# Patient Record
Sex: Female | Born: 1945 | Race: White | Hispanic: No | Marital: Married | Smoking: Never smoker
Health system: Southern US, Community
[De-identification: ages and names within clinical notes are randomized; demographics above are authoritative.]

## PROBLEM LIST (undated history)

## (undated) DIAGNOSIS — Z923 Personal history of irradiation: Secondary | ICD-10-CM

## (undated) DIAGNOSIS — Z9221 Personal history of antineoplastic chemotherapy: Secondary | ICD-10-CM

## (undated) DIAGNOSIS — T07XXXA Unspecified multiple injuries, initial encounter: Secondary | ICD-10-CM

## (undated) DIAGNOSIS — F32A Depression, unspecified: Secondary | ICD-10-CM

## (undated) DIAGNOSIS — Z9289 Personal history of other medical treatment: Secondary | ICD-10-CM

## (undated) DIAGNOSIS — F329 Major depressive disorder, single episode, unspecified: Secondary | ICD-10-CM

## (undated) DIAGNOSIS — D509 Iron deficiency anemia, unspecified: Secondary | ICD-10-CM

## (undated) DIAGNOSIS — I2510 Atherosclerotic heart disease of native coronary artery without angina pectoris: Secondary | ICD-10-CM

## (undated) DIAGNOSIS — R2681 Unsteadiness on feet: Secondary | ICD-10-CM

## (undated) DIAGNOSIS — E7800 Pure hypercholesterolemia, unspecified: Secondary | ICD-10-CM

## (undated) DIAGNOSIS — R011 Cardiac murmur, unspecified: Secondary | ICD-10-CM

## (undated) DIAGNOSIS — Z8719 Personal history of other diseases of the digestive system: Secondary | ICD-10-CM

## (undated) DIAGNOSIS — M1990 Unspecified osteoarthritis, unspecified site: Secondary | ICD-10-CM

## (undated) DIAGNOSIS — R51 Headache: Secondary | ICD-10-CM

## (undated) DIAGNOSIS — K219 Gastro-esophageal reflux disease without esophagitis: Secondary | ICD-10-CM

## (undated) DIAGNOSIS — R519 Headache, unspecified: Secondary | ICD-10-CM

## (undated) DIAGNOSIS — C50911 Malignant neoplasm of unspecified site of right female breast: Secondary | ICD-10-CM

## (undated) DIAGNOSIS — Z972 Presence of dental prosthetic device (complete) (partial): Secondary | ICD-10-CM

## (undated) DIAGNOSIS — F419 Anxiety disorder, unspecified: Secondary | ICD-10-CM

## (undated) DIAGNOSIS — J189 Pneumonia, unspecified organism: Secondary | ICD-10-CM

## (undated) DIAGNOSIS — K8020 Calculus of gallbladder without cholecystitis without obstruction: Secondary | ICD-10-CM

## (undated) DIAGNOSIS — I700 Atherosclerosis of aorta: Secondary | ICD-10-CM

## (undated) DIAGNOSIS — I951 Orthostatic hypotension: Secondary | ICD-10-CM

## (undated) DIAGNOSIS — D649 Anemia, unspecified: Secondary | ICD-10-CM

## (undated) DIAGNOSIS — I200 Unstable angina: Secondary | ICD-10-CM

## (undated) DIAGNOSIS — Z973 Presence of spectacles and contact lenses: Secondary | ICD-10-CM

## (undated) DIAGNOSIS — I10 Essential (primary) hypertension: Secondary | ICD-10-CM

## (undated) DIAGNOSIS — B029 Zoster without complications: Secondary | ICD-10-CM

## (undated) DIAGNOSIS — I219 Acute myocardial infarction, unspecified: Secondary | ICD-10-CM

## (undated) DIAGNOSIS — K449 Diaphragmatic hernia without obstruction or gangrene: Secondary | ICD-10-CM

## (undated) HISTORY — DX: Atherosclerosis of aorta: I70.0

## (undated) HISTORY — DX: Iron deficiency anemia, unspecified: D50.9

## (undated) HISTORY — PX: TOTAL KNEE ARTHROPLASTY: SHX125

## (undated) HISTORY — DX: Diaphragmatic hernia without obstruction or gangrene: K44.9

## (undated) HISTORY — PX: CARDIAC CATHETERIZATION: SHX172

## (undated) HISTORY — DX: Unstable angina: I20.0

## (undated) HISTORY — PX: BREAST BIOPSY: SHX20

## (undated) HISTORY — PX: TONSILLECTOMY: SUR1361

---

## 1998-07-16 ENCOUNTER — Other Ambulatory Visit: Admission: RE | Admit: 1998-07-16 | Discharge: 1998-07-16 | Payer: Self-pay | Admitting: Family Medicine

## 1998-08-05 ENCOUNTER — Emergency Department (HOSPITAL_COMMUNITY): Admission: EM | Admit: 1998-08-05 | Discharge: 1998-08-05 | Payer: Self-pay | Admitting: Emergency Medicine

## 1998-08-05 ENCOUNTER — Encounter: Payer: Self-pay | Admitting: Emergency Medicine

## 1999-07-13 ENCOUNTER — Encounter: Admission: RE | Admit: 1999-07-13 | Discharge: 1999-07-29 | Payer: Self-pay | Admitting: Orthopedic Surgery

## 1999-12-30 ENCOUNTER — Encounter: Admission: RE | Admit: 1999-12-30 | Discharge: 1999-12-30 | Payer: Self-pay | Admitting: Family Medicine

## 1999-12-30 ENCOUNTER — Encounter: Payer: Self-pay | Admitting: Family Medicine

## 2001-01-22 ENCOUNTER — Encounter: Payer: Self-pay | Admitting: Family Medicine

## 2001-01-22 ENCOUNTER — Encounter: Admission: RE | Admit: 2001-01-22 | Discharge: 2001-01-22 | Payer: Self-pay | Admitting: Family Medicine

## 2001-10-31 ENCOUNTER — Encounter: Payer: Self-pay | Admitting: Family Medicine

## 2001-10-31 ENCOUNTER — Encounter: Admission: RE | Admit: 2001-10-31 | Discharge: 2001-10-31 | Payer: Self-pay | Admitting: Family Medicine

## 2001-11-05 ENCOUNTER — Encounter: Admission: RE | Admit: 2001-11-05 | Discharge: 2001-11-05 | Payer: Self-pay | Admitting: Family Medicine

## 2001-11-05 ENCOUNTER — Encounter: Payer: Self-pay | Admitting: Family Medicine

## 2002-01-04 ENCOUNTER — Ambulatory Visit (HOSPITAL_BASED_OUTPATIENT_CLINIC_OR_DEPARTMENT_OTHER): Admission: RE | Admit: 2002-01-04 | Discharge: 2002-01-04 | Payer: Self-pay | Admitting: Orthopedic Surgery

## 2002-01-15 ENCOUNTER — Encounter: Admission: RE | Admit: 2002-01-15 | Discharge: 2002-02-28 | Payer: Self-pay | Admitting: Orthopedic Surgery

## 2003-07-08 ENCOUNTER — Encounter: Admission: RE | Admit: 2003-07-08 | Discharge: 2003-07-08 | Payer: Self-pay | Admitting: Family Medicine

## 2003-08-01 ENCOUNTER — Encounter: Admission: RE | Admit: 2003-08-01 | Discharge: 2003-08-01 | Payer: Self-pay | Admitting: Family Medicine

## 2004-03-08 ENCOUNTER — Other Ambulatory Visit: Admission: RE | Admit: 2004-03-08 | Discharge: 2004-03-08 | Payer: Self-pay | Admitting: Family Medicine

## 2004-04-20 ENCOUNTER — Encounter: Admission: RE | Admit: 2004-04-20 | Discharge: 2004-04-20 | Payer: Self-pay | Admitting: Family Medicine

## 2005-10-05 ENCOUNTER — Encounter: Admission: RE | Admit: 2005-10-05 | Discharge: 2005-10-05 | Payer: Self-pay | Admitting: Family Medicine

## 2005-11-21 ENCOUNTER — Encounter: Admission: RE | Admit: 2005-11-21 | Discharge: 2005-11-21 | Payer: Self-pay | Admitting: Family Medicine

## 2006-11-04 ENCOUNTER — Emergency Department (HOSPITAL_COMMUNITY): Admission: EM | Admit: 2006-11-04 | Discharge: 2006-11-04 | Payer: Self-pay | Admitting: *Deleted

## 2008-03-12 ENCOUNTER — Encounter: Admission: RE | Admit: 2008-03-12 | Discharge: 2008-03-12 | Payer: Self-pay | Admitting: Family Medicine

## 2008-07-03 ENCOUNTER — Ambulatory Visit (HOSPITAL_COMMUNITY): Admission: RE | Admit: 2008-07-03 | Discharge: 2008-07-03 | Payer: Self-pay | Admitting: Family Medicine

## 2008-07-14 ENCOUNTER — Emergency Department (HOSPITAL_COMMUNITY): Admission: EM | Admit: 2008-07-14 | Discharge: 2008-07-15 | Payer: Self-pay | Admitting: Emergency Medicine

## 2009-08-23 ENCOUNTER — Emergency Department (HOSPITAL_COMMUNITY): Admission: EM | Admit: 2009-08-23 | Discharge: 2009-08-23 | Payer: Self-pay | Admitting: Family Medicine

## 2009-12-30 ENCOUNTER — Encounter: Admission: RE | Admit: 2009-12-30 | Discharge: 2009-12-30 | Payer: Self-pay | Admitting: Gastroenterology

## 2010-02-24 ENCOUNTER — Inpatient Hospital Stay (HOSPITAL_COMMUNITY): Admission: RE | Admit: 2010-02-24 | Discharge: 2010-02-28 | Payer: Self-pay | Admitting: Orthopedic Surgery

## 2010-06-10 ENCOUNTER — Ambulatory Visit (HOSPITAL_COMMUNITY)
Admission: RE | Admit: 2010-06-10 | Discharge: 2010-06-10 | Payer: Self-pay | Source: Home / Self Care | Attending: Family Medicine | Admitting: Family Medicine

## 2010-06-15 ENCOUNTER — Encounter
Admission: RE | Admit: 2010-06-15 | Discharge: 2010-06-15 | Payer: Self-pay | Source: Home / Self Care | Attending: Family Medicine | Admitting: Family Medicine

## 2010-07-28 LAB — BASIC METABOLIC PANEL
BUN: 5 mg/dL — ABNORMAL LOW (ref 6–23)
Calcium: 8.5 mg/dL (ref 8.4–10.5)
Creatinine, Ser: 0.72 mg/dL (ref 0.4–1.2)
Creatinine, Ser: 0.73 mg/dL (ref 0.4–1.2)
GFR calc non Af Amer: >60 mL/min (ref >60)
GFR calc non Af Amer: >60 mL/min (ref >60)
Glucose, Bld: 104 mg/dL — ABNORMAL HIGH (ref 70–99)
Sodium: 137 mEq/L (ref 135–145)

## 2010-07-28 LAB — CBC
Hemoglobin: 10.9 g/dL — ABNORMAL LOW (ref 12.0–15.0)
MCHC: 33.2 g/dL (ref 30.0–36.0)
MCV: 91.7 fL (ref 78.0–100.0)
Platelets: 159 10*3/uL (ref 150–400)
Platelets: 186 10*3/uL (ref 150–400)
RDW: 13.3 % (ref 11.5–15.5)
RDW: 13.8 % (ref 11.5–15.5)
WBC: 8.3 10*3/uL (ref 4.0–10.5)

## 2010-07-29 LAB — BASIC METABOLIC PANEL
BUN: 7 mg/dL (ref 6–23)
CO2: 27 mEq/L (ref 19–32)
CO2: 30 mEq/L (ref 19–32)
Chloride: 100 mEq/L (ref 96–112)
Chloride: 104 mEq/L (ref 96–112)
Creatinine, Ser: 0.77 mg/dL (ref 0.4–1.2)
Creatinine, Ser: 0.79 mg/dL (ref 0.4–1.2)
GFR calc Af Amer: >60 mL/min (ref >60)
Glucose, Bld: 140 mg/dL — ABNORMAL HIGH (ref 70–99)
Sodium: 141 mEq/L (ref 135–145)

## 2010-07-29 LAB — CBC
Hemoglobin: 9.4 g/dL — ABNORMAL LOW (ref 12.0–15.0)
MCH: 28.9 pg (ref 26.0–34.0)
MCH: 29 pg (ref 26.0–34.0)
MCHC: 32.4 g/dL (ref 30.0–36.0)
MCV: 89.2 fL (ref 78.0–100.0)
MCV: 89.2 fL (ref 78.0–100.0)
MCV: 90.4 fL (ref 78.0–100.0)
Platelets: 182 10*3/uL (ref 150–400)
Platelets: 227 10*3/uL (ref 150–400)
RBC: 3.24 MIL/uL — ABNORMAL LOW (ref 3.87–5.11)
RBC: 3.89 MIL/uL (ref 3.87–5.11)
RDW: 13.3 % (ref 11.5–15.5)
WBC: 8.3 10*3/uL (ref 4.0–10.5)

## 2010-07-29 LAB — URINE MICROSCOPIC-ADD ON

## 2010-07-29 LAB — TYPE AND SCREEN
Antibody Screen: NEGATIVE
Unit division: 0

## 2010-07-29 LAB — URINALYSIS, ROUTINE W REFLEX MICROSCOPIC
Protein, ur: NEGATIVE mg/dL
Urobilinogen, UA: 0.2 mg/dL (ref 0.0–1.0)

## 2010-07-29 LAB — SURGICAL PCR SCREEN: Staphylococcus aureus: POSITIVE — AB

## 2010-07-29 LAB — COMPREHENSIVE METABOLIC PANEL
AST: 18 U/L (ref 0–37)
Albumin: 3.7 g/dL (ref 3.5–5.2)
Calcium: 8.8 mg/dL (ref 8.4–10.5)
Creatinine, Ser: 0.93 mg/dL (ref 0.4–1.2)
GFR calc Af Amer: >60 mL/min (ref >60)
Total Protein: 7.3 g/dL (ref 6.0–8.3)

## 2010-07-29 LAB — APTT: aPTT: 29 seconds (ref 24–37)

## 2010-08-04 LAB — URINE CULTURE

## 2010-08-04 LAB — POCT URINALYSIS DIP (DEVICE)
Protein, ur: NEGATIVE mg/dL
Specific Gravity, Urine: 1.015 (ref 1.005–1.030)
Urobilinogen, UA: 0.2 mg/dL (ref 0.0–1.0)

## 2010-10-01 NOTE — Op Note (Signed)
NAME:  Angela Bond, Angela Bond                           ACCOUNT NO.:  [id number]   MEDICAL RECORD NO.:  [id number]                   PATIENT TYPE:  AMB   LOCATION:  DSC                                  FACILITY:  [facility]   PHYSICIAN:  Loreta Ave, M.D.              DATE OF BIRTH:  [birth year]   DATE OF PROCEDURE:  01/04/2002  DATE OF DISCHARGE:                                 OPERATIVE REPORT   PREOPERATIVE DIAGNOSIS:  Medial meniscus tear with degenerative arthritis  and synovitis, left knee.   POSTOPERATIVE DIAGNOSES:  1. Medial meniscus tear with degenerative arthritis and synovitis, left     knee.  2. Grade 3 chondromalacia of the medial femoral condyle, also grade 3     changes, patellofemoral joint, mainly on the trochlea.   PROCEDURES:  1. Left knee examination under anesthesia, arthroscopy, with chondroplasty     of the medial femoral condyle, patellofemoral joint.  2. Extensive synovectomy.  3. Partial medial meniscectomy.   SURGEON:  Loreta Ave, M.D.   ASSISTANT:  Arlys John D. Petrarca, P.A.-C.   ANESTHESIA:  Knee block with sedation.   SPECIMENS:  None.   CULTURES:  None.   COMPLICATIONS:  None.   DRESSING:  Soft compressive.   DESCRIPTION OF PROCEDURE:  The patient brought to the operating room and  placed on the operating table in supine position.  After adequate anesthesia  had been obtained, left knee examined.  Full motion and good stability but  with patellofemoral tracking and obvious swelling and synovitis.  Tourniquet  and leg holder applied.  Leg prepped and draped in the usual sterile  fashion.  Three portals created, one superolateral, one each medial and  lateral parapatellar.  Inflow catheter introduced, knee distended,  arthroscope introduced, knee inspected.  Marked synovitis extending halfway  across the patellofemoral joint.  All of this resected.  Numerous chondral  loose bodies removed.  The patella had reasonable tracking, grade 2  changes  on the patella, but extensive grade 3 changes throughout the trochlea.  Flaps of cartilage debrided down to a stable surface.  Approaching grade 4  in the middle of the trochlea but good tracking.  Medial compartment  extensive degenerative tearing, posterior half of medial meniscus with  numerous displaced fragments.  Saucerized out, leaving a little bit of the  rim in the back __________  and into remaining meniscus.  Marked grade 3  changes over the entire weightbearing dome, medial femoral condyle, partial-  thickness.  Chondroplasty to a stable surface.  Lateral meniscus, lateral  compartment, cruciate ligaments normal.  The entire knee inspected and no  other findings appreciated.  Care taken to remove all chondral loose bodies.  Instruments and fluid removed.  Portals and the knee injected with Marcaine.  Portals closed with 4-0 nylon.  Sterile compressive dressing applied.  Anesthesia reversed, brought to the recovery room.  Tolerated the surgery  well with no complications.                                                Loreta Ave, M.D.    DFM/MEDQ  D:  01/04/2002  T:  01/08/2002  Job:  [id number]

## 2010-12-16 ENCOUNTER — Encounter (HOSPITAL_COMMUNITY)
Admission: RE | Admit: 2010-12-16 | Discharge: 2010-12-16 | Disposition: A | Discharge status: Home / Self Care | Payer: Medicare Other | Source: Ambulatory Visit | Attending: Orthopedic Surgery | Admitting: Orthopedic Surgery

## 2010-12-16 LAB — CBC
Hemoglobin: 11.6 g/dL — ABNORMAL LOW (ref 12.0–15.0)
MCH: 30 pg (ref 26.0–34.0)
RBC: 3.87 MIL/uL (ref 3.87–5.11)

## 2010-12-16 LAB — URINALYSIS, ROUTINE W REFLEX MICROSCOPIC
Glucose, UA: NEGATIVE mg/dL
Specific Gravity, Urine: 1.008 (ref 1.005–1.030)
pH: 6 (ref 5.0–8.0)

## 2010-12-16 LAB — COMPREHENSIVE METABOLIC PANEL
ALT: 15 U/L (ref 0–35)
Albumin: 3.5 g/dL (ref 3.5–5.2)
Alkaline Phosphatase: 75 U/L (ref 39–117)
GFR calc Af Amer: >60 mL/min (ref >60)
Glucose, Bld: 85 mg/dL (ref 70–99)
Potassium: 3.7 mEq/L (ref 3.5–5.1)
Sodium: 140 mEq/L (ref 135–145)
Total Protein: 7.3 g/dL (ref 6.0–8.3)

## 2010-12-16 LAB — SURGICAL PCR SCREEN: MRSA, PCR: POSITIVE — AB

## 2010-12-16 LAB — URINE MICROSCOPIC-ADD ON

## 2010-12-16 LAB — PROTIME-INR: Prothrombin Time: 13.7 seconds (ref 11.6–15.2)

## 2010-12-22 ENCOUNTER — Inpatient Hospital Stay (HOSPITAL_COMMUNITY): Payer: Medicare Other

## 2010-12-22 ENCOUNTER — Inpatient Hospital Stay (HOSPITAL_COMMUNITY)
Admission: RE | Admit: 2010-12-22 | Discharge: 2010-12-25 | DRG: 470 | Disposition: A | Discharge status: Home Health | Payer: Medicare Other | Source: Ambulatory Visit | Attending: Orthopedic Surgery | Admitting: Orthopedic Surgery

## 2010-12-22 DIAGNOSIS — I10 Essential (primary) hypertension: Secondary | ICD-10-CM | POA: Diagnosis present

## 2010-12-22 DIAGNOSIS — D62 Acute posthemorrhagic anemia: Secondary | ICD-10-CM | POA: Diagnosis not present

## 2010-12-22 DIAGNOSIS — E876 Hypokalemia: Secondary | ICD-10-CM | POA: Diagnosis not present

## 2010-12-22 DIAGNOSIS — Z01812 Encounter for preprocedural laboratory examination: Secondary | ICD-10-CM

## 2010-12-22 DIAGNOSIS — M1710 Unilateral primary osteoarthritis, unspecified knee: Principal | ICD-10-CM | POA: Diagnosis present

## 2010-12-22 DIAGNOSIS — K219 Gastro-esophageal reflux disease without esophagitis: Secondary | ICD-10-CM | POA: Diagnosis present

## 2010-12-22 LAB — URINALYSIS, ROUTINE W REFLEX MICROSCOPIC
Bilirubin Urine: NEGATIVE
Bilirubin Urine: NEGATIVE
Glucose, UA: NEGATIVE mg/dL
Hgb urine dipstick: NEGATIVE
Ketones, ur: NEGATIVE mg/dL
Protein, ur: NEGATIVE mg/dL
Urobilinogen, UA: 1 mg/dL (ref 0.0–1.0)
pH: 6 (ref 5.0–8.0)

## 2010-12-22 LAB — URINE MICROSCOPIC-ADD ON

## 2010-12-23 LAB — BASIC METABOLIC PANEL
BUN: 7 mg/dL (ref 6–23)
CO2: 29 mEq/L (ref 19–32)
Chloride: 104 mEq/L (ref 96–112)
Creatinine, Ser: 0.62 mg/dL (ref 0.50–1.10)
Glucose, Bld: 106 mg/dL — ABNORMAL HIGH (ref 70–99)

## 2010-12-23 LAB — HEMOGLOBIN AND HEMATOCRIT, BLOOD
HCT: 29.2 % — ABNORMAL LOW (ref 36.0–46.0)
Hemoglobin: 9.6 g/dL — ABNORMAL LOW (ref 12.0–15.0)

## 2010-12-23 LAB — CBC
HCT: 26.6 % — ABNORMAL LOW (ref 36.0–46.0)
Hemoglobin: 8.7 g/dL — ABNORMAL LOW (ref 12.0–15.0)
MCH: 29.9 pg (ref 26.0–34.0)
MCHC: 32.7 g/dL (ref 30.0–36.0)
MCV: 91.4 fL (ref 78.0–100.0)

## 2010-12-23 LAB — URINE CULTURE
Colony Count: 70000
Colony Count: NO GROWTH

## 2010-12-23 NOTE — Op Note (Signed)
Angela Bond, LEOPARD NO.:  [id number]  MEDICAL RECORD NO.:  [id number]  LOCATION:  5009                         FACILITY:  [facility]  PHYSICIAN:  Loreta Ave, M.D. DATE OF BIRTH:  [birth year]  DATE OF PROCEDURE:  12/22/2010 DATE OF DISCHARGE:                              OPERATIVE REPORT   PREOPERATIVE DIAGNOSIS:  Left knee end-stage degenerative arthritis, varus alignment.  POSTOPERATIVE DIAGNOSIS:  Left knee end-stage degenerative arthritis, varus alignment.  PROCEDURE:  Left knee modified minimally invasive total knee replacement with Stryker triathlon prosthesis.  Soft tissue balancing.  Cemented and pegged posterior stabilized #3 femoral component.  Cemented #3 tibial component and 9-mm polyethylene insert.  Resurfacing 29-mm patellar component.  SURGEON:  Loreta Ave, MD  ASSISTANT:  Zonia Kief, PA, present throughout the entire case necessary for timely completion of procedure.  ANESTHESIA:  General.  BLOOD LOSS:  Minimal.  SPECIMENS:  None.  CULTURES:  None.  COMPLICATIONS:  None.  DRESSINGS:  Soft compressive knee immobilizer.  DRAIN:  Hemovac x1.  TOURNIQUET TIME:  One hour.  DESCRIPTION OF PROCEDURE:  The patient was brought to the operating room, placed on the operating table in supine position.  After adequate anesthesia had been obtained, left knee examined.  Mild flexion contracture, varus alignment flexion 90 degrees.  Tourniquet applied. Prepped and draped in usual sterile fashion.  Exsanguinated with elevation and Esmarch, tourniquet inflated to 350 mmHg.  Straight incision above the patella down to the tibial tubercle.  Skin and subcutaneous tissue divided.  Medial arthrotomy vastus splitting preserving quad tendon.  Hemostasis with cautery.  Knee exposed.  Grade 4 change throughout.  Medial capsule release.  Remnants of menisci, cruciate ligaments, loose body spurs removed.  Intramedullary guide on the femur.  A  10-mm resection set at 5 degrees of valgus.  Using epicondylar axis, the femur size cut and fitted for posterior stabilized peg #3 component.  Attention turned to the tibia.  Extramedullary guide. A 3-degree posterior slope cut.  Resection below the defect medially. Once the cuts were complete, all recesses were cleared including posteriorly.  A wound irrigated.  Patella exposed posterior 9 mm removed.  Drilled sized and fitted for a 29-mm component.  Trials put in place.  #3 above and below, #9 insert, and a 29-mm patella.  Nicely balanced knee in flexion/extension.  Good mechanical axis.  Full motion. Good tracking.  Patella tracking was very pleased with.  Tibia was marked for rotation.  Trials were removed.  Tibia and hand reamed. Thoroughly irrigated with pulse lavage.  Cement prepared, placed on all components firmly seated.  Polyethylene attached to tibia knee reduced. Patella with a clamp.  Once cement hardened, the wound was irrigated again.  Hemovac placed, brought out through a separate stab wound. Arthrotomy closed #1 Vicryl.  Skin and subcutaneous tissue with Vicryl and staples.  Sterile compressive dressing applied.  Tourniquet de- inflated and was removed.  Knee immobilizer applied.  Anesthesia reversed.  Brought to recovery room.  Tolerated the surgery well.  There were no complications.     Loreta Ave, M.D.     DFM/MEDQ  D:  12/23/2010  T:  12/23/2010  Job:  [id number]  Electronically Signed by Mckinley Jewel M.D. on 12/23/2010 03:42:55 PM

## 2010-12-24 LAB — CBC
MCV: 91.8 fL (ref 78.0–100.0)
Platelets: 181 10*3/uL (ref 150–400)
RBC: 3.04 MIL/uL — ABNORMAL LOW (ref 3.87–5.11)
WBC: 9.4 10*3/uL (ref 4.0–10.5)

## 2010-12-24 LAB — BASIC METABOLIC PANEL
GFR calc Af Amer: >60 mL/min (ref >60)
GFR calc non Af Amer: >60 mL/min (ref >60)
Glucose, Bld: 124 mg/dL — ABNORMAL HIGH (ref 70–99)
Potassium: 4 mEq/L (ref 3.5–5.1)
Sodium: 138 mEq/L (ref 135–145)

## 2010-12-25 LAB — PROTIME-INR
INR: 1.58 — ABNORMAL HIGH (ref 0.00–1.49)
Prothrombin Time: 19.2 seconds — ABNORMAL HIGH (ref 11.6–15.2)

## 2010-12-25 LAB — BASIC METABOLIC PANEL
Chloride: 102 mEq/L (ref 96–112)
GFR calc Af Amer: >60 mL/min (ref >60)
Potassium: 3.6 mEq/L (ref 3.5–5.1)
Sodium: 139 mEq/L (ref 135–145)

## 2010-12-25 LAB — CBC
Hemoglobin: 8.9 g/dL — ABNORMAL LOW (ref 12.0–15.0)
RBC: 3 MIL/uL — ABNORMAL LOW (ref 3.87–5.11)

## 2011-03-02 LAB — CBC
HCT: 36.2
Hemoglobin: 12.3
WBC: 7.2

## 2011-03-02 LAB — I-STAT 8, (EC8 V) (CONVERTED LAB)
Acid-Base Excess: 5 — ABNORMAL HIGH
Bicarbonate: 30.1 — ABNORMAL HIGH
Chloride: 103
HCT: 39
Operator id: 279831
TCO2: 31
pCO2, Ven: 45.4
pH, Ven: 7.429 — ABNORMAL HIGH

## 2011-03-02 LAB — DIFFERENTIAL
Eosinophils Relative: 3
Lymphocytes Relative: 43
Lymphs Abs: 3
Monocytes Absolute: 0.5

## 2011-03-02 LAB — POCT CARDIAC MARKERS: CKMB, poc: <1 — ABNORMAL LOW

## 2011-03-02 LAB — URINALYSIS, ROUTINE W REFLEX MICROSCOPIC
Bilirubin Urine: NEGATIVE
Glucose, UA: NEGATIVE
Ketones, ur: NEGATIVE
Specific Gravity, Urine: 1.009
pH: 7.5

## 2011-03-02 LAB — URINE MICROSCOPIC-ADD ON

## 2011-03-02 LAB — POCT I-STAT CREATININE
Creatinine, Ser: 0.7
Operator id: 279831

## 2011-03-02 LAB — URINE CULTURE

## 2011-03-02 NOTE — Discharge Summary (Signed)
NAMEJUDIT, AWAD NO.:  [id number]  MEDICAL RECORD NO.:  [id number]  LOCATION:  5009                         FACILITY:  [facility]  PHYSICIAN:  Loreta Ave, M.D. DATE OF BIRTH:  December 11, 1945  DATE OF ADMISSION:  12/22/2010 DATE OF DISCHARGE:  12/25/2010                              DISCHARGE SUMMARY   FINAL DIAGNOSES: 1. 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 2. Hypertension. 3. Hypercholesterolemia. 4. Osteopenia. 5. Gastroesophageal reflux disease. 6. Depression/anxiety.  HISTORY OF PRESENT ILLNESS:  A 65 year old white female with history of end-stage DJD, left knee and chronic pain presented to our office for preop evaluation for left total knee replacement.  She had progressively worsening pain with failure to response with conservative treatment. Significant decrease in her daily activities due to the ongoing complaint.  HOSPITAL COURSE:  On December 21, 2010, the patient was taken to the [facility] OR and a left total knee replacement procedure was performed.  SURGEON:  Mckinley Jewel, MD  ASSISTANT:  Zonia Kief PA-C.  ANESTHESIA:  General with femoral nerve block.  ESTIMATED BLOOD LOSS:  Minimal.  SPECIMENS:  None.  CULTURES:  None.  TOURNIQUET TIME:  One hour.  One Hemovac drain placed.  There were no surgical or anesthesia complications and the patient was transferred to recovery in stable condition.  After recovering in PACU, the patient was then transferred to the Orthopedic Unit and pharmacy protocol Coumadin and Lovenox started for DVT prophylaxis.  On December 23, 2010, the patient complained of left knee pain.  Denied chest pain, shortness of breath.  Temperature 100.1, pulse 83, respirations 18, blood pressure 113/53.  Hemoglobin 8.7, potassium 2.8, INR 1.26.  Left knee dressing clean, dry, intact.  Calf nontender and neurovascularly intact.  Skin warm and dry.  Discontinued  Dilaudid IV due to some issues with oversedation, previous surgery.  Started Norco 5/325, 1 tablet p.o. q.4 h. p.r.n. for pain.  Replace potassium.  Recheck H and H later in the afternoon.  PT, OT consults.  On December 24, 2010, the patient doing well with left knee pain better.  Temperature 99.7, pulse 89, respirations 18, blood pressure 123/67.  Hemoglobin 9.1, sodium 138, potassium 4.0, chloride 106, CO2 27, BUN 6, creatinine 0.53, glucose 124, INR 1.46.  Preop urine culture showed no growth.  Left knee wound looks good and staples intact.  No drainage or signs of infection. Hemovac drain removed.  Saline locked IV.  December 25, 2010, the patient doing much better and progressing with therapy.  Hemoglobin 8.9, hematocrit 27.5, INR 1.58.  Knee wound looks good and staples intact. No drainage or signs of infection.  Calf nontender and neurovascularly intact.  She has progressed well with therapy and now ready to discharge home.  DISCHARGE MEDICATIONS: 1. Norco 7.5/325, 1-2 tabs p.o. q.4-6 h. p.r.n. for pain. 2. Robaxin 500 mg 1 tablet p.o. q.6 h. p.r.n. for spasms. 3. Lovenox 30 mg 1 subcu injection q.12 h. and stop when Coumadin is     therapeutic with INR 2-3. 4. Coumadin pharmacy protocol per home health agency.  CONDITION:  Good and stable.  DISPOSITION:  Discharged home.  INSTRUCTIONS:  The patient will continue to work with home health PT and OT to improve ambulation and knee range of motion and strengthening. Weight bear as tolerated.  Daily dressing changes with 4x4 gauze and apply TED hose over this.  She is okay to shower but no tub soaking. Can wean off walker to single prong cane as tolerated.  Coumadin x4 weeks postop DVT prophylaxis.  Follow up when she is 2 weeks postop for recheck and we will remove staples at that time.     Genene Churn. Denton Meek.   ______________________________ Loreta Ave, M.D.    JMO/MEDQ  D:  01/10/2011  T:  01/10/2011  Job:   [id number]  Electronically Signed by Zonia Kief P.A. on 02/23/2011 03:37:56 PM Electronically Signed by Mckinley Jewel M.D. on 03/02/2011 02:03:18 PM

## 2011-04-28 DIAGNOSIS — H04542 Stenosis of left lacrimal canaliculi: Secondary | ICD-10-CM | POA: Insufficient documentation

## 2011-04-28 DIAGNOSIS — F419 Anxiety disorder, unspecified: Secondary | ICD-10-CM | POA: Insufficient documentation

## 2011-05-17 HISTORY — PX: EYE SURGERY: SHX253

## 2011-07-26 ENCOUNTER — Other Ambulatory Visit: Payer: Self-pay | Admitting: Otolaryngology

## 2011-07-26 DIAGNOSIS — H93A9 Pulsatile tinnitus, unspecified ear: Secondary | ICD-10-CM

## 2011-08-03 ENCOUNTER — Ambulatory Visit
Admission: RE | Admit: 2011-08-03 | Discharge: 2011-08-03 | Disposition: A | Discharge status: Home / Self Care | Payer: Medicare Other | Source: Ambulatory Visit | Attending: Otolaryngology | Admitting: Otolaryngology

## 2011-08-03 DIAGNOSIS — H93A9 Pulsatile tinnitus, unspecified ear: Secondary | ICD-10-CM

## 2011-08-03 MED ORDER — GADOBENATE DIMEGLUMINE 529 MG/ML IV SOLN
16.0000 mL | Freq: Once | INTRAVENOUS | Status: AC | PRN
  Administered 2011-08-03: 16 mL via INTRAVENOUS

## 2012-01-13 ENCOUNTER — Other Ambulatory Visit: Payer: Self-pay | Admitting: Cardiology

## 2012-01-14 ENCOUNTER — Emergency Department (HOSPITAL_COMMUNITY)
Admission: EM | Admit: 2012-01-14 | Discharge: 2012-01-14 | Disposition: A | Discharge status: Home / Self Care | Payer: Medicare Other | Attending: Emergency Medicine | Admitting: Emergency Medicine

## 2012-01-14 ENCOUNTER — Emergency Department (HOSPITAL_COMMUNITY): Payer: Medicare Other

## 2012-01-14 ENCOUNTER — Encounter (HOSPITAL_COMMUNITY): Payer: Self-pay | Admitting: Adult Health

## 2012-01-14 DIAGNOSIS — R10819 Abdominal tenderness, unspecified site: Secondary | ICD-10-CM | POA: Insufficient documentation

## 2012-01-14 DIAGNOSIS — I10 Essential (primary) hypertension: Secondary | ICD-10-CM | POA: Insufficient documentation

## 2012-01-14 DIAGNOSIS — K449 Diaphragmatic hernia without obstruction or gangrene: Secondary | ICD-10-CM

## 2012-01-14 DIAGNOSIS — Z79899 Other long term (current) drug therapy: Secondary | ICD-10-CM | POA: Insufficient documentation

## 2012-01-14 DIAGNOSIS — E7800 Pure hypercholesterolemia, unspecified: Secondary | ICD-10-CM | POA: Insufficient documentation

## 2012-01-14 DIAGNOSIS — R109 Unspecified abdominal pain: Secondary | ICD-10-CM

## 2012-01-14 DIAGNOSIS — R197 Diarrhea, unspecified: Secondary | ICD-10-CM

## 2012-01-14 HISTORY — DX: Gastro-esophageal reflux disease without esophagitis: K21.9

## 2012-01-14 HISTORY — DX: Unspecified osteoarthritis, unspecified site: M19.90

## 2012-01-14 HISTORY — DX: Pure hypercholesterolemia, unspecified: E78.00

## 2012-01-14 HISTORY — DX: Essential (primary) hypertension: I10

## 2012-01-14 LAB — CBC WITH DIFFERENTIAL/PLATELET
Basophils Absolute: 0 10*3/uL (ref 0.0–0.1)
Eosinophils Relative: 1 % (ref 0–5)
Lymphocytes Relative: 24 % (ref 12–46)
Neutro Abs: 7.6 10*3/uL (ref 1.7–7.7)
Neutrophils Relative %: 68 % (ref 43–77)
Platelets: 216 10*3/uL (ref 150–400)
RBC: 4.2 MIL/uL (ref 3.87–5.11)
RDW: 13.8 % (ref 11.5–15.5)
WBC: 11.1 10*3/uL — ABNORMAL HIGH (ref 4.0–10.5)

## 2012-01-14 LAB — URINALYSIS, ROUTINE W REFLEX MICROSCOPIC
Nitrite: NEGATIVE
Protein, ur: NEGATIVE mg/dL
Specific Gravity, Urine: 1.015 (ref 1.005–1.030)
Urobilinogen, UA: 0.2 mg/dL (ref 0.0–1.0)

## 2012-01-14 LAB — HEPATIC FUNCTION PANEL
ALT: 13 U/L (ref 0–35)
AST: 19 U/L (ref 0–37)
Albumin: 3.5 g/dL (ref 3.5–5.2)
Alkaline Phosphatase: 71 U/L (ref 39–117)
Total Bilirubin: 0.3 mg/dL (ref 0.3–1.2)
Total Protein: 7.6 g/dL (ref 6.0–8.3)

## 2012-01-14 LAB — BASIC METABOLIC PANEL
CO2: 28 mEq/L (ref 19–32)
Calcium: 9.4 mg/dL (ref 8.4–10.5)
Chloride: 101 mEq/L (ref 96–112)
Potassium: 3.4 mEq/L — ABNORMAL LOW (ref 3.5–5.1)
Sodium: 140 mEq/L (ref 135–145)

## 2012-01-14 LAB — URINE MICROSCOPIC-ADD ON

## 2012-01-14 LAB — OCCULT BLOOD, POC DEVICE: Fecal Occult Bld: NEGATIVE

## 2012-01-14 MED ORDER — TRAMADOL HCL 50 MG PO TABS: 50.0000 mg | ORAL_TABLET | Freq: Four times a day (QID) | ORAL | Status: AC | PRN

## 2012-01-14 MED ORDER — IOHEXOL 300 MG/ML  SOLN: 20.0000 mL | INTRAMUSCULAR | Status: AC

## 2012-01-14 MED ORDER — DIPHENOXYLATE-ATROPINE 2.5-0.025 MG PO TABS: 1.0000 | ORAL_TABLET | Freq: Four times a day (QID) | ORAL | Status: AC | PRN

## 2012-01-14 MED ORDER — IOHEXOL 300 MG/ML  SOLN
100.0000 mL | Freq: Once | INTRAMUSCULAR | Status: AC | PRN
  Administered 2012-01-14: 100 mL via INTRAVENOUS

## 2012-01-14 MED ORDER — ONDANSETRON HCL 4 MG/2ML IJ SOLN
4.0000 mg | Freq: Once | INTRAMUSCULAR | Status: AC
  Administered 2012-01-14: 4 mg via INTRAVENOUS
  Filled 2012-01-14: qty 2

## 2012-01-14 MED ORDER — SODIUM CHLORIDE 0.9 % IV SOLN
Freq: Once | INTRAVENOUS | Status: AC
  Administered 2012-01-14: 1000 mL via INTRAVENOUS

## 2012-01-14 MED ORDER — MORPHINE SULFATE 4 MG/ML IJ SOLN
2.0000 mg | Freq: Once | INTRAMUSCULAR | Status: AC
  Administered 2012-01-14: 2 mg via INTRAVENOUS
  Filled 2012-01-14: qty 1

## 2012-01-14 NOTE — ED Notes (Signed)
Pt reports diarrhea, nausea, vomiting and abdominal pain that began this evening. C/o not feeling well for 2 days but tonight began the vomiting and diarrhea and pain. Reports abdominal distention and pain is worse in upper left quadrant.

## 2012-01-14 NOTE — ED Notes (Signed)
Pt c/o abdominal generalized pain onset 1400hrs 8.30.13 noted to be increased with movement, constant, throbbing associated with weakness, nausea and vomiting times two, loose stool times one.  Denies fever, chills, urinary symptoms.

## 2012-01-14 NOTE — ED Provider Notes (Signed)
History     CSN: [id number]  Arrival date & time 01/14/12  0304   First MD Initiated Contact with Patient 01/14/12 [phone]      Chief Complaint  Patient presents with  . Abdominal Pain    (Consider location/radiation/quality/duration/timing/severity/associated sxs/prior treatment) HPI Comments: 66 y/o female with hx of htn, GERD who presents with n/v/d X 1 day - states that she has felt generalized weakness X 3 days but in last 24 hours has had 2 episodes of vomiting followed by one episode of loose dark colored stools.  She denies rectal bleeding, back pain, fevers, chills, swelling, rashes. She has no history of abdominal surgery and denies use of anti-inflammatory medications.  Patient is a 66 y.o. female presenting with abdominal pain. The history is provided by the patient.  Abdominal Pain The primary symptoms of the illness include abdominal pain.    Past Medical History  Diagnosis Date  . Arthritis   . Hypertension   . GERD (gastroesophageal reflux disease)   . Hypercholesteremia     Past Surgical History  Procedure Date  . Total knee arthroplasty     History reviewed. No pertinent family history.  History  Substance Use Topics  . Smoking status: Never Smoker   . Smokeless tobacco: Not on file  . Alcohol Use: No    OB History    Grav Para Term Preterm Abortions TAB SAB Ect Mult Living                  Review of Systems  Gastrointestinal: Positive for abdominal pain.  All other systems reviewed and are negative.    Allergies  Review of patient's allergies indicates no known allergies.  Home Medications   Current Outpatient Rx  Name Route Sig Dispense Refill  . CALCIUM CARBONATE 600 MG PO TABS Oral Take 600 mg by mouth 2 (two) times daily with a meal.    . VITAMIN D 2000 UNITS PO TABS Oral Take 2,000 Units by mouth daily.    Marland Kitchen CITALOPRAM HYDROBROMIDE 40 MG PO TABS Oral Take 40 mg by mouth daily.    . ETODOLAC 500 MG PO TABS Oral Take 500 mg by mouth  2 (two) times daily.    Marland Kitchen FEXOFENADINE HCL 180 MG PO TABS Oral Take 180 mg by mouth daily.    Marland Kitchen LISINOPRIL 20 MG PO TABS Oral Take 20 mg by mouth daily.    Marland Kitchen LOVASTATIN 40 MG PO TABS Oral Take 40 mg by mouth at bedtime.    . OMEPRAZOLE 40 MG PO CPDR Oral Take 40 mg by mouth daily.    . RED YEAST RICE PO Oral Take 1 tablet by mouth daily.    Marland Kitchen DIPHENOXYLATE-ATROPINE 2.5-0.025 MG PO TABS Oral Take 1 tablet by mouth 4 (four) times daily as needed for diarrhea or loose stools. 30 tablet 0  . TRAMADOL HCL 50 MG PO TABS Oral Take 1 tablet (50 mg total) by mouth every 6 (six) hours as needed for pain. 15 tablet 0    BP 130/72  Pulse 53  Temp 98.9 F (37.2 C) (Oral)  Resp 16  SpO2 98%  Physical Exam  Nursing note and vitals reviewed. Constitutional: She appears well-developed and well-nourished. No distress.  HENT:  Head: Normocephalic and atraumatic.  Mouth/Throat: Oropharynx is clear and moist. No oropharyngeal exudate.  Eyes: Conjunctivae and EOM are normal. Pupils are equal, round, and reactive to light. Right eye exhibits no discharge. Left eye exhibits no discharge. No scleral  icterus.  Neck: Normal range of motion. Neck supple. No JVD present. No thyromegaly present.  Cardiovascular: Normal rate, regular rhythm, normal heart sounds and intact distal pulses.  Exam reveals no gallop and no friction rub.   No murmur heard. Pulmonary/Chest: Effort normal and breath sounds normal. No respiratory distress. She has no wheezes. She has no rales.  Abdominal: Soft. Bowel sounds are normal. She exhibits no distension and no mass. There is tenderness ( Focal tenderness to palpation in the epigastrium and left upper quadrant, no peritoneal signs, no pain in the lower abdomen.).  Genitourinary:       Chaperone present for rectal exam, normal rectal tone, no fissures, no hemorrhoids, normal brown stool in the rectal vault.  Musculoskeletal: Normal range of motion. She exhibits no edema and no  tenderness.  Lymphadenopathy:    She has no cervical adenopathy.  Neurological: She is alert. Coordination normal.  Skin: Skin is warm and dry. No rash noted. No erythema.  Psychiatric: She has a normal mood and affect. Her behavior is normal.    ED Course  Procedures (including critical care time)  Labs Reviewed  CBC WITH DIFFERENTIAL - Abnormal; Notable for the following:    WBC 11.1 (*)     All other components within normal limits  BASIC METABOLIC PANEL - Abnormal; Notable for the following:    Potassium 3.4 (*)     Glucose, Bld 103 (*)     GFR calc non Af Amer 86 (*)     All other components within normal limits  URINALYSIS, ROUTINE W REFLEX MICROSCOPIC - Abnormal; Notable for the following:    APPearance CLOUDY (*)     Bilirubin Urine LARGE (*)     Leukocytes, UA MODERATE (*)     All other components within normal limits  URINE MICROSCOPIC-ADD ON - Abnormal; Notable for the following:    Squamous Epithelial / LPF FEW (*)     Bacteria, UA FEW (*)     Casts HYALINE CASTS (*)     All other components within normal limits  OCCULT BLOOD, POC DEVICE  LIPASE, BLOOD  HEPATIC FUNCTION PANEL   Ct Abdomen Pelvis W Contrast  01/14/2012  *RADIOLOGY REPORT*  Clinical Data: Abdominal pain, more in the left upper quadrant. Vomiting and diarrhea.  CT ABDOMEN AND PELVIS WITH CONTRAST  Technique:  Multidetector CT imaging of the abdomen and pelvis was performed following the standard protocol during bolus administration of intravenous contrast.  Contrast: OMNIPAQUE IOHEXOL 300 MG/ML  SOLN 11/21/2005.  Comparison: 11/21/2005.  Findings: A large hiatal hernia is again demonstrated.  This has increased significantly in size, currently containing the entire stomach as well as bowel loops without obstruction.  Small probable cyst in the inferior aspect of the spleen. Unremarkable liver, gallbladder, adrenal glands, kidneys, urinary bladder, uterus and ovaries.  No enlarged lymph nodes.  There  is some coarse calcification in the tail of the pancreas without a visible mass.  This is less prominent than on the previous examination.  The lung bases are clear.  Mild lumbar and lower thoracic spine degenerative changes.  IMPRESSION:  1.  No acute abnormality. 2.  Very large hiatal hernia containing the entire stomach as well as bowel loops, without obstruction.   Original Report Authenticated By: Darrol Angel, M.D.      1. Abdominal pain   2. Hiatal hernia   3. Diarrhea       MDM  Overall the patient appears in no acute  distress though she does have significant tenderness in her upper abdomen. Her stool does not appear to be grossly bloody, check Hemoccults, evaluate for pancreatitis, cholecystitis with laboratory data as well as a CT scan to rule out other sources of pain and mild distention such as obstruction.   Reevaluation, the patient's abdomen is soft, minimally tender in the upper abdomen, labs have been reviewed showing no significant findings other than mild leukocytosis. Her CT scan shows a large hiatal hernia, no other acute findings, these have been discussed with patient and she will followup as outpatient or return for worsening symptoms.  Discharge Prescriptions include:  Naprosyn Lomotil   Vida Roller, MD 01/14/12 [phone]

## 2012-01-17 ENCOUNTER — Encounter (HOSPITAL_BASED_OUTPATIENT_CLINIC_OR_DEPARTMENT_OTHER): Admission: RE | Payer: Self-pay | Source: Ambulatory Visit

## 2012-01-17 ENCOUNTER — Inpatient Hospital Stay (HOSPITAL_BASED_OUTPATIENT_CLINIC_OR_DEPARTMENT_OTHER): Admission: RE | Admit: 2012-01-17 | Payer: Medicare Other | Source: Ambulatory Visit | Admitting: Cardiology

## 2012-01-17 SURGERY — JV LEFT HEART CATHETERIZATION WITH CORONARY ANGIOGRAM
Anesthesia: Moderate Sedation

## 2012-01-20 NOTE — H&P (Signed)
Subjective:     CC:    1. PER DR ROBERT EHINGER FOLLOWUP TO DISCUSS FURTHER TESTING. 2. Pt. states she had scarlet fever as a baby and almost died.        HPI:  General:  66-year-old with hypertension, hyperlipidemia who is here for reevaluation of significant dyspnea on exertion and chest pain with exertion. She feels diaphoretic with this exertion and her stress test previously done demonstrated poor exercise tolerance. Her husband has multiple stents/CAD. She is worried about exercising once again and wants to make sure that this is safe to do so. Her LDL cholesterol at last check was 125. Electrolytes were normal. Echocardiogram demonstrated normal ejection fraction, mild mitral regurgitation in 07/21/11. Nuclear stress test showed no ischemia but she only exercised for 3 minutes and 30 seconds due to poor exercise tolerance. Chest x-ray showed no acute abnormalities. EKG showed sinus rhythm without any changes.   She continues to have worrisome symptoms of chest pain/dyspnea on exertion/diaphoresis which are anginal in quality. .        ROS:  Dosing to be, no bleeding, no orthopnea, no dizziness, no rashes, no dysphasia, or strokelike symptoms, all others negative.       Medical History: Hypertension, Hypercholesterolemia, Osteopenia, Allergy, Depression, Anxiety, GERD.        Surgical History: Esophagus stretched , breast biopsy , torn cartilage in knee , Right TKR 2011, Tear duct surgery 11/2010 & 04/2011, Left TKR 12/2010.        Family History: Father: CAD, Passed away at 65 of MI Mother: CAD, CHF Sister 1: CAD 58 CABG  Negative GI family history.       Social History:  General:  History of smoking cigarettes: Never smoked.  no Smoking.  no Alcohol.  no Recreational drug use.  Marital Status: married.        Medications: Etodolac 500 MG Tablet 1 tablet Twice a day, Fexofenadine HCl 180 MG Tablet 1 tablet Once a day for allergies, MiraLax . Powder 1 capful mixed with 8  ounces of fluid as needed, Saline Nasal Spray 0.65 % Solution 2 pufffs as needed, Calcium 600 MG Tablet 1 tablet Twice a day for bone strength (OTC), Xanax 0.25 MG Tablet 1/2 to 1 tablet once a day as needed for anxiety, Lisinopril 20 MG Tablet 1 tablet Once a day for BP, Red Yeast Rice Extract 600 MG Capsule 1 capsule once a day for cholesterol, Vitamin D3 2000 UNIT Capsule 1 capsule once a day, Citalopram Hydrobromide 40 MG Tablet 1 tablet onec a day, Omeprazole 40 MG Capsule Delayed Release 1 capsule Once a day for acid reflux, Econazole Nitrate 1 % Cream 1 application to affected area twice a day, Lovastatin 40 MG Tablet TAKE 1 TABLET BY MOUTH EVERY DAY FOR CHOLESTEROL , Medication List reviewed and reconciled with the patient       Allergies: Sulfa drugs: Nausea: Allergy, Zocor: rash: Allergy, Pravachol: GI sx: Allergy, WelChol: Stomach cramps: Allergy, Evista: Leg aches: Allergy, Cipro: vomiting: Allergy, Fosamax: leg aches, Aspirin: burns stomach, Aleve, Doxycycline Hyclate: vomiting.       Objective:     Vitals: Wt 178, Wt change 6 lb, Ht 59.5, BMI 35.35, Pulse sitting 72, BP sitting 150/98.       Examination:  General Examination:  GENERAL APPEARANCE alert, oriented, NAD, pleasant.  SKIN: normal, no rash.  HEENT: normal.  HEAD: [place]/AT.  EYES: EOMI, Conjunctiva clear.  NECK: supple, FROM, without evidence of thyromegaly, adenopathy, or bruits, no   jugular venous distention (JVD).  LUNGS: clear to auscultation bilaterally, no wheezes, rhonchi, rales, regular breathing rate and effort.  HEART: regular rate and rhythm, no S3, S4, murmur or rub, point of maximul impulse (PMI) normal.  ABDOMEN: soft, non-tender/non-distended, bowel sounds present, no masses palpated, no bruit.  EXTREMITIES: no clubbing, no edema, pulses 2 plus bilaterally.  NEUROLOGIC EXAM: non-focal exam, alert and oriented x 3.  PERIPHERAL PULSES: normal (2+) bilaterally.  LYMPH NODES: no cervical adenopathy.  PSYCH  affect normal.        Assessment:     Assessment:  1. Dyspnea on exertion - 786.09 (Primary)  2. Chest pain on exertion - 786.50  3. Hypercholesteremia, pure - 272.0  4. Essential hypertension, benign - 401.1    Plan:     1. Dyspnea on exertion  LAB: Basic Metabolic creatinine 0.70    GLUCOSE 70 70-99 - mg/dL    BUN 9 6-26 - mg/dL    CREATININE 0.78 0.60-1.30 - mg/dl    eGFR (NON-AFRICAN AMERICAN) 74 >60 - calc    eGFR (AFRICAN AMERICAN) 89 >60 - calc    SODIUM 142 136-145 - mmol/L    POTASSIUM 4.0 3.5-5.5 - mmol/L    CHLORIDE 104 98-107 - mmol/L    C02 33 22-32 - mg/dL H   ANION GAP 9.1 6.0-20.0 - mmol/L    CALCIUM 9.0 8.6-10.3 - mg/dL     SKAINS,MARK 01/13/2012 01:21:17 PM > okay for catheterization    LAB: CBC with Diff hemoglobin 12.1    WBC 7.1 4.0-11.0 - K/ul    RBC 4.10 4.20-5.40 - M/uL L   HGB 12.1 12.0-16.0 - g/dL    HCT 36.6 37.0-47.0 - % L   MCH 29.4 27.0-33.0 - pg    MPV 9.4 7.5-10.7 - fL    MCV 89.3 81.0-99.0 - fL    MCHC 32.9 32.0-36.0 - g/dL    RDW 14.1 11.5-15.5 - %    NRBC# 0.00 -    PLT 192 150-400 - K/uL    NEUT % 60.7 43.3-71.9 - %    NRBC% 0.00 - %    LYMPH% 29.4 16.8-43.5 - %    MONO % 7.5 4.6-12.4 - %    EOS % 1.9 0.0-7.8 - %    BASO % 0.5 0.0-1.0 - %    NEUT # 4.3 1.9-7.2 - K/uL    LYMPH# 2.10 1.10-2.70 - K/uL    MONO # 0.5 0.3-0.8 - K/uL    EOS # 0.1 0.0-0.6 - K/uL    BASO # 0.0 0.0-0.1 - K/uL     SKAINS,MARK 01/13/2012 01:20:56 PM > okay for catheterization    LAB: PT (Prothrombin Time) (005199) normal    Prothrombin Time 11.0 9.1-12.0 - SEC    INR 1.0 0.8-1.2 -     SKAINS,MARK 01/13/2012 04:40:33 PM > okay for catheterization    With her continued symptoms, increasing severity, I will proceed with cardiac catheterization, radial approach optimal, JV lab. Risks and benefits of cardiac catheterization have been reviewed including risk of stroke, heart attack, death, bleeding, renal impariment and arterial damage. There was ample  oppurtuny to answer questions. Alternatives were discussed. Patient understands and wishes to proceed. There is certainly a possibility that her lack of perfusion defect may be secondary to triple-vessel disease. Her overall exercise time was poor. Her continued symptoms of chest pain/dyspnea on exertion continue to be worrisome for possible coronary disease.       2. Hypercholesteremia, pure  No changes   made. Continue with diet.       3. Essential hypertension, benign  Blood pressure fairly well controlled, elevated today. No changes made.        Immunizations:        Labs:        Procedure Codes: 80048 ECL BMP, 85025 ECL CBC PLATELET DIFF, 36415 BLOOD COLLECTION ROUTINE VENIPUNCTURE       Preventive:         Follow Up: post cath       

## 2012-01-23 ENCOUNTER — Inpatient Hospital Stay (HOSPITAL_BASED_OUTPATIENT_CLINIC_OR_DEPARTMENT_OTHER)
Admission: RE | Admit: 2012-01-23 | Discharge: 2012-01-23 | Disposition: A | Discharge status: Home / Self Care | Payer: Medicare Other | Source: Ambulatory Visit | Attending: Cardiology | Admitting: Cardiology

## 2012-01-23 ENCOUNTER — Encounter (HOSPITAL_BASED_OUTPATIENT_CLINIC_OR_DEPARTMENT_OTHER)
Admission: RE | Disposition: A | Discharge status: Home / Self Care | Payer: Self-pay | Source: Ambulatory Visit | Attending: Cardiology

## 2012-01-23 DIAGNOSIS — I2510 Atherosclerotic heart disease of native coronary artery without angina pectoris: Secondary | ICD-10-CM | POA: Insufficient documentation

## 2012-01-23 DIAGNOSIS — I10 Essential (primary) hypertension: Secondary | ICD-10-CM | POA: Insufficient documentation

## 2012-01-23 DIAGNOSIS — R61 Generalized hyperhidrosis: Secondary | ICD-10-CM | POA: Insufficient documentation

## 2012-01-23 DIAGNOSIS — R079 Chest pain, unspecified: Secondary | ICD-10-CM | POA: Insufficient documentation

## 2012-01-23 DIAGNOSIS — I200 Unstable angina: Secondary | ICD-10-CM | POA: Diagnosis present

## 2012-01-23 DIAGNOSIS — R0609 Other forms of dyspnea: Secondary | ICD-10-CM | POA: Insufficient documentation

## 2012-01-23 DIAGNOSIS — R0989 Other specified symptoms and signs involving the circulatory and respiratory systems: Secondary | ICD-10-CM | POA: Insufficient documentation

## 2012-01-23 DIAGNOSIS — E785 Hyperlipidemia, unspecified: Secondary | ICD-10-CM | POA: Insufficient documentation

## 2012-01-23 HISTORY — DX: Unstable angina: I20.0

## 2012-01-23 SURGERY — JV LEFT HEART CATHETERIZATION WITH CORONARY ANGIOGRAM
Anesthesia: Moderate Sedation

## 2012-01-23 MED ORDER — ACETAMINOPHEN 325 MG PO TABS: 650.0000 mg | ORAL_TABLET | ORAL | Status: DC | PRN

## 2012-01-23 MED ORDER — SODIUM CHLORIDE 0.9 % IV SOLN
INTRAVENOUS | Status: DC
  Administered 2012-01-23: 09:00:00 via INTRAVENOUS

## 2012-01-23 MED ORDER — SODIUM CHLORIDE 0.9 % IV SOLN: 1.0000 mL/kg/h | INTRAVENOUS | Status: AC

## 2012-01-23 MED ORDER — ONDANSETRON HCL 4 MG/2ML IJ SOLN: 4.0000 mg | Freq: Four times a day (QID) | INTRAMUSCULAR | Status: DC | PRN

## 2012-01-23 NOTE — H&P (View-Only) (Signed)
Subjective:     CC:    1. PER DR ROBERT EHINGER FOLLOWUP TO DISCUSS FURTHER TESTING. 2. Pt. states she had scarlet fever as a baby and almost died.        HPI:  General:  66 year old with hypertension, hyperlipidemia who is here for reevaluation of significant dyspnea on exertion and chest pain with exertion. She feels diaphoretic with this exertion and her stress test previously done demonstrated poor exercise tolerance. Her husband has multiple stents/CAD. She is worried about exercising once again and wants to make sure that this is safe to do so. Her LDL cholesterol at last check was 125. Electrolytes were normal. Echocardiogram demonstrated normal ejection fraction, mild mitral regurgitation in 07/21/11. Nuclear stress test showed no ischemia but she only exercised for 3 minutes and 30 seconds due to poor exercise tolerance. Chest x-ray showed no acute abnormalities. EKG showed sinus rhythm without any changes.   She continues to have worrisome symptoms of chest pain/dyspnea on exertion/diaphoresis which are anginal in quality. .        ROS:  Dosing to be, no bleeding, no orthopnea, no dizziness, no rashes, no dysphasia, or strokelike symptoms, all others negative.       Medical History: Hypertension, Hypercholesterolemia, Osteopenia, Allergy, Depression, Anxiety, GERD.        Surgical History: Esophagus stretched , breast biopsy , torn cartilage in knee , Right TKR 2011, Tear duct surgery 11/2010 & 04/2011, Left TKR 12/2010.        Family History: Father: CAD, Passed away at 44 of MI Mother: CAD, CHF Sister 1: CAD 72 CABG  Negative GI family history.       Social History:  General:  History of smoking cigarettes: Never smoked.  no Smoking.  no Alcohol.  no Recreational drug use.  Marital Status: married.        Medications: Etodolac 500 MG Tablet 1 tablet Twice a day, Fexofenadine HCl 180 MG Tablet 1 tablet Once a day for allergies, MiraLax . Powder 1 capful mixed with 8  ounces of fluid as needed, Saline Nasal Spray 0.65 % Solution 2 pufffs as needed, Calcium 600 MG Tablet 1 tablet Twice a day for bone strength (OTC), Xanax 0.25 MG Tablet 1/2 to 1 tablet once a day as needed for anxiety, Lisinopril 20 MG Tablet 1 tablet Once a day for BP, Red Yeast Rice Extract 600 MG Capsule 1 capsule once a day for cholesterol, Vitamin D3 2000 UNIT Capsule 1 capsule once a day, Citalopram Hydrobromide 40 MG Tablet 1 tablet onec a day, Omeprazole 40 MG Capsule Delayed Release 1 capsule Once a day for acid reflux, Econazole Nitrate 1 % Cream 1 application to affected area twice a day, Lovastatin 40 MG Tablet TAKE 1 TABLET BY MOUTH EVERY DAY FOR CHOLESTEROL , Medication List reviewed and reconciled with the patient       Allergies: Sulfa drugs: Nausea: Allergy, Zocor: rash: Allergy, Pravachol: GI sx: Allergy, WelChol: Stomach cramps: Allergy, Evista: Leg aches: Allergy, Cipro: vomiting: Allergy, Fosamax: leg aches, Aspirin: burns stomach, Aleve, Doxycycline Hyclate: vomiting.       Objective:     Vitals: Wt 178, Wt change 6 lb, Ht 59.5, BMI 35.35, Pulse sitting 72, BP sitting 150/98.       Examination:  General Examination:  GENERAL APPEARANCE alert, oriented, NAD, pleasant.  SKIN: normal, no rash.  HEENT: normal.  HEAD: /AT.  EYES: EOMI, Conjunctiva clear.  NECK: supple, FROM, without evidence of thyromegaly, adenopathy, or bruits, no  jugular venous distention (JVD).  LUNGS: clear to auscultation bilaterally, no wheezes, rhonchi, rales, regular breathing rate and effort.  HEART: regular rate and rhythm, no S3, S4, murmur or rub, point of maximul impulse (PMI) normal.  ABDOMEN: soft, non-tender/non-distended, bowel sounds present, no masses palpated, no bruit.  EXTREMITIES: no clubbing, no edema, pulses 2 plus bilaterally.  NEUROLOGIC EXAM: non-focal exam, alert and oriented x 3.  PERIPHERAL PULSES: normal (2+) bilaterally.  LYMPH NODES: no cervical adenopathy.  PSYCH  affect normal.        Assessment:     Assessment:  1. Dyspnea on exertion - 786.09 (Primary)  2. Chest pain on exertion - 786.50  3. Hypercholesteremia, pure - 272.0  4. Essential hypertension, benign - 401.1    Plan:     1. Dyspnea on exertion  LAB: Basic Metabolic creatinine 0.70    GLUCOSE 70 70-99 - mg/dL    BUN 9 4-78 - mg/dL    CREATININE 2.95 6.21-3.08 - mg/dl    eGFR (NON-AFRICAN AMERICAN) 74 >60 - calc    eGFR (AFRICAN AMERICAN) 89 >60 - calc    SODIUM 142 136-145 - mmol/L    POTASSIUM 4.0 3.5-5.5 - mmol/L    CHLORIDE 104 98-107 - mmol/L    C02 33 22-32 - mg/dL H   ANION GAP 9.1 6.5-78.4 - mmol/L    CALCIUM 9.0 8.6-10.3 - mg/dL     SKAINS,MARK 69/62/9528 01:21:17 PM > okay for catheterization    LAB: CBC with Diff hemoglobin 12.1    WBC 7.1 4.0-11.0 - K/ul    RBC 4.10 4.20-5.40 - M/uL L   HGB 12.1 12.0-16.0 - g/dL    HCT 41.3 24.4-01.0 - % L   MCH 29.4 27.0-33.0 - pg    MPV 9.4 7.5-10.7 - fL    MCV 89.3 81.0-99.0 - fL    MCHC 32.9 32.0-36.0 - g/dL    RDW 27.2 53.6-64.4 - %    NRBC# 0.00 -    PLT 192 150-400 - K/uL    NEUT % 60.7 43.3-71.9 - %    NRBC% 0.00 - %    LYMPH% 29.4 16.8-43.5 - %    MONO % 7.5 4.6-12.4 - %    EOS % 1.9 0.0-7.8 - %    BASO % 0.5 0.0-1.0 - %    NEUT # 4.3 1.9-7.2 - K/uL    LYMPH# 2.10 1.10-2.70 - K/uL    MONO # 0.5 0.3-0.8 - K/uL    EOS # 0.1 0.0-0.6 - K/uL    BASO # 0.0 0.0-0.1 - K/uL     SKAINS,MARK 01/13/2012 01:20:56 PM > okay for catheterization    LAB: PT (Prothrombin Time) (034742) normal    Prothrombin Time 11.0 9.1-12.0 - SEC    INR 1.0 0.8-1.2 -     SKAINS,MARK 01/13/2012 04:40:33 PM > okay for catheterization    With her continued symptoms, increasing severity, I will proceed with cardiac catheterization, radial approach optimal, JV lab. Risks and benefits of cardiac catheterization have been reviewed including risk of stroke, heart attack, death, bleeding, renal impariment and arterial damage. There was ample  oppurtuny to answer questions. Alternatives were discussed. Patient understands and wishes to proceed. There is certainly a possibility that her lack of perfusion defect may be secondary to triple-vessel disease. Her overall exercise time was poor. Her continued symptoms of chest pain/dyspnea on exertion continue to be worrisome for possible coronary disease.       2. Hypercholesteremia, pure  No changes  made. Continue with diet.       3. Essential hypertension, benign  Blood pressure fairly well controlled, elevated today. No changes made.        Immunizations:        Labs:        Procedure Codes: 78295 ECL BMP, 85025 ECL CBC PLATELET DIFF, 62130 BLOOD COLLECTION ROUTINE VENIPUNCTURE       Preventive:         Follow Up: post cath

## 2012-01-23 NOTE — Interval H&P Note (Signed)
History and Physical Interval Note:  01/23/2012 9:36 AM  Angela Bond  has presented today for surgery, with the diagnosis of cp  The various methods of treatment have been discussed with the patient and family. After consideration of risks, benefits and other options for treatment, the patient has consented to  Procedure(s) (LRB) with comments: JV LEFT HEART CATHETERIZATION WITH CORONARY ANGIOGRAM (N/A) as a surgical intervention .  The patient's history has been reviewed, patient examined, no change in status, stable for surgery.  I have reviewed the patient's chart and labs.  Questions were answered to the patient's satisfaction.     SKAINS, MARK  Originally, [place] [facility] denied cardiac catheterization but after apeal letter they granted cardiac catheterization. At the time of denial, they would not even allow me to discuss the case with the medical director. I was highly concerned given her symptoms. I was willing to spend time on the phone to discuss the case but they refused and stated a letter of apeal was the only method. I called Angela Bond to let her know of the situation. After approximately 3 days, a call was made to our clinic from [place] [facility] allowing cardiac catheterization to be performed. She's been having ongoing symptoms. I described to her that I must exclude triple-vessel disease.

## 2012-01-23 NOTE — Progress Notes (Signed)
TR band removed, tegaderm dressing applied and right wrist immobilizer.  Discharged to home.

## 2012-01-23 NOTE — CV Procedure (Signed)
CARDIAC CATHETERIZATION  PROCEDURE:  Left heart catheterization with selective coronary angiography, left ventriculogram via the radial artery approach.  INDICATIONS:  66 year old female with ongoing symptoms concerning for angina. Recent nuclear stress test with poor exercise tolerance 3 minutes and 30 seconds with no perfusion defects. Normal EF.  The risks, benefits, and details of the procedure were explained to the patient, including possibilities of stroke, heart attack, death, renal impairment, arterial damage, bleeding.  The patient verbalized understanding and wanted to proceed.  Informed written consent was obtained.  PROCEDURE TECHNIQUE:  Allen's test was performed pre-and post procedure and was normal. The right radial artery site was prepped and draped in a sterile fashion. One percent lidocaine was used for local anesthesia. Using the modified Seldinger technique a 5 French hydrophilic sheath was inserted into the radial artery without difficulty. 3 mg of verapamil was administered via the sheath. A Judkins right #4 catheter with the guidance of a Versicore wire was placed in the right coronary cusp. Unable to selectively cannulate the right coronary artery. A multipurpose was unsuccessful. Multiple attempts were made. 200 mg of nitroglycerin was administered via the sheath. During the procedure she stated she had mild upper arm discomfort. This was alleviated with added sedation. Catheter movement was normal. A no torque catheter was then used and successful. and selectively cannulated the right coronary artery. After traversing the aortic arch, 4000 units of heparin IV was administered. A Judkins left #3.5 catheter was used to selectively cannulate the left main artery. Multiple views with hand injection of Omnipaque were obtained. Catheter a pigtail catheter was used to cross into the left ventricle, hemodynamics were obtained, and a left ventriculogram was performed in the RAO position with  power injection. Following the procedure, sheath was removed, patient was hemodynamically stable, hemostasis was maintained with a Terumo T band.   CONTRAST:  Total of 110 ml.    FLOUROSCOPY TIME: 10 min.  COMPLICATIONS:  None.    HEMODYNAMICS:  Aortic pressure was 125/71mmHg; LV systolic pressure was ; LVEDP .  There was no significant  gradient between the left ventricle and aorta.    ANGIOGRAPHIC DATA:    Left main: No angiographically significant disease.  Left anterior descending (LAD): 30% mid LAD at the bifurcation of the first diagonal branch but no flow limiting disease present.  Circumflex artery (CIRC): Fairly small caliber artery with 2 obtuse marginal branches. No flow limiting disease. Small ramus branch.  Right coronary artery (RCA): Gives rise to the posterior descending artery, dominant vessel. Small distal branches as is seen in other coronary arteries. No flow limiting disease.  LEFT VENTRICULOGRAM:  Left ventricular angiogram was done in the 30 RAO projection and revealed normal left ventricular wall motion and systolic function with an estimated ejection fraction of 60 %.   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 Normal left ventricular systolic function.  LVEDP 20 mmHg.  Ejection fraction 60%.  RECOMMENDATION:  Continue with medical management. Reassurance.

## 2012-02-07 DIAGNOSIS — IMO0001 Reserved for inherently not codable concepts without codable children: Secondary | ICD-10-CM | POA: Insufficient documentation

## 2012-02-07 DIAGNOSIS — H11829 Conjunctivochalasis, unspecified eye: Secondary | ICD-10-CM | POA: Insufficient documentation

## 2012-03-12 DIAGNOSIS — H04559 Acquired stenosis of unspecified nasolacrimal duct: Secondary | ICD-10-CM | POA: Insufficient documentation

## 2012-05-22 ENCOUNTER — Other Ambulatory Visit: Payer: Self-pay | Admitting: Family Medicine

## 2012-05-22 ENCOUNTER — Other Ambulatory Visit: Payer: Self-pay | Admitting: Gastroenterology

## 2012-05-22 ENCOUNTER — Ambulatory Visit
Admission: RE | Admit: 2012-05-22 | Discharge: 2012-05-22 | Disposition: A | Discharge status: Home / Self Care | Payer: Medicare Other | Source: Ambulatory Visit | Attending: Family Medicine | Admitting: Family Medicine

## 2012-05-22 ENCOUNTER — Other Ambulatory Visit (INDEPENDENT_AMBULATORY_CARE_PROVIDER_SITE_OTHER): Payer: Self-pay | Admitting: General Surgery

## 2012-05-22 DIAGNOSIS — K449 Diaphragmatic hernia without obstruction or gangrene: Secondary | ICD-10-CM

## 2012-05-22 DIAGNOSIS — Z1231 Encounter for screening mammogram for malignant neoplasm of breast: Secondary | ICD-10-CM

## 2012-05-25 ENCOUNTER — Ambulatory Visit
Admission: RE | Admit: 2012-05-25 | Discharge: 2012-05-25 | Disposition: A | Discharge status: Home / Self Care | Payer: Medicare Other | Source: Ambulatory Visit | Attending: Gastroenterology | Admitting: Gastroenterology

## 2012-05-25 ENCOUNTER — Other Ambulatory Visit: Payer: Self-pay | Admitting: Gastroenterology

## 2012-05-25 DIAGNOSIS — K449 Diaphragmatic hernia without obstruction or gangrene: Secondary | ICD-10-CM

## 2012-06-04 ENCOUNTER — Telehealth (INDEPENDENT_AMBULATORY_CARE_PROVIDER_SITE_OTHER): Payer: Self-pay | Admitting: General Surgery

## 2012-06-04 NOTE — Telephone Encounter (Signed)
Called requesting recds again for hiatal hernia.Marland KitchenMarland KitchenLMOM to fax to my Rockne Menghini to [phone]

## 2012-06-06 ENCOUNTER — Encounter (INDEPENDENT_AMBULATORY_CARE_PROVIDER_SITE_OTHER): Payer: Self-pay | Admitting: General Surgery

## 2012-06-08 ENCOUNTER — Encounter (INDEPENDENT_AMBULATORY_CARE_PROVIDER_SITE_OTHER): Payer: Self-pay | Admitting: General Surgery

## 2012-06-08 ENCOUNTER — Ambulatory Visit (INDEPENDENT_AMBULATORY_CARE_PROVIDER_SITE_OTHER): Payer: Medicare Other | Admitting: General Surgery

## 2012-06-08 VITALS — BP 140/92 | HR 86 | Temp 97.3°F | Ht <= 58 in | Wt 178.6 lb

## 2012-06-08 DIAGNOSIS — K449 Diaphragmatic hernia without obstruction or gangrene: Secondary | ICD-10-CM

## 2012-06-08 NOTE — Progress Notes (Addendum)
Patient ID: Angela Bond, female   DOB: 1945/12/03, 67 y.o.   MRN: [id number]  Chief Complaint  Patient presents with  . New Evaluation    eval large hiatal hernia    HPI Angela Bond is a 67 y.o. female.  The patient is a 67 year old female who is referred by Dr. Evette Cristal for large had a hernia.  Patient states she's had a hernia for several years and has been told this, she is unable to know how many years exactly. She states over the past 2 years she's had increasing intolerance to food which results and regurgitation of her meals. She also states that after she is a large meal she become short of breath. Patient had undergone a heart catheterization secondary to chest tightness and pain after meals which resulted in no heart blockage and required a stent.  The patientat this timestates she can tolerate liquids by mouth when she is not eating a meal at that time. HPI  Past Medical History  Diagnosis Date  . Arthritis   . Hypertension   . GERD (gastroesophageal reflux disease)   . Hypercholesteremia     Past Surgical History  Procedure Date  . Total knee arthroplasty   . Brain surgery     Rt br bx  . Eye surgery 2013    tube placed Left    Family History  Problem Relation Age of Onset  . Heart disease Mother   . Heart disease Father   . Cancer Father     skin  . Cancer Sister     breast    Social History History  Substance Use Topics  . Smoking status: Never Smoker   . Smokeless tobacco: Not on file  . Alcohol Use: No    Allergies  Allergen Reactions  . Aleve (Naproxen)   . Aspirin     Burns stomach  . Ciprofloxacin Nausea And Vomiting  . Doxycycline Nausea And Vomiting  . Evista (Raloxifene)     Leg aches  . Fosamax (Alendronate Sodium)     Leg aches  . Pravachol (Pravastatin)     GI surgery  . Sulfa Antibiotics Nausea Only  . Welchol (Colesevelam Hcl)     Stomach cramps  . Zocor (Simvastatin) Rash    Current Outpatient Prescriptions  Medication  Sig Dispense Refill  . ALPRAZolam (XANAX) 0.25 MG tablet Take 0.25 mg by mouth at bedtime as needed.      . calcium carbonate (OS-CAL) 600 MG TABS Take 600 mg by mouth 2 (two) times daily with a meal.      . Cholecalciferol (VITAMIN D) 2000 UNITS tablet Take 2,000 Units by mouth daily.      . citalopram (CELEXA) 40 MG tablet Take 40 mg by mouth daily.      Marland Kitchen etodolac (LODINE) 500 MG tablet Take 500 mg by mouth 2 (two) times daily.      . fexofenadine (ALLEGRA) 180 MG tablet Take 180 mg by mouth daily.      Marland Kitchen lisinopril (PRINIVIL,ZESTRIL) 20 MG tablet Take 20 mg by mouth daily.      Marland Kitchen lovastatin (MEVACOR) 40 MG tablet Take 40 mg by mouth at bedtime.      Marland Kitchen omeprazole (PRILOSEC) 40 MG capsule Take 40 mg by mouth daily.      . polyethylene glycol (MIRALAX / GLYCOLAX) packet Take 17 g by mouth daily.      . Red Yeast Rice Extract (RED YEAST RICE PO) Take 1 tablet by  mouth daily.      Marland Kitchen SALINE NASAL MIST NA Place into the nose.      . ondansetron (ZOFRAN) 8 MG tablet         Review of Systems Review of Systems  Constitutional: Negative.   HENT: Negative.   Eyes: Negative.   Cardiovascular: Negative.   Gastrointestinal: Positive for nausea and vomiting.  Musculoskeletal: Negative.   Neurological: Negative.     Blood pressure 140/92, pulse 86, temperature 97.3 F (36.3 C), temperature source Temporal, height 4\' 8"  (1.422 m), weight 178 lb 9.6 oz (81.012 kg), SpO2 95.00%.  Physical Exam Physical Exam  Constitutional: She is oriented to person, place, and time. She appears well-developed and well-nourished.  HENT:  Head: Normocephalic and atraumatic.  Eyes: Conjunctivae normal and EOM are normal. Pupils are equal, round, and reactive to light.  Neck: Normal range of motion. Neck supple.  Cardiovascular: Normal rate, regular rhythm and normal heart sounds.   Pulmonary/Chest: Effort normal and breath sounds normal.  Abdominal: Soft. Bowel sounds are normal. She exhibits no distension and  no mass. There is no tenderness. There is no rebound and no guarding.  Musculoskeletal: Normal range of motion.  Neurological: She is alert and oriented to person, place, and time.    Data Reviewed Upper GI: Reveals the entire stomach within the right chest. Esophagus does not look shortened  Assessment    67 year old female with a large hiatal hernia and gastric herniation into the right chest.    Plan    1. We will send patient back to Dr. Evette Cristal for a upper endoscopy to rule out any carcinoma prior sitting upright or hernia repair and Nissen wrap. 2.  Assessment the patient and her husband the pathophysiology hiatal hernia and the need to reduce her stomach and her abdomen and  Anchor it in order to allow her to be able to hold food down.  After the patient is cleared up endoscopy will schedule the patient for a laparoscopic hernia repair with Nissen fundoplication. All questions were answered and the patient and her husband. 3. All risks and benefits were discussed with the patient, to generally include infection, bleeding, damage to surrounding structures, and recurrence. Alternatives were offered and described.  All questions were answered and the patient voiced understanding of the procedure and wishes to proceed at this point.        Marigene Ehlers., Keighley Deckman 06/08/2012, 2:01 PM

## 2012-06-18 ENCOUNTER — Telehealth (INDEPENDENT_AMBULATORY_CARE_PROVIDER_SITE_OTHER): Payer: Self-pay | Admitting: General Surgery

## 2012-06-18 ENCOUNTER — Other Ambulatory Visit (INDEPENDENT_AMBULATORY_CARE_PROVIDER_SITE_OTHER): Payer: Self-pay | Admitting: General Surgery

## 2012-06-18 DIAGNOSIS — K449 Diaphragmatic hernia without obstruction or gangrene: Secondary | ICD-10-CM

## 2012-06-18 NOTE — Telephone Encounter (Signed)
Called Angela Bond at [facility] to set patient up for esophageal manometry.Marland KitchenMarland KitchenCarollee Herter was in clinic so she is suppose to call Lamir Racca back..Dr.Ganem performed upper endo on 06/13/12

## 2012-06-18 NOTE — Telephone Encounter (Signed)
Carollee Herter returned my call and will call me back once she schd the manometry.Marland Kitchen

## 2012-06-19 NOTE — Telephone Encounter (Signed)
Manometry is schd 2/17 @1 :00 at [facility] and patient is aware per Carollee Herter at [facility] office.Marland KitchenMarland KitchenDerrell Bond aware as well

## 2012-06-19 NOTE — Telephone Encounter (Signed)
EGD report not ready at this time 06/19/12 per sShannon

## 2012-06-22 ENCOUNTER — Encounter (INDEPENDENT_AMBULATORY_CARE_PROVIDER_SITE_OTHER): Payer: Self-pay

## 2012-07-02 ENCOUNTER — Encounter (HOSPITAL_COMMUNITY): Payer: Self-pay | Admitting: *Deleted

## 2012-07-02 ENCOUNTER — Ambulatory Visit (HOSPITAL_COMMUNITY)
Admission: RE | Admit: 2012-07-02 | Discharge: 2012-07-02 | Disposition: A | Discharge status: Home / Self Care | Payer: Medicare Other | Source: Ambulatory Visit | Attending: Gastroenterology | Admitting: Gastroenterology

## 2012-07-02 ENCOUNTER — Encounter (HOSPITAL_COMMUNITY)
Admission: RE | Disposition: A | Discharge status: Home / Self Care | Payer: Self-pay | Source: Ambulatory Visit | Attending: Gastroenterology

## 2012-07-02 DIAGNOSIS — K449 Diaphragmatic hernia without obstruction or gangrene: Secondary | ICD-10-CM | POA: Insufficient documentation

## 2012-07-02 HISTORY — PX: ESOPHAGEAL MANOMETRY: SHX5429

## 2012-07-02 SURGERY — MANOMETRY, ESOPHAGUS

## 2012-07-02 MED ORDER — LIDOCAINE VISCOUS 2 % MT SOLN
OROMUCOSAL | Status: AC
  Filled 2012-07-02: qty 15

## 2012-07-03 ENCOUNTER — Encounter (HOSPITAL_COMMUNITY): Payer: Self-pay | Admitting: Gastroenterology

## 2012-07-11 ENCOUNTER — Telehealth (INDEPENDENT_AMBULATORY_CARE_PROVIDER_SITE_OTHER): Payer: Self-pay | Admitting: General Surgery

## 2012-07-11 NOTE — Telephone Encounter (Signed)
Returned patient's call re: getting surgery sched because she was miserable and I told her that we were waiting for Dr.Schooler to read the manometry results per Carollee Herter this morning @ Dr.Ganem office...and then we would proceed after and I would let her know something as soon as I heard something.Angela Bond

## 2012-07-11 NOTE — Telephone Encounter (Signed)
Called and spoke with [facility] @ Dr.Ganem's office re: patient's manometry results.Marland KitchenMarland KitchenCorrie Dandy is sending Shannon/Dr.Ganem's nurse message to call Lawson Fiscal back about this...this was 9:37 07/11/12

## 2012-07-12 NOTE — Telephone Encounter (Signed)
Per verbal orders from [facility] surgical orders given to Tiranda our surgery scheduler to go ahead and set up patients surgery date and time...per [place] she stated that she would call the patient.Marland Kitchenif manometry results come back and we can't proceed with the surgery then we will just cancel per [facility]

## 2012-07-17 NOTE — H&P (Signed)
01/13/2012 Office Visit:  Donato Schultz, MD    Reason for Appointment  1. PER DR ROBERT EHINGER FOLLOWUP TO DISCUSS FURTHER TESTING  2. Pt. states she had scarlet fever as a baby and almost died      History of Present Illness  General:           67 year old with hypertension, hyperlipidemia who is here for reevaluation of significant dyspnea on exertion and chest pain with exertion. She feels diaphoretic with this exertion and her stress test previously done demonstrated poor exercise tolerance. Her husband has multiple stents/CAD. She is worried about exercising once again and wants to make sure that this is safe to do so. Her LDL cholesterol at last check was 125. Electrolytes were normal. Echocardiogram demonstrated normal ejection fraction, mild mitral regurgitation in 07/21/11. Nuclear stress test showed no ischemia but she only exercised for 3 minutes and 30 seconds due to poor exercise tolerance. Chest x-ray showed no acute abnormalities. EKG showed sinus rhythm without any changes.                    She continues to have worrisome symptoms of chest pain/dyspnea on exertion/diaphoresis which are anginal in quality. .     Current Medications  Etodolac 500 MG Tablet 1 tablet Twice a day  Fexofenadine HCl 180 MG Tablet 1 tablet Once a day for allergies  MiraLax . Powder 1 capful mixed with 8 ounces of fluid as needed  Saline Nasal Spray 0.65 % Solution 2 pufffs as needed  Calcium 600 MG Tablet 1 tablet Twice a day for bone strength (OTC)  Xanax 0.25 MG Tablet 1/2 to 1 tablet once a day as needed for anxiety  Lisinopril 20 MG Tablet 1 tablet Once a day for BP  Red Yeast Rice Extract 600 MG Capsule 1 capsule once a day for cholesterol  Vitamin D3 2000 UNIT Capsule 1 capsule once a day  Citalopram Hydrobromide 40 MG Tablet 1 tablet onec a day  Omeprazole 40 MG Capsule Delayed Release 1 capsule Once a day for acid reflux  Econazole Nitrate 1 % Cream 1 application to affected area twice a day   Lovastatin 40 MG Tablet TAKE 1 TABLET BY MOUTH EVERY DAY FOR CHOLESTEROL   Medication List reviewed and reconciled with the patient        Past Medical History  Hypertension  Hypercholesterolemia  Osteopenia  Allergy  Depression  Anxiety  GERD      Surgical History  Esophagus stretched   breast biopsy   torn cartilage in knee   Right TKR 2011  Tear duct surgery 11/2010 & 04/2011  Left TKR 12/2010      Family History  Father: CAD, Passed away at 37 of MI   Mother: CAD, CHF   Sister 1: CAD 28 CABG   Negative GI family history.      Social History  General:         History of smoking  cigarettes: Never smoked.         no Smoking.        no Alcohol.        no Recreational drug use.        Marital Status: married.     Allergies  Sulfa drugs: Nausea: Allergy  Zocor: rash: Allergy  Pravachol: GI sx: Allergy  WelChol: Stomach cramps: Allergy  Evista: Leg aches: Allergy  Cipro: vomiting: Allergy  Fosamax: leg aches  Aspirin: burns stomach  Aleve  Doxycycline Hyclate:  vomiting      Review of Systems  Dosing to be, no bleeding, no orthopnea, no dizziness, no rashes, no dysphasia, or strokelike symptoms, all others negative.    Vital Signs  Wt 178, Wt change 6 lb, Ht 59.5, BMI 35.35, Pulse sitting 72, BP sitting 150/98.    Examination  General Examination:        GENERAL APPEARANCE alert, oriented, NAD, pleasant.          SKIN: normal, no rash.          HEENT: normal.          HEAD: Earlimart/AT.          EYES: EOMI, Conjunctiva clear.          NECK: supple, FROM, without evidence of thyromegaly, adenopathy, or bruits, no jugular venous distention (JVD).          LUNGS: clear to auscultation bilaterally, no wheezes, rhonchi, rales, regular breathing rate and effort.          HEART: regular rate and rhythm, no S3, S4, murmur or rub, point of maximul impulse (PMI) normal.          ABDOMEN: soft, non-tender/non-distended, bowel sounds present, no masses palpated, no  bruit.          EXTREMITIES: no clubbing, no edema, pulses 2 plus bilaterally.          NEUROLOGIC EXAM: non-focal exam, alert and oriented x 3.          PERIPHERAL PULSES: normal (2+) bilaterally.          LYMPH NODES: no cervical adenopathy.          PSYCH affect normal.       Assessments  1. Dyspnea on exertion - 786.09 (Primary)  2. Chest pain on exertion - 786.50  3. Hypercholesteremia, pure - 272.0  4. Essential hypertension, benign - 401.1    Treatment  1. Dyspnea on exertion        LAB: Basic Metabolic  creatinine 0.70     GLUCOSE 70 70-99 - mg/dL        BUN 9 4-40 - mg/dL        CREATININE 1.02 0.60-1.30 - mg/dl        eGFR (NON-AFRICAN AMERICAN) 74 >60 - calc        eGFR (AFRICAN AMERICAN) 89 >60 - calc        SODIUM 142 136-145 - mmol/L        POTASSIUM 4.0 3.5-5.5 - mmol/L        CHLORIDE 104 98-107 - mmol/L         C02 33 22-32 - mg/dL H       ANION GAP 9.1 7.2-53.6 - mmol/L        CALCIUM 9.0 8.6-10.3 - mg/dL               SKAINS,MARK 01/13/2012 01:21:17 PM > okay for catheterization        LAB: CBC with Diff  hemoglobin 12.1     WBC 7.1 4.0-11.0 - K/ul         RBC 4.10 4.20-5.40 - M/uL L       HGB 12.1 12.0-16.0 - g/dL         HCT 64.4 03.4-74.2 - % L       MCH 29.4 27.0-33.0 - pg        MPV 9.4 7.5-10.7 - fL        MCV 89.3 81.0-99.0 -  fL        MCHC 32.9 32.0-36.0 - g/dL        RDW 16.1 09.6-04.5 - %        NRBC# 0.00 -        PLT 192 150-400 - K/uL        NEUT % 60.7 43.3-71.9 - %        NRBC% 0.00 - %        LYMPH% 29.4 16.8-43.5 - %        MONO % 7.5 4.6-12.4 - %        EOS % 1.9 0.0-7.8 - %        BASO % 0.5 0.0-1.0 - %        NEUT # 4.3 1.9-7.2 - K/uL        LYMPH# 2.10 1.10-2.70 - K/uL        MONO # 0.5 0.3-0.8 - K/uL        EOS # 0.1 0.0-0.6 - K/uL        BASO # 0.0 0.0-0.1 - K/uL               SKAINS,MARK 01/13/2012 01:20:56 PM > okay for catheterization        LAB: PT (Prothrombin Time) (409811)  normal     Prothrombin Time 11.0 9.1-12.0  - SEC        INR 1.0 0.8-1.2 -               SKAINS,MARK 01/13/2012 04:40:33 PM > okay for catheterization   With her continued symptoms, increasing severity, I will proceed with cardiac catheterization, radial approach optimal, JV lab. Risks and benefits of cardiac catheterization have been reviewed including risk of stroke, heart attack, death, bleeding, renal impariment and arterial damage. There was ample oppurtuny to answer questions. Alternatives were discussed. Patient understands and wishes to proceed. There is certainly a possibility that her lack of perfusion defect may be secondary to triple-vessel disease. Her overall exercise time was poor. Her continued symptoms of chest pain/dyspnea on exertion continue to be worrisome for possible coronary disease.      2. Hypercholesteremia, pure   No changes made. Continue with diet.      3. Essential hypertension, benign   Blood pressure fairly well controlled, elevated today. No changes made.           Procedure Codes  91478 ECL BMP  85025 ECL CBC PLATELET DIFF  29562 BLOOD COLLECTION ROUTINE VENIPUNCTURE      Follow Up  post cath

## 2012-07-26 NOTE — Progress Notes (Signed)
Dr Derrell Lolling-  NEED PRE OP ORDERS PLEASE-  PST APPT 08/01/12  [facility]

## 2012-07-27 ENCOUNTER — Encounter (HOSPITAL_COMMUNITY): Payer: Self-pay | Admitting: Pharmacy Technician

## 2012-07-30 ENCOUNTER — Other Ambulatory Visit (INDEPENDENT_AMBULATORY_CARE_PROVIDER_SITE_OTHER): Payer: Self-pay | Admitting: General Surgery

## 2012-07-30 NOTE — Progress Notes (Signed)
We need orders in EPIC please -- pt coming for preop 08/01/12 --thank you

## 2012-07-31 ENCOUNTER — Ambulatory Visit (INDEPENDENT_AMBULATORY_CARE_PROVIDER_SITE_OTHER): Payer: Medicare Other | Admitting: General Surgery

## 2012-07-31 ENCOUNTER — Encounter (INDEPENDENT_AMBULATORY_CARE_PROVIDER_SITE_OTHER): Payer: Self-pay | Admitting: General Surgery

## 2012-07-31 VITALS — BP 128/72 | HR 85 | Temp 96.9°F | Resp 18 | Ht <= 58 in | Wt 178.8 lb

## 2012-07-31 DIAGNOSIS — K449 Diaphragmatic hernia without obstruction or gangrene: Secondary | ICD-10-CM

## 2012-07-31 HISTORY — DX: Diaphragmatic hernia without obstruction or gangrene: K44.9

## 2012-07-31 NOTE — Progress Notes (Signed)
Patient ID: Angela Bond, female   DOB: Aug 07, 1945, 67 y.o.   MRN: [id number]  Chief Complaint  Patient presents with  . Follow-up    discuss results/hernia sx    HPI Angela Bond is a 67 y.o. female.  The patient is a 67 year old female who is referred for a large hiatal hernia, in herniation of her stomach in her chest. Patient has had this for several months, and complains of dysphagia. She has adapted her diet to be able to eat without regurgitation. Patient has undergone manometry which is inconclusive for the report. She is also not undergone endoscopy which shows no carcinoma. Upper GI reveals large amount of her stomach with her chest.    HPI  Past Medical History  Diagnosis Date  . Arthritis   . Hypertension   . GERD (gastroesophageal reflux disease)   . Hypercholesteremia     Past Surgical History  Procedure Laterality Date  . Total knee arthroplasty    . Brain surgery      Rt br bx  . Eye surgery  2013    tube placed Left  . Esophageal manometry N/A 07/02/2012    Procedure: ESOPHAGEAL MANOMETRY (EM);  Surgeon: Shirley Friar, MD;  Location: [facility];  Service: Endoscopy;  Laterality: N/A;  schooler to read    Family History  Problem Relation Age of Onset  . Heart disease Mother   . Heart disease Father   . Cancer Father     skin  . Cancer Sister     breast    Social History History  Substance Use Topics  . Smoking status: Never Smoker   . Smokeless tobacco: Not on file  . Alcohol Use: No    Allergies  Allergen Reactions  . Aleve (Naproxen)   . Aspirin     Burns stomach  . Ciprofloxacin Nausea And Vomiting  . Doxycycline Nausea And Vomiting  . Evista (Raloxifene)     Leg aches  . Fosamax (Alendronate Sodium)     Leg aches  . Pravachol (Pravastatin)     GI surgery  . Sulfa Antibiotics Nausea Only  . Welchol (Colesevelam Hcl)     Stomach cramps  . Zocor (Simvastatin) Rash    Current Outpatient Prescriptions  Medication Sig  Dispense Refill  . ALPRAZolam (XANAX) 0.25 MG tablet Take 0.25 mg by mouth at bedtime as needed for sleep or anxiety.       . calcium carbonate (OS-CAL) 600 MG TABS Take 600 mg by mouth 2 (two) times daily with a meal.      . Cholecalciferol (VITAMIN D) 2000 UNITS tablet Take 2,000 Units by mouth 2 (two) times daily.       . citalopram (CELEXA) 40 MG tablet Take 40 mg by mouth daily before breakfast.       . fexofenadine (ALLEGRA) 180 MG tablet Take 180 mg by mouth daily.      . hydroxypropyl methylcellulose (ISOPTO TEARS) 2.5 % ophthalmic solution Place 1 drop into both eyes as needed (dry eyes).      Marland Kitchen lisinopril (PRINIVIL,ZESTRIL) 20 MG tablet Take 20 mg by mouth daily before breakfast.       . lovastatin (MEVACOR) 40 MG tablet Take 40 mg by mouth daily before breakfast.       . omeprazole (PRILOSEC) 40 MG capsule Take 40 mg by mouth daily.      . Red Yeast Rice Extract (RED YEAST RICE PO) Take 600 mg by mouth daily.       Marland Kitchen  SALINE NASAL MIST NA Place 1 spray into the nose 2 (two) times daily.        No current facility-administered medications for this visit.    Review of Systems Review of Systems  Constitutional: Negative.   HENT: Negative.   Eyes: Negative.   Cardiovascular: Negative.   Gastrointestinal: Negative.   Neurological: Negative.     Blood pressure 128/72, pulse 85, temperature 96.9 F (36.1 C), temperature source Temporal, resp. rate 18, height 4\' 8"  (1.422 m), weight 178 lb 12.8 oz (81.103 kg).  Physical Exam Physical Exam  Constitutional: She is oriented to person, place, and time. She appears well-developed and well-nourished.  HENT:  Head: Normocephalic and atraumatic.  Eyes: Conjunctivae and EOM are normal. Pupils are equal, round, and reactive to light.  Neck: Normal range of motion. Neck supple.  Cardiovascular: Normal rate, regular rhythm and normal heart sounds.   Pulmonary/Chest: Effort normal and breath sounds normal.  Abdominal: Soft. Bowel sounds  are normal. She exhibits no mass. There is no tenderness. There is no rebound and no guarding.  Musculoskeletal: Normal range of motion.  Neurological: She is alert and oriented to person, place, and time.    Data Reviewed As above.  Assessment    67 year old female with a large hiatal hernia and herniation of her stomach into her chest cavity.     Plan    1. Will proceed to the operating room for laparoscopic hiatal hernia repair with Nissen fundoplication. Patient might also need gastrostomy tube to help anchor the stomach into the abdomen.  2. We discussed the risks and benefits of the surgery to include bleeding, infection, esophageal leak, stenosis of the repair, recurrence of the hiatal hernia, slipped Nissen, MI, stroke, the possibility for need of prolonged intubation postoperatively. The patient and her husband had all questions answered at this time they voiced understanding of these risks and decided to proceed to the operating room for fixation.        Marigene Ehlers., Diarra Ceja 07/31/2012, 12:00 PM

## 2012-08-01 ENCOUNTER — Encounter (HOSPITAL_COMMUNITY)
Admission: RE | Admit: 2012-08-01 | Discharge: 2012-08-01 | Disposition: A | Discharge status: Home / Self Care | Payer: Medicare Other | Source: Ambulatory Visit | Attending: General Surgery | Admitting: General Surgery

## 2012-08-01 ENCOUNTER — Encounter (HOSPITAL_COMMUNITY): Payer: Self-pay

## 2012-08-01 ENCOUNTER — Ambulatory Visit (HOSPITAL_COMMUNITY)
Admission: RE | Admit: 2012-08-01 | Discharge: 2012-08-01 | Disposition: A | Discharge status: Home / Self Care | Payer: Medicare Other | Source: Ambulatory Visit | Attending: General Surgery | Admitting: General Surgery

## 2012-08-01 DIAGNOSIS — Z01812 Encounter for preprocedural laboratory examination: Secondary | ICD-10-CM | POA: Insufficient documentation

## 2012-08-01 DIAGNOSIS — K449 Diaphragmatic hernia without obstruction or gangrene: Secondary | ICD-10-CM | POA: Insufficient documentation

## 2012-08-01 HISTORY — DX: Major depressive disorder, single episode, unspecified: F32.9

## 2012-08-01 HISTORY — DX: Personal history of other diseases of the digestive system: Z87.19

## 2012-08-01 HISTORY — DX: Cardiac murmur, unspecified: R01.1

## 2012-08-01 HISTORY — DX: Depression, unspecified: F32.A

## 2012-08-01 LAB — BASIC METABOLIC PANEL
CO2: 29 mEq/L (ref 19–32)
Chloride: 102 mEq/L (ref 96–112)
Creatinine, Ser: 0.93 mg/dL (ref 0.50–1.10)
GFR calc Af Amer: 72 mL/min — ABNORMAL LOW (ref >90)
Potassium: 4 mEq/L (ref 3.5–5.1)

## 2012-08-01 LAB — CBC
HCT: 36.9 % (ref 36.0–46.0)
MCV: 89.6 fL (ref 78.0–100.0)
Platelets: 196 10*3/uL (ref 150–400)
RBC: 4.12 MIL/uL (ref 3.87–5.11)
RDW: 13.6 % (ref 11.5–15.5)
WBC: 6.3 10*3/uL (ref 4.0–10.5)

## 2012-08-01 LAB — SURGICAL PCR SCREEN: Staphylococcus aureus: POSITIVE — AB

## 2012-08-01 NOTE — Patient Instructions (Signed)
19 Angela Bond  08/01/2012   Your procedure is scheduled on: 08/10/12  Report to [facility] at 0515 AM.  Call this number if you have problems the morning of surgery [phone]   Remember:   Do not eat food or drink liquids After Midnight.     Take these medicines the morning of surgery with A SIP OF WATER: celexa, allegra, lovastatin, prilosec    Do not wear jewelry, make-up or nail polish.  Do not wear lotions, powders, or perfumes. You may wear deodorant.  Do not shave 48 hours prior to surgery. Men may shave face and neck.  Do not bring valuables to the hospital.  Contacts, dentures or bridgework may not be worn into surgery.  Leave suitcase in the car. After surgery it may be brought to your room.  For patients admitted to the hospital, checkout time is 11:00 AM the day of discharge.    Please read over the following fact sheets that you were given: MRSA Information.  Birdie Sons, RN  pre op nurse call if needed [phone]    FAILURE TO FOLLOW THESE INSTRUCTIONS MAY RESULT IN CANCELLATION OF YOUR SURGERY   Patient Signature: ___________________________________________

## 2012-08-10 ENCOUNTER — Inpatient Hospital Stay (HOSPITAL_COMMUNITY): Payer: Medicare Other

## 2012-08-10 ENCOUNTER — Ambulatory Visit (HOSPITAL_COMMUNITY): Payer: Medicare Other | Admitting: Anesthesiology

## 2012-08-10 ENCOUNTER — Encounter (HOSPITAL_COMMUNITY): Payer: Self-pay | Admitting: Anesthesiology

## 2012-08-10 ENCOUNTER — Encounter (HOSPITAL_COMMUNITY)
Admission: RE | Disposition: A | Discharge status: Home / Self Care | Payer: Self-pay | Source: Ambulatory Visit | Attending: General Surgery

## 2012-08-10 ENCOUNTER — Inpatient Hospital Stay (HOSPITAL_COMMUNITY)
Admission: RE | Admit: 2012-08-10 | Discharge: 2012-08-14 | DRG: 327 | Disposition: A | Discharge status: Home / Self Care | Payer: Medicare Other | Source: Ambulatory Visit | Attending: General Surgery | Admitting: General Surgery

## 2012-08-10 ENCOUNTER — Encounter (HOSPITAL_COMMUNITY): Payer: Self-pay | Admitting: *Deleted

## 2012-08-10 DIAGNOSIS — K449 Diaphragmatic hernia without obstruction or gangrene: Secondary | ICD-10-CM

## 2012-08-10 DIAGNOSIS — K219 Gastro-esophageal reflux disease without esophagitis: Secondary | ICD-10-CM | POA: Diagnosis present

## 2012-08-10 DIAGNOSIS — I200 Unstable angina: Secondary | ICD-10-CM | POA: Diagnosis present

## 2012-08-10 DIAGNOSIS — Z96659 Presence of unspecified artificial knee joint: Secondary | ICD-10-CM

## 2012-08-10 DIAGNOSIS — Z79899 Other long term (current) drug therapy: Secondary | ICD-10-CM

## 2012-08-10 DIAGNOSIS — E876 Hypokalemia: Secondary | ICD-10-CM | POA: Diagnosis not present

## 2012-08-10 DIAGNOSIS — M129 Arthropathy, unspecified: Secondary | ICD-10-CM | POA: Diagnosis present

## 2012-08-10 DIAGNOSIS — R4789 Other speech disturbances: Secondary | ICD-10-CM | POA: Diagnosis present

## 2012-08-10 DIAGNOSIS — I4891 Unspecified atrial fibrillation: Secondary | ICD-10-CM

## 2012-08-10 DIAGNOSIS — I10 Essential (primary) hypertension: Secondary | ICD-10-CM | POA: Diagnosis present

## 2012-08-10 DIAGNOSIS — I480 Paroxysmal atrial fibrillation: Secondary | ICD-10-CM | POA: Diagnosis not present

## 2012-08-10 DIAGNOSIS — E7800 Pure hypercholesterolemia, unspecified: Secondary | ICD-10-CM | POA: Diagnosis present

## 2012-08-10 DIAGNOSIS — I959 Hypotension, unspecified: Secondary | ICD-10-CM | POA: Diagnosis not present

## 2012-08-10 DIAGNOSIS — J9819 Other pulmonary collapse: Secondary | ICD-10-CM | POA: Diagnosis not present

## 2012-08-10 HISTORY — PX: HIATAL HERNIA REPAIR: SHX195

## 2012-08-10 LAB — CBC
MCHC: 32.9 g/dL (ref 30.0–36.0)
MCV: 88.8 fL (ref 78.0–100.0)
Platelets: 145 10*3/uL — ABNORMAL LOW (ref 150–400)
RDW: 13.8 % (ref 11.5–15.5)
WBC: 12.1 10*3/uL — ABNORMAL HIGH (ref 4.0–10.5)

## 2012-08-10 LAB — CREATININE, SERUM: GFR calc Af Amer: >90 mL/min (ref >90)

## 2012-08-10 SURGERY — REPAIR, HERNIA, HIATAL, LAPAROSCOPIC
Anesthesia: General | Wound class: Clean Contaminated

## 2012-08-10 MED ORDER — NEOSTIGMINE METHYLSULFATE 1 MG/ML IJ SOLN
INTRAMUSCULAR | Status: DC | PRN
  Administered 2012-08-10: 4 mg via INTRAVENOUS

## 2012-08-10 MED ORDER — DIPHENHYDRAMINE HCL 12.5 MG/5ML PO ELIX: 12.5000 mg | ORAL_SOLUTION | Freq: Four times a day (QID) | ORAL | Status: DC | PRN

## 2012-08-10 MED ORDER — SODIUM CHLORIDE 0.9 % IV SOLN
1.0000 g | INTRAVENOUS | Status: AC
  Administered 2012-08-10: 1 g via INTRAVENOUS

## 2012-08-10 MED ORDER — SODIUM CHLORIDE 0.9 % IV SOLN
INTRAVENOUS | Status: AC
  Filled 2012-08-10: qty 1

## 2012-08-10 MED ORDER — GLYCOPYRROLATE 0.2 MG/ML IJ SOLN
INTRAMUSCULAR | Status: DC | PRN
  Administered 2012-08-10: 0.4 mg via INTRAVENOUS
  Administered 2012-08-10: .6 mg via INTRAVENOUS

## 2012-08-10 MED ORDER — ACETAMINOPHEN 10 MG/ML IV SOLN
INTRAVENOUS | Status: DC | PRN
  Administered 2012-08-10: 1000 mg via INTRAVENOUS

## 2012-08-10 MED ORDER — MEPERIDINE HCL 50 MG/ML IJ SOLN: 6.2500 mg | INTRAMUSCULAR | Status: DC | PRN

## 2012-08-10 MED ORDER — BUPIVACAINE-EPINEPHRINE 0.25% -1:200000 IJ SOLN
INTRAMUSCULAR | Status: DC | PRN
  Administered 2012-08-10: 18 mL

## 2012-08-10 MED ORDER — ONDANSETRON HCL 4 MG/2ML IJ SOLN
INTRAMUSCULAR | Status: DC | PRN
  Administered 2012-08-10: 4 mg via INTRAVENOUS

## 2012-08-10 MED ORDER — HYPROMELLOSE (GONIOSCOPIC) 2.5 % OP SOLN
1.0000 [drp] | OPHTHALMIC | Status: DC | PRN
Start: 2012-08-10 — End: 2012-08-10

## 2012-08-10 MED ORDER — PROPOFOL 10 MG/ML IV BOLUS
INTRAVENOUS | Status: DC | PRN
  Administered 2012-08-10: 100 mg via INTRAVENOUS

## 2012-08-10 MED ORDER — FENTANYL CITRATE 0.05 MG/ML IJ SOLN
INTRAMUSCULAR | Status: DC | PRN
  Administered 2012-08-10: 25 ug via INTRAVENOUS
  Administered 2012-08-10: 100 ug via INTRAVENOUS
  Administered 2012-08-10 (×5): 25 ug via INTRAVENOUS

## 2012-08-10 MED ORDER — LACTATED RINGERS IV SOLN: INTRAVENOUS | Status: DC

## 2012-08-10 MED ORDER — FENTANYL CITRATE 0.05 MG/ML IJ SOLN
25.0000 ug | INTRAMUSCULAR | Status: DC | PRN
  Administered 2012-08-10 (×2): 25 ug via INTRAVENOUS

## 2012-08-10 MED ORDER — HYDROMORPHONE HCL PF 1 MG/ML IJ SOLN
1.0000 mg | INTRAMUSCULAR | Status: DC | PRN
  Administered 2012-08-10 – 2012-08-14 (×15): 1 mg via INTRAVENOUS
  Filled 2012-08-10 (×15): qty 1

## 2012-08-10 MED ORDER — ALPRAZOLAM 0.25 MG PO TABS: 0.2500 mg | ORAL_TABLET | Freq: Every evening | ORAL | Status: DC | PRN

## 2012-08-10 MED ORDER — ENOXAPARIN SODIUM 40 MG/0.4ML ~~LOC~~ SOLN
40.0000 mg | SUBCUTANEOUS | Status: DC
  Administered 2012-08-11 – 2012-08-14 (×4): 40 mg via SUBCUTANEOUS
  Filled 2012-08-10 (×5): qty 0.4

## 2012-08-10 MED ORDER — LISINOPRIL 20 MG PO TABS
20.0000 mg | ORAL_TABLET | Freq: Every day | ORAL | Status: DC
  Administered 2012-08-12 – 2012-08-14 (×2): 20 mg via ORAL
  Filled 2012-08-10 (×4): qty 1

## 2012-08-10 MED ORDER — BUPIVACAINE-EPINEPHRINE PF 0.25-1:200000 % IJ SOLN
INTRAMUSCULAR | Status: AC
  Filled 2012-08-10: qty 30

## 2012-08-10 MED ORDER — IOHEXOL 300 MG/ML  SOLN
50.0000 mL | Freq: Once | INTRAMUSCULAR | Status: AC | PRN
  Administered 2012-08-10: 40 mL via ORAL

## 2012-08-10 MED ORDER — ACETAMINOPHEN 10 MG/ML IV SOLN
INTRAVENOUS | Status: AC
  Filled 2012-08-10: qty 100

## 2012-08-10 MED ORDER — FENTANYL CITRATE 0.05 MG/ML IJ SOLN
INTRAMUSCULAR | Status: AC
  Filled 2012-08-10: qty 2

## 2012-08-10 MED ORDER — LACTATED RINGERS IR SOLN
Status: DC | PRN
  Administered 2012-08-10: 1000 mL

## 2012-08-10 MED ORDER — LACTATED RINGERS IV SOLN
INTRAVENOUS | Status: DC | PRN
  Administered 2012-08-10 (×3): via INTRAVENOUS

## 2012-08-10 MED ORDER — DEXTROSE-NACL 5-0.9 % IV SOLN
INTRAVENOUS | Status: DC
  Administered 2012-08-10 – 2012-08-11 (×3): via INTRAVENOUS
  Administered 2012-08-11: 100 mL/h via INTRAVENOUS

## 2012-08-10 MED ORDER — PROMETHAZINE HCL 25 MG/ML IJ SOLN: 6.2500 mg | INTRAMUSCULAR | Status: DC | PRN

## 2012-08-10 MED ORDER — 0.9 % SODIUM CHLORIDE (POUR BTL) OPTIME
TOPICAL | Status: DC | PRN
  Administered 2012-08-10: 1000 mL

## 2012-08-10 MED ORDER — SUCCINYLCHOLINE CHLORIDE 20 MG/ML IJ SOLN
INTRAMUSCULAR | Status: DC | PRN
  Administered 2012-08-10: 100 mg via INTRAVENOUS

## 2012-08-10 MED ORDER — DIPHENHYDRAMINE HCL 50 MG/ML IJ SOLN: 12.5000 mg | Freq: Four times a day (QID) | INTRAMUSCULAR | Status: DC | PRN

## 2012-08-10 MED ORDER — ROCURONIUM BROMIDE 100 MG/10ML IV SOLN
INTRAVENOUS | Status: DC | PRN
  Administered 2012-08-10: 10 mg via INTRAVENOUS
  Administered 2012-08-10: 20 mg via INTRAVENOUS
  Administered 2012-08-10: 30 mg via INTRAVENOUS
  Administered 2012-08-10: 10 mg via INTRAVENOUS
  Administered 2012-08-10: 20 mg via INTRAVENOUS

## 2012-08-10 MED ORDER — DEXAMETHASONE SODIUM PHOSPHATE 10 MG/ML IJ SOLN
INTRAMUSCULAR | Status: DC | PRN
  Administered 2012-08-10: 10 mg via INTRAVENOUS

## 2012-08-10 MED ORDER — ONDANSETRON HCL 4 MG/2ML IJ SOLN
4.0000 mg | Freq: Four times a day (QID) | INTRAMUSCULAR | Status: DC | PRN
  Administered 2012-08-12: 4 mg via INTRAVENOUS
  Filled 2012-08-10 (×2): qty 2

## 2012-08-10 MED ORDER — EPHEDRINE SULFATE 50 MG/ML IJ SOLN
INTRAMUSCULAR | Status: DC | PRN
  Administered 2012-08-10 (×2): 5 mg via INTRAVENOUS

## 2012-08-10 MED ORDER — HYDROCODONE-HOMATROPINE 5-1.5 MG/5ML PO SYRP: 5.0000 mL | ORAL_SOLUTION | ORAL | Status: DC | PRN

## 2012-08-10 MED ORDER — POLYVINYL ALCOHOL 1.4 % OP SOLN
1.0000 [drp] | OPHTHALMIC | Status: DC | PRN
  Filled 2012-08-10: qty 15

## 2012-08-10 SURGICAL SUPPLY — 62 items
ADH SKN CLS APL DERMABOND .7 (GAUZE/BANDAGES/DRESSINGS)
APL SKNCLS STERI-STRIP NONHPOA (GAUZE/BANDAGES/DRESSINGS) ×1
APPLIER CLIP 5 13 M/L LIGAMAX5 (MISCELLANEOUS) ×2
APPLIER CLIP ROT 10 11.4 M/L (STAPLE)
APR CLP MED LRG 11.4X10 (STAPLE)
APR CLP MED LRG 5 ANG JAW (MISCELLANEOUS) ×1
BENZOIN TINCTURE PRP APPL 2/3 (GAUZE/BANDAGES/DRESSINGS) ×2 IMPLANT
CANISTER SUCTION 2500CC (MISCELLANEOUS) IMPLANT
CANNULA ENDOPATH XCEL 11M (ENDOMECHANICALS) ×4 IMPLANT
CLAMP ENDO BABCK 10MM (STAPLE) IMPLANT
CLIP APPLIE 5 13 M/L LIGAMAX5 (MISCELLANEOUS) IMPLANT
CLIP APPLIE ROT 10 11.4 M/L (STAPLE) IMPLANT
CLOTH BEACON ORANGE TIMEOUT ST (SAFETY) ×2 IMPLANT
DECANTER SPIKE VIAL GLASS SM (MISCELLANEOUS) ×2 IMPLANT
DERMABOND ADVANCED (GAUZE/BANDAGES/DRESSINGS)
DERMABOND ADVANCED .7 DNX12 (GAUZE/BANDAGES/DRESSINGS) IMPLANT
DEVICE SUTURE ENDOST 10MM (ENDOMECHANICALS) IMPLANT
DISSECTOR BLUNT TIP ENDO 5MM (MISCELLANEOUS) IMPLANT
DRAIN PENROSE 18X1/2 LTX STRL (DRAIN) ×2 IMPLANT
DRAPE LAPAROSCOPIC ABDOMINAL (DRAPES) ×2 IMPLANT
ELECT REM PT RETURN 9FT ADLT (ELECTROSURGICAL) ×2
ELECTRODE REM PT RTRN 9FT ADLT (ELECTROSURGICAL) ×1 IMPLANT
ENDOLOOP SUT PDS II  0 18 (SUTURE) ×1
ENDOLOOP SUT PDS II 0 18 (SUTURE) IMPLANT
GLOVE BIO SURGEON STRL SZ7.5 (GLOVE) ×4 IMPLANT
GLOVE BIOGEL PI IND STRL 7.0 (GLOVE) ×1 IMPLANT
GLOVE BIOGEL PI INDICATOR 7.0 (GLOVE) ×1
GOWN PREVENTION PLUS XLARGE (GOWN DISPOSABLE) ×2 IMPLANT
GOWN STRL NON-REIN LRG LVL3 (GOWN DISPOSABLE) ×2 IMPLANT
GOWN STRL REIN XL XLG (GOWN DISPOSABLE) ×2 IMPLANT
GRASPER ENDO BABCOCK 10 (MISCELLANEOUS) IMPLANT
GRASPER ENDO BABCOCK 10MM (MISCELLANEOUS)
HAND ACTIVATED (MISCELLANEOUS) ×2 IMPLANT
KIT BASIN OR (CUSTOM PROCEDURE TRAY) ×2 IMPLANT
NS IRRIG 1000ML POUR BTL (IV SOLUTION) ×2 IMPLANT
PENCIL BUTTON HOLSTER BLD 10FT (ELECTRODE) IMPLANT
SET IRRIG TUBING LAPAROSCOPIC (IRRIGATION / IRRIGATOR) ×2 IMPLANT
SOLUTION ANTI FOG 6CC (MISCELLANEOUS) ×2 IMPLANT
STAPLER HERNIA 12 8.5 360D (INSTRUMENTS) ×2 IMPLANT
STAPLER VISISTAT 35W (STAPLE) IMPLANT
STRIP CLOSURE SKIN 1/2X4 (GAUZE/BANDAGES/DRESSINGS) IMPLANT
SUT ETHIBOND 2 0 SH (SUTURE) ×4
SUT ETHIBOND 2 0 SH 36X2 (SUTURE) IMPLANT
SUT MNCRL AB 4-0 PS2 18 (SUTURE) ×3 IMPLANT
SUT SILK 2 0 SH (SUTURE) ×1 IMPLANT
SUT SURGIDAC NAB ES-9 0 48 120 (SUTURE) IMPLANT
SUT VICRYL 0 TIES 12 18 (SUTURE) IMPLANT
TIP INNERVISION DETACH 40FR (MISCELLANEOUS) IMPLANT
TIP INNERVISION DETACH 50FR (MISCELLANEOUS) IMPLANT
TIP INNERVISION DETACH 56FR (MISCELLANEOUS) IMPLANT
TIPS INNERVISION DETACH 40FR (MISCELLANEOUS)
TISSUE MATRIX STRATTICE 6X8 (Tissue) ×1 IMPLANT
TOWEL OR 17X26 10 PK STRL BLUE (TOWEL DISPOSABLE) ×4 IMPLANT
TOWEL OR NON WOVEN STRL DISP B (DISPOSABLE) ×2 IMPLANT
TRAY FOLEY CATH 14FRSI W/METER (CATHETERS) ×2 IMPLANT
TRAY LAP CHOLE (CUSTOM PROCEDURE TRAY) ×2 IMPLANT
TROCAR BLADELESS OPT 5 75 (ENDOMECHANICALS) ×3 IMPLANT
TROCAR SLEEVE XCEL 5X75 (ENDOMECHANICALS) ×3 IMPLANT
TROCAR XCEL 12X100 BLDLESS (ENDOMECHANICALS) ×1 IMPLANT
TROCAR XCEL BLUNT TIP 100MML (ENDOMECHANICALS) IMPLANT
TROCAR XCEL NON-BLD 11X100MML (ENDOMECHANICALS) ×1 IMPLANT
TUBING INSUFFLATION 10FT LAP (TUBING) ×2 IMPLANT

## 2012-08-10 NOTE — Interval H&P Note (Signed)
History and Physical Interval Note:  08/10/2012 7:09 AM  Angela Bond  has presented today for surgery, with the diagnosis of hiatal hernia  The various methods of treatment have been discussed with the patient and family. After consideration of risks, benefits and other options for treatment, the patient has consented to  Procedure(s): LAPAROSCOPIC REPAIR OF HIATAL HERNIA and Niissen fundoplication (N/A) as a surgical intervention .  The patient's history has been reviewed, patient examined, no change in status, stable for surgery.  I have reviewed the patient's chart and labs.  Questions were answered to the patient's satisfaction.     Marigene Ehlers., Jed Limerick

## 2012-08-10 NOTE — Transfer of Care (Signed)
Immediate Anesthesia Transfer of Care Note  Patient: Angela Bond  Procedure(s) Performed: Procedure(s): LAPAROSCOPIC REPAIR OF HIATAL HERNIA WITH MESH AND EGD WITH PEG TUBE PLACEMENT (N/A)  Patient Location: PACU  Anesthesia Type:General  Level of Consciousness: sedated and patient cooperative  Airway & Oxygen Therapy: Patient Spontanous Breathing and Patient connected to face mask oxygen  Post-op Assessment: Report given to PACU RN and Post -op Vital signs reviewed and stable  Post vital signs: Reviewed and stable  Complications: No apparent anesthesia complications

## 2012-08-10 NOTE — Anesthesia Preprocedure Evaluation (Addendum)
Anesthesia Evaluation  Patient identified by MRN, date of birth, ID band Patient awake    Reviewed: Allergy & Precautions, H&P , NPO status , Patient's Chart, lab work & pertinent test results  Airway Mallampati: II TM Distance: >3 FB Neck ROM: Full    Dental no notable dental hx. (+) Edentulous Upper and Edentulous Lower   Pulmonary neg pulmonary ROS,  breath sounds clear to auscultation  Pulmonary exam normal       Cardiovascular hypertension, Pt. on medications - anginaRhythm:Regular Rate:Normal     Neuro/Psych negative neurological ROS  negative psych ROS   GI/Hepatic Neg liver ROS, hiatal hernia, GERD-  ,  Endo/Other  Morbid obesity  Renal/GU negative Renal ROS  negative genitourinary   Musculoskeletal negative musculoskeletal ROS (+)   Abdominal   Peds negative pediatric ROS (+)  Hematology negative hematology ROS (+)   Anesthesia Other Findings   Reproductive/Obstetrics negative OB ROS                          Anesthesia Physical Anesthesia Plan  ASA: III  Anesthesia Plan: General   Post-op Pain Management:    Induction: Intravenous, Rapid sequence and Cricoid pressure planned  Airway Management Planned: Oral ETT  Additional Equipment:   Intra-op Plan:   Post-operative Plan: Extubation in OR  Informed Consent: I have reviewed the patients History and Physical, chart, labs and discussed the procedure including the risks, benefits and alternatives for the proposed anesthesia with the patient or authorized representative who has indicated his/her understanding and acceptance.   Dental advisory given  Plan Discussed with: CRNA  Anesthesia Plan Comments:         Anesthesia Quick Evaluation

## 2012-08-10 NOTE — Progress Notes (Signed)
UR COMPLETED  

## 2012-08-10 NOTE — H&P (View-Only) (Signed)
Patient ID: Angela Bond, female   DOB: 09/13/1945, 66 y.o.   MRN: [id number]  Chief Complaint  Patient presents with  . Follow-up    discuss results/hernia sx    HPI Angela Bond is a 66 y.o. female.  The patient is a 66-year-old female who is referred for a large hiatal hernia, in herniation of her stomach in her chest. Patient has had this for several months, and complains of dysphagia. She has adapted her diet to be able to eat without regurgitation. Patient has undergone manometry which is inconclusive for the report. She is also not undergone endoscopy which shows no carcinoma. Upper GI reveals large amount of her stomach with her chest.    HPI  Past Medical History  Diagnosis Date  . Arthritis   . Hypertension   . GERD (gastroesophageal reflux disease)   . Hypercholesteremia     Past Surgical History  Procedure Laterality Date  . Total knee arthroplasty    . Brain surgery      Rt br bx  . Eye surgery  2013    tube placed Left  . Esophageal manometry N/A 07/02/2012    Procedure: ESOPHAGEAL MANOMETRY (EM);  Surgeon: Vincent C. Schooler, MD;  Location: [facility];  Service: Endoscopy;  Laterality: N/A;  schooler to read    Family History  Problem Relation Age of Onset  . Heart disease Mother   . Heart disease Father   . Cancer Father     skin  . Cancer Sister     breast    Social History History  Substance Use Topics  . Smoking status: Never Smoker   . Smokeless tobacco: Not on file  . Alcohol Use: No    Allergies  Allergen Reactions  . Aleve (Naproxen)   . Aspirin     Burns stomach  . Ciprofloxacin Nausea And Vomiting  . Doxycycline Nausea And Vomiting  . Evista (Raloxifene)     Leg aches  . Fosamax (Alendronate Sodium)     Leg aches  . Pravachol (Pravastatin)     GI surgery  . Sulfa Antibiotics Nausea Only  . Welchol (Colesevelam Hcl)     Stomach cramps  . Zocor (Simvastatin) Rash    Current Outpatient Prescriptions  Medication Sig  Dispense Refill  . ALPRAZolam (XANAX) 0.25 MG tablet Take 0.25 mg by mouth at bedtime as needed for sleep or anxiety.       . calcium carbonate (OS-CAL) 600 MG TABS Take 600 mg by mouth 2 (two) times daily with a meal.      . Cholecalciferol (VITAMIN D) 2000 UNITS tablet Take 2,000 Units by mouth 2 (two) times daily.       . citalopram (CELEXA) 40 MG tablet Take 40 mg by mouth daily before breakfast.       . fexofenadine (ALLEGRA) 180 MG tablet Take 180 mg by mouth daily.      . hydroxypropyl methylcellulose (ISOPTO TEARS) 2.5 % ophthalmic solution Place 1 drop into both eyes as needed (dry eyes).      . lisinopril (PRINIVIL,ZESTRIL) 20 MG tablet Take 20 mg by mouth daily before breakfast.       . lovastatin (MEVACOR) 40 MG tablet Take 40 mg by mouth daily before breakfast.       . omeprazole (PRILOSEC) 40 MG capsule Take 40 mg by mouth daily.      . Red Yeast Rice Extract (RED YEAST RICE PO) Take 600 mg by mouth daily.       .   SALINE NASAL MIST NA Place 1 spray into the nose 2 (two) times daily.        No current facility-administered medications for this visit.    Review of Systems Review of Systems  Constitutional: Negative.   HENT: Negative.   Eyes: Negative.   Cardiovascular: Negative.   Gastrointestinal: Negative.   Neurological: Negative.     Blood pressure 128/72, pulse 85, temperature 96.9 F (36.1 C), temperature source Temporal, resp. rate 18, height 4' 8" (1.422 m), weight 178 lb 12.8 oz (81.103 kg).  Physical Exam Physical Exam  Constitutional: She is oriented to person, place, and time. She appears well-developed and well-nourished.  HENT:  Head: Normocephalic and atraumatic.  Eyes: Conjunctivae and EOM are normal. Pupils are equal, round, and reactive to light.  Neck: Normal range of motion. Neck supple.  Cardiovascular: Normal rate, regular rhythm and normal heart sounds.   Pulmonary/Chest: Effort normal and breath sounds normal.  Abdominal: Soft. Bowel sounds  are normal. She exhibits no mass. There is no tenderness. There is no rebound and no guarding.  Musculoskeletal: Normal range of motion.  Neurological: She is alert and oriented to person, place, and time.    Data Reviewed As above.  Assessment    66-year-old female with a large hiatal hernia and herniation of her stomach into her chest cavity.     Plan    1. Will proceed to the operating room for laparoscopic hiatal hernia repair with Nissen fundoplication. Patient might also need gastrostomy tube to help anchor the stomach into the abdomen.  2. We discussed the risks and benefits of the surgery to include bleeding, infection, esophageal leak, stenosis of the repair, recurrence of the hiatal hernia, slipped Nissen, MI, stroke, the possibility for need of prolonged intubation postoperatively. The patient and her husband had all questions answered at this time they voiced understanding of these risks and decided to proceed to the operating room for fixation.        Dexter Signor Jr., Doha Boling 07/31/2012, 12:00 PM    

## 2012-08-10 NOTE — Anesthesia Postprocedure Evaluation (Signed)
  Anesthesia Post-op Note  Patient: Angela Bond  Procedure(s) Performed: Procedure(s) (LRB): LAPAROSCOPIC REPAIR OF HIATAL HERNIA WITH MESH AND EGD WITH PEG TUBE PLACEMENT (N/A)  Patient Location: PACU  Anesthesia Type: General  Level of Consciousness: awake and alert   Airway and Oxygen Therapy: Patient Spontanous Breathing  Post-op Pain: mild  Post-op Assessment: Post-op Vital signs reviewed, Patient's Cardiovascular Status Stable, Respiratory Function Stable, Patent Airway and No signs of Nausea or vomiting  Last Vitals:  Filed Vitals:   08/10/12 1337  BP: 120/81  Pulse: 71  Temp: 37.1 C  Resp: 21    Post-op Vital Signs: stable   Complications: No apparent anesthesia complications

## 2012-08-10 NOTE — Op Note (Signed)
Pre Operative Diagnosis:  Type IV Hiatal hernia  Post Operative Diagnosis: same  Procedure: laparoscopic hiatal hernia repair with mesh, esophagogastroduodenoscopy, PEG tube placement  Surgeon: Dr. Axel Filler  Assistant: Dr. Abbey Chatters  Anesthesia: GETA  EBL: 25 cc  Complications: none  Counts: reported as correct x 2  Findings:  The patient will a large portion of her stomach into her right chest. She large hiatal hernia and approximately 3-4 cm in diameter. This was brought together in place a Stratus mesh was stapled to the repair. PEG tube was placed under direct vision, a negative intra-abdominal wall.  Indications for procedure:  The patient is a 67 year old female who is seen in clinic secondary to dysphasia a decreased tolerance to by mouth food secondary to reflux symptoms. The patient was studied and found to have a large hernia type IV, herniation her right chest up on her stomach. The patient was worked up preoperatively and decided to proceed with elective laparoscopic hernia repair and PEG tube placement.  Details of the procedure:The patient was taken back to the operating room. The patient was placed in supine position with bilateral SCDs in place. After appropriate anitbiotics were confirmed, a time-out was confirmed and all facts were verified.  A pneumoperitoneum of 14 mmHg was obtained the Veress needle  technique in the left subcostal margin at Palmer's point.  A 5 mm trocar and camera or introduced in the abdomen there were no injury to any intra-abdominal organs.  Subsequent working ports were then placed in the epigastrium, the edge of the falciform ligament, inferior to the umbilicus, and left lower quadrant. These were all 5 mm ports. The patient was positioned, and the stomach was to be reduced into the abdomen. There was large amount of adhesions, and a hernia sac that a difficult to reduce into the abdomen. I proceeded to identify the right crus after incising  the gastrohepatic omentum. The right crus was identified from the edge of the diaphragm down to the base. The hernia sac was identified this was reduced into the abdomen. We dissected away the phrenoesophageal fat pad. The left crus was then identified.  Dissection from the diaphragm down to the base of the left crus was undertaken. The hernia sac was again taken down from the chest and abdomen on the left side of the esophagus. We then proceeded to dissect posteriorly that time the posterior vagus nerve identified and preserved this structure. All remaining adhesions were then taken down posteriorly and were able to place a Penrose drain around the esophagus. This was anchored together with a Endoloop. This was sewn to retract into the abdomen. We examined the hiatus a minimal amount of adhesions were present were taken down sharply with Bovie cautery.   At this point the stomach was retracted inferiorly 2-0 Ethibonds were then used toreapproximate the hiatus.  4 Ethibonds were used in interrupted fashion. This allowed for a loosely approximated hiatus. At this time a piece of stress was then cut into shape and placed over the hiatus repair. The inferior umbilical port was then isotope in her port.  This was anchored to the diaphragm using a 4.8 mm staples, via the Covieden hernia stapler.  At this time the stomach lay within the abdomen without any undue Resistance.  At this time represented to the PEG tube placement. An EGD scope was then placed into the oral cavity down to the esophagus into the stomach. This was visualized using laparoscopy. Using a percutaneous technique/Seldinger technique a 20  French PEG tube was then pulled into the stomach and anchored to the anterior abdominal wall under direct visualization. See to visualize the abdomen via the laparoscope, there was no bleeding that could be visualized the area of dissection was dry. We evacuated the pneumoperitoneum. All trochars were removed under  direct visualization after the retractor was removed. The skin was every proximal port site using 4-0 Monocryl in a soft fashion a 12 mm port site was then reapproximated using a 0 Vicryl on a UR 6. There area was then dressed with Dermabond. 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 Patient was awakened from anesthesia and taken to recovery in stable condition.

## 2012-08-10 NOTE — Preoperative (Signed)
Beta Blockers   Reason not to administer Beta Blockers:Not Applicable 

## 2012-08-11 ENCOUNTER — Inpatient Hospital Stay (HOSPITAL_COMMUNITY): Payer: Medicare Other

## 2012-08-11 LAB — BASIC METABOLIC PANEL
BUN: 11 mg/dL (ref 6–23)
CO2: 29 mEq/L (ref 19–32)
Calcium: 8.1 mg/dL — ABNORMAL LOW (ref 8.4–10.5)
Creatinine, Ser: 0.73 mg/dL (ref 0.50–1.10)
Glucose, Bld: 152 mg/dL — ABNORMAL HIGH (ref 70–99)

## 2012-08-11 LAB — CBC
MCH: 29.4 pg (ref 26.0–34.0)
MCHC: 33 g/dL (ref 30.0–36.0)
MCV: 89 fL (ref 78.0–100.0)
Platelets: 181 10*3/uL (ref 150–400)
RDW: 14.1 % (ref 11.5–15.5)
WBC: 10.3 10*3/uL (ref 4.0–10.5)

## 2012-08-11 MED ORDER — ACETAMINOPHEN 325 MG PO TABS: 325.0000 mg | ORAL_TABLET | Freq: Four times a day (QID) | ORAL | Status: DC | PRN

## 2012-08-11 MED ORDER — IOHEXOL 300 MG/ML  SOLN
50.0000 mL | Freq: Once | INTRAMUSCULAR | Status: AC | PRN
  Administered 2012-08-11: 50 mL via ORAL

## 2012-08-11 MED ORDER — ACETAMINOPHEN 650 MG RE SUPP
325.0000 mg | Freq: Four times a day (QID) | RECTAL | Status: DC | PRN
  Administered 2012-08-11 – 2012-08-13 (×2): 650 mg via RECTAL
  Filled 2012-08-11 (×2): qty 1

## 2012-08-11 MED ORDER — FUROSEMIDE 10 MG/ML IJ SOLN
40.0000 mg | Freq: Once | INTRAMUSCULAR | Status: AC
  Administered 2012-08-11: 40 mg via INTRAVENOUS
  Filled 2012-08-11: qty 4

## 2012-08-11 MED ORDER — LACTATED RINGERS IV BOLUS (SEPSIS): 1000.0000 mL | Freq: Three times a day (TID) | INTRAVENOUS | Status: DC | PRN

## 2012-08-11 NOTE — Progress Notes (Signed)
Dr Michaell Cowing notified patient has temperature of 101.9.  Patient has been slow to mobilize. Discussed patient oxygen needs and oxygen levels.  Orders received at this time.

## 2012-08-11 NOTE — Progress Notes (Signed)
1 Day Post-Op  Subjective: Pt with no c/o. con't to work with IS.  Ambulating well.  Some abd soreness  Objective: Vital signs in last 24 hours: Temp:  [97.5 F (36.4 C)-100.4 F (38 C)] 99.3 F (37.4 C) (03/29 0510) Pulse Rate:  [70-84] 75 (03/29 0510) Resp:  [10-25] 16 (03/29 0510) BP: (101-134)/(64-99) 111/71 mmHg (03/29 0510) SpO2:  [91 %-99 %] 96 % (03/29 0510) Weight:  [179 lb (81.195 kg)] 179 lb (81.195 kg) (03/28 1237)    Intake/Output from previous day: 03/28 0701 - 03/29 0700 In: 2620 [I.V.:2620] Out: 950 [Urine:900; Blood:50] Intake/Output this shift:    General appearance: alert and cooperative Cardio: regular rate and rhythm, S1, S2 normal, no murmur, click, rub or gallop GI: wounds c/d/i, s/nt/nd, active BS  Lab Results:   Recent Labs  08/10/12 1311 08/11/12 0430  WBC 12.1* 10.3  HGB 12.3 10.7*  HCT 37.4 32.4*  PLT 145* 181   BMET  Recent Labs  08/10/12 1311 08/11/12 0430  NA  --  138  K  --  3.7  CL  --  103  CO2  --  29  GLUCOSE  --  152*  BUN  --  11  CREATININE 0.78 0.73  CALCIUM  --  8.1*   PT/INR No results found for this basename: LABPROT, INR,  in the last 72 hours ABG No results found for this basename: PHART, PCO2, PO2, HCO3,  in the last 72 hours  Studies/Results: Dg Ugi W/water Sol Cm  08/10/2012  *RADIOLOGY REPORT*  Clinical Data:Status post hiatal hernia repair  ESOPHAGUS/BARIUM SWALLOW/TABLET STUDY  Technique: After obtaining a scout radiograph, upper GI series performed with high density barium and effervescent agent. Thin barium also used.  Fluoroscopy Time: 33 seconds  Comparison: 05/25/12  Findings: The patient ingested approximately 50 ml of water soluble contrast material.  This promptly passed through the esophagus and into the intra-abdominal stomach.  No extravasation of contrast material identified to suggest leak.  The GE junction now appears to be in the appropriate anatomic location.  IMPRESSION:  1.  No  complications after repair of large hiatal hernia. 2.  No extravasation of contrast material identified to suggest leak.   Original Report Authenticated By: Signa Kell, M.D.     Anti-infectives: Anti-infectives   Start     Dose/Rate Route Frequency Ordered Stop   08/10/12 0531  ertapenem (INVANZ) 1 g in sodium chloride 0.9 % 50 mL IVPB     1 g 100 mL/hr over 30 Minutes Intravenous On call to O.R. 08/10/12 0531 08/10/12 0734      Assessment/Plan: s/p Procedure(s): LAPAROSCOPIC REPAIR OF HIATAL HERNIA WITH MESH AND EGD WITH PEG TUBE PLACEMENT (N/A) F/u UGI today I discussed with Radiologist this AM in regards to getting it done- if no leak will start with liquids.  She will be on liquids for 2 weeks.  Pt request a dietician to discuss with her the options when she leaves.  Ambulate TID  LOS: 1 day    Marigene Ehlers., [facility] 08/11/2012

## 2012-08-12 ENCOUNTER — Inpatient Hospital Stay (HOSPITAL_COMMUNITY): Payer: Medicare Other

## 2012-08-12 DIAGNOSIS — I059 Rheumatic mitral valve disease, unspecified: Secondary | ICD-10-CM

## 2012-08-12 DIAGNOSIS — I10 Essential (primary) hypertension: Secondary | ICD-10-CM | POA: Insufficient documentation

## 2012-08-12 DIAGNOSIS — E876 Hypokalemia: Secondary | ICD-10-CM

## 2012-08-12 DIAGNOSIS — I480 Paroxysmal atrial fibrillation: Secondary | ICD-10-CM | POA: Diagnosis not present

## 2012-08-12 DIAGNOSIS — I4891 Unspecified atrial fibrillation: Secondary | ICD-10-CM

## 2012-08-12 LAB — BASIC METABOLIC PANEL
CO2: 29 mEq/L (ref 19–32)
Chloride: 97 mEq/L (ref 96–112)
Creatinine, Ser: 0.77 mg/dL (ref 0.50–1.10)
GFR calc Af Amer: >90 mL/min (ref >90)
Sodium: 136 mEq/L (ref 135–145)

## 2012-08-12 LAB — CBC
MCV: 89.6 fL (ref 78.0–100.0)
Platelets: 160 10*3/uL (ref 150–400)
RBC: 3.94 MIL/uL (ref 3.87–5.11)
RDW: 14.5 % (ref 11.5–15.5)
WBC: 13 10*3/uL — ABNORMAL HIGH (ref 4.0–10.5)

## 2012-08-12 LAB — TROPONIN I: Troponin I: <0.3 ng/mL (ref <0.30)

## 2012-08-12 LAB — MAGNESIUM: Magnesium: 1.7 mg/dL (ref 1.5–2.5)

## 2012-08-12 MED ORDER — POTASSIUM CHLORIDE CRYS ER 20 MEQ PO TBCR
40.0000 meq | EXTENDED_RELEASE_TABLET | Freq: Once | ORAL | Status: AC
  Administered 2012-08-12: 40 meq via ORAL
  Filled 2012-08-12: qty 2

## 2012-08-12 MED ORDER — ETODOLAC 500 MG PO TABS: 500.0000 mg | ORAL_TABLET | Freq: Two times a day (BID) | ORAL | Status: DC

## 2012-08-12 MED ORDER — DILTIAZEM HCL 100 MG IV SOLR
5.0000 mg/h | INTRAVENOUS | Status: DC
  Administered 2012-08-12: 5 mg/h via INTRAVENOUS
  Filled 2012-08-12: qty 100

## 2012-08-12 MED ORDER — POTASSIUM CHLORIDE 10 MEQ/100ML IV SOLN
10.0000 meq | INTRAVENOUS | Status: AC
  Administered 2012-08-12 (×4): 10 meq via INTRAVENOUS
  Filled 2012-08-12: qty 400

## 2012-08-12 MED ORDER — CITALOPRAM HYDROBROMIDE 40 MG PO TABS
40.0000 mg | ORAL_TABLET | Freq: Every day | ORAL | Status: DC
  Administered 2012-08-12 – 2012-08-14 (×3): 40 mg via ORAL
  Filled 2012-08-12 (×4): qty 1

## 2012-08-12 MED ORDER — SODIUM CHLORIDE 0.9 % IV BOLUS (SEPSIS)
500.0000 mL | Freq: Once | INTRAVENOUS | Status: AC
  Administered 2012-08-12: 500 mL via INTRAVENOUS

## 2012-08-12 MED ORDER — POTASSIUM CHLORIDE 20 MEQ PO PACK
40.0000 meq | PACK | Freq: Once | ORAL | Status: DC
  Filled 2012-08-12: qty 2

## 2012-08-12 MED ORDER — BOOST / RESOURCE BREEZE PO LIQD
1.0000 | Freq: Three times a day (TID) | ORAL | Status: DC
  Administered 2012-08-12 – 2012-08-14 (×5): 1 via ORAL

## 2012-08-12 MED ORDER — METOPROLOL TARTRATE 12.5 MG HALF TABLET
12.5000 mg | ORAL_TABLET | Freq: Two times a day (BID) | ORAL | Status: DC
  Filled 2012-08-12 (×2): qty 1

## 2012-08-12 MED ORDER — METOPROLOL TARTRATE 12.5 MG HALF TABLET
12.5000 mg | ORAL_TABLET | Freq: Two times a day (BID) | ORAL | Status: DC
  Administered 2012-08-12: 12.5 mg via ORAL
  Filled 2012-08-12 (×4): qty 1

## 2012-08-12 MED ORDER — ETODOLAC 300 MG PO CAPS
500.0000 mg | ORAL_CAPSULE | Freq: Two times a day (BID) | ORAL | Status: DC
  Administered 2012-08-12 – 2012-08-14 (×4): 500 mg via ORAL
  Filled 2012-08-12 (×7): qty 1

## 2012-08-12 MED ORDER — LACTATED RINGERS IV BOLUS (SEPSIS)
500.0000 mL | Freq: Once | INTRAVENOUS | Status: AC
  Administered 2012-08-12: 500 mL via INTRAVENOUS

## 2012-08-12 MED ORDER — POTASSIUM CHLORIDE CRYS ER 20 MEQ PO TBCR: 40.0000 meq | EXTENDED_RELEASE_TABLET | Freq: Every day | ORAL | Status: DC

## 2012-08-12 NOTE — Progress Notes (Signed)
Pt converted back to sinus at 0515. Cardizem stopped at 0615 due to hypotension. Dr. Michaell Cowing has been informed.

## 2012-08-12 NOTE — Progress Notes (Signed)
INITIAL NUTRITION ASSESSMENT  DOCUMENTATION CODES Per approved criteria  -Not Applicable   INTERVENTION: - Patient educated on following a liquid diet after discharge. She was encouraged to drink a nutrition supplement (Ensure, Boost, [employer]) 2-3 times daily and include higher protein liquids. Teach back method used. Handout provided.  - Resource Breeze TID between meals.   NUTRITION DIAGNOSIS: Inadequate oral intake related to hiatal hernia as evidenced by clear liquid diet.   Goal: Patient will meet >/=90% of estimated nutrition needs.   Monitor:  PO intake, weight, labs  Reason for Assessment: Consult, Diet education  67 y.o. female  Admitting Dx: Sliding hiatal hernia  ASSESSMENT: Patient is s/p hiatal hernia repair. Per MD, she will be on a liquid diet for 2 weeks after discharge, followed by a soft diet. Patient requesting education on food choices. Current diet is clear liquids, with 50% intake. She reported dysphagia, poor appetite prior to admission.  Height: Ht Readings from Last 1 Encounters:  08/12/12 5\' 8"  (1.727 m)    Weight: Wt Readings from Last 1 Encounters:  08/12/12 181 lb 10.5 oz (82.4 kg)    Ideal Body Weight: 63.6 kg  % Ideal Body Weight: 130%  Wt Readings from Last 10 Encounters:  08/12/12 181 lb 10.5 oz (82.4 kg)  08/12/12 181 lb 10.5 oz (82.4 kg)  08/01/12 179 lb (81.194 kg)  07/31/12 178 lb 12.8 oz (81.103 kg)  07/02/12 178 lb (80.74 kg)  07/02/12 178 lb (80.74 kg)  06/08/12 178 lb 9.6 oz (81.012 kg)  01/23/12 178 lb (80.74 kg)  01/23/12 178 lb (80.74 kg)    Usual Body Weight: 81.8 kg  % Usual Body Weight: 101%  BMI:  Body mass index is 27.63 kg/(m^2).  Patient is overweight.   Estimated Nutritional Needs: Kcal: 1700-1850 kcal Protein: 100-115 g Fluid: >2.4 L  Skin: 5 abdominal incisions  Diet Order: Clear Liquid  EDUCATION NEEDS: -Education needs addressed   Intake/Output Summary (Last 24 hours)  at 08/12/12 1253 Last data filed at 08/12/12 0800  Gross per 24 hour  Intake 2169.83 ml  Output   3675 ml  Net -1505.17 ml    Last BM: PTA   Labs:   Recent Labs Lab 08/10/12 1311 08/11/12 0430 08/12/12 0131 08/12/12 0733  NA  --  138 136  --   K  --  3.7 3.2*  --   CL  --  103 97  --   CO2  --  29 29  --   BUN  --  11 10  --   CREATININE 0.78 0.73 0.77  --   CALCIUM  --  8.1* 8.4  --   MG  --   --   --  1.7  GLUCOSE  --  152* 114*  --     CBG (last 3)  No results found for this basename: GLUCAP,  in the last 72 hours  Scheduled Meds: . citalopram  40 mg Oral QAC breakfast  . enoxaparin (LOVENOX) injection  40 mg Subcutaneous Q24H  . etodolac  500 mg Oral BID  . lisinopril  20 mg Oral Daily  . metoprolol tartrate  12.5 mg Oral BID    Continuous Infusions: . dextrose 5 % and 0.9% NaCl 100 mL/hr at 08/11/12 2059  . diltiazem (CARDIZEM) infusion Stopped (08/12/12 5784)    Past Medical History  Diagnosis Date  . Arthritis   . Hypertension   . GERD (gastroesophageal reflux disease)   . Hypercholesteremia   .  Heart murmur   . Depression   . H/O hiatal hernia   . Shortness of breath     "stomach is in lungs"  . Headache     sinus    Past Surgical History  Procedure Laterality Date  . Total knee arthroplasty    . Eye surgery  2013    tube placed Left  . Esophageal manometry N/A 07/02/2012    Procedure: ESOPHAGEAL MANOMETRY (EM);  Surgeon: Shirley Friar, MD;  Location: [facility];  Service: Endoscopy;  Laterality: N/A;  schooler to read  . Joint replacement Bilateral 2011,2012    knees  . Breast surgery      r breast biopsy  . Tonsillectomy  as child    Linnell Fulling, RD, LDN Pager #: [phone] After-Hours Pager #: [phone]

## 2012-08-12 NOTE — Progress Notes (Signed)
Attempted to call patient husband Cheyene Hamric at home and cell #.  No answer received at either number.  Patient made aware will pass information along to stepdown nurse for followup

## 2012-08-12 NOTE — Progress Notes (Signed)
RRT called to room 0130 concerning pt elevated HR, EKG done prior to my arrival showing aflutter. Pt post hiatal hernia repiar 3/28. Alert and oriented x 4, complains of mild pain 3/10 surgical in upper ABD. Respirations WNL, lungs diminished bilateral, 2 LNC o2 sats 93-95, no SOB. Verlon Au RN spoke with Dr Michaell Cowing concerning pt at 0120. HR, Cardizem gtt, labs, and PCXR ordered stat. Cardizem gtt started 5mg /hr at 0137, labs done. Titrated to 10mg /hr at 0150. Pt to transfer to SD room 1222 see flowsheet for VS.

## 2012-08-12 NOTE — Progress Notes (Signed)
2 Days Post-Op  Subjective: Had some discomfort with swallowing yesterday.  Events of last night noted.  No significant abdominal pain.  Objective: Vital signs in last 24 hours: Temp:  [99.5 F (37.5 C)-101.9 F (38.8 C)] 99.9 F (37.7 C) (03/30 0400) Pulse Rate:  [70-155] 78 (03/30 0700) Resp:  [14-21] 17 (03/30 0700) BP: (89-122)/(53-97) 95/53 mmHg (03/30 0700) SpO2:  [92 %-98 %] 98 % (03/30 0700) Weight:  [181 lb 10.5 oz (82.4 kg)] 181 lb 10.5 oz (82.4 kg) (03/30 0240) Last BM Date: 08/09/12  Intake/Output from previous day: 03/29 0701 - 03/30 0700 In: 3769.8 [P.O.:120; I.V.:3649.8] Out: 3725 [Urine:3725] Intake/Output this shift:    PE: General- Appears comfortable and is in NAD CV-RRR Lungs-significantly decreased breath sounds in the bases Abd-soft, incisions clean and intact, G-tube in LUQ  Lab Results:   Recent Labs  08/11/12 0430 08/12/12 0131  WBC 10.3 13.0*  HGB 10.7* 11.4*  HCT 32.4* 35.3*  PLT 181 160   BMET  Recent Labs  08/11/12 0430 08/12/12 0131  NA 138 136  K 3.7 3.2*  CL 103 97  CO2 29 29  GLUCOSE 152* 114*  BUN 11 10  CREATININE 0.73 0.77  CALCIUM 8.1* 8.4   PT/INR No results found for this basename: LABPROT, INR,  in the last 72 hours Comprehensive Metabolic Panel:    Component Value Date/Time   NA 136 08/12/2012 0131   K 3.2* 08/12/2012 0131   CL 97 08/12/2012 0131   CO2 29 08/12/2012 0131   BUN 10 08/12/2012 0131   CREATININE 0.77 08/12/2012 0131   GLUCOSE 114* 08/12/2012 0131   CALCIUM 8.4 08/12/2012 0131   AST 19 01/14/2012 0714   ALT 13 01/14/2012 0714   ALKPHOS 71 01/14/2012 0714   BILITOT 0.3 01/14/2012 0714   PROT 7.6 01/14/2012 0714   ALBUMIN 3.5 01/14/2012 1610     Studies/Results: Dg Chest Port 1 View  08/12/2012  **ADDENDUM** CREATED: 08/12/2012 07:07:54  The retrocardiac opacities are most likely related to consolidation and pleural fluid.  The patient recently had a hiatal hernia repair.  A recurrent hiatal hernia  cannot be excluded but these findings are nonspecific.  **END ADDENDUM** SIGNED BY: Richarda Overlie, M.D.   08/12/2012  *RADIOLOGY REPORT*  Clinical Data: Rapid heart rate.  Cough and congestion.  PORTABLE CHEST - 1 VIEW  Comparison: Chest x-ray 08/01/2012.  Findings: Lung volumes are low.  Left lower lobe atelectasis and/or consolidation.  Linear opacity in the right mid lung as compatible with subsegmental atelectasis.  Possible small left pleural effusion.  Pulmonary vasculature is within normal limits.  Heart size is normal.  Retrocardiac opacity is again compatible with a large hiatal hernia.  The upper mediastinal contours are unremarkable.  IMPRESSION: 1.  Left lower lobe atelectasis and/or consolidation with small left pleural effusion. 2.  Subsegmental atelectasis in the right mid lung. 3.  Large hiatal hernia.   Original Report Authenticated By: Trudie Reed, M.D.    Dg Kayleen Memos W/water Sol Cm  08/11/2012  *RADIOLOGY REPORT*  Clinical Data:Status post hiatal hernia repair yesterday.  1 day postoperative evaluation to exclude perforation.  ESOPHAGUS/BARIUM SWALLOW/TABLET STUDY  Fluoroscopy Time: 1 minute 11 seconds.  Comparison: Yesterday.  Findings: The patient swallowed 50 ml of Omnipaque-300 without difficulty.  This passed normally through the esophagus into the stomach with a more rounded, dense configuration at the location of the PEG tube balloon in the stomach, also seen yesterday.  No contrast extravasation was demonstrated.  The gastroesophageal junction remains in a normal position.  IMPRESSION: GE junction in a normal location with no perforation.   Original Report Authenticated By: Beckie Salts, M.D.    Dg Kayleen Memos W/water Sol Cm  08/10/2012  *RADIOLOGY REPORT*  Clinical Data:Status post hiatal hernia repair  ESOPHAGUS/BARIUM SWALLOW/TABLET STUDY  Technique: After obtaining a scout radiograph, upper GI series performed with high density barium and effervescent agent. Thin barium also used.   Fluoroscopy Time: 33 seconds  Comparison: 05/25/12  Findings: The patient ingested approximately 50 ml of water soluble contrast material.  This promptly passed through the esophagus and into the intra-abdominal stomach.  No extravasation of contrast material identified to suggest leak.  The GE junction now appears to be in the appropriate anatomic location.  IMPRESSION:  1.  No complications after repair of large hiatal hernia. 2.  No extravasation of contrast material identified to suggest leak.   Original Report Authenticated By: Signa Kell, M.D.     Anti-infectives: Anti-infectives   Start     Dose/Rate Route Frequency Ordered Stop   08/10/12 0531  ertapenem (INVANZ) 1 g in sodium chloride 0.9 % 50 mL IVPB     1 g 100 mL/hr over 30 Minutes Intravenous On call to O.R. 08/10/12 0531 08/10/12 0734      Assessment Principal Problem:  1. Sliding hiatal hernia s/p lap PEH repair-no leak or evidence of recurrent HH on two UGIs.    2.  Afib-converted to NSR earlier this AM; Troponin normal; Cardiology consult requested.   3.  Fever-most likely due to atelectasis as seen on CXR   4.  Hypokalemia-replacement ordered    LOS: 2 days   Plan: OOB and ambulate.  Keep on clear liquids.  Recheck electrolytes tomorrow.   Angela Bond J 08/12/2012

## 2012-08-12 NOTE — Progress Notes (Signed)
Called by nurse multiple times on this patient.  Patient with mild fever.  Recommended Tylenol.  On 4 L of oxygen.  Recommended med-locking IVF & single dose of of Lasix.  Several hours later, patient went into atrial fibrillation.  As far as we can tell, this is the first episode equals new onset atrial fibrillation.  Not hypotensive.  Recommended diltiazem drip per protocol and transfer to step down unit vs. Telemetry.  This has been done.  Called again: patient had converted into sinus rhythm.  Mildly hypotensive.  Recommended trying to switch over to oral metoprolol after an IV fluid bolus given.  Wean off of diltiazem drip.  Potassium low - ordered to replace.  We will also check for magnesium.  I have called a cardiac consultation as well.  Dr. Antoine Poche is covering for Dr. Anne Fu whom saw the patient in September 2013.  His team will see the patient this morning and offer further insight.  Oncoming surgeon, Dr. Abbey Chatters, notified as well.

## 2012-08-12 NOTE — Consult Note (Addendum)
Reason for Consult: Paroxysmal atrial fibrillation Referring Physician: Dr. Karie Soda Primary cardiologist Dr. Janeann Forehand Angela Bond is an 67 y.o. female.  HPI: This 67 year old woman is being seen because of paroxysmal atrial fibrillation.  She underwent laparoscopic hiatal hernia repair with mesh, esophagogastroduodenoscopy, and PEG tube placement by Dr. Derrell Lolling on 08/10/12.  The patient's postoperative course was unremarkable until last evening when she went into atrial fibrillation with rapid ventricular response.  She was moved down to step down.  She was treated with intravenous diltiazem drip per protocol and converted to normal sinus rhythm with frequent premature atrial contractions.  She was found to be hypokalemic with potassium of 3.2 and her potassium is being repleted.  Serum magnesium is normal at 1.7.  Portable chest x-ray done last night shows lung volumes are low and there is left lower lobe atelectasis and heart size was normal.  EKG showed atrial fibrillation with rapid ventricular response but no ischemic changes. She does not have any history of previous episodes of known atrial fibrillation.  She has a past history of chest discomfort and had cardiac catheterization in 2013 by Dr. Anne Fu which showed minimal nonobstructive disease and normal left ventricular systolic function with ejection fraction of 60%.  The patient does not have any history of thyroid problems.  Past Medical History  Diagnosis Date  . Arthritis   . Hypertension   . GERD (gastroesophageal reflux disease)   . Hypercholesteremia   . Heart murmur   . Depression   . H/O hiatal hernia   . Shortness of breath     "stomach is in lungs"  . Headache     sinus    Past Surgical History  Procedure Laterality Date  . Total knee arthroplasty    . Eye surgery  2013    tube placed Left  . Esophageal manometry N/A 07/02/2012    Procedure: ESOPHAGEAL MANOMETRY (EM);  Surgeon: Shirley Friar, MD;   Location: [facility];  Service: Endoscopy;  Laterality: N/A;  schooler to read  . Joint replacement Bilateral 2011,2012    knees  . Breast surgery      r breast biopsy  . Tonsillectomy  as child    Family History  Problem Relation Age of Onset  . Heart disease Mother   . Heart disease Father   . Cancer Father     skin  . Cancer Sister     breast    Social History:  reports that she has never smoked. She has never used smokeless tobacco. She reports that she does not drink alcohol or use illicit drugs.  Allergies:  Allergies  Allergen Reactions  . Aleve (Naproxen)   . Aspirin     Burns stomach  . Ciprofloxacin Nausea And Vomiting  . Doxycycline Nausea And Vomiting  . Evista (Raloxifene)     Leg aches  . Fosamax (Alendronate Sodium)     Leg aches  . Pravachol (Pravastatin)     GI surgery  . Sulfa Antibiotics Nausea Only  . Welchol (Colesevelam Hcl)     Stomach cramps  . Zocor (Simvastatin) Rash    Medications:  Scheduled: . citalopram  40 mg Oral QAC breakfast  . enoxaparin (LOVENOX) injection  40 mg Subcutaneous Q24H  . etodolac  500 mg Oral BID  . lisinopril  20 mg Oral Daily  . metoprolol tartrate  12.5 mg Oral BID  . potassium chloride  10 mEq Intravenous Q1 Hr x 4    Results for  orders placed during the hospital encounter of 08/10/12 (from the past 48 hour(s))  CBC     Status: Abnormal   Collection Time    08/10/12  1:11 PM      Result Value Range   WBC 12.1 (*) 4.0 - 10.5 K/uL   RBC 4.21  3.87 - 5.11 MIL/uL   Hemoglobin 12.3  12.0 - 15.0 g/dL   HCT 16.1  09.6 - 04.5 %   MCV 88.8  78.0 - 100.0 fL   MCH 29.2  26.0 - 34.0 pg   MCHC 32.9  30.0 - 36.0 g/dL   RDW 40.9  81.1 - 91.4 %   Platelets 145 (*) 150 - 400 K/uL  CREATININE, SERUM     Status: Abnormal   Collection Time    08/10/12  1:11 PM      Result Value Range   Creatinine, Ser 0.78  0.50 - 1.10 mg/dL   GFR calc non Af Amer 85 (*) >90 mL/min   GFR calc Af Amer >90  >90 mL/min   Comment:             The eGFR has been calculated     using the CKD EPI equation.     This calculation has not been     validated in all clinical     situations.     eGFR's persistently     <90 mL/min signify     possible Chronic Kidney Disease.  BASIC METABOLIC PANEL     Status: Abnormal   Collection Time    08/11/12  4:30 AM      Result Value Range   Sodium 138  135 - 145 mEq/L   Potassium 3.7  3.5 - 5.1 mEq/L   Chloride 103  96 - 112 mEq/L   CO2 29  19 - 32 mEq/L   Glucose, Bld 152 (*) 70 - 99 mg/dL   BUN 11  6 - 23 mg/dL   Creatinine, Ser 7.82  0.50 - 1.10 mg/dL   Calcium 8.1 (*) 8.4 - 10.5 mg/dL   GFR calc non Af Amer 86 (*) >90 mL/min   GFR calc Af Amer >90  >90 mL/min   Comment:            The eGFR has been calculated     using the CKD EPI equation.     This calculation has not been     validated in all clinical     situations.     eGFR's persistently     <90 mL/min signify     possible Chronic Kidney Disease.  CBC     Status: Abnormal   Collection Time    08/11/12  4:30 AM      Result Value Range   WBC 10.3  4.0 - 10.5 K/uL   RBC 3.64 (*) 3.87 - 5.11 MIL/uL   Hemoglobin 10.7 (*) 12.0 - 15.0 g/dL   HCT 95.6 (*) 21.3 - 08.6 %   MCV 89.0  78.0 - 100.0 fL   MCH 29.4  26.0 - 34.0 pg   MCHC 33.0  30.0 - 36.0 g/dL   RDW 57.8  46.9 - 62.9 %   Platelets 181  150 - 400 K/uL  CBC     Status: Abnormal   Collection Time    08/12/12  1:31 AM      Result Value Range   WBC 13.0 (*) 4.0 - 10.5 K/uL   RBC 3.94  3.87 - 5.11 MIL/uL  Hemoglobin 11.4 (*) 12.0 - 15.0 g/dL   HCT 16.1 (*) 09.6 - 04.5 %   MCV 89.6  78.0 - 100.0 fL   MCH 28.9  26.0 - 34.0 pg   MCHC 32.3  30.0 - 36.0 g/dL   RDW 40.9  81.1 - 91.4 %   Platelets 160  150 - 400 K/uL  BASIC METABOLIC PANEL     Status: Abnormal   Collection Time    08/12/12  1:31 AM      Result Value Range   Sodium 136  135 - 145 mEq/L   Potassium 3.2 (*) 3.5 - 5.1 mEq/L   Chloride 97  96 - 112 mEq/L   CO2 29  19 - 32 mEq/L   Glucose,  Bld 114 (*) 70 - 99 mg/dL   BUN 10  6 - 23 mg/dL   Creatinine, Ser 7.82  0.50 - 1.10 mg/dL   Calcium 8.4  8.4 - 95.6 mg/dL   GFR calc non Af Amer 85 (*) >90 mL/min   GFR calc Af Amer >90  >90 mL/min   Comment:            The eGFR has been calculated     using the CKD EPI equation.     This calculation has not been     validated in all clinical     situations.     eGFR's persistently     <90 mL/min signify     possible Chronic Kidney Disease.  TROPONIN I     Status: None   Collection Time    08/12/12  1:31 AM      Result Value Range   Troponin I <0.30  <0.30 ng/mL   Comment:            Due to the release kinetics of cTnI,     a negative result within the first hours     of the onset of symptoms does not rule out     myocardial infarction with certainty.     If myocardial infarction is still suspected,     repeat the test at appropriate intervals.  MAGNESIUM     Status: None   Collection Time    08/12/12  7:33 AM      Result Value Range   Magnesium 1.7  1.5 - 2.5 mg/dL    Dg Chest Port 1 View  08/12/2012  **ADDENDUM** CREATED: 08/12/2012 07:07:54  The retrocardiac opacities are most likely related to consolidation and pleural fluid.  The patient recently had a hiatal hernia repair.  A recurrent hiatal hernia cannot be excluded but these findings are nonspecific.  **END ADDENDUM** SIGNED BY: Richarda Overlie, M.D.   08/12/2012  *RADIOLOGY REPORT*  Clinical Data: Rapid heart rate.  Cough and congestion.  PORTABLE CHEST - 1 VIEW  Comparison: Chest x-ray 08/01/2012.  Findings: Lung volumes are low.  Left lower lobe atelectasis and/or consolidation.  Linear opacity in the right mid lung as compatible with subsegmental atelectasis.  Possible small left pleural effusion.  Pulmonary vasculature is within normal limits.  Heart size is normal.  Retrocardiac opacity is again compatible with a large hiatal hernia.  The upper mediastinal contours are unremarkable.  IMPRESSION: 1.  Left lower lobe  atelectasis and/or consolidation with small left pleural effusion. 2.  Subsegmental atelectasis in the right mid lung. 3.  Large hiatal hernia.   Original Report Authenticated By: Trudie Reed, M.D.    Dg Ugi W/water Sol Cm  08/11/2012  *  RADIOLOGY REPORT*  Clinical Data:Status post hiatal hernia repair yesterday.  1 day postoperative evaluation to exclude perforation.  ESOPHAGUS/BARIUM SWALLOW/TABLET STUDY  Fluoroscopy Time: 1 minute 11 seconds.  Comparison: Yesterday.  Findings: The patient swallowed 50 ml of Omnipaque-300 without difficulty.  This passed normally through the esophagus into the stomach with a more rounded, dense configuration at the location of the PEG tube balloon in the stomach, also seen yesterday.  No contrast extravasation was demonstrated.  The gastroesophageal junction remains in a normal position.  IMPRESSION: GE junction in a normal location with no perforation.   Original Report Authenticated By: Beckie Salts, M.D.    Dg Kayleen Memos W/water Sol Cm  08/10/2012  *RADIOLOGY REPORT*  Clinical Data:Status post hiatal hernia repair  ESOPHAGUS/BARIUM SWALLOW/TABLET STUDY  Technique: After obtaining a scout radiograph, upper GI series performed with high density barium and effervescent agent. Thin barium also used.  Fluoroscopy Time: 33 seconds  Comparison: 05/25/12  Findings: The patient ingested approximately 50 ml of water soluble contrast material.  This promptly passed through the esophagus and into the intra-abdominal stomach.  No extravasation of contrast material identified to suggest leak.  The GE junction now appears to be in the appropriate anatomic location.  IMPRESSION:  1.  No complications after repair of large hiatal hernia. 2.  No extravasation of contrast material identified to suggest leak.   Original Report Authenticated By: Signa Kell, M.D.     Her review of systems is negative except for present illness.  Prior to this admission the patient was experiencing some  exertional dyspnea which she attributed to her large hiatal hernia crowding out her lungs.  She has not had any recent chest discomfort Blood pressure 95/53, pulse 78, temperature 100.4 F (38 C), temperature source Axillary, resp. rate 17, height 5\' 8"  (1.727 m), weight 181 lb 10.5 oz (82.4 kg), SpO2 98.00%. The patient appears to be in no distress.  She is sitting up in a chair.  Nasal oxygen is in place.  Head and neck exam reveals that the pupils are equal and reactive.  The extraocular movements are full.  There is no scleral icterus.  Mouth and pharynx are benign.  No lymphadenopathy.  No carotid bruits.  The jugular venous pressure is normal.  Thyroid is not enlarged or tender.  Chest reveals diminished breath sounds at both bases.  Heart reveals no abnormal lift or heave.  First and second heart sounds are normal.  There is no murmur gallop rub or click.  The pulse is regular with frequent premature atrial contractions  The abdomen reveals diminished to absent bowel sounds.  Extremities reveal no phlebitis or edema.  Pedal pulses are good.  There is no cyanosis or clubbing.  Neurologic exam is normal strength and no lateralizing weakness.  No sensory deficits.  Integument reveals no rash   Assessment/Plan: 1.  Paroxysmal atrial fibrillation 2.  postoperative atelectasis 3.  Hypokalemia 4.  history of essential hypertension 5.  history of hypercholesterolemia  Recommendation: Agree with starting her on metoprolol to help stabilize cardiac rhythm.  Her potassium is being repleted. We will also obtain an echocardiogram.  Will check thyroid function studies Continue medical dose Lovenox.  I would not commit her to long-term anticoagulation at this point since her paroxysmal atrial fibrillation appears to be related simply to her postoperative state and hypokalemia.   Cassell Clement 08/12/2012, 9:45 AM

## 2012-08-12 NOTE — Progress Notes (Signed)
  Echocardiogram 2D Echocardiogram has been performed.  Angela Bond 08/12/2012, 11:40 AM

## 2012-08-12 NOTE — Progress Notes (Signed)
Dr Michaell Cowing notified patient with heart rate of 153.  EKG obtained which shows flutter which was read to Dr Michaell Cowing. History and vitals given also.  Orders received rapid response nurse on floor at this time.

## 2012-08-12 NOTE — Progress Notes (Signed)
Informed husband Annette Stable that patient moving to 1222.  Patient states understanding

## 2012-08-13 ENCOUNTER — Encounter (HOSPITAL_COMMUNITY): Payer: Self-pay | Admitting: General Surgery

## 2012-08-13 LAB — BASIC METABOLIC PANEL
BUN: 16 mg/dL (ref 6–23)
Chloride: 103 mEq/L (ref 96–112)
Creatinine, Ser: 0.91 mg/dL (ref 0.50–1.10)
GFR calc Af Amer: 74 mL/min — ABNORMAL LOW (ref >90)
GFR calc non Af Amer: 64 mL/min — ABNORMAL LOW (ref >90)
Potassium: 3.9 mEq/L (ref 3.5–5.1)

## 2012-08-13 LAB — CLOSTRIDIUM DIFFICILE BY PCR: Toxigenic C. Difficile by PCR: NEGATIVE

## 2012-08-13 LAB — CBC
HCT: 29.1 % — ABNORMAL LOW (ref 36.0–46.0)
MCHC: 32.3 g/dL (ref 30.0–36.0)
MCV: 91.5 fL (ref 78.0–100.0)
Platelets: 150 10*3/uL (ref 150–400)
RDW: 14.7 % (ref 11.5–15.5)

## 2012-08-13 LAB — TSH: TSH: 0.585 u[IU]/mL (ref 0.350–4.500)

## 2012-08-13 MED ORDER — SODIUM CHLORIDE 0.9 % IV SOLN
Freq: Once | INTRAVENOUS | Status: AC
  Administered 2012-08-13: 500 mL via INTRAVENOUS

## 2012-08-13 MED ORDER — SODIUM CHLORIDE 0.9 % IV SOLN
1.0000 g | Freq: Once | INTRAVENOUS | Status: AC
  Administered 2012-08-13: 1 g via INTRAVENOUS
  Filled 2012-08-13: qty 10

## 2012-08-13 MED ORDER — DEXTROSE-NACL 5-0.9 % IV SOLN
INTRAVENOUS | Status: DC
  Administered 2012-08-13: 15:00:00 via INTRAVENOUS

## 2012-08-13 NOTE — Progress Notes (Signed)
CARE MANAGEMENT NOTE 08/13/2012  Patient:  Angela Bond, Angela Bond   Account Number:  [id number]  Date Initiated:  08/13/2012  Documentation initiated by:  Cordarro Spinnato  Subjective/Objective Assessment:   pt three days post op urine outpt less than 40cc in an 8 hours period remains hypotensive     Action/Plan:   iv flds, floow for needs   Anticipated DC Date:  08/16/2012   Anticipated DC Plan:  HOME W HOME HEALTH SERVICES  In-house referral  NA      DC Planning Services  CM consult      Choice offered to / List presented to:        DME agency  [facility]        [facility] agency  [facility]   Status of service:  In process, will continue to follow Medicare Important Message given?  NA - LOS <3 / Initial given by admissions (If response is "NO", the following Medicare IM given date fields will be blank) Date Medicare IM given:   Date Additional Medicare IM given:    Discharge Disposition:    Per UR Regulation:  Reviewed for med. necessity/level of care/duration of stay  If discussed at Long Length of Stay Meetings, dates discussed:    Comments:  [id number]/Richard Ritchey Earlene Plater, RN, BSN, CCM:  CHART REVIEWED AND UPDATED.  Next chart review due on [id number]. NO DISCHARGE NEEDS PRESENT AT THIS TIME. CASE MANAGEMENT [phone]

## 2012-08-13 NOTE — Progress Notes (Signed)
eLink Physician-Brief Progress Note Patient Name: Angela Bond DOB: 1945/11/20 MRN: [id number]  Date of Service  08/13/2012   HPI/Events of Note  Report from nurse of patient having multiple loose stools.  rquest for C Dif test   eICU Interventions  Plan: C Dif test ordered   Intervention Category Minor Interventions: Clinical assessment - ordering diagnostic tests  Angela Bond 08/13/2012, 4:03 AM

## 2012-08-13 NOTE — Progress Notes (Signed)
3 Days Post-Op  Subjective: Pt doing well today.  UOP 40cc/hr last 8hr.  BP still a little low for her.  Echo done yesterday per Dr. Patty Sermons.  Tol liquids well.  Feels like she is breathing better.  Objective: Vital signs in last 24 hours: Temp:  [98.3 F (36.8 C)-100.4 F (38 C)] 98.7 F (37.1 C) (03/31 0044) Pulse Rate:  [64-89] 68 (03/31 0530) Resp:  [12-23] 18 (03/31 0530) BP: (83-118)/(42-77) 93/43 mmHg (03/31 0530) SpO2:  [96 %-100 %] 100 % (03/31 0530) Last BM Date: 08/12/12  Intake/Output from previous day: 03/30 0701 - 03/31 0700 In: 3060 [P.O.:980; I.V.:1180; IV Piggyback:900] Out: 480 [Urine:480] Intake/Output this shift:    General appearance: alert and cooperative GI: soft, non-tender; bowel sounds normal; no masses,  no organomegaly  Lab Results:   Recent Labs  08/12/12 0131 08/13/12 0425  WBC 13.0* 12.4*  HGB 11.4* 9.4*  HCT 35.3* 29.1*  PLT 160 150   BMET  Recent Labs  08/12/12 0131 08/13/12 0425  NA 136 138  K 3.2* 3.9  CL 97 103  CO2 29 26  GLUCOSE 114* 88  BUN 10 16  CREATININE 0.77 0.91  CALCIUM 8.4 7.8*   PT/INR No results found for this basename: LABPROT, INR,  in the last 72 hours ABG No results found for this basename: PHART, PCO2, PO2, HCO3,  in the last 72 hours  Studies/Results: Dg Chest Port 1 View  08/12/2012  **ADDENDUM** CREATED: 08/12/2012 07:07:54  The retrocardiac opacities are most likely related to consolidation and pleural fluid.  The patient recently had a hiatal hernia repair.  A recurrent hiatal hernia cannot be excluded but these findings are nonspecific.  **END ADDENDUM** SIGNED BY: Richarda Overlie, M.D.   08/12/2012  *RADIOLOGY REPORT*  Clinical Data: Rapid heart rate.  Cough and congestion.  PORTABLE CHEST - 1 VIEW  Comparison: Chest x-ray 08/01/2012.  Findings: Lung volumes are low.  Left lower lobe atelectasis and/or consolidation.  Linear opacity in the right mid lung as compatible with subsegmental atelectasis.   Possible small left pleural effusion.  Pulmonary vasculature is within normal limits.  Heart size is normal.  Retrocardiac opacity is again compatible with a large hiatal hernia.  The upper mediastinal contours are unremarkable.  IMPRESSION: 1.  Left lower lobe atelectasis and/or consolidation with small left pleural effusion. 2.  Subsegmental atelectasis in the right mid lung. 3.  Large hiatal hernia.   Original Report Authenticated By: Trudie Reed, M.D.    Dg Kayleen Memos W/water Sol Cm  08/11/2012  *RADIOLOGY REPORT*  Clinical Data:Status post hiatal hernia repair yesterday.  1 day postoperative evaluation to exclude perforation.  ESOPHAGUS/BARIUM SWALLOW/TABLET STUDY  Fluoroscopy Time: 1 minute 11 seconds.  Comparison: Yesterday.  Findings: The patient swallowed 50 ml of Omnipaque-300 without difficulty.  This passed normally through the esophagus into the stomach with a more rounded, dense configuration at the location of the PEG tube balloon in the stomach, also seen yesterday.  No contrast extravasation was demonstrated.  The gastroesophageal junction remains in a normal position.  IMPRESSION: GE junction in a normal location with no perforation.   Original Report Authenticated By: Beckie Salts, M.D.     Assessment/Plan: s/p Procedure(s): LAPAROSCOPIC REPAIR OF HIATAL HERNIA WITH MESH AND EGD WITH PEG TUBE PLACEMENT (N/A) CXR pending today Lytes and Cr OK today. Con't with Liquid diet, full liquids OK Appreciate Cardiology's help Ambulate   LOS: 3 days    Marigene Ehlers., [facility] 08/13/2012

## 2012-08-13 NOTE — Progress Notes (Signed)
Pt with no urine output during the day up to 2300 on 08/12/2012.  Bladder scan showed 179cc. Dr. Abbey Chatters notified.  Orders received.  Foley placed and bolus given. BP 86/44.  MD notified again and orders received.  Continue to monitor.

## 2012-08-14 ENCOUNTER — Encounter (INDEPENDENT_AMBULATORY_CARE_PROVIDER_SITE_OTHER): Payer: Self-pay

## 2012-08-14 LAB — BASIC METABOLIC PANEL
CO2: 25 mEq/L (ref 19–32)
Chloride: 103 mEq/L (ref 96–112)
GFR calc non Af Amer: 88 mL/min — ABNORMAL LOW (ref >90)
Glucose, Bld: 107 mg/dL — ABNORMAL HIGH (ref 70–99)
Potassium: 3.8 mEq/L (ref 3.5–5.1)
Sodium: 137 mEq/L (ref 135–145)

## 2012-08-14 MED ORDER — HYDROCODONE-ACETAMINOPHEN 7.5-325 MG/15ML PO SOLN
10.0000 mL | Freq: Four times a day (QID) | ORAL | Status: DC | PRN
  Administered 2012-08-14 (×2): 10 mL via ORAL
  Filled 2012-08-14 (×2): qty 15

## 2012-08-14 MED ORDER — HYDROCODONE-ACETAMINOPHEN 7.5-325 MG/15ML PO SOLN: 10.0000 mL | Freq: Four times a day (QID) | ORAL | Status: DC | PRN

## 2012-08-14 MED ORDER — HYDROCODONE-ACETAMINOPHEN 7.5-500 MG/15ML PO SOLN: 15.0000 mL | Freq: Four times a day (QID) | ORAL | Status: DC | PRN

## 2012-08-14 NOTE — Progress Notes (Signed)
Assumed care of this patient at 12:30a , agree with previous RN assessment. Rhylen Shaheen M. Joangel Vanosdol, RN  

## 2012-08-14 NOTE — Progress Notes (Signed)
4 Days Post-Op  Subjective: PT doing well no c/o aside from spasm with cold drinks.  Tol PO.  Ambulating and working on IS.  Objective: Vital signs in last 24 hours: Temp:  [98 F (36.7 C)-101.8 F (38.8 C)] 98 F (36.7 C) (04/01 0449) Pulse Rate:  [67-98] 67 (04/01 0449) Resp:  [14-27] 18 (04/01 0449) BP: (85-113)/(45-71) 113/71 mmHg (04/01 0449) SpO2:  [87 %-100 %] 100 % (04/01 0449) Last BM Date: 08/12/12  Intake/Output from previous day: 03/31 0701 - 04/01 0700 In: 2740 [P.O.:1140; I.V.:1600] Out: 1225 [Urine:1225] Intake/Output this shift: Total I/O In: 620 [P.O.:120; I.V.:500] Out: 300 [Urine:300]  General appearance: alert and cooperative GI: soft, non-tender; bowel sounds normal; no masses,  no organomegaly, wounds c/d/i  Lab Results:   Recent Labs  08/12/12 0131 08/13/12 0425  WBC 13.0* 12.4*  HGB 11.4* 9.4*  HCT 35.3* 29.1*  PLT 160 150   BMET  Recent Labs  08/13/12 0425 08/14/12 0447  NA 138 137  K 3.9 3.8  CL 103 103  CO2 26 25  GLUCOSE 88 107*  BUN 16 11  CREATININE 0.91 0.70  CALCIUM 7.8* 8.3*   PT/INR No results found for this basename: LABPROT, INR,  in the last 72 hours ABG No results found for this basename: PHART, PCO2, PO2, HCO3,  in the last 72 hours  Studies/Results: No results found.  Anti-infectives: Anti-infectives   Start     Dose/Rate Route Frequency Ordered Stop   08/10/12 0531  ertapenem (INVANZ) 1 g in sodium chloride 0.9 % 50 mL IVPB     1 g 100 mL/hr over 30 Minutes Intravenous On call to O.R. 08/10/12 0531 08/10/12 0734      Assessment/Plan: s/p Procedure(s): LAPAROSCOPIC REPAIR OF HIATAL HERNIA WITH MESH AND EGD WITH PEG TUBE PLACEMENT (N/A) d/c foley Con't liquid diet Ambulate Home later today if feels well.  LOS: 4 days    Marigene Ehlers., [facility] 08/14/2012

## 2012-08-14 NOTE — Progress Notes (Signed)
Call to Dr Georgana Curio (on call) and informed that pt has not urinated since foley dc'd. Bladder scanned done 134cc of urine noted. Pt states she does not feel the urge to void. Upon looking back pt has had fairly low to normal urine output. She states she will call oncall doctor if she does not void, and started to have bladder distention or spasms. Dr. Georgana Curio said he would bring the pt back into [facility] and have the foley put back if necessary, but he feels she will void after she gets home, and continues to hydrate; Clinical research associate agrees.

## 2012-08-14 NOTE — Discharge Summary (Signed)
Physician Discharge Summary  Patient ID: Angela Bond MRN: [id number] DOB/AGE: 12/08/1945 67 y.o.  Admit date: 08/10/2012 Discharge date: 08/14/2012  Admission Diagnoses: Hiatal hernia Discharge Diagnoses:  Principal Problem:   Sliding hiatal hernia s/p lap PEH repair Active Problems:   Other and unspecified angina pectoris   Atrial fibrillation   Discharged Condition: good  Hospital Course: Pt was admitted s/p HHR on 3/28314 please see op note for full details.  Post op pt was send to floor and started on a liquid diet which she tolerated well.  She went into Afib and was sent down to the SDU.  Cardiology evaluated the pt.  She eventually went back in NSR.  She con't tol her diet and was ambulating and having normal bowel function.  She was deemed stable for DC and DC'd home.  Consults: cardiology  Significant Diagnostic Studies: cardiac graphics: ECG: afib-resovled  Treatments:   Discharge Exam: Blood pressure 113/71, pulse 67, temperature 98 F (36.7 C), temperature source Oral, resp. rate 18, height 5\' 8"  (1.727 m), weight 181 lb 10.5 oz (82.4 kg), SpO2 100.00%. General appearance: alert and cooperative GI: soft, non-tender; bowel sounds normal; no masses,  no organomegaly, wounds c/d/i  Disposition: 01-Home or Self Care   Future Appointments Provider Department Dept Phone   08/30/2012 12:00 PM Axel Filler, MD [facility], [place] [phone]       Medication List    TAKE these medications       ALPRAZolam 0.25 MG tablet  Commonly known as:  XANAX  Take 0.25 mg by mouth at bedtime as needed for sleep or anxiety.     calcium carbonate 600 MG Tabs  Commonly known as:  OS-CAL  Take 600 mg by mouth 2 (two) times daily with a meal.     citalopram 40 MG tablet  Commonly known as:  CELEXA  Take 40 mg by mouth daily before breakfast.     etodolac 500 MG tablet  Commonly known as:  LODINE  Take 500 mg by mouth 2 (two) times daily.     fexofenadine  180 MG tablet  Commonly known as:  ALLEGRA  Take 180 mg by mouth daily.     HYDROcodone-acetaminophen 7.5-500 MG/15ML solution  Commonly known as:  LORTAB  Take 15 mLs by mouth every 6 (six) hours as needed for pain.     hydroxypropyl methylcellulose 2.5 % ophthalmic solution  Commonly known as:  ISOPTO TEARS  Place 1 drop into both eyes as needed (dry eyes).     lisinopril 20 MG tablet  Commonly known as:  PRINIVIL,ZESTRIL  Take 20 mg by mouth daily before breakfast.     lovastatin 40 MG tablet  Commonly known as:  MEVACOR  Take 40 mg by mouth daily before breakfast.     omeprazole 40 MG capsule  Commonly known as:  PRILOSEC  Take 40 mg by mouth daily.     RED YEAST RICE PO  Take 600 mg by mouth daily.     SALINE NASAL MIST NA  Place 1 spray into the nose 2 (two) times daily.     Vitamin D 2000 UNITS tablet  Take 2,000 Units by mouth 2 (two) times daily.           Follow-up Information   Follow up with Lajean Saver, MD. Schedule an appointment as soon as possible for a visit in 2 weeks.   Contact information:   [address] [place] [address] [phone]  Signed: Marigene Ehlers., Tonee Silverstein 08/14/2012, 9:59 AM

## 2012-08-15 ENCOUNTER — Encounter (INDEPENDENT_AMBULATORY_CARE_PROVIDER_SITE_OTHER): Payer: Self-pay | Admitting: General Surgery

## 2012-08-22 ENCOUNTER — Telehealth (INDEPENDENT_AMBULATORY_CARE_PROVIDER_SITE_OTHER): Payer: Self-pay

## 2012-08-22 NOTE — Telephone Encounter (Signed)
rx request for hydrocodone to Dr Gerrit Friends for review.

## 2012-08-22 NOTE — Telephone Encounter (Signed)
Refill request referred to [facility] to review with Dr Derrell Lolling. He did pts surgery

## 2012-08-23 ENCOUNTER — Encounter (INDEPENDENT_AMBULATORY_CARE_PROVIDER_SITE_OTHER): Payer: Self-pay | Admitting: General Surgery

## 2012-08-23 ENCOUNTER — Ambulatory Visit (INDEPENDENT_AMBULATORY_CARE_PROVIDER_SITE_OTHER): Payer: Medicare Other | Admitting: General Surgery

## 2012-08-23 VITALS — BP 116/74 | HR 82 | Resp 18 | Ht <= 58 in | Wt 173.0 lb

## 2012-08-23 DIAGNOSIS — Z8719 Personal history of other diseases of the digestive system: Secondary | ICD-10-CM

## 2012-08-23 NOTE — Progress Notes (Signed)
Patient ID: Angela Bond, female   DOB: 25-Nov-1945, 67 y.o.   MRN: [id number] The patient is a 67 year old female status post hiatal hernia repair with PEG tube placement. The patient was complaining of pain at the PEG tube site. The patient should continue with a liquid diet and has had no dysphagia and is tolerating liquids well.  I removed the stitches at the PEG bolster site. The bolster is at 5.5cm. I discussed with the patient and her husband that this should stay at the 5.5 cm mark.  On exam: Her abdomen is soft nontender nondistended with active bowel sounds. PEG tube is in place. Her wounds are clean dry and intact.  Assessment clinical Have patient continue her liquid diet for 2 weeks. The patient in followup in 2 weeks.

## 2012-08-29 ENCOUNTER — Telehealth (INDEPENDENT_AMBULATORY_CARE_PROVIDER_SITE_OTHER): Payer: Self-pay | Admitting: General Surgery

## 2012-08-29 NOTE — Telephone Encounter (Signed)
Patient calling status post hiatal hernia repair re: refill of her pain medication. Dr Derrell Lolling paged. He states he signed this yesterday. I checked with Lawson Fiscal, his assistant, and she faxed it yesterday with confirmation received. I called [facility] and they state they did not get the RX. Hycet 7.5/325 per 15 ml #120 ML called to [facility]. Patient aware.

## 2012-08-29 NOTE — Telephone Encounter (Signed)
Late entry.Marland KitchenMarland KitchenFaxed refill for hydrocodone/acetamin 7.5-325/25mL solution to [facility] [phone] and confirmation was received...patient called today 4/16 stating that [facility] never received the refill so I went and pulled the Rx refill fax along with the confirmation that I had stapled to it and Angela Bond called [facility] to ask them to go ahead an refill since they didn't seem to get my fax

## 2012-08-30 ENCOUNTER — Encounter (INDEPENDENT_AMBULATORY_CARE_PROVIDER_SITE_OTHER): Payer: Medicare Other | Admitting: General Surgery

## 2012-09-06 ENCOUNTER — Encounter (INDEPENDENT_AMBULATORY_CARE_PROVIDER_SITE_OTHER): Payer: Self-pay | Admitting: General Surgery

## 2012-09-06 ENCOUNTER — Ambulatory Visit (INDEPENDENT_AMBULATORY_CARE_PROVIDER_SITE_OTHER): Payer: Medicare Other | Admitting: General Surgery

## 2012-09-06 VITALS — BP 122/84 | HR 72 | Temp 99.4°F | Resp 18 | Ht <= 58 in | Wt 168.2 lb

## 2012-09-06 DIAGNOSIS — Z8719 Personal history of other diseases of the digestive system: Secondary | ICD-10-CM

## 2012-09-06 NOTE — Progress Notes (Signed)
Patient ID: Angela Bond, female   DOB: 08-15-1945, 67 y.o.   MRN: [id number] The patient is a 67 year old female who underwent laparoscopic hiatal hernia repair on 08/10/2012. The patient is currently one month postop to this point has been on a full liquid diet. The patient states that her previous dysphasia has not recurred. Her PEG tube site pain slowly resolving. The patient does state that approximately one-week ago the patient began having left chest and shoulder pain. She states that this pain became better.  On exam: Her wounds are clean dry and intact PEG tube is in place  Assessment and plan: 67 year old female status post lap hiatal herniarepair 1. At this point we will increase the patient's diet to soft food to include fish, mashed potatoes, scrambled eggs, etc. 2. We'll have the patient follow back up in 3 weeks 3. Patient can continue with activity as tolerated

## 2012-09-21 ENCOUNTER — Encounter (INDEPENDENT_AMBULATORY_CARE_PROVIDER_SITE_OTHER): Payer: Self-pay | Admitting: General Surgery

## 2012-09-21 ENCOUNTER — Ambulatory Visit (INDEPENDENT_AMBULATORY_CARE_PROVIDER_SITE_OTHER): Payer: Medicare Other | Admitting: General Surgery

## 2012-09-21 VITALS — BP 128/78 | HR 72 | Temp 98.0°F | Resp 18 | Ht 62.0 in | Wt 169.0 lb

## 2012-09-21 DIAGNOSIS — K449 Diaphragmatic hernia without obstruction or gangrene: Secondary | ICD-10-CM

## 2012-09-21 NOTE — Progress Notes (Signed)
Patient ID: Angela Bond, female   DOB: 1946/04/09, 67 y.o.   MRN: [id number] The patient is a 67 year old female status post hiatal hernia repair and G tube placement. The patient comes in complaining of a one to two-day history of peri Gtube placement erythema. Patient is not the lesion because underneath the disc which is causing irritation. There's been no purulent drainage.  On exam: There is some surrounding erythema in her G-tube, there's no purulence  Assessment and plan: 67 year old female status post hiatal hernia repair with G-tube placement 1. Recommend continue with gauze around the disc to help with skin irritation. She did irritation and worsen on patient began having fevers and give her a trial of antibiotics 2. Patient will follow back with a one-week

## 2012-09-27 ENCOUNTER — Encounter (INDEPENDENT_AMBULATORY_CARE_PROVIDER_SITE_OTHER): Payer: Self-pay | Admitting: General Surgery

## 2012-09-27 ENCOUNTER — Ambulatory Visit (INDEPENDENT_AMBULATORY_CARE_PROVIDER_SITE_OTHER): Payer: Medicare Other | Admitting: General Surgery

## 2012-09-27 VITALS — BP 124/78 | HR 86 | Resp 18 | Ht <= 58 in | Wt 170.0 lb

## 2012-09-27 DIAGNOSIS — Z8719 Personal history of other diseases of the digestive system: Secondary | ICD-10-CM

## 2012-09-27 NOTE — Progress Notes (Signed)
Patient ID: KAMORA VOSSLER, female   DOB: [birth year], 68 y.o.   MRN: [id number] The patient is a 67 year old female status post lap hernia repair and G-tube placement, approximately 6 weeks ago. The patient has has been doing well with her soft diet.  Her PEG tube has been less irritating at this time, now that she is placing gauze around the bolster.  On exam: PEG tube in place Her wounds are clean dry and intact a well-healed  Assessment and plan: A 67 year old female status post hiatal hernia repair and PEG tube placement.  1.We will increase the patient's diet from a soft diet to a regular diet. I discussed with her increasing her by mouth intake of fluids while skiing. We also discussed the need to chew her food into fine pieces, with small meals.  Okayed her to take her pills one at a time with large amounts of fluid. 2. The patient does state she has some constipation. I discussed with her to increase her fluid intake and I'm okay with a stool softener. 3. We'll have the patient follow back up 1 to 1-1/2 months to reevaluate her diet progress.

## 2012-10-12 ENCOUNTER — Ambulatory Visit (INDEPENDENT_AMBULATORY_CARE_PROVIDER_SITE_OTHER): Payer: Medicare Other | Admitting: General Surgery

## 2012-10-12 ENCOUNTER — Telehealth (INDEPENDENT_AMBULATORY_CARE_PROVIDER_SITE_OTHER): Payer: Self-pay | Admitting: General Surgery

## 2012-10-12 ENCOUNTER — Encounter (INDEPENDENT_AMBULATORY_CARE_PROVIDER_SITE_OTHER): Payer: Self-pay | Admitting: General Surgery

## 2012-10-12 VITALS — BP 138/84 | HR 79 | Temp 98.0°F | Resp 18 | Ht <= 58 in | Wt 168.4 lb

## 2012-10-12 DIAGNOSIS — R109 Unspecified abdominal pain: Secondary | ICD-10-CM | POA: Insufficient documentation

## 2012-10-12 MED ORDER — HYDROCODONE-ACETAMINOPHEN 7.5-325 MG/15ML PO SOLN: 15.0000 mL | Freq: Four times a day (QID) | ORAL | Status: DC | PRN

## 2012-10-12 NOTE — Progress Notes (Signed)
Subjective:     Patient ID: Angela Bond, female   DOB: November 12, 1945, 67 y.o.   MRN: [id number]  HPI The patient is a 67 year old white female who is 2 months status post laparoscopic repair of a large hiatal hernia and placement of a G-tube. The patient has been slowly improving and was recently started on a regular diet. Over the last few days she has had increasing pain in her left upper quadrant. She has had some nausea but has been tolerating food and able to maintain herself. She feels that she did run some low-grade fevers at home. She does have problems with constipation and is taking MiraLAX on a regular basis as well.  Review of Systems     Objective:   Physical Exam On exam her incisions are healing nicely with no sign of infection. Her G-tube seems to be in place with some normal mucousy drainage around the tube but no cellulitis. She does have pretty significant pain in her left upper quadrant. There are normal bowel sounds.    Assessment:     The patient is 2 months status post laparoscopic repair of hiatal hernia and placement of a G-tube. her pain seems to be out of proportion to what we would expect.     Plan:     At this point I would recommend a CT scan of her abdomen and pelvis to look for a source of her pain. We will call her with the results of the study and then proceed accordingly.

## 2012-10-12 NOTE — Patient Instructions (Signed)
Will get CT of abdomen Continue miralax

## 2012-10-12 NOTE — Telephone Encounter (Signed)
Pt of Dr. Derrell Lolling called to ask to be seen.  She has developed new pain near her drain, also a low-grade fever.  She takes Miralax and stool softener daily and is having regular, normal bowel movements.  She does not think this is gas, but he new pain is worrisome.  Made appt with Urgent office today.

## 2012-10-12 NOTE — Addendum Note (Signed)
Addended by: Caleen Essex III on: 10/12/2012 03:24 PM   Modules accepted: Orders

## 2012-10-15 ENCOUNTER — Ambulatory Visit
Admission: RE | Admit: 2012-10-15 | Discharge: 2012-10-15 | Disposition: A | Discharge status: Home / Self Care | Payer: Medicare Other | Source: Ambulatory Visit | Attending: General Surgery | Admitting: General Surgery

## 2012-10-15 DIAGNOSIS — R109 Unspecified abdominal pain: Secondary | ICD-10-CM

## 2012-10-15 MED ORDER — IOHEXOL 300 MG/ML  SOLN
100.0000 mL | Freq: Once | INTRAMUSCULAR | Status: AC | PRN
  Administered 2012-10-15: 100 mL via INTRAVENOUS

## 2012-10-16 ENCOUNTER — Telehealth (INDEPENDENT_AMBULATORY_CARE_PROVIDER_SITE_OTHER): Payer: Self-pay | Admitting: General Surgery

## 2012-10-16 NOTE — Telephone Encounter (Signed)
Patient returned my call and was made aware...she was also reminded of her appt this Friday 6/6 and told to be here at 4:20.Marland KitchenMarland Kitchenpatient seemed like she understood

## 2012-10-16 NOTE — Telephone Encounter (Signed)
LMOM at 2:30 for patient to call me back 10/16/12          Axel Filler, MD ','<More Detail >>       Axel Filler, MD  Angela Bond     MRN: [id number] DOB: 1946/04/14     Pt Home: [id number]     Sent: Tue October 16, 2012 10:40 AM     To: June Leap                          Message    Can you call [name] and tell her that everything looks OK on her CT scan. On her next visit with me we can probably loosen the PEG tube bolster.      Thanks   AR

## 2012-10-17 ENCOUNTER — Telehealth (INDEPENDENT_AMBULATORY_CARE_PROVIDER_SITE_OTHER): Payer: Self-pay | Admitting: General Surgery

## 2012-10-17 NOTE — Telephone Encounter (Signed)
callled patient to see if she would like to come in sooner for her appt on 6/6 and she said that would be fine..she will be here at 10

## 2012-10-19 ENCOUNTER — Ambulatory Visit (INDEPENDENT_AMBULATORY_CARE_PROVIDER_SITE_OTHER): Payer: Medicare Other | Admitting: General Surgery

## 2012-10-19 ENCOUNTER — Encounter (INDEPENDENT_AMBULATORY_CARE_PROVIDER_SITE_OTHER): Payer: Self-pay | Admitting: General Surgery

## 2012-10-19 VITALS — BP 104/66 | HR 81 | Temp 97.7°F | Ht <= 58 in | Wt 169.6 lb

## 2012-10-19 DIAGNOSIS — Z8719 Personal history of other diseases of the digestive system: Secondary | ICD-10-CM

## 2012-10-19 NOTE — Progress Notes (Signed)
Patient ID: Angela Bond, female   DOB: July 12, 1945, 67 y.o.   MRN: [id number] The patient is a 67 year old female who was recently seen last week secondary to abdominal pain. Patient underwent a CT scan to evaluate her abdominal pain for possible abscess. There is no abscess on CT scan or any other significant findings aside from the mushroom of the PEG tube being pulled tight against the abdominal wall  On exam: Her tube is in place there is no erythema or drainage coming from the tube area. The bolster was 5.5 cm.  Assessment and plan: We pulled the bolster back to approximately 6.5 cm. The patient can continue with gauze around the tubing to help drainage around the tube. We'll have the patient come back in 2 weeks to evaluate her abdominal pain. We discussed the fact that currently for PEG daily and as long as possible secondary to her large hiatal hernia.  We may have to readjust her PEG tube on this visit or completely remove it at that time based on her symptoms.

## 2012-10-25 ENCOUNTER — Telehealth (INDEPENDENT_AMBULATORY_CARE_PROVIDER_SITE_OTHER): Payer: Self-pay

## 2012-10-25 NOTE — Telephone Encounter (Signed)
Per Dr. Michaell Cowing' instructions advised the patient to perform BID dressing changes and use ample gauze.  Apply Desitin cream around the PEG tube where it meets the skin.  Give the body time for the skin to fill in around the tube.  Make sure she sees Dr. Derrell Lolling for f/u.  Pt understands instructions and agrees with the plan.

## 2012-10-25 NOTE — Telephone Encounter (Signed)
Pt calling today stating her tube is very loose and there is more drainage around it.  She is changing her dressing frequently.  Dr. Derrell Lolling loosened the tube at her last appt because it was causing her discomfort.  She has no fever, chills or pain.  She also said the skin around the tube is red, but not sore to the touch.  Please advise.

## 2012-11-08 ENCOUNTER — Ambulatory Visit (INDEPENDENT_AMBULATORY_CARE_PROVIDER_SITE_OTHER): Payer: Medicare Other | Admitting: General Surgery

## 2012-11-08 ENCOUNTER — Encounter (INDEPENDENT_AMBULATORY_CARE_PROVIDER_SITE_OTHER): Payer: Self-pay | Admitting: General Surgery

## 2012-11-08 VITALS — BP 134/78 | HR 75 | Temp 99.0°F | Resp 18 | Ht 60.0 in | Wt 171.0 lb

## 2012-11-08 DIAGNOSIS — Z8719 Personal history of other diseases of the digestive system: Secondary | ICD-10-CM

## 2012-11-08 NOTE — Progress Notes (Signed)
Patient ID: Angela Bond, female   DOB: 1945-07-22, 67 y.o.   MRN: [id number] The patient is a 67 year old female who underwent a laparoscopic hiatal hernia repair with  PEG tube placement on 08/10/2012. Patient has been doing well with her meals. She has not had any complications with her regular diet.  The PEG tube has been causing some soreness in her favor her daily activities. There is also some leakage from around the tube.   On exam: Abdomen is soft nontender nondistended with active bowel sounds, wounds are clean dry and intact, PEG tube in place   Assessment and plan: A 67 year old female status post lap hernia repair with Nissen fundoplication and PEG tube placement  1.  I removed herPEG tube today secondary to her ongoing complaints of soreness and leakage. The mother that the scar tissue swelling the stomach and placed in the hiatal hernia repair is solid. 2. The patient to return when necessary. I discussed with her any signs or symptoms that she is to return for evaluation.

## 2012-11-14 ENCOUNTER — Telehealth (INDEPENDENT_AMBULATORY_CARE_PROVIDER_SITE_OTHER): Payer: Self-pay | Admitting: General Surgery

## 2012-11-14 NOTE — Telephone Encounter (Signed)
Pt called in this morning concerned with area from which her PEG tube was D/C 11/08/12 by Ramirez,MD Pt states the opening hasn't drained in two days but is still open. Denies any N/V,fever,pain,swelling at the site. Ramirez,MD advised to let pt know the area would eventually close. Pt understood and was agreeable.

## 2013-05-13 ENCOUNTER — Ambulatory Visit
Admission: RE | Admit: 2013-05-13 | Discharge: 2013-05-13 | Disposition: A | Discharge status: Home / Self Care | Payer: Medicare Other | Source: Ambulatory Visit | Attending: Family Medicine | Admitting: Family Medicine

## 2013-05-13 ENCOUNTER — Other Ambulatory Visit: Payer: Self-pay | Admitting: Family Medicine

## 2013-05-13 DIAGNOSIS — R52 Pain, unspecified: Secondary | ICD-10-CM

## 2013-05-13 DIAGNOSIS — W19XXXA Unspecified fall, initial encounter: Secondary | ICD-10-CM

## 2013-06-25 ENCOUNTER — Encounter: Payer: Self-pay | Admitting: *Deleted

## 2013-07-14 ENCOUNTER — Encounter (HOSPITAL_COMMUNITY): Payer: Self-pay | Admitting: Emergency Medicine

## 2013-07-14 ENCOUNTER — Emergency Department (HOSPITAL_COMMUNITY)
Admission: EM | Admit: 2013-07-14 | Discharge: 2013-07-14 | Disposition: A | Discharge status: Home / Self Care | Payer: Medicare Other | Source: Home / Self Care

## 2013-07-14 DIAGNOSIS — J329 Chronic sinusitis, unspecified: Secondary | ICD-10-CM

## 2013-07-14 DIAGNOSIS — H8309 Labyrinthitis, unspecified ear: Secondary | ICD-10-CM

## 2013-07-14 DIAGNOSIS — R110 Nausea: Secondary | ICD-10-CM

## 2013-07-14 DIAGNOSIS — J069 Acute upper respiratory infection, unspecified: Secondary | ICD-10-CM

## 2013-07-14 MED ORDER — ONDANSETRON 4 MG PO TBDP
ORAL_TABLET | ORAL | Status: AC
  Filled 2013-07-14: qty 1

## 2013-07-14 MED ORDER — MECLIZINE HCL 25 MG PO TABS: ORAL_TABLET | ORAL | Status: DC

## 2013-07-14 MED ORDER — ONDANSETRON 4 MG PO TBDP
4.0000 mg | ORAL_TABLET | Freq: Once | ORAL | Status: AC
  Administered 2013-07-14: 4 mg via ORAL

## 2013-07-14 NOTE — ED Provider Notes (Signed)
CSN: [id number]     Arrival date & time 07/14/13  1231 History   First MD Initiated Contact with Patient 07/14/13 1310     Chief Complaint  Patient presents with  . Sinusitis   (Consider location/radiation/quality/duration/timing/severity/associated sxs/prior Treatment) HPI Comments: 68 year old female presents with pressure discomfort in the maxillary and frontal sinuses. This was preceded by sneezing, runny nose and PND cough began approximately 8 days ago. She took an antihistamine that helped with the runny nose and sneezing. After taking antihistamines she developed stuffiness along with the maxillary and frontal sinus pressure. Yesterday she developed dizziness and nausea. The dizziness is exacerbated by head movement and body position changes. Whenever she becomes dizzy she develops nausea. Chest pain, shortness of breath, vomiting, diarrhea or abdominal pain.   Past Medical History  Diagnosis Date  . Arthritis   . Hypertension   . GERD (gastroesophageal reflux disease)   . Hypercholesteremia   . Heart murmur   . Depression   . H/O hiatal hernia   . Shortness of breath     "stomach is in lungs"  . Headache(784.0)     sinus   Past Surgical History  Procedure Laterality Date  . Total knee arthroplasty    . Eye surgery  2013    tube placed Left  . Esophageal manometry N/A 07/02/2012    Procedure: ESOPHAGEAL MANOMETRY (EM);  Surgeon: Lear Ng, MD;  Location: [facility];  Service: Endoscopy;  Laterality: N/A;  schooler to read  . Joint replacement Bilateral 2011,2012    knees  . Breast surgery      r breast biopsy  . Tonsillectomy  as child  . Hiatal hernia repair N/A 08/10/2012    Procedure: LAPAROSCOPIC REPAIR OF HIATAL HERNIA WITH MESH AND EGD WITH PEG TUBE PLACEMENT;  Surgeon: Ralene Ok, MD;  Location: [facility];  Service: General;  Laterality: N/A;   Family History  Problem Relation Age of Onset  . Heart disease Mother   . Heart disease Father   .  Cancer Father     skin  . Cancer Sister     breast   History  Substance Use Topics  . Smoking status: Never Smoker   . Smokeless tobacco: Never Used  . Alcohol Use: No   OB History   Grav Para Term Preterm Abortions TAB SAB Ect Mult Living                 Review of Systems  Constitutional: Positive for activity change. Negative for fever.  HENT: Positive for congestion, rhinorrhea and sinus pressure. Negative for ear pain, sore throat and trouble swallowing.   Respiratory: Negative for cough, chest tightness, shortness of breath and wheezing.   Cardiovascular: Negative.   Gastrointestinal: Positive for nausea. Negative for vomiting and abdominal pain.  Genitourinary: Negative.   Skin: Negative.   Neurological: Positive for dizziness. Negative for tremors, seizures, syncope, facial asymmetry, speech difficulty and numbness.    Allergies  Aleve; Aspirin; Ciprofloxacin; Doxycycline; Evista; Fosamax; Pravachol; Sulfa antibiotics; Welchol; and Zocor  Home Medications   Current Outpatient Rx  Name  Route  Sig  Dispense  Refill  . ALPRAZolam (XANAX) 0.25 MG tablet   Oral   Take 0.25 mg by mouth at bedtime as needed for sleep or anxiety.          . calcium carbonate (OS-CAL) 600 MG TABS   Oral   Take 600 mg by mouth 2 (two) times daily with a meal.         .  Cholecalciferol (VITAMIN D) 2000 UNITS tablet   Oral   Take 2,000 Units by mouth 2 (two) times daily.          Marland Kitchen etodolac (LODINE) 500 MG tablet   Oral   Take 500 mg by mouth 2 (two) times daily.         . fexofenadine (ALLEGRA) 180 MG tablet   Oral   Take 180 mg by mouth daily.         Marland Kitchen HYDROcodone-acetaminophen (HYCET) 7.5-325 mg/15 ml solution   Oral   Take 15 mLs by mouth 4 (four) times daily as needed for pain.   120 mL   0   . hydroxypropyl methylcellulose (ISOPTO TEARS) 2.5 % ophthalmic solution   Both Eyes   Place 1 drop into both eyes as needed (dry eyes).         Marland Kitchen lisinopril  (PRINIVIL,ZESTRIL) 20 MG tablet   Oral   Take 20 mg by mouth daily before breakfast.          . lovastatin (MEVACOR) 40 MG tablet   Oral   Take 40 mg by mouth daily before breakfast.          . meclizine (ANTIVERT) 25 MG tablet      Take 1/2 to 1 tablet q 8h prn dizziness and nausea.   21 tablet   0   . omeprazole (PRILOSEC) 40 MG capsule   Oral   Take 40 mg by mouth daily.         Marland Kitchen SALINE NASAL MIST NA   Nasal   Place 1 spray into the nose 2 (two) times daily.           BP 121/68  Pulse 72  Temp(Src) 98.9 F (37.2 C) (Oral)  Resp 16  SpO2 100% Physical Exam  Nursing note and vitals reviewed. Constitutional: She is oriented to person, place, and time. She appears well-developed and well-nourished. No distress.  HENT:  Mouth/Throat: No oropharyngeal exudate.  Bilateral TMs are normal Oropharynx is clear other than a narrow streak of erythema on each side leaving the central area appearing normal.  Eyes: Conjunctivae and EOM are normal. Pupils are equal, round, and reactive to light.  No nystagmus  Neck: Normal range of motion. Neck supple.  Cardiovascular: Normal rate, regular rhythm, normal heart sounds and intact distal pulses.   Pulmonary/Chest: Effort normal and breath sounds normal. No respiratory distress. She has no wheezes. She has no rales.  Abdominal: Soft. There is no tenderness.  Musculoskeletal: She exhibits no edema.  Lymphadenopathy:    She has no cervical adenopathy.  Neurological: She is alert and oriented to person, place, and time. She has normal strength. No cranial nerve deficit or sensory deficit.  Having the patient lean forward with a spelled out to the floor produces nausea. She also has some increase in pain of the frontal sinuses.  Skin: Skin is warm and dry.  Psychiatric: She has a normal mood and affect.    ED Course  Procedures (including critical care time) Labs Review Labs Reviewed - No data to display Imaging Review No  results found.   MDM   1. URI (upper respiratory infection)   2. Sinusitis   3. Labyrinthitis   4. Nausea      The patient apparently developed URI symptoms approximately one week ago followed by sinus congestion and pressure. This was followed by labyrinthitis associated with nausea and exacerbated by movement yesterday. Vital signs are stable, she is  alert, lucid and oriented with normal speech and content meclizne dizziness and nause Sudafed PE with crackers for sinus pressure Rest, cloear liquids.  F/U with PCP this week.     Janne Napoleon, NP 07/14/13 1406

## 2013-07-14 NOTE — Discharge Instructions (Signed)
Dizziness Dizziness is a common problem. It is a feeling of unsteadiness or lightheadedness. You may feel like you are about to faint. Dizziness can lead to injury if you stumble or fall. A person of any age group can suffer from dizziness, but dizziness is more common in older adults. CAUSES  Dizziness can be caused by many different things, including:  Middle ear problems.  Standing for too long.  Infections.  An allergic reaction.  Aging.  An emotional response to something, such as the sight of blood.  Side effects of medicines.  Fatigue.  Problems with circulation or blood pressure.  Excess use of alcohol, medicines, or illegal drug use.  Breathing too fast (hyperventilation).  An arrhythmia or problems with your heart rhythm.  Low red blood cell count (anemia).  Pregnancy.  Vomiting, diarrhea, fever, or other illnesses that cause dehydration.  Diseases or conditions such as Parkinson's disease, high blood pressure (hypertension), diabetes, and thyroid problems.  Exposure to extreme heat. DIAGNOSIS  To find the cause of your dizziness, your caregiver may do a physical exam, lab tests, radiologic imaging scans, or an electrocardiography test (ECG).  TREATMENT  Treatment of dizziness depends on the cause of your symptoms and can vary greatly. HOME CARE INSTRUCTIONS   Drink enough fluids to keep your urine clear or pale yellow. This is especially important in very hot weather. In the elderly, it is also important in cold weather.  If your dizziness is caused by medicines, take them exactly as directed. When taking blood pressure medicines, it is especially important to get up slowly.  Rise slowly from chairs and steady yourself until you feel okay.  In the morning, first sit up on the side of the bed. When this seems okay, stand slowly while holding onto something until you know your balance is fine.  If you need to stand in one place for a long time, be sure to  move your legs often. Tighten and relax the muscles in your legs while standing.  If dizziness continues to be a problem, have someone stay with you for a day or two. Do this until you feel you are well enough to stay alone. Have the person call your caregiver if he or she notices changes in you that are concerning.  Do not drive or use heavy machinery if you feel dizzy.  Do not drink alcohol. SEEK IMMEDIATE MEDICAL CARE IF:   Your dizziness or lightheadedness gets worse.  You feel nauseous or vomit.  You develop problems with talking, walking, weakness, or using your arms, hands, or legs.  You are not thinking clearly or you have difficulty forming sentences. It may take a friend or family member to determine if your thinking is normal.  You develop chest pain, abdominal pain, shortness of breath, or sweating.  Your vision changes.  You notice any bleeding.  You have side effects from medicine that seems to be getting worse rather than better. MAKE SURE YOU:   Understand these instructions.  Will watch your condition.  Will get help right away if you are not doing well or get worse. Document Released: 10/26/2000 Document Revised: 07/25/2011 Document Reviewed: 11/19/2010 [facility] Patient Information 2014 [place], [place].  Nausea, Adult Nausea is the feeling that you have an upset stomach or have to vomit. Nausea by itself is not likely a serious concern, but it may be an early sign of more serious medical problems. As nausea gets worse, it can lead to vomiting. If vomiting develops,  there is the risk of dehydration.  CAUSES   Viral infections.  Food poisoning.  Medicines.  Pregnancy.  Motion sickness.  Migraine headaches.  Emotional distress.  Severe pain from any source.  Alcohol intoxication. HOME CARE INSTRUCTIONS  Get plenty of rest.  Ask your caregiver about specific rehydration instructions.  Eat small amounts of food and sip liquids more  often.  Take all medicines as told by your caregiver. SEEK MEDICAL CARE IF:  You have not improved after 2 days, or you get worse.  You have a headache. SEEK IMMEDIATE MEDICAL CARE IF:   You have a fever.  You faint.  You keep vomiting or have blood in your vomit.  You are extremely weak or dehydrated.  You have dark or bloody stools.  You have severe chest or abdominal pain. MAKE SURE YOU:  Understand these instructions.  Will watch your condition.  Will get help right away if you are not doing well or get worse. Document Released: 06/09/2004 Document Revised: 01/25/2012 Document Reviewed: 01/12/2011 [facility] Patient Information 2014 [place], [place].  Labyrinthitis (Inner Ear Inflammation) Your exam shows you have an inner ear disturbance or labyrinthitis. The cause of this condition is not known. But it may be due to a virus infection. The symptoms of labyrinthitis include vertigo or dizziness made worse by motion, nausea and vomiting. The onset of labyrinthitis may be very sudden. It usually lasts for a few days and then clears up over 1-2 weeks. The treatment of an inner ear disturbance includes bed rest and medications to reduce dizziness, nausea, and vomiting. You should stay away from alcohol, tranquilizers, caffeine, nicotine, or any medicine your doctor thinks may make your symptoms worse. Further testing may be needed to evaluate your hearing and balance system. Please see your doctor or go to the emergency room right away if you have:  Increasing vertigo, earache, loss of hearing, or ear drainage.  Headache, blurred vision, trouble walking, fainting, or fever.  Persistent vomiting, dehydration, or extreme weakness. Document Released: 05/02/2005 Document Revised: 07/25/2011 Document Reviewed: 10/18/2006 [facility] Patient Information 2014 [place].  Sinusitis Sudafed PE 10 mg every 6-8 hours as needed for sinus pressure. Take with crackers. Sinusitis is  redness, soreness, and swelling (inflammation) of the paranasal sinuses. Paranasal sinuses are air pockets within the bones of your face (beneath the eyes, the middle of the forehead, or above the eyes). In healthy paranasal sinuses, mucus is able to drain out, and air is able to circulate through them by way of your nose. However, when your paranasal sinuses are inflamed, mucus and air can become trapped. This can allow bacteria and other germs to grow and cause infection. Sinusitis can develop quickly and last only a short time (acute) or continue over a long period (chronic). Sinusitis that lasts for more than 12 weeks is considered chronic.  CAUSES  Causes of sinusitis include:  Allergies.  Structural abnormalities, such as displacement of the cartilage that separates your nostrils (deviated septum), which can decrease the air flow through your nose and sinuses and affect sinus drainage.  Functional abnormalities, such as when the small hairs (cilia) that line your sinuses and help remove mucus do not work properly or are not present. SYMPTOMS  Symptoms of acute and chronic sinusitis are the same. The primary symptoms are pain and pressure around the affected sinuses. Other symptoms include:  Upper toothache.  Earache.  Headache.  Bad breath.  Decreased sense of smell and taste.  A cough, which worsens  when you are lying flat.  Fatigue.  Fever.  Thick drainage from your nose, which often is green and may contain pus (purulent).  Swelling and warmth over the affected sinuses. DIAGNOSIS  Your caregiver will perform a physical exam. During the exam, your caregiver may:  Look in your nose for signs of abnormal growths in your nostrils (nasal polyps).  Tap over the affected sinus to check for signs of infection.  View the inside of your sinuses (endoscopy) with a special imaging device with a light attached (endoscope), which is inserted into your sinuses. If your caregiver  suspects that you have chronic sinusitis, one or more of the following tests may be recommended:  Allergy tests.  Nasal culture A sample of mucus is taken from your nose and sent to a lab and screened for bacteria.  Nasal cytology A sample of mucus is taken from your nose and examined by your caregiver to determine if your sinusitis is related to an allergy. TREATMENT  Most cases of acute sinusitis are related to a viral infection and will resolve on their own within 10 days. Sometimes medicines are prescribed to help relieve symptoms (pain medicine, decongestants, nasal steroid sprays, or saline sprays).  However, for sinusitis related to a bacterial infection, your caregiver will prescribe antibiotic medicines. These are medicines that will help kill the bacteria causing the infection.  Rarely, sinusitis is caused by a fungal infection. In theses cases, your caregiver will prescribe antifungal medicine. For some cases of chronic sinusitis, surgery is needed. Generally, these are cases in which sinusitis recurs more than 3 times per year, despite other treatments. HOME CARE INSTRUCTIONS   Drink plenty of water. Water helps thin the mucus so your sinuses can drain more easily.  Use a humidifier.  Inhale steam 3 to 4 times a day (for example, sit in the bathroom with the shower running).  Apply a warm, moist washcloth to your face 3 to 4 times a day, or as directed by your caregiver.  Use saline nasal sprays to help moisten and clean your sinuses.  Take over-the-counter or prescription medicines for pain, discomfort, or fever only as directed by your caregiver. SEEK IMMEDIATE MEDICAL CARE IF:  You have increasing pain or severe headaches.  You have nausea, vomiting, or drowsiness.  You have swelling around your face.  You have vision problems.  You have a stiff neck.  You have difficulty breathing. MAKE SURE YOU:   Understand these instructions.  Will watch your  condition.  Will get help right away if you are not doing well or get worse. Document Released: 05/02/2005 Document Revised: 07/25/2011 Document Reviewed: 05/17/2011 [facility] Patient Information 2014 [place], [place].

## 2013-07-14 NOTE — ED Notes (Signed)
Pt c/o sinus pressure and dizziness x 1 week. Pt reports she has felt disoriented at times. Pt previously had sx of nasal drainage and sneezing which subsided after an OTC antihistamine. Pt is alert and oriented and in no acute distress.

## 2013-07-15 NOTE — ED Provider Notes (Signed)
Medical screening examination/treatment/procedure(s) were performed by a resident physician or non-physician practitioner and as the supervising physician I was immediately available for consultation/collaboration.  Lynne Leader, MD    Gregor Hams, MD 07/15/13 [phone]

## 2013-08-13 ENCOUNTER — Ambulatory Visit (INDEPENDENT_AMBULATORY_CARE_PROVIDER_SITE_OTHER): Payer: Medicare Other

## 2013-08-13 ENCOUNTER — Ambulatory Visit (INDEPENDENT_AMBULATORY_CARE_PROVIDER_SITE_OTHER): Payer: Medicare Other | Admitting: Podiatry

## 2013-08-13 ENCOUNTER — Encounter: Payer: Self-pay | Admitting: Podiatry

## 2013-08-13 VITALS — BP 127/71 | HR 65 | Resp 17 | Ht 60.0 in | Wt 185.0 lb

## 2013-08-13 DIAGNOSIS — M79609 Pain in unspecified limb: Secondary | ICD-10-CM

## 2013-08-13 DIAGNOSIS — M19079 Primary osteoarthritis, unspecified ankle and foot: Secondary | ICD-10-CM

## 2013-08-13 DIAGNOSIS — M722 Plantar fascial fibromatosis: Secondary | ICD-10-CM

## 2013-08-13 MED ORDER — METHYLPREDNISOLONE (PAK) 4 MG PO TABS: ORAL_TABLET | ORAL | Status: DC

## 2013-08-13 MED ORDER — MELOXICAM 15 MG PO TABS: 15.0000 mg | ORAL_TABLET | Freq: Every day | ORAL | Status: DC

## 2013-08-13 NOTE — Progress Notes (Signed)
   Subjective:    Patient ID: Angela Bond, female    DOB: 11-16-1945, 68 y.o.   MRN: [id number]  HPI N foot pain        L B/L dorsal midfoot, Left > right        D about 1 year        O worsening        C throbbing        A worse at night and in the morning, but hurts all day        T OTC orthotics, [employer] made a foot brace for the left foot prescribed by Dr. Percell Miller    Review of Systems  Constitutional: Negative.   HENT: Positive for sinus pressure.   Eyes: Positive for itching.  Respiratory: Negative.   Cardiovascular:       Calf pain when walking  Gastrointestinal: Negative.   Endocrine: Negative.   Genitourinary: Negative.   Musculoskeletal: Positive for arthralgias and gait problem.  Skin: Negative.   Allergic/Immunologic: Negative.   Neurological: Negative.   Hematological: Bruises/bleeds easily.  Psychiatric/Behavioral: Negative.        Objective:   Physical Exam: I have reviewed her past medical history medications allergies surgeries social history and review of systems. Pulses are strongly palpable bilateral. Neurologic sensorium is intact bilateral. Deep tendon reflexes are intact bilateral. Muscle strength is 5 over 5 dorsiflexors plantar flexors inverters everters all intrinsic musculature is intact. Orthopedic evaluation demonstrates palpable osseous masses to the dorsal aspect of the bilateral foot warm to palpation. She has pain on palpation of these areas as well as frontal plane range of motion. She has pain on palpation medial calcaneal tubercles left greater than right. 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 And plantar fasciitis bilateral. She also has pain on in range of motion of the first metatarsophalangeal joint left foot.        Assessment & Plan:  Assessment: Plantar fasciitis bilateral. 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  Plan:  Discussed etiology pathology conservative versus surgical therapies. At this point we injected the bilateral heels today. Her in her on a Medrol Dosepak to be followed by [facility]. She will discontinue her [employer] gear that time. She was dispensed a night splint for her left foot and I will followup with her in one month

## 2013-09-10 ENCOUNTER — Ambulatory Visit: Payer: Medicare Other | Admitting: Podiatry

## 2013-09-13 DIAGNOSIS — B029 Zoster without complications: Secondary | ICD-10-CM

## 2013-09-13 HISTORY — DX: Zoster without complications: B02.9

## 2013-09-17 ENCOUNTER — Ambulatory Visit (INDEPENDENT_AMBULATORY_CARE_PROVIDER_SITE_OTHER): Payer: Medicare Other | Admitting: Podiatry

## 2013-09-17 ENCOUNTER — Encounter: Payer: Self-pay | Admitting: Podiatry

## 2013-09-17 VITALS — BP 103/60 | HR 64 | Resp 18

## 2013-09-17 DIAGNOSIS — M722 Plantar fascial fibromatosis: Secondary | ICD-10-CM

## 2013-09-17 NOTE — Progress Notes (Signed)
She presents today for followup of her plantar fasciitis she states that they're approximately 85% better she still burning to the bilateral foot.  Objective: Pulses are strongly palpable bilateral. She has pain on palpation medial continued tubercles bilaterally. Much decrease in erythema and edema from previous evaluation.  Assessment: Well-healing plantar fasciitis still develops some paresthesias occasionally bilateral.  Plan: Reinjected bilateral heels today she will continue all conservative therapies and I will followup with her in one month.

## 2013-09-20 ENCOUNTER — Observation Stay (HOSPITAL_COMMUNITY): Payer: Medicare Other

## 2013-09-20 ENCOUNTER — Inpatient Hospital Stay (HOSPITAL_COMMUNITY)
Admission: EM | Admit: 2013-09-20 | Discharge: 2013-09-28 | DRG: 313 | Disposition: A | Discharge status: Home / Self Care | Payer: Medicare Other | Attending: Internal Medicine | Admitting: Internal Medicine

## 2013-09-20 ENCOUNTER — Emergency Department (HOSPITAL_COMMUNITY): Payer: Medicare Other

## 2013-09-20 ENCOUNTER — Encounter (HOSPITAL_COMMUNITY): Payer: Self-pay | Admitting: Emergency Medicine

## 2013-09-20 DIAGNOSIS — I480 Paroxysmal atrial fibrillation: Secondary | ICD-10-CM | POA: Diagnosis present

## 2013-09-20 DIAGNOSIS — F329 Major depressive disorder, single episode, unspecified: Secondary | ICD-10-CM | POA: Diagnosis present

## 2013-09-20 DIAGNOSIS — B029 Zoster without complications: Secondary | ICD-10-CM | POA: Diagnosis present

## 2013-09-20 DIAGNOSIS — K5900 Constipation, unspecified: Secondary | ICD-10-CM | POA: Diagnosis present

## 2013-09-20 DIAGNOSIS — E875 Hyperkalemia: Secondary | ICD-10-CM

## 2013-09-20 DIAGNOSIS — F411 Generalized anxiety disorder: Secondary | ICD-10-CM | POA: Diagnosis present

## 2013-09-20 DIAGNOSIS — R109 Unspecified abdominal pain: Secondary | ICD-10-CM

## 2013-09-20 DIAGNOSIS — M545 Low back pain, unspecified: Secondary | ICD-10-CM | POA: Diagnosis present

## 2013-09-20 DIAGNOSIS — F3289 Other specified depressive episodes: Secondary | ICD-10-CM | POA: Diagnosis present

## 2013-09-20 DIAGNOSIS — N289 Disorder of kidney and ureter, unspecified: Secondary | ICD-10-CM

## 2013-09-20 DIAGNOSIS — I952 Hypotension due to drugs: Secondary | ICD-10-CM

## 2013-09-20 DIAGNOSIS — M129 Arthropathy, unspecified: Secondary | ICD-10-CM

## 2013-09-20 DIAGNOSIS — E871 Hypo-osmolality and hyponatremia: Secondary | ICD-10-CM | POA: Diagnosis present

## 2013-09-20 DIAGNOSIS — D649 Anemia, unspecified: Secondary | ICD-10-CM | POA: Diagnosis present

## 2013-09-20 DIAGNOSIS — Z79899 Other long term (current) drug therapy: Secondary | ICD-10-CM

## 2013-09-20 DIAGNOSIS — K219 Gastro-esophageal reflux disease without esophagitis: Secondary | ICD-10-CM | POA: Diagnosis present

## 2013-09-20 DIAGNOSIS — N179 Acute kidney failure, unspecified: Secondary | ICD-10-CM | POA: Diagnosis present

## 2013-09-20 DIAGNOSIS — E785 Hyperlipidemia, unspecified: Secondary | ICD-10-CM | POA: Diagnosis present

## 2013-09-20 DIAGNOSIS — Z2989 Encounter for other specified prophylactic measures: Secondary | ICD-10-CM

## 2013-09-20 DIAGNOSIS — Z418 Encounter for other procedures for purposes other than remedying health state: Secondary | ICD-10-CM

## 2013-09-20 DIAGNOSIS — F32A Depression, unspecified: Secondary | ICD-10-CM

## 2013-09-20 DIAGNOSIS — I9589 Other hypotension: Secondary | ICD-10-CM | POA: Diagnosis present

## 2013-09-20 DIAGNOSIS — T463X5A Adverse effect of coronary vasodilators, initial encounter: Secondary | ICD-10-CM | POA: Diagnosis present

## 2013-09-20 DIAGNOSIS — Z96659 Presence of unspecified artificial knee joint: Secondary | ICD-10-CM

## 2013-09-20 DIAGNOSIS — I4891 Unspecified atrial fibrillation: Secondary | ICD-10-CM

## 2013-09-20 DIAGNOSIS — E7800 Pure hypercholesterolemia, unspecified: Secondary | ICD-10-CM | POA: Diagnosis present

## 2013-09-20 DIAGNOSIS — M1990 Unspecified osteoarthritis, unspecified site: Secondary | ICD-10-CM

## 2013-09-20 DIAGNOSIS — R0789 Other chest pain: Principal | ICD-10-CM | POA: Diagnosis present

## 2013-09-20 DIAGNOSIS — I10 Essential (primary) hypertension: Secondary | ICD-10-CM | POA: Diagnosis present

## 2013-09-20 HISTORY — DX: Major depressive disorder, single episode, unspecified: F32.9

## 2013-09-20 HISTORY — DX: Depression, unspecified: F32.A

## 2013-09-20 HISTORY — DX: Anxiety disorder, unspecified: F41.9

## 2013-09-20 LAB — COMPREHENSIVE METABOLIC PANEL
ALK PHOS: 81 U/L (ref 39–117)
ALT: 13 U/L (ref 0–35)
ALT: 14 U/L (ref 0–35)
AST: 16 U/L (ref 0–37)
AST: 16 U/L (ref 0–37)
Albumin: 3.2 g/dL — ABNORMAL LOW (ref 3.5–5.2)
Albumin: 3.1 g/dL — ABNORMAL LOW (ref 3.5–5.2)
Alkaline Phosphatase: 83 U/L (ref 39–117)
BUN: 14 mg/dL (ref 6–23)
BUN: 14 mg/dL (ref 6–23)
CALCIUM: 8.7 mg/dL (ref 8.4–10.5)
CALCIUM: 8.6 mg/dL (ref 8.4–10.5)
CO2: 27 meq/L (ref 19–32)
CO2: 22 mEq/L (ref 19–32)
Chloride: 102 mEq/L (ref 96–112)
Chloride: 102 mEq/L (ref 96–112)
Creatinine, Ser: 1.05 mg/dL (ref 0.50–1.10)
Creatinine, Ser: 1.07 mg/dL (ref 0.50–1.10)
GFR, EST AFRICAN AMERICAN: 62 mL/min — AB (ref >90)
GFR, EST AFRICAN AMERICAN: 60 mL/min — AB (ref >90)
GFR, EST NON AFRICAN AMERICAN: 53 mL/min — AB (ref >90)
GFR, EST NON AFRICAN AMERICAN: 52 mL/min — AB (ref >90)
GLUCOSE: 214 mg/dL — AB (ref 70–99)
Glucose, Bld: 122 mg/dL — ABNORMAL HIGH (ref 70–99)
Potassium: 4.6 mEq/L (ref 3.7–5.3)
Potassium: 4.7 mEq/L (ref 3.7–5.3)
Sodium: 140 mEq/L (ref 137–147)
Sodium: 139 mEq/L (ref 137–147)
TOTAL PROTEIN: 7 g/dL (ref 6.0–8.3)
Total Bilirubin: 0.4 mg/dL (ref 0.3–1.2)
Total Bilirubin: 0.5 mg/dL (ref 0.3–1.2)
Total Protein: 7 g/dL (ref 6.0–8.3)

## 2013-09-20 LAB — I-STAT CHEM 8, ED
BUN: 20 mg/dL (ref 6–23)
CALCIUM ION: 1.05 mmol/L — AB (ref 1.13–1.30)
Chloride: 100 mEq/L (ref 96–112)
Creatinine, Ser: 1.3 mg/dL — ABNORMAL HIGH (ref 0.50–1.10)
GLUCOSE: 103 mg/dL — AB (ref 70–99)
HEMATOCRIT: 35 % — AB (ref 36.0–46.0)
Hemoglobin: 11.9 g/dL — ABNORMAL LOW (ref 12.0–15.0)
Potassium: 7 mEq/L (ref 3.7–5.3)
Sodium: 135 mEq/L — ABNORMAL LOW (ref 137–147)
TCO2: 28 mmol/L (ref 0–100)

## 2013-09-20 LAB — I-STAT TROPONIN, ED: Troponin i, poc: 0 ng/mL (ref 0.00–0.08)

## 2013-09-20 LAB — CBG MONITORING, ED
GLUCOSE-CAPILLARY: 99 mg/dL (ref 70–99)
Glucose-Capillary: 176 mg/dL — ABNORMAL HIGH (ref 70–99)

## 2013-09-20 LAB — CBC
HCT: 35.4 % — ABNORMAL LOW (ref 36.0–46.0)
Hemoglobin: 11.6 g/dL — ABNORMAL LOW (ref 12.0–15.0)
MCH: 29.7 pg (ref 26.0–34.0)
MCHC: 32.8 g/dL (ref 30.0–36.0)
MCV: 90.5 fL (ref 78.0–100.0)
PLATELETS: 171 10*3/uL (ref 150–400)
RBC: 3.91 MIL/uL (ref 3.87–5.11)
RDW: 13.9 % (ref 11.5–15.5)
WBC: 6.3 10*3/uL (ref 4.0–10.5)

## 2013-09-20 LAB — TSH: TSH: 1.64 u[IU]/mL (ref 0.350–4.500)

## 2013-09-20 LAB — TROPONIN I
Troponin I: <0.3 ng/mL (ref <0.30)
Troponin I: <0.3 ng/mL (ref <0.30)

## 2013-09-20 LAB — PRO B NATRIURETIC PEPTIDE: Pro B Natriuretic peptide (BNP): 265.7 pg/mL — ABNORMAL HIGH (ref 0–125)

## 2013-09-20 LAB — HEMOGLOBIN A1C
Hgb A1c MFr Bld: 5.6 % (ref <5.7)
Mean Plasma Glucose: 114 mg/dL (ref <117)

## 2013-09-20 LAB — MRSA PCR SCREENING: MRSA BY PCR: POSITIVE — AB

## 2013-09-20 LAB — POTASSIUM: Potassium: 5.1 mEq/L (ref 3.7–5.3)

## 2013-09-20 MED ORDER — VITAMIN D3 25 MCG (1000 UNIT) PO TABS
2000.0000 [IU] | ORAL_TABLET | Freq: Two times a day (BID) | ORAL | Status: DC
  Administered 2013-09-20 – 2013-09-28 (×16): 2000 [IU] via ORAL
  Filled 2013-09-20 (×17): qty 2

## 2013-09-20 MED ORDER — MORPHINE SULFATE 2 MG/ML IJ SOLN
2.0000 mg | Freq: Once | INTRAMUSCULAR | Status: AC
  Administered 2013-09-20: 2 mg via INTRAVENOUS
  Filled 2013-09-20: qty 1

## 2013-09-20 MED ORDER — MUPIROCIN 2 % EX OINT
1.0000 "application " | TOPICAL_OINTMENT | Freq: Two times a day (BID) | CUTANEOUS | Status: AC
  Administered 2013-09-20 – 2013-09-25 (×8): 1 via NASAL
  Filled 2013-09-20 (×2): qty 22

## 2013-09-20 MED ORDER — CARBOXYMETHYLCELLULOSE SODIUM 0.5 % OP SOLN: 1.0000 [drp] | Freq: Three times a day (TID) | OPHTHALMIC | Status: DC | PRN

## 2013-09-20 MED ORDER — CLOPIDOGREL BISULFATE 75 MG PO TABS
75.0000 mg | ORAL_TABLET | Freq: Every day | ORAL | Status: DC
  Administered 2013-09-21 – 2013-09-28 (×8): 75 mg via ORAL
  Filled 2013-09-20 (×10): qty 1

## 2013-09-20 MED ORDER — GI COCKTAIL ~~LOC~~
30.0000 mL | Freq: Once | ORAL | Status: AC
  Administered 2013-09-20: 30 mL via ORAL
  Filled 2013-09-20: qty 30

## 2013-09-20 MED ORDER — ENOXAPARIN SODIUM 40 MG/0.4ML ~~LOC~~ SOLN
40.0000 mg | SUBCUTANEOUS | Status: DC
  Administered 2013-09-20 – 2013-09-27 (×8): 40 mg via SUBCUTANEOUS
  Filled 2013-09-20 (×9): qty 0.4

## 2013-09-20 MED ORDER — IOHEXOL 300 MG/ML  SOLN
25.0000 mL | INTRAMUSCULAR | Status: AC
  Administered 2013-09-20: 25 mL via ORAL

## 2013-09-20 MED ORDER — SODIUM CHLORIDE 0.9 % IV BOLUS (SEPSIS)
1000.0000 mL | Freq: Once | INTRAVENOUS | Status: AC
  Administered 2013-09-20: 1000 mL via INTRAVENOUS

## 2013-09-20 MED ORDER — ALPRAZOLAM 0.25 MG PO TABS
0.2500 mg | ORAL_TABLET | Freq: Every evening | ORAL | Status: DC | PRN
  Administered 2013-09-21 – 2013-09-26 (×6): 0.25 mg via ORAL
  Filled 2013-09-20 (×7): qty 1

## 2013-09-20 MED ORDER — VITAMIN D 50 MCG (2000 UT) PO TABS: 2000.0000 [IU] | ORAL_TABLET | Freq: Two times a day (BID) | ORAL | Status: DC

## 2013-09-20 MED ORDER — ATORVASTATIN CALCIUM 10 MG PO TABS
10.0000 mg | ORAL_TABLET | Freq: Every day | ORAL | Status: DC
  Administered 2013-09-20 – 2013-09-27 (×8): 10 mg via ORAL
  Filled 2013-09-20 (×9): qty 1

## 2013-09-20 MED ORDER — LISINOPRIL 20 MG PO TABS
20.0000 mg | ORAL_TABLET | Freq: Every day | ORAL | Status: DC
  Administered 2013-09-21 – 2013-09-25 (×5): 20 mg via ORAL
  Filled 2013-09-20 (×6): qty 1

## 2013-09-20 MED ORDER — NITROGLYCERIN 0.4 MG SL SUBL
0.4000 mg | SUBLINGUAL_TABLET | SUBLINGUAL | Status: DC | PRN
  Administered 2013-09-20: 0.4 mg via SUBLINGUAL
  Filled 2013-09-20: qty 1

## 2013-09-20 MED ORDER — ATROPINE SULFATE 0.1 MG/ML IJ SOLN
INTRAMUSCULAR | Status: AC
  Filled 2013-09-20: qty 10

## 2013-09-20 MED ORDER — LORATADINE 10 MG PO TABS
10.0000 mg | ORAL_TABLET | Freq: Every day | ORAL | Status: DC
  Administered 2013-09-20 – 2013-09-28 (×9): 10 mg via ORAL
  Filled 2013-09-20 (×10): qty 1

## 2013-09-20 MED ORDER — CALCIUM CARBONATE ANTACID 500 MG PO CHEW
3.0000 | CHEWABLE_TABLET | Freq: Two times a day (BID) | ORAL | Status: DC
  Administered 2013-09-20 – 2013-09-28 (×15): 600 mg via ORAL
  Filled 2013-09-20 (×19): qty 3

## 2013-09-20 MED ORDER — BIOTENE DRY MOUTH MT LIQD
15.0000 mL | Freq: Two times a day (BID) | OROMUCOSAL | Status: DC
  Administered 2013-09-20 – 2013-09-21 (×3): 15 mL via OROMUCOSAL

## 2013-09-20 MED ORDER — MORPHINE SULFATE 2 MG/ML IJ SOLN
2.0000 mg | INTRAMUSCULAR | Status: DC | PRN
  Administered 2013-09-20 – 2013-09-28 (×21): 2 mg via INTRAVENOUS
  Filled 2013-09-20 (×22): qty 1

## 2013-09-20 MED ORDER — INSULIN ASPART 100 UNIT/ML ~~LOC~~ SOLN
5.0000 [IU] | Freq: Once | SUBCUTANEOUS | Status: AC
  Administered 2013-09-20: 5 [IU] via INTRAVENOUS
  Filled 2013-09-20: qty 1

## 2013-09-20 MED ORDER — CALCIUM CARBONATE 600 MG PO TABS
600.0000 mg | ORAL_TABLET | Freq: Two times a day (BID) | ORAL | Status: DC
  Filled 2013-09-20 (×2): qty 1

## 2013-09-20 MED ORDER — IOHEXOL 300 MG/ML  SOLN
100.0000 mL | Freq: Once | INTRAMUSCULAR | Status: AC | PRN
  Administered 2013-09-20: 100 mL via INTRAVENOUS

## 2013-09-20 MED ORDER — CHLORHEXIDINE GLUCONATE CLOTH 2 % EX PADS
6.0000 | MEDICATED_PAD | Freq: Every day | CUTANEOUS | Status: AC
  Administered 2013-09-21 – 2013-09-25 (×4): 6 via TOPICAL

## 2013-09-20 MED ORDER — POLYVINYL ALCOHOL 1.4 % OP SOLN
1.0000 [drp] | Freq: Three times a day (TID) | OPHTHALMIC | Status: DC | PRN
  Filled 2013-09-20: qty 15

## 2013-09-20 MED ORDER — DEXTROSE 50 % IV SOLN
1.0000 | Freq: Once | INTRAVENOUS | Status: AC
  Administered 2013-09-20: 50 mL via INTRAVENOUS
  Filled 2013-09-20: qty 50

## 2013-09-20 MED ORDER — ONDANSETRON HCL 4 MG/2ML IJ SOLN
4.0000 mg | Freq: Once | INTRAMUSCULAR | Status: AC
  Administered 2013-09-20: 4 mg via INTRAVENOUS
  Filled 2013-09-20: qty 2

## 2013-09-20 MED ORDER — SALINE SPRAY 0.65 % NA SOLN
2.0000 | NASAL | Status: DC | PRN
  Filled 2013-09-20: qty 44

## 2013-09-20 MED ORDER — ACETAMINOPHEN 325 MG PO TABS
650.0000 mg | ORAL_TABLET | Freq: Four times a day (QID) | ORAL | Status: DC | PRN
  Administered 2013-09-20 – 2013-09-27 (×7): 650 mg via ORAL
  Filled 2013-09-20 (×7): qty 2

## 2013-09-20 MED ORDER — FLUOXETINE HCL 20 MG PO CAPS
20.0000 mg | ORAL_CAPSULE | Freq: Every day | ORAL | Status: DC
  Administered 2013-09-20 – 2013-09-25 (×6): 20 mg via ORAL
  Filled 2013-09-20 (×7): qty 1

## 2013-09-20 MED ORDER — PANTOPRAZOLE SODIUM 40 MG PO TBEC
80.0000 mg | DELAYED_RELEASE_TABLET | Freq: Every day | ORAL | Status: DC
  Administered 2013-09-20 – 2013-09-28 (×9): 80 mg via ORAL
  Filled 2013-09-20 (×10): qty 2

## 2013-09-20 NOTE — ED Notes (Signed)
Pt states CP that wraps around her chest and to her back. Pt states that it has been going on for a week. Pt has tried OTC medication for acid reflux with no relief. Pt also tried laxative with no relief.

## 2013-09-20 NOTE — ED Notes (Signed)
Ordered pt heart healthy meal tray

## 2013-09-20 NOTE — ED Notes (Signed)
Pt will go to [place] before going to [place].

## 2013-09-20 NOTE — ED Notes (Signed)
This RN phoned phlebotomy regarding CMP and Potassium levels.  New tubes sent via phlebotomy.

## 2013-09-20 NOTE — ED Notes (Signed)
Stat Potassium reordered per lab, pending results.

## 2013-09-20 NOTE — ED Notes (Signed)
Gave pt turkey sandwich and coke 

## 2013-09-20 NOTE — Progress Notes (Signed)
CRITICAL VALUE ALERT   Critical value received:  MRSA screen positive  Date of notification:  09/20/2013   Time of notification:  2050  Critical value read back: yes  Nurse who received alert:  Reatha Armour RN  MD notified (1st page):    Time of first page:   MD notified (2nd page):  Time of second page:  Responding MD:    Time MD responded:

## 2013-09-20 NOTE — Progress Notes (Signed)
Pt arrived from ED per stretcher. Alert/oriented CHG bath done. VS done placed on monitors.

## 2013-09-20 NOTE — ED Provider Notes (Signed)
CSN: [id number]     Arrival date & time 09/20/13  0309 History   First MD Initiated Contact with Patient 09/20/13 [phone]     Chief Complaint  Patient presents with  . Chest Pain     (Consider location/radiation/quality/duration/timing/severity/associated sxs/prior Treatment) HPI History provided by patient. History of hypertension, hyperlipidemia and GERD. Has had left-sided chest pain on and off last week. Taking Prilosec and home medications without relief. Tonight unable to sleep and pain seems worse and is constant. She gets some dyspnea on exertion denies shortness of breath at rest. No diaphoresis or nausea. Symptoms moderate in severity. Denies history of same. No known history of heart problems. No known alleviating factors. Past Medical History  Diagnosis Date  . Arthritis   . Hypertension   . GERD (gastroesophageal reflux disease)   . Hypercholesteremia   . Heart murmur   . Depression   . H/O hiatal hernia   . Shortness of breath     "stomach is in lungs"  . Headache(784.0)     sinus   Past Surgical History  Procedure Laterality Date  . Total knee arthroplasty    . Eye surgery  2013    tube placed Left  . Esophageal manometry N/A 07/02/2012    Procedure: ESOPHAGEAL MANOMETRY (EM);  Surgeon: Lear Ng, MD;  Location: [facility];  Service: Endoscopy;  Laterality: N/A;  schooler to read  . Joint replacement Bilateral 2011,2012    knees  . Breast surgery      r breast biopsy  . Tonsillectomy  as child  . Hiatal hernia repair N/A 08/10/2012    Procedure: LAPAROSCOPIC REPAIR OF HIATAL HERNIA WITH MESH AND EGD WITH PEG TUBE PLACEMENT;  Surgeon: Ralene Ok, MD;  Location: [facility];  Service: General;  Laterality: N/A;   Family History  Problem Relation Age of Onset  . Heart disease Mother   . Heart disease Father   . Cancer Father     skin  . Cancer Sister     breast   History  Substance Use Topics  . Smoking status: Never Smoker   . Smokeless tobacco:  Never Used  . Alcohol Use: No   OB History   Grav Para Term Preterm Abortions TAB SAB Ect Mult Living                 Review of Systems  Constitutional: Negative for fever and chills.  Respiratory: Positive for shortness of breath. Negative for cough.   Cardiovascular: Positive for chest pain.  Gastrointestinal: Negative for abdominal pain.  Genitourinary: Negative for dysuria.  Musculoskeletal: Negative for back pain, neck pain and neck stiffness.  Skin: Negative for rash.  Neurological: Negative for headaches.  All other systems reviewed and are negative.     Allergies  Aleve; Aspirin; Ciprofloxacin; Doxycycline; Evista; Fosamax; Pravachol; Sulfa antibiotics; Welchol; and Zocor  Home Medications   Prior to Admission medications   Medication Sig Start Date End Date Taking? Authorizing Provider  ALPRAZolam Duanne Moron) 0.25 MG tablet Take 0.25 mg by mouth at bedtime as needed for sleep or anxiety.    Yes Historical Provider, MD  calcium carbonate (OS-CAL) 600 MG TABS Take 600 mg by mouth 2 (two) times daily with a meal.   Yes Historical Provider, MD  Cholecalciferol (VITAMIN D) 2000 UNITS tablet Take 2,000 Units by mouth 2 (two) times daily.    Yes Historical Provider, MD  fexofenadine (ALLEGRA) 180 MG tablet Take 180 mg by mouth daily.   Yes  Historical Provider, MD  FLUoxetine (PROZAC) 20 MG capsule Take 20 mg by mouth daily.  09/08/13  Yes Historical Provider, MD  lisinopril (PRINIVIL,ZESTRIL) 20 MG tablet Take 20 mg by mouth daily before breakfast.    Yes Historical Provider, MD  lovastatin (MEVACOR) 40 MG tablet Take 40 mg by mouth daily before breakfast.    Yes Historical Provider, MD  meclizine (ANTIVERT) 25 MG tablet Take 12.5-25 mg by mouth 3 (three) times daily as needed for dizziness.   Yes Historical Provider, MD  meloxicam (MOBIC) 15 MG tablet Take 1 tablet (15 mg total) by mouth daily. 08/13/13  Yes Max T Hyatt, DPM  omeprazole (PRILOSEC) 40 MG capsule Take 40 mg by mouth  daily.   Yes Historical Provider, MD   BP 122/79  Pulse 62  Temp(Src) 98.5 F (36.9 C) (Oral)  Resp 23  Ht 5' (1.524 m)  Wt 180 lb (81.647 kg)  BMI 35.15 kg/m2  SpO2 100% Physical Exam  Constitutional: She is oriented to person, place, and time. She appears well-developed and well-nourished.  HENT:  Head: Normocephalic and atraumatic.  Eyes: EOM are normal. Pupils are equal, round, and reactive to light.  Neck: Neck supple.  Cardiovascular: Normal rate, regular rhythm and intact distal pulses.   Pulmonary/Chest: Effort normal. No respiratory distress. She exhibits no tenderness.  Abdominal: Soft. Bowel sounds are normal. She exhibits no distension.  Musculoskeletal: Normal range of motion. She exhibits no tenderness.  Symmetric trace pretibial edema  Neurological: She is alert and oriented to person, place, and time.  Skin: Skin is warm and dry.    ED Course  Procedures (including critical care time) Labs Review Labs Reviewed  CBC - Abnormal; Notable for the following:    Hemoglobin 11.6 (*)    HCT 35.4 (*)    All other components within normal limits  I-STAT CHEM 8, ED - Abnormal; Notable for the following:    Sodium 135 (*)    Potassium 7.0 (*)    Creatinine, Ser 1.30 (*)    Glucose, Bld 103 (*)    Calcium, Ion 1.05 (*)    Hemoglobin 11.9 (*)    HCT 35.0 (*)    All other components within normal limits  COMPREHENSIVE METABOLIC PANEL  I-STAT TROPOININ, ED    Imaging Review Dg Chest Portable 1 View  09/20/2013   CLINICAL DATA:  Chest pain  EXAM: PORTABLE CHEST - 1 VIEW  COMPARISON:  08/12/2012  FINDINGS: Generous heart size, without definitive cardiomegaly. Unremarkable upper mediastinal contours. No edema or consolidation. No effusion or pneumothorax.  IMPRESSION: No active disease.   Electronically Signed   By: Jorje Guild M.D.   On: 09/20/2013 04:22     EKG Interpretation   Date/Time:  Friday Sep 20 2013 03:12:58 EDT Ventricular Rate:  92 PR Interval:   140 QRS Duration: 68 QT Interval:  364 QTC Calculation: 450 R Axis:   78 Text Interpretation:  Sinus rhythm with occasional and consecutive  Premature ventricular complexes Abnormal ECG Confirmed by Keenya Matera  MD, Lindalou Soltis  ([id number]) on 09/20/2013 4:44:37 AM     IV morphine improved pain but still some discomfort. NTG dropped her BP and HR to 40s - this improved with IVFs. MED c/s and admit.  MDM   Dx: CP  ECG, labs and imaging obtained and reviewed. Medications provided. IVfs for hypotension after NTG. MED admit.    Teressa Lower, MD 09/21/13 [phone]

## 2013-09-20 NOTE — ED Notes (Signed)
Pt aware that she will be going to stepdown now instead of 3W.

## 2013-09-20 NOTE — ED Notes (Signed)
Notified CT that pt is finished with contrast. Attempted to call report to [facility] x1

## 2013-09-20 NOTE — ED Notes (Signed)
Pt states the chest pain started on Sunday and has progressively worsened.  Pt states she has felt lightheaded, dizzy, back pain and associated sweating.  Pt states the pain is worse with palpation and when she is laid back completely.  Pt in the room is Normal sinus with occasional PVC, alert and oriented, no sweating.

## 2013-09-20 NOTE — ED Notes (Signed)
After giving first nito, pt's HR dropped to 38, BP 56/49. Notified MD. Pt alert, very diaphoretic, still having chest pain. Started fluid bolus, obtained EKG, checked CBG. MD at bedside. Pt's HR now 62, BP 78/41. Pt able to talk, feeling slightly better. Admitting MD at bedside now. Ordered not to give any more nitro. Will continue to monitor.

## 2013-09-20 NOTE — H&P (Addendum)
[facility] History and Physical  Angela Bond:[id number] DOB: [birth year] DOA: 09/20/2013  Referring physician:  Doylene Bode PCP:  Simona Huh, MD   Chief Complaint:  Chest pressure, SOB  HPI:  The patient is a 68 y.o. year-old female with history of hypertension, hyperlipidemia, PAF during hiatal hernia surgery last year which resolved (not on a/c), acid reflux, arthritis who presents from home with chest pressure.  The patient was last at their baseline health until about a month ago.  She had a fall on her right side which caused a scalp hematoma and left knee hematoma.  After that time, she has been experiencing DOE with simple ambulation with profuse swelling.  No obvious chest pressure or nausea but does feel a presyncopal.  Six days ago, she developed chest pressure which radiates from her left lower scapular area to the anterior left chest 8/10, constant, not associated with nausea, vomiting, diaphoresis, SOB worse than before.  She does have some pain which radiates to the right chest.  Eating causes increased pressure as does pressing on the left upper abdomen.  She thought her symptoms were related to acid reflux, but tums, rolaids, gas-x did not help.  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    In the emergency department, she was given morphine which helped her pain.  Labs were notable for mild normocytic anemia, BMP was hemolyzed a followup potassium was within normal limits. Troponin was negative. EKG demonstrated normal sinus rhythm with PVCs and no ST segment abnormalities or T-wave inversions. Chest x-ray was unremarkable.  She received a dose of nitroglycerin which dropped her blood pressure to the 81X systolic and made her feel weak and lightheaded.  Her chest pressure was improved from 8/10 to 4/10.  BP is starting to recover.    Review of Systems:  General:  Denies fevers, chills, weight loss or  gain HEENT:  Denies changes to hearing and vision, rhinorrhea, sinus congestion, sore throat CV:  Denies PND, orthopnea, and lower extremity edema.  PULM:  Denies wheezing, cough.   GI:  Denies nausea, vomiting, constipation, diarrhea.   GU:  Denies dysuria, frequency, urgency ENDO:  Denies polyuria, polydipsia.   HEME:  Denies hematemesis, blood in stools, melena, abnormal bruising or bleeding.  LYMPH:  Denies lymphadenopathy.   MSK:  Denies arthralgias, myalgias.   DERM:  Denies skin rash or ulcer.   NEURO:  Denies focal numbness, weakness, slurred speech, confusion, facial droop.  PSYCH:  Denies anxiety and depression symptoms.   Past Medical History  Diagnosis Date  . Arthritis   . Hypertension   . GERD (gastroesophageal reflux disease)   . Hypercholesteremia   . Heart murmur   . Anxiety and depression   . H/O hiatal hernia   . Shortness of breath     "stomach is in lungs"  . Headache(784.0)     sinus   Past Surgical History  Procedure Laterality Date  . Total knee arthroplasty    . Eye surgery  2013    tube placed Left  . Esophageal manometry N/A 07/02/2012    Procedure: ESOPHAGEAL MANOMETRY (EM);  Surgeon: Lear Ng, MD;  Location: [facility];  Service: Endoscopy;  Laterality: N/A;  schooler to read  . Joint replacement Bilateral 2011,2012    knees  . Breast surgery      r breast biopsy  . Tonsillectomy  as child  . Hiatal hernia repair  N/A 08/10/2012    Procedure: LAPAROSCOPIC REPAIR OF HIATAL HERNIA WITH MESH AND EGD WITH PEG TUBE PLACEMENT;  Surgeon: Ralene Ok, MD;  Location: [facility];  Service: General;  Laterality: N/A;   Social History:  reports that she has never smoked. She has never used smokeless tobacco. She reports that she does not drink alcohol or use illicit drugs. Lives with husband.  Does not use assist device.    Allergies  Allergen Reactions  . Aleve [Naproxen]   . Aspirin     Burns stomach  . Ciprofloxacin Nausea And Vomiting   . Doxycycline Nausea And Vomiting  . Evista [Raloxifene]     Leg aches  . Fosamax [Alendronate Sodium]     Leg aches  . Pravachol [Pravastatin]     GI surgery  . Sulfa Antibiotics Nausea Only  . Welchol [Colesevelam Hcl]     Stomach cramps  . Zocor [Simvastatin] Rash    Family History  Problem Relation Age of Onset  . Heart disease Mother     39s, CHF  . Heart disease Father     d/o MI at 58  . Cancer Father     skin  . Cancer Sister     breast  . Heart disease Sister     CABG in [place]     Prior to Admission medications   Medication Sig Start Date End Date Taking? Authorizing Provider  ALPRAZolam Duanne Moron) 0.25 MG tablet Take 0.25 mg by mouth at bedtime as needed for sleep or anxiety.    Yes Historical Provider, MD  calcium carbonate (OS-CAL) 600 MG TABS Take 600 mg by mouth 2 (two) times daily with a meal.   Yes Historical Provider, MD  Cholecalciferol (VITAMIN D) 2000 UNITS tablet Take 2,000 Units by mouth 2 (two) times daily.    Yes Historical Provider, MD  fexofenadine (ALLEGRA) 180 MG tablet Take 180 mg by mouth daily.   Yes Historical Provider, MD  FLUoxetine (PROZAC) 20 MG capsule Take 20 mg by mouth daily.  09/08/13  Yes Historical Provider, MD  lisinopril (PRINIVIL,ZESTRIL) 20 MG tablet Take 20 mg by mouth daily before breakfast.    Yes Historical Provider, MD  lovastatin (MEVACOR) 40 MG tablet Take 40 mg by mouth daily before breakfast.    Yes Historical Provider, MD  meclizine (ANTIVERT) 25 MG tablet Take 12.5-25 mg by mouth 3 (three) times daily as needed for dizziness.   Yes Historical Provider, MD  meloxicam (MOBIC) 15 MG tablet Take 1 tablet (15 mg total) by mouth daily. 08/13/13  Yes Max T Hyatt, DPM  omeprazole (PRILOSEC) 40 MG capsule Take 40 mg by mouth daily.   Yes Historical Provider, MD   Physical Exam: Filed Vitals:   09/20/13 0645 09/20/13 0756 09/20/13 0800 09/20/13 0830  BP: 117/59 107/65 109/56 94/52  Pulse: 67  73 60  Temp:      TempSrc:       Resp: 12  15 23   Height:      Weight:      SpO2: 96%  98% 98%     General:  CF, NAD  Eyes:  PERRL, anicteric, non-injected.  ENT:  Nares clear.  OP clear, non-erythematous without plaques or exudates.  MMM.  Neck:  Supple without TM or JVD.    Lymph:  No cervical, supraclavicular, or submandibular LAD.  Cardiovascular:  RRR, normal S1, S2, without m/r/g.  2+ pulses, warm extremities  Respiratory:  CTA bilaterally without increased WOB.  Abdomen:  NABS.  Soft, ND, mild TTP in the left upper quadrant without rebound or guarding which reproduces her pain    Skin:  No rashes or focal lesions.  Musculoskeletal:  Normal bulk and tone.  No LE edema.  Psychiatric:  A & O x 4.  Appropriate affect.  Neurologic:  CN 3-12 intact.  5/5 strength.  Sensation intact.  Labs on Admission:  Basic Metabolic Panel:  Recent Labs Lab 09/20/13 0412 09/20/13 0548 09/20/13 0755  NA 135*  --  140  K 7.0* 5.1 4.6  CL 100  --  102  CO2  --   --  27  GLUCOSE 103*  --  214*  BUN 20  --  14  CREATININE 1.30*  --  1.05  CALCIUM  --   --  8.7   Liver Function Tests:  Recent Labs Lab 09/20/13 0755  AST 16  ALT 13  ALKPHOS 83  BILITOT 0.4  PROT 7.0  ALBUMIN 3.2*   No results found for this basename: LIPASE, AMYLASE,  in the last 168 hours No results found for this basename: AMMONIA,  in the last 168 hours CBC:  Recent Labs Lab 09/20/13 0402 09/20/13 0412  WBC 6.3  --   HGB 11.6* 11.9*  HCT 35.4* 35.0*  MCV 90.5  --   PLT 171  --    Cardiac Enzymes: No results found for this basename: CKTOTAL, CKMB, CKMBINDEX, TROPONINI,  in the last 168 hours  BNP (last 3 results)  Recent Labs  09/20/13 0548  PROBNP 265.7*   CBG:  Recent Labs Lab 09/20/13 0725 09/20/13 0818  GLUCAP 99 176*    Radiological Exams on Admission: Dg Chest Portable 1 View  09/20/2013   CLINICAL DATA:  Chest pain  EXAM: PORTABLE CHEST - 1 VIEW  COMPARISON:  08/12/2012  FINDINGS: Generous heart  size, without definitive cardiomegaly. Unremarkable upper mediastinal contours. No edema or consolidation. No effusion or pneumothorax.  IMPRESSION: No active disease.   Electronically Signed   By: Jorje Guild M.D.   On: 09/20/2013 04:22    EKG: Independently reviewed. NSR with PVC  Assessment/Plan Active Problems:   Atrial fibrillation   Hypertension   GERD (gastroesophageal reflux disease)   Depression   Hypercholesteremia   Arthritis   Chest pressure   Hypotension due to drugs  ---  Chest pressure with 1 month hx of DOE with diaphoresis, heart score of 4.  Patients symptoms seem less likely to be cardiac except for her pain improving with nitroglycerin.  I am worried her fall disrupted her hiatal hernia repair which was done last year which would explain why her symptoms are worse with eating.   -  Observation in stepdown as patient is having ongoing pain -  Cycle troponins -  Echocardiogram -  We will start Plavix 75 mg daily as patient has aspirin allergy pending further workup -  Hold NTG due to recent hypotension -  Continue ACE inhibitor and statin -  Cardiology to determine if she would benefit from inpatient stress test -  CT abdomen and pelvis with contrast to evaluate hiatal hernia repair  Hypotension due to NTG, should improve with time and IVF bolus -  Continue IVF bolus -  Hold BP medications until BP recovered  PAF post-operatively last year -  Telemetry -  Not on A/C at baseline -  Defer to cardiology  Depression/anxiety, stable, continue Xanax and fluoxetine  Arthritis, stable, hold Mobic for now pending further evaluation for ACS  GERD, stable, continue PPI  Diet:   healthy heart Access:   PIV IVF:   off Proph:   Lovenox  Code Status: full code Family Communication: patient and husband Disposition Plan: Admit to stepdown due to hypotension and ongoing chest pressure  Time spent: 60 min [facility] Pager  [phone]  If 7PM-7AM, please contact night-coverage [email or web address] Password [facility] 09/20/2013, 8:53 AM

## 2013-09-20 NOTE — ED Notes (Signed)
Notified RN CBG [id number]

## 2013-09-20 NOTE — ED Notes (Signed)
Notified MD that pt still having chest pain. MD ordered GI cocktail. Pt states she is ok with her pain level at this time. MD does not believe that this cardiac chest pain.

## 2013-09-20 NOTE — ED Notes (Signed)
Pt states she is feeling better but still having chest pain.

## 2013-09-21 ENCOUNTER — Observation Stay (HOSPITAL_COMMUNITY): Payer: Medicare Other

## 2013-09-21 DIAGNOSIS — R079 Chest pain, unspecified: Secondary | ICD-10-CM

## 2013-09-21 LAB — CBC
HCT: 34.7 % — ABNORMAL LOW (ref 36.0–46.0)
Hemoglobin: 11 g/dL — ABNORMAL LOW (ref 12.0–15.0)
MCH: 29.3 pg (ref 26.0–34.0)
MCHC: 31.7 g/dL (ref 30.0–36.0)
MCV: 92.5 fL (ref 78.0–100.0)
PLATELETS: 163 10*3/uL (ref 150–400)
RBC: 3.75 MIL/uL — ABNORMAL LOW (ref 3.87–5.11)
RDW: 13.7 % (ref 11.5–15.5)
WBC: 5.6 10*3/uL (ref 4.0–10.5)

## 2013-09-21 LAB — LIPID PANEL
CHOL/HDL RATIO: 3 ratio
Cholesterol: 166 mg/dL (ref 0–200)
HDL: 55 mg/dL (ref >39)
LDL Cholesterol: 90 mg/dL (ref 0–99)
TRIGLYCERIDES: 107 mg/dL (ref <150)
VLDL: 21 mg/dL (ref 0–40)

## 2013-09-21 LAB — BASIC METABOLIC PANEL
BUN: 15 mg/dL (ref 6–23)
CO2: 25 mEq/L (ref 19–32)
CREATININE: 1.1 mg/dL (ref 0.50–1.10)
Calcium: 8.5 mg/dL (ref 8.4–10.5)
Chloride: 101 mEq/L (ref 96–112)
GFR calc non Af Amer: 50 mL/min — ABNORMAL LOW (ref >90)
GFR, EST AFRICAN AMERICAN: 58 mL/min — AB (ref >90)
Glucose, Bld: 104 mg/dL — ABNORMAL HIGH (ref 70–99)
Potassium: 4.9 mEq/L (ref 3.7–5.3)
Sodium: 137 mEq/L (ref 137–147)

## 2013-09-21 LAB — TROPONIN I

## 2013-09-21 MED ORDER — ONDANSETRON HCL 4 MG/2ML IJ SOLN
4.0000 mg | Freq: Four times a day (QID) | INTRAMUSCULAR | Status: DC | PRN
  Administered 2013-09-21 – 2013-09-25 (×3): 4 mg via INTRAVENOUS
  Filled 2013-09-21 (×3): qty 2

## 2013-09-21 MED ORDER — TECHNETIUM TC 99M SESTAMIBI - CARDIOLITE
30.0000 | Freq: Once | INTRAVENOUS | Status: AC | PRN
  Administered 2013-09-21: 30 via INTRAVENOUS

## 2013-09-21 MED ORDER — REGADENOSON 0.4 MG/5ML IV SOLN
0.4000 mg | Freq: Once | INTRAVENOUS | Status: AC
  Administered 2013-09-21: 0.4 mg via INTRAVENOUS
  Filled 2013-09-21: qty 5

## 2013-09-21 MED ORDER — REGADENOSON 0.4 MG/5ML IV SOLN
INTRAVENOUS | Status: AC
  Administered 2013-09-21: 0.4 mg via INTRAVENOUS
  Filled 2013-09-21: qty 5

## 2013-09-21 MED ORDER — TECHNETIUM TC 99M SESTAMIBI - CARDIOLITE
10.0000 | Freq: Once | INTRAVENOUS | Status: AC | PRN
  Administered 2013-09-21: 10:00:00 10 via INTRAVENOUS

## 2013-09-21 MED ORDER — ONDANSETRON HCL 4 MG/2ML IJ SOLN
INTRAMUSCULAR | Status: AC
  Filled 2013-09-21: qty 2

## 2013-09-21 NOTE — Progress Notes (Signed)
Notified Dr. Hartford Poli via text page that pt has red rash to back. Pt states that the area itches. Pt states that she gets rash to back at home sometimes. Pt states that she puts baby powder at home on rash. Applied baby powder to area on back per pt's request. Will continue to monitor pt. Ranelle Oyster, RN

## 2013-09-21 NOTE — Progress Notes (Signed)
NURSING PROGRESS NOTE  Angela Bond [id number] Transfer Data: 09/21/2013 6:18 PM Attending Provider: Theodis Blaze, MD CBU:LAGTXMI,WOEHOZ R, MD Code Status: FULL   Angela Bond is a 68 y.o. female patient transferred from [place]  -No acute distress noted.  -No complaints of shortness of breath.  -No complaints of chest pain.   Cardiac Monitoring: Box # 21 in place. Cardiac monitor yields:normal sinus rhythm.  Blood pressure 111/76, pulse 74, temperature 98.8 F (37.1 C), temperature source Oral, resp. rate 18, height 5' (1.524 m), weight 81.012 kg (178 lb 9.6 oz), SpO2 98.00%.   IV Fluids:  IV in place, occlusive dsg intact without redness, IV cath wrist right, condition patent and no redness none.   Allergies:  Aleve; Aspirin; Ciprofloxacin; Doxycycline; Evista; Fosamax; Pravachol; Sulfa antibiotics; Welchol; and Zocor  Past Medical History:   has a past medical history of Arthritis; Hypertension; GERD (gastroesophageal reflux disease); Hypercholesteremia; Heart murmur; Anxiety and depression; H/O hiatal hernia; Shortness of breath; and Headache(784.0).  Past Surgical History:   has past surgical history that includes Total knee arthroplasty; Eye surgery (2013); Esophageal manometry (N/A, 07/02/2012); Joint replacement (Bilateral, [id number]); Breast surgery; Tonsillectomy (as child); and Hiatal hernia repair (N/A, 08/10/2012).  Social History:   reports that she has never smoked. She has never used smokeless tobacco. She reports that she does not drink alcohol or use illicit drugs.  Skin: intact  Patient/Family orientated to room. Information packet given to patient/family. Admission inpatient armband information verified with patient/family to include name and date of birth and placed on patient arm. Side rails up x 2, fall assessment and education completed with patient/family. Patient/family able to verbalize understanding of risk associated with falls and verbalized understanding  to call for assistance before getting out of bed. Call light within reach. Patient/family able to voice and demonstrate understanding of unit orientation instructions.    Will continue to evaluate and treat per MD orders.

## 2013-09-21 NOTE — Progress Notes (Signed)
Patient ID: CIRCE CHILTON, female   DOB: 1945-12-25, 68 y.o.   MRN: [id number]  [facility] PROGRESS NOTE  SAKOYA WIN NWG:[id number] DOB: 09/14/1945 DOA: 09/20/2013 PCP: Simona Huh, MD  Brief narrative: 68 y.o. year-old female with history of HTN, HLD, PAF during hiatal hernia surgery last year which resolved (not on a/c), acid reflux, arthritis who presents from home with chest pain. The patient was last at her baseline health until about a month ago. She had a fall on her right side which caused a scalp hematoma and left knee hematoma. After that time, she has been experiencing DOE with simple ambulation, associated with diaphoresis. She describes chest pain as pressure like, 8/10 in severity, intermittent, radiating to the left axilla and laft subscapular area.   In the emergency department, she was given morphine which helped with pain. Labs were notable for mild normocytic anemia, BMP was hemolyzed a followup potassium was within normal limits. Troponin was negative. EKG demonstrated normal sinus rhythm with PVCs and no ST segment abnormalities or T-wave inversions. Chest x-ray was unremarkable. She received a dose of nitroglycerin which dropped her blood pressure to the 57Q systolic and made her feel weak and lightheaded. Her chest pressure was improved from 8/10 to 4/10.   Active Problems:   Chest pain - unclear etiology adn possibly just musculoskeletal - plan for Lexiscan test today, cardiology will see if test abnormal  - CE's 3 negative, no events on telemetry and pt is hemodynamically stable this AM - possible transfer this afternoon to telemetry bed if lexiscan test negative    Atrial fibrillation - in sinus rhythm this AM, rate controoled    Acute renal failure - repeat BMP indicating normal renal function    Low back pain - proceed with lumbar spine MRI for further evaluation    Hypertension - stable this AM with SBP in 130's    GERD (gastroesophageal reflux  disease) - stable, continue PPI   Depression - stable clinically    Hypercholesteremia - continue statin   Consultants:  None  Procedures/Studies: Ct Abdomen Pelvis W Contrast   09/20/2013 No acute abnormality in the abdomen or pelvis. Small hiatal hernia with adjacent surgical changes suggesting prior hernia repair. Sludge and/or small stones layer within the gallbladder lumen. No secondary signs to suggest acute cholecystitis. Lower lumbar facet arthropathy.    Dg Chest Portable 1 View  09/20/2013  No active disease.   Antibiotics:  None  Code Status: Full Family Communication: Pt at bedside Disposition Plan: Home when medically stable, will likley be able to transfer to tele today if stress test negative   HPI/Subjective: No events overnight.   Objective: Filed Vitals:   09/20/13 1930 09/21/13 0007 09/21/13 0457 09/21/13 0500  BP: 121/75 121/70 132/78   Pulse: 76 74 79   Temp: 98.1 F (36.7 C) 98.5 F (36.9 C) 98.4 F (36.9 C)   TempSrc: Oral Oral Oral   Resp: 14 14 17    Height:      Weight:    82 kg (180 lb 12.4 oz)  SpO2: 100% 99% 100%     Intake/Output Summary (Last 24 hours) at 09/21/13 0831 Last data filed at 09/20/13 2155  Gross per 24 hour  Intake    620 ml  Output    450 ml  Net    170 ml    Exam:   General:  Pt is alert, follows commands appropriately, not in acute distress  Cardiovascular: Regular rate and rhythm,  S1/S2, no murmurs, no rubs, no gallops  Respiratory: Clear to auscultation bilaterally, no wheezing, no crackles, no rhonchi  Abdomen: Soft, non tender, non distended, bowel sounds present, no guarding  Extremities: No edema, pulses DP and PT palpable bilaterally  Neuro: Grossly nonfocal  Data Reviewed: Basic Metabolic Panel:  Recent Labs Lab 09/20/13 0412 09/20/13 0548 09/20/13 0755 09/20/13 1426 09/21/13 0332  NA 135*  --  140 139 137  K 7.0* 5.1 4.6 4.7 4.9  CL 100  --  102 102 101  CO2  --   --  27 22 25   GLUCOSE  103*  --  214* 122* 104*  BUN 20  --  14 14 15   CREATININE 1.30*  --  1.05 1.07 1.10  CALCIUM  --   --  8.7 8.6 8.5   Liver Function Tests:  Recent Labs Lab 09/20/13 0755 09/20/13 1426  AST 16 16  ALT 13 14  ALKPHOS 83 81  BILITOT 0.4 0.5  PROT 7.0 7.0  ALBUMIN 3.2* 3.1*   CBC:  Recent Labs Lab 09/20/13 0402 09/20/13 0412 09/21/13 0332  WBC 6.3  --  5.6  HGB 11.6* 11.9* 11.0*  HCT 35.4* 35.0* 34.7*  MCV 90.5  --  92.5  PLT 171  --  163   Cardiac Enzymes:  Recent Labs Lab 09/20/13 1426 09/20/13 2022 09/21/13 0332  TROPONINI <0.30 <0.30 <0.30   CBG:  Recent Labs Lab 09/20/13 0725 09/20/13 0818  GLUCAP 99 176*    Recent Results (from the past 240 hour(s))  MRSA PCR SCREENING     Status: Abnormal   Collection Time    09/20/13  5:15 PM      Result Value Ref Range Status   MRSA by PCR POSITIVE (*) NEGATIVE Final   Comment:            The GeneXpert MRSA Assay (FDA     approved for NASAL specimens     only), is one component of a     comprehensive MRSA colonization     surveillance program. It is not     intended to diagnose MRSA     infection nor to guide or     monitor treatment for     MRSA infections.     RESULT CALLED TO, READ BACK BY AND VERIFIED WITH:     A PETTIFORD RN 2049 09/20/13 A BROWNING     Scheduled Meds: . atorvastatin  10 mg Oral q1800  . calcium carbonate  3 tablet Oral BID WC  . cholecalciferol  2,000 Units Oral BID  . clopidogrel  75 mg Oral Q breakfast  . enoxaparin injection  40 mg Subcutaneous Q24H  . FLUoxetine  20 mg Oral Daily  . lisinopril  20 mg Oral QAC breakfast  . loratadine  10 mg Oral Daily  . pantoprazole  80 mg Oral Daily   Continuous Infusions:  Theodis Blaze, MD  [facility] Pager [phone]  If 7PM-7AM, please contact night-coverage [email or web address] Password [email or web address] 09/21/2013, 8:31 AM   LOS: 1 day

## 2013-09-21 NOTE — Progress Notes (Signed)
Dr. Hartford Poli called back about rash on pt's back. Pt states she gets rashes on her back and abdominal folds at home when she gets hot. MD stated that we can continue to put baby powder on pt's back. No new orders given. Will continue to monitor pt. Ranelle Oyster, RN

## 2013-09-21 NOTE — Progress Notes (Signed)
Report received for transfer to 5W36 

## 2013-09-21 NOTE — Discharge Instructions (Signed)
Chest Pain (Nonspecific) °It is often hard to give a specific diagnosis for the cause of chest pain. There is always a chance that your pain could be related to something serious, such as a heart attack or a blood clot in the lungs. You need to follow up with your caregiver for further evaluation. °CAUSES  °· Heartburn. °· Pneumonia or bronchitis. °· Anxiety or stress. °· Inflammation around your heart (pericarditis) or lung (pleuritis or pleurisy). °· A blood clot in the lung. °· A collapsed lung (pneumothorax). It can develop suddenly on its own (spontaneous pneumothorax) or from injury (trauma) to the chest. °· Shingles infection (herpes zoster virus). °The chest wall is composed of bones, muscles, and cartilage. Any of these can be the source of the pain. °· The bones can be bruised by injury. °· The muscles or cartilage can be strained by coughing or overwork. °· The cartilage can be affected by inflammation and become sore (costochondritis). °DIAGNOSIS  °Lab tests or other studies, such as X-rays, electrocardiography, stress testing, or cardiac imaging, may be needed to find the cause of your pain.  °TREATMENT  °· Treatment depends on what may be causing your chest pain. Treatment may include: °· Acid blockers for heartburn. °· Anti-inflammatory medicine. °· Pain medicine for inflammatory conditions. °· Antibiotics if an infection is present. °· You may be advised to change lifestyle habits. This includes stopping smoking and avoiding alcohol, caffeine, and chocolate. °· You may be advised to keep your head raised (elevated) when sleeping. This reduces the chance of acid going backward from your stomach into your esophagus. °· Most of the time, nonspecific chest pain will improve within 2 to 3 days with rest and mild pain medicine. °HOME CARE INSTRUCTIONS  °· If antibiotics were prescribed, take your antibiotics as directed. Finish them even if you start to feel better. °· For the next few days, avoid physical  activities that bring on chest pain. Continue physical activities as directed. °· Do not smoke. °· Avoid drinking alcohol. °· Only take over-the-counter or prescription medicine for pain, discomfort, or fever as directed by your caregiver. °· Follow your caregiver's suggestions for further testing if your chest pain does not go away. °· Keep any follow-up appointments you made. If you do not go to an appointment, you could develop lasting (chronic) problems with pain. If there is any problem keeping an appointment, you must call to reschedule. °SEEK MEDICAL CARE IF:  °· You think you are having problems from the medicine you are taking. Read your medicine instructions carefully. °· Your chest pain does not go away, even after treatment. °· You develop a rash with blisters on your chest. °SEEK IMMEDIATE MEDICAL CARE IF:  °· You have increased chest pain or pain that spreads to your arm, neck, jaw, back, or abdomen. °· You develop shortness of breath, an increasing cough, or you are coughing up blood. °· You have severe back or abdominal pain, feel nauseous, or vomit. °· You develop severe weakness, fainting, or chills. °· You have a fever. °THIS IS AN EMERGENCY. Do not wait to see if the pain will go away. Get medical help at once. Call your local emergency services (911 in [place] Do not drive yourself to the hospital. °MAKE SURE YOU:  °· Understand these instructions. °· Will watch your condition. °· Will get help right away if you are not doing well or get worse. °Document Released: 02/09/2005 Document Revised: 07/25/2011 Document Reviewed: 12/06/2007 °Exit[employer]® Patient Information ©2014 [employer]   [employer]. ° °

## 2013-09-21 NOTE — Progress Notes (Signed)
     Discussed with Dr. Doyle Askew. Patient with CP.  The patient shows up on our list but I do not see any consult notes.  I have ordered a [place] for her today. We will see her if the myoview is abnormal.  Please call for questions.   Thayer Headings, Brooke Bonito., MD, [facility] 09/21/2013, 9:11 AM Office - [phone] Pager [phone]

## 2013-09-22 LAB — CBC
HCT: 35.4 % — ABNORMAL LOW (ref 36.0–46.0)
Hemoglobin: 11.1 g/dL — ABNORMAL LOW (ref 12.0–15.0)
MCH: 28.9 pg (ref 26.0–34.0)
MCHC: 31.4 g/dL (ref 30.0–36.0)
MCV: 92.2 fL (ref 78.0–100.0)
Platelets: 144 10*3/uL — ABNORMAL LOW (ref 150–400)
RBC: 3.84 MIL/uL — ABNORMAL LOW (ref 3.87–5.11)
RDW: 13.8 % (ref 11.5–15.5)
WBC: 5.8 10*3/uL (ref 4.0–10.5)

## 2013-09-22 LAB — BASIC METABOLIC PANEL
BUN: 19 mg/dL (ref 6–23)
CALCIUM: 8.7 mg/dL (ref 8.4–10.5)
CO2: 26 mEq/L (ref 19–32)
Chloride: 99 mEq/L (ref 96–112)
Creatinine, Ser: 1.14 mg/dL — ABNORMAL HIGH (ref 0.50–1.10)
GFR calc non Af Amer: 48 mL/min — ABNORMAL LOW (ref >90)
GFR, EST AFRICAN AMERICAN: 56 mL/min — AB (ref >90)
Glucose, Bld: 103 mg/dL — ABNORMAL HIGH (ref 70–99)
POTASSIUM: 4.8 meq/L (ref 3.7–5.3)
SODIUM: 135 meq/L — AB (ref 137–147)

## 2013-09-22 MED ORDER — GABAPENTIN 100 MG PO CAPS
100.0000 mg | ORAL_CAPSULE | Freq: Three times a day (TID) | ORAL | Status: DC
  Administered 2013-09-22 – 2013-09-25 (×9): 100 mg via ORAL
  Filled 2013-09-22 (×12): qty 1

## 2013-09-22 MED ORDER — ACYCLOVIR 800 MG PO TABS
800.0000 mg | ORAL_TABLET | Freq: Every day | ORAL | Status: DC
  Administered 2013-09-22 – 2013-09-23 (×6): 800 mg via ORAL
  Filled 2013-09-22 (×10): qty 1

## 2013-09-22 NOTE — Progress Notes (Signed)
Patient has shingles in T12 dermatome.  The pustules are not crusted.  Infection control notified and advised that patient be moved to a unit with airborne negative pressure rooms.  MD notified.  Orders to transfer patient to unit with airborne rooms placed.  Will continue to monitor patient.

## 2013-09-22 NOTE — Progress Notes (Signed)
Pharmacy - po acyclovir  Shingles  Plan: 1) Acyclovir 800 mg po 5 x per day 2) Pharmacy will sign off - re consult if needed  Thank you. Anette Guarneri, PharmD

## 2013-09-22 NOTE — Progress Notes (Signed)
Attempted to call report to 6E. Stated that nurse or charge nurse would have to call back for report. Ranelle Oyster, RN

## 2013-09-22 NOTE — Progress Notes (Signed)
Pt transferred to 6E18 with pt belongings. Will continue to monitor pt. Ranelle Oyster, RN

## 2013-09-22 NOTE — Evaluation (Signed)
Physical Therapy Evaluation Patient Details Name: Angela Bond MRN: [id number] DOB: 02/23/1946 Today's Date: 09/22/2013   History of Present Illness  The patient was last at their baseline health until about a month ago.  She had a fall on her right side which caused a scalp hematoma and left knee hematoma.  After that time, she has been experiencing DOE with simple ambulation with profuse swelling.  Six days PTA, she developed chest pressure which radiates from her left lower scapular area to the anterior left chest 8/10, constant, not associated with nausea, vomiting, diaphoresis, SOB worse than before. Workup to discern Cardiac CP or more musculaoskeletal or GI related; Myoview orderd  Clinical Impression  Pt admitted with above. Pt currently with functional limitations due to the deficits listed below (see PT Problem List).   Noted some incr pain associated with reaching to the Left side while sitting EOB, which can indicate that the pain can be musculoskeletal in nature -- still she also indicates incr pain post eating; Encouraged pt to eat OOB in chair;    Pt will benefit from skilled PT to increase their independence and safety with mobility to allow discharge home, with Outpt PT follow up prn     Follow Up Recommendations Outpatient PT    Equipment Recommendations  None recommended by PT    Recommendations for Other Services       Precautions / Restrictions Precautions Precautions: Fall      Mobility  Bed Mobility Overal bed mobility: Modified Independent                Transfers Overall transfer level: Modified independent Equipment used: None                Ambulation/Gait Ambulation/Gait assistance: Supervision;Min guard Ambulation Distance (Feet): 120 Feet Assistive device: None;1 person hand held assist Gait Pattern/deviations: Step-through pattern Gait velocity: decr   General Gait Details: Overall walking well for shorter distances; at about  80 feet, pt indicated her back felt stff and required handheld assist back to room; one small loss of balance  Stairs            Wheelchair Mobility    Modified Rankin (Stroke Patients Only)       Balance                                             Pertinent Vitals/Pain 5/10 chest and scapular pain beginning of session 4/10 post amb and gentle spine twist    Home Living Family/patient expects to be discharged to:: Private residence Living Arrangements: Spouse/significant other Available Help at Discharge: Family;Available 24 hours/day (Unsure exactly how much assist husband can give) Type of Home: House Home Access: Stairs to enter Entrance Stairs-Rails: Left Entrance Stairs-Number of Steps: 6 Home Layout: One level Home Equipment: Walker - 2 wheels;Cane - single point;Bedside commode;Shower seat      Prior Function Level of Independence: Independent         Comments: drives; 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, newspaper        Extremity/Trunk Assessment   Upper Extremity Assessment: Overall WFL for tasks assessed           Lower Extremity Assessment: Overall WFL for tasks assessed         Communication   Communication: No difficulties  Cognition Arousal/Alertness:  Awake/alert Behavior During Therapy: WFL for tasks assessed/performed Overall Cognitive Status: Within Functional Limits for tasks assessed                      General Comments      Exercises Other Exercises Other Exercises: Gentle upper and lower back stretch; Hooklying in bed, easing knnes to one side while looking and reaching with arm to opposite side; 3 slow breath hold of stretch; 3 reps each side      Assessment/Plan    PT Assessment Patient needs continued PT services  PT Diagnosis Acute pain;Difficulty walking   PT Problem List Decreased activity tolerance;Decreased balance;Decreased knowledge of use of DME;Pain   PT Treatment Interventions DME instruction;Gait training;Stair training;Functional mobility training;Therapeutic activities;Therapeutic exercise;Patient/family education   PT Goals (Current goals can be found in the Care Plan section) Acute Rehab PT Goals Patient Stated Goal: Wants to "get to the bottom of this pain"; Workup continues PT Goal Formulation: With patient Time For Goal Achievement: 09/24/13 (Will likely dc acute PT after next session) Potential to Achieve Goals: Good    Frequency Min 3X/week   Barriers to discharge        Co-evaluation               End of Session Equipment Utilized During Treatment: Gait belt Activity Tolerance: Patient tolerated treatment well Patient left: in bed;with bed alarm set;with call bell/phone within reach;Other (comment) (bed in semi-chair position) Nurse Communication: Mobility status    Functional Assessment Tool Used: Clinical Judgement Functional Limitation: Mobility: Walking and moving around Mobility: Walking and Moving Around Current Status ([id number]): At least 1 percent but less than 20 percent impaired, limited or restricted Mobility: Walking and Moving Around Goal Status [phone]): 0 percent impaired, limited or restricted    Time: 0630-1601 PT Time Calculation (min): 24 min   Charges:   PT Evaluation $Initial PT Evaluation Tier I: 1 Procedure PT Treatments $Gait Training: 8-22 mins   PT G Codes:   Functional Assessment Tool Used: Clinical Judgement Functional Limitation: Mobility: Walking and moving around    [facility] 09/22/2013, 8:59 AM  Roney Marion, PT  Acute Rehabilitation Services Pager [phone] Office [phone]

## 2013-09-22 NOTE — Progress Notes (Signed)
Called report to Leighton, Therapist, sports on 6E. Will continue to monitor pt. Ranelle Oyster, RN

## 2013-09-22 NOTE — Progress Notes (Addendum)
Patient ID: Angela Bond, female   DOB: 1945/09/04, 68 y.o.   MRN: [id number]  [facility] PROGRESS NOTE  Angela Bond YSA:[id number] DOB: 1946/04/12 DOA: 09/20/2013 PCP: Simona Huh, MD  Brief narrative:  68 y.o. year-old female with history of HTN, HLD, PAF during hiatal hernia surgery last year which resolved (not on a/c), acid reflux, arthritis who presents from home with chest pain. The patient was last at her baseline health until about a month ago. She had a fall on her right side which caused a scalp hematoma and left knee hematoma. After that time, she has been experiencing DOE with simple ambulation, associated with diaphoresis. She describes chest pain as pressure like, 8/10 in severity, intermittent, radiating to the left axilla and laft subscapular area.   In the emergency department, she was given morphine which helped with pain. Labs were notable for mild normocytic anemia, BMP was hemolyzed a followup potassium was within normal limits. Troponin was negative. EKG demonstrated normal sinus rhythm with PVCs and no ST segment abnormalities or T-wave inversions. Chest x-ray was unremarkable. She received a dose of nitroglycerin which dropped her blood pressure to the 32T systolic and made her feel weak and lightheaded. Her chest pressure was improved from 8/10 to 4/10.   Active Problems:  Chest pain  - unclear etiology and possibly just musculoskeletal  - Lexiscan test negative  - CE's 3 negative, no events on telemetry and pt is hemodynamically stable this AM  Atrial fibrillation  - in sinus rhythm this AM, rate controoled  Acute renal failure  - repeat BMP in AM Low back pain  - MRI lumbar spine pending - I do think her pain is possibly coming from shingles as well - pt is pointing to low back area and left thoracic area as the main source of pain Shingles - dermatome lever T7 - acyclovir per pharmacy  Hypertension  - stable this AM with SBP in 130's  GERD  (gastroesophageal reflux disease)  - stable, continue PPI  Depression  - stable clinically  Hypercholesteremia  - continue statin   Consultants:  None Procedures/Studies:  Ct Abdomen Pelvis W Contrast 09/20/2013 No acute abnormality in the abdomen or pelvis. Small hiatal hernia with adjacent surgical changes suggesting prior hernia repair. Sludge and/or small stones layer within the gallbladder lumen. No secondary signs to suggest acute cholecystitis. Lower lumbar facet arthropathy.  Dg Chest Portable 1 View 09/20/2013 No active disease.  Antibiotics:  None  Code Status: Full  Family Communication: Pt at bedside  Disposition Plan: Home possibly in AM  HPI/Subjective: No events overnight.   Objective: Filed Vitals:   09/21/13 1649 09/21/13 1807 09/21/13 2044 09/22/13 0439  BP: 97/61 111/76 96/66 105/72  Pulse: 90 74 72 70  Temp: 98.2 F (36.8 C) 98.8 F (37.1 C) 98.8 F (37.1 C) 98 F (36.7 C)  TempSrc: Oral Oral Oral Oral  Resp: 19 18 18 18   Height:  5' (1.524 m)    Weight:  81.012 kg (178 lb 9.6 oz)  82.056 kg (180 lb 14.4 oz)  SpO2: 98% 98% 95% 94%    Intake/Output Summary (Last 24 hours) at 09/22/13 1425 Last data filed at 09/22/13 0647  Gross per 24 hour  Intake    222 ml  Output   1525 ml  Net  -1303 ml    Exam:   General:  Pt is alert, follows commands appropriately, not in acute distress  Cardiovascular: Regular rate and rhythm, S1/S2, no murmurs,  no rubs, no gallops  Respiratory: Clear to auscultation bilaterally, no wheezing, no crackles, no rhonchi  Abdomen: Soft, non tender, non distended, bowel sounds present, no guarding  Skin: papular rash on the left sided posterior thoracic area, T7 dermatome area, small blister some crusted and some red extending to the area under the left breast  Neuro: Grossly nonfocal  Data Reviewed: Basic Metabolic Panel:  Recent Labs Lab 09/20/13 0412 09/20/13 0548 09/20/13 0755 09/20/13 1426 09/21/13 0332  09/22/13 0530  NA 135*  --  140 139 137 135*  K 7.0* 5.1 4.6 4.7 4.9 4.8  CL 100  --  102 102 101 99  CO2  --   --  27 22 25 26   GLUCOSE 103*  --  214* 122* 104* 103*  BUN 20  --  14 14 15 19   CREATININE 1.30*  --  1.05 1.07 1.10 1.14*  CALCIUM  --   --  8.7 8.6 8.5 8.7   Liver Function Tests:  Recent Labs Lab 09/20/13 0755 09/20/13 1426  AST 16 16  ALT 13 14  ALKPHOS 83 81  BILITOT 0.4 0.5  PROT 7.0 7.0  ALBUMIN 3.2* 3.1*   No results found for this basename: LIPASE, AMYLASE,  in the last 168 hours No results found for this basename: AMMONIA,  in the last 168 hours CBC:  Recent Labs Lab 09/20/13 0402 09/20/13 0412 09/21/13 0332 09/22/13 0530  WBC 6.3  --  5.6 5.8  HGB 11.6* 11.9* 11.0* 11.1*  HCT 35.4* 35.0* 34.7* 35.4*  MCV 90.5  --  92.5 92.2  PLT 171  --  163 144*   Cardiac Enzymes:  Recent Labs Lab 09/20/13 1426 09/20/13 2022 09/21/13 0332  TROPONINI <0.30 <0.30 <0.30   BNP: No components found with this basename: POCBNP,  CBG:  Recent Labs Lab 09/20/13 0725 09/20/13 0818  GLUCAP 99 176*    Recent Results (from the past 240 hour(s))  MRSA PCR SCREENING     Status: Abnormal   Collection Time    09/20/13  5:15 PM      Result Value Ref Range Status   MRSA by PCR POSITIVE (*) NEGATIVE Final   Comment:            The GeneXpert MRSA Assay (FDA     approved for NASAL specimens     only), is one component of a     comprehensive MRSA colonization     surveillance program. It is not     intended to diagnose MRSA     infection nor to guide or     monitor treatment for     MRSA infections.     RESULT CALLED TO, READ BACK BY AND VERIFIED WITH:     A PETTIFORD RN 2049 09/20/13 A BROWNING     Scheduled Meds: . acyclovir  800 mg Oral 5 X Daily  . atorvastatin  10 mg Oral q1800  . calcium carbonate  3 tablet Oral BID WC  . Chlorhexidine Gluconate Cloth  6 each Topical Q0600  . cholecalciferol  2,000 Units Oral BID  . clopidogrel  75 mg Oral Q  breakfast  . enoxaparin (LOVENOX) injection  40 mg Subcutaneous Q24H  . FLUoxetine  20 mg Oral Daily  . gabapentin  100 mg Oral TID  . lisinopril  20 mg Oral QAC breakfast  . loratadine  10 mg Oral Daily  . mupirocin ointment  1 application Nasal BID  . pantoprazole  80 mg  Oral Daily   Continuous Infusions:    Theodis Blaze, MD  [facility] Pager [phone]  If 7PM-7AM, please contact night-coverage [email or web address] Password [email or web address] 09/22/2013, 2:25 PM   LOS: 2 days

## 2013-09-23 ENCOUNTER — Inpatient Hospital Stay (HOSPITAL_COMMUNITY): Payer: Medicare Other

## 2013-09-23 DIAGNOSIS — B029 Zoster without complications: Secondary | ICD-10-CM

## 2013-09-23 LAB — BASIC METABOLIC PANEL
BUN: 23 mg/dL (ref 6–23)
CHLORIDE: 96 meq/L (ref 96–112)
CO2: 23 meq/L (ref 19–32)
CREATININE: 1.1 mg/dL (ref 0.50–1.10)
Calcium: 9 mg/dL (ref 8.4–10.5)
GFR calc Af Amer: 58 mL/min — ABNORMAL LOW (ref >90)
GFR calc non Af Amer: 50 mL/min — ABNORMAL LOW (ref >90)
GLUCOSE: 120 mg/dL — AB (ref 70–99)
Potassium: 4.9 mEq/L (ref 3.7–5.3)
Sodium: 135 mEq/L — ABNORMAL LOW (ref 137–147)

## 2013-09-23 LAB — CBC
HEMATOCRIT: 35.1 % — AB (ref 36.0–46.0)
HEMOGLOBIN: 11.3 g/dL — AB (ref 12.0–15.0)
MCH: 29.5 pg (ref 26.0–34.0)
MCHC: 32.2 g/dL (ref 30.0–36.0)
MCV: 91.6 fL (ref 78.0–100.0)
Platelets: 163 10*3/uL (ref 150–400)
RBC: 3.83 MIL/uL — AB (ref 3.87–5.11)
RDW: 13.9 % (ref 11.5–15.5)
WBC: 6.1 10*3/uL (ref 4.0–10.5)

## 2013-09-23 MED ORDER — GI COCKTAIL ~~LOC~~
30.0000 mL | Freq: Three times a day (TID) | ORAL | Status: DC | PRN
  Administered 2013-09-24 – 2013-09-27 (×2): 30 mL via ORAL
  Filled 2013-09-23 (×3): qty 30

## 2013-09-23 MED ORDER — VALACYCLOVIR HCL 500 MG PO TABS
1000.0000 mg | ORAL_TABLET | Freq: Three times a day (TID) | ORAL | Status: DC
  Filled 2013-09-23 (×2): qty 2

## 2013-09-23 MED ORDER — VALACYCLOVIR HCL 500 MG PO TABS
1000.0000 mg | ORAL_TABLET | Freq: Three times a day (TID) | ORAL | Status: DC
  Administered 2013-09-23 – 2013-09-24 (×2): 1000 mg via ORAL
  Filled 2013-09-23 (×4): qty 2

## 2013-09-23 NOTE — Progress Notes (Signed)
IV team notified of need for new access.  Soyla Dryer, RN

## 2013-09-23 NOTE — Progress Notes (Signed)
Patient ID: Angela Bond, female   DOB: 07/23/1945, 68 y.o.   MRN: [id number]  [facility] PROGRESS NOTE  Angela Bond:[id number] DOB: 31-Oct-1945 DOA: 09/20/2013 PCP: Simona Huh, MD  Brief narrative:  68 y.o. year-old female with history of HTN, HLD, PAF during hiatal hernia surgery last year which resolved (not on a/c), acid reflux, arthritis who presents from home with chest pain. The patient was last at her baseline health until about a month ago. She had a fall on her right side which caused a scalp hematoma and left knee hematoma. After that time, she has been experiencing DOE with simple ambulation, associated with diaphoresis. She describes chest pain as pressure like, 8/10 in severity, intermittent, radiating to the left axilla and laft subscapular area.   In the emergency department, she was given morphine which helped with pain. Labs were notable for mild normocytic anemia, BMP was hemolyzed a followup potassium was within normal limits. Troponin was negative. EKG demonstrated normal sinus rhythm with PVCs and no ST segment abnormalities or T-wave inversions. Chest x-ray was unremarkable. She received a dose of nitroglycerin which dropped her blood pressure to the 33A systolic and made her feel weak and lightheaded. Her chest pressure was improved from 8/10 to 4/10.   Active Problems:  Chest pain  - unclear etiology and possibly just musculoskeletal vs actual neuropathic pain from shingles  - Lexiscan test negative  - CE's 3 negative, no events on telemetry and pt is hemodynamically stable this AM  Atrial fibrillation  - in sinus rhythm this AM, rate controoled  Acute renal failure  - likely pre renal, PO intake improved - Cr is now WNL Low back pain  - MRI lumbar spine pending  - I do think her pain is possibly coming from shingles as well but pt explains her left hip if bothering her as well  - pt is pointing to low back area and left thoracic area as the main  source of pain  Shingles  - dermatome level T6 or T7 - acyclovir per pharmacy  - ID consultation requested and assistance is appreciated  Hypertension  - stable this AM with SBP in 130's  GERD (gastroesophageal reflux disease)  - stable, continue PPI  Depression  - stable clinically  Hypercholesteremia  - continue statin   Consultants:  None Procedures/Studies:  Ct Abdomen Pelvis W Contrast 09/20/2013 No acute abnormality in the abdomen or pelvis. Small hiatal hernia with adjacent surgical changes suggesting prior hernia repair. Sludge and/or small stones layer within the gallbladder lumen. No secondary signs to suggest acute cholecystitis. Lower lumbar facet arthropathy.  Dg Chest Portable 1 View 09/20/2013 No active disease.  Antibiotics:  None  Code Status: Full  Family Communication: Pt at bedside  Disposition Plan: Remains inpatient    HPI/Subjective: No events overnight.   Objective: Filed Vitals:   09/22/13 1617 09/22/13 2232 09/23/13 0537 09/23/13 1014  BP: 101/53 128/77 125/67 112/64  Pulse: 83 95 90 102  Temp: 98.5 F (36.9 C) 98.7 F (37.1 C) 98.9 F (37.2 C) 99.3 F (37.4 C)  TempSrc: Oral Axillary Oral Oral  Resp: 18 17 16 18   Height:  5' (1.524 m)    Weight:  82.509 kg (181 lb 14.4 oz)    SpO2: 98% 99% 95% 96%    Intake/Output Summary (Last 24 hours) at 09/23/13 1224 Last data filed at 09/23/13 0900  Gross per 24 hour  Intake    480 ml  Output  0 ml  Net    480 ml    Exam:   General:  Pt is alert, follows commands appropriately, not in acute distress  Cardiovascular: Regular rate and rhythm, S1/S2, no murmurs, no rubs, no gallops  Respiratory: Clear to auscultation bilaterally, no wheezing, no crackles, no rhonchi  Abdomen: Soft, non tender, non distended, bowel sounds present, no guarding  Skin: papular rash on the left sided posterior thoracic area, T7 dermatome area, small blister some crusted and some red extending to the area under  the left breast  Neuro: Grossly nonfocal  Data Reviewed: Basic Metabolic Panel:  Recent Labs Lab 09/20/13 0755 09/20/13 1426 09/21/13 0332 09/22/13 0530 09/23/13 0540  NA 140 139 137 135* 135*  K 4.6 4.7 4.9 4.8 4.9  CL 102 102 101 99 96  CO2 27 22 25 26 23   GLUCOSE 214* 122* 104* 103* 120*  BUN 14 14 15 19 23   CREATININE 1.05 1.07 1.10 1.14* 1.10  CALCIUM 8.7 8.6 8.5 8.7 9.0   Liver Function Tests:  Recent Labs Lab 09/20/13 0755 09/20/13 1426  AST 16 16  ALT 13 14  ALKPHOS 83 81  BILITOT 0.4 0.5  PROT 7.0 7.0  ALBUMIN 3.2* 3.1*   CBC:  Recent Labs Lab 09/20/13 0402 09/20/13 0412 09/21/13 0332 09/22/13 0530 09/23/13 0540  WBC 6.3  --  5.6 5.8 6.1  HGB 11.6* 11.9* 11.0* 11.1* 11.3*  HCT 35.4* 35.0* 34.7* 35.4* 35.1*  MCV 90.5  --  92.5 92.2 91.6  PLT 171  --  163 144* 163   Cardiac Enzymes:  Recent Labs Lab 09/20/13 1426 09/20/13 2022 09/21/13 0332  TROPONINI <0.30 <0.30 <0.30   CBG:  Recent Labs Lab 09/20/13 0725 09/20/13 0818  GLUCAP 99 176*    Recent Results (from the past 240 hour(s))  MRSA PCR SCREENING     Status: Abnormal   Collection Time    09/20/13  5:15 PM      Result Value Ref Range Status   MRSA by PCR POSITIVE (*) NEGATIVE Final   Comment:            The GeneXpert MRSA Assay (FDA     approved for NASAL specimens     only), is one component of a     comprehensive MRSA colonization     surveillance program. It is not     intended to diagnose MRSA     infection nor to guide or     monitor treatment for     MRSA infections.     RESULT CALLED TO, READ BACK BY AND VERIFIED WITH:     A PETTIFORD RN 2049 09/20/13 A BROWNING     Scheduled Meds: . acyclovir  800 mg Oral 5 X Daily  . atorvastatin  10 mg Oral q1800  . calcium carbonate  3 tablet Oral BID WC  . Chlorhexidine Gluconate Cloth  6 each Topical Q0600  . cholecalciferol  2,000 Units Oral BID  . clopidogrel  75 mg Oral Q breakfast  . enoxaparin (LOVENOX) injection   40 mg Subcutaneous Q24H  . FLUoxetine  20 mg Oral Daily  . gabapentin  100 mg Oral TID  . lisinopril  20 mg Oral QAC breakfast  . loratadine  10 mg Oral Daily  . mupirocin ointment  1 application Nasal BID  . pantoprazole  80 mg Oral Daily   Continuous Infusions:  Theodis Blaze, MD  [facility] Pager [phone]  If 7PM-7AM, please contact night-coverage  [email or web address] Password [email or web address] 09/23/2013, 12:24 PM   LOS: 3 days

## 2013-09-23 NOTE — Consult Note (Addendum)
[facility]  Date of Admission:  09/20/2013  Date of Consult:  09/23/2013  Reason for Consult: Shingles Referring Physician: Doyle Askew  Impression/Recommendation Shingles  Change to valtrex 1 po tid Airborne and contact isolation until "dry and crusted" Watch her closely for superinfection  Comment- Evidence does not support use of steroids, neurontin, TCA to decrease risk of post-herpetic neuralgia.   Thank you so much for this interesting consult,   Campbell Riches (pager) [phone] [email or web address]  Angela Bond is an 68 y.o. female.  HPI: 68 yo F with hx of HTN, and fall onto her R ~ 1 month ago. She developed scalp and knee hematomas. She has since had CP, DOE. She came to hospital on 5-8. Pain was felt to be similar to GERD but not improved with PPI, tums. Pain improved in ED with morphine but did cause a marked decrease in BP. She has had a spect scan that was (-).  In hospital she was noted to have a L thoracic rash with crusting and blistering beginning 5-10. She was started on acyclovir yesterday.   Past Medical History  Diagnosis Date  . Arthritis   . Hypertension   . GERD (gastroesophageal reflux disease)   . Hypercholesteremia   . Heart murmur   . Anxiety and depression   . H/O hiatal hernia   . Shortness of breath     "stomach is in lungs"  . Headache(784.0)     sinus    Past Surgical History  Procedure Laterality Date  . Total knee arthroplasty    . Eye surgery  2013    tube placed Left  . Esophageal manometry N/A 07/02/2012    Procedure: ESOPHAGEAL MANOMETRY (EM);  Surgeon: Lear Ng, MD;  Location: [facility];  Service: Endoscopy;  Laterality: N/A;  schooler to read  . Joint replacement Bilateral 2011,2012    knees  . Breast surgery      r breast biopsy  . Tonsillectomy  as child  . Hiatal hernia repair N/A 08/10/2012    Procedure: LAPAROSCOPIC REPAIR OF HIATAL HERNIA WITH MESH AND EGD WITH PEG  TUBE PLACEMENT;  Surgeon: Ralene Ok, MD;  Location: [facility];  Service: General;  Laterality: N/A;     Allergies  Allergen Reactions  . Aleve [Naproxen]   . Aspirin     Burns stomach  . Ciprofloxacin Nausea And Vomiting  . Doxycycline Nausea And Vomiting  . Evista [Raloxifene]     Leg aches  . Fosamax [Alendronate Sodium]     Leg aches  . Pravachol [Pravastatin]     GI surgery  . Sulfa Antibiotics Nausea Only  . Welchol [Colesevelam Hcl]     Stomach cramps  . Zocor [Simvastatin] Rash    Medications:  Scheduled: . acyclovir  800 mg Oral 5 X Daily  . atorvastatin  10 mg Oral q1800  . calcium carbonate  3 tablet Oral BID WC  . Chlorhexidine Gluconate Cloth  6 each Topical Q0600  . cholecalciferol  2,000 Units Oral BID  . clopidogrel  75 mg Oral Q breakfast  . enoxaparin (LOVENOX) injection  40 mg Subcutaneous Q24H  . FLUoxetine  20 mg Oral Daily  . gabapentin  100 mg Oral TID  . lisinopril  20 mg Oral QAC breakfast  . loratadine  10 mg Oral Daily  . mupirocin ointment  1 application Nasal BID  . pantoprazole  80 mg Oral Daily    Abtx:  Anti-infectives  Start     Dose/Rate Route Frequency Ordered Stop   09/22/13 1400  acyclovir (ZOVIRAX) tablet 800 mg     800 mg Oral 5 times daily 09/22/13 1255        Total days of antibiotics 2 (acyclovir)          Social History:  reports that she has never smoked. She has never used smokeless tobacco. She reports that she does not drink alcohol or use illicit drugs.  Family History  Problem Relation Age of Onset  . Heart disease Mother     67s, CHF  . Heart disease Father     d/o MI at 55  . Cancer Father     skin  . Cancer Sister     breast  . Heart disease Sister     CABG in mid-60s    General ROS: no headahce, decreased appetite, normal BM, normal urination, had chicken pox as a child, see HPI.   Blood pressure 89/67, pulse 97, temperature 98.9 F (37.2 C), temperature source Oral, resp. rate 18, height 5'  (1.524 m), weight 82.509 kg (181 lb 14.4 oz), SpO2 100.00%. General appearance: alert, cooperative and no distress Eyes: negative findings: conjunctivae and sclerae normal and pupils equal, round, reactive to light and accomodation Throat: normal findings: oropharynx pink & moist without lesions or evidence of thrush Neck: no adenopathy and supple, symmetrical, trachea midline Lungs: clear to auscultation bilaterally Heart: regular rate and rhythm Abdomen: normal findings: bowel sounds normal and soft, non-tender Extremities: edema none Skin: vesicular rash on mid back from midline on L, extending to midline anteriorly below breast.    Results for orders placed during the hospital encounter of 09/20/13 (from the past 48 hour(s))  CBC     Status: Abnormal   Collection Time    09/22/13  5:30 AM      Result Value Ref Range   WBC 5.8  4.0 - 10.5 K/uL   RBC 3.84 (*) 3.87 - 5.11 MIL/uL   Hemoglobin 11.1 (*) 12.0 - 15.0 g/dL   HCT 35.4 (*) 36.0 - 46.0 %   MCV 92.2  78.0 - 100.0 fL   MCH 28.9  26.0 - 34.0 pg   MCHC 31.4  30.0 - 36.0 g/dL   RDW 13.8  11.5 - 15.5 %   Platelets 144 (*) 150 - 400 K/uL  BASIC METABOLIC PANEL     Status: Abnormal   Collection Time    09/22/13  5:30 AM      Result Value Ref Range   Sodium 135 (*) 137 - 147 mEq/L   Potassium 4.8  3.7 - 5.3 mEq/L   Chloride 99  96 - 112 mEq/L   CO2 26  19 - 32 mEq/L   Glucose, Bld 103 (*) 70 - 99 mg/dL   BUN 19  6 - 23 mg/dL   Creatinine, Ser 1.14 (*) 0.50 - 1.10 mg/dL   Calcium 8.7  8.4 - 10.5 mg/dL   GFR calc non Af Amer 48 (*) >90 mL/min   GFR calc Af Amer 56 (*) >90 mL/min   Comment: (NOTE)     The eGFR has been calculated using the CKD EPI equation.     This calculation has not been validated in all clinical situations.     eGFR's persistently <90 mL/min signify possible Chronic Kidney     Disease.  CBC     Status: Abnormal   Collection Time    09/23/13  5:40 AM  Result Value Ref Range   WBC 6.1  4.0 - 10.5  K/uL   RBC 3.83 (*) 3.87 - 5.11 MIL/uL   Hemoglobin 11.3 (*) 12.0 - 15.0 g/dL   HCT 35.1 (*) 36.0 - 46.0 %   MCV 91.6  78.0 - 100.0 fL   MCH 29.5  26.0 - 34.0 pg   MCHC 32.2  30.0 - 36.0 g/dL   RDW 13.9  11.5 - 15.5 %   Platelets 163  150 - 400 K/uL  BASIC METABOLIC PANEL     Status: Abnormal   Collection Time    09/23/13  5:40 AM      Result Value Ref Range   Sodium 135 (*) 137 - 147 mEq/L   Potassium 4.9  3.7 - 5.3 mEq/L   Chloride 96  96 - 112 mEq/L   CO2 23  19 - 32 mEq/L   Glucose, Bld 120 (*) 70 - 99 mg/dL   BUN 23  6 - 23 mg/dL   Creatinine, Ser 1.10  0.50 - 1.10 mg/dL   Calcium 9.0  8.4 - 10.5 mg/dL   GFR calc non Af Amer 50 (*) >90 mL/min   GFR calc Af Amer 58 (*) >90 mL/min   Comment: (NOTE)     The eGFR has been calculated using the CKD EPI equation.     This calculation has not been validated in all clinical situations.     eGFR's persistently <90 mL/min signify possible Chronic Kidney     Disease.      Component Value Date/Time   SDES URINE, CATHETERIZED 12/22/2010 0910   SPECREQUEST NONE 12/22/2010 0910   CULT NO GROWTH 12/22/2010 0910   REPTSTATUS 12/23/2010 FINAL 12/22/2010 0910   No results found. Recent Results (from the past 240 hour(s))  MRSA PCR SCREENING     Status: Abnormal   Collection Time    09/20/13  5:15 PM      Result Value Ref Range Status   MRSA by PCR POSITIVE (*) NEGATIVE Final   Comment:            The GeneXpert MRSA Assay (FDA     approved for NASAL specimens     only), is one component of a     comprehensive MRSA colonization     surveillance program. It is not     intended to diagnose MRSA     infection nor to guide or     monitor treatment for     MRSA infections.     RESULT CALLED TO, READ BACK BY AND VERIFIED WITH:     A PETTIFORD RN 2049 09/20/13 A BROWNING      09/23/2013, 2:13 PM     LOS: 3 days     **Disclaimer: This note may have been dictated with voice recognition software. Similar sounding words can inadvertently be  transcribed and this note may contain transcription errors which may not have been corrected upon publication of note.**

## 2013-09-24 LAB — BASIC METABOLIC PANEL
BUN: 24 mg/dL — AB (ref 6–23)
CALCIUM: 8.9 mg/dL (ref 8.4–10.5)
CO2: 27 meq/L (ref 19–32)
CREATININE: 1.35 mg/dL — AB (ref 0.50–1.10)
Chloride: 90 mEq/L — ABNORMAL LOW (ref 96–112)
GFR calc Af Amer: 46 mL/min — ABNORMAL LOW (ref >90)
GFR calc non Af Amer: 39 mL/min — ABNORMAL LOW (ref >90)
Glucose, Bld: 102 mg/dL — ABNORMAL HIGH (ref 70–99)
Potassium: 4.8 mEq/L (ref 3.7–5.3)
Sodium: 129 mEq/L — ABNORMAL LOW (ref 137–147)

## 2013-09-24 LAB — CBC
HEMATOCRIT: 34.3 % — AB (ref 36.0–46.0)
Hemoglobin: 11.2 g/dL — ABNORMAL LOW (ref 12.0–15.0)
MCH: 29.4 pg (ref 26.0–34.0)
MCHC: 32.7 g/dL (ref 30.0–36.0)
MCV: 90 fL (ref 78.0–100.0)
PLATELETS: 159 10*3/uL (ref 150–400)
RBC: 3.81 MIL/uL — AB (ref 3.87–5.11)
RDW: 14 % (ref 11.5–15.5)
WBC: 4.9 10*3/uL (ref 4.0–10.5)

## 2013-09-24 MED ORDER — VALACYCLOVIR HCL 500 MG PO TABS
1000.0000 mg | ORAL_TABLET | Freq: Two times a day (BID) | ORAL | Status: DC
  Administered 2013-09-24 – 2013-09-28 (×8): 1000 mg via ORAL
  Filled 2013-09-24 (×9): qty 2

## 2013-09-24 MED ORDER — SODIUM CHLORIDE 0.9 % IV SOLN
INTRAVENOUS | Status: AC
  Administered 2013-09-24: 16:00:00 via INTRAVENOUS

## 2013-09-24 NOTE — Progress Notes (Signed)
Patient ID: Angela Bond, female   DOB: [birth year], 68 y.o.   MRN: [id number]  [facility] PROGRESS NOTE  Angela Bond DJS:[id number] DOB: 1945-12-27 DOA: 09/20/2013 PCP: Simona Huh, MD  Brief narrative:  68 y.o. year-old female with history of HTN, HLD, PAF during hiatal hernia surgery last year which resolved (not on a/c), acid reflux, arthritis who presents from home with chest pain. The patient was last at her baseline health until about a month ago. She had a fall on her right side which caused a scalp hematoma and left knee hematoma. After that time, she has been experiencing DOE with simple ambulation, associated with diaphoresis. She describes chest pain as pressure like, 8/10 in severity, intermittent, radiating to the left axilla and laft subscapular area.   In the emergency department, she was given morphine which helped with pain. Labs were notable for mild normocytic anemia, BMP was hemolyzed a followup potassium was within normal limits. Troponin was negative. EKG demonstrated normal sinus rhythm with PVCs and no ST segment abnormalities or T-wave inversions. Chest x-ray was unremarkable. She received a dose of nitroglycerin which dropped her blood pressure to the 88F systolic and made her feel weak and lightheaded. Her chest pressure was improved from 8/10 to 4/10.   Active Problems:  Chest pain  - unclear etiology and possibly just musculoskeletal vs actual neuropathic pain from shingles  - Lexiscan test negative  - CE's 3 negative, no events on telemetry and pt is hemodynamically stable this AM  - discontinue telemetry  Atrial fibrillation  - in sinus rhythm this AM, rate controoled  Acute renal failure  - likely pre renal, PO intake improved but Cr is up over the past 24 hours - will provide gentle hydration today and repeat BMP in AM - if renal function improves, will plan on D/C in AM Low back pain  - MRI lumbar spine with chronic osteoarthritic changes, no  acute findings  - I do think her pain is possibly coming from shingles as well but pt explains her left hip if bothering her as well  - continue analgesia as needed  Shingles  - dermatome level T6 or T7  - appreciate ID input  Hypertension  - stable this AM with SBP in 130's  GERD (gastroesophageal reflux disease)  - stable, continue PPI  Depression  - stable clinically  Hypercholesteremia  - continue statin   Consultants:  ID Procedures/Studies:  Ct Abdomen Pelvis W Contrast 09/20/2013 No acute abnormality in the abdomen or pelvis. Small hiatal hernia with adjacent surgical changes suggesting prior hernia repair. Sludge and/or small stones layer within the gallbladder lumen. No secondary signs to suggest acute cholecystitis. Lower lumbar facet arthropathy.  Dg Chest Portable 1 View 09/20/2013 No active disease.  Antibiotics:  None  Code Status: Full  Family Communication: Pt at bedside  Disposition Plan: Remains inpatient and possibly home in AM  HPI/Subjective: No events overnight.   Objective: Filed Vitals:   09/23/13 1357 09/23/13 1826 09/23/13 2008 09/24/13 0450  BP: 89/67 98/58 106/66 112/60  Pulse: 97 85 102 80  Temp: 98.9 F (37.2 C) 99.5 F (37.5 C) 99.9 F (37.7 C) 98.7 F (37.1 C)  TempSrc: Oral Oral Oral Oral  Resp: 18 18 17 16   Height:      Weight:   81.421 kg (179 lb 8 oz)   SpO2: 100% 98% 98% 100%    Intake/Output Summary (Last 24 hours) at 09/24/13 1128 Last data filed at 09/24/13 0300  Gross per 24 hour  Intake    200 ml  Output    250 ml  Net    -50 ml    Exam:   General:  Pt is alert, follows commands appropriately, not in acute distress  Cardiovascular: Regular rate and rhythm, S1/S2, no murmurs, no rubs, no gallops  Respiratory: Clear to auscultation bilaterally, no wheezing, no crackles, no rhonchi  Abdomen: Soft, non tender, non distended, bowel sounds present, no guarding  Skin: papular rash on the left sided posterior thoracic  area, T7 dermatome area, small blister some crusted and some red extending to the area under the left breast  Neuro: Grossly nonfocal  Data Reviewed: Basic Metabolic Panel:  Recent Labs Lab 09/20/13 1426 09/21/13 0332 09/22/13 0530 09/23/13 0540 09/24/13 0555  NA 139 137 135* 135* 129*  K 4.7 4.9 4.8 4.9 4.8  CL 102 101 99 96 90*  CO2 22 25 26 23 27   GLUCOSE 122* 104* 103* 120* 102*  BUN 14 15 19 23  24*  CREATININE 1.07 1.10 1.14* 1.10 1.35*  CALCIUM 8.6 8.5 8.7 9.0 8.9   Liver Function Tests:  Recent Labs Lab 09/20/13 0755 09/20/13 1426  AST 16 16  ALT 13 14  ALKPHOS 83 81  BILITOT 0.4 0.5  PROT 7.0 7.0  ALBUMIN 3.2* 3.1*   No results found for this basename: LIPASE, AMYLASE,  in the last 168 hours No results found for this basename: AMMONIA,  in the last 168 hours CBC:  Recent Labs Lab 09/20/13 0402 09/20/13 0412 09/21/13 0332 09/22/13 0530 09/23/13 0540 09/24/13 0555  WBC 6.3  --  5.6 5.8 6.1 4.9  HGB 11.6* 11.9* 11.0* 11.1* 11.3* 11.2*  HCT 35.4* 35.0* 34.7* 35.4* 35.1* 34.3*  MCV 90.5  --  92.5 92.2 91.6 90.0  PLT 171  --  163 144* 163 159   Cardiac Enzymes:  Recent Labs Lab 09/20/13 1426 09/20/13 2022 09/21/13 0332  TROPONINI <0.30 <0.30 <0.30   CBG:  Recent Labs Lab 09/20/13 0725 09/20/13 0818  GLUCAP 99 176*    Recent Results (from the past 240 hour(s))  MRSA PCR SCREENING     Status: Abnormal   Collection Time    09/20/13  5:15 PM      Result Value Ref Range Status   MRSA by PCR POSITIVE (*) NEGATIVE Final   Comment:            The GeneXpert MRSA Assay (FDA     approved for NASAL specimens     only), is one component of a     comprehensive MRSA colonization     surveillance program. It is not     intended to diagnose MRSA     infection nor to guide or     monitor treatment for     MRSA infections.     RESULT CALLED TO, READ BACK BY AND VERIFIED WITH:     A PETTIFORD RN 2049 09/20/13 A BROWNING     Scheduled Meds: .  atorvastatin  10 mg Oral q1800  . calcium carbonate  3 tablet Oral BID WC  . Chlorhexidine Gluconate Cloth  6 each Topical Q0600  . cholecalciferol  2,000 Units Oral BID  . clopidogrel  75 mg Oral Q breakfast  . enoxaparin (LOVENOX) injection  40 mg Subcutaneous Q24H  . FLUoxetine  20 mg Oral Daily  . gabapentin  100 mg Oral TID  . lisinopril  20 mg Oral QAC breakfast  . loratadine  10 mg Oral Daily  . mupirocin ointment  1 application Nasal BID  . pantoprazole  80 mg Oral Daily  . valACYclovir  1,000 mg Oral TID   Continuous Infusions:   Theodis Blaze, MD  [facility] Pager [phone]  If 7PM-7AM, please contact night-coverage [email or web address] Password [email or web address] 09/24/2013, 11:28 AM   LOS: 4 days

## 2013-09-24 NOTE — Progress Notes (Signed)
Physical Therapy Treatment Patient Details Name: Angela Bond MRN: [id number] DOB: January 23, 1946 Today's Date: 09/24/2013    History of Present Illness The patient was last at their baseline health until about a month ago.  She had a fall on her right side which caused a scalp hematoma and left knee hematoma.  After that time, she has been experiencing DOE with simple ambulation with profuse swelling.  Six days PTA, she developed chest pressure which radiates from her left lower scapular area to the anterior left chest 8/10, constant, not associated with nausea, vomiting, diaphoresis, SOB worse than before. Workup to discern Cardiac CP or more musculaoskeletal or GI related; Shingles rash noted 5/10, now in Airborne Isolation room    PT Comments    Agreeable to working with PT and getting up and around; Requiring more assist today due to hip pain when getting up initially, which pt says happens from time to time;   Will update recs to include RW if pt does not already have one   Follow Up Recommendations  Outpatient PT     Equipment Recommendations  Rolling walker with 5" wheels (if pt does not already have)    Recommendations for Other Services       Precautions / Restrictions Precautions Precautions: Fall Precaution Comments: Discussed chair alarm with RN, who decided it is unecessary    Mobility  Bed Mobility Overal bed mobility: Modified Independent                Transfers Overall transfer level: Needs assistance Equipment used: 1 person hand held assist Transfers: Sit to/from Stand Sit to Stand: Min assist         General transfer comment: Handheld assist today as pt had trouble acheiving fully upright standing due to R hip pain; she reports this happens occasionally  Ambulation/Gait Ambulation/Gait assistance: Min assist Ambulation Distance (Feet): 25 Feet Assistive device: 1 person hand held assist Gait Pattern/deviations: Decreased stance time -  right Gait velocity: decr   General Gait Details: Right hip pain in stance leading to gait assymetry and dependence on UE support with walking   Stairs            Wheelchair Mobility    Modified Rankin (Stroke Patients Only)       Balance                                    Cognition Arousal/Alertness: Awake/alert Behavior During Therapy: WFL for tasks assessed/performed Overall Cognitive Status: Within Functional Limits for tasks assessed                      Exercises      General Comments        Pertinent Vitals/Pain Some pain, R hip, did not rate, but RN notified patient repositioned for comfort     Home Living                      Prior Function            PT Goals (current goals can now be found in the care plan section) Acute Rehab PT Goals Patient Stated Goal: Agreeable to amb Time For Goal Achievement: 09/29/13 (not dc'ing PT) Potential to Achieve Goals: Good Progress towards PT goals: Not progressing toward goals - comment (limited by hip pain)    Frequency  Min 3X/week  PT Plan Current plan remains appropriate;Equipment recommendations need to be updated    Co-evaluation             End of Session   Activity Tolerance: Patient tolerated treatment well Patient left: in chair;with call bell/phone within reach;Other (comment) (in camera room)     Time: 1950-9326 PT Time Calculation (min): 19 min  Charges:  $Gait Training: 8-22 mins                    G Codes:      [facility] [place] 09/24/2013, 11:34 AM Roney Marion, [place] Pager [phone] Office [phone]

## 2013-09-24 NOTE — Progress Notes (Signed)
INFECTIOUS DISEASE PROGRESS NOTE  ID: Angela Bond is Angela 68 y.o. female with  Active Problems:   Atrial fibrillation   Hypertension   GERD (gastroesophageal reflux disease)   Depression   Hypercholesteremia   Arthritis   Chest pressure   Hypotension due to drugs   Shingles  Subjective: C/o "freezing" on her arms No cough/sob C/o pain from rash  Abtx:  Anti-infectives   Start     Dose/Rate Route Frequency Ordered Stop   09/23/13 2200  valACYclovir (VALTREX) tablet 1,000 mg     1,000 mg Oral 3 times daily 09/23/13 1624     09/23/13 1630  valACYclovir (VALTREX) tablet 1,000 mg  Status:  Discontinued     1,000 mg Oral 3 times daily 09/23/13 1618 09/23/13 1624   09/22/13 1400  acyclovir (ZOVIRAX) tablet 800 mg  Status:  Discontinued     800 mg Oral 5 times daily 09/22/13 1255 09/23/13 1618      Medications:  Scheduled: . atorvastatin  10 mg Oral q1800  . calcium carbonate  3 tablet Oral BID WC  . Chlorhexidine Gluconate Cloth  6 each Topical Q0600  . cholecalciferol  2,000 Units Oral BID  . clopidogrel  75 mg Oral Q breakfast  . enoxaparin (LOVENOX) injection  40 mg Subcutaneous Q24H  . FLUoxetine  20 mg Oral Daily  . gabapentin  100 mg Oral TID  . lisinopril  20 mg Oral QAC breakfast  . loratadine  10 mg Oral Daily  . mupirocin ointment  1 application Nasal BID  . pantoprazole  80 mg Oral Daily  . valACYclovir  1,000 mg Oral TID    Objective: Vital signs in last 24 hours: Temp:  [98.7 F (37.1 C)-99.9 F (37.7 C)] 98.7 F (37.1 C) (05/12 1045) Pulse Rate:  [76-102] 76 (05/12 1045) Resp:  [16-18] 18 (05/12 1045) BP: (98-112)/(58-66) 108/66 mmHg (05/12 1045) SpO2:  [98 %-100 %] 98 % (05/12 1045) Weight:  [81.421 kg (179 lb 8 oz)] 81.421 kg (179 lb 8 oz) (05/11 2008)   General appearance: alert, cooperative and no distress Skin: vesicular rash on back and chest on L, does not cross mid-line. lesins are starting to crust. no new blisters.   Lab  Results  Recent Labs  09/23/13 0540 09/24/13 0555  WBC 6.1 4.9  HGB 11.3* 11.2*  HCT 35.1* 34.3*  NA 135* 129*  K 4.9 4.8  CL 96 90*  CO2 23 27  BUN 23 24*  CREATININE 1.10 1.35*   Liver Panel No results found for this basename: PROT, ALBUMIN, AST, ALT, ALKPHOS, BILITOT, BILIDIR, IBILI,  in the last 72 hours Sedimentation Rate No results found for this basename: ESRSEDRATE,  in the last 72 hours C-Reactive Protein No results found for this basename: CRP,  in the last 72 hours  Microbiology: Recent Results (from the past 240 hour(s))  MRSA PCR SCREENING     Status: Abnormal   Collection Time    09/20/13  5:15 PM      Result Value Ref Range Status   MRSA by PCR POSITIVE (*) NEGATIVE Final   Comment:            The GeneXpert MRSA Assay (FDA     approved for NASAL specimens     only), is one component of Angela     comprehensive MRSA colonization     surveillance program. It is not     intended to diagnose MRSA     infection  nor to guide or     monitor treatment for     MRSA infections.     RESULT CALLED TO, READ BACK BY AND VERIFIED WITH:     Angela Bond 2049 09/20/13 Angela Bond    Studies/Results: Mr Lumbar Spine Wo Contrast  09/23/2013   CLINICAL DATA:  Low back pain.  Recent fall.  EXAM: MRI LUMBAR SPINE WITHOUT CONTRAST  TECHNIQUE: Multiplanar, multisequence MR imaging of the lumbar spine was performed. No intravenous contrast was administered.  COMPARISON:  CT abdomen and pelvis 09/20/2013  FINDINGS: Vertebral alignment is normal. There is no evidence of compression fracture. Small Schmorl's nodes are noted in the lower thoracic and upper lumbar spine. Vertebral bone marrow signal is within normal limits. The conus medullaris is normal in signal and terminates at L1-2. Visualized paraspinal soft tissues are unremarkable.  L1-2 and L2-3:  Negative.  L3-4: Mild facet and ligamentum flavum hypertrophy without stenosis.  L4-5: Minimal disc bulge and advanced facet and  ligamentum flavum hypertrophy result in mild spinal canal narrowing. There is Angela moderate amount of fluid in the facet joints.  L5-S1: Severe bilateral facet hypertrophy with right greater than left facet joint fluid. No stenosis.  IMPRESSION: Advanced facet arthrosis at L4-5 and L5-S1. Mild spinal canal narrowing at L4-5. No evidence of neural impingement.   Electronically Signed   By: Angela Bond   On: 09/23/2013 15:50     Assessment/Plan: Shingles Increasing Cr  Total days of antivirals: 3 (valtrex)  Would change her valtrex to BID based on decreased CrCl.  Will need to watch her Cr as ACV can cause ARF and crystallize in kidneys Pain control         Angela Bond Infectious Diseases (pager) [phone] [email or web address] 09/24/2013, 2:39 PM  LOS: 4 days   **Disclaimer: This note may have been dictated with voice recognition software. Similar sounding words can inadvertently be transcribed and this note may contain transcription errors which may not have been corrected upon publication of note.**

## 2013-09-25 DIAGNOSIS — E871 Hypo-osmolality and hyponatremia: Secondary | ICD-10-CM

## 2013-09-25 LAB — BASIC METABOLIC PANEL
BUN: 27 mg/dL — ABNORMAL HIGH (ref 6–23)
CALCIUM: 8.4 mg/dL (ref 8.4–10.5)
CO2: 25 meq/L (ref 19–32)
Chloride: 88 mEq/L — ABNORMAL LOW (ref 96–112)
Creatinine, Ser: 1.2 mg/dL — ABNORMAL HIGH (ref 0.50–1.10)
GFR calc Af Amer: 53 mL/min — ABNORMAL LOW (ref >90)
GFR, EST NON AFRICAN AMERICAN: 45 mL/min — AB (ref >90)
GLUCOSE: 122 mg/dL — AB (ref 70–99)
Potassium: 4.8 mEq/L (ref 3.7–5.3)
SODIUM: 125 meq/L — AB (ref 137–147)

## 2013-09-25 LAB — CREATININE, URINE, RANDOM: Creatinine, Urine: 88.37 mg/dL

## 2013-09-25 LAB — OSMOLALITY: OSMOLALITY: 264 mosm/kg — AB (ref 275–300)

## 2013-09-25 LAB — OSMOLALITY, URINE: Osmolality, Ur: 340 mOsm/kg — ABNORMAL LOW (ref 390–1090)

## 2013-09-25 LAB — SODIUM, URINE, RANDOM: Sodium, Ur: 43 mEq/L

## 2013-09-25 MED ORDER — SODIUM CHLORIDE 0.9 % IV SOLN
INTRAVENOUS | Status: DC
  Administered 2013-09-25 – 2013-09-26 (×2): via INTRAVENOUS

## 2013-09-25 NOTE — Progress Notes (Signed)
INFECTIOUS DISEASE PROGRESS NOTE  ID: Angela Bond is a 68 y.o. female with  Active Problems:   Atrial fibrillation   Hypertension   GERD (gastroesophageal reflux disease)   Depression   Hypercholesteremia   Arthritis   Chest pressure   Hypotension due to drugs   Shingles  Subjective: Pain better  Abtx:  Anti-infectives   Start     Dose/Rate Route Frequency Ordered Stop   09/24/13 2200  valACYclovir (VALTREX) tablet 1,000 mg     1,000 mg Oral 2 times daily 09/24/13 1457     09/23/13 2200  valACYclovir (VALTREX) tablet 1,000 mg  Status:  Discontinued     1,000 mg Oral 3 times daily 09/23/13 1624 09/24/13 1457   09/23/13 1630  valACYclovir (VALTREX) tablet 1,000 mg  Status:  Discontinued     1,000 mg Oral 3 times daily 09/23/13 1618 09/23/13 1624   09/22/13 1400  acyclovir (ZOVIRAX) tablet 800 mg  Status:  Discontinued     800 mg Oral 5 times daily 09/22/13 1255 09/23/13 1618      Medications:  Scheduled: . atorvastatin  10 mg Oral q1800  . calcium carbonate  3 tablet Oral BID WC  . Chlorhexidine Gluconate Cloth  6 each Topical Q0600  . cholecalciferol  2,000 Units Oral BID  . clopidogrel  75 mg Oral Q breakfast  . enoxaparin (LOVENOX) injection  40 mg Subcutaneous Q24H  . FLUoxetine  20 mg Oral Daily  . gabapentin  100 mg Oral TID  . lisinopril  20 mg Oral QAC breakfast  . loratadine  10 mg Oral Daily  . mupirocin ointment  1 application Nasal BID  . pantoprazole  80 mg Oral Daily  . valACYclovir  1,000 mg Oral BID    Objective: Vital signs in last 24 hours: Temp:  [97.7 F (36.5 C)-98.7 F (37.1 C)] 97.7 F (36.5 C) (05/13 0520) Pulse Rate:  [75-102] 102 (05/13 0520) Resp:  [18] 18 (05/13 0520) BP: (108-135)/(64-84) 135/84 mmHg (05/13 0520) SpO2:  [96 %-99 %] 96 % (05/13 0520) Weight:  [81.25 kg (179 lb 2 oz)-172.7 kg (380 lb 11.8 oz)] 172.7 kg (380 lb 11.8 oz) (05/13 0500)   General appearance: alert, cooperative and no distress Skin: rash less  intense. few blisters midline chest, healing.   Lab Results  Recent Labs  09/23/13 0540 09/24/13 0555 09/25/13 0358  WBC 6.1 4.9  --   HGB 11.3* 11.2*  --   HCT 35.1* 34.3*  --   NA 135* 129* 125*  K 4.9 4.8 4.8  CL 96 90* 88*  CO2 23 27 25   BUN 23 24* 27*  CREATININE 1.10 1.35* 1.20*   Liver Panel No results found for this basename: PROT, ALBUMIN, AST, ALT, ALKPHOS, BILITOT, BILIDIR, IBILI,  in the last 72 hours Sedimentation Rate No results found for this basename: ESRSEDRATE,  in the last 72 hours C-Reactive Protein No results found for this basename: CRP,  in the last 72 hours  Microbiology: Recent Results (from the past 240 hour(s))  MRSA PCR SCREENING     Status: Abnormal   Collection Time    09/20/13  5:15 PM      Result Value Ref Range Status   MRSA by PCR POSITIVE (*) NEGATIVE Final   Comment:            The GeneXpert MRSA Assay (FDA     approved for NASAL specimens     only), is one component of a  comprehensive MRSA colonization     surveillance program. It is not     intended to diagnose MRSA     infection nor to guide or     monitor treatment for     MRSA infections.     RESULT CALLED TO, READ BACK BY AND VERIFIED WITH:     A PETTIFORD RN 2049 09/20/13 A BROWNING    Studies/Results: Mr Lumbar Spine Wo Contrast  09/23/2013   CLINICAL DATA:  Low back pain.  Recent fall.  EXAM: MRI LUMBAR SPINE WITHOUT CONTRAST  TECHNIQUE: Multiplanar, multisequence MR imaging of the lumbar spine was performed. No intravenous contrast was administered.  COMPARISON:  CT abdomen and pelvis 09/20/2013  FINDINGS: Vertebral alignment is normal. There is no evidence of compression fracture. Small Schmorl's nodes are noted in the lower thoracic and upper lumbar spine. Vertebral bone marrow signal is within normal limits. The conus medullaris is normal in signal and terminates at L1-2. Visualized paraspinal soft tissues are unremarkable.  L1-2 and L2-3:  Negative.  L3-4: Mild facet  and ligamentum flavum hypertrophy without stenosis.  L4-5: Minimal disc bulge and advanced facet and ligamentum flavum hypertrophy result in mild spinal canal narrowing. There is a moderate amount of fluid in the facet joints.  L5-S1: Severe bilateral facet hypertrophy with right greater than left facet joint fluid. No stenosis.  IMPRESSION: Advanced facet arthrosis at L4-5 and L5-S1. Mild spinal canal narrowing at L4-5. No evidence of neural impingement.   Electronically Signed   By: Logan Bores   On: 09/23/2013 15:50     Assessment/Plan: Zoster Increased Cr  Total days of antibiotics 4 valtrex      Cr better this AM Will cont Valtrex at current dose (CrCl still < 50).  Continue to watch her rash and Cr D/c planning     [facility] (pager) [phone] [email or web address] 09/25/2013, 8:45 AM  LOS: 5 days   **Disclaimer: This note may have been dictated with voice recognition software. Similar sounding words can inadvertently be transcribed and this note may contain transcription errors which may not have been corrected upon publication of note.**

## 2013-09-25 NOTE — Progress Notes (Signed)
PROGRESS NOTE  Angela Bond MWN:[id number] DOB: 1945-08-05 DOA: 09/20/2013 PCP: Simona Huh, MD  Assessment/Plan: Chest pain  - unclear etiology and possibly just musculoskeletal vs actual neuropathic pain from shingles  - Lexiscan test negative  - CE's 3 negative, no events on telemetry and pt is hemodynamically stable this AM  - discontinue telemetry  Hyponatremia -TSH 1.640 -May be related to fluoxetine--discontinue fluoxetine -Lisinopril and gabapentin may also have a role -Serum osmolarity, urine osmolarity -Urine creatinine, urine sodium -Restart IV fluids -Discontinue lisinopril as blood pressure is soft Atrial fibrillation  - in sinus rhythm this AM, rate controoled  Acute renal failure  - likely pre renal, PO intake improved but Cr is up over the past 24 hours  - will provide gentle hydration today and repeat BMP in AM  -baseline creatinine 0.7-0.9 Low back pain  - 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 Small hiatal hernia with adjacent surgical changes suggesting prior hernia repair. Sludge and/or small stones layer within the gallbladder lumen. No secondary signs to suggest acute cholecystitis. Lower lumbar facet arthropathy.  Dg Chest Portable 1 View 09/20/2013 No active disease.  Antibiotics:  None Code Status: Full  Family Communication: Pt at bedside  Disposition Plan: Remains  inpatient        Procedures/Studies: Mr Lumbar Spine Wo Contrast  09/23/2013   CLINICAL DATA:  Low back pain.  Recent fall.  EXAM: MRI LUMBAR SPINE WITHOUT CONTRAST  TECHNIQUE: Multiplanar, multisequence MR imaging of the lumbar spine was performed. No intravenous contrast was administered.  COMPARISON:  CT abdomen and pelvis 09/20/2013  FINDINGS: Vertebral alignment is normal. There is no evidence of compression fracture. Small Schmorl's nodes are noted in the lower thoracic and upper lumbar spine. Vertebral bone marrow signal is within normal limits. The conus medullaris is normal in signal and terminates at L1-2. Visualized paraspinal soft tissues are unremarkable.  L1-2 and L2-3:  Negative.  L3-4: Mild facet and ligamentum flavum hypertrophy without stenosis.  L4-5: Minimal disc bulge and advanced facet and ligamentum flavum hypertrophy result in mild spinal canal narrowing. There is a moderate amount of fluid in the facet joints.  L5-S1: Severe bilateral facet hypertrophy with right greater than left facet joint fluid. No stenosis.  IMPRESSION: Advanced facet arthrosis at L4-5 and L5-S1. Mild spinal canal narrowing at L4-5. No evidence of neural impingement.   Electronically Signed   By: Logan Bores   On: 09/23/2013 15:50   Ct Abdomen Pelvis W Contrast  09/20/2013   CLINICAL DATA:  Left upper quadrant pain with eating  EXAM: CT ABDOMEN AND PELVIS WITH CONTRAST  TECHNIQUE: Multidetector CT imaging of the abdomen and pelvis was performed using the standard protocol following bolus administration of intravenous contrast.  CONTRAST:  163mL OMNIPAQUE IOHEXOL 300 MG/ML  SOLN  COMPARISON:  Prior CT abdomen/pelvis 10/15/2012  FINDINGS: Lower Chest: The visualized lower lungs are clear. The heart is within normal  limits for size. No pericardial effusion. Atherosclerotic calcifications present within the visualized coronary arteries. There is a small hiatal hernia and surgical changes consistent with prior  hiatal hernia repair.  Abdomen: Will unremarkable CT appearance of the stomach, duodenum, adrenal glands and pancreas save for dystrophic calcification in the pancreatic tail likely follow up prior chronic pancreatitis. No acute inflammation. 6 mm circumscribed cystic lesion in a inferior pole the spleen is nonspecific but almost certainly benign. Normal hepatic contour and morphology. No discrete hepatic lesion. Layering high attenuation within the gallbladder is with sludge and/ small stones. No intra or extrahepatic biliary ductal dilatation.  Unremarkable appearance of the bilateral kidneys. No focal solid lesion, hydronephrosis or nephrolithiasis. No evidence of obstruction or focal bowel wall thickening. Normal appendix in the right lower quadrant. The terminal ileum is unremarkable.  Pelvis: Unremarkable bladder, uterus and adnexa. No free fluid or suspicious adenopathy.  Bones/Soft Tissues: No acute fracture or aggressive appearing lytic or blastic osseous lesion. Lower lumbar facet arthropathy No suspicious adenopathy.  Vascular: No significant atherosclerotic vascular disease, aneurysmal dilatation or acute abnormality.  IMPRESSION: 1. No acute abnormality in the abdomen or pelvis. 2. Small hiatal hernia with adjacent surgical changes suggesting prior hernia repair. 3. Sludge and/or small stones layer within the gallbladder lumen. No secondary signs to suggest acute cholecystitis. 4. Lower lumbar facet arthropathy.   Electronically Signed   By: Jacqulynn Cadet M.D.   On: 09/20/2013 17:05   Nm Myocar Multi W/spect W/wall Motion / Ef  09/21/2013   CLINICAL DATA:  Chest pain, atrial fibrillation.  EXAM: NUCLEAR MEDICINE MYOCARDIAL PERFUSION IMAGING  NUCLEAR MEDICINE LEFT VENTRICULAR WALL MOTION ANALYSIS  NUCLEAR MEDICINE LEFT VENTRICULAR EJECTION FRACTION CALCULATION  TECHNIQUE: Standard single day myocardial SPECT imaging was performed after resting intravenous injection of Tc-41m sestamibi. After  intravenous infusion of Lexiscan (regadenoson) under supervision of cardiology staff, sestamibiwas injected intravenously and standard myocardial SPECT imaging was performed. Quantitative gated imaging was also performed to evaluate left ventricular wall motion and estimate left ventricular ejection fraction.  Radiopharmaceutical: 10+30 mCi Tc75m sestamibiIV.  COMPARISON:  None  FINDINGS: The stress SPECT images demonstrate physiologic distribution of radiopharmaceutical. Rest images demonstrate no perfusion defects. The gated stress SPECT images demonstrate normal left ventricular myocardial thickening . No focal wall motion abnormality is seen. Calculated left ventricular end-diastolic volume 16XW, end-systolic volume 96EA, ejection fraction of 78%.  IMPRESSION: 1. Negative for pharmacologic-stress induced ischemia. 2. Left ventricular ejection fraction 78%.   Electronically Signed   By: Arne Cleveland M.D.   On: 09/21/2013 12:30   Dg Chest Portable 1 View  09/20/2013   CLINICAL DATA:  Chest pain  EXAM: PORTABLE CHEST - 1 VIEW  COMPARISON:  08/12/2012  FINDINGS: Generous heart size, without definitive cardiomegaly. Unremarkable upper mediastinal contours. No edema or consolidation. No effusion or pneumothorax.  IMPRESSION: No active disease.   Electronically Signed   By: Jorje Guild M.D.   On: 09/20/2013 04:22         Subjective: Patient complains of burning and pain around her left breast area wrapping to her left thoracic area. Denies fevers, chills, shortness of breath, nausea, vomiting, diarrhea, abdominal pain, dysuria, hematuria, hematochezia, melena.  Objective: Filed Vitals:   09/24/13 2100 09/25/13 0500 09/25/13 0520 09/25/13 0956  BP: 121/77  135/84 97/45  Pulse: 77  102 77  Temp: 98.5 F (36.9 C)  97.7 F (36.5 C) 98.5 F (36.9 C)  TempSrc: Oral  Oral Oral  Resp: 18  18 18  Height:      Weight:  172.7 kg (380 lb 11.8 oz)    SpO2: 99%  96% 95%    Intake/Output Summary  (Last 24 hours) at 09/25/13 1223 Last data filed at 09/25/13 1031  Gross per 24 hour  Intake    360 ml  Output      0 ml  Net    360 ml   Weight change: -0.171 kg (-6 oz) Exam:   General:  Pt is alert, follows commands appropriately, not in acute distress  HEENT: No icterus, No thrush,[place]/AT  Cardiovascular: RRR, S1/S2, no rubs, no gallops  Respiratory: CTA bilaterally, no wheezing, no crackles, no rhonchi; vesicular type rash underneath her left breast wrapping around to the left thoracic area, but does not cross the midline  Abdomen: Soft/+BS, non tender, non distended, no guarding  Extremities: trace edema, No lymphangitis, No petechiae, No rashes, no synovitis  Data Reviewed: Basic Metabolic Panel:  Recent Labs Lab 09/21/13 0332 09/22/13 0530 09/23/13 0540 09/24/13 0555 09/25/13 0358  NA 137 135* 135* 129* 125*  K 4.9 4.8 4.9 4.8 4.8  CL 101 99 96 90* 88*  CO2 25 26 23 27 25   GLUCOSE 104* 103* 120* 102* 122*  BUN 15 19 23  24* 27*  CREATININE 1.10 1.14* 1.10 1.35* 1.20*  CALCIUM 8.5 8.7 9.0 8.9 8.4   Liver Function Tests:  Recent Labs Lab 09/20/13 0755 09/20/13 1426  AST 16 16  ALT 13 14  ALKPHOS 83 81  BILITOT 0.4 0.5  PROT 7.0 7.0  ALBUMIN 3.2* 3.1*   No results found for this basename: LIPASE, AMYLASE,  in the last 168 hours No results found for this basename: AMMONIA,  in the last 168 hours CBC:  Recent Labs Lab 09/20/13 0402 09/20/13 0412 09/21/13 0332 09/22/13 0530 09/23/13 0540 09/24/13 0555  WBC 6.3  --  5.6 5.8 6.1 4.9  HGB 11.6* 11.9* 11.0* 11.1* 11.3* 11.2*  HCT 35.4* 35.0* 34.7* 35.4* 35.1* 34.3*  MCV 90.5  --  92.5 92.2 91.6 90.0  PLT 171  --  163 144* 163 159   Cardiac Enzymes:  Recent Labs Lab 09/20/13 1426 09/20/13 2022 09/21/13 0332  TROPONINI <0.30 <0.30 <0.30   BNP: No components found with this basename: POCBNP,  CBG:  Recent Labs Lab 09/20/13 0725 09/20/13 0818  GLUCAP 99 176*    Recent Results (from  the past 240 hour(s))  MRSA PCR SCREENING     Status: Abnormal   Collection Time    09/20/13  5:15 PM      Result Value Ref Range Status   MRSA by PCR POSITIVE (*) NEGATIVE Final   Comment:            The GeneXpert MRSA Assay (FDA     approved for NASAL specimens     only), is one component of a     comprehensive MRSA colonization     surveillance program. It is not     intended to diagnose MRSA     infection nor to guide or     monitor treatment for     MRSA infections.     RESULT CALLED TO, READ BACK BY AND VERIFIED WITH:     A PETTIFORD RN 2049 09/20/13 A BROWNING     Scheduled Meds: . atorvastatin  10 mg Oral q1800  . calcium carbonate  3 tablet Oral BID WC  . Chlorhexidine Gluconate Cloth  6 each Topical Q0600  . cholecalciferol  2,000  Units Oral BID  . clopidogrel  75 mg Oral Q breakfast  . enoxaparin (LOVENOX) injection  40 mg Subcutaneous Q24H  . FLUoxetine  20 mg Oral Daily  . gabapentin  100 mg Oral TID  . loratadine  10 mg Oral Daily  . mupirocin ointment  1 application Nasal BID  . pantoprazole  80 mg Oral Daily  . valACYclovir  1,000 mg Oral BID   Continuous Infusions:    Orson Eva, DO  [facility] Pager [phone]  If 7PM-7AM, please contact night-coverage [email or web address] Password [email or web address] 09/25/2013, 12:23 PM   LOS: 5 days

## 2013-09-26 DIAGNOSIS — E875 Hyperkalemia: Secondary | ICD-10-CM

## 2013-09-26 LAB — BASIC METABOLIC PANEL
BUN: 28 mg/dL — ABNORMAL HIGH (ref 6–23)
CO2: 25 meq/L (ref 19–32)
Calcium: 8.6 mg/dL (ref 8.4–10.5)
Chloride: 95 mEq/L — ABNORMAL LOW (ref 96–112)
Creatinine, Ser: 1.26 mg/dL — ABNORMAL HIGH (ref 0.50–1.10)
GFR calc Af Amer: 50 mL/min — ABNORMAL LOW (ref >90)
GFR, EST NON AFRICAN AMERICAN: 43 mL/min — AB (ref >90)
GLUCOSE: 90 mg/dL (ref 70–99)
POTASSIUM: 5.8 meq/L — AB (ref 3.7–5.3)
Sodium: 130 mEq/L — ABNORMAL LOW (ref 137–147)

## 2013-09-26 MED ORDER — SODIUM POLYSTYRENE SULFONATE 15 GM/60ML PO SUSP
15.0000 g | Freq: Once | ORAL | Status: AC
  Administered 2013-09-26: 15 g via ORAL
  Filled 2013-09-26: qty 60

## 2013-09-26 NOTE — Progress Notes (Signed)
PROGRESS NOTE  Angela Bond GBT:[id number] DOB: Nov 10, 1945 DOA: 09/20/2013 PCP: Simona Huh, MD  Assessment/Plan: Chest pain  - unclear etiology and possibly just musculoskeletal vs actual neuropathic pain from shingles  - Lexiscan test negative  - CE's 3 negative, no events on telemetry and pt is hemodynamically stable this AM  - discontinue telemetry  Hyponatremia  -TSH 1.640  -May be related to fluoxetine--discontinue fluoxetine  -Lisinopril and gabapentin may also have a role  -Serum osmolarity--264, urine osmolarity--340  -Urine creatinine, urine sodium--FeNa<1% -Restart IV fluids  -Discontinue lisinopril as blood pressure is soft  Hyperkalemia -d/c lisinopril -kayexalate--pt also c/o constipation Atrial fibrillation  - in sinus rhythm this AM, rate controoled  Acute renal failure  - will provide gentle hydration today and repeat BMP in AM  -baseline creatinine 0.7-0.9  -?new baseline  1.0-1.3 Low back pain  - 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 Small hiatal hernia with adjacent surgical changes suggesting prior hernia repair. Sludge and/or small stones layer within the gallbladder lumen. No secondary signs to suggest acute cholecystitis. Lower lumbar facet arthropathy.  Dg Chest Portable 1 View 09/20/2013 No active disease.  Antibiotics:  None      Procedures/Studies: Mr  Lumbar Spine Wo Contrast  09/23/2013   CLINICAL DATA:  Low back pain.  Recent fall.  EXAM: MRI LUMBAR SPINE WITHOUT CONTRAST  TECHNIQUE: Multiplanar, multisequence MR imaging of the lumbar spine was performed. No intravenous contrast was administered.  COMPARISON:  CT abdomen and pelvis 09/20/2013  FINDINGS: Vertebral alignment is normal. There is no evidence of compression fracture. Small Schmorl's nodes are noted in the lower thoracic and upper lumbar spine. Vertebral bone marrow signal is within normal limits. The conus medullaris is normal in signal and terminates at L1-2. Visualized paraspinal soft tissues are unremarkable.  L1-2 and L2-3:  Negative.  L3-4: Mild facet and ligamentum flavum hypertrophy without stenosis.  L4-5: Minimal disc bulge and advanced facet and ligamentum flavum hypertrophy result in mild spinal canal narrowing. There is a moderate amount of fluid in the facet joints.  L5-S1: Severe bilateral facet hypertrophy with right greater than left facet joint fluid. No stenosis.  IMPRESSION: Advanced facet arthrosis at L4-5 and L5-S1. Mild spinal canal narrowing at L4-5. No evidence of neural impingement.   Electronically Signed   By: Logan Bores   On: 09/23/2013 15:50   Ct Abdomen Pelvis W Contrast  09/20/2013   CLINICAL DATA:  Left upper quadrant pain with eating  EXAM: CT ABDOMEN AND PELVIS WITH CONTRAST  TECHNIQUE: Multidetector CT imaging of the abdomen and pelvis was performed using the standard protocol following bolus administration of intravenous contrast.  CONTRAST:  150mL OMNIPAQUE IOHEXOL 300 MG/ML  SOLN  COMPARISON:  Prior CT abdomen/pelvis 10/15/2012  FINDINGS: Lower Chest: The visualized lower lungs are clear. The heart is within normal limits for size. No pericardial effusion. Atherosclerotic calcifications present within  the visualized coronary arteries. There is a small hiatal hernia and surgical changes consistent with prior hiatal hernia repair.  Abdomen: Will unremarkable CT  appearance of the stomach, duodenum, adrenal glands and pancreas save for dystrophic calcification in the pancreatic tail likely follow up prior chronic pancreatitis. No acute inflammation. 6 mm circumscribed cystic lesion in a inferior pole the spleen is nonspecific but almost certainly benign. Normal hepatic contour and morphology. No discrete hepatic lesion. Layering high attenuation within the gallbladder is with sludge and/ small stones. No intra or extrahepatic biliary ductal dilatation.  Unremarkable appearance of the bilateral kidneys. No focal solid lesion, hydronephrosis or nephrolithiasis. No evidence of obstruction or focal bowel wall thickening. Normal appendix in the right lower quadrant. The terminal ileum is unremarkable.  Pelvis: Unremarkable bladder, uterus and adnexa. No free fluid or suspicious adenopathy.  Bones/Soft Tissues: No acute fracture or aggressive appearing lytic or blastic osseous lesion. Lower lumbar facet arthropathy No suspicious adenopathy.  Vascular: No significant atherosclerotic vascular disease, aneurysmal dilatation or acute abnormality.  IMPRESSION: 1. No acute abnormality in the abdomen or pelvis. 2. Small hiatal hernia with adjacent surgical changes suggesting prior hernia repair. 3. Sludge and/or small stones layer within the gallbladder lumen. No secondary signs to suggest acute cholecystitis. 4. Lower lumbar facet arthropathy.   Electronically Signed   By: Jacqulynn Cadet M.D.   On: 09/20/2013 17:05   Nm Myocar Multi W/spect W/wall Motion / Ef  09/21/2013   CLINICAL DATA:  Chest pain, atrial fibrillation.  EXAM: NUCLEAR MEDICINE MYOCARDIAL PERFUSION IMAGING  NUCLEAR MEDICINE LEFT VENTRICULAR WALL MOTION ANALYSIS  NUCLEAR MEDICINE LEFT VENTRICULAR EJECTION FRACTION CALCULATION  TECHNIQUE: Standard single day myocardial SPECT imaging was performed after resting intravenous injection of Tc-26m sestamibi. After intravenous infusion of Lexiscan (regadenoson) under  supervision of cardiology staff, sestamibiwas injected intravenously and standard myocardial SPECT imaging was performed. Quantitative gated imaging was also performed to evaluate left ventricular wall motion and estimate left ventricular ejection fraction.  Radiopharmaceutical: 10+30 mCi Tc77m sestamibiIV.  COMPARISON:  None  FINDINGS: The stress SPECT images demonstrate physiologic distribution of radiopharmaceutical. Rest images demonstrate no perfusion defects. The gated stress SPECT images demonstrate normal left ventricular myocardial thickening . No focal wall motion abnormality is seen. Calculated left ventricular end-diastolic volume 09BD, end-systolic volume 53GD, ejection fraction of 78%.  IMPRESSION: 1. Negative for pharmacologic-stress induced ischemia. 2. Left ventricular ejection fraction 78%.   Electronically Signed   By: Arne Cleveland M.D.   On: 09/21/2013 12:30   Dg Chest Portable 1 View  09/20/2013   CLINICAL DATA:  Chest pain  EXAM: PORTABLE CHEST - 1 VIEW  COMPARISON:  08/12/2012  FINDINGS: Generous heart size, without definitive cardiomegaly. Unremarkable upper mediastinal contours. No edema or consolidation. No effusion or pneumothorax.  IMPRESSION: No active disease.   Electronically Signed   By: Jorje Guild M.D.   On: 09/20/2013 04:22         Subjective: Patient complains of chest wall pain. Denies fevers, chills, nausea, vomiting, diarrhea, abdominal pain. She feels constipated. Denies any headaches or visual disturbance.  Objective: Filed Vitals:   09/25/13 0956 09/25/13 1821 09/25/13 2131 09/26/13 0900  BP: 97/45 96/53 93/41  101/55  Pulse: 77 77 76 81  Temp: 98.5 F (36.9 C) 98 F (36.7 C) 98.7 F (37.1 C) 97.4 F (36.3 C)  TempSrc: Oral Oral Oral Oral  Resp: 18 18 18 18   Height:      Weight:   84.4 kg (186 lb 1.1 oz)  SpO2: 95% 97% 96% 95%    Intake/Output Summary (Last 24 hours) at 09/26/13 1117 Last data filed at 09/26/13 0830  Gross per 24 hour    Intake    420 ml  Output   1450 ml  Net  -1030 ml   Weight change: 3.15 kg (6 lb 15.1 oz) Exam:   General:  Pt is alert, follows commands appropriately, not in acute distress  HEENT: No icterus, No thrush, No neck mass, [place]/AT  Cardiovascular: RRR, S1/S2, no rubs, no gallops; vesicular type rash extending from the left breast area to left thoracic paraspinal area  Respiratory: CTA bilaterally, no wheezing, no crackles, no rhonchi  Abdomen: Soft/+BS, non tender, non distended, no guarding  Extremities: No edema, No lymphangitis, No petechiae, No rashes, no synovitis  Data Reviewed: Basic Metabolic Panel:  Recent Labs Lab 09/22/13 0530 09/23/13 0540 09/24/13 0555 09/25/13 0358 09/26/13 0551  NA 135* 135* 129* 125* 130*  K 4.8 4.9 4.8 4.8 5.8*  CL 99 96 90* 88* 95*  CO2 26 23 27 25 25   GLUCOSE 103* 120* 102* 122* 90  BUN 19 23 24* 27* 28*  CREATININE 1.14* 1.10 1.35* 1.20* 1.26*  CALCIUM 8.7 9.0 8.9 8.4 8.6   Liver Function Tests:  Recent Labs Lab 09/20/13 0755 09/20/13 1426  AST 16 16  ALT 13 14  ALKPHOS 83 81  BILITOT 0.4 0.5  PROT 7.0 7.0  ALBUMIN 3.2* 3.1*   No results found for this basename: LIPASE, AMYLASE,  in the last 168 hours No results found for this basename: AMMONIA,  in the last 168 hours CBC:  Recent Labs Lab 09/20/13 0402 09/20/13 0412 09/21/13 0332 09/22/13 0530 09/23/13 0540 09/24/13 0555  WBC 6.3  --  5.6 5.8 6.1 4.9  HGB 11.6* 11.9* 11.0* 11.1* 11.3* 11.2*  HCT 35.4* 35.0* 34.7* 35.4* 35.1* 34.3*  MCV 90.5  --  92.5 92.2 91.6 90.0  PLT 171  --  163 144* 163 159   Cardiac Enzymes:  Recent Labs Lab 09/20/13 1426 09/20/13 2022 09/21/13 0332  TROPONINI <0.30 <0.30 <0.30   BNP: No components found with this basename: POCBNP,  CBG:  Recent Labs Lab 09/20/13 0725 09/20/13 0818  GLUCAP 99 176*    Recent Results (from the past 240 hour(s))  MRSA PCR SCREENING     Status: Abnormal   Collection Time    09/20/13  5:15  PM      Result Value Ref Range Status   MRSA by PCR POSITIVE (*) NEGATIVE Final   Comment:            The GeneXpert MRSA Assay (FDA     approved for NASAL specimens     only), is one component of a     comprehensive MRSA colonization     surveillance program. It is not     intended to diagnose MRSA     infection nor to guide or     monitor treatment for     MRSA infections.     RESULT CALLED TO, READ BACK BY AND VERIFIED WITH:     A PETTIFORD RN 2049 09/20/13 A BROWNING     Scheduled Meds: . atorvastatin  10 mg Oral q1800  . calcium carbonate  3 tablet Oral BID WC  . cholecalciferol  2,000 Units Oral BID  . clopidogrel  75 mg Oral Q breakfast  . enoxaparin (LOVENOX) injection  40 mg Subcutaneous Q24H  . loratadine  10 mg Oral Daily  .  pantoprazole  80 mg Oral Daily  . valACYclovir  1,000 mg Oral BID   Continuous Infusions: . sodium chloride 75 mL/hr at 09/25/13 1454     Orson Eva, DO  [facility] Pager [phone]  If 7PM-7AM, please contact night-coverage [email or web address] Password [email or web address] 09/26/2013, 11:17 AM   LOS: 6 days

## 2013-09-26 NOTE — Progress Notes (Signed)
Physical Therapy Treatment Patient Details Name: Angela Bond MRN: [id number] DOB: [birth year] Today's Date: 09/26/2013    History of Present Illness The patient was last at their baseline health until about a month ago.  She had a fall on her right side which caused a scalp hematoma and left knee hematoma.  After that time, she has been experiencing DOE with simple ambulation with profuse swelling.  Six days PTA, she developed chest pressure which radiates from her left lower scapular area to the anterior left chest 8/10, constant, not associated with nausea, vomiting, diaphoresis, SOB worse than before. Workup to discern Cardiac CP or more musculaoskeletal or GI related; Shingles rash noted 5/10, now in Airborne Isolation room    PT Comments    Pt. With no c/p chest pains and was resting comfortably in bed prior to PT session. She was able to ambulate 4 "laps" in her room and then to the bathroom with improving steadiness today.  If she continues to progress , she will likely not need  OPPT or RW.  Will adjust recommendations accordingly  Follow Up Recommendations  Outpatient PT (pt. progressing well and may not need OPPT at DC)     Equipment Recommendations  Other (comment) (pt. may not need RW at time of DC due to improvements today)    Recommendations for Other Services       Precautions / Restrictions Precautions Precautions: Fall Restrictions Weight Bearing Restrictions: No    Mobility  Bed Mobility Overal bed mobility: Modified Independent             General bed mobility comments: managing on her own  Transfers Overall transfer level: Modified independent Equipment used: None Transfers: Sit to/from Stand Sit to Stand: Modified independent (Device/Increase time)         General transfer comment: mod I level with good safety and no apparent LOB from bed and from toilet  Ambulation/Gait Ambulation/Gait assistance: Supervision Ambulation Distance (Feet): 60  Feet Assistive device: None Gait Pattern/deviations: Step-through pattern Gait velocity: decr   General Gait Details: Pt. with no complaint of hip pain today.  Noted to walk steady with no apparent LOB in small confines of her room (on airborne precautions).  Therapist managed the IV pole for pt. , though I anticipate she can do it on her own.   Stairs            Wheelchair Mobility    Modified Rankin (Stroke Patients Only)       Balance                                    Cognition Arousal/Alertness: Awake/alert Behavior During Therapy: WFL for tasks assessed/performed Overall Cognitive Status: Within Functional Limits for tasks assessed                      Exercises      General Comments        Pertinent Vitals/Pain See vitals tab No pain initially reported at rest, however pt. Requested pain med once back to bed due to pain in shingles area.  Rn was notified.    Home Living                      Prior Function            PT Goals (current goals can now be found in the care  plan section) Progress towards PT goals: Progressing toward goals    Frequency  Min 3X/week    PT Plan Current plan remains appropriate    Co-evaluation             End of Session   Activity Tolerance: Patient tolerated treatment well Patient left: in bed;with call bell/phone within reach;with family/visitor present     Time: 6734-1937 PT Time Calculation (min): 23 min  Charges:  $Gait Training: 23-37 mins                    G Codes:      Ladona Ridgel 09/26/2013, 4:27 PM Gerlean Ren PT [facility] [phone]

## 2013-09-26 NOTE — Progress Notes (Signed)
INFECTIOUS DISEASE PROGRESS NOTE  ID: Angela Bond is a 68 y.o. female with  Active Problems:   Atrial fibrillation   Hypertension   GERD (gastroesophageal reflux disease)   Depression   Hypercholesteremia   Arthritis   Chest pressure   Hypotension due to drugs   Shingles   Hyponatremia   Hyperkalemia  Subjective: C/o pain  Abtx:  Anti-infectives   Start     Dose/Rate Route Frequency Ordered Stop   09/24/13 2200  valACYclovir (VALTREX) tablet 1,000 mg     1,000 mg Oral 2 times daily 09/24/13 1457     09/23/13 2200  valACYclovir (VALTREX) tablet 1,000 mg  Status:  Discontinued     1,000 mg Oral 3 times daily 09/23/13 1624 09/24/13 1457   09/23/13 1630  valACYclovir (VALTREX) tablet 1,000 mg  Status:  Discontinued     1,000 mg Oral 3 times daily 09/23/13 1618 09/23/13 1624   09/22/13 1400  acyclovir (ZOVIRAX) tablet 800 mg  Status:  Discontinued     800 mg Oral 5 times daily 09/22/13 1255 09/23/13 1618      Medications:  Scheduled: . atorvastatin  10 mg Oral q1800  . calcium carbonate  3 tablet Oral BID WC  . cholecalciferol  2,000 Units Oral BID  . clopidogrel  75 mg Oral Q breakfast  . enoxaparin (LOVENOX) injection  40 mg Subcutaneous Q24H  . loratadine  10 mg Oral Daily  . pantoprazole  80 mg Oral Daily  . sodium polystyrene  15 g Oral Once  . valACYclovir  1,000 mg Oral BID    Objective: Vital signs in last 24 hours: Temp:  [97.4 F (36.3 C)-98.7 F (37.1 C)] 97.4 F (36.3 C) (05/14 0900) Pulse Rate:  [76-81] 81 (05/14 0900) Resp:  [18] 18 (05/14 0900) BP: (93-101)/(41-55) 101/55 mmHg (05/14 0900) SpO2:  [95 %-97 %] 95 % (05/14 0900) Weight:  [84.4 kg (186 lb 1.1 oz)] 84.4 kg (186 lb 1.1 oz) (05/13 2131)   General appearance: alert, cooperative and no distress Skin: herpetic rash on L chest is clean, few areas of skin breakdown at blister sites.   Lab Results  Recent Labs  09/24/13 0555 09/25/13 0358 09/26/13 0551  WBC 4.9  --   --   HGB  11.2*  --   --   HCT 34.3*  --   --   NA 129* 125* 130*  K 4.8 4.8 5.8*  CL 90* 88* 95*  CO2 27 25 25   BUN 24* 27* 28*  CREATININE 1.35* 1.20* 1.26*   Liver Panel No results found for this basename: PROT, ALBUMIN, AST, ALT, ALKPHOS, BILITOT, BILIDIR, IBILI,  in the last 72 hours Sedimentation Rate No results found for this basename: ESRSEDRATE,  in the last 72 hours C-Reactive Protein No results found for this basename: CRP,  in the last 72 hours  Microbiology: Recent Results (from the past 240 hour(s))  MRSA PCR SCREENING     Status: Abnormal   Collection Time    09/20/13  5:15 PM      Result Value Ref Range Status   MRSA by PCR POSITIVE (*) NEGATIVE Final   Comment:            The GeneXpert MRSA Assay (FDA     approved for NASAL specimens     only), is one component of a     comprehensive MRSA colonization     surveillance program. It is not     intended  to diagnose MRSA     infection nor to guide or     monitor treatment for     MRSA infections.     RESULT CALLED TO, READ BACK BY AND VERIFIED WITH:     A PETTIFORD RN 2049 09/20/13 A BROWNING    Studies/Results: No results found.   Assessment/Plan: Zoster Increased Cr  Total days of antibiotics 5 of 14 Valtrex  Would continue her valtrex Her Cr has increased slightly today. Mild breakdown of her blister sites.  Will need close wound f/u as well as close f/u of her Cr Consider trial of neurontin or tricyclic for her neuropathic pain.  Available if questions         [facility] (pager) [phone] [email or web address] 09/26/2013, 4:40 PM  LOS: 6 days   **Disclaimer: This note may have been dictated with voice recognition software. Similar sounding words can inadvertently be transcribed and this note may contain transcription errors which may not have been corrected upon publication of note.**

## 2013-09-27 ENCOUNTER — Inpatient Hospital Stay (HOSPITAL_COMMUNITY): Payer: Medicare Other

## 2013-09-27 LAB — BASIC METABOLIC PANEL
BUN: 20 mg/dL (ref 6–23)
CHLORIDE: 95 meq/L — AB (ref 96–112)
CO2: 25 mEq/L (ref 19–32)
Calcium: 8.5 mg/dL (ref 8.4–10.5)
Creatinine, Ser: 0.97 mg/dL (ref 0.50–1.10)
GFR calc non Af Amer: 59 mL/min — ABNORMAL LOW (ref >90)
GFR, EST AFRICAN AMERICAN: 68 mL/min — AB (ref >90)
Glucose, Bld: 90 mg/dL (ref 70–99)
Potassium: 5 mEq/L (ref 3.7–5.3)
Sodium: 128 mEq/L — ABNORMAL LOW (ref 137–147)

## 2013-09-27 NOTE — Care Management Note (Signed)
CARE MANAGEMENT NOTE 09/27/2013  Patient:  Angela Bond, Angela Bond   Account Number:  [id number]  Date Initiated:  09/27/2013  Documentation initiated by:  Lelania Bia  Subjective/Objective Assessment:     Action/Plan:   CM following for progression and d/c planning.  09/27/13 No HH needs identified at this time.   Anticipated DC Date:  09/28/2013   Anticipated DC Plan:  HOME/SELF CARE         Choice offered to / List presented to:             Status of service:  In process, will continue to follow Medicare Important Message given?  YES (If response is "NO", the following Medicare IM given date fields will be blank) Date Medicare IM given:  09/27/2013 Date Additional Medicare IM given:    Discharge Disposition:    Per UR Regulation:    If discussed at Long Length of Stay Meetings, dates discussed:    Comments:

## 2013-09-27 NOTE — Progress Notes (Signed)
INFECTIOUS DISEASE PROGRESS NOTE  ID: Angela Bond is a 68 y.o. female with  Active Problems:   Atrial fibrillation   Hypertension   GERD (gastroesophageal reflux disease)   Depression   Hypercholesteremia   Arthritis   Chest pressure   Hypotension due to drugs   Shingles   Hyponatremia   Hyperkalemia  Subjective: Still some pain.   Abtx:  Anti-infectives   Start     Dose/Rate Route Frequency Ordered Stop   09/24/13 2200  valACYclovir (VALTREX) tablet 1,000 mg     1,000 mg Oral 2 times daily 09/24/13 1457     09/23/13 2200  valACYclovir (VALTREX) tablet 1,000 mg  Status:  Discontinued     1,000 mg Oral 3 times daily 09/23/13 1624 09/24/13 1457   09/23/13 1630  valACYclovir (VALTREX) tablet 1,000 mg  Status:  Discontinued     1,000 mg Oral 3 times daily 09/23/13 1618 09/23/13 1624   09/22/13 1400  acyclovir (ZOVIRAX) tablet 800 mg  Status:  Discontinued     800 mg Oral 5 times daily 09/22/13 1255 09/23/13 1618      Medications:  Scheduled: . atorvastatin  10 mg Oral q1800  . calcium carbonate  3 tablet Oral BID WC  . cholecalciferol  2,000 Units Oral BID  . clopidogrel  75 mg Oral Q breakfast  . enoxaparin (LOVENOX) injection  40 mg Subcutaneous Q24H  . loratadine  10 mg Oral Daily  . pantoprazole  80 mg Oral Daily  . valACYclovir  1,000 mg Oral BID    Objective: Vital signs in last 24 hours: Temp:  [98 F (36.7 C)-98.4 F (36.9 C)] 98.4 F (36.9 C) (05/15 0951) Pulse Rate:  [77-83] 83 (05/15 0951) Resp:  [18] 18 (05/15 0951) BP: (93-103)/(44-53) 93/44 mmHg (05/15 0951) SpO2:  [95 %-96 %] 96 % (05/15 0431) Weight:  [82.6 kg (182 lb 1.6 oz)] 82.6 kg (182 lb 1.6 oz) (05/14 2054)   General appearance: alert, cooperative and no distress Skin: some mild skin breakdown at previous rash sites.   Lab Results  Recent Labs  09/26/13 0551 09/27/13 0606  NA 130* 128*  K 5.8* 5.0  CL 95* 95*  CO2 25 25  BUN 28* 20  CREATININE 1.26* 0.97   Liver  Panel No results found for this basename: PROT, ALBUMIN, AST, ALT, ALKPHOS, BILITOT, BILIDIR, IBILI,  in the last 72 hours Sedimentation Rate No results found for this basename: ESRSEDRATE,  in the last 72 hours C-Reactive Protein No results found for this basename: CRP,  in the last 72 hours  Microbiology: Recent Results (from the past 240 hour(s))  MRSA PCR SCREENING     Status: Abnormal   Collection Time    09/20/13  5:15 PM      Result Value Ref Range Status   MRSA by PCR POSITIVE (*) NEGATIVE Final   Comment:            The GeneXpert MRSA Assay (FDA     approved for NASAL specimens     only), is one component of a     comprehensive MRSA colonization     surveillance program. It is not     intended to diagnose MRSA     infection nor to guide or     monitor treatment for     MRSA infections.     RESULT CALLED TO, READ BACK BY AND VERIFIED WITH:     A PETTIFORD RN 2049 09/20/13 A BROWNING  Studies/Results: No results found.   Assessment/Plan: Zoster  Cr improved Hyponatremia  Total days of antibiotics 6 of 14 Valtrex  Would continue her valtrex, will incrase to TID as her renal function has improved.  Mild breakdown of her blister sites but clean. Will need close wound f/u as well as close f/u of her Cr  Consider trial of neurontin or tricyclic for her neuropathic pain.  Available if questions          [facility] (pager) [phone] [email or web address] 09/27/2013, 11:52 AM  LOS: 7 days   **Disclaimer: This note may have been dictated with voice recognition software. Similar sounding words can inadvertently be transcribed and this note may contain transcription errors which may not have been corrected upon publication of note.**

## 2013-09-27 NOTE — Progress Notes (Signed)
PROGRESS NOTE  Angela Bond PXT:[id number] DOB: Mar 04, 1946 DOA: 09/20/2013 PCP: Simona Huh, MD  Assessment/Plan: Chest pain  - unclear etiology and possibly just musculoskeletal vs  neuropathic pain from shingles  - Lexiscan test negative  - CE's 3 negative, no events on telemetry and pt is hemodynamically stable this AM  - discontinue telemetry  Hyponatremia  -Na down to 128 today--component of SIADH vs lasting effects of fluoxetine -appears to be even to neg fluid balance on I/O if IVF taken into account -increase IVF to 100cc/hr -TSH 1.640  -May be related to fluoxetine--discontinue fluoxetine but has long half-life -Lisinopril and gabapentin may also have a role  -Serum osmolarity--264, urine osmolarity--340  -Urine creatinine, urine sodium--FeNa<1%  -continue IV fluids  -Discontinue lisinopril as blood pressure is soft  Hyperkalemia  -better after kayexalate -d/c lisinopril  -kayexalate--pt also c/o constipation  Atrial fibrillation  - in sinus rhythm this AM, rate controoled  Acute renal failure  - will provide gentle hydration today and repeat BMP in AM  -baseline creatinine 0.7-0.9  -?new baseline 1.0-1.3  Low back pain  - 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 Small hiatal hernia with adjacent surgical changes suggesting prior hernia repair. Sludge and/or  small stones layer within the gallbladder lumen. No secondary signs to suggest acute cholecystitis. Lower lumbar facet arthropathy.  Dg Chest Portable 1 View 09/20/2013 No active disease.  Antibiotics:  None      Family Communication:   Pt at beside Disposition Plan:   Home when medically stable          Procedures/Studies: Mr Lumbar Spine Wo Contrast  09/23/2013   CLINICAL DATA:  Low back pain.  Recent fall.  EXAM: MRI LUMBAR SPINE WITHOUT CONTRAST  TECHNIQUE: Multiplanar, multisequence MR imaging of the lumbar spine was performed. No intravenous contrast was administered.  COMPARISON:  CT abdomen and pelvis 09/20/2013  FINDINGS: Vertebral alignment is normal. There is no evidence of compression fracture. Small Schmorl's nodes are noted in the lower thoracic and upper lumbar spine. Vertebral bone marrow signal is within normal limits. The conus medullaris is normal in signal and terminates at L1-2. Visualized paraspinal soft tissues are unremarkable.  L1-2 and L2-3:  Negative.  L3-4: Mild facet and ligamentum flavum hypertrophy without stenosis.  L4-5: Minimal disc bulge and advanced facet and ligamentum flavum hypertrophy result in mild spinal canal narrowing. There is a moderate amount of fluid in the facet joints.  L5-S1: Severe bilateral facet hypertrophy with right greater than left facet joint fluid. No stenosis.  IMPRESSION: Advanced facet arthrosis at L4-5 and L5-S1. Mild spinal canal narrowing at L4-5. No evidence of neural impingement.   Electronically Signed   By: Logan Bores   On: 09/23/2013 15:50   Ct Abdomen Pelvis W Contrast  09/20/2013   CLINICAL DATA:  Left upper quadrant pain with eating  EXAM: CT  ABDOMEN AND PELVIS WITH CONTRAST  TECHNIQUE: Multidetector CT imaging of the abdomen and pelvis was performed using the standard protocol following bolus administration of intravenous contrast.  CONTRAST:  190mL OMNIPAQUE IOHEXOL 300 MG/ML  SOLN  COMPARISON:  Prior CT  abdomen/pelvis 10/15/2012  FINDINGS: Lower Chest: The visualized lower lungs are clear. The heart is within normal limits for size. No pericardial effusion. Atherosclerotic calcifications present within the visualized coronary arteries. There is a small hiatal hernia and surgical changes consistent with prior hiatal hernia repair.  Abdomen: Will unremarkable CT appearance of the stomach, duodenum, adrenal glands and pancreas save for dystrophic calcification in the pancreatic tail likely follow up prior chronic pancreatitis. No acute inflammation. 6 mm circumscribed cystic lesion in a inferior pole the spleen is nonspecific but almost certainly benign. Normal hepatic contour and morphology. No discrete hepatic lesion. Layering high attenuation within the gallbladder is with sludge and/ small stones. No intra or extrahepatic biliary ductal dilatation.  Unremarkable appearance of the bilateral kidneys. No focal solid lesion, hydronephrosis or nephrolithiasis. No evidence of obstruction or focal bowel wall thickening. Normal appendix in the right lower quadrant. The terminal ileum is unremarkable.  Pelvis: Unremarkable bladder, uterus and adnexa. No free fluid or suspicious adenopathy.  Bones/Soft Tissues: No acute fracture or aggressive appearing lytic or blastic osseous lesion. Lower lumbar facet arthropathy No suspicious adenopathy.  Vascular: No significant atherosclerotic vascular disease, aneurysmal dilatation or acute abnormality.  IMPRESSION: 1. No acute abnormality in the abdomen or pelvis. 2. Small hiatal hernia with adjacent surgical changes suggesting prior hernia repair. 3. Sludge and/or small stones layer within the gallbladder lumen. No secondary signs to suggest acute cholecystitis. 4. Lower lumbar facet arthropathy.   Electronically Signed   By: Jacqulynn Cadet M.D.   On: 09/20/2013 17:05   Nm Myocar Multi W/spect W/wall Motion / Ef  09/21/2013   CLINICAL DATA:  Chest pain, atrial fibrillation.   EXAM: NUCLEAR MEDICINE MYOCARDIAL PERFUSION IMAGING  NUCLEAR MEDICINE LEFT VENTRICULAR WALL MOTION ANALYSIS  NUCLEAR MEDICINE LEFT VENTRICULAR EJECTION FRACTION CALCULATION  TECHNIQUE: Standard single day myocardial SPECT imaging was performed after resting intravenous injection of Tc-12m sestamibi. After intravenous infusion of Lexiscan (regadenoson) under supervision of cardiology staff, sestamibiwas injected intravenously and standard myocardial SPECT imaging was performed. Quantitative gated imaging was also performed to evaluate left ventricular wall motion and estimate left ventricular ejection fraction.  Radiopharmaceutical: 10+30 mCi Tc39m sestamibiIV.  COMPARISON:  None  FINDINGS: The stress SPECT images demonstrate physiologic distribution of radiopharmaceutical. Rest images demonstrate no perfusion defects. The gated stress SPECT images demonstrate normal left ventricular myocardial thickening . No focal wall motion abnormality is seen. Calculated left ventricular end-diastolic volume [id number], end-systolic volume [id number], ejection fraction of 78%.  IMPRESSION: 1. Negative for pharmacologic-stress induced ischemia. 2. Left ventricular ejection fraction 78%.   Electronically Signed   By: Arne Cleveland M.D.   On: 09/21/2013 12:30   Dg Chest Portable 1 View  09/20/2013   CLINICAL DATA:  Chest pain  EXAM: PORTABLE CHEST - 1 VIEW  COMPARISON:  08/12/2012  FINDINGS: Generous heart size, without definitive cardiomegaly. Unremarkable upper mediastinal contours. No edema or consolidation. No effusion or pneumothorax.  IMPRESSION: No active disease.   Electronically Signed   By: Jorje Guild M.D.   On: 09/20/2013 04:22         Subjective: Patient complains of left hip pain. This has been bothering her for several weeks and is not new. She states that occasionally her left leg gives  out on her. She has seen orthopedics, Dr. Percell Miller in the past. Denies any neuropathic/radicular type symptoms. Denies any left  lower extremity weakness. No fevers, chills, shortness breath, nausea, vomiting, diarrhea, abdominal pain.  Objective: Filed Vitals:   09/26/13 0900 09/26/13 2054 09/27/13 0431 09/27/13 0951  BP: 101/55 102/53 103/53 93/44  Pulse: 81 79 77 83  Temp: 97.4 F (36.3 C) 98 F (36.7 C) 98.1 F (36.7 C) 98.4 F (36.9 C)  TempSrc: Oral Oral Oral Oral  Resp: 18 18 18 18   Height:  5' (1.524 m)    Weight:  82.6 kg (182 lb 1.6 oz)    SpO2: 95% 95% 96%     Intake/Output Summary (Last 24 hours) at 09/27/13 1120 Last data filed at 09/27/13 0900  Gross per 24 hour  Intake    240 ml  Output   1500 ml  Net  -1260 ml   Weight change: -1.8 kg (-3 lb 15.5 oz) Exam:   General:  Pt is alert, follows commands appropriately, not in acute distress  HEENT: No icterus, No thrush,  Spearman/AT  Cardiovascular: RRR, S1/S2, no rubs, no gallops  Respiratory: CTA bilaterally, no wheezing, no crackles, no rhonchi  Abdomen: Soft/+BS, non tender, non distended, no guarding  Extremities: No edema, No lymphangitis, No petechiae, No rashes, no synovitis; vesicular type rash extending from the left breast to the left thoracic area on the T7 dermatome. Negative straight leg raise  Data Reviewed: Basic Metabolic Panel:  Recent Labs Lab 09/23/13 0540 09/24/13 0555 09/25/13 0358 09/26/13 0551 09/27/13 0606  NA 135* 129* 125* 130* 128*  K 4.9 4.8 4.8 5.8* 5.0  CL 96 90* 88* 95* 95*  CO2 23 27 25 25 25   GLUCOSE 120* 102* 122* 90 90  BUN 23 24* 27* 28* 20  CREATININE 1.10 1.35* 1.20* 1.26* 0.97  CALCIUM 9.0 8.9 8.4 8.6 8.5   Liver Function Tests:  Recent Labs Lab 09/20/13 1426  AST 16  ALT 14  ALKPHOS 81  BILITOT 0.5  PROT 7.0  ALBUMIN 3.1*   No results found for this basename: LIPASE, AMYLASE,  in the last 168 hours No results found for this basename: AMMONIA,  in the last 168 hours CBC:  Recent Labs Lab 09/21/13 0332 09/22/13 0530 09/23/13 0540 09/24/13 0555  WBC 5.6 5.8 6.1 4.9    HGB 11.0* 11.1* 11.3* 11.2*  HCT 34.7* 35.4* 35.1* 34.3*  MCV 92.5 92.2 91.6 90.0  PLT 163 144* 163 159   Cardiac Enzymes:  Recent Labs Lab 09/20/13 1426 09/20/13 2022 09/21/13 0332  TROPONINI <0.30 <0.30 <0.30   BNP: No components found with this basename: POCBNP,  CBG: No results found for this basename: GLUCAP,  in the last 168 hours  Recent Results (from the past 240 hour(s))  MRSA PCR SCREENING     Status: Abnormal   Collection Time    09/20/13  5:15 PM      Result Value Ref Range Status   MRSA by PCR POSITIVE (*) NEGATIVE Final   Comment:            The GeneXpert MRSA Assay (FDA     approved for NASAL specimens     only), is one component of a     comprehensive MRSA colonization     surveillance program. It is not     intended to diagnose MRSA     infection nor to guide or     monitor treatment for  MRSA infections.     RESULT CALLED TO, READ BACK BY AND VERIFIED WITH:     A PETTIFORD RN 2049 09/20/13 A BROWNING     Scheduled Meds: . atorvastatin  10 mg Oral q1800  . calcium carbonate  3 tablet Oral BID WC  . cholecalciferol  2,000 Units Oral BID  . clopidogrel  75 mg Oral Q breakfast  . enoxaparin (LOVENOX) injection  40 mg Subcutaneous Q24H  . loratadine  10 mg Oral Daily  . pantoprazole  80 mg Oral Daily  . valACYclovir  1,000 mg Oral BID   Continuous Infusions: . sodium chloride 75 mL/hr at 09/26/13 1126     Orson Eva, DO  [facility] Pager [phone]  If 7PM-7AM, please contact night-coverage [email or web address] Password [email or web address] 09/27/2013, 11:20 AM   LOS: 7 days

## 2013-09-28 DIAGNOSIS — N179 Acute kidney failure, unspecified: Secondary | ICD-10-CM

## 2013-09-28 LAB — BASIC METABOLIC PANEL
BUN: 17 mg/dL (ref 6–23)
CO2: 23 mEq/L (ref 19–32)
CREATININE: 0.91 mg/dL (ref 0.50–1.10)
Calcium: 8.9 mg/dL (ref 8.4–10.5)
Chloride: 96 mEq/L (ref 96–112)
GFR, EST AFRICAN AMERICAN: 73 mL/min — AB (ref >90)
GFR, EST NON AFRICAN AMERICAN: 63 mL/min — AB (ref >90)
Glucose, Bld: 141 mg/dL — ABNORMAL HIGH (ref 70–99)
Potassium: 4.3 mEq/L (ref 3.7–5.3)
Sodium: 135 mEq/L — ABNORMAL LOW (ref 137–147)

## 2013-09-28 MED ORDER — HYDROCODONE-ACETAMINOPHEN 5-325 MG PO TABS: 1.0000 | ORAL_TABLET | ORAL | Status: DC | PRN

## 2013-09-28 MED ORDER — VALACYCLOVIR HCL 1 G PO TABS: 1000.0000 mg | ORAL_TABLET | Freq: Three times a day (TID) | ORAL | Status: DC

## 2013-09-28 NOTE — Discharge Summary (Signed)
Physician Discharge Summary  Angela Bond [id number] DOB: June 28, 1945 DOA: 09/20/2013  PCP: Simona Huh, MD  Admit date: 09/20/2013 Discharge date: 09/28/2013  Recommendations for Outpatient Follow-up:  1. Pt will need to follow up with PCP in 2 weeks post discharge 2. Please obtain BMP to evaluate electrolytes and kidney function 3. Please also check CBC to evaluate Hg and Hct levels  Discharge Diagnoses:  Chest pain  - unclear etiology and possibly just musculoskeletal vs neuropathic pain from shingles  - Lexiscan test negative  - CE's 3 negative, no events on telemetry and pt is hemodynamically stable this AM  - discontinue telemetry  Hyponatremia  -increase IVF to 100cc/hr--> sodium 135 on the day of discharge -improved with IV fluids and discontinuation of fluoxetine -Patient will followup with primary care provider to determine alternative antidepressants  -TSH 1.640  -May be related to fluoxetine--discontinue fluoxetine but has long half-life  -Lisinopril and gabapentin may also have a role  -Serum osmolarity--264, urine osmolarity--340  -Urine creatinine, urine sodium--FeNa<1%  -continue IV fluids  -Discontinue lisinopril as blood pressure is soft  Hyperkalemia  -better after kayexalate  -d/c lisinopril  -Blood pressure remains stable off of antihypertensive medications  Atrial fibrillation  - in sinus rhythm this AM, rate controlled  Acute renal failure  - will provide gentle hydration today and repeat BMP in AM  -baseline creatinine 0.7-0.9  -?new baseline 1.0-1.3  -Serum creatinine 0.91 on the day of discharge Low back pain  - MRI lumbar spine with chronic osteoarthritic changes, no acute findings  - continue analgesia as needed  -dg xray left hip--negative for dislocation or fracture  Shingles  - dermatome level T6 or T7  - appreciate ID input -Continue Valtrex--continue for 7 additional days which will complete 14 days of therapy  -Antivirals  started on 09/22/2013  Hypertension  - Discontinue lisinopril as blood pressure is soft  -Lisinopril may also be contributing to the patient's hyponatremia  -Blood pressure has remained stable off of all antihypertensive medications  -Patient will follow up with primary care provider to determine future need of antihypertensive meds  GERD (gastroesophageal reflux disease)  - stable, continue PPI  Depression  - stable clinically  Hypercholesteremia  - continue statin  Consultants:  ID Procedures/Studies:  Ct Abdomen Pelvis W Contrast 09/20/2013 No acute abnormality in the abdomen or pelvis. Small hiatal hernia with adjacent surgical changes suggesting prior hernia repair. Sludge and/or small stones layer within the gallbladder lumen. No secondary signs to suggest acute cholecystitis. Lower lumbar facet arthropathy.  Dg Chest Portable 1 View 09/20/2013 No active disease.  Antibiotics:  None   Discharge Condition: Stable  Disposition: Home     Follow-up Information   Schedule an appointment as soon as possible for a visit with Simona Huh, MD.   Specialty:  Family Medicine   Contact information:   [place]. [address], [address] [place] [address] [phone]       Diet: Heart healthy Wt Readings from Last 3 Encounters:  09/28/13 80 kg (176 lb 5.9 oz)  08/13/13 83.915 kg (185 lb)  11/08/12 77.565 kg (171 lb)    History of present illness:  68 y.o. year-old female with history of hypertension, hyperlipidemia, PAF during hiatal hernia surgery last year which resolved (not on a/c), acid reflux, arthritis who presents from home with chest pressure. The patient was last at their baseline health until about a month ago. She had a fall on her right side which caused a scalp hematoma  and left knee hematoma. After that time, she has been experiencing DOE with simple ambulation with profuse swelling. No obvious chest pressure or nausea but does feel a presyncopal. Six days ago, she  developed chest pressure which radiates from her left lower scapular area to the anterior left chest 8/10, constant, not associated with nausea, vomiting, diaphoresis, SOB worse than before. She does have some pain which radiates to the right chest. Eating causes increased pressure as does pressing on the left upper abdomen. She thought her symptoms were related to acid reflux, but tums, rolaids, gas-x did not help. 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  In the emergency department, she was given morphine which helped her pain. Labs were notable for mild normocytic anemia, BMP was hemolyzed a followup potassium was within normal limits. Troponin was negative. EKG demonstrated normal sinus rhythm with PVCs and no ST segment abnormalities or T-wave inversions. Chest x-ray was unremarkable. She received a dose of nitroglycerin which dropped her blood pressure to the 16X systolic and made her feel weak and lightheaded.     Discharge Exam: Filed Vitals:   09/28/13 0900  BP: 138/85  Pulse: 67  Temp: 98.6 F (37 C)  Resp: 18   Filed Vitals:   09/27/13 1403 09/27/13 1800 09/28/13 0900 09/28/13 1028  BP: 125/70 123/67 138/85   Pulse: 83 80 67   Temp: 98.2 F (36.8 C) 98.9 F (37.2 C) 98.6 F (37 C)   TempSrc: Oral Oral Oral   Resp: 20 18 18    Height:    5' (1.524 m)  Weight:    80 kg (176 lb 5.9 oz)  SpO2: 100% 98% 97%    General: A&O x 3, NAD, pleasant, cooperative Cardiovascular: RRR, no rub, no gallop, no S3 Respiratory: CTAB, no wheeze, no rhonchi Abdomen:soft, nontender, nondistended, positive bowel sounds Extremities: No edema, No lymphangitis, no petechiaevesicular type rash extending from the left breast to the left thoracic area on the T7 dermatome ;  Discharge Instructions     Medication List    STOP taking these medications       FLUoxetine 20 MG capsule  Commonly known as:  PROZAC     lisinopril 20 MG  tablet  Commonly known as:  PRINIVIL,ZESTRIL     meloxicam 15 MG tablet  Commonly known as:  MOBIC      TAKE these medications       ALPRAZolam 0.25 MG tablet  Commonly known as:  XANAX  Take 0.25 mg by mouth at bedtime as needed for sleep or anxiety.     calcium carbonate 600 MG Tabs tablet  Commonly known as:  OS-CAL  Take 600 mg by mouth 2 (two) times daily with a meal.     carboxymethylcellulose 0.5 % Soln  Commonly known as:  REFRESH PLUS  1 drop 3 (three) times daily as needed.     fexofenadine 180 MG tablet  Commonly known as:  ALLEGRA  Take 180 mg by mouth daily.     HYDROcodone-acetaminophen 5-325 MG per tablet  Commonly known as:  NORCO  Take 1 tablet by mouth every 4 (four) hours as needed for moderate pain.     lovastatin 40 MG tablet  Commonly known as:  MEVACOR  Take 40 mg by mouth daily before breakfast.     meclizine 25 MG tablet  Commonly known as:  ANTIVERT  Take 12.5-25 mg by mouth 3 (three) times  daily as needed for dizziness.     omeprazole 40 MG capsule  Commonly known as:  PRILOSEC  Take 40 mg by mouth daily.     sodium chloride 0.65 % Soln nasal spray  Commonly known as:  OCEAN  Place 2 sprays into both nostrils as needed for congestion.     valACYclovir 1000 MG tablet  Commonly known as:  VALTREX  Take 1 tablet (1,000 mg total) by mouth 3 (three) times daily.     Vitamin D 2000 UNITS tablet  Take 2,000 Units by mouth 2 (two) times daily.         The results of significant diagnostics from this hospitalization (including imaging, microbiology, ancillary and laboratory) are listed below for reference.    Significant Diagnostic Studies: Dg Hip Complete Left  09/27/2013   CLINICAL DATA:  Chronic left lateral hip pain.  EXAM: LEFT HIP - COMPLETE 2+ VIEW  COMPARISON:  None.  FINDINGS: The left hip is located. No acute bone or soft tissue abnormalities are present. Degenerative changes are noted at the SI joints bilaterally.  IMPRESSION:  1. Imaging of the left hip is negative. 2. Bilateral sacroiliac degenerative change.   Electronically Signed   By: Lawrence Santiago M.D.   On: 09/27/2013 12:47   Mr Lumbar Spine Wo Contrast  09/23/2013   CLINICAL DATA:  Low back pain.  Recent fall.  EXAM: MRI LUMBAR SPINE WITHOUT CONTRAST  TECHNIQUE: Multiplanar, multisequence MR imaging of the lumbar spine was performed. No intravenous contrast was administered.  COMPARISON:  CT abdomen and pelvis 09/20/2013  FINDINGS: Vertebral alignment is normal. There is no evidence of compression fracture. Small Schmorl's nodes are noted in the lower thoracic and upper lumbar spine. Vertebral bone marrow signal is within normal limits. The conus medullaris is normal in signal and terminates at L1-2. Visualized paraspinal soft tissues are unremarkable.  L1-2 and L2-3:  Negative.  L3-4: Mild facet and ligamentum flavum hypertrophy without stenosis.  L4-5: Minimal disc bulge and advanced facet and ligamentum flavum hypertrophy result in mild spinal canal narrowing. There is a moderate amount of fluid in the facet joints.  L5-S1: Severe bilateral facet hypertrophy with right greater than left facet joint fluid. No stenosis.  IMPRESSION: Advanced facet arthrosis at L4-5 and L5-S1. Mild spinal canal narrowing at L4-5. No evidence of neural impingement.   Electronically Signed   By: Logan Bores   On: 09/23/2013 15:50   Ct Abdomen Pelvis W Contrast  09/20/2013   CLINICAL DATA:  Left upper quadrant pain with eating  EXAM: CT ABDOMEN AND PELVIS WITH CONTRAST  TECHNIQUE: Multidetector CT imaging of the abdomen and pelvis was performed using the standard protocol following bolus administration of intravenous contrast.  CONTRAST:  160mL OMNIPAQUE IOHEXOL 300 MG/ML  SOLN  COMPARISON:  Prior CT abdomen/pelvis 10/15/2012  FINDINGS: Lower Chest: The visualized lower lungs are clear. The heart is within normal limits for size. No pericardial effusion. Atherosclerotic calcifications present  within the visualized coronary arteries. There is a small hiatal hernia and surgical changes consistent with prior hiatal hernia repair.  Abdomen: Will unremarkable CT appearance of the stomach, duodenum, adrenal glands and pancreas save for dystrophic calcification in the pancreatic tail likely follow up prior chronic pancreatitis. No acute inflammation. 6 mm circumscribed cystic lesion in a inferior pole the spleen is nonspecific but almost certainly benign. Normal hepatic contour and morphology. No discrete hepatic lesion. Layering high attenuation within the gallbladder is with sludge and/ small stones. No intra  or extrahepatic biliary ductal dilatation.  Unremarkable appearance of the bilateral kidneys. No focal solid lesion, hydronephrosis or nephrolithiasis. No evidence of obstruction or focal bowel wall thickening. Normal appendix in the right lower quadrant. The terminal ileum is unremarkable.  Pelvis: Unremarkable bladder, uterus and adnexa. No free fluid or suspicious adenopathy.  Bones/Soft Tissues: No acute fracture or aggressive appearing lytic or blastic osseous lesion. Lower lumbar facet arthropathy No suspicious adenopathy.  Vascular: No significant atherosclerotic vascular disease, aneurysmal dilatation or acute abnormality.  IMPRESSION: 1. No acute abnormality in the abdomen or pelvis. 2. Small hiatal hernia with adjacent surgical changes suggesting prior hernia repair. 3. Sludge and/or small stones layer within the gallbladder lumen. No secondary signs to suggest acute cholecystitis. 4. Lower lumbar facet arthropathy.   Electronically Signed   By: Jacqulynn Cadet M.D.   On: 09/20/2013 17:05   Nm Myocar Multi W/spect W/wall Motion / Ef  09/21/2013   CLINICAL DATA:  Chest pain, atrial fibrillation.  EXAM: NUCLEAR MEDICINE MYOCARDIAL PERFUSION IMAGING  NUCLEAR MEDICINE LEFT VENTRICULAR WALL MOTION ANALYSIS  NUCLEAR MEDICINE LEFT VENTRICULAR EJECTION FRACTION CALCULATION  TECHNIQUE: Standard  single day myocardial SPECT imaging was performed after resting intravenous injection of Tc-73m sestamibi. After intravenous infusion of Lexiscan (regadenoson) under supervision of cardiology staff, sestamibiwas injected intravenously and standard myocardial SPECT imaging was performed. Quantitative gated imaging was also performed to evaluate left ventricular wall motion and estimate left ventricular ejection fraction.  Radiopharmaceutical: 10+30 mCi Tc76m sestamibiIV.  COMPARISON:  None  FINDINGS: The stress SPECT images demonstrate physiologic distribution of radiopharmaceutical. Rest images demonstrate no perfusion defects. The gated stress SPECT images demonstrate normal left ventricular myocardial thickening . No focal wall motion abnormality is seen. Calculated left ventricular end-diastolic volume 45YK, end-systolic volume 99IP, ejection fraction of 78%.  IMPRESSION: 1. Negative for pharmacologic-stress induced ischemia. 2. Left ventricular ejection fraction 78%.   Electronically Signed   By: Arne Cleveland M.D.   On: 09/21/2013 12:30   Dg Chest Portable 1 View  09/20/2013   CLINICAL DATA:  Chest pain  EXAM: PORTABLE CHEST - 1 VIEW  COMPARISON:  08/12/2012  FINDINGS: Generous heart size, without definitive cardiomegaly. Unremarkable upper mediastinal contours. No edema or consolidation. No effusion or pneumothorax.  IMPRESSION: No active disease.   Electronically Signed   By: Jorje Guild M.D.   On: 09/20/2013 04:22     Microbiology: Recent Results (from the past 240 hour(s))  MRSA PCR SCREENING     Status: Abnormal   Collection Time    09/20/13  5:15 PM      Result Value Ref Range Status   MRSA by PCR POSITIVE (*) NEGATIVE Final   Comment:            The GeneXpert MRSA Assay (FDA     approved for NASAL specimens     only), is one component of a     comprehensive MRSA colonization     surveillance program. It is not     intended to diagnose MRSA     infection nor to guide or      monitor treatment for     MRSA infections.     RESULT CALLED TO, READ BACK BY AND VERIFIED WITH:     A PETTIFORD RN [id number] 09/20/13 A BROWNING     Labs: Basic Metabolic Panel:  Recent Labs Lab 09/24/13 0555 09/25/13 0358 09/26/13 0551 09/27/13 0606 09/28/13 1000  NA 129* 125* 130* 128* 135*  K 4.8 4.8 5.8* 5.0  4.3  CL 90* 88* 95* 95* 96  CO2 27 25 25 25 23   GLUCOSE 102* 122* 90 90 141*  BUN 24* 27* 28* 20 17  CREATININE 1.35* 1.20* 1.26* 0.97 0.91  CALCIUM 8.9 8.4 8.6 8.5 8.9   Liver Function Tests: No results found for this basename: AST, ALT, ALKPHOS, BILITOT, PROT, ALBUMIN,  in the last 168 hours No results found for this basename: LIPASE, AMYLASE,  in the last 168 hours No results found for this basename: AMMONIA,  in the last 168 hours CBC:  Recent Labs Lab 09/22/13 0530 09/23/13 0540 09/24/13 0555  WBC 5.8 6.1 4.9  HGB 11.1* 11.3* 11.2*  HCT 35.4* 35.1* 34.3*  MCV 92.2 91.6 90.0  PLT 144* 163 159   Cardiac Enzymes: No results found for this basename: CKTOTAL, CKMB, CKMBINDEX, TROPONINI,  in the last 168 hours BNP: No components found with this basename: POCBNP,  CBG: No results found for this basename: GLUCAP,  in the last 168 hours  Time coordinating discharge:  Greater than 30 minutes  Signed:  Orson Eva, DO [facility] Pager: [phone] 09/28/2013, 12:00 PM

## 2013-09-28 NOTE — Progress Notes (Signed)
  Patient Discharge:  Disposition: discharge home  Education: Educated patient on discharge instructions, follow-up appointments and discharge medications.  Educated patient on chest pain and shingles, signs and symptoms, and when to call a doctor.    IV: IV removed.  Site clean, dry, and intact  Telemetry: n/a   Follow-up appointments: Patient verbalized that she would make follow-up appointment with her PCP within one week  Prescriptions:  Pain medication prescription given to patient.  Other prescriptions called to [facility].    Transportation:  Escorted to ride via wheelchair with NT  Belongings: patient gathered belongings

## 2013-10-24 ENCOUNTER — Ambulatory Visit: Payer: Medicare Other | Admitting: Podiatry

## 2014-01-24 ENCOUNTER — Other Ambulatory Visit: Payer: Self-pay | Admitting: Podiatry

## 2014-01-27 ENCOUNTER — Other Ambulatory Visit: Payer: Self-pay | Admitting: Gastroenterology

## 2014-01-27 DIAGNOSIS — R1013 Epigastric pain: Secondary | ICD-10-CM

## 2014-01-28 ENCOUNTER — Ambulatory Visit
Admission: RE | Admit: 2014-01-28 | Discharge: 2014-01-28 | Disposition: A | Discharge status: Home / Self Care | Payer: Medicare Other | Source: Ambulatory Visit | Attending: Gastroenterology | Admitting: Gastroenterology

## 2014-01-28 DIAGNOSIS — R1013 Epigastric pain: Secondary | ICD-10-CM

## 2014-03-15 ENCOUNTER — Encounter (HOSPITAL_COMMUNITY): Payer: Self-pay | Admitting: Emergency Medicine

## 2014-03-15 ENCOUNTER — Emergency Department (HOSPITAL_COMMUNITY)
Admission: EM | Admit: 2014-03-15 | Discharge: 2014-03-15 | Disposition: A | Discharge status: Home / Self Care | Payer: Medicare Other | Attending: Emergency Medicine | Admitting: Emergency Medicine

## 2014-03-15 DIAGNOSIS — Y9389 Activity, other specified: Secondary | ICD-10-CM | POA: Diagnosis not present

## 2014-03-15 DIAGNOSIS — I10 Essential (primary) hypertension: Secondary | ICD-10-CM | POA: Insufficient documentation

## 2014-03-15 DIAGNOSIS — E780 Pure hypercholesterolemia: Secondary | ICD-10-CM | POA: Insufficient documentation

## 2014-03-15 DIAGNOSIS — Y9289 Other specified places as the place of occurrence of the external cause: Secondary | ICD-10-CM | POA: Diagnosis not present

## 2014-03-15 DIAGNOSIS — T63454A Toxic effect of venom of hornets, undetermined, initial encounter: Secondary | ICD-10-CM | POA: Diagnosis not present

## 2014-03-15 DIAGNOSIS — F419 Anxiety disorder, unspecified: Secondary | ICD-10-CM | POA: Diagnosis not present

## 2014-03-15 DIAGNOSIS — R011 Cardiac murmur, unspecified: Secondary | ICD-10-CM | POA: Insufficient documentation

## 2014-03-15 DIAGNOSIS — K219 Gastro-esophageal reflux disease without esophagitis: Secondary | ICD-10-CM | POA: Diagnosis not present

## 2014-03-15 DIAGNOSIS — M1990 Unspecified osteoarthritis, unspecified site: Secondary | ICD-10-CM | POA: Diagnosis not present

## 2014-03-15 DIAGNOSIS — F329 Major depressive disorder, single episode, unspecified: Secondary | ICD-10-CM | POA: Diagnosis not present

## 2014-03-15 DIAGNOSIS — Z79899 Other long term (current) drug therapy: Secondary | ICD-10-CM | POA: Insufficient documentation

## 2014-03-15 DIAGNOSIS — Z791 Long term (current) use of non-steroidal anti-inflammatories (NSAID): Secondary | ICD-10-CM | POA: Diagnosis not present

## 2014-03-15 MED ORDER — ONDANSETRON 4 MG PO TBDP
8.0000 mg | ORAL_TABLET | Freq: Once | ORAL | Status: AC
  Administered 2014-03-15: 8 mg via ORAL
  Filled 2014-03-15: qty 2

## 2014-03-15 MED ORDER — LIDOCAINE HCL (PF) 1 % IJ SOLN
5.0000 mL | Freq: Once | INTRAMUSCULAR | Status: AC
  Administered 2014-03-15: 5 mL via INTRADERMAL
  Filled 2014-03-15: qty 5

## 2014-03-15 MED ORDER — HYDROCODONE-ACETAMINOPHEN 5-325 MG PO TABS: 1.0000 | ORAL_TABLET | Freq: Four times a day (QID) | ORAL | Status: DC | PRN

## 2014-03-15 NOTE — ED Notes (Signed)
Patient here with complaint of hornet sting to right thumb. States that it occurred about 30 minutes ago. Denies history of allergy to bee stings. Denies SOB or throat swelling.

## 2014-03-15 NOTE — Discharge Instructions (Signed)
1. Medications: vicodin, usual home medications 2. Treatment: rest, drink plenty of fluids, keep wound clean 3. Follow Up: Please followup with your primary doctor in 2 days for discussion of your diagnoses and further evaluation after today's visit; if you do not have a primary care doctor use the resource guide provided to find one; Please return to the ER for redness, fever, indication of infection   Bee, Wasp, or Hornet Sting Your caregiver has diagnosed you as having an insect sting. An insect sting appears as a red lump in the skin that sometimes has a tiny hole in the center, or it may have a stinger in the center of the wound. The most common stings are from wasps, hornets and bees. Individuals have different reactions to insect stings.  A normal reaction may cause pain, swelling, and redness around the sting site.  A localized allergic reaction may cause swelling and redness that extends beyond the sting site.  A large local reaction may continue to develop over the next 12 to 36 hours.  On occasion, the reactions can be severe (anaphylactic reaction). An anaphylactic reaction may cause wheezing; difficulty breathing; chest pain; fainting; raised, itchy, red patches on the skin; a sick feeling to your stomach (nausea); vomiting; cramping; or diarrhea. If you have had an anaphylactic reaction to an insect sting in the past, you are more likely to have one again. HOME CARE INSTRUCTIONS   With bee stings, a small sac of poison is left in the wound. Brushing across this with something such as a credit card, or anything similar, will help remove this and decrease the amount of the reaction. This same procedure will not help a wasp sting as they do not leave behind a stinger and poison sac.  Apply a cold compress for 10 to 20 minutes every hour for 1 to 2 days, depending on severity, to reduce swelling and itching.  To lessen pain, a paste made of water and baking soda may be rubbed on the  bite or sting and left on for 5 minutes.  To relieve itching and swelling, you may use take medication or apply medicated creams or lotions as directed.  Only take over-the-counter or prescription medicines for pain, discomfort, or fever as directed by your caregiver.  Wash the sting site daily with soap and water. Apply antibiotic ointment on the sting site as directed.  If you suffered a severe reaction:  If you did not require hospitalization, an adult will need to stay with you for 24 hours in case the symptoms return.  You may need to wear a medical bracelet or necklace stating the allergy.  You and your family need to learn when and how to use an anaphylaxis kit or epinephrine injection.  If you have had a severe reaction before, always carry your anaphylaxis kit with you. SEEK MEDICAL CARE IF:   None of the above helps within 2 to 3 days.  The area becomes red, warm, tender, and swollen beyond the area of the bite or sting.  You have an oral temperature above 102 F (38.9 C). SEEK IMMEDIATE MEDICAL CARE IF:  You have symptoms of an allergic reaction which are:  Wheezing.  Difficulty breathing.  Chest pain.  Lightheadedness or fainting.  Itchy, raised, red patches on the skin.  Nausea, vomiting, cramping or diarrhea. ANY OF THESE SYMPTOMS MAY REPRESENT A SERIOUS PROBLEM THAT IS AN EMERGENCY. Do not wait to see if the symptoms will go away. Get medical help  right away. Call your local emergency services (911 in [place] DO NOT drive yourself to the hospital. MAKE SURE YOU:   Understand these instructions.  Will watch your condition.  Will get help right away if you are not doing well or get worse. Document Released: 05/02/2005 Document Revised: 07/25/2011 Document Reviewed: 10/17/2009 [facility] Patient Information 2015 [place], [place]. This information is not intended to replace advice given to you by your health care provider. Make sure you discuss any questions you  have with your health care provider.

## 2014-03-15 NOTE — ED Provider Notes (Signed)
Medical screening examination/treatment/procedure(s) were performed by non-physician practitioner and as supervising physician I was immediately available for consultation/collaboration.   EKG Interpretation None        Delice Bison Eilish Mcdaniel, DO 03/15/14 2351

## 2014-03-15 NOTE — ED Provider Notes (Signed)
CSN: [id number]     Arrival date & time 03/15/14  2124 History  This chart was scribed for non-physician practitioner, Abigail Butts, PA-C,  working with [place], DO by Evelene Croon, ED Scribe. This patient was seen in room TR07C/TR07C and the patient's care was started at 11:00 PM.     Chief Complaint  Patient presents with  . Insect Bite  . Nausea      The history is provided by the patient, the spouse and medical records. No language interpreter was used.    HPI Comments:  Angela Bond is a 68 y.o. female who presents to the Emergency Department s/p hornet sting around 2130 today complaining of foreign body under right thumb nail following the incident with mild-moderate associated pain surrounding the area. Pt believes the foreign body is the stinger of the hornet that stung her. She also reports mild nausea that started PTA, which has since resolved - no vomiting. She is not currently on any blood thinners. Denies cardiac history. She also denies rash, SOB, throat swelling following sting and at this time. No alleviating factors noted.   Past Medical History  Diagnosis Date  . Arthritis   . Hypertension   . GERD (gastroesophageal reflux disease)   . Hypercholesteremia   . Heart murmur   . Anxiety and depression   . H/O hiatal hernia   . Shortness of breath     "stomach is in lungs"  . Headache(784.0)     sinus   Past Surgical History  Procedure Laterality Date  . Total knee arthroplasty    . Eye surgery  2013    tube placed Left  . Esophageal manometry N/A 07/02/2012    Procedure: ESOPHAGEAL MANOMETRY (EM);  Surgeon: Lear Ng, MD;  Location: [facility];  Service: Endoscopy;  Laterality: N/A;  schooler to read  . Joint replacement Bilateral 2011,2012    knees  . Breast surgery      r breast biopsy  . Tonsillectomy  as child  . Hiatal hernia repair N/A 08/10/2012    Procedure: LAPAROSCOPIC REPAIR OF HIATAL HERNIA WITH MESH AND EGD WITH  PEG TUBE PLACEMENT;  Surgeon: Ralene Ok, MD;  Location: [facility];  Service: General;  Laterality: N/A;   Family History  Problem Relation Age of Onset  . Heart disease Mother     41s, CHF  . Heart disease Father     d/o MI at 41  . Cancer Father     skin  . Cancer Sister     breast  . Heart disease Sister     CABG in mid-60s   History  Substance Use Topics  . Smoking status: Never Smoker   . Smokeless tobacco: Never Used  . Alcohol Use: No   OB History   Grav Para Term Preterm Abortions TAB SAB Ect Mult Living                 Review of Systems  Constitutional: Negative for fever.  Respiratory: Negative for shortness of breath.   Gastrointestinal: Positive for nausea. Negative for vomiting.  Skin: Positive for wound (Foreign body under right thumb nail).  Allergic/Immunologic: Negative for immunocompromised state.  Neurological: Negative for weakness and numbness.  Hematological: Does not bruise/bleed easily.  Psychiatric/Behavioral: The patient is not nervous/anxious.   All other systems reviewed and are negative.     Allergies  Lactose intolerance (gi); Aleve; Aspirin; Ciprofloxacin; Doxycycline; Evista; Fosamax; Pravachol; Sulfa antibiotics; Welchol; and Zocor  Home Medications   Prior to Admission medications   Medication Sig Start Date End Date Taking? Authorizing Provider  ALPRAZolam Duanne Moron) 0.25 MG tablet Take 0.25 mg by mouth at bedtime as needed for sleep or anxiety.     Historical Provider, MD  calcium carbonate (OS-CAL) 600 MG TABS Take 600 mg by mouth 2 (two) times daily with a meal.    Historical Provider, MD  carboxymethylcellulose (REFRESH PLUS) 0.5 % SOLN 1 drop 3 (three) times daily as needed.    Historical Provider, MD  Cholecalciferol (VITAMIN D) 2000 UNITS tablet Take 2,000 Units by mouth 2 (two) times daily.     Historical Provider, MD  fexofenadine (ALLEGRA) 180 MG tablet Take 180 mg by mouth daily.    Historical Provider, MD   HYDROcodone-acetaminophen (NORCO) 5-325 MG per tablet Take 1 tablet by mouth every 4 (four) hours as needed for moderate pain. 09/28/13   Orson Eva, MD  HYDROcodone-acetaminophen (NORCO/VICODIN) 5-325 MG per tablet Take 1 tablet by mouth every 6 (six) hours as needed for moderate pain or severe pain. 03/15/14   Shiva Karis, PA-C  lovastatin (MEVACOR) 40 MG tablet Take 40 mg by mouth daily before breakfast.     Historical Provider, MD  meclizine (ANTIVERT) 25 MG tablet Take 12.5-25 mg by mouth 3 (three) times daily as needed for dizziness.    Historical Provider, MD  meloxicam (MOBIC) 15 MG tablet TAKE 1 TABLET (15 MG TOTAL) BY MOUTH DAILY. 01/28/14   Max T Hyatt, DPM  omeprazole (PRILOSEC) 40 MG capsule Take 40 mg by mouth daily.    Historical Provider, MD  sodium chloride (OCEAN) 0.65 % SOLN nasal spray Place 2 sprays into both nostrils as needed for congestion.    Historical Provider, MD  valACYclovir (VALTREX) 1000 MG tablet Take 1 tablet (1,000 mg total) by mouth 3 (three) times daily. 09/28/13   Orson Eva, MD   BP 131/67  Pulse 80  Temp(Src) 98 F (36.7 C) (Oral)  Resp 18  Wt 185 lb (83.915 kg)  SpO2 98% Physical Exam  Nursing note and vitals reviewed. Constitutional: She is oriented to person, place, and time. She appears well-developed and well-nourished. No distress.  HENT:  Head: Normocephalic and atraumatic.  Right Ear: Tympanic membrane, external ear and ear canal normal.  Left Ear: Tympanic membrane, external ear and ear canal normal.  Nose: Nose normal. No mucosal edema or rhinorrhea.  Mouth/Throat: Uvula is midline. No uvula swelling. No oropharyngeal exudate, posterior oropharyngeal edema, posterior oropharyngeal erythema or tonsillar abscesses.  No swelling of the uvula or oropharynx   Eyes: Conjunctivae are normal. No scleral icterus.  Neck: Normal range of motion.  Patent airway No stridor; normal phonation Handling secretions without difficulty   Cardiovascular: Normal rate, regular rhythm, normal heart sounds and intact distal pulses.   No murmur heard. Capillary refill < 3 sec  Pulmonary/Chest: Effort normal and breath sounds normal. No stridor. No respiratory distress. She has no wheezes.  No wheezes or rhonchi  Abdominal: Soft. Bowel sounds are normal. There is no tenderness.  Musculoskeletal: Normal range of motion. She exhibits no edema.  ROM: Full range of motion of all fingers of the right hand  Neurological: She is alert and oriented to person, place, and time.  Sensation: Intact to dull and sharp Strength: 5/5 including strong grip strength on the right hand  Skin: Skin is warm and dry. No rash noted. She is not diaphoretic.  Swelling and redness to the right thumb with visualized  retained stinger under the right thumbnail.   No rash or urticaria   Psychiatric: She has a normal mood and affect.    ED Course  FOREIGN BODY REMOVAL Date/Time: 03/15/2014 11:35 PM Performed by: Abigail Butts Authorized by: Abigail Butts Consent: Verbal consent obtained. Risks and benefits: risks, benefits and alternatives were discussed Consent given by: patient Patient understanding: patient states understanding of the procedure being performed Patient consent: the patient's understanding of the procedure matches consent given Procedure consent: procedure consent matches procedure scheduled Relevant documents: relevant documents present and verified Site marked: the operative site was marked Required items: required blood products, implants, devices, and special equipment available Patient identity confirmed: verbally with patient and arm band Time out: Immediately prior to procedure a "time out" was called to verify the correct patient, procedure, equipment, support staff and site/side marked as required. Body area: skin General location: upper extremity Location details: right thumb Anesthesia: digital block Local  anesthetic: lidocaine 1% without epinephrine Anesthetic total: 4 ml Patient sedated: no Patient restrained: no Patient cooperative: yes Localization method: visualized Removal mechanism: forceps Dressing: dressing applied Tendon involvement: none Depth: subcutaneous Complexity: simple 1 objects recovered. Objects recovered: bee stinger Post-procedure assessment: foreign body removed Patient tolerance: Patient tolerated the procedure well with no immediate complications.     DIAGNOSTIC STUDIES:  Oxygen Saturation is 97% on RA, normal by my interpretation.    COORDINATION OF CARE:  11:07 PM Discussed treatment plan with pt at bedside and pt agreed to plan.  Labs Review Labs Reviewed - No data to display  Imaging Review No results found.   EKG Interpretation None      MDM   Final diagnoses:  Hornet sting, undetermined intent, initial encounter   SHANIQUE ASLINGER presents with bee sting to the right thumb with retained bee sting or under the right thumbnail.  Digital block given and the stinger removed.  No break in the skin or laceration.  No evidence of allergic reaction.  Patient requests pain control for sleep tonight.  She is to follow-up with her primary care physician within 2 days for wound check. She is to return to the emergency department for signs of allergic reaction or infection.  I have personally reviewed patient's vitals, nursing note and any pertinent labs or imaging.  I performed an focused physical exam; undressed when appropriate .    It has been determined that no acute conditions requiring further emergency intervention are present at this time. The patient/guardian have been advised of the diagnosis and plan. I reviewed any labs and imaging including any potential incidental findings. We have discussed signs and symptoms that warrant return to the ED and they are listed in the discharge instructions.    Vital signs are stable at discharge.   BP  131/67  Pulse 80  Temp(Src) 98 F (36.7 C) (Oral)  Resp 18  Wt 185 lb (83.915 kg)  SpO2 98%   I personally performed the services described in this documentation, which was scribed in my presence. The recorded information has been reviewed and is accurate.  The patient was discussed with and seen by Dr. Leonides Schanz  who agrees with the treatment plan.   Jarrett Soho Siria Calandro, PA-C 03/15/14 2339

## 2014-05-16 HISTORY — PX: POSTERIOR LUMBAR FUSION: SHX6036

## 2014-06-01 ENCOUNTER — Other Ambulatory Visit: Payer: Self-pay | Admitting: Podiatry

## 2014-06-12 ENCOUNTER — Other Ambulatory Visit: Payer: Self-pay | Admitting: Neurosurgery

## 2014-06-12 DIAGNOSIS — M5416 Radiculopathy, lumbar region: Secondary | ICD-10-CM

## 2014-06-19 DIAGNOSIS — H25813 Combined forms of age-related cataract, bilateral: Secondary | ICD-10-CM | POA: Insufficient documentation

## 2014-06-19 DIAGNOSIS — H579 Unspecified disorder of eye and adnexa: Secondary | ICD-10-CM | POA: Insufficient documentation

## 2014-07-03 ENCOUNTER — Ambulatory Visit
Admission: RE | Admit: 2014-07-03 | Discharge: 2014-07-03 | Disposition: A | Discharge status: Home / Self Care | Payer: Medicare Other | Source: Ambulatory Visit | Attending: Neurosurgery | Admitting: Neurosurgery

## 2014-07-03 DIAGNOSIS — M5416 Radiculopathy, lumbar region: Secondary | ICD-10-CM

## 2014-07-03 MED ORDER — ONDANSETRON HCL 4 MG/2ML IJ SOLN
4.0000 mg | Freq: Once | INTRAMUSCULAR | Status: AC
  Administered 2014-07-03: 4 mg via INTRAMUSCULAR

## 2014-07-03 MED ORDER — DIAZEPAM 5 MG PO TABS
5.0000 mg | ORAL_TABLET | Freq: Once | ORAL | Status: AC
  Administered 2014-07-03: 5 mg via ORAL

## 2014-07-03 MED ORDER — IOHEXOL 180 MG/ML  SOLN
15.0000 mL | Freq: Once | INTRAMUSCULAR | Status: AC | PRN
  Administered 2014-07-03: 15 mL via INTRATHECAL

## 2014-07-03 MED ORDER — MEPERIDINE HCL 100 MG/ML IJ SOLN
50.0000 mg | Freq: Once | INTRAMUSCULAR | Status: AC
  Administered 2014-07-03: 50 mg via INTRAMUSCULAR

## 2014-07-03 NOTE — Discharge Instructions (Addendum)
Myelogram Discharge Instructions  1. Go home and rest quietly for the next 24 hours.  It is important to lie flat for the next 24 hours.  Get up only to go to the restroom.  You may lie in the bed or on a couch on your back, your stomach, your left side or your right side.  You may have one pillow under your head.  You may have pillows between your knees while you are on your side or under your knees while you are on your back.  2. DO NOT drive today.  Recline the seat as far back as it will go, while still wearing your seat belt, on the way home.  3. You may get up to go to the bathroom as needed.  You may sit up for 10 minutes to eat.  You may resume your normal diet and medications unless otherwise indicated.  Drink lots of extra fluids today and tomorrow.  4. The incidence of headache, nausea, or vomiting is about 5% (one in 20 patients).  If you develop a headache, lie flat and drink plenty of fluids until the headache goes away.  Caffeinated beverages may be helpful.  If you develop severe nausea and vomiting or a headache that does not go away with flat bed rest, call [phone].  5. You may resume normal activities after your 24 hours of bed rest is over; however, do not exert yourself strongly or do any heavy lifting tomorrow. If when you get up you have a headache when standing, go back to bed and force fluids for another 24 hours.  6. Call your physician for a follow-up appointment.  The results of your myelogram will be sent directly to your physician by the following day.  7. If you have any questions or if complications develop after you arrive home, please call [phone].  Discharge instructions have been explained to the patient.  The patient, or the person responsible for the patient, fully understands these instructions.       May resume Fluoxetine on Feb. 19, 2016, after 9:30 am.

## 2014-07-03 NOTE — Progress Notes (Signed)
Pt states she has been off Fluoxetine for the past 2 days.

## 2014-07-15 DIAGNOSIS — C50911 Malignant neoplasm of unspecified site of right female breast: Secondary | ICD-10-CM

## 2014-07-15 HISTORY — DX: Malignant neoplasm of unspecified site of right female breast: C50.911

## 2014-07-18 ENCOUNTER — Other Ambulatory Visit: Payer: Self-pay | Admitting: Neurosurgery

## 2014-07-21 ENCOUNTER — Other Ambulatory Visit: Payer: Self-pay

## 2014-07-21 DIAGNOSIS — N6310 Unspecified lump in the right breast, unspecified quadrant: Secondary | ICD-10-CM

## 2014-07-24 ENCOUNTER — Other Ambulatory Visit: Payer: Self-pay | Admitting: Family Medicine

## 2014-07-24 DIAGNOSIS — N6310 Unspecified lump in the right breast, unspecified quadrant: Secondary | ICD-10-CM

## 2014-07-28 ENCOUNTER — Encounter (HOSPITAL_COMMUNITY)
Admission: RE | Admit: 2014-07-28 | Discharge: 2014-07-28 | Disposition: A | Discharge status: Home / Self Care | Payer: Medicare Other | Source: Ambulatory Visit | Attending: Neurosurgery | Admitting: Neurosurgery

## 2014-07-28 ENCOUNTER — Encounter (HOSPITAL_COMMUNITY): Payer: Self-pay

## 2014-07-28 DIAGNOSIS — Z01810 Encounter for preprocedural cardiovascular examination: Secondary | ICD-10-CM

## 2014-07-28 DIAGNOSIS — I10 Essential (primary) hypertension: Secondary | ICD-10-CM

## 2014-07-28 HISTORY — DX: Pneumonia, unspecified organism: J18.9

## 2014-07-28 HISTORY — DX: Anemia, unspecified: D64.9

## 2014-07-28 HISTORY — DX: Zoster without complications: B02.9

## 2014-07-28 LAB — TYPE AND SCREEN
ABO/RH(D): A NEG
Antibody Screen: NEGATIVE

## 2014-07-28 LAB — CBC
HCT: 38.6 % (ref 36.0–46.0)
Hemoglobin: 12.8 g/dL (ref 12.0–15.0)
MCH: 28.3 pg (ref 26.0–34.0)
MCHC: 33.2 g/dL (ref 30.0–36.0)
MCV: 85.4 fL (ref 78.0–100.0)
PLATELETS: 216 10*3/uL (ref 150–400)
RBC: 4.52 MIL/uL (ref 3.87–5.11)
RDW: 13.9 % (ref 11.5–15.5)
WBC: 7.7 10*3/uL (ref 4.0–10.5)

## 2014-07-28 LAB — BASIC METABOLIC PANEL
Anion gap: 12 (ref 5–15)
BUN: 6 mg/dL (ref 6–23)
CHLORIDE: 99 mmol/L (ref 96–112)
CO2: 28 mmol/L (ref 19–32)
Calcium: 9.5 mg/dL (ref 8.4–10.5)
Creatinine, Ser: 0.86 mg/dL (ref 0.50–1.10)
GFR calc Af Amer: 78 mL/min — ABNORMAL LOW (ref >90)
GFR, EST NON AFRICAN AMERICAN: 67 mL/min — AB (ref >90)
Glucose, Bld: 107 mg/dL — ABNORMAL HIGH (ref 70–99)
Potassium: 4.4 mmol/L (ref 3.5–5.1)
Sodium: 139 mmol/L (ref 135–145)

## 2014-07-28 NOTE — Progress Notes (Signed)
pcr ordered but not done due to patient already tx with mupirocin past 5 days.last tx 07/27/14

## 2014-07-28 NOTE — Progress Notes (Signed)
Anesthesia Chart Review:  Pt is 69 year old female scheduled for 2 level posterior lumbar fusion on 07/31/2014 with Dr. Hal Neer.   PMH: HTN, hypercholesterolemia, heart murmur (unspecified), dysrhythmia (atrial fibrillation postoperatively in the setting of hypokalemia, none since). Never smoker. BMI 34  Preoperative labs reviewed.    EKG: NSR.   Nuclear stress test 09/21/2013: 1. Negative for pharmacologic-stress induced ischemia. 2. Left ventricular ejection fraction 78%.  Echo 08/12/2012: - Left ventricle: The cavity size was normal. Wall thickness was normal. Systolic function was normal. The estimated ejection fraction was in the range of 55% to 60%. Wall motion was normal; there were no regional wall motion abnormalities. Left ventricular diastolic function parameters were normal. No previous study for comparison. - Aortic valve: Mildly calcified annulus. Trileaflet; mildly calcified leaflets. Transvalvular velocity was increased more than expected to mild degree - no clear stenosis. - Mitral valve: Mildly thickened leaflets . Mild regurgitation. - Left atrium: The atrium was mildly dilated. - Tricuspid valve: Mild regurgitation. Peak RV-RA gradient: 74mm Hg (S). - Inferior vena cava: Not visualized. - Pericardium, extracardiac: There was no pericardial effusion.  Cardiac cath 01/23/2012: 1. 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 2. Normal left ventricular systolic function. LVEDP 20 mmHg. Ejection fraction 60%   If no changes, I anticipate pt can proceed with surgery as scheduled.   Willeen Cass, FNP-BC [facility]/Anesthesiology Phone: [phone] 07/28/2014 4:22 PM

## 2014-07-28 NOTE — Pre-Procedure Instructions (Addendum)
Angela Bond  07/28/2014   Your procedure is scheduled on: Thursday, July 31, 2014  Report to [facility] Admitting at 8:00 AM.  Call this number if you have problems the morning of surgery: [phone]   Remember:   Do not eat food or drink liquids after midnight Wednesday, July 30, 2014   Take these medicines the morning of surgery with A SIP OF WATER: dexlansoprazole (DEXILANT),  fexofenadine (ALLEGRA), FLUoxetine (PROZAC), if needed:meclizine (ANTIVERT) for dizziness,  sodium chloride (OCEAN)  nasal spray for congestion  Stop taking Aspirin, vitamins, and herbal medications. Do not take any NSAIDs ie: Ibuprofen, Advil, Naproxen or any medication containing Aspirin such as meloxicam (MOBIC); stop now.   Do not wear jewelry, make-up or nail polish.  Do not wear lotions, powders, or perfumes. You may not wear deodorant.  Do not shave 48 hours prior to surgery.   Do not bring valuables to the hospital.  [facility] is not responsible for any belongings or valuables.               Contacts, dentures or bridgework may not be worn into surgery.  Leave suitcase in the car. After surgery it may be brought to your room.  For patients admitted to the hospital, discharge time is determined by your treatment team.               Patients discharged the day of surgery will not be allowed to drive home.  Name and phone number of your driver:   Special Instructions: Special Instructions:Special Instructions: [facility] - Preparing for Surgery  Before surgery, you can play an important role.  Because skin is not sterile, your skin needs to be as free of germs as possible.  You can reduce the number of germs on you skin by washing with CHG (chlorahexidine gluconate) soap before surgery.  CHG is an antiseptic cleaner which kills germs and bonds with the skin to continue killing germs even after washing.  Please DO NOT use if you have an allergy to CHG or antibacterial soaps.  If your  skin becomes reddened/irritated stop using the CHG and inform your nurse when you arrive at Short Stay.  Do not shave (including legs and underarms) for at least 48 hours prior to the first CHG shower.  You may shave your face.  Please follow these instructions carefully:   1.  Shower with CHG Soap the night before surgery and the morning of Surgery.  2.  If you choose to wash your hair, wash your hair first as usual with your normal shampoo.  3.  After you shampoo, rinse your hair and body thoroughly to remove the Shampoo.  4.  Use CHG as you would any other liquid soap.  You can apply chg directly  to the skin and wash gently with scrungie or a clean washcloth.  5.  Apply the CHG Soap to your body ONLY FROM THE NECK DOWN.  Do not use on open wounds or open sores.  Avoid contact with your eyes, ears, mouth and genitals (private parts).  Wash genitals (private parts) with your normal soap.  6.  Wash thoroughly, paying special attention to the area where your surgery will be performed.  7.  Thoroughly rinse your body with warm water from the neck down.  8.  DO NOT shower/wash with your normal soap after using and rinsing off the CHG Soap.  9.  Pat yourself dry with a clean towel.  10.  Wear clean pajamas.            11.  Place clean sheets on your bed the night of your first shower and do not sleep with pets.  Day of Surgery  Do not apply any lotions/deodorants the morning of surgery.  Please wear clean clothes to the hospital/surgery center.   Please read over the following fact sheets that you were given: Pain Booklet, Coughing and Deep Breathing, Blood Transfusion Information, MRSA Information and Surgical Site Infection Prevention

## 2014-07-30 ENCOUNTER — Ambulatory Visit
Admission: RE | Admit: 2014-07-30 | Discharge: 2014-07-30 | Disposition: A | Discharge status: Home / Self Care | Payer: Medicare Other | Source: Ambulatory Visit | Attending: Family Medicine | Admitting: Family Medicine

## 2014-07-30 ENCOUNTER — Other Ambulatory Visit: Payer: Self-pay | Admitting: Family Medicine

## 2014-07-30 DIAGNOSIS — N6310 Unspecified lump in the right breast, unspecified quadrant: Secondary | ICD-10-CM

## 2014-07-30 MED ORDER — CEFAZOLIN SODIUM-DEXTROSE 2-3 GM-% IV SOLR
2.0000 g | INTRAVENOUS | Status: AC
  Administered 2014-07-31 (×2): 2 g via INTRAVENOUS
  Filled 2014-07-30: qty 50

## 2014-07-30 MED ORDER — DEXAMETHASONE SODIUM PHOSPHATE 10 MG/ML IJ SOLN
10.0000 mg | INTRAMUSCULAR | Status: AC
  Administered 2014-07-31: 10 mg via INTRAVENOUS
  Filled 2014-07-30: qty 1

## 2014-07-31 ENCOUNTER — Inpatient Hospital Stay (HOSPITAL_COMMUNITY)
Admission: RE | Admit: 2014-07-31 | Discharge: 2014-08-05 | DRG: 460 | Disposition: A | Discharge status: Skilled Nursing Facility | Payer: Medicare Other | Source: Ambulatory Visit | Attending: Neurosurgery | Admitting: Neurosurgery

## 2014-07-31 ENCOUNTER — Inpatient Hospital Stay (HOSPITAL_COMMUNITY): Payer: Medicare Other | Admitting: Emergency Medicine

## 2014-07-31 ENCOUNTER — Encounter (HOSPITAL_COMMUNITY): Payer: Self-pay

## 2014-07-31 ENCOUNTER — Encounter (HOSPITAL_COMMUNITY)
Admission: RE | Disposition: A | Discharge status: Skilled Nursing Facility | Payer: Self-pay | Source: Ambulatory Visit | Attending: Neurosurgery

## 2014-07-31 ENCOUNTER — Inpatient Hospital Stay (HOSPITAL_COMMUNITY): Payer: Medicare Other

## 2014-07-31 ENCOUNTER — Inpatient Hospital Stay (HOSPITAL_COMMUNITY): Payer: Medicare Other | Admitting: Anesthesiology

## 2014-07-31 DIAGNOSIS — K219 Gastro-esophageal reflux disease without esophagitis: Secondary | ICD-10-CM | POA: Diagnosis present

## 2014-07-31 DIAGNOSIS — M4326 Fusion of spine, lumbar region: Secondary | ICD-10-CM

## 2014-07-31 DIAGNOSIS — M4316 Spondylolisthesis, lumbar region: Secondary | ICD-10-CM | POA: Diagnosis present

## 2014-07-31 DIAGNOSIS — M1990 Unspecified osteoarthritis, unspecified site: Secondary | ICD-10-CM | POA: Diagnosis present

## 2014-07-31 DIAGNOSIS — F419 Anxiety disorder, unspecified: Secondary | ICD-10-CM | POA: Diagnosis present

## 2014-07-31 DIAGNOSIS — Z791 Long term (current) use of non-steroidal anti-inflammatories (NSAID): Secondary | ICD-10-CM

## 2014-07-31 DIAGNOSIS — Z881 Allergy status to other antibiotic agents status: Secondary | ICD-10-CM | POA: Diagnosis not present

## 2014-07-31 DIAGNOSIS — Z96653 Presence of artificial knee joint, bilateral: Secondary | ICD-10-CM | POA: Diagnosis present

## 2014-07-31 DIAGNOSIS — Z79899 Other long term (current) drug therapy: Secondary | ICD-10-CM | POA: Diagnosis not present

## 2014-07-31 DIAGNOSIS — Z882 Allergy status to sulfonamides status: Secondary | ICD-10-CM

## 2014-07-31 DIAGNOSIS — Z888 Allergy status to other drugs, medicaments and biological substances status: Secondary | ICD-10-CM | POA: Diagnosis not present

## 2014-07-31 DIAGNOSIS — M4806 Spinal stenosis, lumbar region: Principal | ICD-10-CM | POA: Diagnosis present

## 2014-07-31 DIAGNOSIS — Z886 Allergy status to analgesic agent status: Secondary | ICD-10-CM | POA: Diagnosis not present

## 2014-07-31 DIAGNOSIS — I10 Essential (primary) hypertension: Secondary | ICD-10-CM | POA: Diagnosis present

## 2014-07-31 DIAGNOSIS — M48061 Spinal stenosis, lumbar region without neurogenic claudication: Secondary | ICD-10-CM | POA: Diagnosis present

## 2014-07-31 DIAGNOSIS — M549 Dorsalgia, unspecified: Secondary | ICD-10-CM | POA: Diagnosis present

## 2014-07-31 SURGERY — POSTERIOR LUMBAR FUSION 2 LEVEL
Anesthesia: General | Site: Back

## 2014-07-31 MED ORDER — MECLIZINE HCL 12.5 MG PO TABS: 12.5000 mg | ORAL_TABLET | Freq: Three times a day (TID) | ORAL | Status: DC | PRN

## 2014-07-31 MED ORDER — MEPERIDINE HCL 25 MG/ML IJ SOLN: 6.2500 mg | INTRAMUSCULAR | Status: DC | PRN

## 2014-07-31 MED ORDER — BISACODYL 5 MG PO TBEC: 5.0000 mg | DELAYED_RELEASE_TABLET | Freq: Every day | ORAL | Status: DC | PRN

## 2014-07-31 MED ORDER — DEXAMETHASONE SODIUM PHOSPHATE 4 MG/ML IJ SOLN
4.0000 mg | Freq: Four times a day (QID) | INTRAMUSCULAR | Status: AC
  Administered 2014-07-31: 4 mg via INTRAVENOUS
  Filled 2014-07-31: qty 1

## 2014-07-31 MED ORDER — MIDAZOLAM HCL 2 MG/2ML IJ SOLN
INTRAMUSCULAR | Status: AC
  Filled 2014-07-31: qty 2

## 2014-07-31 MED ORDER — SODIUM CHLORIDE 0.9 % IR SOLN
Status: DC | PRN
  Administered 2014-07-31: 500 mL

## 2014-07-31 MED ORDER — ALUM & MAG HYDROXIDE-SIMETH 200-200-20 MG/5ML PO SUSP
30.0000 mL | Freq: Four times a day (QID) | ORAL | Status: DC | PRN
  Administered 2014-08-01: 30 mL via ORAL
  Filled 2014-07-31: qty 30

## 2014-07-31 MED ORDER — SODIUM CHLORIDE 0.9 % IJ SOLN: 3.0000 mL | INTRAMUSCULAR | Status: DC | PRN

## 2014-07-31 MED ORDER — SODIUM CHLORIDE 0.9 % IJ SOLN
3.0000 mL | Freq: Two times a day (BID) | INTRAMUSCULAR | Status: DC
  Administered 2014-08-02 – 2014-08-05 (×5): 3 mL via INTRAVENOUS

## 2014-07-31 MED ORDER — OXYCODONE HCL 5 MG PO TABS: 5.0000 mg | ORAL_TABLET | Freq: Once | ORAL | Status: DC | PRN

## 2014-07-31 MED ORDER — OXYCODONE-ACETAMINOPHEN 5-325 MG PO TABS
1.0000 | ORAL_TABLET | ORAL | Status: DC | PRN
  Administered 2014-07-31 – 2014-08-05 (×18): 2 via ORAL
  Filled 2014-07-31 (×18): qty 2

## 2014-07-31 MED ORDER — VANCOMYCIN HCL 1000 MG IV SOLR
INTRAVENOUS | Status: AC
  Filled 2014-07-31: qty 1000

## 2014-07-31 MED ORDER — ROCURONIUM BROMIDE 50 MG/5ML IV SOLN
INTRAVENOUS | Status: AC
  Filled 2014-07-31: qty 1

## 2014-07-31 MED ORDER — BUPIVACAINE LIPOSOME 1.3 % IJ SUSP
INTRAMUSCULAR | Status: DC | PRN
  Administered 2014-07-31: 20 mL

## 2014-07-31 MED ORDER — ACETAMINOPHEN 325 MG PO TABS: 650.0000 mg | ORAL_TABLET | ORAL | Status: DC | PRN

## 2014-07-31 MED ORDER — THROMBIN 20000 UNITS EX SOLR
CUTANEOUS | Status: DC | PRN
  Administered 2014-07-31 (×2): 20 mL via TOPICAL

## 2014-07-31 MED ORDER — PROPOFOL 10 MG/ML IV BOLUS
INTRAVENOUS | Status: AC
  Filled 2014-07-31: qty 20

## 2014-07-31 MED ORDER — ONDANSETRON HCL 4 MG/2ML IJ SOLN
4.0000 mg | INTRAMUSCULAR | Status: DC | PRN
  Administered 2014-07-31 – 2014-08-03 (×2): 4 mg via INTRAVENOUS
  Filled 2014-07-31 (×2): qty 2

## 2014-07-31 MED ORDER — LIDOCAINE HCL (CARDIAC) 20 MG/ML IV SOLN
INTRAVENOUS | Status: AC
  Filled 2014-07-31: qty 5

## 2014-07-31 MED ORDER — DOCUSATE SODIUM 100 MG PO CAPS
100.0000 mg | ORAL_CAPSULE | Freq: Two times a day (BID) | ORAL | Status: DC
  Administered 2014-07-31 – 2014-08-05 (×10): 100 mg via ORAL
  Filled 2014-07-31 (×10): qty 1

## 2014-07-31 MED ORDER — HYDROMORPHONE HCL 1 MG/ML IJ SOLN
0.2500 mg | INTRAMUSCULAR | Status: DC | PRN
  Administered 2014-07-31 (×4): 0.5 mg via INTRAVENOUS

## 2014-07-31 MED ORDER — CYCLOBENZAPRINE HCL 10 MG PO TABS
10.0000 mg | ORAL_TABLET | Freq: Three times a day (TID) | ORAL | Status: DC | PRN
  Administered 2014-08-02 – 2014-08-04 (×3): 10 mg via ORAL
  Filled 2014-07-31 (×3): qty 1

## 2014-07-31 MED ORDER — VECURONIUM BROMIDE 10 MG IV SOLR
INTRAVENOUS | Status: AC
  Filled 2014-07-31: qty 10

## 2014-07-31 MED ORDER — HYDROMORPHONE HCL 1 MG/ML IJ SOLN
INTRAMUSCULAR | Status: AC
  Filled 2014-07-31: qty 1

## 2014-07-31 MED ORDER — VANCOMYCIN HCL 1000 MG IV SOLR
INTRAVENOUS | Status: DC | PRN
  Administered 2014-07-31: 1000 mg

## 2014-07-31 MED ORDER — MENTHOL 3 MG MT LOZG: 1.0000 | LOZENGE | OROMUCOSAL | Status: DC | PRN

## 2014-07-31 MED ORDER — MIDAZOLAM HCL 5 MG/5ML IJ SOLN
INTRAMUSCULAR | Status: DC | PRN
  Administered 2014-07-31: 2 mg via INTRAVENOUS

## 2014-07-31 MED ORDER — GLYCOPYRROLATE 0.2 MG/ML IJ SOLN
INTRAMUSCULAR | Status: DC | PRN
  Administered 2014-07-31: 0.6 mg via INTRAVENOUS

## 2014-07-31 MED ORDER — BUPIVACAINE LIPOSOME 1.3 % IJ SUSP
20.0000 mL | Freq: Once | INTRAMUSCULAR | Status: DC
  Filled 2014-07-31: qty 20

## 2014-07-31 MED ORDER — PROPOFOL 10 MG/ML IV BOLUS
INTRAVENOUS | Status: DC | PRN
  Administered 2014-07-31: 150 mg via INTRAVENOUS

## 2014-07-31 MED ORDER — ONDANSETRON HCL 4 MG/2ML IJ SOLN
INTRAMUSCULAR | Status: AC
  Filled 2014-07-31: qty 2

## 2014-07-31 MED ORDER — 0.9 % SODIUM CHLORIDE (POUR BTL) OPTIME
TOPICAL | Status: DC | PRN
  Administered 2014-07-31: 1000 mL

## 2014-07-31 MED ORDER — DEXAMETHASONE 4 MG PO TABS
4.0000 mg | ORAL_TABLET | Freq: Four times a day (QID) | ORAL | Status: AC
  Administered 2014-08-01: 4 mg via ORAL
  Filled 2014-07-31: qty 1

## 2014-07-31 MED ORDER — PANTOPRAZOLE SODIUM 40 MG IV SOLR
40.0000 mg | Freq: Every day | INTRAVENOUS | Status: DC
Start: 2014-07-31 — End: 2014-08-01
  Administered 2014-07-31: 40 mg via INTRAVENOUS
  Filled 2014-07-31: qty 40

## 2014-07-31 MED ORDER — VECURONIUM BROMIDE 10 MG IV SOLR
INTRAVENOUS | Status: DC | PRN
  Administered 2014-07-31: 2 mg via INTRAVENOUS

## 2014-07-31 MED ORDER — STERILE WATER FOR INJECTION IJ SOLN
INTRAMUSCULAR | Status: AC
  Filled 2014-07-31: qty 10

## 2014-07-31 MED ORDER — LIDOCAINE HCL (CARDIAC) 20 MG/ML IV SOLN
INTRAVENOUS | Status: DC | PRN
  Administered 2014-07-31: 60 mg via INTRAVENOUS

## 2014-07-31 MED ORDER — SODIUM CHLORIDE 0.9 % IJ SOLN
INTRAMUSCULAR | Status: AC
  Filled 2014-07-31: qty 10

## 2014-07-31 MED ORDER — FENTANYL CITRATE 0.05 MG/ML IJ SOLN
INTRAMUSCULAR | Status: DC | PRN
  Administered 2014-07-31 (×7): 50 ug via INTRAVENOUS
  Administered 2014-07-31: 100 ug via INTRAVENOUS
  Administered 2014-07-31: 50 ug via INTRAVENOUS

## 2014-07-31 MED ORDER — LACTATED RINGERS IV SOLN
INTRAVENOUS | Status: DC
  Administered 2014-07-31 (×2): via INTRAVENOUS

## 2014-07-31 MED ORDER — ONDANSETRON HCL 4 MG/2ML IJ SOLN
4.0000 mg | Freq: Once | INTRAMUSCULAR | Status: AC | PRN
  Administered 2014-07-31: 4 mg via INTRAVENOUS

## 2014-07-31 MED ORDER — FENTANYL CITRATE 0.05 MG/ML IJ SOLN
INTRAMUSCULAR | Status: AC
  Filled 2014-07-31: qty 5

## 2014-07-31 MED ORDER — EPHEDRINE SULFATE 50 MG/ML IJ SOLN
INTRAMUSCULAR | Status: AC
  Filled 2014-07-31: qty 1

## 2014-07-31 MED ORDER — ROCURONIUM BROMIDE 100 MG/10ML IV SOLN
INTRAVENOUS | Status: DC | PRN
  Administered 2014-07-31: 30 mg via INTRAVENOUS
  Administered 2014-07-31: 50 mg via INTRAVENOUS
  Administered 2014-07-31: 20 mg via INTRAVENOUS

## 2014-07-31 MED ORDER — NEOSTIGMINE METHYLSULFATE 10 MG/10ML IV SOLN
INTRAVENOUS | Status: DC | PRN
  Administered 2014-07-31: 4 mg via INTRAVENOUS

## 2014-07-31 MED ORDER — ACETAMINOPHEN 650 MG RE SUPP: 650.0000 mg | RECTAL | Status: DC | PRN

## 2014-07-31 MED ORDER — CEFAZOLIN SODIUM-DEXTROSE 2-3 GM-% IV SOLR
2.0000 g | Freq: Three times a day (TID) | INTRAVENOUS | Status: AC
  Administered 2014-07-31 – 2014-08-01 (×2): 2 g via INTRAVENOUS
  Filled 2014-07-31 (×2): qty 50

## 2014-07-31 MED ORDER — MAGNESIUM CITRATE PO SOLN
1.0000 | Freq: Once | ORAL | Status: AC | PRN
  Filled 2014-07-31: qty 296

## 2014-07-31 MED ORDER — SODIUM CHLORIDE 0.9 % IV SOLN: 250.0000 mL | INTRAVENOUS | Status: DC

## 2014-07-31 MED ORDER — SODIUM CHLORIDE 0.9 % IV SOLN
10.0000 mg | INTRAVENOUS | Status: DC | PRN
  Administered 2014-07-31: 10 ug/min via INTRAVENOUS

## 2014-07-31 MED ORDER — HYDROMORPHONE HCL 1 MG/ML IJ SOLN
1.0000 mg | INTRAMUSCULAR | Status: DC | PRN
  Administered 2014-07-31 – 2014-08-05 (×9): 1 mg via INTRAMUSCULAR
  Filled 2014-07-31 (×9): qty 1

## 2014-07-31 MED ORDER — GLYCOPYRROLATE 0.2 MG/ML IJ SOLN
INTRAMUSCULAR | Status: AC
  Filled 2014-07-31: qty 3

## 2014-07-31 MED ORDER — KCL IN DEXTROSE-NACL 20-5-0.45 MEQ/L-%-% IV SOLN
80.0000 mL/h | INTRAVENOUS | Status: DC
  Administered 2014-07-31 – 2014-08-01 (×2): 80 mL/h via INTRAVENOUS
  Filled 2014-07-31 (×3): qty 1000

## 2014-07-31 MED ORDER — PHENOL 1.4 % MT LIQD: 1.0000 | OROMUCOSAL | Status: DC | PRN

## 2014-07-31 MED ORDER — SENNOSIDES-DOCUSATE SODIUM 8.6-50 MG PO TABS: 1.0000 | ORAL_TABLET | Freq: Every evening | ORAL | Status: DC | PRN

## 2014-07-31 MED ORDER — EPHEDRINE SULFATE 50 MG/ML IJ SOLN
INTRAMUSCULAR | Status: DC | PRN
  Administered 2014-07-31 (×4): 10 mg via INTRAVENOUS

## 2014-07-31 MED ORDER — CEFAZOLIN SODIUM-DEXTROSE 2-3 GM-% IV SOLR
INTRAVENOUS | Status: AC
  Filled 2014-07-31: qty 50

## 2014-07-31 MED ORDER — FLUOXETINE HCL 20 MG PO CAPS
20.0000 mg | ORAL_CAPSULE | Freq: Every day | ORAL | Status: DC
  Administered 2014-08-01 – 2014-08-05 (×5): 20 mg via ORAL
  Filled 2014-07-31 (×5): qty 1

## 2014-07-31 MED ORDER — ONDANSETRON HCL 4 MG/2ML IJ SOLN
INTRAMUSCULAR | Status: DC | PRN
  Administered 2014-07-31: 4 mg via INTRAVENOUS

## 2014-07-31 MED ORDER — ALPRAZOLAM 0.25 MG PO TABS
0.2500 mg | ORAL_TABLET | Freq: Every evening | ORAL | Status: DC | PRN
  Administered 2014-08-01 – 2014-08-03 (×3): 0.25 mg via ORAL
  Filled 2014-07-31 (×3): qty 1

## 2014-07-31 MED ORDER — OXYCODONE HCL 5 MG/5ML PO SOLN: 5.0000 mg | Freq: Once | ORAL | Status: DC | PRN

## 2014-07-31 MED ORDER — DEXAMETHASONE SODIUM PHOSPHATE 10 MG/ML IJ SOLN
INTRAMUSCULAR | Status: AC
  Filled 2014-07-31: qty 1

## 2014-07-31 SURGICAL SUPPLY — 65 items
APL SKNCLS STERI-STRIP NONHPOA (GAUZE/BANDAGES/DRESSINGS) ×1
BAG DECANTER FOR FLEXI CONT (MISCELLANEOUS) ×2 IMPLANT
BENZOIN TINCTURE PRP APPL 2/3 (GAUZE/BANDAGES/DRESSINGS) ×3 IMPLANT
BLADE CLIPPER SURG (BLADE) ×2 IMPLANT
BONE EQUIVA 10CC (Bone Implant) ×1 IMPLANT
BRUSH SCRUB EZ PLAIN DRY (MISCELLANEOUS) ×2 IMPLANT
BUR CUTTER 7.0 ROUND (BURR) ×3 IMPLANT
BUR MATCHSTICK NEURO 3.0 LAGG (BURR) ×2 IMPLANT
CAGE TLIF ARDIS 9X9X22 (Cage) ×2 IMPLANT
CANISTER SUCT 3000ML PPV (MISCELLANEOUS) ×2 IMPLANT
CONT SPEC 4OZ CLIKSEAL STRL BL (MISCELLANEOUS) ×4 IMPLANT
COVER BACK TABLE 60X90IN (DRAPES) ×2 IMPLANT
DRAPE C-ARM 42X72 X-RAY (DRAPES) ×2 IMPLANT
DRAPE C-ARMOR (DRAPES) ×2 IMPLANT
DRAPE LAPAROTOMY 100X72X124 (DRAPES) ×2 IMPLANT
DRAPE SURG 17X23 STRL (DRAPES) ×4 IMPLANT
DRSG OPSITE POSTOP 4X6 (GAUZE/BANDAGES/DRESSINGS) ×2 IMPLANT
DRSG TELFA 3X8 NADH (GAUZE/BANDAGES/DRESSINGS) ×2 IMPLANT
DURAPREP 26ML APPLICATOR (WOUND CARE) ×2 IMPLANT
ELECT REM PT RETURN 9FT ADLT (ELECTROSURGICAL) ×2
ELECTRODE REM PT RTRN 9FT ADLT (ELECTROSURGICAL) ×1 IMPLANT
EVACUATOR 1/8 PVC DRAIN (DRAIN) ×2 IMPLANT
GAUZE SPONGE 4X4 12PLY STRL (GAUZE/BANDAGES/DRESSINGS) ×2 IMPLANT
GAUZE SPONGE 4X4 16PLY XRAY LF (GAUZE/BANDAGES/DRESSINGS) ×1 IMPLANT
GLOVE ECLIPSE 7.5 STRL STRAW (GLOVE) ×2 IMPLANT
GLOVE ECLIPSE 8.0 STRL XLNG CF (GLOVE) ×4 IMPLANT
GLOVE INDICATOR 7.5 STRL GRN (GLOVE) ×1 IMPLANT
GOWN STRL REUS W/ TWL LRG LVL3 (GOWN DISPOSABLE) IMPLANT
GOWN STRL REUS W/ TWL XL LVL3 (GOWN DISPOSABLE) ×2 IMPLANT
GOWN STRL REUS W/TWL 2XL LVL3 (GOWN DISPOSABLE) IMPLANT
GOWN STRL REUS W/TWL LRG LVL3 (GOWN DISPOSABLE) ×2
GOWN STRL REUS W/TWL XL LVL3 (GOWN DISPOSABLE) ×6
HANDLE PEDIGUARD CANNULATED (INSTRUMENTS) ×1 IMPLANT
IMPLANT ARDIS PEEK 10X9X26 (Orthopedic Implant) ×2 IMPLANT
K-WIRE NITHNOL TROCAR TIP (WIRE) ×6 IMPLANT
KIT BASIN OR (CUSTOM PROCEDURE TRAY) ×2 IMPLANT
KIT ROOM TURNOVER OR (KITS) ×2 IMPLANT
LIQUID BAND (GAUZE/BANDAGES/DRESSINGS) IMPLANT
NEEDLE 1 PEDIGUARD CANNULATED (NEEDLE) ×2 IMPLANT
NEEDLE HYPO 22GX1.5 SAFETY (NEEDLE) ×2 IMPLANT
NS IRRIG 1000ML POUR BTL (IV SOLUTION) ×2 IMPLANT
PACK LAMINECTOMY NEURO (CUSTOM PROCEDURE TRAY) ×2 IMPLANT
PAD ARMBOARD 7.5X6 YLW CONV (MISCELLANEOUS) ×6 IMPLANT
PAD DRESSING TELFA 3X8 NADH (GAUZE/BANDAGES/DRESSINGS) ×1 IMPLANT
PATTIES SURGICAL .75X.75 (GAUZE/BANDAGES/DRESSINGS) ×2 IMPLANT
ROD PRE BENT PERC 60MM (Rod) ×2 IMPLANT
SCREW MIN INVASIVE 6.5X35 (Screw) ×2 IMPLANT
SCREW POLYAXIA MIS 6.5X40MM (Screw) ×4 IMPLANT
SHEATH PAT (SHEATH) ×1 IMPLANT
SPONGE LAP 4X18 X RAY DECT (DISPOSABLE) IMPLANT
SPONGE SURGIFOAM ABS GEL 100 (HEMOSTASIS) ×2 IMPLANT
STRIP CLOSURE SKIN 1/2X4 (GAUZE/BANDAGES/DRESSINGS) ×4 IMPLANT
SUT PROLENE 0 CT 1 30 (SUTURE) IMPLANT
SUT VIC AB 0 CT1 18XCR BRD8 (SUTURE) ×1 IMPLANT
SUT VIC AB 0 CT1 8-18 (SUTURE) ×2
SUT VIC AB 2-0 OS6 18 (SUTURE) ×6 IMPLANT
SUT VIC AB 3-0 CP2 18 (SUTURE) ×2 IMPLANT
SYR 20ML ECCENTRIC (SYRINGE) ×2 IMPLANT
TAPE STRIPS DRAPE STRL (GAUZE/BANDAGES/DRESSINGS) ×1 IMPLANT
TOP CLSR SEQUOIA (Orthopedic Implant) ×6 IMPLANT
TOWEL OR 17X24 6PK STRL BLUE (TOWEL DISPOSABLE) ×2 IMPLANT
TOWEL OR 17X26 10 PK STRL BLUE (TOWEL DISPOSABLE) ×2 IMPLANT
TRAP SPECIMEN MUCOUS 40CC (MISCELLANEOUS) IMPLANT
TRAY FOLEY CATH 14FRSI W/METER (CATHETERS) ×2 IMPLANT
WATER STERILE IRR 1000ML POUR (IV SOLUTION) ×2 IMPLANT

## 2014-07-31 NOTE — Progress Notes (Signed)
Pt still drowsy post op. C/o pain in lower back Medicated total of 2mg  Dilaudid IV for pain and Zofran 4 mg IV and nausea in PACU. At discharge patient states 10/10 pain. Faces 6/10  Body relaxed and resting between checks.

## 2014-07-31 NOTE — H&P (Signed)
Angela Bond is an 69 y.o. female.   Chief Complaint: Back pain into the legs HPI: The patient is a 69 year old female who is evaluated in the office for back pain down the legs which started in April 2015. She fell at that time the pain started shortly thereafter. 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 MRI scan was eventually done which showed mild abnormalities. She was lost to follow-up for a number of months but then returned early this year increasing difficulty. It was elected to proceed with myelography which showed marked abnormalities with a mobile listhesis and marked stenosis at L4-5 and mobile listhesis at L5-S1. At that time the options were discussed the patient requested surgery now she comes for a two-level posterior lumbar interbody fusion with pedicle screw fixation. I had a long discussion with her regarding the risks and benefits of surgical intervention. The risks discussed include but are not limited to bleeding infection weakness numbness paralysis spinal fluid leak trouble with instrumentation nonunion coma and death. We've discussed alternative methods of therapy although risks and benefits of nonintervention. She's had the opportunity to ask numerous questions and appears to understand. With this information in hand she has requested we proceed with surgery.  Past Medical History  Diagnosis Date  . Arthritis   . Hypertension   . GERD (gastroesophageal reflux disease)   . Hypercholesteremia   . Heart murmur   . Anxiety and depression   . H/O hiatal hernia   . Headache(784.0)     sinus  . Dysrhythmia   . Shortness of breath     "stomach is in lungs" when had hernia 2014 none now(07/28/14)  . Pneumonia     hx  . Depression   . Anxiety   . Anemia     hx  . Shingles 5/15    hx    Past Surgical History  Procedure Laterality Date  . Total knee arthroplasty Bilateral   . Esophageal manometry N/A 07/02/2012    Procedure: ESOPHAGEAL MANOMETRY (EM);   Surgeon: Lear Ng, MD;  Location: [facility];  Service: Endoscopy;  Laterality: N/A;  schooler to read  . Joint replacement Bilateral 2011,2012    knees  . Breast surgery      r breast biopsy  . Tonsillectomy  as child  . Hiatal hernia repair N/A 08/10/2012    Procedure: LAPAROSCOPIC REPAIR OF HIATAL HERNIA WITH MESH AND EGD WITH PEG TUBE PLACEMENT;  Surgeon: Ralene Ok, MD;  Location: [facility];  Service: General;  Laterality: N/A;  . Eye surgery  2013    tube placed Left ,tear duct    Family History  Problem Relation Age of Onset  . Heart disease Mother     60s, CHF  . Heart disease Father     d/o MI at 17  . Cancer Father     skin  . Cancer Sister     breast  . Heart disease Sister     CABG in mid-60s   Social History:  reports that she has never smoked. She has never used smokeless tobacco. She reports that she does not drink alcohol or use illicit drugs.  Allergies:  Allergies  Allergen Reactions  . Lactose Intolerance (Gi) Other (See Comments)  . Aleve [Naproxen] Nausea And Vomiting  . Aspirin Other (See Comments)    Burns stomach  . Ciprofloxacin Nausea And Vomiting  . Doxycycline Nausea And Vomiting  . Evista [Raloxifene] Other (See Comments)    Leg  aches  . Fosamax [Alendronate Sodium] Other (See Comments)    Leg aches  . Pravachol [Pravastatin] Other (See Comments)    GI upset   . Sulfa Antibiotics Nausea Only  . Welchol [Colesevelam Hcl] Other (See Comments)    Stomach cramps  . Zocor [Simvastatin] Rash    Medications Prior to Admission  Medication Sig Dispense Refill  . ALPRAZolam (XANAX) 0.25 MG tablet Take 0.25 mg by mouth at bedtime as needed for sleep or anxiety.     . calcium carbonate (OS-CAL) 600 MG TABS Take 600 mg by mouth 2 (two) times daily with a meal.    . carboxymethylcellulose (REFRESH PLUS) 0.5 % SOLN 1 drop 4 (four) times daily.     . Cholecalciferol (VITAMIN D) 2000 UNITS tablet Take 2,000 Units by mouth 2 (two) times  daily.     Marland Kitchen dexlansoprazole (DEXILANT) 60 MG capsule Take 60 mg by mouth daily.    . fexofenadine (ALLEGRA) 180 MG tablet Take 180 mg by mouth daily.    Marland Kitchen FLUoxetine (PROZAC) 20 MG capsule Take 20 mg by mouth daily.  11  . lovastatin (MEVACOR) 40 MG tablet Take 40 mg by mouth daily before breakfast.     . meclizine (ANTIVERT) 25 MG tablet Take 12.5-25 mg by mouth 3 (three) times daily as needed for dizziness.    . meloxicam (MOBIC) 15 MG tablet TAKE 1 TABLET (15 MG TOTAL) BY MOUTH DAILY. 30 tablet 3  . sodium chloride (OCEAN) 0.65 % SOLN nasal spray Place 2 sprays into both nostrils as needed for congestion.    Marland Kitchen HYDROcodone-acetaminophen (NORCO) 5-325 MG per tablet Take 1 tablet by mouth every 4 (four) hours as needed for moderate pain. (Patient not taking: Reported on 07/24/2014) 30 tablet 0  . HYDROcodone-acetaminophen (NORCO/VICODIN) 5-325 MG per tablet Take 1 tablet by mouth every 6 (six) hours as needed for moderate pain or severe pain. (Patient not taking: Reported on 07/24/2014) 3 tablet 0  . polyethylene glycol (MIRALAX / GLYCOLAX) packet Take 17 g by mouth as needed.    . valACYclovir (VALTREX) 1000 MG tablet Take 1 tablet (1,000 mg total) by mouth 3 (three) times daily. (Patient not taking: Reported on 07/24/2014) 21 tablet 0    No results found for this or any previous visit (from the past 48 hour(s)). US Breast Ltd Uni Right Inc Axilla  07/30/2014   CLINICAL DATA:  Questioned palpable finding right upper outer quadrant. The patient is having back surgery tomorrow.  EXAM: DIGITAL DIAGNOSTIC bilateral MAMMOGRAM WITH 3D TOMOSYNTHESIS WITH CAD  ULTRASOUND right BREAST  COMPARISON:  The most recent prior exam is dated 05/22/2012  ACR Breast Density Category b: There are scattered areas of fibroglandular density.  FINDINGS: There is a spiculated 2 cm irregular mass in the right breast upper outer quadrant corresponding to the palpable finding, with internal fine pleomorphic calcifications. Round  scattered calcifications are noted elsewhere in both breasts, unchanged. No new abnormality is identified in the left breast.  Mammographic images were processed with CAD.  On physical exam, there is a palpable hard mass right breast upper outer quadrant.  Targeted ultrasound is performed, showing an irregular spiculated shadowing hypoechoic mass in the right breast 10 o'clock location 4 cm from the nipple measuring 1.7 x 1.3 x 1.3 cm. This corresponds to the palpable and mammographic finding. In the right axilla, there are several lymph nodes which are normal in size measuring up to 4 mm in maximal short axis diameter, but with  mild cortical ovulation. Compared to the contralateral axilla, this is not significantly different.  IMPRESSION: Suspicious right breast mass 10 o'clock location. Ultrasound-guided core biopsy is deferred until after the patient recovers from back surgery to be performed tomorrow and will be scheduled at her convenience for 4 weeks from now.  No evidence for malignancy in the left breast.  RECOMMENDATION: Right ultrasound-guided core biopsy  I have discussed the findings and recommendations with the patient. Results were also provided in writing at the conclusion of the visit. If applicable, a reminder letter will be sent to the patient regarding the next appointment.  BI-RADS CATEGORY  5: Highly suggestive of malignancy.   Electronically Signed   By: Conchita Paris M.D.   On: 07/30/2014 10:46   Mm Diag Breast Tomo Bilateral  07/30/2014   CLINICAL DATA:  Questioned palpable finding right upper outer quadrant. The patient is having back surgery tomorrow.  EXAM: DIGITAL DIAGNOSTIC bilateral MAMMOGRAM WITH 3D TOMOSYNTHESIS WITH CAD  ULTRASOUND right BREAST  COMPARISON:  The most recent prior exam is dated 05/22/2012  ACR Breast Density Category b: There are scattered areas of fibroglandular density.  FINDINGS: There is a spiculated 2 cm irregular mass in the right breast upper outer  quadrant corresponding to the palpable finding, with internal fine pleomorphic calcifications. Round scattered calcifications are noted elsewhere in both breasts, unchanged. No new abnormality is identified in the left breast.  Mammographic images were processed with CAD.  On physical exam, there is a palpable hard mass right breast upper outer quadrant.  Targeted ultrasound is performed, showing an irregular spiculated shadowing hypoechoic mass in the right breast 10 o'clock location 4 cm from the nipple measuring 1.7 x 1.3 x 1.3 cm. This corresponds to the palpable and mammographic finding. In the right axilla, there are several lymph nodes which are normal in size measuring up to 4 mm in maximal short axis diameter, but with mild cortical ovulation. Compared to the contralateral axilla, this is not significantly different.  IMPRESSION: Suspicious right breast mass 10 o'clock location. Ultrasound-guided core biopsy is deferred until after the patient recovers from back surgery to be performed tomorrow and will be scheduled at her convenience for 4 weeks from now.  No evidence for malignancy in the left breast.  RECOMMENDATION: Right ultrasound-guided core biopsy  I have discussed the findings and recommendations with the patient. Results were also provided in writing at the conclusion of the visit. If applicable, a reminder letter will be sent to the patient regarding the next appointment.  BI-RADS CATEGORY  5: Highly suggestive of malignancy.   Electronically Signed   By: Conchita Paris M.D.   On: 07/30/2014 10:46    Positive for back and leg pain high cholesterol anxiety and depression  Blood pressure 142/52, pulse 65, temperature 98.5 F (36.9 C), temperature source Oral, resp. rate 17, height 5' (1.524 m), weight 78.926 kg (174 lb), SpO2 98 %.  Patient is awake alert and oriented. She has no facial asymmetry. Reflexes are decreased but equal. She has mild weakness of extensor pollicis longus for  sensation is intact Assessment/Plan Impression is that of stenosis and listhesis L4-5 L5-S1. The plan is for a two-level interbody fusion with pedicle screw fixation.  Faythe Ghee, MD 07/31/2014, 10:01 AM

## 2014-07-31 NOTE — Anesthesia Postprocedure Evaluation (Signed)
  Anesthesia Post-op Note  Patient: Angela Bond  Procedure(s) Performed: Procedure(s) with comments: POSTERIOR LUMBAR FUSION 2 LEVEL (N/A) - POSTERIOR LUMBAR FUSION 2 LEVEL LUMBAR 4-5,5-S1  Patient Location: PACU  Anesthesia Type:General  Level of Consciousness: awake, alert  and oriented  Airway and Oxygen Therapy: Patient Spontanous Breathing  Post-op Pain: 3 /10  Post-op Assessment: Post-op Vital signs reviewed, Patient's Cardiovascular Status Stable, Respiratory Function Stable, Patent Airway and No signs of Nausea or vomiting  Post-op Vital Signs: Reviewed and stable  Last Vitals:  Filed Vitals:   07/31/14 1645  BP: 138/73  Pulse: 101  Temp: 36.8 C  Resp: 16    Complications: No apparent anesthesia complications

## 2014-07-31 NOTE — Transfer of Care (Signed)
Immediate Anesthesia Transfer of Care Note  Patient: Angela Bond  Procedure(s) Performed: Procedure(s) with comments: POSTERIOR LUMBAR FUSION 2 LEVEL (N/A) - POSTERIOR LUMBAR FUSION 2 LEVEL LUMBAR 4-5,5-S1  Patient Location: PACU  Anesthesia Type:General  Level of Consciousness: awake, alert , oriented and patient cooperative  Airway & Oxygen Therapy: Patient Spontanous Breathing and Patient connected to nasal cannula oxygen  Post-op Assessment: Report given to RN and Post -op Vital signs reviewed and stable  Post vital signs: Reviewed and stable  Last Vitals:  Filed Vitals:   07/31/14 0835  BP: 142/52  Pulse: 65  Temp: 36.9 C  Resp: 17    Complications: No apparent anesthesia complications

## 2014-07-31 NOTE — Anesthesia Procedure Notes (Signed)
Procedure Name: Intubation Date/Time: 07/31/2014 10:12 AM Performed by: Rush Farmer E Pre-anesthesia Checklist: Patient identified, Emergency Drugs available, Suction available, Patient being monitored and Timeout performed Patient Re-evaluated:Patient Re-evaluated prior to inductionOxygen Delivery Method: Circle system utilized Preoxygenation: Pre-oxygenation with 100% oxygen Intubation Type: IV induction Ventilation: Mask ventilation without difficulty and Oral airway inserted - appropriate to patient size Laryngoscope Size: Mac and 3 Grade View: Grade I Tube type: Oral Tube size: 7.0 mm Number of attempts: 1 Airway Equipment and Method: Stylet Placement Confirmation: ETT inserted through vocal cords under direct vision,  positive ETCO2 and breath sounds checked- equal and bilateral Secured at: 20 cm Tube secured with: Tape Dental Injury: Teeth and Oropharynx as per pre-operative assessment

## 2014-07-31 NOTE — Anesthesia Preprocedure Evaluation (Addendum)
Anesthesia Evaluation  Patient identified by MRN, date of birth, ID band Patient awake    Reviewed: Allergy & Precautions, H&P , NPO status , Patient's Chart, lab work & pertinent test results  Airway Mallampati: II  TM Distance: >3 FB Neck ROM: Full    Dental no notable dental hx. (+) Edentulous Upper, Edentulous Lower   Pulmonary neg pulmonary ROS,  breath sounds clear to auscultation  Pulmonary exam normal       Cardiovascular hypertension, Pt. on medications - angina+ dysrhythmias (a.fib in the past due to hypokalemia) Rhythm:Regular Rate:Normal  Nuclear stress test 09/21/2013: 1. Negative for pharmacologic-stress induced ischemia. 2. Left ventricular ejection fraction 78%.  Echo 08/12/2012: - Left ventricle: The cavity size was normal. Wall thickness was normal. Systolic function was normal. The estimated ejection fraction was in the range of 55% to 60%. Wall motion was normal; there were no regional wall motion abnormalities. Left ventricular diastolic function parameters were normal. No previous study for comparison. - Aortic valve: Mildly calcified annulus. Trileaflet; mildly calcified leaflets. Transvalvular velocity was increased more than expected to mild degree - no clear stenosis. - Mitral valve: Mildly thickened leaflets . Mild regurgitation. - Left atrium: The atrium was mildly dilated. - Tricuspid valve: Mild regurgitation. Peak RV-RA gradient: 45mm Hg (S). - Inferior vena cava: Not visualized. - Pericardium, extracardiac: There was no pericardial effusion.  Cardiac cath 01/23/2012: 1.   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 2.   Normal left ventricular systolic function. LVEDP 20 mmHg. Ejection fraction 60%     Neuro/Psych PSYCHIATRIC DISORDERS Anxiety Depression  Neuromuscular disease negative psych ROS   GI/Hepatic Neg liver ROS, hiatal hernia, GERD-  ,   Endo/Other  Morbid obesity  Renal/GU negative Renal ROS  negative genitourinary   Musculoskeletal negative musculoskeletal ROS (+) Arthritis -, Osteoarthritis,    Abdominal   Peds negative pediatric ROS (+)  Hematology negative hematology ROS (+)   Anesthesia Other Findings   Reproductive/Obstetrics negative OB ROS                            Anesthesia Physical  Anesthesia Plan  ASA: III  Anesthesia Plan: General   Post-op Pain Management:    Induction: Intravenous  Airway Management Planned: Oral ETT  Additional Equipment:   Intra-op Plan:   Post-operative Plan: Extubation in OR  Informed Consent: I have reviewed the patients History and Physical, chart, labs and discussed the procedure including the risks, benefits and alternatives for the proposed anesthesia with the patient or authorized representative who has indicated his/her understanding and acceptance.     Plan Discussed with: CRNA  Anesthesia Plan Comments: (Grade 1; CRNA; Endotracheal Tube; MAC, 4; Oral; 7.5 mm; Min.occ.pres., Air, Cuffed; 1; Stylet; Direct Visualization, ETCO2 (Capnography), Chest Rise, Bilateral Breath Sounds (RSI); 21 cm)        Anesthesia Quick Evaluation

## 2014-07-31 NOTE — Progress Notes (Signed)
Pt received from PACU alert, verbal, and drowsy. Stable, neuro intact. No noted distress on 2L via n/c. She denied shortness of breath or dyspnea 02 sat 98%. IV infusing with LR to left forearm. Surgical honeycomb dressing dry and intact noted a scant amount of dry blood. Will continue to monitor. Foley patent draining clear, yellow urine. Pt has not ambulated but is able to move all extremities. SCD's on. Pt oriented to room. Safety measures in place. Husband at bedside. Call bell within reach.

## 2014-07-31 NOTE — Op Note (Signed)
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 Localizing x-rays taken prior to incision to identify the appropriate level. Midline incision was made above the spinous processes of L4-L5 and S1. Using Bovie cutting current the incision was carried on the dorsal lumbar fascia and the plane between the fascia and the tissue superficial to it was dissected free. Subperiosteal dissection was then carried out bilaterally on the spinous processes lamina facet joint subcutaneous tract was placed for exposure. X-ray showed approach the appropriate levels. Spinous processes of L4-L5 and S1 removed. Patient's left side generous laminotomy was performed at L4-5 and L5-S1 by removing the inferior two thirds of the superior lamina the medial two thirds the facet joint and the superior one third of the inferior lamina. Residual bone and hypertrophic ligamentum flavum removed in a piecemeal fashion. Compression was carried on the opposite side and then residual midline structures were removed to complete the bilateral decompression. Thorough decompression was carried out to relieve the midline and lateral recess stenosis more so than needed for interbody fusion. The disc space was then identified bilaterally at L4-5 and L5-S1 and thoroughly cleaned out with pituitary rongeurs and curettes. We then prepared the  disc space for interbody fusion at both levels. At L5-S1 we distracted up to a 9 mm size and L4-5 we distracted up to a 10 mm size this was felt to be good choice is. At L4-5 we used 10 x 9 x 26 mm cages at L5-S1 we used a 9 x 9 x 22 mm cages. These were filled with a mixture of autologous bone and morselized allograft. For spacer would be placed standard fashion the distractor would be removed. Then placed a mixture of autologous bone and morselized allograft it within the interspaces to help with the interbody fusion. Then placed the second spacer on the opposite side and followed into good position under fluoroscopy. We then irrigated copiously decorticated the far lateral region placed a mixture of autologous bone and morselized allograft on the facet joint for posterolateral fusion. We then closed the fascia in standard fashion. We placed percutaneous pedicle screws at L4-L5 and S1 through the fascia in standard fashion. Placed a Jamshidi needle and placed a guidewire. We used the ultrasonic guided Jamshidi needle to help with placement. We tapped with a 6 mm tap and placed 6.5 mm x 40 mm screws at L4 and L5 6.5 mm x 35 mm screws at S1. Then chose appropriate length rod secured them to the top of the screws to tighten and final tightening with torque and counter torque on the towers then removed the [place] without difficulty. Arthroscopy and AP lateral direction looked excellent. We then closed the opening the fascia with 0 Vicryl then closed the wound in multiple layers of Vicryl on the subcutaneous and subcuticular tissues. Running locking Prolene was placed on the skin. Shortness was then applied the patient was extubated and taken to recovery room in stable condition.

## 2014-08-01 MED ORDER — POLYETHYLENE GLYCOL 3350 17 G PO PACK
17.0000 g | PACK | Freq: Two times a day (BID) | ORAL | Status: DC
  Administered 2014-08-01 – 2014-08-05 (×8): 17 g via ORAL
  Filled 2014-08-01 (×8): qty 1

## 2014-08-01 MED ORDER — PANTOPRAZOLE SODIUM 40 MG PO TBEC
40.0000 mg | DELAYED_RELEASE_TABLET | Freq: Every day | ORAL | Status: DC
  Administered 2014-08-01 – 2014-08-05 (×5): 40 mg via ORAL
  Filled 2014-08-01 (×5): qty 1

## 2014-08-01 NOTE — Progress Notes (Signed)
Patient ID: Angela Bond, female   DOB: 1945/10/02, 69 y.o.   MRN: [id number] Afeb, vss No new neuro issues She still feels a lot of leg pain, not sure why. Will slowly increase activity. I thnk she may need a SNF at d/c, so will get case manager to start working on that.

## 2014-08-01 NOTE — Evaluation (Signed)
Physical Therapy Evaluation Patient Details Name: DENAY PLEITEZ MRN: [id number] DOB: [birth year] Today's Date: 08/01/2014   History of Present Illness  s/p POSTERIOR LUMBAR FUSION 2 LEVEL LUMBAR 4-5,5-S1  Clinical Impression  Patient is s/p above surgery resulting in the deficits listed below (see PT Problem List). Patient is very anxious and was limited by pain and anxiety this date. She does not feel her husband can provide the assistance she needs on discharge. Anticipate slower progress due to anxiety.  Patient will benefit from skilled PT to increase their independence and safety with mobility (while adhering to their precautions) to allow discharge to the venue listed below.     Follow Up Recommendations SNF;Supervision/Assistance - 24 hour    Equipment Recommendations  None recommended by PT    Recommendations for Other Services       Precautions / Restrictions Precautions Precautions: Fall;Back Precaution Booklet Issued: No Precaution Comments: Reviewed back precautions  Required Braces or Orthoses: Spinal Brace Spinal Brace: Applied in sitting position Restrictions Weight Bearing Restrictions: No      Mobility  Bed Mobility Overal bed mobility: Needs Assistance Bed Mobility: Rolling;Sidelying to Sit Rolling: Min assist Sidelying to sit: Mod assist       General bed mobility comments: with rail; vc for technique to maintain back precautions; max encouragement and physical assist due to anxiety re: moving and anticipated pain  Transfers Overall transfer level: Needs assistance Equipment used: Rolling walker (2 wheeled) Transfers: Sit to/from Stand Sit to Stand: Mod assist Stand pivot transfers: Mod assist       General transfer comment: vc for correct use of RW; stood x1 for 2 minutes with pt anxious, became nauseous, felt "flush" and returned to sitting EOB with RN called; pt remained seated until she relaxed and felt better, then agreed to pivot to  chair  Ambulation/Gait             General Gait Details: unable due to anxiety  Stairs            Wheelchair Mobility    Modified Rankin (Stroke Patients Only)       Balance Overall balance assessment: Needs assistance Sitting-balance support: Bilateral upper extremity supported;Feet supported Sitting balance-Leahy Scale: Poor Sitting balance - Comments: anxious Postural control: Posterior lean Standing balance support: Bilateral upper extremity supported Standing balance-Leahy Scale: Poor                               Pertinent Vitals/Pain Pain Assessment: 0-10 Pain Score: 3  Pain Location: back Pain Descriptors / Indicators: Sore;Aching Pain Intervention(s): Limited activity within patient's tolerance;Monitored during session;Premedicated before session;Repositioned;Relaxation    Home Living Family/patient expects to be discharged to:: Private residence Living Arrangements: Spouse/significant other Available Help at Discharge: Family;Available 24 hours/day Type of Home: House Home Access: Stairs to enter Entrance Stairs-Rails: Left Entrance Stairs-Number of Steps: 6 Home Layout: One level Home Equipment: Walker - 2 wheels;Cane - single point;Shower seat      Prior Function Level of Independence: Needs assistance   Gait / Transfers Assistance Needed: husband assisted with getting out of bed (actually pt sleeping on couch); using cane to walk  ADL's / Homemaking Assistance Needed: husband assisted lifting legs into tub        Hand Dominance   Dominant Hand: Right    Extremity/Trunk Assessment   Upper Extremity Assessment: Defer to OT evaluation  Lower Extremity Assessment: Generalized weakness      Cervical / Trunk Assessment: Other exceptions  Communication   Communication: No difficulties  Cognition Arousal/Alertness: Awake/alert Behavior During Therapy: Anxious Overall Cognitive Status: Within Functional  Limits for tasks assessed                      General Comments      Exercises        Assessment/Plan    PT Assessment Patient needs continued PT services  PT Diagnosis Difficulty walking;Acute pain   PT Problem List Decreased strength;Decreased activity tolerance;Decreased balance;Decreased mobility;Decreased knowledge of use of DME;Decreased safety awareness;Decreased knowledge of precautions;Pain  PT Treatment Interventions DME instruction;Gait training;Functional mobility training;Therapeutic activities;Patient/family education   PT Goals (Current goals can be found in the Care Plan section) Acute Rehab PT Goals Patient Stated Goal: go to SNF [facility]) PT Goal Formulation: With patient Time For Goal Achievement: 08/08/14 Potential to Achieve Goals: Good    Frequency Min 5X/week   Barriers to discharge Decreased caregiver support husband "frail" per pt and cannot provide the current amount of assis    Co-evaluation               End of Session Equipment Utilized During Treatment: Gait belt;Back brace Activity Tolerance: Patient limited by fatigue;Patient limited by pain;Other (comment) (anxiety) Patient left: in chair;with call bell/phone within reach Nurse Communication: Mobility status (no chair alarm pads)         Time: 0930-1009 PT Time Calculation (min) (ACUTE ONLY): 39 min   Charges:   PT Evaluation $Initial PT Evaluation Tier I: 1 Procedure PT Treatments $Therapeutic Activity: 23-37 mins   PT G Codes:        Seriyah Collison 23-Aug-2014, 12:39 PM Pager [phone]

## 2014-08-01 NOTE — Progress Notes (Signed)
Utilization review completed.  

## 2014-08-01 NOTE — Evaluation (Signed)
Occupational Therapy Evaluation Patient Details Name: Angela Bond MRN: [id number] DOB: [birth year] Today's Date: 08/01/2014    History of Present Illness s/p POSTERIOR LUMBAR FUSION 2 LEVEL LUMBAR 4-5,5-S1   Clinical Impression   Patient required minimal assistance for self care tasks PTA and used a cane for functional mobility. Patient currently requires up to total assist for LB ADLs, min guard for UB ADLs, and mod assist for functional mobility/transfers. Patient will benefit from acute OT to increase overall independence in the areas of ADLs, functional mobility, and overall safety in order to safely discharge to venue listed below.     Follow Up Recommendations  SNF;Supervision/Assistance - 24 hour    Equipment Recommendations   (TBD)    Recommendations for Other Services  None at this time     Precautions / Restrictions Precautions Precautions: Fall;Back Precaution Comments: Reviewed back precautions  Required Braces or Orthoses: Spinal Brace Spinal Brace: Applied in sitting position Restrictions Weight Bearing Restrictions: No      Mobility Bed Mobility General bed mobility comments: Patient seated in recliner upon OT entering room, please see PT note for more information  Transfers Overall transfer level: Needs assistance Equipment used: Rolling walker (2 wheeled) Transfers: Sit to/from [employer] Sit to Stand: Mod assist Stand pivot transfers: Mod assist       General transfer comment: No cues needed for hand placement, cues needed for biomechanical technique     Balance Overall balance assessment: Needs assistance Sitting-balance support: No upper extremity supported;Feet supported Sitting balance-Leahy Scale: Fair     Standing balance support: Bilateral upper extremity supported;During functional activity Standing balance-Leahy Scale: Poor    ADL Overall ADL's : Needs assistance/impaired Eating/Feeding: Set up;Sitting    Grooming: Set up;Sitting   Upper Body Bathing: Sitting;Min guard   Lower Body Bathing: Total assistance;Sit to/from stand;Cueing for safety   Upper Body Dressing : Min guard;Sitting   Lower Body Dressing: Total assistance;Sit to/from stand;Cueing for safety   Toilet Transfer: Moderate assistance;RW;BSC   Toileting- Clothing Manipulation and Hygiene: Moderate assistance;Sit to/from stand;Cueing for safety       Functional mobility during ADLs: Moderate assistance;Rolling walker General ADL Comments: Patient unable to cross BLEs for LB ADLs. Educated patient on AE and plan to introduce and demonstrate their usage during a future OT session. Patient able to perform sit<>stand and stand pivot transfer with mod assist using RW with lumbar corset donned. Patient eager to go to rehab prior to home with husband. Patient with week BLEs, bilateral knees buckling while patient in standing.     Pertinent Vitals/Pain Pain Assessment: 0-10 Pain Score: 3  Pain Location: back Pain Descriptors / Indicators: Sore;Aching Pain Intervention(s): Monitored during session     Hand Dominance Right   Extremity/Trunk Assessment Upper Extremity Assessment Upper Extremity Assessment: Generalized weakness   Lower Extremity Assessment Lower Extremity Assessment: Defer to PT evaluation   Cervical / Trunk Assessment Cervical / Trunk Assessment: Normal   Communication Communication Communication: No difficulties   Cognition Arousal/Alertness: Awake/alert Behavior During Therapy: WFL for tasks assessed/performed Overall Cognitive Status: Within Functional Limits for tasks assessed              Home Living Family/patient expects to be discharged to:: Private residence Living Arrangements: Spouse/significant other Available Help at Discharge: Family;Available 24 hours/day Type of Home: House Home Access: Stairs to enter [employer] of Steps: 6 Entrance Stairs-Rails: Left Home  Layout: One level     Bathroom Shower/Tub: Tub/shower unit;Curtain   Bathroom  Toilet: Standard     Home Equipment: Walker - 2 wheels;Cane - single point;Shower seat          Prior Functioning/Environment Level of Independence: Needs assistance  Gait / Transfers Assistance Needed: husband assisted with getting out of bed (actually pt sleeping on couch); using cane to walk ADL's / Homemaking Assistance Needed: husband assisted lifting legs into tub        OT Diagnosis: Generalized weakness;Acute pain   OT Problem List: Decreased strength;Decreased activity tolerance;Impaired balance (sitting and/or standing);Decreased safety awareness;Decreased knowledge of use of DME or AE;Decreased knowledge of precautions;Pain   OT Treatment/Interventions: Self-care/ADL training;Energy conservation;DME and/or AE instruction;Therapeutic activities;Patient/family education;Balance training    OT Goals(Current goals can be found in the care plan section) Acute Rehab OT Goals Patient Stated Goal: go to rehab OT Goal Formulation: With patient/family Time For Goal Achievement: 08/08/14 Potential to Achieve Goals: Good ADL Goals Pt Will Perform Grooming: with supervision;standing Pt Will Perform Lower Body Bathing: with mod assist;sit to/from stand;with adaptive equipment Pt Will Perform Lower Body Dressing: with mod assist;sit to/from stand;with adaptive equipment Pt Will Transfer to Toilet: with min assist;ambulating;bedside commode Pt Will Perform Toileting - Clothing Manipulation and hygiene: with mod assist;sit to/from stand;with adaptive equipment Pt Will Perform Tub/Shower Transfer: Tub transfer;with mod assist;ambulating;rolling walker;3 in 1 Additional ADL Goal #1: Patient will independently verbalize and adhere to 3/3 back precautions 100% of the time  OT Frequency: Min 2X/week   Barriers to D/C: None known at this time          End of Session Equipment Utilized During Treatment:  Gait belt;Rolling walker;Back brace  Activity Tolerance: Patient tolerated treatment well Patient left: in chair;with call bell/phone within reach;with family/visitor present   Time: 7026-3785 OT Time Calculation (min): 24 min Charges:  OT General Charges $OT Visit: 1 Procedure OT Evaluation $Initial OT Evaluation Tier I: 1 Procedure OT Treatments $Self Care/Home Management : 8-22 mins  Starr Urias , MS, OTR/L, CLT Pager: [phone]  08/01/2014, 11:36 AM

## 2014-08-02 MED ORDER — POLYVINYL ALCOHOL 1.4 % OP SOLN
1.0000 [drp] | OPHTHALMIC | Status: DC | PRN
  Administered 2014-08-02 – 2014-08-03 (×4): 1 [drp] via OPHTHALMIC
  Filled 2014-08-02: qty 15

## 2014-08-02 NOTE — Progress Notes (Signed)
CSW Intern Alena Bills Mclean's note has been reviewed and I agree with her assessment.  Creta Levin LCSW

## 2014-08-02 NOTE — Progress Notes (Signed)
Patient ID: Angela Bond, female   DOB: 1945-07-11, 69 y.o.   MRN: [id number] Subjective:  The patient is alert and pleasant. She complains of dry eyes and wants her eyedrops. She is planning to go to a skilled nursing facility.  Objective: Vital signs in last 24 hours: Temp:  [98.2 F (36.8 C)-98.8 F (37.1 C)] 98.2 F (36.8 C) (03/19 0456) Pulse Rate:  [79-95] 79 (03/19 0456) Resp:  [16-20] 16 (03/19 0456) BP: (102-131)/(53-66) 118/61 mmHg (03/19 0456) SpO2:  [96 %-97 %] 96 % (03/19 0456)  Intake/Output from previous day: 03/18 0701 - 03/19 0700 In: 580 [P.O.:580] Out: 350 [Urine:350] Intake/Output this shift:    Physical exam the patient is alert and pleasant. She is moving her lower extremities well.  Lab Results: No results for input(s): WBC, HGB, HCT, PLT in the last 72 hours. BMET No results for input(s): NA, K, CL, CO2, GLUCOSE, BUN, CREATININE, CALCIUM in the last 72 hours.  Studies/Results: Dg Lumbar Spine 2-3 Views  07/31/2014   CLINICAL DATA:  L4-5 and L5-S1 PLIF  EXAM: DG C-ARM 61-120 MIN; LUMBAR SPINE - 2-3 VIEW  COMPARISON:  None.  FLUOROSCOPY TIME:  Radiation Exposure Index (as provided by the fluoroscopic device): Not available  If the device does not provide the exposure index:  Fluoroscopy Time:  3 minutes 37 seconds  Number of Acquired Images:  2  FINDINGS: Pedicular screws are seen at L4, L5 and S1 with interbody fusion at both levels. No hardware abnormality is seen.  IMPRESSION: Status post fusion at L4-5 and L5-S1.   Electronically Signed   By: Inez Catalina M.D.   On: 07/31/2014 15:48   Dg C-arm 61-120 Min  07/31/2014   CLINICAL DATA:  L4-5 and L5-S1 PLIF  EXAM: DG C-ARM 61-120 MIN; LUMBAR SPINE - 2-3 VIEW  COMPARISON:  None.  FLUOROSCOPY TIME:  Radiation Exposure Index (as provided by the fluoroscopic device): Not available  If the device does not provide the exposure index:  Fluoroscopy Time:  3 minutes 37 seconds  Number of Acquired Images:  2  FINDINGS:  Pedicular screws are seen at L4, L5 and S1 with interbody fusion at both levels. No hardware abnormality is seen.  IMPRESSION: Status post fusion at L4-5 and L5-S1.   Electronically Signed   By: Inez Catalina M.D.   On: 07/31/2014 15:48    Assessment/Plan: Postop day #2: I will start natural tears. She will likely go to a skilled nursing facility on Monday.  LOS: 2 days     Keyairra Kolinski D 08/02/2014, 8:45 AM

## 2014-08-02 NOTE — Clinical Social Work Psychosocial (Cosign Needed)
Clinical Social Work Department BRIEF PSYCHOSOCIAL ASSESSMENT 08/02/2014  Patient:  Angela Bond, Angela Bond     Account Number:  [id number]     Admit date:  07/31/2014  Clinical Social Worker:  Oretha Ellis, [place]  Date/Time:  08/02/2014 03:29 PM  Referred by:  Physician  Date Referred:  08/02/2014 Referred for  SNF Placement   Other Referral:   Interview type:  Patient Other interview type:    PSYCHOSOCIAL DATA Living Status:  HUSBAND Admitted from facility:   Level of care:   Primary support name:  Nelsy Madonna Primary support relationship to patient:  SPOUSE Degree of support available:   Patient states husband is supportive. He was not currently present at bedside.    CURRENT CONCERNS Current Concerns  Post-Acute Placement   Other Concerns:    SOCIAL WORK ASSESSMENT / PLAN CSW intern spoke with patient about possible SNF placement. Patient had already spoken with admission director, Wray Kearns, at [facility] and wants to go to [facility] for rehab. Patient agrees for CSW Intern to complete FL-2 and initiate SNF search for bed offers within [facility], which would include [place]. CSW to follow up with patient to provide available bed offers. CSW remains available for support and to facilitate patient discharge need once medically ready.   Assessment/plan status:  Psychosocial Support/Ongoing Assessment of Needs Other assessment/ plan:   Information/referral to community resources:   CSW to provide patient with facility list once bed offers are available.    PATIENT'S/FAMILY'S RESPONSE TO PLAN OF CARE: Patient alert and oriented x3 in the bed. No family or friends currently present at bedside. Patient engaged in [place] Intern assessment and agreeable with discharge plan. Patient understanding of Social Work role and appreciative of support.

## 2014-08-02 NOTE — Progress Notes (Signed)
Physical Therapy Treatment Patient Details Name: Angela Bond MRN: [id number] DOB: 05/19/1945 Today's Date: 08/02/2014    History of Present Illness s/p POSTERIOR LUMBAR FUSION 2 LEVEL LUMBAR 4-5,5-S1    PT Comments    Patient continues to be limited by anxiety. Patient stated that she felt sick and nauseated but never positive for emesis. Will work on increasing ambulation. Continue to recommend SNF for ongoing Physical Therapy.     Follow Up Recommendations  SNF;Supervision/Assistance - 24 hour     Equipment Recommendations  None recommended by PT    Recommendations for Other Services       Precautions / Restrictions Precautions Precautions: Fall;Back Precaution Comments: Reviewed back precautions  Required Braces or Orthoses: Spinal Brace Spinal Brace: Applied in sitting position    Mobility  Bed Mobility Overal bed mobility: Needs Assistance Bed Mobility: Rolling;Sidelying to Sit   Sidelying to sit: Mod assist       General bed mobility comments: A for LEs back into bed. Cues for log roll technique  Transfers Overall transfer level: Needs assistance Equipment used: Rolling walker (2 wheeled)   Sit to Stand: Mod assist         General transfer comment: Mod A to power up into standing and to ensure balance.   Ambulation/Gait Ambulation/Gait assistance: Min assist;+2 safety/equipment (chair follow) Ambulation Distance (Feet): 16 Feet Assistive device: Rolling walker (2 wheeled) Gait Pattern/deviations: Step-to pattern;Shuffle     General Gait Details: patient able to walk from recliner aruond to other side of bed but began to feel nauseated and requested to sit. A for safe management of RW and for balance.    Stairs            Wheelchair Mobility    Modified Rankin (Stroke Patients Only)       Balance                                    Cognition Arousal/Alertness: Awake/alert Behavior During Therapy: Anxious Overall  Cognitive Status: Within Functional Limits for tasks assessed                      Exercises      General Comments        Pertinent Vitals/Pain Pain Score: 6  Pain Location: back Pain Descriptors / Indicators: Aching;Sore Pain Intervention(s): Limited activity within patient's tolerance    Home Living                      Prior Function            PT Goals (current goals can now be found in the care plan section) Progress towards PT goals: Progressing toward goals    Frequency  Min 5X/week    PT Plan Current plan remains appropriate    Co-evaluation             End of Session Equipment Utilized During Treatment: Gait belt;Back brace Activity Tolerance: Patient limited by fatigue (nausea) Patient left: with call bell/phone within reach;in bed;with family/visitor present     Time: 3614-4315 PT Time Calculation (min) (ACUTE ONLY): 21 min  Charges:  $Gait Training: 8-22 mins                    G Codes:      Jacqualyn Posey 08/02/2014, 12:38 PM 08/02/2014 Jacqualyn Posey PTA [phone] pager  [phone] office     

## 2014-08-03 NOTE — Progress Notes (Signed)
Patient ID: Angela Bond, female   DOB: May 17, 1945, 69 y.o.   MRN: [id number] BP 152/86 mmHg  Pulse 87  Temp(Src) 99 F (37.2 C) (Oral)  Resp 20  Ht 5' (1.524 m)  Wt 78.926 kg (174 lb)  BMI 33.98 kg/m2  SpO2 97% Alert, oriented x 4, speech is clear and fluent.  Moving lower extremities well, strength is normal Wound is clean, dry, no signs of infection Awaiting placement

## 2014-08-04 NOTE — Progress Notes (Signed)
Patient ID: Angela Bond, female   DOB: Feb 02, 1946, 69 y.o.   MRN: [id number] Afeb, vss No new neuro issues Steadily increasing activity. She is ready for SNF placement soon and will plan on release tomorrow if everything is in order from CSW standpoint.

## 2014-08-04 NOTE — Clinical Social Work Note (Signed)
Patient has a bed [facility] [place]. CSW will continue to follow pt and pt's family for continued support and to facilitate pt's discharge needs once medically stable.   FL-2 on chart for MD signature.   Glendon Axe, MSW, LCSWA [phone] 08/04/2014 11:15 AM

## 2014-08-04 NOTE — Care Management Note (Signed)
    Page 1 of 1   08/04/2014     1:32:26 PM CARE MANAGEMENT NOTE 08/04/2014  Patient:  Angela Bond, Angela Bond   Account Number:  [id number]  Date Initiated:  08/04/2014  Documentation initiated by:  Lorne Skeens  Subjective/Objective Assessment:   Patient was admitted for PLIF. Lives at home with spouse.     Action/Plan:   Will follow for discharge needs pending PT/OT evals and physician orders.   Anticipated DC Date:  08/05/2014   Anticipated DC Plan:  SKILLED NURSING FACILITY  In-house referral  Clinical Social Worker         Choice offered to / List presented to:             Status of service:   Medicare Important Message given?  YES (If response is "NO", the following Medicare IM given date fields will be blank) Date Medicare IM given:  08/04/2014 Medicare IM given by:  Lorne Skeens Date Additional Medicare IM given:   Additional Medicare IM given by:    Discharge Disposition:    Per UR Regulation:  Reviewed for med. necessity/level of care/duration of stay  If discussed at [place] of Stay Meetings, dates discussed:    Comments:  08/04/14 [place], MSN, CM- Medicare IM letter provided.

## 2014-08-04 NOTE — Progress Notes (Signed)
Physical Therapy Treatment Patient Details Name: Angela Bond MRN: [id number] DOB: 1946-04-15 Today's Date: 08/04/2014    History of Present Illness s/p POSTERIOR LUMBAR FUSION 2 LEVEL LUMBAR 4-5,5-S1    PT Comments    Patient progressing well with overall mobility. Appears less anxious and was able to ambulate out in hallway. Patient is planning to DC to SNF once medically ready. Will continue with current POC  Follow Up Recommendations  SNF;Supervision/Assistance - 24 hour     Equipment Recommendations  None recommended by PT    Recommendations for Other Services       Precautions / Restrictions Precautions Precautions: Fall;Back Precaution Comments: Reviewed back precautions  Required Braces or Orthoses: Spinal Brace Spinal Brace: Applied in sitting position    Mobility  Bed Mobility Overal bed mobility: Needs Assistance Bed Mobility: Rolling;Sidelying to Sit Rolling: Supervision            Transfers Overall transfer level: Needs assistance Equipment used: Rolling walker (2 wheeled)   Sit to Stand: Min guard         General transfer comment: CUes for correct hand placement and technique  Ambulation/Gait Ambulation/Gait assistance: Min guard Ambulation Distance (Feet): 140 Feet Assistive device: Rolling walker (2 wheeled) Gait Pattern/deviations: Step-through pattern;Decreased stride length     General Gait Details: Cues for safe turning otherwise safe use of RW and good technique   Stairs            Wheelchair Mobility    Modified Rankin (Stroke Patients Only)       Balance                                    Cognition Arousal/Alertness: Awake/alert Behavior During Therapy: WFL for tasks assessed/performed Overall Cognitive Status: Within Functional Limits for tasks assessed                      Exercises      General Comments        Pertinent Vitals/Pain Pain Assessment: No/denies pain    Home  Living                      Prior Function            PT Goals (current goals can now be found in the care plan section) Progress towards PT goals: Progressing toward goals    Frequency  Min 5X/week    PT Plan Current plan remains appropriate    Co-evaluation             End of Session Equipment Utilized During Treatment: Back brace Activity Tolerance: Patient tolerated treatment well Patient left: in chair;with call bell/phone within reach;with chair alarm set     Time: 3212-2482 PT Time Calculation (min) (ACUTE ONLY): 17 min  Charges:  $Gait Training: 8-22 mins                    G Codes:      Jacqualyn Posey 08/04/2014, 11:54 AM  08/04/2014 Jacqualyn Posey PTA [phone] pager [phone] office

## 2014-08-05 MED ORDER — OXYCODONE-ACETAMINOPHEN 5-325 MG PO TABS: 1.0000 | ORAL_TABLET | ORAL | Status: DC | PRN

## 2014-08-05 MED ORDER — BISACODYL 10 MG RE SUPP
10.0000 mg | Freq: Once | RECTAL | Status: AC
  Administered 2014-08-05: 10 mg via RECTAL
  Filled 2014-08-05: qty 1

## 2014-08-05 NOTE — Progress Notes (Signed)
Discharge orders received, pt for discharge today to [facility].  IV  D/C.  D/C instructions and Rx in packet for receiving facility and report called to SNF.  Family at the bedside to assist with discharge. [facility] brought pt downstairs via stretcher.

## 2014-08-05 NOTE — Clinical Social Work Note (Signed)
Clinical Social Worker facilitated patient discharge including contacting patient family and facility to confirm patient discharge plans.  Clinical information faxed to facility and family agreeable with plan.  CSW arranged ambulance transport via [facility] to [place].  RN to call report prior to discharge.  DC packet prepared and on chart for transport.  Clinical Social Worker will sign off for now as social work intervention is no longer needed. Please consult [place] again if new need arises.  Glendon Axe, MSW, LCSWA [phone] 08/05/2014 10:33 AM

## 2014-08-05 NOTE — Progress Notes (Signed)
Pt ambulated in the hallway appx 400 ft with RN, using brace and walker.  Pt tolerated ambulation well, steady gait.  Pt has not had BM since 3/16, suppository ordered, will admin after breakfast.

## 2014-08-05 NOTE — Progress Notes (Signed)
Physical Therapy Treatment Patient Details Name: Angela Bond MRN: [id number] DOB: [birth year] Today's Date: 08/05/2014    History of Present Illness s/p POSTERIOR LUMBAR FUSION 2 LEVEL LUMBAR 4-5,5-S1    PT Comments    Patient limited by pain this session. Stated that she had been up in the chair for a while and was sore. Patient agreeable (with some encouragement) to walk some with ease the pain. Patient also stated that she was needing to have a BM and was due for meds. RN made aware. Patient returned to bed and positioned for comfort. Plan is to DC to SNF later today.   Follow Up Recommendations  SNF;Supervision/Assistance - 24 hour     Equipment Recommendations  None recommended by PT    Recommendations for Other Services       Precautions / Restrictions Precautions Precautions: Back Precaution Comments: Patient able to recall all precautions this session Required Braces or Orthoses: Spinal Brace Spinal Brace: Applied in sitting position    Mobility  Bed Mobility Overal bed mobility: Needs Assistance Bed Mobility: Sit to Sidelying         Sit to sidelying: Min assist General bed mobility comments: Min A to LEs back into bed and cues for positioning once in the bed  Transfers Overall transfer level: Needs assistance Equipment used: Rolling walker (2 wheeled)   Sit to Stand: Min guard         General transfer comment: CUes for correct hand placement. Patient tends to want to pull up from RW  Ambulation/Gait Ambulation/Gait assistance: Min guard Ambulation Distance (Feet): 70 Feet Assistive device: Rolling walker (2 wheeled) Gait Pattern/deviations: Step-through pattern;Decreased stride length Gait velocity: quickly due to pain and readiness to be back in the bed   General Gait Details: Patient a little more quick with ambulation today due to pain and wanting to get back into the bed. Cues for safety with use of walker this session as she was  rushing   Financial trader Rank[employer] (Stroke Patients Only)       Balance                                    Cognition Arousal/Alertness: Awake/alert Behavior During Therapy: WFL for tasks assessed/performed Overall Cognitive Status: Within Functional Limits for tasks assessed                      Exercises      General Comments        Pertinent Vitals/Pain Pain Score: 8  Pain Location: back/stomach pain Pain Descriptors / Indicators: Sore;Tightness Pain Intervention(s): Monitored during session;Repositioned    Home Living                      Prior Function            PT Goals (current goals can now be found in the care plan section) Progress towards PT goals: Progressing toward goals    Frequency  Min 5X/week    PT Plan Current plan remains appropriate    Co-evaluation             End of Session Equipment Utilized During Treatment: Back brace Activity Tolerance: Patient limited by pain Patient left: in bed;with call bell/phone within reach     Time: 1287-8676 PT  Time Calculation (min) (ACUTE ONLY): 12 min  Charges:  $Gait Training: 8-22 mins                    G Codes:      Jacqualyn Posey 08/05/2014, 8:50 AM 08/05/2014 Jacqualyn Posey PTA [phone] pager [phone] office

## 2014-08-05 NOTE — Discharge Summary (Signed)
Physician Discharge Summary  Patient ID: Angela Bond MRN: [id number] DOB/AGE: 1945/09/09 69 y.o.  Admit date: 07/31/2014 Discharge date: 08/05/2014  Admission Diagnoses:  Discharge Diagnoses:  Active Problems:   Lumbar spinal stenosis   Discharged Condition: good  Hospital Course: Surgery 5 days ago for 2 level back fusion.Did well Slow to increase activity. PT/OT felt she would benefit from SNF as did the patient. Arranged for her and sent to SNF post op day 5. Wound healing well. Pain better than pre op. No new neuro issues.Specific instructions given.  Consults: None  Significant Diagnostic Studies: none  Treatments: surgery: 2 level lumbar fusion  Discharge Exam: Blood pressure 128/67, pulse 86, temperature 98.9 F (37.2 C), temperature source Oral, resp. rate 17, height 5' (1.524 m), weight 78.926 kg (174 lb), SpO2 97 %. Incision/Wound:clean and dry; no new neuro issues  Disposition: 01-Home or Self Care     Medication List    ASK your doctor about these medications        ALPRAZolam 0.25 MG tablet  Commonly known as:  XANAX  Take 0.25 mg by mouth at bedtime as needed for sleep or anxiety.     calcium carbonate 600 MG Tabs tablet  Commonly known as:  OS-CAL  Take 600 mg by mouth 2 (two) times daily with a meal.     carboxymethylcellulose 0.5 % Soln  Commonly known as:  REFRESH PLUS  1 drop 4 (four) times daily.     dexlansoprazole 60 MG capsule  Commonly known as:  DEXILANT  Take 60 mg by mouth daily.     fexofenadine 180 MG tablet  Commonly known as:  ALLEGRA  Take 180 mg by mouth daily.     FLUoxetine 20 MG capsule  Commonly known as:  PROZAC  Take 20 mg by mouth daily.     HYDROcodone-acetaminophen 5-325 MG per tablet  Commonly known as:  NORCO  Take 1 tablet by mouth every 4 (four) hours as needed for moderate pain.     HYDROcodone-acetaminophen 5-325 MG per tablet  Commonly known as:  NORCO/VICODIN  Take 1 tablet by mouth every 6 (six)  hours as needed for moderate pain or severe pain.     lovastatin 40 MG tablet  Commonly known as:  MEVACOR  Take 40 mg by mouth daily before breakfast.     meclizine 25 MG tablet  Commonly known as:  ANTIVERT  Take 12.5-25 mg by mouth 3 (three) times daily as needed for dizziness.     meloxicam 15 MG tablet  Commonly known as:  MOBIC  TAKE 1 TABLET (15 MG TOTAL) BY MOUTH DAILY.     polyethylene glycol packet  Commonly known as:  MIRALAX / GLYCOLAX  Take 17 g by mouth as needed.     sodium chloride 0.65 % Soln nasal spray  Commonly known as:  OCEAN  Place 2 sprays into both nostrils as needed for congestion.     valACYclovir 1000 MG tablet  Commonly known as:  VALTREX  Take 1 tablet (1,000 mg total) by mouth 3 (three) times daily.     Vitamin D 2000 UNITS tablet  Take 2,000 Units by mouth 2 (two) times daily.         At home rest most of the time. Get up 9 or 10 times each day and take a 15 or 20 minute walk. No riding in the car and to your first postoperative appointment. If you have neck surgery you may shower from  the chest down starting on the third postoperative day. If you had back surgery he may start showering on the third postoperative day with saran wrap wrapped around your incisional area 3 times. After the shower remove the saran wrap. Take pain medicine as needed and other medications as instructed. Call my office for an appointment.  SignedFaythe Ghee, MD 08/05/2014, 10:28 AM

## 2014-08-15 HISTORY — PX: BREAST LUMPECTOMY: SHX2

## 2014-08-21 ENCOUNTER — Non-Acute Institutional Stay (SKILLED_NURSING_FACILITY): Payer: Medicare Other | Admitting: Internal Medicine

## 2014-08-21 DIAGNOSIS — K219 Gastro-esophageal reflux disease without esophagitis: Secondary | ICD-10-CM | POA: Diagnosis not present

## 2014-08-21 DIAGNOSIS — F32A Depression, unspecified: Secondary | ICD-10-CM

## 2014-08-21 DIAGNOSIS — F329 Major depressive disorder, single episode, unspecified: Secondary | ICD-10-CM

## 2014-08-21 DIAGNOSIS — M4806 Spinal stenosis, lumbar region: Secondary | ICD-10-CM

## 2014-08-21 DIAGNOSIS — E7800 Pure hypercholesterolemia, unspecified: Secondary | ICD-10-CM

## 2014-08-21 DIAGNOSIS — M48061 Spinal stenosis, lumbar region without neurogenic claudication: Secondary | ICD-10-CM

## 2014-08-21 NOTE — Progress Notes (Signed)
MRN: [id number] Name: Angela Bond  Sex: female Age: 69 y.o. DOB: 10-27-1945  [place] #: Helene Kelp Facility/Room: Level Of Care: SNF Provider: Inocencio Homes D Emergency Contacts: Extended Emergency Contact Information Primary Emergency Contact: Lesly Dukes Address: [address]          [place], [place] [address] [place] Phone: [phone] Mobile Phone: [phone] Relation: Spouse  Code Status:   Allergies: Lactose intolerance (gi); Aleve; Aspirin; Ciprofloxacin; Doxycycline; Evista; Fosamax; Pravachol; Sulfa antibiotics; Welchol; and Zocor  Chief Complaint  Patient presents with  . New Admit To SNF    HPI: Patient is 69 y.o. female who is admitted to SNF  For OT/PTafter a 2 level lumbar fusion.   Past Medical History  Diagnosis Date  . Arthritis   . Hypertension   . GERD (gastroesophageal reflux disease)   . Hypercholesteremia   . Heart murmur   . Anxiety and depression   . H/O hiatal hernia   . Headache(784.0)     sinus  . Dysrhythmia   . Shortness of breath     "stomach is in lungs" when had hernia 2014 none now(07/28/14)  . Pneumonia     hx  . Depression   . Anxiety   . Anemia     hx  . Shingles 5/15    hx    Past Surgical History  Procedure Laterality Date  . Total knee arthroplasty Bilateral   . Esophageal manometry N/A 07/02/2012    Procedure: ESOPHAGEAL MANOMETRY (EM);  Surgeon: Lear Ng, MD;  Location: [facility];  Service: Endoscopy;  Laterality: N/A;  schooler to read  . Joint replacement Bilateral 2011,2012    knees  . Breast surgery      r breast biopsy  . Tonsillectomy  as child  . Hiatal hernia repair N/A 08/10/2012    Procedure: LAPAROSCOPIC REPAIR OF HIATAL HERNIA WITH MESH AND EGD WITH PEG TUBE PLACEMENT;  Surgeon: Ralene Ok, MD;  Location: [facility] ORS;  Service: General;  Laterality: N/A;  . Eye surgery  2013    tube placed Left ,tear duct      Medication List       This list is accurate  as of: 08/21/14 11:59 PM.  Always use your most recent med list.               ALPRAZolam 0.25 MG tablet  Commonly known as:  XANAX  Take 0.25 mg by mouth at bedtime as needed for sleep or anxiety.     calcium carbonate 600 MG Tabs tablet  Commonly known as:  OS-CAL  Take 600 mg by mouth 2 (two) times daily with a meal.     carboxymethylcellulose 0.5 % Soln  Commonly known as:  REFRESH PLUS  1 drop 4 (four) times daily.     dexlansoprazole 60 MG capsule  Commonly known as:  DEXILANT  Take 60 mg by mouth daily.     fexofenadine 180 MG tablet  Commonly known as:  ALLEGRA  Take 180 mg by mouth daily.     FLUoxetine 20 MG capsule  Commonly known as:  PROZAC  Take 20 mg by mouth daily.     lovastatin 40 MG tablet  Commonly known as:  MEVACOR  Take 40 mg by mouth daily before breakfast.     meclizine 25 MG tablet  Commonly known as:  ANTIVERT  Take 12.5-25 mg by mouth 3 (three) times daily as needed for dizziness.     oxyCODONE-acetaminophen  5-325 MG per tablet  Commonly known as:  PERCOCET/ROXICET  Take 1-2 tablets by mouth every 4 (four) hours as needed for moderate pain.     polyethylene glycol packet  Commonly known as:  MIRALAX / GLYCOLAX  Take 17 g by mouth as needed.     sodium chloride 0.65 % Soln nasal spray  Commonly known as:  OCEAN  Place 2 sprays into both nostrils as needed for congestion.     valACYclovir 1000 MG tablet  Commonly known as:  VALTREX  Take 1 tablet (1,000 mg total) by mouth 3 (three) times daily.     Vitamin D 2000 UNITS tablet  Take 2,000 Units by mouth 2 (two) times daily.        No orders of the defined types were placed in this encounter.     There is no immunization history on file for this patient.  History  Substance Use Topics  . Smoking status: Never Smoker   . Smokeless tobacco: Never Used  . Alcohol Use: No    Family history is noncontributory    Review of Systems  DATA OBTAINED: from patient,  nurse GENERAL:  no fevers, fatigue, appetite changes SKIN: No itching, rash or wounds EYES: No eye pain, redness, discharge EARS: No earache, tinnitus, change in hearing NOSE: No congestion, drainage or bleeding  MOUTH/THROAT: No mouth or tooth pain, No sore throat RESPIRATORY: No cough, wheezing, SOB CARDIAC: No chest pain, palpitations, lower extremity edema  GI: No abdominal pain, No N/V/D or constipation, No heartburn or reflux  GU: No dysuria, frequency or urgency, or incontinence  MUSCULOSKELETAL: No unrelieved bone/joint pain NEUROLOGIC: No headache, dizziness or focal weakness PSYCHIATRIC: No overt anxiety or sadness, No behavior issue.   Filed Vitals:   08/23/14 1917  BP: 128/67  Pulse: 86  Temp: 98.9 F (37.2 C)  Resp: 17    Physical Exam  GENERAL APPEARANCE: Alert, conversant,  No acute distress.  SKIN: No diaphoresis rash HEAD: Normocephalic, atraumatic  EYES: Conjunctiva/lids clear. Pupils round, reactive. EOMs intact.  EARS: External exam WNL, canals clear. Hearing grossly normal.  NOSE: No deformity or discharge.  MOUTH/THROAT: Lips w/o lesions  RESPIRATORY: Breathing is even, unlabored. Lung sounds are clear   CARDIOVASCULAR: Heart RRR no murmurs, rubs or gallops. No peripheral edema.   GASTROINTESTINAL: Abdomen is soft, non-tender, not distended w/ normal bowel sounds. GENITOURINARY: Bladder non tender, not distended  MUSCULOSKELETAL: No abnormal joints or musculature NEUROLOGIC:  Cranial nerves 2-12 grossly intact. Moves all extremities  PSYCHIATRIC: Mood and affect appropriate to situation, no behavioral issues  Patient Active Problem List   Diagnosis Date Noted  . Lumbar spinal stenosis 07/31/2014  . AKI (acute kidney injury) 09/28/2013  . Hyperkalemia 09/26/2013  . Hyponatremia 09/25/2013  . Shingles 09/23/2013  . Chest pressure 09/20/2013  . Hypotension due to drugs 09/20/2013  . GERD (gastroesophageal reflux disease)   . Depression   .  Hypercholesteremia   . Arthritis   . Abdominal pain, unspecified site 10/12/2012  . Atrial fibrillation 08/12/2012  . Hypertension   . Sliding hiatal hernia s/p lap PEH repair 07/31/2012  . Other and unspecified angina pectoris 01/23/2012    CBC    Component Value Date/Time   WBC 7.7 07/28/2014 0942   RBC 4.52 07/28/2014 0942   HGB 12.8 07/28/2014 0942   HCT 38.6 07/28/2014 0942   PLT 216 07/28/2014 0942   MCV 85.4 07/28/2014 0942   LYMPHSABS 2.7 01/14/2012 0322   MONOABS 0.7 01/14/2012  0322   EOSABS 0.2 01/14/2012 0322   BASOSABS 0.0 01/14/2012 0322    CMP     Component Value Date/Time   NA 139 07/28/2014 0942   K 4.4 07/28/2014 0942   CL 99 07/28/2014 0942   CO2 28 07/28/2014 0942   GLUCOSE 107* 07/28/2014 0942   BUN 6 07/28/2014 0942   CREATININE 0.86 07/28/2014 0942   CALCIUM 9.5 07/28/2014 0942   PROT 7.0 09/20/2013 1426   ALBUMIN 3.1* 09/20/2013 1426   AST 16 09/20/2013 1426   ALT 14 09/20/2013 1426   ALKPHOS 81 09/20/2013 1426   BILITOT 0.5 09/20/2013 1426   GFRNONAA 67* 07/28/2014 0942   GFRAA 78* 07/28/2014 0942    Assessment and Plan  Lumbar spinal stenosis S/p a two-level posterior lumbar interbody fusion with pedicle screw fixation; admitted for OT/PT   GERD (gastroesophageal reflux disease) Controlled on dexilant 60 mg daily   Depression Continue prozac and xanax q HS.   Hypercholesteremia LDL -90 and HDL -55 on mevacor 40 mg daily.    Pt seen 08/07/2014 Hennie Duos, MD

## 2014-08-22 ENCOUNTER — Non-Acute Institutional Stay (SKILLED_NURSING_FACILITY): Payer: Medicare Other | Admitting: Nurse Practitioner

## 2014-08-22 DIAGNOSIS — M4806 Spinal stenosis, lumbar region: Secondary | ICD-10-CM

## 2014-08-22 DIAGNOSIS — E7800 Pure hypercholesterolemia, unspecified: Secondary | ICD-10-CM

## 2014-08-22 DIAGNOSIS — M48061 Spinal stenosis, lumbar region without neurogenic claudication: Secondary | ICD-10-CM

## 2014-08-22 DIAGNOSIS — N63 Unspecified lump in breast: Secondary | ICD-10-CM | POA: Diagnosis not present

## 2014-08-22 DIAGNOSIS — K219 Gastro-esophageal reflux disease without esophagitis: Secondary | ICD-10-CM | POA: Diagnosis not present

## 2014-08-22 DIAGNOSIS — N6310 Unspecified lump in the right breast, unspecified quadrant: Secondary | ICD-10-CM

## 2014-08-22 DIAGNOSIS — F411 Generalized anxiety disorder: Secondary | ICD-10-CM | POA: Diagnosis not present

## 2014-08-22 DIAGNOSIS — M1990 Unspecified osteoarthritis, unspecified site: Secondary | ICD-10-CM

## 2014-08-22 DIAGNOSIS — K5900 Constipation, unspecified: Secondary | ICD-10-CM | POA: Diagnosis not present

## 2014-08-22 NOTE — Progress Notes (Signed)
Patient ID: Angela Bond, female   DOB: 27-Jul-1945, 69 y.o.   MRN: [id number]    Nursing Home Location:  [place] of Service: [facility] (31)  PCP: Simona Huh, MD  Allergies  Allergen Reactions  . Lactose Intolerance (Gi) Other (See Comments)  . Aleve [Naproxen] Nausea And Vomiting  . Aspirin Other (See Comments)    Burns stomach  . Ciprofloxacin Nausea And Vomiting  . Doxycycline Nausea And Vomiting  . Evista [Raloxifene] Other (See Comments)    Leg aches  . Fosamax [Alendronate Sodium] Other (See Comments)    Leg aches  . Pravachol [Pravastatin] Other (See Comments)    GI upset   . Sulfa Antibiotics Nausea Only  . Welchol [Colesevelam Hcl] Other (See Comments)    Stomach cramps  . Zocor [Simvastatin] Rash    Chief Complaint  Patient presents with  . Discharge Note    HPI:  Patient is a 69 y.o. female seen today at [facility] for discharge home. Pt is at [facility] for ongoing therapy after lumbar fusion. Patient currently doing well with therapy, now stable to discharge home with home health.  Review of Systems:  Review of Systems  Constitutional: Negative for activity change, appetite change, fatigue and unexpected weight change.  HENT: Negative for congestion and hearing loss.   Eyes: Negative.   Respiratory: Negative for cough and shortness of breath.   Cardiovascular: Negative for chest pain, palpitations and leg swelling.  Gastrointestinal: Negative for abdominal pain, diarrhea and constipation.  Genitourinary: Negative for dysuria and difficulty urinating.  Musculoskeletal: Positive for arthralgias.       Back pain with improvement, ongoing pain in bilateral knees  Skin: Negative for color change and wound.  Neurological: Negative for dizziness and weakness.  Psychiatric/Behavioral: Negative for behavioral problems, confusion and agitation.    Past Medical History  Diagnosis Date  . Arthritis   . Hypertension     . GERD (gastroesophageal reflux disease)   . Hypercholesteremia   . Heart murmur   . Anxiety and depression   . H/O hiatal hernia   . Headache(784.0)     sinus  . Dysrhythmia   . Shortness of breath     "stomach is in lungs" when had hernia 2014 none now(07/28/14)  . Pneumonia     hx  . Depression   . Anxiety   . Anemia     hx  . Shingles 5/15    hx   Past Surgical History  Procedure Laterality Date  . Total knee arthroplasty Bilateral   . Esophageal manometry N/A 07/02/2012    Procedure: ESOPHAGEAL MANOMETRY (EM);  Surgeon: Lear Ng, MD;  Location: [facility];  Service: Endoscopy;  Laterality: N/A;  schooler to read  . Joint replacement Bilateral 2011,2012    knees  . Breast surgery      r breast biopsy  . Tonsillectomy  as child  . Hiatal hernia repair N/A 08/10/2012    Procedure: LAPAROSCOPIC REPAIR OF HIATAL HERNIA WITH MESH AND EGD WITH PEG TUBE PLACEMENT;  Surgeon: Ralene Ok, MD;  Location: [facility];  Service: General;  Laterality: N/A;  . Eye surgery  2013    tube placed Left ,tear duct   Social History:   reports that she has never smoked. She has never used smokeless tobacco. She reports that she does not drink alcohol or use illicit drugs.  Family History  Problem Relation Age of Onset  . Heart disease  Mother     31s, CHF  . Heart disease Father     d/o MI at 14  . Cancer Father     skin  . Cancer Sister     breast  . Heart disease Sister     CABG in mid-60s    Medications: Patient's Medications  New Prescriptions   No medications on file  Previous Medications   ALPRAZOLAM (XANAX) 0.25 MG TABLET    Take 0.25 mg by mouth at bedtime as needed for sleep or anxiety.    CALCIUM CARBONATE (OS-CAL) 600 MG TABS    Take 600 mg by mouth 2 (two) times daily with a meal.   CARBOXYMETHYLCELLULOSE (REFRESH PLUS) 0.5 % SOLN    1 drop 4 (four) times daily.    CHOLECALCIFEROL (VITAMIN D) 2000 UNITS TABLET    Take 2,000 Units by mouth 2 (two) times  daily.    DEXLANSOPRAZOLE (DEXILANT) 60 MG CAPSULE    Take 60 mg by mouth daily.   FEXOFENADINE (ALLEGRA) 180 MG TABLET    Take 180 mg by mouth daily.   FLUOXETINE (PROZAC) 20 MG CAPSULE    Take 20 mg by mouth daily.   LOVASTATIN (MEVACOR) 40 MG TABLET    Take 40 mg by mouth daily before breakfast.    MECLIZINE (ANTIVERT) 25 MG TABLET    Take 12.5-25 mg by mouth 3 (three) times daily as needed for dizziness.   OXYCODONE-ACETAMINOPHEN (PERCOCET/ROXICET) 5-325 MG PER TABLET    Take 1-2 tablets by mouth every 4 (four) hours as needed for moderate pain.   POLYETHYLENE GLYCOL (MIRALAX / GLYCOLAX) PACKET    Take 17 g by mouth as needed.   SODIUM CHLORIDE (OCEAN) 0.65 % SOLN NASAL SPRAY    Place 2 sprays into both nostrils as needed for congestion.  Modified Medications   No medications on file  Discontinued Medications   VALACYCLOVIR (VALTREX) 1000 MG TABLET    Take 1 tablet (1,000 mg total) by mouth 3 (three) times daily.     Physical Exam: Filed Vitals:   08/22/14 1420  BP: 115/68  Pulse: 76  Temp: 97.2 F (36.2 C)  Resp: 18    Physical Exam  Constitutional: She is oriented to person, place, and time. She appears well-developed and well-nourished. No distress.  HENT:  Head: Normocephalic and atraumatic.  Mouth/Throat: Oropharynx is clear and moist. No oropharyngeal exudate.  Eyes: Conjunctivae are normal. Pupils are equal, round, and reactive to light.  Neck: Normal range of motion. Neck supple.  Cardiovascular: Normal rate, regular rhythm and normal heart sounds.   Pulmonary/Chest: Effort normal and breath sounds normal.  Abdominal: Soft. Bowel sounds are normal.  Musculoskeletal: She exhibits tenderness (to knees). She exhibits no edema.  Neurological: She is alert and oriented to person, place, and time.  Skin: Skin is warm and dry. She is not diaphoretic.  Healing lumbar spine incision   Psychiatric: She has a normal mood and affect.    Labs reviewed: Basic Metabolic  Panel:  Recent Labs  09/27/13 0606 09/28/13 1000 07/28/14 0942  NA 128* 135* 139  K 5.0 4.3 4.4  CL 95* 96 99  CO2 25 23 28   GLUCOSE 90 141* 107*  BUN 20 17 6   CREATININE 0.97 0.91 0.86  CALCIUM 8.5 8.9 9.5   Liver Function Tests:  Recent Labs  09/20/13 0755 09/20/13 1426  AST 16 16  ALT 13 14  ALKPHOS 83 81  BILITOT 0.4 0.5  PROT 7.0 7.0  ALBUMIN 3.2* 3.1*   No results for input(s): LIPASE, AMYLASE in the last 8760 hours. No results for input(s): AMMONIA in the last 8760 hours. CBC:  Recent Labs  09/23/13 0540 09/24/13 0555 07/28/14 0942  WBC 6.1 4.9 7.7  HGB 11.3* 11.2* 12.8  HCT 35.1* 34.3* 38.6  MCV 91.6 90.0 85.4  PLT 163 159 216   TSH:  Recent Labs  09/20/13 1426  TSH 1.640   A1C: Lab Results  Component Value Date   HGBA1C 5.6 09/20/2013   Lipid Panel:  Recent Labs  09/21/13 0332  CHOL 166  HDL 55  LDLCALC 90  TRIG 107  CHOLHDL 3.0    Radiological Exams: Dg Lumbar Spine 2-3 Views  07/31/2014   CLINICAL DATA:  L4-5 and L5-S1 PLIF  EXAM: DG C-ARM 61-120 MIN; LUMBAR SPINE - 2-3 VIEW  COMPARISON:  None.  FLUOROSCOPY TIME:  Radiation Exposure Index (as provided by the fluoroscopic device): Not available  If the device does not provide the exposure index:  Fluoroscopy Time:  3 minutes 37 seconds  Number of Acquired Images:  2  FINDINGS: Pedicular screws are seen at L4, L5 and S1 with interbody fusion at both levels. No hardware abnormality is seen.  IMPRESSION: Status post fusion at L4-5 and L5-S1.   Electronically Signed   By: Inez Catalina M.D.   On: 07/31/2014 15:48   Dg C-arm 61-120 Min  07/31/2014   CLINICAL DATA:  L4-5 and L5-S1 PLIF  EXAM: DG C-ARM 61-120 MIN; LUMBAR SPINE - 2-3 VIEW  COMPARISON:  None.  FLUOROSCOPY TIME:  Radiation Exposure Index (as provided by the fluoroscopic device): Not available  If the device does not provide the exposure index:  Fluoroscopy Time:  3 minutes 37 seconds  Number of Acquired Images:  2  FINDINGS:  Pedicular screws are seen at L4, L5 and S1 with interbody fusion at both levels. No hardware abnormality is seen.  IMPRESSION: Status post fusion at L4-5 and L5-S1.   Electronically Signed   By: Inez Catalina M.D.   On: 07/31/2014 15:48   US Breast Ltd Uni Right Inc Axilla  07/30/2014   CLINICAL DATA:  Questioned palpable finding right upper outer quadrant. The patient is having back surgery tomorrow.  EXAM: DIGITAL DIAGNOSTIC bilateral MAMMOGRAM WITH 3D TOMOSYNTHESIS WITH CAD  ULTRASOUND right BREAST  COMPARISON:  The most recent prior exam is dated 05/22/2012  ACR Breast Density Category b: There are scattered areas of fibroglandular density.  FINDINGS: There is a spiculated 2 cm irregular mass in the right breast upper outer quadrant corresponding to the palpable finding, with internal fine pleomorphic calcifications. Round scattered calcifications are noted elsewhere in both breasts, unchanged. No new abnormality is identified in the left breast.  Mammographic images were processed with CAD.  On physical exam, there is a palpable hard mass right breast upper outer quadrant.  Targeted ultrasound is performed, showing an irregular spiculated shadowing hypoechoic mass in the right breast 10 o'clock location 4 cm from the nipple measuring 1.7 x 1.3 x 1.3 cm. This corresponds to the palpable and mammographic finding. In the right axilla, there are several lymph nodes which are normal in size measuring up to 4 mm in maximal short axis diameter, but with mild cortical ovulation. Compared to the contralateral axilla, this is not significantly different.  IMPRESSION: Suspicious right breast mass 10 o'clock location. Ultrasound-guided core biopsy is deferred until after the patient recovers from back surgery to be performed tomorrow and will be  scheduled at her convenience for 4 weeks from now.  No evidence for malignancy in the left breast.  RECOMMENDATION: Right ultrasound-guided core biopsy  I have discussed the  findings and recommendations with the patient. Results were also provided in writing at the conclusion of the visit. If applicable, a reminder letter will be sent to the patient regarding the next appointment.  BI-RADS CATEGORY  5: Highly suggestive of malignancy.   Electronically Signed   By: Conchita Paris M.D.   On: 07/30/2014 10:46   Mm Diag Breast Tomo Bilateral  07/30/2014   CLINICAL DATA:  Questioned palpable finding right upper outer quadrant. The patient is having back surgery tomorrow.  EXAM: DIGITAL DIAGNOSTIC bilateral MAMMOGRAM WITH 3D TOMOSYNTHESIS WITH CAD  ULTRASOUND right BREAST  COMPARISON:  The most recent prior exam is dated 05/22/2012  ACR Breast Density Category b: There are scattered areas of fibroglandular density.  FINDINGS: There is a spiculated 2 cm irregular mass in the right breast upper outer quadrant corresponding to the palpable finding, with internal fine pleomorphic calcifications. Round scattered calcifications are noted elsewhere in both breasts, unchanged. No new abnormality is identified in the left breast.  Mammographic images were processed with CAD.  On physical exam, there is a palpable hard mass right breast upper outer quadrant.  Targeted ultrasound is performed, showing an irregular spiculated shadowing hypoechoic mass in the right breast 10 o'clock location 4 cm from the nipple measuring 1.7 x 1.3 x 1.3 cm. This corresponds to the palpable and mammographic finding. In the right axilla, there are several lymph nodes which are normal in size measuring up to 4 mm in maximal short axis diameter, but with mild cortical ovulation. Compared to the contralateral axilla, this is not significantly different.  IMPRESSION: Suspicious right breast mass 10 o'clock location. Ultrasound-guided core biopsy is deferred until after the patient recovers from back surgery to be performed tomorrow and will be scheduled at her convenience for 4 weeks from now.  No evidence for malignancy  in the left breast.  RECOMMENDATION: Right ultrasound-guided core biopsy  I have discussed the findings and recommendations with the patient. Results were also provided in writing at the conclusion of the visit. If applicable, a reminder letter will be sent to the patient regarding the next appointment.  BI-RADS CATEGORY  5: Highly suggestive of malignancy.   Electronically Signed   By: Conchita Paris M.D.   On: 07/30/2014 10:46   Assessment/Plan 1. Arthritis -pain in multiple joints, following with ortho -conts to use oxycodone and mobic for pain due to OA   2. Gastroesophageal reflux disease without esophagitis conts on dexilant   3. Hypercholesteremia conts on lovastain  4.Anxiety state -stable on prozac and ativan, to cont  5. Constipation, unspecified constipation type -worse due to pain medications, to use miralax daily  6.  Lumbar spinal stenosis S/p level 2 fusion. pt is stable for discharge home with husband-will need PT/OT per home health. No DME needed. Rx written.    7. Right breast mass  Has outpatient biopsy scheduled for next week

## 2014-08-23 ENCOUNTER — Encounter: Payer: Self-pay | Admitting: Internal Medicine

## 2014-08-23 NOTE — Assessment & Plan Note (Signed)
LDL -90 and HDL -55 on mevacor 40 mg daily.

## 2014-08-23 NOTE — Assessment & Plan Note (Signed)
Controlled on dexilant 60 mg daily

## 2014-08-23 NOTE — Assessment & Plan Note (Signed)
S/p a two-level posterior lumbar interbody fusion with pedicle screw fixation; admitted for OT/PT

## 2014-08-23 NOTE — Assessment & Plan Note (Signed)
Continue prozac and xanax q HS.

## 2014-08-27 ENCOUNTER — Ambulatory Visit
Admission: RE | Admit: 2014-08-27 | Discharge: 2014-08-27 | Disposition: A | Discharge status: Home / Self Care | Payer: Medicare Other | Source: Ambulatory Visit | Attending: Family Medicine | Admitting: Family Medicine

## 2014-08-27 ENCOUNTER — Other Ambulatory Visit: Payer: Self-pay | Admitting: Family Medicine

## 2014-08-27 DIAGNOSIS — N6310 Unspecified lump in the right breast, unspecified quadrant: Secondary | ICD-10-CM

## 2014-08-28 ENCOUNTER — Other Ambulatory Visit: Payer: Self-pay | Admitting: Family Medicine

## 2014-08-28 DIAGNOSIS — C50911 Malignant neoplasm of unspecified site of right female breast: Secondary | ICD-10-CM

## 2014-08-28 DIAGNOSIS — D0511 Intraductal carcinoma in situ of right breast: Secondary | ICD-10-CM

## 2014-09-03 ENCOUNTER — Other Ambulatory Visit (HOSPITAL_COMMUNITY): Payer: Self-pay | Admitting: Neurosurgery

## 2014-09-03 ENCOUNTER — Ambulatory Visit (HOSPITAL_COMMUNITY)
Admission: RE | Admit: 2014-09-03 | Discharge: 2014-09-03 | Disposition: A | Discharge status: Home / Self Care | Payer: Medicare Other | Source: Ambulatory Visit | Attending: Vascular Surgery | Admitting: Vascular Surgery

## 2014-09-03 DIAGNOSIS — M7989 Other specified soft tissue disorders: Secondary | ICD-10-CM | POA: Diagnosis not present

## 2014-09-05 ENCOUNTER — Telehealth: Payer: Self-pay | Admitting: *Deleted

## 2014-09-05 NOTE — Telephone Encounter (Signed)
[facility] pharmacist called regarding patient's spouse came into pharmacy with a script from February for oxycodone but it clearly showed that the script had been altered to 10 mg. She stated that she informed the patient's spouse that she could not fill this prescription now that it had been altered, he stated that NP Dewaine Oats had told him to change it to 10 mg when the patient's script was for 5- 325 mg. The pharmacist called to give [place] a heads up that the spouse was coming to get a new script.

## 2014-09-08 ENCOUNTER — Telehealth: Payer: Self-pay | Admitting: *Deleted

## 2014-09-08 NOTE — Telephone Encounter (Signed)
Received referral from [facility].  Received appt from Dr. Lindi Adie.  Called pt and confirmed 09/10/14 appt w/ her. Unable to mail before appt letter - gave verbal.  Unable to mail welcoming packet - gave directions and instructions.  Unable to mail intake form - placed a note for one to be given at time of check in.  Emailed Engineer, civil (consulting) at [employer] to make her aware.  Placed a copy of records in Dr. Geralyn Flash box and took one to HIM to scan.

## 2014-09-09 DIAGNOSIS — C50411 Malignant neoplasm of upper-outer quadrant of right female breast: Secondary | ICD-10-CM | POA: Insufficient documentation

## 2014-09-09 NOTE — Assessment & Plan Note (Signed)
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  Radiology and Pathology counseling: Discussed with the patient, the details of pathology including the type of breast cancer,the clinical staging, the significance of ER, PR and HER-2/neu receptors and the implications for treatment. After reviewing the pathology in detail, we proceeded to discuss the different treatment options between surgery, radiation, chemotherapy.  Recommendation: 1. MRI breast 2. Neo-adjuvant vs Adjuvant chemotherapy 3. BCS foll by 4. Adjuvant XRT  Chemo Counseling: I have discussed the risks and benefits of chemotherapy including the risks of nausea/ vomiting, risk of infection from low WBC count, fatigue due to chemo or anemia, bruising or bleeding due to low platelets, mouth sores, loss/ change in taste and decreased appetite. Liver and kidney function will be monitored through out chemotherapy as abnormalities in liver and kidney function may be a side effect of treatment. Cardiac dysfunction due to Adriamycin were discussed in detail. Risk of permanent bone marrow dysfunction and leukemia due to chemo were also discussed.

## 2014-09-10 ENCOUNTER — Other Ambulatory Visit: Payer: Self-pay | Admitting: General Surgery

## 2014-09-10 ENCOUNTER — Encounter: Payer: Self-pay | Admitting: *Deleted

## 2014-09-10 ENCOUNTER — Ambulatory Visit: Payer: Medicare Other

## 2014-09-10 ENCOUNTER — Ambulatory Visit (HOSPITAL_BASED_OUTPATIENT_CLINIC_OR_DEPARTMENT_OTHER): Payer: Medicare Other | Admitting: Hematology and Oncology

## 2014-09-10 VITALS — BP 153/95 | HR 101 | Temp 99.4°F | Resp 19 | Ht 60.0 in | Wt 171.4 lb

## 2014-09-10 DIAGNOSIS — C50411 Malignant neoplasm of upper-outer quadrant of right female breast: Secondary | ICD-10-CM

## 2014-09-10 DIAGNOSIS — C50911 Malignant neoplasm of unspecified site of right female breast: Secondary | ICD-10-CM

## 2014-09-10 DIAGNOSIS — Z171 Estrogen receptor negative status [ER-]: Secondary | ICD-10-CM

## 2014-09-10 NOTE — Progress Notes (Signed)
[facility] CONSULT NOTE  Patient Care Team: Gaynelle Arabian, MD as PCP - General (Family Medicine) Jerline Pain, MD as Consulting Physician (Cardiology) Acquanetta Sit, MD as Consulting Physician (Gastroenterology) Ralene Ok, MD as Consulting Physician (Surgery)  CHIEF COMPLAINTS/PURPOSE OF CONSULTATION:  Newly diagnosed breast cancer  HISTORY OF PRESENTING ILLNESS:  Angela Bond 69 y.o. female is here because of recent diagnosis of right breast cancer. Patient had a palpable right breast lesion with a prior history of benign biopsies on the right breast. A 10:00 position of 1.7 cm mass was identified by ultrasound. 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 There was evidence of lymphovascular invasion, ER 0% PR 0% HER-2 negative with a Ki-67 of 30%. She had a recent back surgery about a month ago and could not lie down for a breast MRI. She was seen by Dr. Marlou Starks who plans to do lumpectomy and has been referred to me for discussion regarding adjuvant treatment options. She is accompanied by her husband and reports that she is very sensitive to chemicals and medications. She tends to get nauseated very quickly. She is also very anxious because her sister died of breast cancer after being free of breast cancer for 15 years and had metastatic disease and passed away.  I reviewed her records extensively and collaborated the history with the patient.  SUMMARY OF ONCOLOGIC HISTORY:   Breast cancer of upper-outer quadrant of right female breast   07/30/2014 Mammogram Right breast 10 o'clock location 4 cm from the nipple measuring 1.7 x 1.3 x 1.3 cm. This corresponds to the palpable and mammographic finding. In the right axilla, there are several lymph nodes which are normal in size measuring up to 65m.   08/27/2014 Initial Diagnosis Right Breast 10:00: IDC with LVI, Grade 3, Er 0%, PR 0%, Her 2 Neg, Ki 67: 30%    MEDICAL  HISTORY:  Past Medical History  Diagnosis Date  . Arthritis   . Hypertension   . GERD (gastroesophageal reflux disease)   . Hypercholesteremia   . Heart murmur   . Anxiety and depression   . H/O hiatal hernia   . Headache(784.0)     sinus  . Dysrhythmia   . Shortness of breath     "stomach is in lungs" when had hernia 2014 none now(07/28/14)  . Pneumonia     hx  . Depression   . Anxiety   . Anemia     hx  . Shingles 5/15    hx    SURGICAL HISTORY: Past Surgical History  Procedure Laterality Date  . Total knee arthroplasty Bilateral   . Esophageal manometry N/A 07/02/2012    Procedure: ESOPHAGEAL MANOMETRY (EM);  Surgeon: VLear Ng MD;  Location: [facility];  Service: Endoscopy;  Laterality: N/A;  schooler to read  . Joint replacement Bilateral 2011,2012    knees  . Breast surgery      r breast biopsy  . Tonsillectomy  as child  . Hiatal hernia repair N/A 08/10/2012    Procedure: LAPAROSCOPIC REPAIR OF HIATAL HERNIA WITH MESH AND EGD WITH PEG TUBE PLACEMENT;  Surgeon: ARalene Ok MD;  Location: [facility];  Service: General;  Laterality: N/A;  . Eye surgery  2013    tube placed Left ,tear duct    SOCIAL HISTORY: History   Social History  . Marital Status: Married    Spouse Name: N/A  . Number of Children:  N/A  . Years of Education: N/A   Occupational History  . Not on file.   Social History Main Topics  . Smoking status: Never Smoker   . Smokeless tobacco: Never Used  . Alcohol Use: No  . Drug Use: No  . Sexual Activity: Not on file   Other Topics Concern  . Not on file   Social History Narrative   Lives with husband.  Does not use assist device.      FAMILY HISTORY: Family History  Problem Relation Age of Onset  . Heart disease Mother     8s, CHF  . Heart disease Father     d/o MI at 25  . Cancer Father     skin  . Cancer Sister     breast  . Heart disease Sister     CABG in mid-60s    ALLERGIES:  is allergic to lactose  intolerance (gi); aleve; aspirin; ciprofloxacin; doxycycline; evista; fosamax; pravachol; sulfa antibiotics; welchol; and zocor.  MEDICATIONS:  Current Outpatient Prescriptions  Medication Sig Dispense Refill  . ALPRAZolam (XANAX) 0.25 MG tablet Take 0.25 mg by mouth at bedtime as needed for sleep or anxiety.     . calcium carbonate (OS-CAL) 600 MG TABS Take 600 mg by mouth 2 (two) times daily with a meal.    . carboxymethylcellulose (REFRESH PLUS) 0.5 % SOLN 1 drop 4 (four) times daily.     . Cholecalciferol (VITAMIN D) 2000 UNITS tablet Take 2,000 Units by mouth 2 (two) times daily.     Marland Kitchen dexlansoprazole (DEXILANT) 60 MG capsule Take 60 mg by mouth daily.    . fexofenadine (ALLEGRA) 180 MG tablet Take 180 mg by mouth daily.    Marland Kitchen FLUoxetine (PROZAC) 20 MG capsule Take 20 mg by mouth daily.  11  . gabapentin (NEURONTIN) 300 MG capsule     . lovastatin (MEVACOR) 40 MG tablet Take 40 mg by mouth daily before breakfast.     . meclizine (ANTIVERT) 25 MG tablet Take 12.5-25 mg by mouth 3 (three) times daily as needed for dizziness.    . meloxicam (MOBIC) 15 MG tablet     . oxyCODONE-acetaminophen (PERCOCET/ROXICET) 5-325 MG per tablet Take 1-2 tablets by mouth every 4 (four) hours as needed for moderate pain. 65 tablet 0  . polyethylene glycol (MIRALAX / GLYCOLAX) packet Take 17 g by mouth as needed.    . sodium chloride (OCEAN) 0.65 % SOLN nasal spray Place 2 sprays into both nostrils as needed for congestion.     No current facility-administered medications for this visit.    REVIEW OF SYSTEMS:   Constitutional: Denies fevers, chills or abnormal night sweats Eyes: Denies blurriness of vision, double vision or watery eyes Ears, nose, mouth, throat, and face: Denies mucositis or sore throat Respiratory: Denies cough, dyspnea or wheezes Cardiovascular: Denies palpitation, chest discomfort or lower extremity swelling Gastrointestinal:  Denies nausea, heartburn or change in bowel habits Skin:  Denies abnormal skin rashes Lymphatics: Denies new lymphadenopathy or easy bruising Neurological: Neuropathic pain down her legs related to her back problems Behavioral/Psych: Mood is stable, no new changes  Breast: Palpable lump in the right breast All other systems were reviewed with the patient and are negative.  PHYSICAL EXAMINATION: ECOG PERFORMANCE STATUS: 1 - Symptomatic but completely ambulatory  Filed Vitals:   09/10/14 1335  BP: 153/95  Pulse: 101  Temp: 99.4 F (37.4 C)  Resp: 19   Filed Weights   09/10/14 1335  Weight: 171 lb  7 oz (77.764 kg)    GENERAL:alert, no distress and comfortable SKIN: skin color, texture, turgor are normal, no rashes or significant lesions EYES: normal, conjunctiva are pink and non-injected, sclera clear OROPHARYNX:no exudate, no erythema and lips, buccal mucosa, and tongue normal  NECK: supple, thyroid normal size, non-tender, without nodularity LYMPH:  no palpable lymphadenopathy in the cervical, axillary or inguinal LUNGS: clear to auscultation and percussion with normal breathing effort HEART: regular rate & rhythm and no murmurs and no lower extremity edema ABDOMEN:abdomen soft, non-tender and normal bowel sounds Musculoskeletal:no cyanosis of digits and no clubbing  PSYCH: alert & oriented x 3 with fluent speech NEURO: no focal motor/sensory deficits BREAST: Palpable nodule in the right breast freely mobile. No palpable axillary or supraclavicular lymphadenopathy (exam performed in the presence of a chaperone)   LABORATORY DATA:  I have reviewed the data as listed Lab Results  Component Value Date   WBC 7.7 07/28/2014   HGB 12.8 07/28/2014   HCT 38.6 07/28/2014   MCV 85.4 07/28/2014   PLT 216 07/28/2014   Lab Results  Component Value Date   NA 139 07/28/2014   K 4.4 07/28/2014   CL 99 07/28/2014   CO2 28 07/28/2014    RADIOGRAPHIC STUDIES: I have personally reviewed the radiological reports and agreed with the  findings in the report.  ASSESSMENT AND PLAN:  Breast cancer of upper-outer quadrant of right female breast Right Breast 10:00: IDC with LVI, Grade 3, Er 0%, PR 0%, Her 2 Neg, Ki 67: 30% Biopsied on 08/27/14, U/S revealed 1.7 cm mass. T1cN0 M0 stage IA clinical stage  Radiology and Pathology counseling: Discussed with the patient, the details of pathology including the type of breast cancer,the clinical staging, the significance of ER, PR and HER-2/neu receptors and the implications for treatment. After reviewing the pathology in detail, we proceeded to discuss the different treatment options between surgery, radiation, chemotherapy.  Recommendation: 1. [facility] foll by 2. Adjuvant chemotherapy with Taxotere Cytoxan 4 cycles (I chose his regimen because patient appears to be slightly older than her stated age and I'm concerned about toxicity to standard full dose chemotherapy with Adriamycin and Cytoxan followed by Taxol. In addition patient is also very reluctant to consider aggressive chemotherapy because she felt that her sister died because of toxicities of chemotherapy.) 3. Adjuvant XRT  Chemo Counseling: I have discussed the risks and benefits of chemotherapy including the risks of nausea/ vomiting, risk of infection from low WBC count, fatigue due to chemo or anemia, bruising or bleeding due to low platelets, mouth sores, loss/ change in taste and decreased appetite. Liver and kidney function will be monitored through out chemotherapy as abnormalities in liver and kidney function may be a side effect of treatment. Risk of permanent bone marrow dysfunction and leukemia due to chemo were also discussed.   I will see the patient back one week after surgery to discuss final pathology report and come up with final treatment plan.  All questions were answered. The patient knows to call the clinic with any problems, questions or concerns.    Rulon Eisenmenger, MD 2:05 PM   Hulan Amato called temperature 1  mg grades to be redirected

## 2014-09-10 NOTE — Progress Notes (Signed)
Note created by Dr. Gudena during office visit. Copy to patient, original to scan. 

## 2014-09-12 NOTE — Progress Notes (Signed)
Late entry from 09/10/14- Met with pt during new pt appt with Dr. Fredderick Erb navigation resources and contact information. Discussed care plan summary and written copy given. Encourage pt to call with questions or concerns. Received verbal understanding.

## 2014-09-18 ENCOUNTER — Other Ambulatory Visit: Payer: Self-pay

## 2014-09-18 ENCOUNTER — Telehealth: Payer: Self-pay

## 2014-09-18 ENCOUNTER — Telehealth: Payer: Self-pay | Admitting: Hematology and Oncology

## 2014-09-18 ENCOUNTER — Encounter (HOSPITAL_COMMUNITY): Payer: Self-pay

## 2014-09-18 ENCOUNTER — Encounter (HOSPITAL_COMMUNITY)
Admission: RE | Admit: 2014-09-18 | Discharge: 2014-09-18 | Disposition: A | Discharge status: Home / Self Care | Payer: Medicare Other | Source: Ambulatory Visit | Attending: General Surgery | Admitting: General Surgery

## 2014-09-18 DIAGNOSIS — Z01818 Encounter for other preprocedural examination: Secondary | ICD-10-CM | POA: Insufficient documentation

## 2014-09-18 DIAGNOSIS — Z01812 Encounter for preprocedural laboratory examination: Secondary | ICD-10-CM | POA: Insufficient documentation

## 2014-09-18 DIAGNOSIS — C50911 Malignant neoplasm of unspecified site of right female breast: Secondary | ICD-10-CM | POA: Insufficient documentation

## 2014-09-18 LAB — CBC
HCT: 35.2 % — ABNORMAL LOW (ref 36.0–46.0)
HEMOGLOBIN: 11.1 g/dL — AB (ref 12.0–15.0)
MCH: 26.7 pg (ref 26.0–34.0)
MCHC: 31.5 g/dL (ref 30.0–36.0)
MCV: 84.8 fL (ref 78.0–100.0)
PLATELETS: 227 10*3/uL (ref 150–400)
RBC: 4.15 MIL/uL (ref 3.87–5.11)
RDW: 13.7 % (ref 11.5–15.5)
WBC: 7.6 10*3/uL (ref 4.0–10.5)

## 2014-09-18 LAB — BASIC METABOLIC PANEL
ANION GAP: 12 (ref 5–15)
BUN: 12 mg/dL (ref 6–20)
CALCIUM: 9.2 mg/dL (ref 8.9–10.3)
CHLORIDE: 100 mmol/L — AB (ref 101–111)
CO2: 26 mmol/L (ref 22–32)
CREATININE: 0.86 mg/dL (ref 0.44–1.00)
Glucose, Bld: 93 mg/dL (ref 70–99)
Potassium: 3.5 mmol/L (ref 3.5–5.1)
Sodium: 138 mmol/L (ref 135–145)

## 2014-09-18 LAB — SURGICAL PCR SCREEN
MRSA, PCR: NEGATIVE
STAPHYLOCOCCUS AUREUS: NEGATIVE

## 2014-09-18 NOTE — Telephone Encounter (Signed)
Picked up incoming call.  Caller stated she is having surgery Monday and how long does she have to wait before starting chemo.  After getting the patient's name and DOB, this RN advised the patient that the interim between surgery and chemo varied according to the patient, surgery, complications/healing.  Let pt know that Dr. Lindi Adie will usually see his patient about a week after surgery to discuss pathology and planning and schedule going forward.  Pt reports she has already been given an appt by scheduling for after her surgery.    Pt reports she has had back surgery, has to wear a brace that comes up underneath her breasts until the end of May, and has been told she needs to have an additional back surgery.  Her back surgeon is Dr. Delorise Shiner.  Pt reports she is not sure if Dr. Hal Neer and Dr. Marlou Starks have communicated.  Pt is "scared to death" about the surgery,    Pt reports her husband has to have surgery for partial removal of his lung and is not allowed to drive.  She knows she will not be able to drive with chemo and is concerned about getting to appts.    Pt states "this is all happening too fast, I am overwhelmed, if I have to have one more surgery I am going to kill myself because I just can't take anymore surgeries.".  Advised patient that I would contact [facility] about her back surgery, and that I would also refer her to our Social Work, Soil scientist and [employer] for support services.  Pt states "she doesn't want anyone else coming in to her house".  I let patient know that was a discussion she could have with the support resources once they had contacted her.   I let the patient know she could call us if she needs [place] further.    Called [facility] and spoke with Claiborne Billings in triage re: back surgery.  Provided her name of patient's back surgeon.  Claiborne Billings stated she will provide information to Dr. Marlou Starks.   Placed referral to Social Work, Veterinary surgeon and [employer].  Inbasket sent to  [facility]

## 2014-09-18 NOTE — Progress Notes (Addendum)
During PAT visit pt informed me that she has been using mupirocin for the past 5 days because she had to do it last time and she wanted to make sure she was clear this time. Verified with Isaias Sakai in the lab that it was okay to still do the PCR even though she had been using the mupirocin, and she called back to say yes.  Patient stated "I'm not having any more surgeries after this surgery" and "I hate [place]".  I asked if there was something we had done or if it's what is going on with her health, and if there's anything we can do for her. She stated "no".

## 2014-09-18 NOTE — Pre-Procedure Instructions (Signed)
Angela Bond  09/18/2014   Your procedure is scheduled on:  Monday Sep 22, 2014 at 12:16 PM.  Report to [facility] Admitting at 10:15 AM.  Call this number if you have problems the morning of surgery: [phone]   Remember:   Do not eat food or drink liquids after midnight.   Take these medicines the morning of surgery with A SIP OF WATER: Alprazolam (Xanax) if needed, Eye drops, Fexofenadine (Allegra), Fluoxetine (Prozac), Meclizine (Antivert) if needed for dizziness, and nasal spray if needed   Please stop taking any aspirin, ibuprofen, Meloxicam/Mobic, Advil, Motrin, herbal medications etc    Do not wear jewelry, make-up or nail polish.  Do not wear lotions, powders, or perfumes. You may NOT wear deodorant.  Do not shave 48 hours prior to surgery.   Do not bring valuables to the hospital.  [facility] is not responsible for any belongings or valuables.               Contacts, dentures or bridgework may not be worn into surgery.  Leave suitcase in the car. After surgery it may be brought to your room.  For patients admitted to the hospital, discharge time is determined by your treatment team.               Patients discharged the day of surgery will not be allowed to drive home.  Name and phone number of your driver:   Special Instructions: Shower using CHG soap the night before and the morning of your surgery   Please read over the following fact sheets that you were given: Pain Booklet, Coughing and Deep Breathing and Surgical Site Infection Prevention

## 2014-09-18 NOTE — Telephone Encounter (Signed)
Called patient with follow up appointment

## 2014-09-18 NOTE — Progress Notes (Signed)
Patient arrived to PAT office with husband. During PAT visit while Nurse was reviewing allergies with patient refused to review allergies and stated "you should already have that stuff in there!" Next Nurse attempted to review past surgical history and current medical history and patient kept cutting Nurse off and giving short answers. Nurse then skipped to OSA screen and ask patient what her neck size was. Patient replied "what!? What are you talking about my neck size?!" Nurse then stated that her neck size was taken with her vital signs and I needed to know what the number was and patient stated "I don't know!" Nurse asked patient if she would refuse to have a blood transfusion if she needed it and she stated "yes I refuse.Marland KitchenMarland KitchenMarland KitchenI don't want it!" Nurse then gave patient a refusal of blood products consent form to sign. Patient leaned forward, read the consent, sat back in chair, and crossed her arms. Nurse asked patient to sign form so that it could be placed on chart. Patient then picked up form, ripped it up, and threw it at Nurse and stated "I changed my mind!" Patients husband looked at patient and stated "why are you acting like this? Why did you just rip that form up like that?" Patient did not respond, she only sat in chair with her arms folded looking at her husband. Nurse remained calm and tired to proceed with PAT. Nurse then went over DOS instructions with patient and while doing so patient interrupted Nurse and stated "I just got these nails painted and I'm NOT taking the polish off!" Nurse explained to patient that I was only doing my job by telling her not to wear it. Nurse then informed patient that a PCR swab would be collected today and patient stated "No you're not doing that! They didn't do it last time and you aren't going to do it today!" Nurse then proceeded to explain CHG instructions, while doing so patient interrupted Nurse and slid the bottle of soap across the table towards Nurse and  stated "I don't take showers!" Nurse then slid the CHG soap back across table towards patient and instructed her to bathe with the CHG soap. Finally Nurse excused herself from room and called Sharyn Lull, charge Nurse in to finish PAT. Sharyn Lull, RN came to chair side to finish PAT.

## 2014-09-18 NOTE — Progress Notes (Signed)
Patient returned to PAT room with Levada Dy (lab tech) and apologized to Nurse for acting like she did. Nurse stated "I hope everything goes well with your surgery." Levada Dy (lab tech) closed door and patient then left PAT with her husband.

## 2014-09-18 NOTE — Progress Notes (Deleted)
Patient returned to PAT room with Angela Bond (lab tech) and apologized to Nurse to acting like she did. Nurse stated "I hope everything goes well with your surgery." Angela Bond (lab tech) closed door and patient then left PAT with her husband.

## 2014-09-19 NOTE — Progress Notes (Signed)
Pt notified of time change;to arrive at 1000.Verbalized understanding.

## 2014-09-21 MED ORDER — CEFAZOLIN SODIUM-DEXTROSE 2-3 GM-% IV SOLR
2.0000 g | INTRAVENOUS | Status: AC
  Administered 2014-09-22: 2 g via INTRAVENOUS
  Filled 2014-09-21: qty 50

## 2014-09-22 ENCOUNTER — Ambulatory Visit (HOSPITAL_COMMUNITY): Payer: Medicare Other

## 2014-09-22 ENCOUNTER — Ambulatory Visit (HOSPITAL_COMMUNITY): Payer: Medicare Other | Admitting: Certified Registered"

## 2014-09-22 ENCOUNTER — Ambulatory Visit (HOSPITAL_COMMUNITY): Payer: Medicare Other | Admitting: Vascular Surgery

## 2014-09-22 ENCOUNTER — Encounter (HOSPITAL_COMMUNITY): Payer: Self-pay | Admitting: *Deleted

## 2014-09-22 ENCOUNTER — Encounter (HOSPITAL_COMMUNITY)
Admission: RE | Admit: 2014-09-22 | Discharge: 2014-09-22 | Disposition: A | Discharge status: Home / Self Care | Payer: Medicare Other | Source: Ambulatory Visit | Attending: General Surgery | Admitting: General Surgery

## 2014-09-22 ENCOUNTER — Ambulatory Visit (HOSPITAL_COMMUNITY)
Admission: RE | Admit: 2014-09-22 | Discharge: 2014-09-22 | Disposition: A | Discharge status: Home / Self Care | Payer: Medicare Other | Source: Ambulatory Visit | Attending: General Surgery | Admitting: General Surgery

## 2014-09-22 ENCOUNTER — Encounter (HOSPITAL_COMMUNITY)
Admission: RE | Disposition: A | Discharge status: Home / Self Care | Payer: Self-pay | Source: Ambulatory Visit | Attending: General Surgery

## 2014-09-22 DIAGNOSIS — Z95828 Presence of other vascular implants and grafts: Secondary | ICD-10-CM

## 2014-09-22 DIAGNOSIS — E780 Pure hypercholesterolemia: Secondary | ICD-10-CM | POA: Diagnosis not present

## 2014-09-22 DIAGNOSIS — C50911 Malignant neoplasm of unspecified site of right female breast: Secondary | ICD-10-CM

## 2014-09-22 DIAGNOSIS — Z91013 Allergy to seafood: Secondary | ICD-10-CM | POA: Insufficient documentation

## 2014-09-22 DIAGNOSIS — F329 Major depressive disorder, single episode, unspecified: Secondary | ICD-10-CM | POA: Insufficient documentation

## 2014-09-22 DIAGNOSIS — Z882 Allergy status to sulfonamides status: Secondary | ICD-10-CM | POA: Diagnosis not present

## 2014-09-22 DIAGNOSIS — K219 Gastro-esophageal reflux disease without esophagitis: Secondary | ICD-10-CM | POA: Insufficient documentation

## 2014-09-22 DIAGNOSIS — I4891 Unspecified atrial fibrillation: Secondary | ICD-10-CM | POA: Insufficient documentation

## 2014-09-22 DIAGNOSIS — M1990 Unspecified osteoarthritis, unspecified site: Secondary | ICD-10-CM | POA: Insufficient documentation

## 2014-09-22 DIAGNOSIS — C50411 Malignant neoplasm of upper-outer quadrant of right female breast: Secondary | ICD-10-CM | POA: Diagnosis not present

## 2014-09-22 DIAGNOSIS — C773 Secondary and unspecified malignant neoplasm of axilla and upper limb lymph nodes: Secondary | ICD-10-CM | POA: Insufficient documentation

## 2014-09-22 DIAGNOSIS — Z881 Allergy status to other antibiotic agents status: Secondary | ICD-10-CM | POA: Insufficient documentation

## 2014-09-22 DIAGNOSIS — Z888 Allergy status to other drugs, medicaments and biological substances status: Secondary | ICD-10-CM | POA: Diagnosis not present

## 2014-09-22 DIAGNOSIS — F419 Anxiety disorder, unspecified: Secondary | ICD-10-CM | POA: Diagnosis not present

## 2014-09-22 DIAGNOSIS — W19XXXA Unspecified fall, initial encounter: Secondary | ICD-10-CM

## 2014-09-22 HISTORY — PX: PORTACATH PLACEMENT: SHX2246

## 2014-09-22 HISTORY — PX: BREAST LUMPECTOMY WITH SENTINEL LYMPH NODE BIOPSY: SHX5597

## 2014-09-22 SURGERY — BREAST LUMPECTOMY WITH SENTINEL LYMPH NODE BX
Anesthesia: General | Site: Chest | Laterality: Right

## 2014-09-22 MED ORDER — FENTANYL CITRATE (PF) 250 MCG/5ML IJ SOLN
INTRAMUSCULAR | Status: AC
  Filled 2014-09-22: qty 5

## 2014-09-22 MED ORDER — LACTATED RINGERS IV SOLN
INTRAVENOUS | Status: DC | PRN
  Administered 2014-09-22 (×2): via INTRAVENOUS

## 2014-09-22 MED ORDER — FENTANYL CITRATE (PF) 100 MCG/2ML IJ SOLN
INTRAMUSCULAR | Status: AC
  Administered 2014-09-22: 100 ug
  Filled 2014-09-22: qty 2

## 2014-09-22 MED ORDER — CHLORHEXIDINE GLUCONATE 4 % EX LIQD
1.0000 "application " | Freq: Once | CUTANEOUS | Status: DC
  Filled 2014-09-22: qty 15

## 2014-09-22 MED ORDER — ONDANSETRON HCL 4 MG/2ML IJ SOLN
INTRAMUSCULAR | Status: DC | PRN
  Administered 2014-09-22: 4 mg via INTRAVENOUS

## 2014-09-22 MED ORDER — METHYLENE BLUE 1 % INJ SOLN
INTRAMUSCULAR | Status: AC
  Filled 2014-09-22: qty 10

## 2014-09-22 MED ORDER — ROCURONIUM BROMIDE 100 MG/10ML IV SOLN
INTRAVENOUS | Status: DC | PRN
  Administered 2014-09-22: 50 mg via INTRAVENOUS

## 2014-09-22 MED ORDER — NEOSTIGMINE METHYLSULFATE 10 MG/10ML IV SOLN
INTRAVENOUS | Status: DC | PRN
  Administered 2014-09-22: 4 mg via INTRAVENOUS

## 2014-09-22 MED ORDER — 0.9 % SODIUM CHLORIDE (POUR BTL) OPTIME
TOPICAL | Status: DC | PRN
  Administered 2014-09-22: 1000 mL

## 2014-09-22 MED ORDER — FENTANYL CITRATE (PF) 100 MCG/2ML IJ SOLN: 100.0000 ug | Freq: Once | INTRAMUSCULAR | Status: DC

## 2014-09-22 MED ORDER — GLYCOPYRROLATE 0.2 MG/ML IJ SOLN
INTRAMUSCULAR | Status: DC | PRN
  Administered 2014-09-22: 0.6 mg via INTRAVENOUS

## 2014-09-22 MED ORDER — TECHNETIUM TC 99M SULFUR COLLOID FILTERED
1.0000 | Freq: Once | INTRAVENOUS | Status: AC | PRN
  Administered 2014-09-22: 1 via INTRADERMAL

## 2014-09-22 MED ORDER — MIDAZOLAM HCL 2 MG/2ML IJ SOLN: 2.0000 mg | Freq: Once | INTRAMUSCULAR | Status: DC

## 2014-09-22 MED ORDER — MEPERIDINE HCL 25 MG/ML IJ SOLN: 6.2500 mg | INTRAMUSCULAR | Status: DC | PRN

## 2014-09-22 MED ORDER — HEPARIN SOD (PORK) LOCK FLUSH 100 UNIT/ML IV SOLN
INTRAVENOUS | Status: AC
  Filled 2014-09-22: qty 5

## 2014-09-22 MED ORDER — MIDAZOLAM HCL 2 MG/2ML IJ SOLN
INTRAMUSCULAR | Status: AC
  Filled 2014-09-22: qty 2

## 2014-09-22 MED ORDER — PROPOFOL 10 MG/ML IV BOLUS
INTRAVENOUS | Status: DC | PRN
  Administered 2014-09-22: 150 mg via INTRAVENOUS

## 2014-09-22 MED ORDER — SODIUM CHLORIDE 0.9 % IR SOLN
Status: DC | PRN
  Administered 2014-09-22: 500 mL

## 2014-09-22 MED ORDER — MIDAZOLAM HCL 2 MG/2ML IJ SOLN: 1.0000 mg | Freq: Once | INTRAMUSCULAR | Status: DC

## 2014-09-22 MED ORDER — MIDAZOLAM HCL 2 MG/2ML IJ SOLN
INTRAMUSCULAR | Status: AC
  Administered 2014-09-22: 2 mg
  Filled 2014-09-22: qty 2

## 2014-09-22 MED ORDER — OXYCODONE-ACETAMINOPHEN 5-325 MG PO TABS: 1.0000 | ORAL_TABLET | ORAL | Status: DC | PRN

## 2014-09-22 MED ORDER — BUPIVACAINE-EPINEPHRINE (PF) 0.25% -1:200000 IJ SOLN
INTRAMUSCULAR | Status: AC
  Filled 2014-09-22: qty 30

## 2014-09-22 MED ORDER — HYDROMORPHONE HCL 1 MG/ML IJ SOLN
INTRAMUSCULAR | Status: AC
  Administered 2014-09-22: 0.5 mg via INTRAVENOUS
  Filled 2014-09-22: qty 1

## 2014-09-22 MED ORDER — FENTANYL CITRATE (PF) 100 MCG/2ML IJ SOLN
INTRAMUSCULAR | Status: DC | PRN
  Administered 2014-09-22 (×2): 100 ug via INTRAVENOUS

## 2014-09-22 MED ORDER — LACTATED RINGERS IV SOLN
INTRAVENOUS | Status: DC
  Administered 2014-09-22: 10:00:00 via INTRAVENOUS

## 2014-09-22 MED ORDER — SODIUM CHLORIDE 0.9 % IJ SOLN
INTRAMUSCULAR | Status: AC
  Filled 2014-09-22: qty 10

## 2014-09-22 MED ORDER — HYDROMORPHONE HCL 1 MG/ML IJ SOLN
0.2500 mg | INTRAMUSCULAR | Status: DC | PRN
  Administered 2014-09-22 (×2): 0.5 mg via INTRAVENOUS

## 2014-09-22 MED ORDER — BUPIVACAINE-EPINEPHRINE 0.25% -1:200000 IJ SOLN
INTRAMUSCULAR | Status: DC | PRN
  Administered 2014-09-22: 28 mL

## 2014-09-22 MED ORDER — HEPARIN SOD (PORK) LOCK FLUSH 100 UNIT/ML IV SOLN
INTRAVENOUS | Status: DC | PRN
  Administered 2014-09-22: 400 [IU] via INTRAVENOUS

## 2014-09-22 MED ORDER — EPHEDRINE SULFATE 50 MG/ML IJ SOLN
INTRAMUSCULAR | Status: DC | PRN
  Administered 2014-09-22: 10 mg via INTRAVENOUS

## 2014-09-22 MED ORDER — MIDAZOLAM HCL 2 MG/2ML IJ SOLN
INTRAMUSCULAR | Status: AC
  Administered 2014-09-22: 1 mg
  Filled 2014-09-22: qty 2

## 2014-09-22 MED ORDER — ONDANSETRON HCL 4 MG/2ML IJ SOLN: 4.0000 mg | Freq: Once | INTRAMUSCULAR | Status: DC | PRN

## 2014-09-22 MED ORDER — PHENYLEPHRINE HCL 10 MG/ML IJ SOLN
INTRAMUSCULAR | Status: DC | PRN
  Administered 2014-09-22 (×2): 80 ug via INTRAVENOUS

## 2014-09-22 MED ORDER — LIDOCAINE HCL (CARDIAC) 20 MG/ML IV SOLN
INTRAVENOUS | Status: DC | PRN
  Administered 2014-09-22: 50 mg via INTRAVENOUS

## 2014-09-22 SURGICAL SUPPLY — 75 items
APPLIER CLIP 9.375 MED OPEN (MISCELLANEOUS) ×3
APR CLP MED 9.3 20 MLT OPN (MISCELLANEOUS) ×2
BAG DECANTER FOR FLEXI CONT (MISCELLANEOUS) ×3 IMPLANT
BINDER BREAST LRG (GAUZE/BANDAGES/DRESSINGS) IMPLANT
BINDER BREAST XLRG (GAUZE/BANDAGES/DRESSINGS) IMPLANT
BLADE SURG 11 STRL SS (BLADE) ×3 IMPLANT
BLADE SURG 15 STRL LF DISP TIS (BLADE) ×2 IMPLANT
BLADE SURG 15 STRL SS (BLADE) ×6
CANISTER SUCTION 2500CC (MISCELLANEOUS) ×3 IMPLANT
CHLORAPREP W/TINT 10.5 ML (MISCELLANEOUS) ×2 IMPLANT
CHLORAPREP W/TINT 26ML (MISCELLANEOUS) ×3 IMPLANT
CLIP APPLIE 9.375 MED OPEN (MISCELLANEOUS) IMPLANT
CONT SPEC 4OZ CLIKSEAL STRL BL (MISCELLANEOUS) ×4 IMPLANT
COVER PROBE W GEL 5X96 (DRAPES) ×3 IMPLANT
COVER SURGICAL LIGHT HANDLE (MISCELLANEOUS) ×3 IMPLANT
COVER TRANSDUCER ULTRASND GEL (DRAPE) IMPLANT
CRADLE DONUT ADULT HEAD (MISCELLANEOUS) ×3 IMPLANT
DEVICE DUBIN SPECIMEN MAMMOGRA (MISCELLANEOUS) ×1 IMPLANT
DRAPE C-ARM 42X72 X-RAY (DRAPES) ×3 IMPLANT
DRAPE CHEST BREAST 15X10 FENES (DRAPES) ×3 IMPLANT
DRAPE LAPAROSCOPIC ABDOMINAL (DRAPES) ×2 IMPLANT
DRAPE UTILITY XL STRL (DRAPES) ×5 IMPLANT
ELECT CAUTERY BLADE 6.4 (BLADE) ×3 IMPLANT
ELECT COATED BLADE 2.86 ST (ELECTRODE) ×3 IMPLANT
ELECT REM PT RETURN 9FT ADLT (ELECTROSURGICAL) ×3
ELECTRODE REM PT RTRN 9FT ADLT (ELECTROSURGICAL) ×2 IMPLANT
GAUZE SPONGE 4X4 16PLY XRAY LF (GAUZE/BANDAGES/DRESSINGS) ×3 IMPLANT
GEL ULTRASOUND 20GR AQUASONIC (MISCELLANEOUS) IMPLANT
GLOVE BIO SURGEON STRL SZ7.5 (GLOVE) ×3 IMPLANT
GLOVE BIOGEL PI IND STRL 7.0 (GLOVE) IMPLANT
GLOVE BIOGEL PI IND STRL 7.5 (GLOVE) IMPLANT
GLOVE BIOGEL PI INDICATOR 7.0 (GLOVE) ×2
GLOVE BIOGEL PI INDICATOR 7.5 (GLOVE) ×1
GLOVE SURG SS PI 7.0 STRL IVOR (GLOVE) ×2 IMPLANT
GOWN STRL REUS W/ TWL LRG LVL3 (GOWN DISPOSABLE) ×4 IMPLANT
GOWN STRL REUS W/TWL LRG LVL3 (GOWN DISPOSABLE) ×6
INTRODUCER COOK 11FR (CATHETERS) IMPLANT
KIT BASIN OR (CUSTOM PROCEDURE TRAY) ×3 IMPLANT
KIT MARKER MARGIN INK (KITS) ×1 IMPLANT
KIT PORT POWER 8FR ISP CVUE (Catheter) ×1 IMPLANT
KIT PORT POWER 9.6FR MRI PREA (Catheter) IMPLANT
KIT ROOM TURNOVER OR (KITS) ×3 IMPLANT
LIQUID BAND (GAUZE/BANDAGES/DRESSINGS) ×3 IMPLANT
NDL 18GX1X1/2 (RX/OR ONLY) (NEEDLE) ×2 IMPLANT
NDL HYPO 25GX1X1/2 BEV (NEEDLE) ×4 IMPLANT
NEEDLE 18GX1X1/2 (RX/OR ONLY) (NEEDLE) IMPLANT
NEEDLE 22X1 1/2 (OR ONLY) (NEEDLE) IMPLANT
NEEDLE HYPO 25GX1X1/2 BEV (NEEDLE) ×3 IMPLANT
NS IRRIG 1000ML POUR BTL (IV SOLUTION) ×3 IMPLANT
PACK SURGICAL SETUP 50X90 (CUSTOM PROCEDURE TRAY) ×3 IMPLANT
PAD ARMBOARD 7.5X6 YLW CONV (MISCELLANEOUS) ×5 IMPLANT
PENCIL BUTTON HOLSTER BLD 10FT (ELECTRODE) ×3 IMPLANT
SET INTRODUCER 12FR PACEMAKER (SHEATH) IMPLANT
SET SHEATH INTRODUCER 10FR (MISCELLANEOUS) IMPLANT
SHEATH COOK PEEL AWAY SET 9F (SHEATH) IMPLANT
SPONGE LAP 18X18 X RAY DECT (DISPOSABLE) ×3 IMPLANT
SUT MNCRL AB 4-0 PS2 18 (SUTURE) ×3 IMPLANT
SUT MON AB 4-0 PC3 18 (SUTURE) ×2 IMPLANT
SUT PROLENE 2 0 SH 30 (SUTURE) ×6 IMPLANT
SUT SILK 2 0 (SUTURE)
SUT SILK 2 0 SH (SUTURE) IMPLANT
SUT SILK 2-0 18XBRD TIE 12 (SUTURE) IMPLANT
SUT VIC AB 3-0 SH 18 (SUTURE) ×1 IMPLANT
SUT VIC AB 3-0 SH 27 (SUTURE) ×6
SUT VIC AB 3-0 SH 27XBRD (SUTURE) ×2 IMPLANT
SYR 20ML ECCENTRIC (SYRINGE) ×6 IMPLANT
SYR 5ML LUER SLIP (SYRINGE) ×3 IMPLANT
SYR BULB 3OZ (MISCELLANEOUS) ×3 IMPLANT
SYR CONTROL 10ML LL (SYRINGE) ×5 IMPLANT
SYRINGE 10CC LL (SYRINGE) IMPLANT
TOWEL OR 17X24 6PK STRL BLUE (TOWEL DISPOSABLE) ×3 IMPLANT
TOWEL OR 17X26 10 PK STRL BLUE (TOWEL DISPOSABLE) ×3 IMPLANT
TUBE CONNECTING 12X1/4 (SUCTIONS) ×3 IMPLANT
WATER STERILE IRR 1000ML POUR (IV SOLUTION) IMPLANT
YANKAUER SUCT BULB TIP NO VENT (SUCTIONS) ×3 IMPLANT

## 2014-09-22 NOTE — Op Note (Signed)
09/22/2014  1:59 PM  PATIENT:  Angela Bond  69 y.o. female  PRE-OPERATIVE DIAGNOSIS:  Right Breast Cancer  POST-OPERATIVE DIAGNOSIS:  Right Breast Cancer  PROCEDURE:  Procedure(s): RIGHT BREAST LUMPECTOMY  AND RIGHT AXILLARY SENTINEL LYMPH NODE BIOPSY (Right) INSERTION PORT-A-CATH (Left)  SURGEON:  Surgeon(s) and Role:    * Jovita Kussmaul, MD - Primary  PHYSICIAN ASSISTANT:   ASSISTANTS: none   ANESTHESIA:   general  EBL:  Total I/O In: 1300 [I.V.:1300] Out: -   BLOOD ADMINISTERED:none  DRAINS: none   LOCAL MEDICATIONS USED:  MARCAINE     SPECIMEN:  Source of Specimen:  right breast tissue and sentinel node X 2  DISPOSITION OF SPECIMEN:  PATHOLOGY  COUNTS:  YES  TOURNIQUET:  * No tourniquets in log *  DICTATION: .Dragon Dictation  After informed consent was obtained the patient was brought to the operating room and placed in the supine position on the operating room table. After adequate induction of general anesthesia the patient's bilateral chest, breast, and axillary areas were prepped with ChloraPrep, allowed to dry, and draped in usual sterile manner. Prior to this a roll was placed between the patient's shoulder blades to extend the shoulder slightly. The patient was placed in Trendelenburg position. The area lateral to the bend of the clavicle and the left chest was infiltrated with ChloraPrep and Marcaine. A small incision was made with a 15 blade knife in this area. A large bore needle from the Port-A-Cath kit was used to slide beneath the bend of the clavicle heading towards the sternal notch and in doing so we were able to access the left subclavian vein without difficulty. A wire was fed through the needle without difficulty. The wire was confirmed in the central venous system using real-time fluoroscopy. A subcutaneous pocket was created inferior to the incision using blunt finger dissection and some sharp dissection with the electrocautery. The tubing was  placed on the reservoir. The reservoir was placed in the pocket and the length of the tubing was estimated using real-time fluoroscopy. The tubing was cut to the appropriate length. Next the sheath and dilator were fed over the wire using the Seldinger technique without difficulty. The dilator and wire were removed from the patient. The tubing was fed through the sheath as far as it would go and held in place while the sheath was gently grasped and separated. The catheter appeared to be in the superior vena cava using real-time fluoroscopy images. The tubing was then permanently anchored to the reservoir. The reservoir was anchored in the pocket with 2 2-0 Prolene stitches. The port was then aspirated and it aspirated blood easily. It was then flushed initially with a dilute heparin solution and then with a more concentrated heparin solution. The subcutaneous tissue was closed over the port with interrupted 3-0 Vicryl stitches. The skin was closed with a running 4-0 Monocryl subcuticular stitch. Attention was then turned to the right breast. Previously the patient had been injected with 1 mCi of technetium sulfur colloid in the subareolar position on the right. The neoprobe was used to identify a hot spot in the right axilla. A small transversely oriented incision was made with a 15 blade knife overlying the hot spot. This incision was carried through the skin and subcutaneous tissue sharply with electrocautery until the axilla was entered. A [place] retractor was deployed. Using the neoprobe to direct blunt hemostat dissection we were able to identify 2 hot lymph nodes. These were excised sharply  with electrocautery and the lymphatics were controlled with clips. Ex vivo counts on these 2 nodes were approximately 600 and 100 respectively. These were sent as sentinel nodes #1 and 2. No other hot or palpable lymph nodes were identified in the right axilla. The wound was infiltrated with quarter percent Marcaine. The  deep layer was closed with interrupted 3-0 Vicryl stitches. Skin was then closed with a running 4-0 Monocryl subcuticular stitch. Attention was then turned to the right breast. In the upper outer quadrant there was a palpable mass. An elliptical incision was made in the skin overlying the mass. The incision was then carried through the skin and subcutaneous tissue sharply with the electrocautery. While checking the position of the mass frequently a circular portion of breast tissue was excised sharply around the palpable mass. This dissection was carried all the way to the chest wall. Once the mass was removed it was oriented with the appropriate paint colors. A specimen radiograph showed the clip to be in the center of the specimen. The specimen was then sent to pathology for further evaluation. The wound was then irrigated with saline and infiltrated with quarter percent Marcaine. The deep layer was closed with interrupted 3-0 Vicryl stitches. The skin was then closed with a running 4-0 Monocryl subcuticular stitch. Dermabond dressings were applied. The patient tolerated the procedure well. At the end of the case all needle sponge and instrument counts were correct. The patient was then awakened and taken to recovery in stable condition.  PLAN OF CARE: Discharge to home after PACU  PATIENT DISPOSITION:  PACU - hemodynamically stable.   Delay start of Pharmacological VTE agent (>24hrs) due to surgical blood loss or risk of bleeding: not applicable

## 2014-09-22 NOTE — Interval H&P Note (Signed)
History and Physical Interval Note:  09/22/2014 11:41 AM  Angela Bond  has presented today for surgery, with the diagnosis of Right Breast Cancer  The various methods of treatment have been discussed with the patient and family. After consideration of risks, benefits and other options for treatment, the patient has consented to  Procedure(s): RIGHT BREAST LUMPECTOMY WITH NEEDLE LOCALIZATION AND RIGHT AXILLARY SENTINEL LYMPH NODE BIOPSY (Right) INSERTION PORT-A-CATH (Left) as a surgical intervention .  The patient's history has been reviewed, patient examined, no change in status, stable for surgery.  I have reviewed the patient's chart and labs.  Questions were answered to the patient's satisfaction.     TOTH III,Zeric Baranowski S

## 2014-09-22 NOTE — Transfer of Care (Signed)
Immediate Anesthesia Transfer of Care Note  Patient: Angela Bond  Procedure(s) Performed: Procedure(s): RIGHT BREAST LUMPECTOMY WITH NEEDLE LOCALIZATION AND RIGHT AXILLARY SENTINEL LYMPH NODE BIOPSY (Right) INSERTION PORT-A-CATH (Left)  Patient Location: PACU  Anesthesia Type:General  Level of Consciousness: awake, alert  and oriented  Airway & Oxygen Therapy: Patient Spontanous Breathing and Patient connected to nasal cannula oxygen  Post-op Assessment: Report given to RN, Post -op Vital signs reviewed and stable and Patient moving all extremities X 4  Post vital signs: Reviewed and stable  Last Vitals:  Filed Vitals:   09/22/14 1406  BP:   Pulse:   Temp: 36.8 C  Resp:   HR 104, RR 21, Sats 100%, BP 056/97  Complications: No apparent anesthesia complications

## 2014-09-22 NOTE — Anesthesia Preprocedure Evaluation (Addendum)
Anesthesia Evaluation  Patient identified by MRN, date of birth, ID band Patient awake    Reviewed: Allergy & Precautions, NPO status , Patient's Chart, lab work & pertinent test results  Airway Mallampati: II  TM Distance: >3 FB Neck ROM: Full    Dental   Pulmonary    Pulmonary exam normal       Cardiovascular hypertension, Pt. on medications Normal cardiovascular exam ECHO 3/14 Study Conclusions  - Left ventricle: The cavity size was normal. Wall thickness was normal. Systolic function was normal. The estimated ejection fraction was in the range of 55% to 60%. Wall motion was normal; there were no regional wall motion abnormalities. Left ventricular diastolic function parameters were normal. No previous study for comparison. - Aortic valve: Mildly calcified annulus. Trileaflet; mildly calcified leaflets. Transvalvular velocity was increased more than expected to mild degree - no clear stenosis. - Mitral valve: Mildly thickened leaflets . Mild regurgitation. - Left atrium: The atrium was mildly dilated. - Tricuspid valve: Mild regurgitation. Peak RV-RA gradient: 45mm Hg (S). - Inferior vena cava: Not visualized. - Pericardium, extracardiac: There was no pericardial effusion.    Neuro/Psych Anxiety Depression    GI/Hepatic GERD-  Medicated and Controlled,  Endo/Other    Renal/GU      Musculoskeletal   Abdominal   Peds  Hematology   Anesthesia Other Findings   Reproductive/Obstetrics                            Anesthesia Physical Anesthesia Plan  ASA: III  Anesthesia Plan: General   Post-op Pain Management:    Induction: Intravenous  Airway Management Planned: LMA  Additional Equipment:   Intra-op Plan:   Post-operative Plan: Extubation in OR  Informed Consent: I have reviewed the patients History and Physical, chart, labs and discussed the procedure  including the risks, benefits and alternatives for the proposed anesthesia with the patient or authorized representative who has indicated his/her understanding and acceptance.     Plan Discussed with: CRNA and Surgeon  Anesthesia Plan Comments:         Anesthesia Quick Evaluation

## 2014-09-22 NOTE — Anesthesia Postprocedure Evaluation (Signed)
Anesthesia Post Note  Patient: Angela Bond  Procedure(s) Performed: Procedure(s) (LRB): RIGHT BREAST LUMPECTOMY WITH NEEDLE LOCALIZATION AND RIGHT AXILLARY SENTINEL LYMPH NODE BIOPSY (Right) INSERTION PORT-A-CATH (Left)  Anesthesia type: general  Patient location: PACU  Post pain: Pain level controlled  Post assessment: Patient's Cardiovascular Status Stable  Last Vitals:  Filed Vitals:   09/22/14 1545  BP: 129/64  Pulse: 76  Temp:   Resp: 20    Post vital signs: Reviewed and stable  Level of consciousness: sedated  Complications: No apparent anesthesia complications

## 2014-09-22 NOTE — Anesthesia Procedure Notes (Addendum)
Anesthesia Regional Block:  Pectoralis block  Pre-Anesthetic Checklist: ,, timeout performed, Correct Patient, Correct Site, Correct Laterality, Correct Procedure, Correct Position, site marked, Risks and benefits discussed,  Surgical consent,  Pre-op evaluation,  At surgeon's request and post-op pain management  Laterality: Right  Prep: chloraprep       Needles:  Injection technique: Single-shot     Needle Length: 9cm 9 cm Needle Gauge: 21 and 21 G    Additional Needles:  Procedures: ultrasound guided (picture in chart) Pectoralis block Narrative:  Start time: 09/22/2014 11:25 AM End time: 09/22/2014 11:35 AM Injection made incrementally with aspirations every 5 mL.  Performed by: Personally  Anesthesiologist: Lillia Abed  Additional Notes: Monitors applied. Patient sedated. Sterile prep and drape,hand hygiene and sterile gloves were used. Relevant anatomy identified.Needle position confirmed.Local anesthetic injected incrementally after negative aspiration. Local anesthetic spread visualized. Vascular puncture avoided. No complications. Image printed for medical record.The patient tolerated the procedure well.       Procedure Name: Intubation Date/Time: 09/22/2014 12:25 PM Performed by: Eligha Bridegroom Pre-anesthesia Checklist: Emergency Drugs available, Patient identified, Timeout performed, Suction available and Patient being monitored Patient Re-evaluated:Patient Re-evaluated prior to inductionOxygen Delivery Method: Circle system utilized Preoxygenation: Pre-oxygenation with 100% oxygen Intubation Type: IV induction Ventilation: Mask ventilation without difficulty and Oral airway inserted - appropriate to patient size Laryngoscope Size: Mac and 3 Grade View: Grade I Tube size: 7.0 mm Number of attempts: 1 Airway Equipment and Method: Stylet and LTA kit utilized Placement Confirmation: ETT inserted through vocal cords under direct vision,  breath sounds checked- equal  and bilateral and positive ETCO2 Secured at: 21 cm Tube secured with: Tape Dental Injury: Teeth and Oropharynx as per pre-operative assessment

## 2014-09-22 NOTE — H&P (Signed)
Angela Bond. Holte 09/04/2014 3:47 PM Location: [facility] Patient #: [id number] DOB: March 24, 1946 Married / Language: English / Race: White Female  History of Present Illness Angela Bond. Marlou Starks MD; 09/04/2014 4:36 PM) The patient is a 69 year old female who presents with breast cancer. We are asked to see the patient in consultation by Dr. [name] to evaluate her for a right breast cancer. The patient is a 69 year old white female who first felt a mass in the lateral aspect of her right breast about 3 months ago. She states that the mass is intermittently painful. She has had some discharge from the nipple. This mass measured 2 cm by mammogram and ultrasound. It was biopsied and came back as an invasive breast cancer. 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 She states that her sister also had breast cancer and was diagnosed at the age of 46. She is also had several cousins with breast cancer.   Other Problems Angela Bond, CMA; 09/04/2014 3:47 PM) Anxiety Disorder Arthritis Back Pain Breast Cancer Gastroesophageal Reflux Disease Hypercholesterolemia Lump In Breast  Past Surgical History Angela Bond, CMA; 09/04/2014 3:47 PM) Breast Biopsy Right. Knee Surgery Bilateral. Resection of Stomach Spinal Surgery - Lower Back  Diagnostic Studies History Angela Bond, CMA; 09/04/2014 3:47 PM) Colonoscopy 1-5 years ago Mammogram within last year Pap Smear 1-5 years ago  Allergies Angela Bond, CMA; 09/04/2014 3:50 PM) Lactose *PHARMACEUTICAL ADJUVANTS* Aleve *ANALGESICS - ANTI-INFLAMMATORY* Vomiting. Aspirin *ANALGESICS - NonNarcotic* Ciprofloxacin *CHEMICALS* Doxycycline Monohydrate *TETRACYCLINES* Evista *ENDOCRINE AND METABOLIC AGENTS - MISC.* Fosamax *ENDOCRINE AND METABOLIC AGENTS - MISC.* Pravachol *ANTIHYPERLIPIDEMICS* Sulfa 10 *OPHTHALMIC AGENTS* Welchol *ANTIHYPERLIPIDEMICS* Zocor *ANTIHYPERLIPIDEMICS* Rash.  Medication History Angela Bond, CMA; 09/04/2014  3:50 PM) Hydrocodone-Acetaminophen (5-325MG  Tablet, Oral) Active. OxyCODONE HCl (10MG  Tablet, Oral) Active. ALPRAZolam (0.25MG  Tablet, Oral) Active. Zolpidem Tartrate (5MG  Tablet, Oral) Active. FLUoxetine HCl (20MG  Capsule, Oral) Active. Lovastatin (40MG  Tablet, Oral) Active. Meloxicam (15MG  Tablet, Oral) Active. Sodium & Potassium Bicarbonate (Oral) Active. Medications Reconciled  Social History Angela Bond, [place]; 09/04/2014 3:47 PM) Caffeine use Carbonated beverages, Tea. No alcohol use No drug use Tobacco use Never smoker.  Family History Angela Bond, [place]; 09/04/2014 3:47 PM) Breast Cancer Sister. Heart Disease Father, Mother, Sister.  Pregnancy / Birth History Angela Bond, [place]; 09/04/2014 3:47 PM) Age at menarche 86 years. Age of menopause 58-50 Gravida 1 Maternal age 37-30 Para 1  Review of Systems Angela Bond CMA; 09/04/2014 3:47 PM) General Present- Appetite Loss and Weight Loss. Not Present- Chills, Fatigue, Fever, Night Sweats and Weight Gain. HEENT Present- Seasonal Allergies. Not Present- Earache, Hearing Loss, Hoarseness, Nose Bleed, Oral Ulcers, Ringing in the Ears, Sinus Pain, Sore Throat, Visual Disturbances, Wears glasses/contact lenses and Yellow Eyes. Respiratory Not Present- Bloody sputum, Chronic Cough, Difficulty Breathing, Snoring and Wheezing. Breast Present- Breast Mass. Not Present- Breast Pain, Nipple Discharge and Skin Changes. Cardiovascular Not Present- Chest Pain, Difficulty Breathing Lying Down, Leg Cramps, Palpitations, Rapid Heart Rate, Shortness of Breath and Swelling of Extremities. Gastrointestinal Present- Constipation. Not Present- Abdominal Pain, Bloating, Bloody Stool, Change in Bowel Habits, Chronic diarrhea, Difficulty Swallowing, Excessive gas, Gets full quickly at meals, Hemorrhoids, Indigestion, Nausea, Rectal Pain and Vomiting. Female Genitourinary Not Present- Frequency, Nocturia, Painful Urination, Pelvic Pain and  Urgency. Musculoskeletal Present- Joint Pain. Not Present- Back Pain, Joint Stiffness, Muscle Pain, Muscle Weakness and Swelling of Extremities. Neurological Not Present- Decreased Memory, Fainting, Headaches, Numbness, Seizures, Tingling, Tremor, Trouble walking and Weakness. Psychiatric Present- Anxiety and Depression. Not Present- Bipolar, Change in Sleep Pattern, Fearful  and Frequent crying. Endocrine Not Present- Cold Intolerance, Excessive Hunger, Hair Changes, Heat Intolerance, Hot flashes and New Diabetes. Hematology Not Present- Easy Bruising, Excessive bleeding, Gland problems, HIV and Persistent Infections.   Vitals Angela Bond CMA; 09/04/2014 3:51 PM) 09/04/2014 3:50 PM Weight: 173 lb Height: 60in Body Surface Area: 1.82 m Body Mass Index: 33.79 kg/m Temp.: 99.70F(Oral)  Pulse: 87 (Regular)  Resp.: 17 (Unlabored)  BP: 124/60 (Sitting, Left Arm, Standard)    Physical Exam [name] S. Marlou Starks MD; 09/04/2014 4:37 PM) General Mental Status-Alert. General Appearance-Consistent with stated age. Hydration-Well hydrated. Voice-Normal.  Head and Neck Head-normocephalic, atraumatic with no lesions or palpable masses. Trachea-midline. Thyroid Gland Characteristics - normal size and consistency.  Eye Eyeball - Bilateral-Extraocular movements intact. Sclera/Conjunctiva - Bilateral-No scleral icterus.  Chest and Lung Exam Chest and lung exam reveals -quiet, even and easy respiratory effort with no use of accessory muscles and on auscultation, normal breath sounds, no adventitious sounds and normal vocal resonance. Inspection Chest Wall - Normal. Back - normal.  Breast Note: There is a palpable mobile mass in the lateral aspect of the right breast measuring about 2 cm with some skin tethering. There is no palpable mass in the left breast. There is no palpable axillary, supra clavicular, or cervical lymphadenopathy.   Cardiovascular Cardiovascular  examination reveals -normal heart sounds, regular rate and rhythm with no murmurs and normal pedal pulses bilaterally.  Abdomen Inspection Inspection of the abdomen reveals - No Hernias. Skin - Scar - no surgical scars. Palpation/Percussion Palpation and Percussion of the abdomen reveal - Soft, Non Tender, No Rebound tenderness, No Rigidity (guarding) and No hepatosplenomegaly. Auscultation Auscultation of the abdomen reveals - Bowel sounds normal.  Neurologic Neurologic evaluation reveals -alert and oriented x 3 with no impairment of recent or remote memory. Mental Status-Normal.  Musculoskeletal Normal Exam - Left-Upper Extremity Strength Normal and Lower Extremity Strength Normal. Normal Exam - Right-Upper Extremity Strength Normal and Lower Extremity Strength Normal.  Lymphatic Head & Neck  General Head & Neck Lymphatics: Bilateral - Description - Normal. Axillary  General Axillary Region: Bilateral - Description - Normal. Tenderness - Non Tender. Femoral & Inguinal  Generalized Femoral & Inguinal Lymphatics: Bilateral - Description - Normal. Tenderness - Non Tender.    Assessment & Plan [name] S. Marlou Starks MD; 09/04/2014 4:15 PM) PRIMARY CANCER OF UPPER OUTER QUADRANT OF RIGHT FEMALE BREAST (174.4  C50.411) Impression: The patient appears to have a stage I cancer in the upper outer right breast. I have talked her about the different options for surgical treatment and at this point she favors breast conservation and I think this would be a reasonable way to treat her cancer. Clinically she has negative nodes so she would also be a good candidate for sentinel node mapping. Unfortunately she is also a triple negative so we will refer her to medical oncology before we operate on her to see if she needs a Port-A-Cath. I will plan to see her back in the next couple weeks to go over the recommendations of the oncologists and plan for definitive surgery. Current Plans  Follow up  in 2 weeks or as needed   Signed by Luella Cook, MD (09/04/2014 4:37 PM)

## 2014-09-22 NOTE — Progress Notes (Signed)
Pt. Reported to nurse she fell today going down stairs. States she braced herself on the right hand and her left shoulder hurts. States it feels " feels like I pulled something there." Notified Dr. Marlou Starks, new orders received.

## 2014-09-23 ENCOUNTER — Encounter (HOSPITAL_COMMUNITY): Payer: Self-pay | Admitting: General Surgery

## 2014-09-24 ENCOUNTER — Encounter: Payer: Self-pay | Admitting: General Practice

## 2014-09-24 NOTE — Progress Notes (Signed)
Spiritual Care Note  Received inbasket referral from RN/LSCW for additional emotional support, given pt's reported distress level and number of stressors.  Left voice mail for pt.  Will continue to attempt calls to reach her, but please also page as needs arise.  Thank you.  [place], [place]

## 2014-09-26 ENCOUNTER — Encounter: Payer: Self-pay | Admitting: General Practice

## 2014-09-26 NOTE — Progress Notes (Unsigned)
Spiritual Care Note  Referred by Jonelle Sports, RN for f/u support, given multiple stressors and high distress level.  On second attempt, reached Ms Tigges by phone.  She shared in detail (ca 30+ min) about her situation.  Pt describes herself as weak, nauseated, and sore from breast/port surgery this week, which followed back surgery in mid March--"And I'm not a one to just stay in this bed!"  She reports being able to prepare some healthy food and having some support from her husband and sister.  Per pt, her August 31, 2022 School class and preacher are also sources of comfort and encouragement.  Per pt, her husband has hx prostate cancer and also needs surgery for a spot on his lung, but must wait until July, when pt can drive again.  Additionally, pt's sister died of breast cancer in 08/31/03, after a 15-year remission; this grief and similar dx cause pt additional distress.  She finds comfort and healing movement (post surgery) in adult coloring.  She expressed anxiety about hair loss.  Reminded her of Look Good, Feel Better and After Breast Cancer classes to support these areas of concern.  Provided pastoral presence, reflective listening, normalization of feelings, naming of grief.  Repeated availability of chaplain and [place] team for support.  Plan to f/u by phone, but please also page as needs arise.  Thank you.  [place], [place]

## 2014-09-29 NOTE — Assessment & Plan Note (Signed)
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 Adjuvant chemo with TC X 4 (I chose his regimen because patient appears to be slightly older than her stated age and I'm concerned about toxicity to standard full dose chemotherapy with Adriamycin and Cytoxan followed by Taxol. In addition patient is also very reluctant to consider aggressive chemotherapy because she felt that her sister died because of toxicities of chemotherapy.) 2. Adjuvant XRT

## 2014-09-30 ENCOUNTER — Telehealth: Payer: Self-pay | Admitting: *Deleted

## 2014-09-30 ENCOUNTER — Telehealth: Payer: Self-pay | Admitting: Hematology and Oncology

## 2014-09-30 ENCOUNTER — Ambulatory Visit (HOSPITAL_BASED_OUTPATIENT_CLINIC_OR_DEPARTMENT_OTHER): Payer: Medicare Other | Admitting: Hematology and Oncology

## 2014-09-30 VITALS — BP 129/73 | HR 85 | Temp 98.2°F | Resp 18

## 2014-09-30 DIAGNOSIS — Z171 Estrogen receptor negative status [ER-]: Secondary | ICD-10-CM

## 2014-09-30 DIAGNOSIS — C773 Secondary and unspecified malignant neoplasm of axilla and upper limb lymph nodes: Secondary | ICD-10-CM | POA: Diagnosis not present

## 2014-09-30 DIAGNOSIS — C50411 Malignant neoplasm of upper-outer quadrant of right female breast: Secondary | ICD-10-CM | POA: Diagnosis not present

## 2014-09-30 MED ORDER — PROCHLORPERAZINE MALEATE 10 MG PO TABS: 10.0000 mg | ORAL_TABLET | Freq: Four times a day (QID) | ORAL | Status: DC | PRN

## 2014-09-30 MED ORDER — DEXAMETHASONE 4 MG PO TABS: 4.0000 mg | ORAL_TABLET | Freq: Two times a day (BID) | ORAL | Status: DC

## 2014-09-30 MED ORDER — ONDANSETRON HCL 8 MG PO TABS: 8.0000 mg | ORAL_TABLET | Freq: Two times a day (BID) | ORAL | Status: DC

## 2014-09-30 MED ORDER — LIDOCAINE-PRILOCAINE 2.5-2.5 % EX CREA: TOPICAL_CREAM | CUTANEOUS | Status: DC

## 2014-09-30 MED ORDER — LORAZEPAM 0.5 MG PO TABS: 0.5000 mg | ORAL_TABLET | Freq: Every day | ORAL | Status: DC

## 2014-09-30 NOTE — Telephone Encounter (Signed)
Per staff message and POF I have scheduled appts. Advised scheduler of appts. JMW  

## 2014-09-30 NOTE — Progress Notes (Signed)
Patient Care Team: Gaynelle Arabian, MD as PCP - General (Family Medicine) Jerline Pain, MD as Consulting Physician (Cardiology) Wonda Horner, MD as Consulting Physician (Gastroenterology) Ralene Ok, MD as Consulting Physician (Surgery)  DIAGNOSIS: No matching staging information was found for the patient.  SUMMARY OF ONCOLOGIC HISTORY:   Breast cancer of upper-outer quadrant of right female breast   07/30/2014 Mammogram Right breast 10 o'clock location 4 cm from the nipple measuring 1.7 x 1.3 x 1.3 cm. This corresponds to the palpable and mammographic finding. In the right axilla, there are several lymph nodes which are normal in size measuring up to 50mm.   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 She is accompanied by her husband. She had done fairly well from surgery and is slightly sore underneath the arm area. she has a spinal stimulator. She had a port placement during surgery. She has difficulty with sleeping and does not want to be addicted to any sedatives.  REVIEW OF SYSTEMS:   Constitutional: Denies fevers, chills or abnormal weight loss Eyes: Denies blurriness of vision Ears, nose, mouth, throat, and face: Denies mucositis or sore throat Respiratory: Denies cough, dyspnea or wheezes Cardiovascular: Denies palpitation, chest discomfort or lower extremity swelling Gastrointestinal:  Denies nausea, heartburn or change in bowel habits Skin: Denies abnormal skin rashes Lymphatics: Denies new lymphadenopathy or easy  bruising Neurological:Denies numbness, tingling or new weaknesses Behavioral/Psych: Mood is stable, no new changes  Breast: Slight soreness in the right breast from recent surgery, port site appears to be healing very well All other systems were reviewed with the patient and are negative.  I have reviewed the past medical history, past surgical history, social history and family history with the patient and they are unchanged from previous note.  ALLERGIES:  is allergic to shellfish allergy; lactose intolerance (gi); aleve; aspirin; ciprofloxacin; doxycycline; evista; fosamax; pravachol; sulfa antibiotics; welchol; and zocor.  MEDICATIONS:  Current Outpatient Prescriptions  Medication Sig Dispense Refill  . ALPRAZolam (XANAX) 0.25 MG tablet Take 0.25 mg by mouth at bedtime as needed for sleep or anxiety.     . calcium carbonate (OS-CAL) 600 MG TABS Take 600 mg by mouth 2 (two) times daily with a meal.    . carboxymethylcellulose (REFRESH PLUS) 0.5 % SOLN 1 drop 4 (four) times daily.     . Cholecalciferol (VITAMIN D) 2000 UNITS tablet Take 2,000 Units by mouth 2 (two) times daily.     Marland Kitchen dexlansoprazole (DEXILANT) 60 MG capsule Take 60 mg by mouth daily.    . diphenhydrAMINE (BENADRYL) 25 mg capsule Take 25 mg by mouth at bedtime as needed for sleep.    . fexofenadine (ALLEGRA) 180 MG tablet Take 180 mg by mouth daily.    Marland Kitchen FLUoxetine (PROZAC) 20 MG capsule Take 20 mg by mouth daily.  11  . gabapentin (NEURONTIN) 300 MG capsule Take 300 mg by mouth at bedtime.     . lovastatin (MEVACOR) 40 MG tablet Take 40 mg by mouth daily before breakfast.     . meclizine (  ANTIVERT) 25 MG tablet Take 12.5-25 mg by mouth 3 (three) times daily as needed for dizziness.    . meloxicam (MOBIC) 15 MG tablet Take 15 mg by mouth daily.     Marland Kitchen oxyCODONE-acetaminophen (ROXICET) 5-325 MG per tablet Take 1-2 tablets by mouth every 4 (four) hours as needed. 50 tablet 0  . oxyCODONE-acetaminophen (ROXICET) 5-325 MG per  tablet Take 1-2 tablets by mouth every 4 (four) hours as needed. 50 tablet 0  . polyethylene glycol (MIRALAX / GLYCOLAX) packet Take 17 g by mouth as needed.    . sodium chloride (OCEAN) 0.65 % SOLN nasal spray Place 2 sprays into both nostrils as needed for congestion.     No current facility-administered medications for this visit.    PHYSICAL EXAMINATION: ECOG PERFORMANCE STATUS: 1 - Symptomatic but completely ambulatory  Filed Vitals:   09/30/14 1117  BP: 129/73  Pulse: 85  Temp: 98.2 F (36.8 C)  Resp: 18   Filed Weights    GENERAL:alert, no distress and comfortable SKIN: skin color, texture, turgor are normal, no rashes or significant lesions EYES: normal, Conjunctiva are pink and non-injected, sclera clear OROPHARYNX:no exudate, no erythema and lips, buccal mucosa, and tongue normal  NECK: supple, thyroid normal size, non-tender, without nodularity LYMPH:  no palpable lymphadenopathy in the cervical, axillary or inguinal LUNGS: clear to auscultation and percussion with normal breathing effort HEART: regular rate & rhythm and no murmurs and no lower extremity edema ABDOMEN:abdomen soft, non-tender and normal bowel sounds Musculoskeletal:no cyanosis of digits and no clubbing  NEURO: alert & oriented x 3 with fluent speech, no focal motor/sensory deficits  LABORATORY DATA:  I have reviewed the data as listed   Chemistry      Component Value Date/Time   NA 138 09/18/2014 0858   K 3.5 09/18/2014 0858   CL 100* 09/18/2014 0858   CO2 26 09/18/2014 0858   BUN 12 09/18/2014 0858   CREATININE 0.86 09/18/2014 0858      Component Value Date/Time   CALCIUM 9.2 09/18/2014 0858   ALKPHOS 81 09/20/2013 1426   AST 16 09/20/2013 1426   ALT 14 09/20/2013 1426   BILITOT 0.5 09/20/2013 1426       Lab Results  Component Value Date   WBC 7.6 09/18/2014   HGB 11.1* 09/18/2014   HCT 35.2* 09/18/2014   MCV 84.8 09/18/2014   PLT 227 09/18/2014   NEUTROABS 7.6 01/14/2012     ASSESSMENT & PLAN:  Breast cancer of upper-outer quadrant of right female breast Rt Lumpectomy: IDC grade 3, 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 Adjuvant chemo with TC X 4 (I chose his regimen because patient appears to be slightly older than her stated age and I'm concerned about toxicity to standard full dose chemotherapy with Adriamycin and Cytoxan followed by Taxol. In addition patient is also very reluctant to consider aggressive chemotherapy because she felt that her sister died because of toxicities of chemotherapy.) 2. Adjuvant XRT  Nausea after surgery: She was prescribed Zofran ODT which has helped her. She is eating better and her strength is improving. She reports that she is in a much better frame of mind. She left it to God to help her.  No orders of the defined types were placed in this encounter.   The patient has a good understanding of the overall plan. she agrees with it. she will call  with any problems that may develop before the next visit here.   Rulon Eisenmenger, MD

## 2014-09-30 NOTE — Telephone Encounter (Signed)
Gave avs & calendar for May. Sent message to schedule treatment. °

## 2014-10-01 ENCOUNTER — Encounter: Payer: Self-pay | Admitting: General Practice

## 2014-10-01 ENCOUNTER — Other Ambulatory Visit: Payer: Self-pay

## 2014-10-01 ENCOUNTER — Telehealth: Payer: Self-pay | Admitting: Hematology and Oncology

## 2014-10-01 ENCOUNTER — Other Ambulatory Visit: Payer: Medicare Other

## 2014-10-01 NOTE — Telephone Encounter (Signed)
Called and left a message with chemo nadir check

## 2014-10-01 NOTE — Progress Notes (Signed)
Spiritual Care Note  Followed up with Angela Bond in chemo class, providing further emotional support and information about Support Center team/resources.  Provided space for her to share and process her feelings about the multiple stressors of healing from back and breast surgery while facing chemo.  She was intermittently resolved, reflective, and tearful, claiming for herself the "normalness" of such mixed feelings in times of stress and transition.  She also appeared to find meaning in connecting with others in chemo class, particularly as they normalized these varied feelings for one another.  Provided emotional support, encouragement, and affirmation.    Following, but please also page as needs arise.  Thank you.  [place], [place]

## 2014-10-08 ENCOUNTER — Encounter: Payer: Self-pay | Admitting: General Practice

## 2014-10-08 NOTE — Progress Notes (Signed)
Spiritual Care Note  Followed up with Ms Orvan Seen by phone today.  Despite a fall Monday (and swollen ankle, painful back of knee), she was in good spirits today.  Per pt, she no longer has to wear her post-op back brace, and she is getting out for shopping and other ADLs.  Per pt, she has already signed up for "Look Good, Feel Better"; registered her for horticultural therapy event "Butterflies, Bees, and Hummingbirds" per her request.  She reports good support from friends and is excited to attend church Sunday for a Memorial Day event, planning rest on Monday before appointments Tuesday and first chemo Wednesday.  Plan to f/u in infusion room, but please also page if needs arise.  Thank you.  [place], [place]

## 2014-10-14 ENCOUNTER — Ambulatory Visit (HOSPITAL_BASED_OUTPATIENT_CLINIC_OR_DEPARTMENT_OTHER): Payer: Medicare Other | Admitting: Hematology and Oncology

## 2014-10-14 ENCOUNTER — Telehealth: Payer: Self-pay | Admitting: Hematology and Oncology

## 2014-10-14 ENCOUNTER — Other Ambulatory Visit (HOSPITAL_BASED_OUTPATIENT_CLINIC_OR_DEPARTMENT_OTHER): Payer: Medicare Other

## 2014-10-14 ENCOUNTER — Ambulatory Visit (HOSPITAL_BASED_OUTPATIENT_CLINIC_OR_DEPARTMENT_OTHER): Payer: Medicare Other

## 2014-10-14 DIAGNOSIS — Z171 Estrogen receptor negative status [ER-]: Secondary | ICD-10-CM

## 2014-10-14 DIAGNOSIS — Z95828 Presence of other vascular implants and grafts: Secondary | ICD-10-CM

## 2014-10-14 DIAGNOSIS — C50411 Malignant neoplasm of upper-outer quadrant of right female breast: Secondary | ICD-10-CM | POA: Diagnosis not present

## 2014-10-14 DIAGNOSIS — Z452 Encounter for adjustment and management of vascular access device: Secondary | ICD-10-CM | POA: Diagnosis not present

## 2014-10-14 DIAGNOSIS — D649 Anemia, unspecified: Secondary | ICD-10-CM | POA: Diagnosis not present

## 2014-10-14 DIAGNOSIS — C773 Secondary and unspecified malignant neoplasm of axilla and upper limb lymph nodes: Secondary | ICD-10-CM | POA: Diagnosis not present

## 2014-10-14 LAB — COMPREHENSIVE METABOLIC PANEL (CC13)
ALK PHOS: 75 U/L (ref 40–150)
ALT: 8 U/L (ref 0–55)
AST: 15 U/L (ref 5–34)
Albumin: 3.2 g/dL — ABNORMAL LOW (ref 3.5–5.0)
Anion Gap: 11 mEq/L (ref 3–11)
BILIRUBIN TOTAL: 0.46 mg/dL (ref 0.20–1.20)
BUN: 9.1 mg/dL (ref 7.0–26.0)
CHLORIDE: 104 meq/L (ref 98–109)
CO2: 27 mEq/L (ref 22–29)
Calcium: 8.5 mg/dL (ref 8.4–10.4)
Creatinine: 0.8 mg/dL (ref 0.6–1.1)
EGFR: 80 mL/min/{1.73_m2} — AB (ref >90)
Glucose: 93 mg/dl (ref 70–140)
Potassium: 3.7 mEq/L (ref 3.5–5.1)
Sodium: 142 mEq/L (ref 136–145)
TOTAL PROTEIN: 6.8 g/dL (ref 6.4–8.3)

## 2014-10-14 LAB — CBC WITH DIFFERENTIAL/PLATELET
BASO%: 0.9 % (ref 0.0–2.0)
Basophils Absolute: 0.1 10*3/uL (ref 0.0–0.1)
EOS%: 2.2 % (ref 0.0–7.0)
Eosinophils Absolute: 0.1 10*3/uL (ref 0.0–0.5)
HCT: 30.2 % — ABNORMAL LOW (ref 34.8–46.6)
HGB: 10 g/dL — ABNORMAL LOW (ref 11.6–15.9)
LYMPH%: 23.7 % (ref 14.0–49.7)
MCH: 26.5 pg (ref 25.1–34.0)
MCHC: 33.2 g/dL (ref 31.5–36.0)
MCV: 79.8 fL (ref 79.5–101.0)
MONO#: 0.5 10*3/uL (ref 0.1–0.9)
MONO%: 8.1 % (ref 0.0–14.0)
NEUT%: 65.1 % (ref 38.4–76.8)
NEUTROS ABS: 4.2 10*3/uL (ref 1.5–6.5)
PLATELETS: 174 10*3/uL (ref 145–400)
RBC: 3.78 10*6/uL (ref 3.70–5.45)
RDW: 14.3 % (ref 11.2–14.5)
WBC: 6.5 10*3/uL (ref 3.9–10.3)
lymph#: 1.5 10*3/uL (ref 0.9–3.3)

## 2014-10-14 MED ORDER — SODIUM CHLORIDE 0.9 % IJ SOLN
10.0000 mL | INTRAMUSCULAR | Status: DC | PRN
Start: 2014-10-14 — End: 2014-10-14
  Administered 2014-10-14: 10 mL via INTRAVENOUS
  Filled 2014-10-14: qty 10

## 2014-10-14 MED ORDER — HEPARIN SOD (PORK) LOCK FLUSH 100 UNIT/ML IV SOLN
500.0000 [IU] | Freq: Once | INTRAVENOUS | Status: AC
  Administered 2014-10-14: 500 [IU] via INTRAVENOUS
  Filled 2014-10-14: qty 5

## 2014-10-14 NOTE — Patient Instructions (Signed)

## 2014-10-14 NOTE — Telephone Encounter (Signed)
Appointments made and avs printed for patient °

## 2014-10-14 NOTE — Progress Notes (Signed)
Patient Care Team: Gaynelle Arabian, MD as PCP - General (Family Medicine) Jerline Pain, MD as Consulting Physician (Cardiology) Wonda Horner, MD as Consulting Physician (Gastroenterology) Ralene Ok, MD as Consulting Physician (Surgery)  DIAGNOSIS: No matching staging information was found for the patient.  SUMMARY OF ONCOLOGIC HISTORY:   Breast cancer of upper-outer quadrant of right female breast   07/30/2014 Mammogram Right breast 10 o'clock location 4 cm from the nipple measuring 1.7 x 1.3 x 1.3 cm. This corresponds to the palpable and mammographic finding. In the right axilla, there are several lymph nodes which are normal in size measuring up to 58mm.   08/27/2014 Initial Diagnosis Right Breast 10:00: IDC with LVI, Grade 3, Er 0%, PR 0%, Her 2 Neg, Ki 67: 30%   09/22/2014 Surgery Rt Lumpectomy: IDC grade 3, Size 2.6 cm, with HG DCIS, LVI present, ER/PR: 0%; 1/2 LN Positive: T2N1M0 Stage 2B    CHIEF COMPLIANT:  Patient to start adjuvant chemotherapy 10/15/2014  INTERVAL HISTORY: Angela Bond is a  69 year old with above-mentioned history of right breast cancer underwent lumpectomy and is going to start adjuvant chemotherapy on 10/15/2014. She underwent a port placement and is doing very well from it and has been left accessed. She had chemotherapy education and also obtain all of her medications and she started taking Decadron today. She reports that she is in excellent spirits. She wants to stay positive and found talking to another breast cancer survivor very useful.  REVIEW OF SYSTEMS:   Constitutional: Denies fevers, chills or abnormal weight loss Eyes: Denies blurriness of vision Ears, nose, mouth, throat, and face: Denies mucositis or sore throat Respiratory: Denies cough, dyspnea or wheezes Cardiovascular: Denies palpitation, chest discomfort or lower extremity swelling Gastrointestinal:  Denies nausea, heartburn or change in bowel habits Skin: Denies abnormal skin  rashes Lymphatics: Denies new lymphadenopathy or easy bruising Neurological:Denies numbness, tingling or new weaknesses Behavioral/Psych: Mood is stable, no new changes  Breast:  Healed well from recent surgery All other systems were reviewed with the patient and are negative.  I have reviewed the past medical history, past surgical history, social history and family history with the patient and they are unchanged from previous note.  ALLERGIES:  is allergic to shellfish allergy; lactose intolerance (gi); aleve; aspirin; ciprofloxacin; doxycycline; evista; fosamax; pravachol; sulfa antibiotics; welchol; and zocor.  MEDICATIONS:  Current Outpatient Prescriptions  Medication Sig Dispense Refill  . ALPRAZolam (XANAX) 0.25 MG tablet Take 0.25 mg by mouth at bedtime as needed for sleep or anxiety.     . calcium carbonate (OS-CAL) 600 MG TABS Take 600 mg by mouth 2 (two) times daily with a meal.    . carboxymethylcellulose (REFRESH PLUS) 0.5 % SOLN 1 drop 4 (four) times daily.     . Cholecalciferol (VITAMIN D) 2000 UNITS tablet Take 2,000 Units by mouth 2 (two) times daily.     Marland Kitchen dexamethasone (DECADRON) 4 MG tablet Take 1 tablet (4 mg total) by mouth 2 (two) times daily. Start the day before Taxotere. Then again the day after chemo for 3 days. 30 tablet 1  . dexlansoprazole (DEXILANT) 60 MG capsule Take 60 mg by mouth daily.    . diphenhydrAMINE (BENADRYL) 25 mg capsule Take 25 mg by mouth at bedtime as needed for sleep.    . fexofenadine (ALLEGRA) 180 MG tablet Take 180 mg by mouth daily.    Marland Kitchen FLUoxetine (PROZAC) 20 MG capsule Take 20 mg by mouth daily.  11  . gabapentin (  NEURONTIN) 300 MG capsule Take 300 mg by mouth at bedtime.     . lidocaine-prilocaine (EMLA) cream Apply to affected area once 30 g 3  . LORazepam (ATIVAN) 0.5 MG tablet Take 1 tablet (0.5 mg total) by mouth at bedtime. 30 tablet 0  . lovastatin (MEVACOR) 40 MG tablet Take 40 mg by mouth daily before breakfast.     .  meclizine (ANTIVERT) 25 MG tablet Take 12.5-25 mg by mouth 3 (three) times daily as needed for dizziness.    . meloxicam (MOBIC) 15 MG tablet Take 15 mg by mouth daily.     . ondansetron (ZOFRAN) 8 MG tablet Take 1 tablet (8 mg total) by mouth 2 (two) times daily. Start the day after chemo for 3 days. Then take as needed for nausea or vomiting. 30 tablet 1  . ondansetron (ZOFRAN-ODT) 4 MG disintegrating tablet     . Oxycodone HCl 10 MG TABS   0  . oxyCODONE-acetaminophen (ROXICET) 5-325 MG per tablet Take 1-2 tablets by mouth every 4 (four) hours as needed. 50 tablet 0  . polyethylene glycol (MIRALAX / GLYCOLAX) packet Take 17 g by mouth as needed.    . prochlorperazine (COMPAZINE) 10 MG tablet Take 1 tablet (10 mg total) by mouth every 6 (six) hours as needed (Nausea or vomiting). 30 tablet 1  . sodium chloride (OCEAN) 0.65 % SOLN nasal spray Place 2 sprays into both nostrils as needed for congestion.    Marland Kitchen zolpidem (AMBIEN) 5 MG tablet   0   No current facility-administered medications for this visit.    PHYSICAL EXAMINATION: ECOG PERFORMANCE STATUS: 1 - Symptomatic but completely ambulatory  Filed Vitals:   10/14/14 0914  BP: 155/76  Pulse: 84  Temp: 98.2 F (36.8 C)  Resp: 18   Filed Weights   10/14/14 0914  Weight: 173 lb 12.8 oz (78.835 kg)    GENERAL:alert, no distress and comfortable SKIN: skin color, texture, turgor are normal, no rashes or significant lesions EYES: normal, Conjunctiva are pink and non-injected, sclera clear OROPHARYNX:no exudate, no erythema and lips, buccal mucosa, and tongue normal  NECK: supple, thyroid normal size, non-tender, without nodularity LYMPH:  no palpable lymphadenopathy in the cervical, axillary or inguinal LUNGS: clear to auscultation and percussion with normal breathing effort HEART: regular rate & rhythm and no murmurs and no lower extremity edema ABDOMEN:abdomen soft, non-tender and normal bowel sounds Musculoskeletal:no cyanosis of  digits and no clubbing  NEURO: alert & oriented x 3 with fluent speech, no focal motor/sensory deficits  LABORATORY DATA:  I have reviewed the data as listed   Chemistry      Component Value Date/Time   NA 142 10/14/2014 0806   NA 138 09/18/2014 0858   K 3.7 10/14/2014 0806   K 3.5 09/18/2014 0858   CL 100* 09/18/2014 0858   CO2 27 10/14/2014 0806   CO2 26 09/18/2014 0858   BUN 9.1 10/14/2014 0806   BUN 12 09/18/2014 0858   CREATININE 0.8 10/14/2014 0806   CREATININE 0.86 09/18/2014 0858      Component Value Date/Time   CALCIUM 8.5 10/14/2014 0806   CALCIUM 9.2 09/18/2014 0858   ALKPHOS 75 10/14/2014 0806   ALKPHOS 81 09/20/2013 1426   AST 15 10/14/2014 0806   AST 16 09/20/2013 1426   ALT 8 10/14/2014 0806   ALT 14 09/20/2013 1426   BILITOT 0.46 10/14/2014 0806   BILITOT 0.5 09/20/2013 1426       Lab Results  Component  Value Date   WBC 6.5 10/14/2014   HGB 10.0* 10/14/2014   HCT 30.2* 10/14/2014   MCV 79.8 10/14/2014   PLT 174 10/14/2014   NEUTROABS 4.2 10/14/2014   ASSESSMENT & PLAN:  Breast cancer of upper-outer quadrant of right female breast Rt Lumpectomy: IDC grade 3, 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 Adjuvant chemo with TC X 4 (I chose his regimen because patient appears to be slightly older than her stated age and I'm concerned about toxicity to standard full dose chemotherapy with Adriamycin and Cytoxan followed by Taxol. In addition patient is also very reluctant to consider aggressive chemotherapy because she felt that her sister died because of toxicities of chemotherapy.) 2. Adjuvant XRT  Current treatment: Cycle 1 day 1  10/15/2014 of Taxotere Cytoxan  patient signed chemotherapy consent, port has been placed, chemotherapy educational been completed, patient understands ramp time and it regimen in detail.  Anemia: slightly and a microcytic side. We  will check iron studies with the next lab work. I instructed her to return containing foods.  within the chemotherapy orders see liter normal  Return to clinic 1 week after starting chemotherapy for toxicity check.    Orders Placed This Encounter  Procedures  . Iron and TIBC [facility]    Standing Status: Future     Number of Occurrences:      Standing Expiration Date: 10/14/2015  . Ferritin    Standing Status: Future     Number of Occurrences:      Standing Expiration Date: 10/14/2015  . CBC with Differential    Standing Status: Future     Number of Occurrences:      Standing Expiration Date: 10/14/2015   The patient has a good understanding of the overall plan. she agrees with it. she will call with any problems that may develop before the next visit here.   Rulon Eisenmenger, MD

## 2014-10-14 NOTE — Assessment & Plan Note (Addendum)
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 Adjuvant chemo with TC X 4 (I chose his regimen because patient appears to be slightly older than her stated age and I'm concerned about toxicity to standard full dose chemotherapy with Adriamycin and Cytoxan followed by Taxol. In addition patient is also very reluctant to consider aggressive chemotherapy because she felt that her sister died because of toxicities of chemotherapy.) 2. Adjuvant XRT  Current treatment: Cycle 1 day 1  10/15/2014 of Taxotere Cytoxan  patient signed chemotherapy consent, port has been placed, chemotherapy educational been completed, patient understands ramp time and it regimen in detail.  Anemia: slightly and a microcytic side. We will check iron studies with the next lab work. I instructed her to return containing foods.  within the chemotherapy orders see liter normal  Return to clinic 1 week after starting chemotherapy for toxicity check.

## 2014-10-15 ENCOUNTER — Ambulatory Visit: Payer: Medicare Other

## 2014-10-15 ENCOUNTER — Other Ambulatory Visit: Payer: Self-pay | Admitting: *Deleted

## 2014-10-15 ENCOUNTER — Ambulatory Visit (HOSPITAL_BASED_OUTPATIENT_CLINIC_OR_DEPARTMENT_OTHER): Payer: Medicare Other | Admitting: Nurse Practitioner

## 2014-10-15 ENCOUNTER — Encounter: Payer: Self-pay | Admitting: General Practice

## 2014-10-15 ENCOUNTER — Ambulatory Visit (HOSPITAL_COMMUNITY)
Admission: RE | Admit: 2014-10-15 | Discharge: 2014-10-15 | Disposition: A | Discharge status: Home / Self Care | Payer: Medicare Other | Source: Ambulatory Visit | Attending: Hematology and Oncology | Admitting: Hematology and Oncology

## 2014-10-15 ENCOUNTER — Ambulatory Visit (HOSPITAL_BASED_OUTPATIENT_CLINIC_OR_DEPARTMENT_OTHER): Payer: Medicare Other

## 2014-10-15 VITALS — BP 128/71 | HR 59 | Temp 99.0°F | Resp 18

## 2014-10-15 DIAGNOSIS — Z5189 Encounter for other specified aftercare: Secondary | ICD-10-CM | POA: Diagnosis not present

## 2014-10-15 DIAGNOSIS — W19XXXA Unspecified fall, initial encounter: Secondary | ICD-10-CM | POA: Diagnosis not present

## 2014-10-15 DIAGNOSIS — M25571 Pain in right ankle and joints of right foot: Secondary | ICD-10-CM | POA: Diagnosis not present

## 2014-10-15 DIAGNOSIS — C50411 Malignant neoplasm of upper-outer quadrant of right female breast: Secondary | ICD-10-CM | POA: Diagnosis not present

## 2014-10-15 DIAGNOSIS — Y9301 Activity, walking, marching and hiking: Secondary | ICD-10-CM | POA: Insufficient documentation

## 2014-10-15 DIAGNOSIS — Z5111 Encounter for antineoplastic chemotherapy: Secondary | ICD-10-CM

## 2014-10-15 MED ORDER — DEXAMETHASONE SODIUM PHOSPHATE 100 MG/10ML IJ SOLN
Freq: Once | INTRAMUSCULAR | Status: AC
  Administered 2014-10-15: 09:00:00 via INTRAVENOUS
  Filled 2014-10-15: qty 1

## 2014-10-15 MED ORDER — OXYCODONE-ACETAMINOPHEN 5-325 MG PO TABS
ORAL_TABLET | ORAL | Status: AC
  Filled 2014-10-15: qty 1

## 2014-10-15 MED ORDER — PALONOSETRON HCL INJECTION 0.25 MG/5ML
0.2500 mg | Freq: Once | INTRAVENOUS | Status: AC
  Administered 2014-10-15: 0.25 mg via INTRAVENOUS

## 2014-10-15 MED ORDER — SODIUM CHLORIDE 0.9 % IV SOLN
Freq: Once | INTRAVENOUS | Status: AC
  Administered 2014-10-15: 09:00:00 via INTRAVENOUS

## 2014-10-15 MED ORDER — OXYCODONE-ACETAMINOPHEN 5-325 MG PO TABS: 1.0000 | ORAL_TABLET | ORAL | Status: DC | PRN

## 2014-10-15 MED ORDER — SODIUM CHLORIDE 0.9 % IV SOLN
600.0000 mg/m2 | Freq: Once | INTRAVENOUS | Status: AC
  Administered 2014-10-15: 1080 mg via INTRAVENOUS
  Filled 2014-10-15: qty 54

## 2014-10-15 MED ORDER — SODIUM CHLORIDE 0.9 % IJ SOLN
10.0000 mL | INTRAMUSCULAR | Status: DC | PRN
  Administered 2014-10-15: 10 mL
  Filled 2014-10-15: qty 10

## 2014-10-15 MED ORDER — PALONOSETRON HCL INJECTION 0.25 MG/5ML
INTRAVENOUS | Status: AC
Start: 2014-10-15 — End: 2014-10-15
  Filled 2014-10-15: qty 5

## 2014-10-15 MED ORDER — HEPARIN SOD (PORK) LOCK FLUSH 100 UNIT/ML IV SOLN
500.0000 [IU] | Freq: Once | INTRAVENOUS | Status: AC | PRN
  Administered 2014-10-15: 500 [IU]
  Filled 2014-10-15: qty 5

## 2014-10-15 MED ORDER — DEXTROSE 5 % IV SOLN
75.0000 mg/m2 | Freq: Once | INTRAVENOUS | Status: AC
  Administered 2014-10-15: 140 mg via INTRAVENOUS
  Filled 2014-10-15: qty 14

## 2014-10-15 MED ORDER — PEGFILGRASTIM 6 MG/0.6ML ~~LOC~~ PSKT
6.0000 mg | PREFILLED_SYRINGE | Freq: Once | SUBCUTANEOUS | Status: AC
  Administered 2014-10-15: 6 mg via SUBCUTANEOUS
  Filled 2014-10-15: qty 0.6

## 2014-10-15 MED ORDER — OXYCODONE-ACETAMINOPHEN 5-325 MG PO TABS
1.0000 | ORAL_TABLET | Freq: Once | ORAL | Status: AC
  Administered 2014-10-15: 1 via ORAL

## 2014-10-15 NOTE — Progress Notes (Signed)
Per Diane, Dr. Geralyn Flash RN, okay to proceed with treatment today. States she will place orders for xrays to the right knee and ankle for patient to have done after treatment.  Pt verbalized understanding.   1115: Pt reports worsening pain to right knee, bruising and swelling noted.  Selena Lesser, NP notified and will come assess patient.   1215: Pt discharged in no apparent distress. Tolerated treatment well.  Tresa Moore, NT assisted patient via wheelchair to radiology department for xrays accompanied by Husband. Selena Lesser, NP to call patient regarding results of xray and follow-up. Pt verbalized understanding.

## 2014-10-15 NOTE — Progress Notes (Signed)
Spiritual Care Note  Met with Angela Bond in infusion room as planned, providing spiritual and emotional support.  Created space for her to share and process her coping tools:  Meditative coloring, faith, prayer and devotional reading, listening to spiritual music, receiving encouragement from family and friends, practicing gratitude, processing and releasing her anger re dx, confidence in her tx team, making simple plans for enjoyment (visit to a butterfly house), etc.  Despite another fall today, she was in good spirits.  Brought her a prayer card that I made at the healing arts table, which she tucked gratefully into her devotional book.  She plans to schedule another visit, but please also page as needs arise.  Thank you.  [place], [place]

## 2014-10-15 NOTE — Progress Notes (Signed)
This RN escorted patient into infusion area from lobby, pt using cane.  Pt states "my foot got stuck" and patient had a fall.  This RN helped assist patient to floor, but patient landed on right knee.  Pt reports pain to right knee, area appears bruised and slightly swollen.  Pt reports she had a fall on 5/23 causing pain to right ankle and knee.  States she did not seek medical attention after her fall and did not discuss this during her MD visit with Dr. Lindi Adie on 5/31.  Vital signs stable.  Able to assist patient to chair.  Diane, Dr. Geralyn Flash RN, notified and will make Dr. Lindi Adie aware.

## 2014-10-15 NOTE — Patient Instructions (Addendum)
[facility] Discharge Instructions for Patients Receiving Chemotherapy  Today you received the following chemotherapy agents: Taxotere and Cytoxan.   To help prevent nausea and vomiting after your treatment, we encourage you to take your nausea medication as directed: Compazine 10 mg every six hours as needed. Do not take Zofran for three day after treatment.     If you develop nausea and vomiting that is not controlled by your nausea medication, call the clinic.   BELOW ARE SYMPTOMS THAT SHOULD BE REPORTED IMMEDIATELY:  *FEVER GREATER THAN 100.5 F  *CHILLS WITH OR WITHOUT FEVER  NAUSEA AND VOMITING THAT IS NOT CONTROLLED WITH YOUR NAUSEA MEDICATION  *UNUSUAL SHORTNESS OF BREATH  *UNUSUAL BRUISING OR BLEEDING  TENDERNESS IN MOUTH AND THROAT WITH OR WITHOUT PRESENCE OF ULCERS  *URINARY PROBLEMS  *BOWEL PROBLEMS  UNUSUAL RASH Items with * indicate a potential emergency and should be followed up as soon as possible.  Feel free to call the clinic you have any questions or concerns. The clinic phone number is [phone].  Please show the [facility] at check-in to the Emergency Department and triage nurse.   Docetaxel injection What is this medicine? DOCETAXEL (doe se TAX el) is a chemotherapy drug. It targets fast dividing cells, like cancer cells, and causes these cells to die. This medicine is used to treat many types of cancers like breast cancer, certain stomach cancers, head and neck cancer, lung cancer, and prostate cancer. This medicine may be used for other purposes; ask your health care provider or pharmacist if you have questions. COMMON BRAND NAME(S): Docefrez, Taxotere What should I tell my health care provider before I take this medicine? They need to know if you have any of these conditions: -infection (especially a virus infection such as chickenpox, cold sores, or herpes) -liver disease -low blood counts, like low white cell, platelet,  or red cell counts -an unusual or allergic reaction to docetaxel, polysorbate 80, other chemotherapy agents, other medicines, foods, dyes, or preservatives -pregnant or trying to get pregnant -breast-feeding How should I use this medicine? This drug is given as an infusion into a vein. It is administered in a hospital or clinic by a specially trained health care professional. Talk to your pediatrician regarding the use of this medicine in children. Special care may be needed. Overdosage: If you think you have taken too much of this medicine contact a poison control center or emergency room at once. NOTE: This medicine is only for you. Do not share this medicine with others. What if I miss a dose? It is important not to miss your dose. Call your doctor or health care professional if you are unable to keep an appointment. What may interact with this medicine? -cyclosporine -erythromycin -ketoconazole -medicines to increase blood counts like filgrastim, pegfilgrastim, sargramostim -vaccines Talk to your doctor or health care professional before taking any of these medicines: -acetaminophen -aspirin -ibuprofen -ketoprofen -naproxen This list may not describe all possible interactions. Give your health care provider a list of all the medicines, herbs, non-prescription drugs, or dietary supplements you use. Also tell them if you smoke, drink alcohol, or use illegal drugs. Some items may interact with your medicine. What should I watch for while using this medicine? Your condition will be monitored carefully while you are receiving this medicine. You will need important blood work done while you are taking this medicine. This drug may make you feel generally unwell. This is not uncommon, as chemotherapy can affect  healthy cells as well as cancer cells. Report any side effects. Continue your course of treatment even though you feel ill unless your doctor tells you to stop. In some cases, you may  be given additional medicines to help with side effects. Follow all directions for their use. Call your doctor or health care professional for advice if you get a fever, chills or sore throat, or other symptoms of a cold or flu. Do not treat yourself. This drug decreases your body's ability to fight infections. Try to avoid being around people who are sick. This medicine may increase your risk to bruise or bleed. Call your doctor or health care professional if you notice any unusual bleeding. Be careful brushing and flossing your teeth or using a toothpick because you may get an infection or bleed more easily. If you have any dental work done, tell your dentist you are receiving this medicine. Avoid taking products that contain aspirin, acetaminophen, ibuprofen, naproxen, or ketoprofen unless instructed by your doctor. These medicines may hide a fever. This medicine contains an alcohol in the product. You may get drowsy or dizzy. Do not drive, use machinery, or do anything that needs mental alertness until you know how this medicine affects you. Do not stand or sit up quickly, especially if you are an older patient. This reduces the risk of dizzy or fainting spells. Avoid alcoholic drinks Do not become pregnant while taking this medicine. Women should inform their doctor if they wish to become pregnant or think they might be pregnant. There is a potential for serious side effects to an unborn child. Talk to your health care professional or pharmacist for more information. Do not breast-feed an infant while taking this medicine. What side effects may I notice from receiving this medicine? Side effects that you should report to your doctor or health care professional as soon as possible: -allergic reactions like skin rash, itching or hives, swelling of the face, lips, or tongue -low blood counts - This drug may decrease the number of white blood cells, red blood cells and platelets. You may be at increased  risk for infections and bleeding. -signs of infection - fever or chills, cough, sore throat, pain or difficulty passing urine -signs of decreased platelets or bleeding - bruising, pinpoint red spots on the skin, black, tarry stools, nosebleeds -signs of decreased red blood cells - unusually weak or tired, fainting spells, lightheadedness -breathing problems -fast or irregular heartbeat -low blood pressure -mouth sores -nausea and vomiting -pain, swelling, redness or irritation at the injection site -pain, tingling, numbness in the hands or feet -swelling of the ankle, feet, hands -weight gain Side effects that usually do not require medical attention (report to your prescriber or health care professional if they continue or are bothersome): -bone pain -complete hair loss including hair on your head, underarms, pubic hair, eyebrows, and eyelashes -diarrhea -excessive tearing -changes in the color of fingernails -loosening of the fingernails -nausea -muscle pain -red flush to skin -sweating -weak or tired This list may not describe all possible side effects. Call your doctor for medical advice about side effects. You may report side effects to FDA at 1-800-FDA-1088. Where should I keep my medicine? This drug is given in a hospital or clinic and will not be stored at home. NOTE: This sheet is a summary. It may not cover all possible information. If you have questions about this medicine, talk to your doctor, pharmacist, or health care provider.  2015, [employer]. (2013-03-28 22:21:02)  Cyclophosphamide injection What is this medicine? CYCLOPHOSPHAMIDE (sye kloe FOSS fa mide) is a chemotherapy drug. It slows the growth of cancer cells. This medicine is used to treat many types of cancer like lymphoma, myeloma, leukemia, breast cancer, and ovarian cancer, to name a few. This medicine may be used for other purposes; ask your health care provider or pharmacist if you have  questions. COMMON BRAND NAME(S): Cytoxan, Neosar What should I tell my health care provider before I take this medicine? They need to know if you have any of these conditions: -blood disorders -history of other chemotherapy -infection -kidney disease -liver disease -recent or ongoing radiation therapy -tumors in the bone marrow -an unusual or allergic reaction to cyclophosphamide, other chemotherapy, other medicines, foods, dyes, or preservatives -pregnant or trying to get pregnant -breast-feeding How should I use this medicine? This drug is usually given as an injection into a vein or muscle or by infusion into a vein. It is administered in a hospital or clinic by a specially trained health care professional. Talk to your pediatrician regarding the use of this medicine in children. Special care may be needed. Overdosage: If you think you have taken too much of this medicine contact a poison control center or emergency room at once. NOTE: This medicine is only for you. Do not share this medicine with others. What if I miss a dose? It is important not to miss your dose. Call your doctor or health care professional if you are unable to keep an appointment. What may interact with this medicine? This medicine may interact with the following medications: -amiodarone -amphotericin B -azathioprine -certain antiviral medicines for HIV or AIDS such as protease inhibitors (e.g., indinavir, ritonavir) and zidovudine -certain blood pressure medications such as benazepril, captopril, enalapril, fosinopril, lisinopril, moexipril, monopril, perindopril, quinapril, ramipril, trandolapril -certain cancer medications such as anthracyclines (e.g., daunorubicin, doxorubicin), busulfan, cytarabine, paclitaxel, pentostatin, tamoxifen, trastuzumab -certain diuretics such as chlorothiazide, chlorthalidone, hydrochlorothiazide, indapamide, metolazone -certain medicines that treat or prevent blood clots like  warfarin -certain muscle relaxants such as succinylcholine -cyclosporine -etanercept -indomethacin -medicines to increase blood counts like filgrastim, pegfilgrastim, sargramostim -medicines used as general anesthesia -metronidazole -natalizumab This list may not describe all possible interactions. Give your health care provider a list of all the medicines, herbs, non-prescription drugs, or dietary supplements you use. Also tell them if you smoke, drink alcohol, or use illegal drugs. Some items may interact with your medicine. What should I watch for while using this medicine? Visit your doctor for checks on your progress. This drug may make you feel generally unwell. This is not uncommon, as chemotherapy can affect healthy cells as well as cancer cells. Report any side effects. Continue your course of treatment even though you feel ill unless your doctor tells you to stop. Drink water or other fluids as directed. Urinate often, even at night. In some cases, you may be given additional medicines to help with side effects. Follow all directions for their use. Call your doctor or health care professional for advice if you get a fever, chills or sore throat, or other symptoms of a cold or flu. Do not treat yourself. This drug decreases your body's ability to fight infections. Try to avoid being around people who are sick. This medicine may increase your risk to bruise or bleed. Call your doctor or health care professional if you notice any unusual bleeding. Be careful brushing and flossing your teeth or using a toothpick because you may get an infection or bleed  more easily. If you have any dental work done, tell your dentist you are receiving this medicine. You may get drowsy or dizzy. Do not drive, use machinery, or do anything that needs mental alertness until you know how this medicine affects you. Do not become pregnant while taking this medicine or for 1 year after stopping it. Women should  inform their doctor if they wish to become pregnant or think they might be pregnant. Men should not father a child while taking this medicine and for 4 months after stopping it. There is a potential for serious side effects to an unborn child. Talk to your health care professional or pharmacist for more information. Do not breast-feed an infant while taking this medicine. This medicine may interfere with the ability to have a child. This medicine has caused ovarian failure in some women. This medicine has caused reduced sperm counts in some men. You should talk with your doctor or health care professional if you are concerned about your fertility. If you are going to have surgery, tell your doctor or health care professional that you have taken this medicine. What side effects may I notice from receiving this medicine? Side effects that you should report to your doctor or health care professional as soon as possible: -allergic reactions like skin rash, itching or hives, swelling of the face, lips, or tongue -low blood counts - this medicine may decrease the number of white blood cells, red blood cells and platelets. You may be at increased risk for infections and bleeding. -signs of infection - fever or chills, cough, sore throat, pain or difficulty passing urine -signs of decreased platelets or bleeding - bruising, pinpoint red spots on the skin, black, tarry stools, blood in the urine -signs of decreased red blood cells - unusually weak or tired, fainting spells, lightheadedness -breathing problems -dark urine -dizziness -palpitations -swelling of the ankles, feet, hands -trouble passing urine or change in the amount of urine -weight gain -yellowing of the eyes or skin Side effects that usually do not require medical attention (report to your doctor or health care professional if they continue or are bothersome): -changes in nail or skin color -hair loss -missed menstrual periods -mouth  sores -nausea, vomiting This list may not describe all possible side effects. Call your doctor for medical advice about side effects. You may report side effects to FDA at 1-800-FDA-1088. Where should I keep my medicine? This drug is given in a hospital or clinic and will not be stored at home. NOTE: This sheet is a summary. It may not cover all possible information. If you have questions about this medicine, talk to your doctor, pharmacist, or health care provider.  2015, [employer]. (2012-03-16 16:22:58)

## 2014-10-16 ENCOUNTER — Telehealth: Payer: Self-pay | Admitting: *Deleted

## 2014-10-16 ENCOUNTER — Encounter: Payer: Self-pay | Admitting: Nurse Practitioner

## 2014-10-16 DIAGNOSIS — W19XXXA Unspecified fall, initial encounter: Secondary | ICD-10-CM | POA: Insufficient documentation

## 2014-10-16 NOTE — Progress Notes (Signed)
SYMPTOM MANAGEMENT CLINIC   HPI: Angela Bond 69 y.o. female diagnosed with breast cancer.  Presents to the [facility] today to receive cycle 1 of her Taxotere/Cytoxan chemotherapy regimen.  Patient has a history of chronic knee pain; has underwent bilateral knee replacements several years ago.  Patient states that she fell last week; injuring her right knee and right ankle.  She reports that she fell once again this morning.-Landing on her right knee once again.  She states that her shoes have been "tripping her up".  She denies hitting her head or loss of consciousness.  She continues to walk with assistance of a cane.  HPI  ROS  Past Medical History  Diagnosis Date  . Arthritis   . Hypertension   . GERD (gastroesophageal reflux disease)   . Hypercholesteremia   . Heart murmur   . Anxiety and depression   . H/O hiatal hernia   . Headache(784.0)     sinus  . Dysrhythmia   . Shortness of breath     "stomach is in lungs" when had hernia 2014 none now(07/28/14)  . Pneumonia     hx  . Depression   . Anxiety   . Anemia     hx  . Shingles 5/15    hx    Past Surgical History  Procedure Laterality Date  . Total knee arthroplasty Bilateral   . Esophageal manometry N/A 07/02/2012    Procedure: ESOPHAGEAL MANOMETRY (EM);  Surgeon: Lear Ng, MD;  Location: [facility];  Service: Endoscopy;  Laterality: N/A;  schooler to read  . Tonsillectomy  as child  . Hiatal hernia repair N/A 08/10/2012    Procedure: LAPAROSCOPIC REPAIR OF HIATAL HERNIA WITH MESH AND EGD WITH PEG TUBE PLACEMENT;  Surgeon: Ralene Ok, MD;  Location: [facility];  Service: General;  Laterality: N/A;  . Breast surgery      r breast biopsy  . Joint replacement Bilateral 2011,2012    knees  . Eye surgery  2013    tube placed Left ,tear duct  . Breast lumpectomy with sentinel lymph node biopsy Right 09/22/2014    Procedure: RIGHT BREAST LUMPECTOMY WITH NEEDLE LOCALIZATION AND RIGHT AXILLARY  SENTINEL LYMPH NODE BIOPSY;  Surgeon: [name], MD;  Location: [facility];  Service: General;  Laterality: Right;  . Portacath placement Left 09/22/2014    Procedure: INSERTION PORT-A-CATH;  Surgeon: [name], MD;  Location: [facility];  Service: General;  Laterality: Left;    has Other and unspecified angina pectoris; Sliding hiatal hernia s/p lap PEH repair; Atrial fibrillation; Hypertension; Abdominal pain, unspecified site; GERD (gastroesophageal reflux disease); Depression; Hypercholesteremia; Arthritis; Chest pressure; Hypotension due to drugs; Shingles; Hyponatremia; Hyperkalemia; AKI (acute kidney injury); Lumbar spinal stenosis; Breast cancer of upper-outer quadrant of right female breast; and Fall on her problem list.    is allergic to shellfish allergy; lactose intolerance (gi); aleve; aspirin; ciprofloxacin; doxycycline; evista; fosamax; pravachol; sulfa antibiotics; welchol; and zocor.    Medication List       This list is accurate as of: 10/15/14 11:59 PM.  Always use your most recent med list.               ALPRAZolam 0.25 MG tablet  Commonly known as:  XANAX  Take 0.25 mg by mouth at bedtime as needed for sleep or anxiety.     calcium carbonate 600 MG Tabs tablet  Commonly known as:  OS-CAL  Take 600 mg by mouth 2 (two)  times daily with a meal.     carboxymethylcellulose 0.5 % Soln  Commonly known as:  REFRESH PLUS  1 drop 4 (four) times daily.     dexamethasone 4 MG tablet  Commonly known as:  DECADRON  Take 1 tablet (4 mg total) by mouth 2 (two) times daily. Start the day before Taxotere. Then again the day after chemo for 3 days.     dexlansoprazole 60 MG capsule  Commonly known as:  DEXILANT  Take 60 mg by mouth daily.     diphenhydrAMINE 25 mg capsule  Commonly known as:  BENADRYL  Take 25 mg by mouth at bedtime as needed for sleep.     fexofenadine 180 MG tablet  Commonly known as:  ALLEGRA  Take 180 mg by mouth daily.     FLUoxetine 20 MG capsule    Commonly known as:  PROZAC  Take 20 mg by mouth daily.     gabapentin 300 MG capsule  Commonly known as:  NEURONTIN  Take 300 mg by mouth at bedtime.     lidocaine-prilocaine cream  Commonly known as:  EMLA  Apply to affected area once     LORazepam 0.5 MG tablet  Commonly known as:  ATIVAN  Take 1 tablet (0.5 mg total) by mouth at bedtime.     lovastatin 40 MG tablet  Commonly known as:  MEVACOR  Take 40 mg by mouth daily before breakfast.     meclizine 25 MG tablet  Commonly known as:  ANTIVERT  Take 12.5-25 mg by mouth 3 (three) times daily as needed for dizziness.     meloxicam 15 MG tablet  Commonly known as:  MOBIC  Take 15 mg by mouth daily.     ondansetron 4 MG disintegrating tablet  Commonly known as:  ZOFRAN-ODT     ondansetron 8 MG tablet  Commonly known as:  ZOFRAN  Take 1 tablet (8 mg total) by mouth 2 (two) times daily. Start the day after chemo for 3 days. Then take as needed for nausea or vomiting.     Oxycodone HCl 10 MG Tabs     oxyCODONE-acetaminophen 5-325 MG per tablet  Commonly known as:  ROXICET  Take 1-2 tablets by mouth every 4 (four) hours as needed.     polyethylene glycol packet  Commonly known as:  MIRALAX / GLYCOLAX  Take 17 g by mouth as needed.     prochlorperazine 10 MG tablet  Commonly known as:  COMPAZINE  Take 1 tablet (10 mg total) by mouth every 6 (six) hours as needed (Nausea or vomiting).     sodium chloride 0.65 % Soln nasal spray  Commonly known as:  OCEAN  Place 2 sprays into both nostrils as needed for congestion.     Vitamin D 2000 UNITS tablet  Take 2,000 Units by mouth 2 (two) times daily.     zolpidem 5 MG tablet  Commonly known as:  AMBIEN         PHYSICAL EXAMINATION  Oncology Vitals 10/15/2014 10/15/2014 10/15/2014 10/15/2014 10/15/2014 10/15/2014 10/15/2014  Height - - - - - - -  Weight - - - - - - -  Weight (lbs) - - - - - - -  BMI (kg/m2) - - - - - - -  Temp 99 97.5 99 98.8 97.7 98.4 98.9  Pulse 59 62 62 62  72 61 80  Resp '18 18 18 18 18 18 18  ' SpO2 98 99 98 100 100 100  100  BSA (m2) - - - - - - -   BP Readings from Last 3 Encounters:  10/15/14 128/71  10/14/14 155/76  09/30/14 129/73    Physical Exam  Constitutional: She is oriented to person, place, and time and well-developed, well-nourished, and in no distress.  HENT:  Head: Normocephalic and atraumatic.  Eyes: Conjunctivae and EOM are normal. Pupils are equal, round, and reactive to light. Right eye exhibits no discharge. Left eye exhibits no discharge. No scleral icterus.  Neck: Normal range of motion.  Pulmonary/Chest: Effort normal. No respiratory distress.  Musculoskeletal: Normal range of motion. She exhibits edema and tenderness.  On exam.-Right knee area with healed knee replacement surgical site.  Entire anterior knee with both old, healing bruising; as well as some new bruising.  Also, patient has some edema and increased tenderness to the right anterior knee as well.  Right ankle with mild tenderness with palpation; and some trace tenderness with range of motion.  Patient continues able to ambulate and bear weight with no difficulty.    Neurological: She is alert and oriented to person, place, and time.  Skin: Skin is warm and dry. No rash noted. No erythema. No pallor.  Psychiatric: Affect normal.  Nursing note and vitals reviewed.   LABORATORY DATA:. Appointment on 10/14/2014  Component Date Value Ref Range Status  . WBC 10/14/2014 6.5  3.9 - 10.3 10e3/uL Final  . NEUT# 10/14/2014 4.2  1.5 - 6.5 10e3/uL Final  . HGB 10/14/2014 10.0* 11.6 - 15.9 g/dL Final  . HCT 10/14/2014 30.2* 34.8 - 46.6 % Final  . Platelets 10/14/2014 174  145 - 400 10e3/uL Final  . MCV 10/14/2014 79.8  79.5 - 101.0 fL Final  . MCH 10/14/2014 26.5  25.1 - 34.0 pg Final  . MCHC 10/14/2014 33.2  31.5 - 36.0 g/dL Final  . RBC 10/14/2014 3.78  3.70 - 5.45 10e6/uL Final  . RDW 10/14/2014 14.3  11.2 - 14.5 % Final  . lymph# 10/14/2014 1.5  0.9 -  3.3 10e3/uL Final  . MONO# 10/14/2014 0.5  0.1 - 0.9 10e3/uL Final  . Eosinophils Absolute 10/14/2014 0.1  0.0 - 0.5 10e3/uL Final  . Basophils Absolute 10/14/2014 0.1  0.0 - 0.1 10e3/uL Final  . NEUT% 10/14/2014 65.1  38.4 - 76.8 % Final  . LYMPH% 10/14/2014 23.7  14.0 - 49.7 % Final  . MONO% 10/14/2014 8.1  0.0 - 14.0 % Final  . EOS% 10/14/2014 2.2  0.0 - 7.0 % Final  . BASO% 10/14/2014 0.9  0.0 - 2.0 % Final  . Sodium 10/14/2014 142  136 - 145 mEq/L Final  . Potassium 10/14/2014 3.7  3.5 - 5.1 mEq/L Final  . Chloride 10/14/2014 104  98 - 109 mEq/L Final  . CO2 10/14/2014 27  22 - 29 mEq/L Final  . Glucose 10/14/2014 93  70 - 140 mg/dl Final  . BUN 10/14/2014 9.1  7.0 - 26.0 mg/dL Final  . Creatinine 10/14/2014 0.8  0.6 - 1.1 mg/dL Final  . Total Bilirubin 10/14/2014 0.46  0.20 - 1.20 mg/dL Final  . Alkaline Phosphatase 10/14/2014 75  40 - 150 U/L Final  . AST 10/14/2014 15  5 - 34 U/L Final  . ALT 10/14/2014 8  0 - 55 U/L Final  . Total Protein 10/14/2014 6.8  6.4 - 8.3 g/dL Final  . Albumin 10/14/2014 3.2* 3.5 - 5.0 g/dL Final  . Calcium 10/14/2014 8.5  8.4 - 10.4 mg/dL Final  . Anion  Gap 10/14/2014 11  3 - 11 mEq/L Final  . EGFR 10/14/2014 80* >90 ml/min/1.73 m2 Final   eGFR is calculated using the CKD-EPI Creatinine Equation (2009)     RADIOGRAPHIC STUDIES: Dg Ankle Complete Right  10/15/2014   CLINICAL DATA:  Medial an anterior right ankle pain after falling today while walking to the cancer center.  EXAM: RIGHT ANKLE - COMPLETE 3+ VIEW  COMPARISON:  Right foot radiographs dated 08/13/2013.  FINDINGS: Posterior and inferior calcaneal spurs with minimal progression. Diffuse lateral soft tissue swelling. No fracture, dislocation or effusion seen. Metatarsal/tarsal degenerative changes.  IMPRESSION: 1. No fracture or effusion. 2. Degenerative changes.   Electronically Signed   By: Claudie Revering M.D.   On: 10/15/2014 15:21   Dg Knee Complete 4 Views Right  10/15/2014   CLINICAL DATA:   Fall walking into building. Right knee pain and bruising. Initial encounter.  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 No evidence of fracture or dislocation. No evidence of hardware failure or loosening. No evidence of knee joint effusion.  IMPRESSION: Total knee arthroplasty.  No acute findings.   Electronically Signed   By: Earle Gell M.D.   On: 10/15/2014 15:22    ASSESSMENT/PLAN:    Breast cancer of upper-outer quadrant of right female breast Patient presented to the [facility] today to receive cycle one of her Taxotere/Cytoxan chemotherapy regimen.  Patient has plans to return on 10/21/2014 for labs and a follow-up visit.   Fall Patient has a history of chronic knee pain; has underwent bilateral knee replacements several years ago.  Patient states that she fell last week; injuring her right knee and right ankle.  She reports that she fell once again this morning.-Landing on her right knee once again.  She states that her shoes have been "tripping her up".  She denies hitting her head or loss of consciousness.  She continues to walk with assistance of a cane.  On exam.-Right knee area with healed knee replacement surgical site.  Entire anterior knee with both old, healing bruising; as well as some new bruising.  Also, patient has some edema and increased tenderness to the right anterior knee as well.  Right ankle with mild tenderness with palpation; and some trace tenderness with range of motion.  Patient continues able to ambulate and bear weight with no difficulty.  Will obtain x-rays of both the right knee in the right ankle for further evaluation.  Also, advised patient to elevate right lower extremity above the level of heart whenever possible.  She may also try ice and wrapping her right knee area of.  Advised patient would arrange for follow-up with her orthopedist, Dr. Percell Miller for as soon as possible for  further evaluation and management.     Patient stated understanding of all instructions; and was in agreement with this plan of care. The patient knows to call the clinic with any problems, questions or concerns.   Review/collaboration with Dr. Lindi Adie regarding all aspects of patient's visit today.   Total time spent with patient was 40 minutes;  with greater than 75 percent of that time spent in face to face counseling regarding patient's symptoms,  and coordination of care and follow up.  Disclaimer: This note was dictated with voice recognition software. Similar sounding words can inadvertently be transcribed and may not be corrected upon review.   Drue Second, NP 10/16/2014

## 2014-10-16 NOTE — Telephone Encounter (Signed)
Called Dr Murphy's office and made appt with Dr. Arlyn Leak for today at 330 to assess her R knee.  Spoke to pt- pt reports she is very sore today after her fall yesterday. She states her "knees are black and swollen" She reports some nausea that has been resolved with the "nausea pill at 8am" Pt confirms appt with Dr. Arlyn Leak and understands to call this office with any further concerns for her nausea.

## 2014-10-16 NOTE — Assessment & Plan Note (Signed)
Patient presented to the [facility] today to receive cycle one of her Taxotere/Cytoxan chemotherapy regimen.  Patient has plans to return on 10/21/2014 for labs and a follow-up visit.

## 2014-10-16 NOTE — Telephone Encounter (Signed)
Patient received 1st TC yesterday. Patient denies nausea, vomiting or diarrhea. States that stomach feels "queasy". She is taking her Compazine and is drinking but has not had anything to eat yet today. Advised patient to eat small frequent meals and to take nausea meds as prescribed. Advised to call for any questions or concerns. She verbalized understanding.

## 2014-10-16 NOTE — Assessment & Plan Note (Signed)
Patient has a history of chronic knee pain; has underwent bilateral knee replacements several years ago.  Patient states that she fell last week; injuring her right knee and right ankle.  She reports that she fell once again this morning.-Landing on her right knee once again.  She states that her shoes have been "tripping her up".  She denies hitting her head or loss of consciousness.  She continues to walk with assistance of a cane.  On exam.-Right knee area with healed knee replacement surgical site.  Entire anterior knee with both old, healing bruising; as well as some new bruising.  Also, patient has some edema and increased tenderness to the right anterior knee as well.  Right ankle with mild tenderness with palpation; and some trace tenderness with range of motion.  Patient continues able to ambulate and bear weight with no difficulty.  Will obtain x-rays of both the right knee in the right ankle for further evaluation.  Also, advised patient to elevate right lower extremity above the level of heart whenever possible.  She may also try ice and wrapping her right knee area of.  Advised patient would arrange for follow-up with her orthopedist, Dr. Percell Miller for as soon as possible for further evaluation and management.

## 2014-10-21 ENCOUNTER — Ambulatory Visit (HOSPITAL_BASED_OUTPATIENT_CLINIC_OR_DEPARTMENT_OTHER): Payer: Medicare Other

## 2014-10-21 ENCOUNTER — Emergency Department (HOSPITAL_COMMUNITY): Payer: Medicare Other

## 2014-10-21 ENCOUNTER — Telehealth: Payer: Self-pay | Admitting: Hematology and Oncology

## 2014-10-21 ENCOUNTER — Encounter (HOSPITAL_COMMUNITY): Payer: Self-pay | Admitting: Emergency Medicine

## 2014-10-21 ENCOUNTER — Other Ambulatory Visit: Payer: Self-pay

## 2014-10-21 ENCOUNTER — Other Ambulatory Visit: Payer: Self-pay | Admitting: *Deleted

## 2014-10-21 ENCOUNTER — Other Ambulatory Visit (HOSPITAL_BASED_OUTPATIENT_CLINIC_OR_DEPARTMENT_OTHER): Payer: Medicare Other

## 2014-10-21 ENCOUNTER — Other Ambulatory Visit (HOSPITAL_COMMUNITY): Payer: Self-pay

## 2014-10-21 ENCOUNTER — Inpatient Hospital Stay (HOSPITAL_COMMUNITY)
Admission: EM | Admit: 2014-10-21 | Discharge: 2014-10-24 | DRG: 809 | Disposition: A | Discharge status: Home / Self Care | Payer: Medicare Other | Attending: Internal Medicine | Admitting: Internal Medicine

## 2014-10-21 ENCOUNTER — Telehealth: Payer: Self-pay | Admitting: *Deleted

## 2014-10-21 ENCOUNTER — Ambulatory Visit (HOSPITAL_BASED_OUTPATIENT_CLINIC_OR_DEPARTMENT_OTHER): Payer: Medicare Other | Admitting: Hematology and Oncology

## 2014-10-21 ENCOUNTER — Ambulatory Visit: Payer: Medicare Other

## 2014-10-21 VITALS — BP 137/75 | HR 99 | Temp 99.2°F | Resp 18 | Ht 60.0 in | Wt 166.2 lb

## 2014-10-21 VITALS — BP 140/75 | HR 98 | Temp 99.6°F | Resp 18

## 2014-10-21 DIAGNOSIS — K219 Gastro-esophageal reflux disease without esophagitis: Secondary | ICD-10-CM | POA: Diagnosis present

## 2014-10-21 DIAGNOSIS — E860 Dehydration: Secondary | ICD-10-CM

## 2014-10-21 DIAGNOSIS — D701 Agranulocytosis secondary to cancer chemotherapy: Secondary | ICD-10-CM

## 2014-10-21 DIAGNOSIS — Z95828 Presence of other vascular implants and grafts: Secondary | ICD-10-CM

## 2014-10-21 DIAGNOSIS — N390 Urinary tract infection, site not specified: Secondary | ICD-10-CM | POA: Diagnosis present

## 2014-10-21 DIAGNOSIS — F419 Anxiety disorder, unspecified: Secondary | ICD-10-CM | POA: Diagnosis present

## 2014-10-21 DIAGNOSIS — M7989 Other specified soft tissue disorders: Secondary | ICD-10-CM | POA: Diagnosis present

## 2014-10-21 DIAGNOSIS — C50411 Malignant neoplasm of upper-outer quadrant of right female breast: Secondary | ICD-10-CM

## 2014-10-21 DIAGNOSIS — Z882 Allergy status to sulfonamides status: Secondary | ICD-10-CM

## 2014-10-21 DIAGNOSIS — R5081 Fever presenting with conditions classified elsewhere: Secondary | ICD-10-CM | POA: Diagnosis present

## 2014-10-21 DIAGNOSIS — B961 Klebsiella pneumoniae [K. pneumoniae] as the cause of diseases classified elsewhere: Secondary | ICD-10-CM | POA: Diagnosis present

## 2014-10-21 DIAGNOSIS — D649 Anemia, unspecified: Secondary | ICD-10-CM

## 2014-10-21 DIAGNOSIS — Z171 Estrogen receptor negative status [ER-]: Secondary | ICD-10-CM | POA: Diagnosis not present

## 2014-10-21 DIAGNOSIS — R609 Edema, unspecified: Secondary | ICD-10-CM | POA: Diagnosis not present

## 2014-10-21 DIAGNOSIS — E876 Hypokalemia: Secondary | ICD-10-CM | POA: Diagnosis present

## 2014-10-21 DIAGNOSIS — F329 Major depressive disorder, single episode, unspecified: Secondary | ICD-10-CM | POA: Diagnosis present

## 2014-10-21 DIAGNOSIS — D703 Neutropenia due to infection: Secondary | ICD-10-CM | POA: Diagnosis present

## 2014-10-21 DIAGNOSIS — E780 Pure hypercholesterolemia: Secondary | ICD-10-CM | POA: Diagnosis present

## 2014-10-21 DIAGNOSIS — E785 Hyperlipidemia, unspecified: Secondary | ICD-10-CM | POA: Diagnosis present

## 2014-10-21 DIAGNOSIS — Z886 Allergy status to analgesic agent status: Secondary | ICD-10-CM | POA: Diagnosis not present

## 2014-10-21 DIAGNOSIS — M1990 Unspecified osteoarthritis, unspecified site: Secondary | ICD-10-CM | POA: Diagnosis present

## 2014-10-21 DIAGNOSIS — Z881 Allergy status to other antibiotic agents status: Secondary | ICD-10-CM

## 2014-10-21 DIAGNOSIS — I10 Essential (primary) hypertension: Secondary | ICD-10-CM | POA: Diagnosis present

## 2014-10-21 DIAGNOSIS — Z452 Encounter for adjustment and management of vascular access device: Secondary | ICD-10-CM | POA: Diagnosis not present

## 2014-10-21 DIAGNOSIS — D709 Neutropenia, unspecified: Secondary | ICD-10-CM | POA: Diagnosis not present

## 2014-10-21 DIAGNOSIS — Z853 Personal history of malignant neoplasm of breast: Secondary | ICD-10-CM | POA: Diagnosis not present

## 2014-10-21 DIAGNOSIS — Z931 Gastrostomy status: Secondary | ICD-10-CM | POA: Diagnosis not present

## 2014-10-21 DIAGNOSIS — Z96653 Presence of artificial knee joint, bilateral: Secondary | ICD-10-CM | POA: Diagnosis present

## 2014-10-21 DIAGNOSIS — R509 Fever, unspecified: Secondary | ICD-10-CM | POA: Diagnosis present

## 2014-10-21 DIAGNOSIS — Z91013 Allergy to seafood: Secondary | ICD-10-CM | POA: Diagnosis not present

## 2014-10-21 DIAGNOSIS — Z888 Allergy status to other drugs, medicaments and biological substances status: Secondary | ICD-10-CM | POA: Diagnosis not present

## 2014-10-21 DIAGNOSIS — C773 Secondary and unspecified malignant neoplasm of axilla and upper limb lymph nodes: Secondary | ICD-10-CM

## 2014-10-21 LAB — COMPREHENSIVE METABOLIC PANEL
ALT: 11 U/L — ABNORMAL LOW (ref 14–54)
ANION GAP: 12 (ref 5–15)
AST: 12 U/L — ABNORMAL LOW (ref 15–41)
Albumin: 3.2 g/dL — ABNORMAL LOW (ref 3.5–5.0)
Alkaline Phosphatase: 67 U/L (ref 38–126)
BILIRUBIN TOTAL: 0.9 mg/dL (ref 0.3–1.2)
BUN: 15 mg/dL (ref 6–20)
CO2: 26 mmol/L (ref 22–32)
Calcium: 8.5 mg/dL — ABNORMAL LOW (ref 8.9–10.3)
Chloride: 101 mmol/L (ref 101–111)
Creatinine, Ser: 0.7 mg/dL (ref 0.44–1.00)
GFR calc Af Amer: >60 mL/min (ref >60)
GFR calc non Af Amer: >60 mL/min (ref >60)
Glucose, Bld: 102 mg/dL — ABNORMAL HIGH (ref 65–99)
POTASSIUM: 3.1 mmol/L — AB (ref 3.5–5.1)
SODIUM: 139 mmol/L (ref 135–145)
Total Protein: 6.6 g/dL (ref 6.5–8.1)

## 2014-10-21 LAB — IRON AND TIBC CHCC
%SAT: 4 % — ABNORMAL LOW (ref 21–57)
Iron: 12 ug/dL — ABNORMAL LOW (ref 41–142)
TIBC: 298 ug/dL (ref 236–444)
UIBC: 286 ug/dL (ref 120–384)

## 2014-10-21 LAB — CBC WITH DIFFERENTIAL/PLATELET
BASO%: 2.4 % — ABNORMAL HIGH (ref 0.0–2.0)
Basophils Absolute: 0 10*3/uL (ref 0.0–0.1)
Basophils Absolute: 0 10*3/uL (ref 0.0–0.1)
Basophils Relative: 1 % (ref 0–1)
EOS%: 10.6 % — ABNORMAL HIGH (ref 0.0–7.0)
Eosinophils Absolute: 0.1 10*3/uL (ref 0.0–0.5)
Eosinophils Absolute: 0.1 10*3/uL (ref 0.0–0.7)
Eosinophils Relative: 8 % — ABNORMAL HIGH (ref 0–5)
HEMATOCRIT: 29.2 % — AB (ref 34.8–46.6)
HEMATOCRIT: 27.5 % — AB (ref 36.0–46.0)
HGB: 9.4 g/dL — ABNORMAL LOW (ref 11.6–15.9)
Hemoglobin: 8.6 g/dL — ABNORMAL LOW (ref 12.0–15.0)
LYMPH#: 0.5 10*3/uL — AB (ref 0.9–3.3)
LYMPH%: 57.6 % — AB (ref 14.0–49.7)
Lymphocytes Relative: 56 % — ABNORMAL HIGH (ref 12–46)
Lymphs Abs: 0.6 10*3/uL — ABNORMAL LOW (ref 0.7–4.0)
MCH: 26 pg (ref 25.1–34.0)
MCH: 25.7 pg — ABNORMAL LOW (ref 26.0–34.0)
MCHC: 32.2 g/dL (ref 31.5–36.0)
MCHC: 31.3 g/dL (ref 30.0–36.0)
MCV: 80.9 fL (ref 79.5–101.0)
MCV: 82.1 fL (ref 78.0–100.0)
MONO ABS: 0.3 10*3/uL (ref 0.1–1.0)
MONO#: 0.2 10*3/uL (ref 0.1–0.9)
MONO%: 23.5 % — AB (ref 0.0–14.0)
Monocytes Relative: 31 % — ABNORMAL HIGH (ref 3–12)
NEUT#: 0.1 10*3/uL — CL (ref 1.5–6.5)
NEUT%: 5.9 % — ABNORMAL LOW (ref 38.4–76.8)
NEUTROS ABS: 0 10*3/uL — AB (ref 1.7–7.7)
NEUTROS PCT: 4 % — AB (ref 43–77)
Platelets: 144 10*3/uL — ABNORMAL LOW (ref 145–400)
Platelets: 149 10*3/uL — ABNORMAL LOW (ref 150–400)
RBC: 3.61 10*6/uL — AB (ref 3.70–5.45)
RBC: 3.35 MIL/uL — ABNORMAL LOW (ref 3.87–5.11)
RDW: 13.9 % (ref 11.2–14.5)
RDW: 13.9 % (ref 11.5–15.5)
WBC: 0.9 10*3/uL — CL (ref 3.9–10.3)
WBC: 1 10*3/uL — CL (ref 4.0–10.5)

## 2014-10-21 LAB — URINALYSIS, ROUTINE W REFLEX MICROSCOPIC
Bilirubin Urine: NEGATIVE
Glucose, UA: NEGATIVE mg/dL
Hgb urine dipstick: NEGATIVE
KETONES UR: NEGATIVE mg/dL
NITRITE: POSITIVE — AB
PROTEIN: NEGATIVE mg/dL
Specific Gravity, Urine: 1.016 (ref 1.005–1.030)
UROBILINOGEN UA: 2 mg/dL — AB (ref 0.0–1.0)
pH: 6.5 (ref 5.0–8.0)

## 2014-10-21 LAB — COMPREHENSIVE METABOLIC PANEL (CC13)
ALT: 7 U/L (ref 0–55)
ANION GAP: 9 meq/L (ref 3–11)
AST: 11 U/L (ref 5–34)
Albumin: 3.1 g/dL — ABNORMAL LOW (ref 3.5–5.0)
Alkaline Phosphatase: 77 U/L (ref 40–150)
BILIRUBIN TOTAL: 0.77 mg/dL (ref 0.20–1.20)
BUN: 14.7 mg/dL (ref 7.0–26.0)
CALCIUM: 8.8 mg/dL (ref 8.4–10.4)
CO2: 28 meq/L (ref 22–29)
Chloride: 101 mEq/L (ref 98–109)
Creatinine: 0.7 mg/dL (ref 0.6–1.1)
EGFR: 88 mL/min/{1.73_m2} — ABNORMAL LOW (ref >90)
GLUCOSE: 103 mg/dL (ref 70–140)
POTASSIUM: 3.4 meq/L — AB (ref 3.5–5.1)
SODIUM: 138 meq/L (ref 136–145)
Total Protein: 6.6 g/dL (ref 6.4–8.3)

## 2014-10-21 LAB — I-STAT TROPONIN, ED: TROPONIN I, POC: 0.02 ng/mL (ref 0.00–0.08)

## 2014-10-21 LAB — URINE MICROSCOPIC-ADD ON

## 2014-10-21 LAB — I-STAT CG4 LACTIC ACID, ED: Lactic Acid, Venous: 0.82 mmol/L (ref 0.5–2.0)

## 2014-10-21 LAB — FERRITIN CHCC: Ferritin: 291 ng/ml — ABNORMAL HIGH (ref 9–269)

## 2014-10-21 MED ORDER — DEXTROSE 5 % IV SOLN
2.0000 g | Freq: Three times a day (TID) | INTRAVENOUS | Status: DC
  Administered 2014-10-21 – 2014-10-24 (×8): 2 g via INTRAVENOUS
  Filled 2014-10-21 (×10): qty 2

## 2014-10-21 MED ORDER — SODIUM CHLORIDE 0.9 % IV BOLUS (SEPSIS)
1000.0000 mL | Freq: Once | INTRAVENOUS | Status: AC
  Administered 2014-10-21: 1000 mL via INTRAVENOUS

## 2014-10-21 MED ORDER — SODIUM CHLORIDE 0.9 % IJ SOLN
10.0000 mL | INTRAMUSCULAR | Status: DC | PRN
  Administered 2014-10-21 (×2): 10 mL via INTRAVENOUS
  Filled 2014-10-21: qty 10

## 2014-10-21 MED ORDER — OXYCODONE-ACETAMINOPHEN 5-325 MG PO TABS
1.0000 | ORAL_TABLET | ORAL | Status: DC | PRN
  Administered 2014-10-21: 2 via ORAL
  Administered 2014-10-22: 1 via ORAL
  Administered 2014-10-22 – 2014-10-24 (×6): 2 via ORAL
  Filled 2014-10-21 (×6): qty 2
  Filled 2014-10-21: qty 1
  Filled 2014-10-21: qty 2

## 2014-10-21 MED ORDER — HEPARIN SOD (PORK) LOCK FLUSH 100 UNIT/ML IV SOLN
500.0000 [IU] | INTRAVENOUS | Status: AC | PRN
  Administered 2014-10-21: 500 [IU]
  Filled 2014-10-21: qty 5

## 2014-10-21 MED ORDER — SODIUM CHLORIDE 0.9 % IJ SOLN
10.0000 mL | INTRAMUSCULAR | Status: DC | PRN
  Filled 2014-10-21: qty 10

## 2014-10-21 MED ORDER — VANCOMYCIN HCL 10 G IV SOLR
1250.0000 mg | Freq: Once | INTRAVENOUS | Status: AC
Start: 2014-10-21 — End: 2014-10-22
  Administered 2014-10-21: 1250 mg via INTRAVENOUS
  Filled 2014-10-21: qty 1250

## 2014-10-21 MED ORDER — VANCOMYCIN HCL IN DEXTROSE 1-5 GM/200ML-% IV SOLN
1000.0000 mg | Freq: Two times a day (BID) | INTRAVENOUS | Status: DC
  Administered 2014-10-22 – 2014-10-23 (×3): 1000 mg via INTRAVENOUS
  Filled 2014-10-21 (×3): qty 200

## 2014-10-21 MED ORDER — NYSTATIN 100000 UNIT/ML MT SUSP: 5.0000 mL | Freq: Four times a day (QID) | OROMUCOSAL | Status: DC

## 2014-10-21 MED ORDER — SODIUM CHLORIDE 0.9 % IV SOLN
Freq: Once | INTRAVENOUS | Status: AC
  Administered 2014-10-21: 11:00:00 via INTRAVENOUS

## 2014-10-21 NOTE — Patient Instructions (Signed)
Oral Mucositis Oral mucositis is a mouth condition that may develop from treatment used to cure cancer. With this condition, sores may appear on your lips, gums, tongue,and the roof or floor of your mouth. CAUSES  Oral mucositis can happen to anyone who is being treated with cancer therapies, including:  Cancer drugs (chemotherapy).  Radiation (X-ray or other high-energy rays) for head or neck cancer.  Bone marrow transplants and stem cell transplants. Oralmucositis is not caused by infection. However, the sores can become infected after they form. Infection can make oral mucositis worse. The following factors increase your risk of oral mucositis:  Poor oral hygiene.  Dental problems or oral diseases.  Smoking.  Chewing tobacco.  Drinking alcohol.  Having other diseases such as diabetes, human immunodeficiency virus (HIV), acquired immunodeficiency syndrome (AIDS), or kidney disease.  Not drinking enough water.  Having dentures that do not fit right.  Being a child. Children are more likely than adults to develop oral mucositis, but children usually heal more quickly.  Being elderly. Elderly adults are more likely to develop oral mucositis. SYMPTOMS  Symptoms vary. They may be mild or severe. Symptoms usually show up 7 to 10 days after starting treatment. Symptoms may include:   Sores in the mouth that bleed.  Color changes inside the mouth. Red, shiny areas appear.  White patches or pus in the mouth.  Pain in the mouth and throat.  Pain when talking.  Dryness and a burning feeling in the mouth.  Saliva that is dry and thick.  Trouble eating, drinking, and swallowing.  Weight loss and malnutrition. This happens because eating is a problem. DIAGNOSIS  A caregiver will check your mouth. Then, the condition is given a grade. This grading system will help your caregiver treat your condition:  Grade 1: The inside of the mouth is sore and red.  Grade 2: There  is redness in the mouth. Open sores are present. Swallowing food might be uncomfortable.  Grade 3: There are open sores. The mouth is very red. It is very hard to swallow food.  Grade 4: No food or drink can be swallowed. TREATMENT  Oral mucositis usually heals on its own. Sometimes, changes in the cancer treatment can help. Keeping the mouth as clean and germ free as possible is very important.  Medicine may ease the condition. Different types of medicine may be needed, such as:  An antibiotic to fight infection, if present.  Medicine to help mucosal cells heal more quickly.  A water-based moisturizer for your lips, if they are affected.  Methods to control pain may include:  Keeping your mouth moist. You may suck on ice chips or sugar-free frozen ice pops.  Pain relievers that are swished around in the mouth. They will make the mouth numb to ease the pain (topical anesthetics).  Specific mouth rinses.  Prescribed, medicated gels. The gel coats the mouth. This protects nerve endings and lowers pain.  Narcotic pain medicines. These are strong drugs. They may be used if pain is very bad.  Mouth care can keep the mouth as healthy as possible and help to prevent infection. Mouth care includes:  A dental checkup. Your dental caregiver will make sure you have no teeth problems that could cause infection. Try to have the dental checkup before you begin your treatment for cancer.  Brushing your teeth several times a day. Use a soft toothbrush. Change to a new brush often. Use only gentle toothpastes. Ask your caregiver what product would   be best for you. Make sure that you also floss your teeth.  Rinsing your mouth after every meal. Rinse again at bedtime. Do not use mouthwash that contains alcohol. Ask your caregiver what would be best for you. HOME CARE INSTRUCTIONS  Only take over-the-counter or prescription medicines for pain, discomfort, or fever as directed by your caregiver.  Follow the directions carefully.  Do not smoke.  Do not drink alcohol.  Eat only bland, soft foods until your mouth sores heal. Avoid sugary and acidic foods and drinks.  Ask your caregiver if you should add high-protein shakes to your diet to avoid malnutrition and weight loss.  Drink enough fluids to keep your urine clear or pale yellow.  If you have dentures, take them out often.  Continue to check your mouth every day for any signs of oral mucositis.  Keep all follow-up appointments. SEEK MEDICAL CARE IF:  You notice redness, soreness, or dryness in your mouth.  You have mouth or throat pain that makes it hard to swallow or speak. SEEK IMMEDIATE MEDICAL CARE IF:  Your pain in your mouth or throat gets worse and does not improve with pain medicine.  You have a lot of bleeding in your mouth.  You develop new, open sores in your mouth.  You notice patches of pus forming in your mouth.  You cannot swallow solid food or liquids.  You have a fever. Document Released: 12/17/2010 Document Revised: 07/25/2011 Document Reviewed: 12/17/2010 ExitCare Patient Information 2015 [employer]. This information is not intended to replace advice given to you by your health care provider. Make sure you discuss any questions you have with your health care provider. Dehydration, Adult Dehydration is when you lose more fluids from the body than you take in. Vital organs like the kidneys, brain, and heart cannot function without a proper amount of fluids and salt. Any loss of fluids from the body can cause dehydration.  CAUSES   Vomiting.  Diarrhea.  Excessive sweating.  Excessive urine output.  Fever. SYMPTOMS  Mild dehydration  Thirst.  Dry lips.  Slightly dry mouth. Moderate dehydration  Very dry mouth.  Sunken eyes.  Skin does not bounce back quickly when lightly pinched and released.  Dark urine and decreased urine production.  Decreased tear  production.  Headache. Severe dehydration  Very dry mouth.  Extreme thirst.  Rapid, weak pulse (more than 100 beats per minute at rest).  Cold hands and feet.  Not able to sweat in spite of heat and temperature.  Rapid breathing.  Blue lips.  Confusion and lethargy.  Difficulty being awakened.  Minimal urine production.  No tears. DIAGNOSIS  Your caregiver will diagnose dehydration based on your symptoms and your exam. Blood and urine tests will help confirm the diagnosis. The diagnostic evaluation should also identify the cause of dehydration. TREATMENT  Treatment of mild or moderate dehydration can often be done at home by increasing the amount of fluids that you drink. It is best to drink small amounts of fluid more often. Drinking too much at one time can make vomiting worse. Refer to the home care instructions below. Severe dehydration needs to be treated at the hospital where you will probably be given intravenous (IV) fluids that contain water and electrolytes. HOME CARE INSTRUCTIONS   Ask your caregiver about specific rehydration instructions.  Drink enough fluids to keep your urine clear or pale yellow.  Drink small amounts frequently if you have nausea and vomiting.  Eat as you normally   do.  Avoid:  Foods or drinks high in sugar.  Carbonated drinks.  Juice.  Extremely hot or cold fluids.  Drinks with caffeine.  Fatty, greasy foods.  Alcohol.  Tobacco.  Overeating.  Gelatin desserts.  Wash your hands well to avoid spreading bacteria and viruses.  Only take over-the-counter or prescription medicines for pain, discomfort, or fever as directed by your caregiver.  Ask your caregiver if you should continue all prescribed and over-the-counter medicines.  Keep all follow-up appointments with your caregiver. SEEK MEDICAL CARE IF:  You have abdominal pain and it increases or stays in one area (localizes).  You have a rash, stiff neck, or  severe headache.  You are irritable, sleepy, or difficult to awaken.  You are weak, dizzy, or extremely thirsty. SEEK IMMEDIATE MEDICAL CARE IF:   You are unable to keep fluids down or you get worse despite treatment.  You have frequent episodes of vomiting or diarrhea.  You have blood or green matter (bile) in your vomit.  You have blood in your stool or your stool looks black and tarry.  You have not urinated in 6 to 8 hours, or you have only urinated a small amount of very dark urine.  You have a fever.  You faint. MAKE SURE YOU:   Understand these instructions.  Will watch your condition.  Will get help right away if you are not doing well or get worse. Document Released: 05/02/2005 Document Revised: 07/25/2011 Document Reviewed: 12/20/2010 ExitCare Patient Information 2015 [employer]. This information is not intended to replace advice given to you by your health care provider. Make sure you discuss any questions you have with your health care provider.  

## 2014-10-21 NOTE — Assessment & Plan Note (Signed)
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 Adjuvant chemo with TC X 4 (I chose his regimen because patient appears to be slightly older than her stated age and I'm concerned about toxicity to standard full dose chemotherapy with Adriamycin and Cytoxan followed by Taxol. In addition patient is also very reluctant to consider aggressive chemotherapy because she felt that her sister died because of toxicities of chemotherapy.) 2. Adjuvant XRT  Current treatment: Cycle 1 day 1 10/15/2014 of Taxotere Cytoxan Chemotherapy toxicities: 1.   Anemia: Iron studies being done today Recent knee injury: Doing better today Return to clinic in 2 weeks for cycle 2

## 2014-10-21 NOTE — Patient Instructions (Signed)

## 2014-10-21 NOTE — ED Notes (Signed)
Unable to obtain enough blood for two bottles on first stick. One blue top blood culture bottle sent down with 2cc. Lab to attempt to obtain second set.

## 2014-10-21 NOTE — Progress Notes (Signed)
Patient Care Team: Gaynelle Arabian, MD as PCP - General (Family Medicine) Jerline Pain, MD as Consulting Physician (Cardiology) Wonda Horner, MD as Consulting Physician (Gastroenterology) Ralene Ok, MD as Consulting Physician (Surgery)  DIAGNOSIS: No matching staging information was found for the patient.  SUMMARY OF ONCOLOGIC HISTORY:   Breast cancer of upper-outer quadrant of right female breast   07/30/2014 Mammogram Right breast 10 o'clock location 4 cm from the nipple measuring 1.7 x 1.3 x 1.3 cm. This corresponds to the palpable and mammographic finding. In the right axilla, there are several lymph nodes which are normal in size measuring up to 54mm.   08/27/2014 Initial Diagnosis Right Breast 10:00: IDC with LVI, Grade 3, Er 0%, PR 0%, Her 2 Neg, Ki 67: 30%   09/22/2014 Surgery Rt Lumpectomy: IDC grade 3, Size 2.6 cm, with HG DCIS, LVI present, ER/PR: 0%; 1/2 LN Positive: T2N1M0 Stage 2B   10/15/2014 -  Chemotherapy Adjuvant chemotherapy with Taxotere Cytoxan 4 q3 weeks    CHIEF COMPLIANT: Cycle 1 day 8 of Taxotere and Cytoxan adjuvant chemotherapy  INTERVAL HISTORY: Angela Bond is a 69 year old with above-mentioned history of right-sided breast cancer treated with lumpectomy and is currently on adjuvant chemotherapy. She received first cycle of chemotherapy week ago and is here today for nadir counts checked. She reports mouth sores, profound fatigue. She had a bowel movement yesterday was having constipation issues. Had mild nausea but no vomiting. Her husband has been admitted with gallbladder problem and she is having to take care of herself images become very difficult. She is trying to eat food and drink liquids but it has been very difficult. She has a very low-grade temp of 99.  REVIEW OF SYSTEMS:   Constitutional: Low-grade temperature, severe fatigue Eyes: Denies blurriness of vision Ears, nose, mouth, throat, and face: Gum sores with whitish patches Respiratory: Denies  cough, dyspnea or wheezes Cardiovascular: Denies palpitation, chest discomfort or lower extremity swelling Gastrointestinal:  Constipation Skin: Dryness of skin Lymphatics: Denies new lymphadenopathy or easy bruising Neurological:Denies numbness, tingling or new weaknesses Behavioral/Psych: Mood is stable, no new changes   All other systems were reviewed with the patient and are negative.  I have reviewed the past medical history, past surgical history, social history and family history with the patient and they are unchanged from previous note.  ALLERGIES:  is allergic to shellfish allergy; lactose intolerance (gi); aleve; aspirin; ciprofloxacin; doxycycline; evista; fosamax; pravachol; sulfa antibiotics; welchol; and zocor.  MEDICATIONS:  Current Outpatient Prescriptions  Medication Sig Dispense Refill  . ALPRAZolam (XANAX) 0.25 MG tablet Take 0.25 mg by mouth at bedtime as needed for sleep or anxiety.     . calcium carbonate (OS-CAL) 600 MG TABS Take 600 mg by mouth 2 (two) times daily with a meal.    . carboxymethylcellulose (REFRESH PLUS) 0.5 % SOLN 1 drop 4 (four) times daily.     . Cholecalciferol (VITAMIN D) 2000 UNITS tablet Take 2,000 Units by mouth 2 (two) times daily.     Marland Kitchen dexamethasone (DECADRON) 4 MG tablet Take 1 tablet (4 mg total) by mouth 2 (two) times daily. Start the day before Taxotere. Then again the day after chemo for 3 days. 30 tablet 1  . dexlansoprazole (DEXILANT) 60 MG capsule Take 60 mg by mouth daily.    . diphenhydrAMINE (BENADRYL) 25 mg capsule Take 25 mg by mouth at bedtime as needed for sleep.    . fexofenadine (ALLEGRA) 180 MG tablet Take 180 mg by  mouth daily.    Marland Kitchen FLUoxetine (PROZAC) 20 MG capsule Take 20 mg by mouth daily.  11  . gabapentin (NEURONTIN) 300 MG capsule Take 300 mg by mouth at bedtime.     . lidocaine-prilocaine (EMLA) cream Apply to affected area once 30 g 3  . LORazepam (ATIVAN) 0.5 MG tablet Take 1 tablet (0.5 mg total) by mouth at  bedtime. 30 tablet 0  . lovastatin (MEVACOR) 40 MG tablet Take 40 mg by mouth daily before breakfast.     . meclizine (ANTIVERT) 25 MG tablet Take 12.5-25 mg by mouth 3 (three) times daily as needed for dizziness.    . meloxicam (MOBIC) 15 MG tablet Take 15 mg by mouth daily.     . ondansetron (ZOFRAN) 8 MG tablet Take 1 tablet (8 mg total) by mouth 2 (two) times daily. Start the day after chemo for 3 days. Then take as needed for nausea or vomiting. 30 tablet 1  . ondansetron (ZOFRAN-ODT) 4 MG disintegrating tablet     . Oxycodone HCl 10 MG TABS   0  . oxyCODONE-acetaminophen (ROXICET) 5-325 MG per tablet Take 1-2 tablets by mouth every 4 (four) hours as needed. 30 tablet 0  . polyethylene glycol (MIRALAX / GLYCOLAX) packet Take 17 g by mouth as needed.    . prochlorperazine (COMPAZINE) 10 MG tablet Take 1 tablet (10 mg total) by mouth every 6 (six) hours as needed (Nausea or vomiting). 30 tablet 1  . sodium chloride (OCEAN) 0.65 % SOLN nasal spray Place 2 sprays into both nostrils as needed for congestion.    Marland Kitchen zolpidem (AMBIEN) 5 MG tablet   0   No current facility-administered medications for this visit.   Facility-Administered Medications Ordered in Other Visits  Medication Dose Route Frequency Provider Last Rate Last Dose  . sodium chloride 0.9 % injection 10 mL  10 mL Intravenous PRN Nicholas Lose, MD   10 mL at 10/21/14 1011    PHYSICAL EXAMINATION: ECOG PERFORMANCE STATUS: 1 - Symptomatic but completely ambulatory  There were no vitals filed for this visit. There were no vitals filed for this visit.  GENERAL:alert, no distress and comfortable SKIN: skin color, texture, turgor are normal, no rashes or significant lesions EYES: normal, Conjunctiva are pink and non-injected, sclera clear OROPHARYNX: Ulcers along her gums with whitish patches suggestive of thrush NECK: supple, thyroid normal size, non-tender, without nodularity LYMPH:  no palpable lymphadenopathy in the cervical,  axillary or inguinal LUNGS: clear to auscultation and percussion with normal breathing effort HEART: regular rate & rhythm and no murmurs and no lower extremity edema ABDOMEN:abdomen soft, non-tender and normal bowel sounds Musculoskeletal:no cyanosis of digits and no clubbing  NEURO: alert & oriented x 3 with fluent speech, no focal motor/sensory deficits  LABORATORY DATA:  I have reviewed the data as listed   Chemistry      Component Value Date/Time   NA 142 10/14/2014 0806   NA 138 09/18/2014 0858   K 3.7 10/14/2014 0806   K 3.5 09/18/2014 0858   CL 100* 09/18/2014 0858   CO2 27 10/14/2014 0806   CO2 26 09/18/2014 0858   BUN 9.1 10/14/2014 0806   BUN 12 09/18/2014 0858   CREATININE 0.8 10/14/2014 0806   CREATININE 0.86 09/18/2014 0858      Component Value Date/Time   CALCIUM 8.5 10/14/2014 0806   CALCIUM 9.2 09/18/2014 0858   ALKPHOS 75 10/14/2014 0806   ALKPHOS 81 09/20/2013 1426   AST 15 10/14/2014 0806  AST 16 09/20/2013 1426   ALT 8 10/14/2014 0806   ALT 14 09/20/2013 1426   BILITOT 0.46 10/14/2014 0806   BILITOT 0.5 09/20/2013 1426       Lab Results  Component Value Date   WBC 6.5 10/14/2014   HGB 10.0* 10/14/2014   HCT 30.2* 10/14/2014   MCV 79.8 10/14/2014   PLT 174 10/14/2014   NEUTROABS 4.2 10/14/2014    ASSESSMENT & PLAN:  Breast cancer of upper-outer quadrant of right female breast Rt Lumpectomy: IDC grade 3, 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 Adjuvant chemo with TC X 4 (I chose his regimen because patient appears to be slightly older than her stated age and I'm concerned about toxicity to standard full dose chemotherapy with Adriamycin and Cytoxan followed by Taxol. In addition patient is also very reluctant to consider aggressive chemotherapy because she felt that her sister died because of toxicities of chemotherapy.) 2. Adjuvant XRT  Current  treatment: Cycle 1 day 1 10/15/2014 of Taxotere Cytoxan Chemotherapy toxicities: 1. Oral thrush: Nystatin swish and swallow has been prescribed 2. Clinical dehydration: I recommended fiber cc normal saline 3. Severe fatigue: Decrease the dosage of chemotherapy 4. Neutropenia grade 3: Expected for this time as a nadir counts checked. However I would like to decrease the dosage of chemotherapy given her profound symptoms. I recommended facemask and neutropenic precautions.  Anemia: Iron studies being done today Recent knee injury: Doing better today  Return to clinic in 2 weeks for cycle 2  No orders of the defined types were placed in this encounter.   The patient has a good understanding of the overall plan. she agrees with it. she will call with any problems that may develop before the next visit here.   Rulon Eisenmenger, MD

## 2014-10-21 NOTE — ED Notes (Signed)
Main lab called for stick for blood cultures. They advised that it might be awhile as they were busy. RN reminded them that we cannot hang antibiotics until [facility] are drawn.

## 2014-10-21 NOTE — ED Notes (Signed)
Pt c/o pain. Hospitalist at bedside now.

## 2014-10-21 NOTE — ED Notes (Signed)
Pt states she started chemo for breast CA on Wednesday and has felt weak ever since and had a 100 degree fever. 99.2 here. Also c/o chest tightness and abdominal pain. Alert and oriented.

## 2014-10-21 NOTE — Telephone Encounter (Signed)
Per 6/7 pof,patient is already on schedule for 6/21

## 2014-10-21 NOTE — Progress Notes (Signed)
Pt arrived to flush room 2, stated she was not feeling well.  She stated no vomiting or nausea this morning but temp was 99.4 also stated she was not having any pain. Vital signs 99.6, 140/75, 98, 18, 02 SAT's 98% RA. Notified Dr. Lindi Adie nurse Charyl Bigger but Dr. Lindi Adie answered phone and he was informed of pt condition. Informed pt  Port will be  left accessed until further evaluation and MD nurse will remove. Pt verbalize understanding.

## 2014-10-21 NOTE — Progress Notes (Signed)
ANTIBIOTIC CONSULT NOTE - INITIAL  Pharmacy Consult for Vancomycin, Zosyn Indication: Febrile neutropenia  Allergies  Allergen Reactions  . Shellfish Allergy Anaphylaxis    ANAPHYLAXIS TO OYSTERS  . Lactose Intolerance (Gi) Other (See Comments)  . Aleve [Naproxen] Nausea And Vomiting  . Aspirin Other (See Comments)    Burns stomach  . Ciprofloxacin Nausea And Vomiting  . Doxycycline Nausea And Vomiting  . Evista [Raloxifene] Other (See Comments)    Leg aches  . Fosamax [Alendronate Sodium] Other (See Comments)    Leg aches  . Pravachol [Pravastatin] Other (See Comments)    GI upset   . Sulfa Antibiotics Nausea Only  . Welchol [Colesevelam Hcl] Other (See Comments)    Stomach cramps  . Zocor [Simvastatin] Rash    Patient Measurements:    Vital Signs: Temp: 99.5 F (37.5 C) (06/07 1655) Temp Source: Oral (06/07 1655) BP: 148/67 mmHg (06/07 1902) Pulse Rate: 93 (06/07 1902) Intake/Output from previous day:   Intake/Output from this shift:    Labs:  Recent Labs  10/21/14 0955 10/21/14 0956 10/21/14 1723  WBC 0.9*  --  1.0*  HGB 9.4*  --  8.6*  PLT 144*  --  149*  CREATININE  --  0.7 0.70   Estimated Creatinine Clearance: 60.2 mL/min (by C-G formula based on Cr of 0.7). No results for input(s): VANCOTROUGH, VANCOPEAK, VANCORANDOM, GENTTROUGH, GENTPEAK, GENTRANDOM, TOBRATROUGH, TOBRAPEAK, TOBRARND, AMIKACINPEAK, AMIKACINTROU, AMIKACIN in the last 72 hours.   Microbiology: No results found for this or any previous visit (from the past 720 hour(s)).  Medical History: Past Medical History  Diagnosis Date  . Arthritis   . Hypertension   . GERD (gastroesophageal reflux disease)   . Hypercholesteremia   . Heart murmur   . Anxiety and depression   . H/O hiatal hernia   . Headache(784.0)     sinus  . Dysrhythmia   . Shortness of breath     "stomach is in lungs" when had hernia 2014 none now(07/28/14)  . Pneumonia     hx  . Depression   . Anxiety   .  Anemia     hx  . Shingles 5/15    hx  . Breast cancer 3/16    Medications:  Anti-infectives    Start     Dose/Rate Route Frequency Ordered Stop   10/22/14 0800  vancomycin (VANCOCIN) IVPB 1000 mg/200 mL premix     1,000 mg 200 mL/hr over 60 Minutes Intravenous Every 12 hours 10/21/14 2019     10/21/14 2030  vancomycin (VANCOCIN) 1,250 mg in sodium chloride 0.9 % 250 mL IVPB     1,250 mg 166.7 mL/hr over 90 Minutes Intravenous  Once 10/21/14 2018     10/21/14 2030  cefTAZidime (FORTAZ) 2 g in dextrose 5 % 50 mL IVPB     2 g 100 mL/hr over 30 Minutes Intravenous 3 times per day 10/21/14 2018       Assessment: 69 y.o. female presents with generalized weakness. She has breast cancer is on her first cycle chemotherapy. She is on day 8 of her cycle. Seen by her oncologist earlier today. At that time had temperature up to 99.6. States she went home after one bag of IV fluid and still felt weak. Fever up to 100.0. Slight occasional cough. Has sore throat and was diagnosed with thrush. Is known to be neutropenic on lab work from earlier today. To begin ceftazidime (cefepime on national backorder) and vancomycin for febrile neutropenia.  Does  have indwelling line for chemo.  Goal of Therapy:  Vancomycin trough level 15-20 mcg/ml  Eradication of infection Appropriate antibiotic dosing for indication and renal function  Plan:  Day 1 antibiotics Ceftazidime 2 g IV q8 hr Vancomycin 1250 mg IV now, then 1000 mg IV q12 hr Measure vancomycin trough levels at steady state as indicated  Follow clinical course, renal function, culture results as available  Follow for de-escalation of antibiotics and LOT   Reuel Boom, PharmD Pager: [phone] 10/21/2014, 9:12 PM

## 2014-10-21 NOTE — H&P (Signed)
History and Physical  Angela Bond IHK:[id number] DOB: 07/10/1945 DOA: 10/21/2014  Referring physician: Dr Alvino Chapel, ED physician PCP: Simona Huh, MD   Chief Complaint:  Fever, nausea  HPI: Angela Bond is a 69 y.o. female   with a history of hypertension, hyperlipidemia, GERD, anxiety, arthritis , breast cancer with lumpectomy and initiation of chemotherapy approximately 6 days ago with Taxotere Cytoxan. A few days after starting her chemotherapy he started having  Elevated temperatures , nausea,  And generalized malaise. These symptoms have been worsening over the past several days. She denies palliating or provoking factors. She called her oncologist recommended she go to the hospital for evaluation.   Review of Systems:   Pt complains of  Fevers, mild abdominal pain, constipation, nausea.  Pt denies any  Chills, chest pain, shortness of breath, headaches, vision changes,  Diarrhea, hematochezia, melana.  Review of systems are otherwise negative  Past Medical History  Diagnosis Date  . Arthritis   . Hypertension   . GERD (gastroesophageal reflux disease)   . Hypercholesteremia   . Heart murmur   . Anxiety and depression   . H/O hiatal hernia   . Headache(784.0)     sinus  . Dysrhythmia   . Shortness of breath     "stomach is in lungs" when had hernia 2014 none now(07/28/14)  . Pneumonia     hx  . Depression   . Anxiety   . Anemia     hx  . Shingles 5/15    hx  . Breast cancer 3/16   Past Surgical History  Procedure Laterality Date  . Total knee arthroplasty Bilateral   . Esophageal manometry N/A 07/02/2012    Procedure: ESOPHAGEAL MANOMETRY (EM);  Surgeon: Lear Ng, MD;  Location: [facility];  Service: Endoscopy;  Laterality: N/A;  schooler to read  . Tonsillectomy  as child  . Hiatal hernia repair N/A 08/10/2012    Procedure: LAPAROSCOPIC REPAIR OF HIATAL HERNIA WITH MESH AND EGD WITH PEG TUBE PLACEMENT;  Surgeon: Ralene Ok, MD;   Location: [facility];  Service: General;  Laterality: N/A;  . Breast surgery      r breast biopsy  . Joint replacement Bilateral 2011,2012    knees  . Eye surgery  2013    tube placed Left ,tear duct  . Breast lumpectomy with sentinel lymph node biopsy Right 09/22/2014    Procedure: RIGHT BREAST LUMPECTOMY WITH NEEDLE LOCALIZATION AND RIGHT AXILLARY SENTINEL LYMPH NODE BIOPSY;  Surgeon: [name], MD;  Location: [facility];  Service: General;  Laterality: Right;  . Portacath placement Left 09/22/2014    Procedure: INSERTION PORT-A-CATH;  Surgeon: [name], MD;  Location: [place];  Service: General;  Laterality: Left;   Social History:  reports that she has never smoked. She has never used smokeless tobacco. She reports that she does not drink alcohol or use illicit drugs. Patient lives at  home & is able to participate in activities of daily living  Allergies  Allergen Reactions  . Shellfish Allergy Anaphylaxis    ANAPHYLAXIS TO OYSTERS  . Lactose Intolerance (Gi) Other (See Comments)  . Aleve [Naproxen] Nausea And Vomiting  . Aspirin Other (See Comments)    Burns stomach  . Ciprofloxacin Nausea And Vomiting  . Doxycycline Nausea And Vomiting  . Evista [Raloxifene] Other (See Comments)    Leg aches  . Fosamax [Alendronate Sodium] Other (See Comments)    Leg aches  . Pravachol [Pravastatin] Other (See  Comments)    GI upset   . Sulfa Antibiotics Nausea Only  . Welchol [Colesevelam Hcl] Other (See Comments)    Stomach cramps  . Zocor [Simvastatin] Rash    Family History  Problem Relation Age of Onset  . Heart disease Mother     23s, CHF  . Heart disease Father     d/o MI at 37  . Cancer Father     skin  . Cancer Sister     breast  . Heart disease Sister     CABG in [place]      Prior to Admission medications   Medication Sig Start Date End Date Taking? Authorizing Provider  calcium carbonate (OS-CAL) 600 MG TABS Take 600 mg by mouth 2 (two) times daily with a meal.   Yes  Historical Provider, MD  carboxymethylcellulose (REFRESH PLUS) 0.5 % SOLN 1 drop 4 (four) times daily.    Yes Historical Provider, MD  Cholecalciferol (VITAMIN D) 2000 UNITS tablet Take 2,000 Units by mouth 2 (two) times daily.    Yes Historical Provider, MD  dexamethasone (DECADRON) 4 MG tablet Take 1 tablet (4 mg total) by mouth 2 (two) times daily. Start the day before Taxotere. Then again the day after chemo for 3 days. 09/30/14  Yes Nicholas Lose, MD  dexlansoprazole (DEXILANT) 60 MG capsule Take 60 mg by mouth daily.   Yes Historical Provider, MD  fexofenadine (ALLEGRA) 180 MG tablet Take 180 mg by mouth daily.   Yes Historical Provider, MD  FLUoxetine (PROZAC) 20 MG capsule Take 20 mg by mouth daily. 06/24/14  Yes Historical Provider, MD  gabapentin (NEURONTIN) 300 MG capsule Take 300 mg by mouth at bedtime.  09/09/14  Yes Historical Provider, MD  lidocaine-prilocaine (EMLA) cream Apply to affected area once 09/30/14  Yes Nicholas Lose, MD  LORazepam (ATIVAN) 0.5 MG tablet Take 1 tablet (0.5 mg total) by mouth at bedtime. 09/30/14  Yes Nicholas Lose, MD  lovastatin (MEVACOR) 40 MG tablet Take 40 mg by mouth daily before breakfast.    Yes Historical Provider, MD  meclizine (ANTIVERT) 25 MG tablet Take 12.5-25 mg by mouth 3 (three) times daily as needed for dizziness.   Yes Historical Provider, MD  meloxicam (MOBIC) 15 MG tablet Take 15 mg by mouth daily.  09/09/14  Yes Historical Provider, MD  ondansetron (ZOFRAN) 8 MG tablet Take 1 tablet (8 mg total) by mouth 2 (two) times daily. Start the day after chemo for 3 days. Then take as needed for nausea or vomiting. 09/30/14  Yes Nicholas Lose, MD  oxyCODONE-acetaminophen (ROXICET) 5-325 MG per tablet Take 1-2 tablets by mouth every 4 (four) hours as needed. 10/15/14  Yes Susanne Borders, NP  polyethylene glycol (MIRALAX / GLYCOLAX) packet Take 17 g by mouth as needed for mild constipation.    Yes Historical Provider, MD  [place]; [place].    Yes Historical Provider, MD  prochlorperazine (COMPAZINE) 10 MG tablet Take 1 tablet (10 mg total) by mouth every 6 (six) hours as needed (Nausea or vomiting). 09/30/14  Yes Nicholas Lose, MD  sodium chloride (OCEAN) 0.65 % SOLN nasal spray Place 2 sprays into both nostrils as needed for congestion.   Yes Historical Provider, MD  nystatin (MYCOSTATIN) 100000 UNIT/ML suspension Take 5 mLs (500,000 Units total) by mouth 4 (four) times daily. 10/21/14   Nicholas Lose, MD    Physical Exam: BP 148/67 mmHg  Pulse 93  Temp(Src) 99.5 F (37.5 C) (Oral)  Resp 18  SpO2 99%  General:  Elderly Caucasian female. Awake and alert and oriented x3. No acute cardiopulmonary distress.  Eyes: Pupils equal, round, reactive to light. Extraocular muscles are intact. Sclerae anicteric and noninjected.  ENT:  Moist mucosal membranes. No mucosal lesions.   Neck: Neck supple without lymphadenopathy. No carotid bruits. No masses palpated.  Cardiovascular: Regular rate with normal S1-S2 sounds. No murmurs, rubs, gallops auscultated. No JVD.  Respiratory: Good respiratory effort with no wheezes, rales, rhonchi. Lungs clear to auscultation bilaterally.  Abdomen: Soft,  Mild left lower quadrant tenderness, nondistended. Active bowel sounds. No masses or hepatosplenomegaly  Skin: Dry, warm to touch. 2+ dorsalis pedis and radial pulses. Large ecchymosis on the right anterior shin from a fall in the infusion clinic last Wednesday Musculoskeletal: No calf or leg pain. All major joints not erythematous nontender.  Psychiatric: Intact judgment and insight.  Neurologic: No focal neurological deficits. Cranial nerves II through XII are grossly intact.           Labs on Admission:  Basic Metabolic Panel:  Recent Labs Lab 10/21/14 0956 10/21/14 1723  NA 138 139  K 3.4* 3.1*  CL  --  101  CO2 28 26  GLUCOSE 103 102*  BUN 14.7 15  CREATININE 0.7 0.70  CALCIUM 8.8 8.5*   Liver Function Tests:  Recent Labs Lab  10/21/14 0956 10/21/14 1723  AST 11 12*  ALT 7 11*  ALKPHOS 77 67  BILITOT 0.77 0.9  PROT 6.6 6.6  ALBUMIN 3.1* 3.2*   No results for input(s): LIPASE, AMYLASE in the last 168 hours. No results for input(s): AMMONIA in the last 168 hours. CBC:  Recent Labs Lab 10/21/14 0955 10/21/14 1723  WBC 0.9* 1.0*  NEUTROABS 0.1* 0.0*  HGB 9.4* 8.6*  HCT 29.2* 27.5*  MCV 80.9 82.1  PLT 144* 149*   Cardiac Enzymes: No results for input(s): CKTOTAL, CKMB, CKMBINDEX, TROPONINI in the last 168 hours.  BNP (last 3 results) No results for input(s): BNP in the last 8760 hours.  ProBNP (last 3 results) No results for input(s): PROBNP in the last 8760 hours.  CBG: No results for input(s): GLUCAP in the last 168 hours.  Radiological Exams on Admission: Dg Chest Portable 1 View  10/21/2014   CLINICAL DATA:  Central anterior chest pain today, fever, neutropenia, breast cancer on chemotherapy  EXAM: PORTABLE CHEST - 1 VIEW  COMPARISON:  Portable exam 1712 hours compared to 09/22/2014  FINDINGS: LEFT subclavian power port with tip projecting over SVC.  Normal heart size, mediastinal contours and pulmonary vascularity.  Mild tortuosity of thoracic aorta.  Question mild subsegmental atelectasis at lung bases.  Lungs otherwise clear.  No definite infiltrate, pleural effusion or pneumothorax.  Bones unremarkable.  IMPRESSION: Question mild bibasilar atelectasis.   Electronically Signed   By: Lavonia Dana M.D.   On: 10/21/2014 17:30    EKG: Independently reviewed.  Sinus rhythm with premature atrial contraction. No acute ST changes  Assessment/Plan Present on Admission:  . Neutropenia . UTI (lower urinary tract infection) . Hypokalemia  This patient was discussed with the ED physician, including pertinent vitals, physical exam findings, labs, and imaging.  We also discussed care given by the ED provider.    #1 neutropenia #2 UTI #3 hypokalemia  Admit to MedSurg  continue broad-spectrum  antibiotics -  Although the patient likely has one ofthe typical pathogens that cause UTIs, it is possible that she has more than 1 bacteria , therefore will treat patient with  broad-spectrum antibiotics. Will treat with vancomycin and  Ceftazidime.    urine cultures  Done in the ER  blood cultures done in the emergency room department  recheck CBC in the morning   replace potassium Recheck metabolic panel the morning  Continue home medications  DVT prophylaxis:  Lovenox  Consultants:  none  Code Status:  Full code  Family Communication:  none   Disposition Plan:  Home following improvement and negative  bloodcultures   Truett Mainland, DO [facility] Pager [phone]

## 2014-10-21 NOTE — Progress Notes (Signed)
EDCM spoke to patent at bedside.  Patient confirms she lives at home with her husband.  However her husband is currently at [facility] right now with gallbladder issues.  Patient reports she does not have home health services at this time, but has had [facility] in the past after her back surgery.  Patient would like home health services with [facility] again.  [facility] provided patient with list of home health agencies in [place] highlighting [place]. EDCM explained home health services and that home health agency of her choice has 24-48 hours to contact her.  Patient verbalized understanding.  Patient has a walker, cane, elevated toilet seat, and a shower chair at home.  Patient reports her husband usually helps her with her ADL's but she has been, "Doing the best I can."  Patient reports she usually uses a cane to ambulate, but since starting her chemotherapy, she has gotten weaker and has started to use her walker.  EDCM also provided patient with list of private duty nursing agencies and explained it would be an out of pocket expense for her.  Patient verbalized understanding.  EDCM placed agency lists in her belongings bag at bedside.  No further EDCM needs at this time.

## 2014-10-21 NOTE — ED Provider Notes (Signed)
CSN: [id number]     Arrival date & time 10/21/14  1640 History   First MD Initiated Contact with Patient 10/21/14 1655     Chief Complaint  Patient presents with  . Chemo Card   . Fever     (Consider location/radiation/quality/duration/timing/severity/associated sxs/prior Treatment) Patient is a 69 y.o. female presenting with fever. The history is provided by the patient.  Fever Max temp prior to arrival:  100.0 Associated symptoms: cough, myalgias and sore throat   Associated symptoms: no chest pain, no diarrhea, no headaches, no nausea, no rash and no vomiting    patient presents with generalized weakness. She has breast cancer is on her first cycle chemotherapy. She is on day 8 of her cycle. Seen by her oncologist earlier today. At that time had temperature up to 99.6. States she went home after one bag of IV fluid and still felt weak. Fever up to 100.0. Slight occasional cough. Has sore throat and was diagnosed with thrush. No diarrhea. No dysuria. Is known to be neutropenic on lab work from earlier today. States she just feels bad all over.  Past Medical History  Diagnosis Date  . Arthritis   . Hypertension   . GERD (gastroesophageal reflux disease)   . Hypercholesteremia   . Heart murmur   . Anxiety and depression   . H/O hiatal hernia   . Headache(784.0)     sinus  . Dysrhythmia   . Shortness of breath     "stomach is in lungs" when had hernia 2014 none now(07/28/14)  . Pneumonia     hx  . Depression   . Anxiety   . Anemia     hx  . Shingles 5/15    hx  . Breast cancer 3/16   Past Surgical History  Procedure Laterality Date  . Total knee arthroplasty Bilateral   . Esophageal manometry N/A 07/02/2012    Procedure: ESOPHAGEAL MANOMETRY (EM);  Surgeon: Lear Ng, MD;  Location: [facility];  Service: Endoscopy;  Laterality: N/A;  schooler to read  . Tonsillectomy  as child  . Hiatal hernia repair N/A 08/10/2012    Procedure: LAPAROSCOPIC REPAIR OF HIATAL  HERNIA WITH MESH AND EGD WITH PEG TUBE PLACEMENT;  Surgeon: Ralene Ok, MD;  Location: [facility];  Service: General;  Laterality: N/A;  . Breast surgery      r breast biopsy  . Joint replacement Bilateral 2011,2012    knees  . Eye surgery  2013    tube placed Left ,tear duct  . Breast lumpectomy with sentinel lymph node biopsy Right 09/22/2014    Procedure: RIGHT BREAST LUMPECTOMY WITH NEEDLE LOCALIZATION AND RIGHT AXILLARY SENTINEL LYMPH NODE BIOPSY;  Surgeon: [name], MD;  Location: [place];  Service: General;  Laterality: Right;  . Portacath placement Left 09/22/2014    Procedure: INSERTION PORT-A-CATH;  Surgeon: [name], MD;  Location: [place];  Service: General;  Laterality: Left;   Family History  Problem Relation Age of Onset  . Heart disease Mother     37s, CHF  . Heart disease Father     d/o MI at 26  . Cancer Father     skin  . Cancer Sister     breast  . Heart disease Sister     CABG in mid-60s   History  Substance Use Topics  . Smoking status: Never Smoker   . Smokeless tobacco: Never Used  . Alcohol Use: No   OB History  No data available     Review of Systems  Constitutional: Positive for fever and appetite change. Negative for activity change.  HENT: Positive for sore throat.   Eyes: Negative for pain.  Respiratory: Positive for cough. Negative for chest tightness and shortness of breath.   Cardiovascular: Negative for chest pain and leg swelling.  Gastrointestinal: Negative for nausea, vomiting, abdominal pain and diarrhea.  Genitourinary: Negative for flank pain.  Musculoskeletal: Positive for myalgias. Negative for back pain and neck stiffness.  Skin: Negative for rash.  Neurological: Positive for weakness. Negative for numbness and headaches.  Psychiatric/Behavioral: Negative for behavioral problems.      Allergies  Shellfish allergy; Lactose intolerance (gi); Aleve; Aspirin; Ciprofloxacin; Doxycycline; Evista; Fosamax; Pravachol; Sulfa  antibiotics; Welchol; and Zocor  Home Medications   Prior to Admission medications   Medication Sig Start Date End Date Taking? Authorizing Provider  calcium carbonate (OS-CAL) 600 MG TABS Take 600 mg by mouth 2 (two) times daily with a meal.   Yes Historical Provider, MD  carboxymethylcellulose (REFRESH PLUS) 0.5 % SOLN 1 drop 4 (four) times daily.    Yes Historical Provider, MD  Cholecalciferol (VITAMIN D) 2000 UNITS tablet Take 2,000 Units by mouth 2 (two) times daily.    Yes Historical Provider, MD  dexamethasone (DECADRON) 4 MG tablet Take 1 tablet (4 mg total) by mouth 2 (two) times daily. Start the day before Taxotere. Then again the day after chemo for 3 days. 09/30/14  Yes Nicholas Lose, MD  dexlansoprazole (DEXILANT) 60 MG capsule Take 60 mg by mouth daily.   Yes Historical Provider, MD  fexofenadine (ALLEGRA) 180 MG tablet Take 180 mg by mouth daily.   Yes Historical Provider, MD  FLUoxetine (PROZAC) 20 MG capsule Take 20 mg by mouth daily. 06/24/14  Yes Historical Provider, MD  gabapentin (NEURONTIN) 300 MG capsule Take 300 mg by mouth at bedtime.  09/09/14  Yes Historical Provider, MD  lidocaine-prilocaine (EMLA) cream Apply to affected area once 09/30/14  Yes Nicholas Lose, MD  LORazepam (ATIVAN) 0.5 MG tablet Take 1 tablet (0.5 mg total) by mouth at bedtime. 09/30/14  Yes Nicholas Lose, MD  lovastatin (MEVACOR) 40 MG tablet Take 40 mg by mouth daily before breakfast.    Yes Historical Provider, MD  meclizine (ANTIVERT) 25 MG tablet Take 12.5-25 mg by mouth 3 (three) times daily as needed for dizziness.   Yes Historical Provider, MD  meloxicam (MOBIC) 15 MG tablet Take 15 mg by mouth daily.  09/09/14  Yes Historical Provider, MD  ondansetron (ZOFRAN) 8 MG tablet Take 1 tablet (8 mg total) by mouth 2 (two) times daily. Start the day after chemo for 3 days. Then take as needed for nausea or vomiting. 09/30/14  Yes Nicholas Lose, MD  oxyCODONE-acetaminophen (ROXICET) 5-325 MG per tablet Take 1-2  tablets by mouth every 4 (four) hours as needed. 10/15/14  Yes Susanne Borders, NP  polyethylene glycol (MIRALAX / GLYCOLAX) packet Take 17 g by mouth as needed for mild constipation.    Yes Historical Provider, MD  [place]; Ronald.   Yes Historical Provider, MD  prochlorperazine (COMPAZINE) 10 MG tablet Take 1 tablet (10 mg total) by mouth every 6 (six) hours as needed (Nausea or vomiting). 09/30/14  Yes Nicholas Lose, MD  sodium chloride (OCEAN) 0.65 % SOLN nasal spray Place 2 sprays into both nostrils as needed for congestion.   Yes Historical Provider, MD  nystatin (MYCOSTATIN) 100000 UNIT/ML suspension Take 5 mLs (500,000 Units total)  by mouth 4 (four) times daily. 10/21/14   Nicholas Lose, MD   BP 111/57 mmHg  Pulse 80  Temp(Src) 99.3 F (37.4 C) (Oral)  Resp 18  Ht 5' (1.524 m)  Wt 167 lb 12.3 oz (76.1 kg)  BMI 32.77 kg/m2  SpO2 95% Physical Exam  Constitutional: She is oriented to person, place, and time. She appears well-developed and well-nourished.  HENT:  Some thrush on the posterior tongue.  Eyes: EOM are normal.  Neck: Neck supple.  Cardiovascular: Normal rate.   Pulmonary/Chest: Effort normal and breath sounds normal.  Port-A-Cath to chest wall  Abdominal: Soft. There is no tenderness.  Musculoskeletal: She exhibits no edema.  Neurological: She is alert and oriented to person, place, and time.  Skin: Skin is warm.    ED Course  Procedures (including critical care time) Labs Review Labs Reviewed  CBC WITH DIFFERENTIAL/PLATELET - Abnormal; Notable for the following:    WBC 1.0 (*)    RBC 3.35 (*)    Hemoglobin 8.6 (*)    HCT 27.5 (*)    MCH 25.7 (*)    Platelets 149 (*)    Neutrophils Relative % 4 (*)    Lymphocytes Relative 56 (*)    Monocytes Relative 31 (*)    Eosinophils Relative 8 (*)    Neutro Abs 0.0 (*)    Lymphs Abs 0.6 (*)    All other components within normal limits  COMPREHENSIVE METABOLIC PANEL - Abnormal; Notable for the  following:    Potassium 3.1 (*)    Glucose, Bld 102 (*)    Calcium 8.5 (*)    Albumin 3.2 (*)    AST 12 (*)    ALT 11 (*)    All other components within normal limits  URINALYSIS, ROUTINE W REFLEX MICROSCOPIC (NOT AT [facility]) - Abnormal; Notable for the following:    APPearance CLOUDY (*)    Urobilinogen, UA 2.0 (*)    Nitrite POSITIVE (*)    Leukocytes, UA SMALL (*)    All other components within normal limits  URINE MICROSCOPIC-ADD ON - Abnormal; Notable for the following:    Bacteria, UA MANY (*)    All other components within normal limits  CULTURE, BLOOD (ROUTINE X 2)  CULTURE, BLOOD (ROUTINE X 2)  URINE CULTURE  I-STAT TROPOININ, ED  I-STAT CG4 LACTIC ACID, ED    Imaging Review Dg Chest Portable 1 View  10/21/2014   CLINICAL DATA:  Central anterior chest pain today, fever, neutropenia, breast cancer on chemotherapy  EXAM: PORTABLE CHEST - 1 VIEW  COMPARISON:  Portable exam 1712 hours compared to 09/22/2014  FINDINGS: LEFT subclavian power port with tip projecting over SVC.  Normal heart size, mediastinal contours and pulmonary vascularity.  Mild tortuosity of thoracic aorta.  Question mild subsegmental atelectasis at lung bases.  Lungs otherwise clear.  No definite infiltrate, pleural effusion or pneumothorax.  Bones unremarkable.  IMPRESSION: Question mild bibasilar atelectasis.   Electronically Signed   By: Lavonia Dana M.D.   On: 10/21/2014 17:30     EKG Interpretation None      MDM   Final diagnoses:  Urinary tract infection without hematuria, site unspecified  Neutropenia    Patient with neutropenia. Had low-grade temperature at home and here. Discussed with patient's oncologist who recommended looking for infection in lungs and urine and if either one show infection or fever spiked a true fever to admit as needed neutropenic fever. Patient does have an apparent urinary tract infection. Will  admit to internal medicine. Blood and urine cultures have been  sent    Davonna Belling, MD 10/22/14 0009

## 2014-10-21 NOTE — Telephone Encounter (Signed)
TC from patient-she just returned home from visit with Dr. Lindi Adie and received 500 mls of IVFs. She states her temp is now 100 and she feels awful.  She states she feels so weak she can barely stand up.  Please advise...  3:12 pm  Spoke with Dr. Lindi Adie. He advised patient go to the [facility]. Called patient and informed her of Dr. Geralyn Flash advice.  She will have her neighbor drive her.

## 2014-10-21 NOTE — ED Notes (Signed)
I stuck patient for labs and was unsuccessful.

## 2014-10-22 ENCOUNTER — Telehealth: Payer: Self-pay | Admitting: *Deleted

## 2014-10-22 LAB — CBC
HCT: 25 % — ABNORMAL LOW (ref 36.0–46.0)
Hemoglobin: 7.9 g/dL — ABNORMAL LOW (ref 12.0–15.0)
MCH: 25.6 pg — AB (ref 26.0–34.0)
MCHC: 31.6 g/dL (ref 30.0–36.0)
MCV: 80.9 fL (ref 78.0–100.0)
PLATELETS: 122 10*3/uL — AB (ref 150–400)
RBC: 3.09 MIL/uL — AB (ref 3.87–5.11)
RDW: 13.9 % (ref 11.5–15.5)
WBC: 1.2 10*3/uL — CL (ref 4.0–10.5)

## 2014-10-22 LAB — BASIC METABOLIC PANEL
Anion gap: 8 (ref 5–15)
BUN: 9 mg/dL (ref 6–20)
CO2: 26 mmol/L (ref 22–32)
Calcium: 8.3 mg/dL — ABNORMAL LOW (ref 8.9–10.3)
Chloride: 104 mmol/L (ref 101–111)
Creatinine, Ser: 0.64 mg/dL (ref 0.44–1.00)
GLUCOSE: 90 mg/dL (ref 65–99)
Potassium: 3.8 mmol/L (ref 3.5–5.1)
Sodium: 138 mmol/L (ref 135–145)

## 2014-10-22 LAB — MRSA PCR SCREENING: MRSA BY PCR: POSITIVE — AB

## 2014-10-22 MED ORDER — ACETAMINOPHEN 325 MG PO TABS: 650.0000 mg | ORAL_TABLET | Freq: Four times a day (QID) | ORAL | Status: DC | PRN

## 2014-10-22 MED ORDER — ONDANSETRON HCL 4 MG/2ML IJ SOLN: 4.0000 mg | Freq: Four times a day (QID) | INTRAMUSCULAR | Status: DC | PRN

## 2014-10-22 MED ORDER — PANTOPRAZOLE SODIUM 40 MG PO TBEC
40.0000 mg | DELAYED_RELEASE_TABLET | Freq: Every day | ORAL | Status: DC
  Administered 2014-10-22 – 2014-10-24 (×3): 40 mg via ORAL
  Filled 2014-10-22 (×3): qty 1

## 2014-10-22 MED ORDER — LORAZEPAM 0.5 MG PO TABS
0.5000 mg | ORAL_TABLET | Freq: Every day | ORAL | Status: DC
  Administered 2014-10-22 – 2014-10-23 (×3): 0.5 mg via ORAL
  Filled 2014-10-22 (×3): qty 1

## 2014-10-22 MED ORDER — BISACODYL 5 MG PO TBEC: 5.0000 mg | DELAYED_RELEASE_TABLET | Freq: Every day | ORAL | Status: DC | PRN

## 2014-10-22 MED ORDER — ACETAMINOPHEN 650 MG RE SUPP: 650.0000 mg | Freq: Four times a day (QID) | RECTAL | Status: DC | PRN

## 2014-10-22 MED ORDER — FLUOXETINE HCL 20 MG PO CAPS
20.0000 mg | ORAL_CAPSULE | Freq: Every day | ORAL | Status: DC
  Administered 2014-10-22 – 2014-10-24 (×3): 20 mg via ORAL
  Filled 2014-10-22 (×3): qty 1

## 2014-10-22 MED ORDER — ATORVASTATIN CALCIUM 10 MG PO TABS
10.0000 mg | ORAL_TABLET | Freq: Every day | ORAL | Status: DC
  Administered 2014-10-22 – 2014-10-23 (×2): 10 mg via ORAL
  Filled 2014-10-22 (×3): qty 1

## 2014-10-22 MED ORDER — SENNOSIDES-DOCUSATE SODIUM 8.6-50 MG PO TABS
2.0000 | ORAL_TABLET | Freq: Two times a day (BID) | ORAL | Status: DC
  Administered 2014-10-22 – 2014-10-24 (×4): 2 via ORAL
  Filled 2014-10-22 (×5): qty 2

## 2014-10-22 MED ORDER — PROCHLORPERAZINE MALEATE 10 MG PO TABS: 10.0000 mg | ORAL_TABLET | Freq: Four times a day (QID) | ORAL | Status: DC | PRN

## 2014-10-22 MED ORDER — POLYETHYLENE GLYCOL 3350 17 G PO PACK: 17.0000 g | PACK | Freq: Every day | ORAL | Status: DC

## 2014-10-22 MED ORDER — ONDANSETRON HCL 4 MG PO TABS: 4.0000 mg | ORAL_TABLET | Freq: Four times a day (QID) | ORAL | Status: DC | PRN

## 2014-10-22 MED ORDER — MECLIZINE HCL 12.5 MG PO TABS
12.5000 mg | ORAL_TABLET | Freq: Three times a day (TID) | ORAL | Status: DC | PRN
  Filled 2014-10-22: qty 2

## 2014-10-22 MED ORDER — ENOXAPARIN SODIUM 40 MG/0.4ML ~~LOC~~ SOLN
40.0000 mg | SUBCUTANEOUS | Status: DC
  Administered 2014-10-22 – 2014-10-24 (×3): 40 mg via SUBCUTANEOUS
  Filled 2014-10-22 (×3): qty 0.4

## 2014-10-22 MED ORDER — POLYETHYLENE GLYCOL 3350 17 G PO PACK
17.0000 g | PACK | Freq: Every day | ORAL | Status: DC | PRN
  Filled 2014-10-22 (×2): qty 1

## 2014-10-22 MED ORDER — GABAPENTIN 300 MG PO CAPS
300.0000 mg | ORAL_CAPSULE | Freq: Every day | ORAL | Status: DC
  Administered 2014-10-22 – 2014-10-23 (×3): 300 mg via ORAL
  Filled 2014-10-22 (×4): qty 1

## 2014-10-22 MED ORDER — POLYETHYLENE GLYCOL 3350 17 G PO PACK
17.0000 g | PACK | Freq: Two times a day (BID) | ORAL | Status: DC
  Administered 2014-10-22 – 2014-10-24 (×3): 17 g via ORAL
  Filled 2014-10-22 (×4): qty 1

## 2014-10-22 MED ORDER — ALUM & MAG HYDROXIDE-SIMETH 200-200-20 MG/5ML PO SUSP: 30.0000 mL | Freq: Four times a day (QID) | ORAL | Status: DC | PRN

## 2014-10-22 MED ORDER — NYSTATIN 100000 UNIT/ML MT SUSP
5.0000 mL | Freq: Four times a day (QID) | OROMUCOSAL | Status: DC
  Administered 2014-10-22 – 2014-10-24 (×8): 500000 [IU] via ORAL
  Filled 2014-10-22 (×12): qty 5

## 2014-10-22 MED ORDER — POTASSIUM CHLORIDE 10 MEQ/100ML IV SOLN
10.0000 meq | INTRAVENOUS | Status: AC
  Administered 2014-10-22 (×3): 10 meq via INTRAVENOUS
  Filled 2014-10-22 (×3): qty 100

## 2014-10-22 NOTE — Progress Notes (Signed)
[facility] PROGRESS NOTE  Angela Bond NWG:[id number] DOB: 06/24/1945 DOA: 10/21/2014 PCP: Simona Huh, MD  Assessment/Plan: Neutropenic fever from UTI: Urine and blood cutlures ordered and pending.  Resume IV antibiotics.    Hypokalemia replete as needed.   Breast cancer on chemo follows with Dr Sonny Dandy  Code Status: full code.  Family Communication: none at bedside Disposition Plan: pending.    Consultants:  none  Procedures:  none  Antibiotics:  ceftazidime  HPI/Subjective: Feeling much better today. No new complaints.   Objective: Filed Vitals:   10/22/14 1500  BP: 106/57  Pulse: 85  Temp: 98.9 F (37.2 C)  Resp: 16    Intake/Output Summary (Last 24 hours) at 10/22/14 1843 Last data filed at 10/22/14 1655  Gross per 24 hour  Intake   1130 ml  Output   1575 ml  Net   -445 ml   Filed Weights   10/21/14 2349  Weight: 76.1 kg (167 lb 12.3 oz)    Exam:   General:  Alert afebrile comfortable  Cardiovascular: s1s2  Respiratory: ctab  Abdomen: soft NT ND BS+  Musculoskeletal: no pedal edema.   Data Reviewed: Basic Metabolic Panel:  Recent Labs Lab 10/21/14 0956 10/21/14 1723 10/22/14 0504  NA 138 139 138  K 3.4* 3.1* 3.8  CL  --  101 104  CO2 28 26 26   GLUCOSE 103 102* 90  BUN 14.7 15 9   CREATININE 0.7 0.70 0.64  CALCIUM 8.8 8.5* 8.3*   Liver Function Tests:  Recent Labs Lab 10/21/14 0956 10/21/14 1723  AST 11 12*  ALT 7 11*  ALKPHOS 77 67  BILITOT 0.77 0.9  PROT 6.6 6.6  ALBUMIN 3.1* 3.2*   No results for input(s): LIPASE, AMYLASE in the last 168 hours. No results for input(s): AMMONIA in the last 168 hours. CBC:  Recent Labs Lab 10/21/14 0955 10/21/14 1723 10/22/14 0754  WBC 0.9* 1.0* 1.2*  NEUTROABS 0.1* 0.0*  --   HGB 9.4* 8.6* 7.9*  HCT 29.2* 27.5* 25.0*  MCV 80.9 82.1 80.9  PLT 144* 149* 122*   Cardiac Enzymes: No results for input(s): CKTOTAL, CKMB, CKMBINDEX, TROPONINI in the last 168  hours. BNP (last 3 results) No results for input(s): BNP in the last 8760 hours.  ProBNP (last 3 results) No results for input(s): PROBNP in the last 8760 hours.  CBG: No results for input(s): GLUCAP in the last 168 hours.  Recent Results (from the past 240 hour(s))  MRSA PCR Screening     Status: Abnormal   Collection Time: 10/22/14  2:20 AM  Result Value Ref Range Status   MRSA by PCR POSITIVE (A) NEGATIVE Final    Comment:        The GeneXpert MRSA Assay (FDA approved for NASAL specimens only), is one component of a comprehensive MRSA colonization surveillance program. It is not intended to diagnose MRSA infection nor to guide or monitor treatment for MRSA infections. RESULT CALLED TO, READ BACK BY AND VERIFIED WITH: I SESAY RN @ [phone] ON 10/22/14 BY C DAVIS      Studies: Dg Chest Portable 1 View  10/21/2014   CLINICAL DATA:  Central anterior chest pain today, fever, neutropenia, breast cancer on chemotherapy  EXAM: PORTABLE CHEST - 1 VIEW  COMPARISON:  Portable exam 1712 hours compared to 09/22/2014  FINDINGS: LEFT subclavian power port with tip projecting over SVC.  Normal heart size, mediastinal contours and pulmonary vascularity.  Mild tortuosity of thoracic aorta.  Question mild  subsegmental atelectasis at lung bases.  Lungs otherwise clear.  No definite infiltrate, pleural effusion or pneumothorax.  Bones unremarkable.  IMPRESSION: Question mild bibasilar atelectasis.   Electronically Signed   By: Lavonia Dana M.D.   On: 10/21/2014 17:30    Scheduled Meds: . atorvastatin  10 mg Oral q1800  . cefTAZidime (FORTAZ)  IV  2 g Intravenous 3 times per day  . enoxaparin (LOVENOX) injection  40 mg Subcutaneous Q24H  . FLUoxetine  20 mg Oral Daily  . gabapentin  300 mg Oral QHS  . LORazepam  0.5 mg Oral QHS  . nystatin  5 mL Oral QID  . pantoprazole  40 mg Oral Daily  . polyethylene glycol  17 g Oral BID  . senna-docusate  2 tablet Oral BID  . vancomycin  1,000 mg  Intravenous Q12H   Continuous Infusions:   Principal Problem:   Neutropenia Active Problems:   UTI (lower urinary tract infection)   Hypokalemia    Time spent: 25 min    [facility] Pager [phone]. If 7PM-7AM, please contact night-coverage at [email or web address], password [facility] 10/22/2014, 6:43 PM  LOS: 1 day

## 2014-10-22 NOTE — Telephone Encounter (Signed)
Received telephone advice record from [facility], sent to scan. Patient was seen in ED and is currently admitted at [facility].

## 2014-10-23 LAB — CBC WITH DIFFERENTIAL/PLATELET
BASOS ABS: 0.1 10*3/uL (ref 0.0–0.1)
Basophils Relative: 1 % (ref 0–1)
Eosinophils Absolute: 0.1 10*3/uL (ref 0.0–0.7)
Eosinophils Relative: 2 % (ref 0–5)
HCT: 27.2 % — ABNORMAL LOW (ref 36.0–46.0)
Hemoglobin: 8.3 g/dL — ABNORMAL LOW (ref 12.0–15.0)
Lymphocytes Relative: 26 % (ref 12–46)
Lymphs Abs: 1.3 10*3/uL (ref 0.7–4.0)
MCH: 25.2 pg — ABNORMAL LOW (ref 26.0–34.0)
MCHC: 30.5 g/dL (ref 30.0–36.0)
MCV: 82.7 fL (ref 78.0–100.0)
MONO ABS: 1 10*3/uL (ref 0.1–1.0)
Monocytes Relative: 19 % — ABNORMAL HIGH (ref 3–12)
NEUTROS PCT: 52 % (ref 43–77)
Neutro Abs: 2.6 10*3/uL (ref 1.7–7.7)
Platelets: 179 10*3/uL (ref 150–400)
RBC: 3.29 MIL/uL — AB (ref 3.87–5.11)
RDW: 14.1 % (ref 11.5–15.5)
WBC: 5.1 10*3/uL (ref 4.0–10.5)
nRBC: 2 /100 WBC — ABNORMAL HIGH

## 2014-10-23 MED ORDER — FERROUS SULFATE 325 (65 FE) MG PO TABS
325.0000 mg | ORAL_TABLET | Freq: Two times a day (BID) | ORAL | Status: DC
  Administered 2014-10-23 – 2014-10-24 (×4): 325 mg via ORAL
  Filled 2014-10-23 (×5): qty 1

## 2014-10-23 MED ORDER — SACCHAROMYCES BOULARDII 250 MG PO CAPS
250.0000 mg | ORAL_CAPSULE | Freq: Two times a day (BID) | ORAL | Status: DC
  Administered 2014-10-23 – 2014-10-24 (×3): 250 mg via ORAL
  Filled 2014-10-23 (×4): qty 1

## 2014-10-23 MED ORDER — CHLORHEXIDINE GLUCONATE CLOTH 2 % EX PADS
6.0000 | MEDICATED_PAD | Freq: Every day | CUTANEOUS | Status: DC
  Administered 2014-10-23 – 2014-10-24 (×2): 6 via TOPICAL

## 2014-10-23 MED ORDER — MUPIROCIN 2 % EX OINT
1.0000 "application " | TOPICAL_OINTMENT | Freq: Two times a day (BID) | CUTANEOUS | Status: DC
  Administered 2014-10-23 – 2014-10-24 (×2): 1 via NASAL
  Filled 2014-10-23: qty 22

## 2014-10-23 MED ORDER — FLUCONAZOLE 200 MG PO TABS
200.0000 mg | ORAL_TABLET | Freq: Every day | ORAL | Status: DC
  Administered 2014-10-23 – 2014-10-24 (×2): 200 mg via ORAL
  Filled 2014-10-23 (×2): qty 1

## 2014-10-23 NOTE — Evaluation (Signed)
Physical Therapy Evaluation Patient Details Name: Angela Bond MRN: [id number] DOB: 1945-07-01 Today's Date: 10/23/2014   History of Present Illness  Pt is a 69 year old female with history of hypertension, hyperlipidemia, GERD, anxiety, arthritis, recent posterior lumbar fusion, breast cancer with lumpectomy and initiation of chemotherapy approximately 6 days ago and admitted for neutropenia and UTI  Clinical Impression  Pt admitted with above diagnosis. Pt currently with functional limitations due to the deficits listed below (see PT Problem List).  Pt will benefit from skilled PT to increase their independence and safety with mobility to allow discharge to the venue listed below.  Pt happy to OOB and ambulating.  Pt reports decline in mobility and increased weakness after receiving chemo tx however states she is now starting to feel a little better.   Pt reports her husband is at [facility] and will d/c to SNF however she plans to return home with [facility] services.       Follow Up Recommendations Home health PT    Equipment Recommendations  None recommended by PT    Recommendations for Other Services       Precautions / Restrictions Precautions Precautions: Fall      Mobility  Bed Mobility Overal bed mobility: Needs Assistance Bed Mobility: Supine to Sit     Supine to sit: Supervision;HOB elevated     General bed mobility comments: increased time and effort  Transfers Overall transfer level: Needs assistance Equipment used: Rolling walker (2 wheeled) Transfers: Sit to/from Stand Sit to Stand: Min assist         General transfer comment: slight assist to rise and steady  Ambulation/Gait Ambulation/Gait assistance: Min guard Ambulation Distance (Feet): 200 Feet Assistive device: Rolling walker (2 wheeled) Gait Pattern/deviations: Step-through pattern     General Gait Details: slow pace however performing well with RW  Stairs            Wheelchair Mobility     Modified Rankin (Stroke Patients Only)       Balance Overall balance assessment: History of Falls                                           Pertinent Vitals/Pain Pain Assessment: No/denies pain    Home Living Family/patient expects to be discharged to:: Private residence Living Arrangements: Spouse/significant other Available Help at Discharge: Family;Available 24 hours/day Type of Home: House Home Access: Stairs to enter Entrance Stairs-Rails: Left Entrance Stairs-Number of Steps: 6 Home Layout: One level Home Equipment: Walker - 2 wheels;Cane - single point;Shower seat      Prior Function Level of Independence: Needs assistance   Gait / Transfers Assistance Needed: pt reports becoming weaker and needing to use RW after starting chemo tx     Comments: pt reports her husband is at [facility] currently with plans to d/c to SNF     Hand Dominance        Extremity/Trunk Assessment               Lower Extremity Assessment: Generalized weakness;RLE deficits/detail RLE Deficits / Details: ecchymosis, pt reports hematoma to R knee after fall at cancer center - reports imaging negative for fx       Communication   Communication: No difficulties  Cognition Arousal/Alertness: Awake/alert Behavior During Therapy: WFL for tasks assessed/performed Overall Cognitive Status: Within Functional Limits for tasks assessed  General Comments      Exercises        Assessment/Plan    PT Assessment Patient needs continued PT services  PT Diagnosis Difficulty walking;Generalized weakness   PT Problem List Decreased strength;Decreased activity tolerance;Decreased mobility;Decreased balance  PT Treatment Interventions DME instruction;Gait training;Functional mobility training;Patient/family education;Stair training;Therapeutic activities;Therapeutic exercise;Balance training   PT Goals (Current goals can be found in the Care Plan  section) Acute Rehab PT Goals PT Goal Formulation: With patient Time For Goal Achievement: 10/30/14 Potential to Achieve Goals: Good    Frequency Min 3X/week   Barriers to discharge        Co-evaluation               End of Session   Activity Tolerance: Patient tolerated treatment well Patient left: in chair;with call bell/phone within reach;with chair alarm set           Time: 5953-9672 PT Time Calculation (min) (ACUTE ONLY): 13 min   Charges:   PT Evaluation $Initial PT Evaluation Tier I: 1 Procedure     PT G Codes:        Andrei Mccook,KATHrine E 10/23/2014, 1:04 PM Carmelia Bake, PT, DPT 10/23/2014 Pager: [phone]

## 2014-10-23 NOTE — Progress Notes (Signed)
[facility] PROGRESS NOTE  Angela Bond [name] DOB: 1945-10-20 DOA: 10/21/2014 PCP: Angela Huh, MD  Assessment/Plan: Neutropenic fever from UTI: Urine cultures grew gram negative rods , identification and sensitivities are pending. D/ced vancomycin. blood cutlures ordered and pending.  Resume IV antibiotics.    Hypokalemia replete as needed.   Breast cancer on chemo follows with Dr Angela Bond  Neutropenia resolved.   RLE swelling and tender and a big bruise over the right leg. Get venous duplex to evaluate for DVT.    Code Status: full code.  Family Communication: none at bedside Disposition Plan: possible d/Angela in am.    Consultants:  none  Procedures:  none  Antibiotics:  ceftazidime  HPI/Subjective: Feeling much better today. No new complaints.   Objective: Filed Vitals:   10/23/14 1332  BP: 130/81  Pulse: 86  Temp: 98.5 F (36.9 Angela)  Resp: 18    Intake/Output Summary (Last 24 hours) at 10/23/14 1444 Last data filed at 10/23/14 1332  Gross per 24 hour  Intake    580 ml  Output   2125 ml  Net  -1545 ml   Filed Weights   10/21/14 2349  Weight: 76.1 kg (167 lb 12.3 oz)    Exam:   General:  Alert afebrile comfortable  Cardiovascular: s1s2  Respiratory: ctab  Abdomen: soft NT ND BS+  Musculoskeletal: no pedal edema.   Data Reviewed: Basic Metabolic Panel:  Recent Labs Lab 10/21/14 0956 10/21/14 1723 10/22/14 0504  NA 138 139 138  K 3.4* 3.1* 3.8  CL  --  101 104  CO2 28 26 26   GLUCOSE 103 102* 90  BUN 14.7 15 9   CREATININE 0.7 0.70 0.64  CALCIUM 8.8 8.5* 8.3*   Liver Function Tests:  Recent Labs Lab 10/21/14 0956 10/21/14 1723  AST 11 12*  ALT 7 11*  ALKPHOS 77 67  BILITOT 0.77 0.9  PROT 6.6 6.6  ALBUMIN 3.1* 3.2*   No results for input(s): LIPASE, AMYLASE in the last 168 hours. No results for input(s): AMMONIA in the last 168 hours. CBC:  Recent Labs Lab 10/21/14 0955 10/21/14 1723  10/22/14 0754 10/23/14 1129  WBC 0.9* 1.0* 1.2* 5.1  NEUTROABS 0.1* 0.0*  --  2.6  HGB 9.4* 8.6* 7.9* 8.3*  HCT 29.2* 27.5* 25.0* 27.2*  MCV 80.9 82.1 80.9 82.7  PLT 144* 149* 122* 179   Cardiac Enzymes: No results for input(s): CKTOTAL, CKMB, CKMBINDEX, TROPONINI in the last 168 hours. BNP (last 3 results) No results for input(s): BNP in the last 8760 hours.  ProBNP (last 3 results) No results for input(s): PROBNP in the last 8760 hours.  CBG: No results for input(s): GLUCAP in the last 168 hours.  Recent Results (from the past 240 hour(s))  Urine culture     Status: None (Preliminary result)   Collection Time: 10/21/14  4:54 PM  Result Value Ref Range Status   Specimen Description URINE, CLEAN CATCH  Final   Special Requests NONE  Final   Colony Count   Final    >=100,000 COLONIES/ML Performed at Angela Bond Performed at Angela Bond    Report Status PENDING  Incomplete  Culture, blood (routine x 2)     Status: None (Preliminary result)   Collection Time: 10/21/14  5:23 PM  Result Value Ref Range Status   Specimen Description BLOOD LEFT HAND  Final   Special Requests BOTTLES  DRAWN AEROBIC ONLY 2CC  Final   Culture   Final           BLOOD CULTURE RECEIVED NO GROWTH TO DATE CULTURE WILL BE HELD FOR 5 DAYS BEFORE ISSUING A FINAL NEGATIVE REPORT Performed at Angela Bond    Report Status PENDING  Incomplete  Culture, blood (routine x 2)     Status: None (Preliminary result)   Collection Time: 10/21/14  7:40 PM  Result Value Ref Range Status   Specimen Description BLOOD LEFT ARM  Final   Special Requests BOTTLES DRAWN AEROBIC AND ANAEROBIC 10CC EACH  Final   Culture   Final           BLOOD CULTURE RECEIVED NO GROWTH TO DATE CULTURE WILL BE HELD FOR 5 DAYS BEFORE ISSUING A FINAL NEGATIVE REPORT Performed at Angela Bond    Report Status PENDING  Incomplete  MRSA PCR Screening     Status:  Abnormal   Collection Time: 10/22/14  2:20 AM  Result Value Ref Range Status   MRSA by PCR POSITIVE (A) NEGATIVE Final    Comment:        The GeneXpert MRSA Assay (FDA approved for NASAL specimens only), is one component of a comprehensive MRSA colonization surveillance program. It is not intended to diagnose MRSA infection nor to guide or monitor treatment for MRSA infections. RESULT CALLED TO, READ BACK BY AND VERIFIED WITH: Angela SESAY RN @ [phone] ON 10/22/14 BY Angela Bond      Studies: Dg Chest Portable 1 View  10/21/2014   CLINICAL DATA:  Central anterior chest pain today, fever, neutropenia, breast cancer on chemotherapy  EXAM: PORTABLE CHEST - 1 VIEW  COMPARISON:  Portable exam 1712 hours compared to 09/22/2014  FINDINGS: LEFT subclavian power port with tip projecting over SVC.  Normal heart size, mediastinal contours and pulmonary vascularity.  Mild tortuosity of thoracic aorta.  Question mild subsegmental atelectasis at lung bases.  Lungs otherwise clear.  No definite infiltrate, pleural effusion or pneumothorax.  Bones unremarkable.  IMPRESSION: Question mild bibasilar atelectasis.   Electronically Signed   By: Angela Bond M.D.   On: 10/21/2014 17:30    Scheduled Meds: . atorvastatin  10 mg Oral q1800  . cefTAZidime (FORTAZ)  IV  2 g Intravenous 3 times per day  . Chlorhexidine Gluconate Cloth  6 each Topical Q0600  . enoxaparin (LOVENOX) injection  40 mg Subcutaneous Q24H  . ferrous sulfate  325 mg Oral BID WC  . fluconazole  200 mg Oral Daily  . FLUoxetine  20 mg Oral Daily  . gabapentin  300 mg Oral QHS  . LORazepam  0.5 mg Oral QHS  . mupirocin ointment  1 application Nasal BID  . nystatin  5 mL Oral QID  . pantoprazole  40 mg Oral Daily  . polyethylene glycol  17 g Oral BID  . saccharomyces boulardii  250 mg Oral BID  . senna-docusate  2 tablet Oral BID   Continuous Infusions:   Principal Problem:   Neutropenia Active Problems:   UTI (lower urinary tract  infection)   Hypokalemia    Time spent: 25 min    [facility] Pager [phone]. If 7PM-7AM, please contact night-coverage at [email or web address], password [facility] 10/23/2014, 2:44 PM  LOS: 2 days

## 2014-10-24 ENCOUNTER — Inpatient Hospital Stay (HOSPITAL_COMMUNITY): Payer: Medicare Other

## 2014-10-24 DIAGNOSIS — R609 Edema, unspecified: Secondary | ICD-10-CM

## 2014-10-24 LAB — URINE CULTURE

## 2014-10-24 MED ORDER — HEPARIN SOD (PORK) LOCK FLUSH 100 UNIT/ML IV SOLN
500.0000 [IU] | INTRAVENOUS | Status: AC | PRN
  Administered 2014-10-24: 500 [IU]
  Filled 2014-10-24: qty 5

## 2014-10-24 MED ORDER — CEPHALEXIN 500 MG PO CAPS: 500.0000 mg | ORAL_CAPSULE | Freq: Four times a day (QID) | ORAL | Status: DC

## 2014-10-24 MED ORDER — FLUCONAZOLE 200 MG PO TABS: 200.0000 mg | ORAL_TABLET | Freq: Every day | ORAL | Status: DC

## 2014-10-24 MED ORDER — SACCHAROMYCES BOULARDII 250 MG PO CAPS: 250.0000 mg | ORAL_CAPSULE | Freq: Two times a day (BID) | ORAL | Status: DC

## 2014-10-24 MED ORDER — FERROUS SULFATE 325 (65 FE) MG PO TABS: 325.0000 mg | ORAL_TABLET | Freq: Two times a day (BID) | ORAL | Status: DC

## 2014-10-24 NOTE — Progress Notes (Signed)
*  PRELIMINARY RESULTS* Vascular Ultrasound Right lower extremity venous duplex has been completed.  Preliminary findings: negative for DVT  Landry Mellow, RDMS, RVT  10/24/2014, 9:16 AM

## 2014-10-24 NOTE — Discharge Summary (Signed)
Physician Discharge Summary  Angela Bond MVH:[id number] DOB: 05/24/1945 DOA: 10/21/2014  PCP: Simona Huh, MD  Admit date: 10/21/2014 Discharge date: 10/24/2014  Time spent: 30 minutes  Recommendations for Outpatient Follow-up:  1. Follow up with oncology and PCP as recommended in 1 to 2 weeks  Discharge Diagnoses:  Principal Problem:   Neutropenia Active Problems:   UTI (lower urinary tract infection)   Hypokalemia   Discharge Condition: improved  Diet recommendation: regular.  Filed Weights   10/21/14 2349  Weight: 76.1 kg (167 lb 12.3 oz)    History of present illness:  Angela Bond is a 69 y.o. female has a history of hypertension, hyperlipidemia, GERD, anxiety, arthritis , breast cancer with lumpectomy and initiation of chemotherapy approximately 6 days ago with Taxotere Cytoxan, came in for fever.   Hospital Course:  Neutropenic fever from UTI: Urine cultures grew klebsiella and sensitive to keflex. She will be discharged on keflex to complete course. blood cutlures ordered and negative so far.     Hypokalemia replete as needed.   Breast cancer on chemo follows with Dr Sonny Dandy  Neutropenia resolved.   RLE swelling and tender and a big bruise over the right leg. Get venous duplex negative for DVT.   Procedures:  none  Consultations:  none  Discharge Exam: Filed Vitals:   10/24/14 0609  BP: 122/57  Pulse: 71  Temp: 98.2 F (36.8 C)  Resp: 16    General: alert afebrile comfortable Cardiovascular: s1s2 Respiratory: ctab  Discharge Instructions   Discharge Instructions    Discharge instructions    Complete by:  As directed   Follow up with PCP in one week.          Current Discharge Medication List    START taking these medications   Details  cephALEXin (KEFLEX) 500 MG capsule Take 1 capsule (500 mg total) by mouth 4 (four) times daily. Qty: 10 capsule, Refills: 0    ferrous sulfate 325 (65 FE) MG tablet Take 1 tablet (325  mg total) by mouth 2 (two) times daily with a meal. Qty: 30 tablet, Refills: 3    fluconazole (DIFLUCAN) 200 MG tablet Take 1 tablet (200 mg total) by mouth daily. Qty: 5 tablet, Refills: 0    saccharomyces boulardii (FLORASTOR) 250 MG capsule Take 1 capsule (250 mg total) by mouth 2 (two) times daily. Qty: 30 capsule, Refills: 0      CONTINUE these medications which have NOT CHANGED   Details  calcium carbonate (OS-CAL) 600 MG TABS Take 600 mg by mouth 2 (two) times daily with a meal.    carboxymethylcellulose (REFRESH PLUS) 0.5 % SOLN 1 drop 4 (four) times daily.     Cholecalciferol (VITAMIN D) 2000 UNITS tablet Take 2,000 Units by mouth 2 (two) times daily.     dexamethasone (DECADRON) 4 MG tablet Take 1 tablet (4 mg total) by mouth 2 (two) times daily. Start the day before Taxotere. Then again the day after chemo for 3 days. Qty: 30 tablet, Refills: 1   Associated Diagnoses: Breast cancer of upper-outer quadrant of right female breast    dexlansoprazole (DEXILANT) 60 MG capsule Take 60 mg by mouth daily.    fexofenadine (ALLEGRA) 180 MG tablet Take 180 mg by mouth daily.    FLUoxetine (PROZAC) 20 MG capsule Take 20 mg by mouth daily. Refills: 11    gabapentin (NEURONTIN) 300 MG capsule Take 300 mg by mouth at bedtime.     lidocaine-prilocaine (EMLA) cream Apply to  affected area once Qty: 30 g, Refills: 3   Associated Diagnoses: Breast cancer of upper-outer quadrant of right female breast    LORazepam (ATIVAN) 0.5 MG tablet Take 1 tablet (0.5 mg total) by mouth at bedtime. Qty: 30 tablet, Refills: 0   Associated Diagnoses: Breast cancer of upper-outer quadrant of right female breast    lovastatin (MEVACOR) 40 MG tablet Take 40 mg by mouth daily before breakfast.     meclizine (ANTIVERT) 25 MG tablet Take 12.5-25 mg by mouth 3 (three) times daily as needed for dizziness.    meloxicam (MOBIC) 15 MG tablet Take 15 mg by mouth daily.     ondansetron (ZOFRAN) 8 MG tablet  Take 1 tablet (8 mg total) by mouth 2 (two) times daily. Start the day after chemo for 3 days. Then take as needed for nausea or vomiting. Qty: 30 tablet, Refills: 1   Associated Diagnoses: Breast cancer of upper-outer quadrant of right female breast    oxyCODONE-acetaminophen (ROXICET) 5-325 MG per tablet Take 1-2 tablets by mouth every 4 (four) hours as needed. Qty: 30 tablet, Refills: 0    polyethylene glycol (MIRALAX / GLYCOLAX) packet Take 17 g by mouth as needed for mild constipation.     PRESCRIPTION MEDICATION Chemo; [place].    prochlorperazine (COMPAZINE) 10 MG tablet Take 1 tablet (10 mg total) by mouth every 6 (six) hours as needed (Nausea or vomiting). Qty: 30 tablet, Refills: 1   Associated Diagnoses: Breast cancer of upper-outer quadrant of right female breast    sodium chloride (OCEAN) 0.65 % SOLN nasal spray Place 2 sprays into both nostrils as needed for congestion.    nystatin (MYCOSTATIN) 100000 UNIT/ML suspension Take 5 mLs (500,000 Units total) by mouth 4 (four) times daily. Qty: 60 mL, Refills: 0   Associated Diagnoses: Breast cancer of upper-outer quadrant of right female breast       Allergies  Allergen Reactions  . Shellfish Allergy Anaphylaxis    ANAPHYLAXIS TO OYSTERS  . Lactose Intolerance (Gi) Other (See Comments)  . Aleve [Naproxen] Nausea And Vomiting  . Aspirin Other (See Comments)    Burns stomach  . Ciprofloxacin Nausea And Vomiting  . Doxycycline Nausea And Vomiting  . Evista [Raloxifene] Other (See Comments)    Leg aches  . Fosamax [Alendronate Sodium] Other (See Comments)    Leg aches  . Pravachol [Pravastatin] Other (See Comments)    GI upset   . Sulfa Antibiotics Nausea Only  . Welchol [Colesevelam Hcl] Other (See Comments)    Stomach cramps  . Zocor [Simvastatin] Rash   Follow-up Information    Follow up with Simona Huh, MD. Schedule an appointment as soon as possible for a visit in 1 week.   Specialty:  Family Medicine    Contact information:   [address] [place] [address] [phone]        The results of significant diagnostics from this hospitalization (including imaging, microbiology, ancillary and laboratory) are listed below for reference.    Significant Diagnostic Studies: Dg Ankle Complete Right  10/15/2014   CLINICAL DATA:  Medial an anterior right ankle pain after falling today while walking to the cancer center.  EXAM: RIGHT ANKLE - COMPLETE 3+ VIEW  COMPARISON:  Right foot radiographs dated 08/13/2013.  FINDINGS: Posterior and inferior calcaneal spurs with minimal progression. Diffuse lateral soft tissue swelling. No fracture, dislocation or effusion seen. Metatarsal/tarsal degenerative changes.  IMPRESSION: 1. No fracture or effusion. 2. Degenerative changes.   Electronically  Signed   By: Claudie Revering M.D.   On: 10/15/2014 15:21   Dg Chest Portable 1 View  10/21/2014   CLINICAL DATA:  Central anterior chest pain today, fever, neutropenia, breast cancer on chemotherapy  EXAM: PORTABLE CHEST - 1 VIEW  COMPARISON:  Portable exam 1712 hours compared to 09/22/2014  FINDINGS: LEFT subclavian power port with tip projecting over SVC.  Normal heart size, mediastinal contours and pulmonary vascularity.  Mild tortuosity of thoracic aorta.  Question mild subsegmental atelectasis at lung bases.  Lungs otherwise clear.  No definite infiltrate, pleural effusion or pneumothorax.  Bones unremarkable.  IMPRESSION: Question mild bibasilar atelectasis.   Electronically Signed   By: Lavonia Dana M.D.   On: 10/21/2014 17:30   Dg Knee Complete 4 Views Right  10/15/2014   CLINICAL DATA:  Fall walking into building. Right knee pain and bruising. Initial encounter.  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 No evidence of fracture or dislocation. No evidence of hardware failure or loosening. No evidence of knee joint effusion.  IMPRESSION:  Total knee arthroplasty.  No acute findings.   Electronically Signed   By: Earle Gell M.D.   On: 10/15/2014 15:22    Microbiology: Recent Results (from the past 240 hour(s))  Urine culture     Status: None   Collection Time: 10/21/14  4:54 PM  Result Value Ref Range Status   Specimen Description URINE, CLEAN CATCH  Final   Special Requests NONE  Final   Colony Count   Final    >=100,000 COLONIES/ML Performed at [facility]    Culture   Final    KLEBSIELLA PNEUMONIAE Performed at [facility]    Report Status 10/24/2014 FINAL  Final   Organism ID, Bacteria KLEBSIELLA PNEUMONIAE  Final      Susceptibility   Klebsiella pneumoniae - MIC*    AMPICILLIN >=32 RESISTANT Resistant     CEFAZOLIN <=4 SENSITIVE Sensitive     CEFTRIAXONE <=1 SENSITIVE Sensitive     CIPROFLOXACIN <=0.25 SENSITIVE Sensitive     GENTAMICIN <=1 SENSITIVE Sensitive     LEVOFLOXACIN <=0.12 SENSITIVE Sensitive     NITROFURANTOIN 64 INTERMEDIATE Intermediate     TOBRAMYCIN <=1 SENSITIVE Sensitive     TRIMETH/SULFA <=20 SENSITIVE Sensitive     PIP/TAZO <=4 SENSITIVE Sensitive     * KLEBSIELLA PNEUMONIAE  Culture, blood (routine x 2)     Status: None (Preliminary result)   Collection Time: 10/21/14  5:23 PM  Result Value Ref Range Status   Specimen Description BLOOD LEFT HAND  Final   Special Requests BOTTLES DRAWN AEROBIC ONLY 2CC  Final   Culture   Final           BLOOD CULTURE RECEIVED NO GROWTH TO DATE CULTURE WILL BE HELD FOR 5 DAYS BEFORE ISSUING A FINAL NEGATIVE REPORT Performed at [facility]    Report Status PENDING  Incomplete  Culture, blood (routine x 2)     Status: None (Preliminary result)   Collection Time: 10/21/14  7:40 PM  Result Value Ref Range Status   Specimen Description BLOOD LEFT ARM  Final   Special Requests BOTTLES DRAWN AEROBIC AND ANAEROBIC 10CC EACH  Final   Culture   Final           BLOOD CULTURE RECEIVED NO GROWTH TO DATE CULTURE WILL BE HELD FOR 5  DAYS BEFORE ISSUING A FINAL NEGATIVE  REPORT Performed at [facility]    Report Status PENDING  Incomplete  MRSA PCR Screening     Status: Abnormal   Collection Time: 10/22/14  2:20 AM  Result Value Ref Range Status   MRSA by PCR POSITIVE (A) NEGATIVE Final    Comment:        The GeneXpert MRSA Assay (FDA approved for NASAL specimens only), is one component of a comprehensive MRSA colonization surveillance program. It is not intended to diagnose MRSA infection nor to guide or monitor treatment for MRSA infections. RESULT CALLED TO, READ BACK BY AND VERIFIED WITH: I SESAY RN @ [phone] ON 10/22/14 BY C DAVIS      Labs: Basic Metabolic Panel:  Recent Labs Lab 10/21/14 0956 10/21/14 1723 10/22/14 0504  NA 138 139 138  K 3.4* 3.1* 3.8  CL  --  101 104  CO2 28 26 26   GLUCOSE 103 102* 90  BUN 14.7 15 9   CREATININE 0.7 0.70 0.64  CALCIUM 8.8 8.5* 8.3*   Liver Function Tests:  Recent Labs Lab 10/21/14 0956 10/21/14 1723  AST 11 12*  ALT 7 11*  ALKPHOS 77 67  BILITOT 0.77 0.9  PROT 6.6 6.6  ALBUMIN 3.1* 3.2*   No results for input(s): LIPASE, AMYLASE in the last 168 hours. No results for input(s): AMMONIA in the last 168 hours. CBC:  Recent Labs Lab 10/21/14 0955 10/21/14 1723 10/22/14 0754 10/23/14 1129  WBC 0.9* 1.0* 1.2* 5.1  NEUTROABS 0.1* 0.0*  --  2.6  HGB 9.4* 8.6* 7.9* 8.3*  HCT 29.2* 27.5* 25.0* 27.2*  MCV 80.9 82.1 80.9 82.7  PLT 144* 149* 122* 179   Cardiac Enzymes: No results for input(s): CKTOTAL, CKMB, CKMBINDEX, TROPONINI in the last 168 hours. BNP: BNP (last 3 results) No results for input(s): BNP in the last 8760 hours.  ProBNP (last 3 results) No results for input(s): PROBNP in the last 8760 hours.  CBG: No results for input(s): GLUCAP in the last 168 hours.     SignedHosie Poisson  [facility] 10/24/2014, 10:53 AM

## 2014-10-24 NOTE — Care Management Note (Signed)
Case Management Note  Patient Details  Name: Angela Bond MRN: [id number] Date of Birth: Apr 11, 1946  Subjective/Objective:          69 yo admitted with Neutropenia          Action/Plan: From home with husband  Expected Discharge Date:   (unknown)               Expected Discharge Plan:  Home/Self Care  In-House Referral:     Discharge planning Services  CM Consult  Post Acute Care Choice:    Choice offered to:     DME Arranged:    DME Agency:     HH Arranged:    HH Agency:     Status of Service:  In process, will continue to follow  Medicare Important Message Given:  Yes Date Medicare IM Given:  10/24/14 Medicare IM give by:  Marney Doctor RN,BSN,NCM Date Additional Medicare IM Given:    Additional Medicare Important Message give by:     If discussed at [place] of Stay Meetings, dates discussed:    Additional Comments: Please see EDCM note.  Spoke with pt at bedside to confirm that she wanted to use [place] for HHPT services. Pt states that she is not worried about that at this time. She states that her husband just got put into a SNF and she is probably going to stay with her sister. Pt states she gets around ok with her walker and if she decides to do HHPT at a later date she will call her PCP. [facility] service provider list given to her.  CM will sign off. Lynnell Catalan, RN 10/24/2014, 9:52 AM

## 2014-10-28 LAB — CULTURE, BLOOD (ROUTINE X 2)
CULTURE: NO GROWTH
Culture: NO GROWTH

## 2014-10-31 ENCOUNTER — Telehealth: Payer: Self-pay

## 2014-10-31 NOTE — Telephone Encounter (Signed)
Lab results dtd 10/30/14 rcvd by fax from Ethel at [place].  Reviewed by Dr. Lindi Adie.  Sent to scan.

## 2014-11-04 ENCOUNTER — Ambulatory Visit (HOSPITAL_BASED_OUTPATIENT_CLINIC_OR_DEPARTMENT_OTHER): Payer: Medicare Other

## 2014-11-04 ENCOUNTER — Encounter: Payer: Self-pay | Admitting: *Deleted

## 2014-11-04 ENCOUNTER — Other Ambulatory Visit: Payer: Self-pay | Admitting: *Deleted

## 2014-11-04 ENCOUNTER — Other Ambulatory Visit (HOSPITAL_BASED_OUTPATIENT_CLINIC_OR_DEPARTMENT_OTHER): Payer: Medicare Other

## 2014-11-04 ENCOUNTER — Ambulatory Visit (HOSPITAL_BASED_OUTPATIENT_CLINIC_OR_DEPARTMENT_OTHER): Payer: Medicare Other | Admitting: Hematology and Oncology

## 2014-11-04 ENCOUNTER — Ambulatory Visit: Payer: Medicare Other

## 2014-11-04 ENCOUNTER — Telehealth: Payer: Self-pay | Admitting: Hematology and Oncology

## 2014-11-04 VITALS — BP 158/77 | HR 70

## 2014-11-04 VITALS — BP 174/88 | HR 94 | Temp 98.6°F | Resp 20 | Ht 60.0 in | Wt 170.1 lb

## 2014-11-04 DIAGNOSIS — C50411 Malignant neoplasm of upper-outer quadrant of right female breast: Secondary | ICD-10-CM

## 2014-11-04 DIAGNOSIS — D701 Agranulocytosis secondary to cancer chemotherapy: Secondary | ICD-10-CM

## 2014-11-04 DIAGNOSIS — N390 Urinary tract infection, site not specified: Secondary | ICD-10-CM | POA: Diagnosis not present

## 2014-11-04 DIAGNOSIS — Z171 Estrogen receptor negative status [ER-]: Secondary | ICD-10-CM | POA: Diagnosis not present

## 2014-11-04 DIAGNOSIS — Z5111 Encounter for antineoplastic chemotherapy: Secondary | ICD-10-CM | POA: Diagnosis not present

## 2014-11-04 DIAGNOSIS — Z95828 Presence of other vascular implants and grafts: Secondary | ICD-10-CM

## 2014-11-04 LAB — CBC WITH DIFFERENTIAL/PLATELET
BASO%: 0.3 % (ref 0.0–2.0)
BASOS ABS: 0 10*3/uL (ref 0.0–0.1)
EOS%: 0 % (ref 0.0–7.0)
Eosinophils Absolute: 0 10*3/uL (ref 0.0–0.5)
HCT: 28.6 % — ABNORMAL LOW (ref 34.8–46.6)
HEMOGLOBIN: 9.3 g/dL — AB (ref 11.6–15.9)
LYMPH#: 0.8 10*3/uL — AB (ref 0.9–3.3)
LYMPH%: 11.1 % — ABNORMAL LOW (ref 14.0–49.7)
MCH: 26.4 pg (ref 25.1–34.0)
MCHC: 32.5 g/dL (ref 31.5–36.0)
MCV: 81.3 fL (ref 79.5–101.0)
MONO#: 0.5 10*3/uL (ref 0.1–0.9)
MONO%: 7.3 % (ref 0.0–14.0)
NEUT#: 5.9 10*3/uL (ref 1.5–6.5)
NEUT%: 81.3 % — ABNORMAL HIGH (ref 38.4–76.8)
Platelets: 351 10*3/uL (ref 145–400)
RBC: 3.53 10*6/uL — AB (ref 3.70–5.45)
RDW: 16.8 % — AB (ref 11.2–14.5)
WBC: 7.3 10*3/uL (ref 3.9–10.3)

## 2014-11-04 LAB — COMPREHENSIVE METABOLIC PANEL (CC13)
ALBUMIN: 3.5 g/dL (ref 3.5–5.0)
ALT: 12 U/L (ref 0–55)
AST: 12 U/L (ref 5–34)
Alkaline Phosphatase: 66 U/L (ref 40–150)
Anion Gap: 9 mEq/L (ref 3–11)
BUN: 14.3 mg/dL (ref 7.0–26.0)
CALCIUM: 9.4 mg/dL (ref 8.4–10.4)
CHLORIDE: 101 meq/L (ref 98–109)
CO2: 27 mEq/L (ref 22–29)
Creatinine: 0.9 mg/dL (ref 0.6–1.1)
EGFR: 67 mL/min/{1.73_m2} — ABNORMAL LOW (ref >90)
Glucose: 134 mg/dl (ref 70–140)
POTASSIUM: 3.9 meq/L (ref 3.5–5.1)
Sodium: 136 mEq/L (ref 136–145)
Total Bilirubin: 0.3 mg/dL (ref 0.20–1.20)
Total Protein: 7 g/dL (ref 6.4–8.3)

## 2014-11-04 MED ORDER — SODIUM CHLORIDE 0.9 % IJ SOLN
10.0000 mL | INTRAMUSCULAR | Status: DC | PRN
  Administered 2014-11-04: 10 mL via INTRAVENOUS
  Filled 2014-11-04: qty 10

## 2014-11-04 MED ORDER — SODIUM CHLORIDE 0.9 % IJ SOLN
10.0000 mL | INTRAMUSCULAR | Status: DC | PRN
  Administered 2014-11-04: 10 mL
  Filled 2014-11-04: qty 10

## 2014-11-04 MED ORDER — SODIUM CHLORIDE 0.9 % IV SOLN: Freq: Once | INTRAVENOUS | Status: DC

## 2014-11-04 MED ORDER — PALONOSETRON HCL INJECTION 0.25 MG/5ML
0.2500 mg | Freq: Once | INTRAVENOUS | Status: AC
  Administered 2014-11-04: 0.25 mg via INTRAVENOUS

## 2014-11-04 MED ORDER — LORAZEPAM 0.5 MG PO TABS: 0.5000 mg | ORAL_TABLET | Freq: Every day | ORAL | Status: DC

## 2014-11-04 MED ORDER — PALONOSETRON HCL INJECTION 0.25 MG/5ML
INTRAVENOUS | Status: AC
  Filled 2014-11-04: qty 5

## 2014-11-04 MED ORDER — DOCETAXEL CHEMO INJECTION 160 MG/16ML
60.0000 mg/m2 | Freq: Once | INTRAVENOUS | Status: AC
  Administered 2014-11-04: 110 mg via INTRAVENOUS
  Filled 2014-11-04: qty 11

## 2014-11-04 MED ORDER — SODIUM CHLORIDE 0.9 % IV SOLN
500.0000 mg/m2 | Freq: Once | INTRAVENOUS | Status: AC
  Administered 2014-11-04: 900 mg via INTRAVENOUS
  Filled 2014-11-04: qty 45

## 2014-11-04 MED ORDER — HEPARIN SOD (PORK) LOCK FLUSH 100 UNIT/ML IV SOLN
500.0000 [IU] | Freq: Once | INTRAVENOUS | Status: AC | PRN
  Administered 2014-11-04: 500 [IU]
  Filled 2014-11-04: qty 5

## 2014-11-04 MED ORDER — SODIUM CHLORIDE 0.9 % IV SOLN
Freq: Once | INTRAVENOUS | Status: AC
  Administered 2014-11-04: 14:00:00 via INTRAVENOUS

## 2014-11-04 MED ORDER — PEGFILGRASTIM 6 MG/0.6ML ~~LOC~~ PSKT
6.0000 mg | PREFILLED_SYRINGE | Freq: Once | SUBCUTANEOUS | Status: AC
  Administered 2014-11-04: 6 mg via SUBCUTANEOUS
  Filled 2014-11-04: qty 0.6

## 2014-11-04 MED ORDER — SODIUM CHLORIDE 0.9 % IV SOLN
Freq: Once | INTRAVENOUS | Status: AC
  Administered 2014-11-04: 14:00:00 via INTRAVENOUS
  Filled 2014-11-04: qty 5

## 2014-11-04 NOTE — Patient Instructions (Addendum)
[facility] Discharge Instructions for Patients Receiving Chemotherapy  Today you received the following chemotherapy agents: Taxotere, Cytoxan   To help prevent nausea and vomiting after your treatment, we encourage you to take your nausea medication as directed.    If you develop nausea and vomiting that is not controlled by your nausea medication, call the clinic.   BELOW ARE SYMPTOMS THAT SHOULD BE REPORTED IMMEDIATELY:  *FEVER GREATER THAN 100.5 F  *CHILLS WITH OR WITHOUT FEVER  NAUSEA AND VOMITING THAT IS NOT CONTROLLED WITH YOUR NAUSEA MEDICATION  *UNUSUAL SHORTNESS OF BREATH  *UNUSUAL BRUISING OR BLEEDING  TENDERNESS IN MOUTH AND THROAT WITH OR WITHOUT PRESENCE OF ULCERS  *URINARY PROBLEMS  *BOWEL PROBLEMS  UNUSUAL RASH Items with * indicate a potential emergency and should be followed up as soon as possible.  Feel free to call the clinic you have any questions or concerns. The clinic phone number is [phone].  Please show the CHEMO ALERT CARD at check-in to the Emergency Department and triage nurse.   

## 2014-11-04 NOTE — Assessment & Plan Note (Addendum)
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 Adjuvant chemo with TC X 4 (I chose his regimen because patient appears to be slightly older than her stated age and I'm concerned about toxicity to standard full dose chemotherapy with Adriamycin and Cytoxan followed by Taxol. In addition patient is also very reluctant to consider aggressive chemotherapy because she felt that her sister died because of toxicities of chemotherapy.) 2. Adjuvant XRT  Current treatment: Cycle 2 day 1 (started cycle 1 on 10/15/2014) of Taxotere Cytoxan Chemotherapy toxicities: 1. Oral thrush: Nystatin swish and swallow resolved 2. Clinical dehydration: After cycle 1 required IV fluids 3. Severe fatigue: Decreased the dosage of chemotherapy for cycle 2 4. Neutropenic fever: After cycle 1 of chemotherapy hospitalization with Klebsiella urinary tract infection treated with Keflex  Anemia: Iron studies show 4% iron saturation with a ferritin of 291 which could be an acute phase reactant. Recent knee injury: Doing better today  Return to clinic in 3 weeks for cycle 3

## 2014-11-04 NOTE — Telephone Encounter (Signed)
Gave calendar for June

## 2014-11-04 NOTE — Progress Notes (Signed)
Received lab results from [facility], sent to scan.

## 2014-11-04 NOTE — Patient Instructions (Signed)

## 2014-11-04 NOTE — Progress Notes (Signed)
Patient Care Team: Gaynelle Arabian, MD as PCP - General (Family Medicine) Jerline Pain, MD as Consulting Physician (Cardiology) Wonda Horner, MD as Consulting Physician (Gastroenterology) Ralene Ok, MD as Consulting Physician (Surgery)  DIAGNOSIS: No matching staging information was found for the patient.  SUMMARY OF ONCOLOGIC HISTORY:   Breast cancer of upper-outer quadrant of right female breast   07/30/2014 Mammogram Right breast 10 o'clock location 4 cm from the nipple measuring 1.7 x 1.3 x 1.3 cm. This corresponds to the palpable and mammographic finding. In the right axilla, there are several lymph nodes which are normal in size measuring up to 67mm.   08/27/2014 Initial Diagnosis Right Breast 10:00: IDC with LVI, Grade 3, Er 0%, PR 0%, Her 2 Neg, Ki 67: 30%   09/22/2014 Surgery Rt Lumpectomy: IDC grade 3, Size 2.6 cm, with HG DCIS, LVI present, ER/PR: 0%; 1/2 LN Positive: T2N1M0 Stage 2B   10/15/2014 -  Chemotherapy Adjuvant chemotherapy with Taxotere Cytoxan 4 q3 weeks   10/21/2014 - 10/24/2014 Hospital Admission Admission for neutropenic fever UTI Klebsiella    CHIEF COMPLIANT: Cycle 2 Taxotere and Cytoxan  INTERVAL HISTORY: Angela Bond is a scenario the above-mentioned history of right-sided breast cancer currently on adjuvant chemotherapy. After cycle once was hospitalized with neutropenic fever and sepsis and was treated with antibiotics. She saw her primary care physician who rechecked her urine analysis and put her on another antibiotic because she had persistent UTI. She feels much stronger. Her husband is in the nursing home awaiting gallbladder surgery. Because of multiple issues, she is very stressed out and anxious today. She is requesting a refill of Ativan.  REVIEW OF SYSTEMS:   Constitutional: Denies fevers, chills or abnormal weight loss Eyes: Denies blurriness of vision Ears, nose, mouth, throat, and face: Denies mucositis or sore throat Respiratory: Denies cough,  dyspnea or wheezes Cardiovascular: Denies palpitation, chest discomfort or lower extremity swelling Gastrointestinal:  Denies nausea, heartburn or change in bowel habits, urinary tract infection Skin: Denies abnormal skin rashes Lymphatics: Denies new lymphadenopathy or easy bruising Neurological:Denies numbness, tingling or new weaknesses Behavioral/Psych: Anxiety and stress  All other systems were reviewed with the patient and are negative.  I have reviewed the past medical history, past surgical history, social history and family history with the patient and they are unchanged from previous note.  ALLERGIES:  is allergic to shellfish allergy; lactose intolerance (gi); aleve; aspirin; ciprofloxacin; doxycycline; evista; fosamax; pravachol; sulfa antibiotics; welchol; and zocor.  MEDICATIONS:  Current Outpatient Prescriptions  Medication Sig Dispense Refill  . calcium carbonate (OS-CAL) 600 MG TABS Take 600 mg by mouth 2 (two) times daily with a meal.    . carboxymethylcellulose (REFRESH PLUS) 0.5 % SOLN 1 drop 4 (four) times daily.     . Cholecalciferol (VITAMIN D) 2000 UNITS tablet Take 2,000 Units by mouth 2 (two) times daily.     Marland Kitchen dexamethasone (DECADRON) 4 MG tablet Take 1 tablet (4 mg total) by mouth 2 (two) times daily. Start the day before Taxotere. Then again the day after chemo for 3 days. 30 tablet 1  . dexlansoprazole (DEXILANT) 60 MG capsule Take 60 mg by mouth daily.    . ferrous sulfate 325 (65 FE) MG tablet Take 1 tablet (325 mg total) by mouth 2 (two) times daily with a meal. 30 tablet 3  . fexofenadine (ALLEGRA) 180 MG tablet Take 180 mg by mouth daily.    . fluconazole (DIFLUCAN) 200 MG tablet Take 1 tablet (200  mg total) by mouth daily. 5 tablet 0  . FLUoxetine (PROZAC) 20 MG capsule Take 20 mg by mouth daily.  11  . gabapentin (NEURONTIN) 300 MG capsule Take 300 mg by mouth at bedtime.     . lidocaine-prilocaine (EMLA) cream Apply to affected area once 30 g 3  .  lovastatin (MEVACOR) 40 MG tablet Take 40 mg by mouth daily before breakfast.     . meclizine (ANTIVERT) 25 MG tablet Take 12.5-25 mg by mouth 3 (three) times daily as needed for dizziness.    . meloxicam (MOBIC) 15 MG tablet Take 15 mg by mouth daily.     Marland Kitchen nystatin (MYCOSTATIN) 100000 UNIT/ML suspension Take 5 mLs (500,000 Units total) by mouth 4 (four) times daily. 60 mL 0  . ondansetron (ZOFRAN) 8 MG tablet Take 1 tablet (8 mg total) by mouth 2 (two) times daily. Start the day after chemo for 3 days. Then take as needed for nausea or vomiting. 30 tablet 1  . oxyCODONE-acetaminophen (ROXICET) 5-325 MG per tablet Take 1-2 tablets by mouth every 4 (four) hours as needed. 30 tablet 0  . polyethylene glycol (MIRALAX / GLYCOLAX) packet Take 17 g by mouth as needed for mild constipation.     Marland Kitchen PRESCRIPTION MEDICATION Chemo; [place].    Marland Kitchen prochlorperazine (COMPAZINE) 10 MG tablet Take 1 tablet (10 mg total) by mouth every 6 (six) hours as needed (Nausea or vomiting). 30 tablet 1  . saccharomyces boulardii (FLORASTOR) 250 MG capsule Take 1 capsule (250 mg total) by mouth 2 (two) times daily. 30 capsule 0  . sodium chloride (OCEAN) 0.65 % SOLN nasal spray Place 2 sprays into both nostrils as needed for congestion.    Marland Kitchen LORazepam (ATIVAN) 0.5 MG tablet Take 1 tablet (0.5 mg total) by mouth at bedtime. 30 tablet 0   No current facility-administered medications for this visit.    PHYSICAL EXAMINATION: ECOG PERFORMANCE STATUS: 1 - Symptomatic but completely ambulatory  Filed Vitals:   11/04/14 1321  BP: 174/88  Pulse: 94  Temp: 98.6 F (37 C)  Resp: 20   Filed Weights   11/04/14 1321  Weight: 170 lb 1.6 oz (77.157 kg)    GENERAL:alert, no distress and comfortable SKIN: skin color, texture, turgor are normal, no rashes or significant lesions EYES: normal, Conjunctiva are pink and non-injected, sclera clear OROPHARYNX:no exudate, no erythema and lips, buccal mucosa, and tongue normal  NECK:  supple, thyroid normal size, non-tender, without nodularity LYMPH:  no palpable lymphadenopathy in the cervical, axillary or inguinal LUNGS: clear to auscultation and percussion with normal breathing effort HEART: regular rate & rhythm and no murmurs and no lower extremity edema ABDOMEN:abdomen soft, non-tender and normal bowel sounds Musculoskeletal:no cyanosis of digits and no clubbing  NEURO: alert & oriented x 3 with fluent speech, no focal motor/sensory deficits   LABORATORY DATA:  I have reviewed the data as listed   Chemistry      Component Value Date/Time   NA 136 11/04/2014 1231   NA 138 10/22/2014 0504   K 3.9 11/04/2014 1231   K 3.8 10/22/2014 0504   CL 104 10/22/2014 0504   CO2 27 11/04/2014 1231   CO2 26 10/22/2014 0504   BUN 14.3 11/04/2014 1231   BUN 9 10/22/2014 0504   CREATININE 0.9 11/04/2014 1231   CREATININE 0.64 10/22/2014 0504      Component Value Date/Time   CALCIUM 9.4 11/04/2014 1231   CALCIUM 8.3* 10/22/2014 0504   ALKPHOS 66 11/04/2014  1231   ALKPHOS 67 10/21/2014 1723   AST 12 11/04/2014 1231   AST 12* 10/21/2014 1723   ALT 12 11/04/2014 1231   ALT 11* 10/21/2014 1723   BILITOT 0.30 11/04/2014 1231   BILITOT 0.9 10/21/2014 1723       Lab Results  Component Value Date   WBC 7.3 11/04/2014   HGB 9.3* 11/04/2014   HCT 28.6* 11/04/2014   MCV 81.3 11/04/2014   PLT 351 11/04/2014   NEUTROABS 5.9 11/04/2014    ASSESSMENT & PLAN:  Breast cancer of upper-outer quadrant of right female breast Rt Lumpectomy: IDC grade 3, 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 Adjuvant chemo with TC X 4 (I chose his regimen because patient appears to be slightly older than her stated age and I'm concerned about toxicity to standard full dose chemotherapy with Adriamycin and Cytoxan followed by Taxol. In addition patient is also very reluctant to consider aggressive  chemotherapy because she felt that her sister died because of toxicities of chemotherapy.) 2. Adjuvant XRT  Current treatment: Cycle 2 day 1 (started cycle 1 on 10/15/2014) of Taxotere Cytoxan Chemotherapy toxicities: 1. Oral thrush: Nystatin swish and swallow resolved 2. Clinical dehydration: After cycle 1 required IV fluids 3. Severe fatigue: Decreased the dosage of chemotherapy for cycle 2 4. Neutropenic fever: After cycle 1 of chemotherapy hospitalization with Klebsiella urinary tract infection treated with Keflex 5. Urinary tract infection currently and antibody  Anemia: Iron studies show 4% iron saturation with a ferritin of 291 which could be an acute phase reactant. Currently on oral iron supplement twice a day. Hemoglobin has improved to 9.3. Recent knee injury: Doing better today  Return to clinic in 3 weeks for cycle 3   No orders of the defined types were placed in this encounter.   The patient has a good understanding of the overall plan. she agrees with it. she will call with any problems that may develop before the next visit here.   Rulon Eisenmenger, MD

## 2014-11-05 ENCOUNTER — Encounter: Payer: Self-pay | Admitting: *Deleted

## 2014-11-05 ENCOUNTER — Encounter: Payer: Self-pay | Admitting: Hematology and Oncology

## 2014-11-05 NOTE — Progress Notes (Signed)
Rcvd referral from Sabino Gasser that pt needs help w/ gas and food.  I informed pt of the [employer] grant and what's needed to apply.  Pt will bring her bank statement on her next visit 02/11/15.  Once approved I will review what the grant will and will not cover.  I also gave her Vernie Shanks and [employer]'s number for food resources.

## 2014-11-05 NOTE — Progress Notes (Signed)
[place] Work  Clinical Social Work was referred by Development worker, community for assessment of psychosocial needs.  Clinical Social Worker contacted patient at home to offer support and assess for needs.  Pt reports she is currently staying at her sister's house in [place] and her sister is having to buy food additional food for her as a result. Pt reports she purchased her own food prior, but is currently strained due to medical bills. Pt and her husband both receive ss checks and pt was working small jobs prior to her diagnosis for a little additional income. This in turn helped cover her food bills. CSW problem solved with pt for additional resources for financial assistance. Pt could possibly qualify for medicaid to help cover her medicare payment. She may also qualify for food stamps. CSW directed pt to contact DSS in order to apply. Pt could also look for food assistance at [employer] and [employer]. Pt plans to contact DSS and other resources accordingly. CSW discussed with pt SCAT and [facility] to help with transportation concerns. Pt is also aware of how to contact [facility] for their Road to Recovery program. CSW to mail pt a SCAT application today that she can work on and bring to her next appointment to complete. CSW team to meet at next appt and further assess needs, complete SCAT application.   Clinical Social Work interventions: Resource education and referral  Loren Racer, [place] Worker [place]  [place] Phone: [phone] Fax: [phone]

## 2014-11-10 NOTE — Assessment & Plan Note (Signed)
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 Adjuvant chemo with TC X 4 (I chose his regimen because patient appears to be slightly older than her stated age and I'm concerned about toxicity to standard full dose chemotherapy with Adriamycin and Cytoxan followed by Taxol. In addition patient is also very reluctant to consider aggressive chemotherapy because she felt that her sister died because of toxicities of chemotherapy.) 2. Adjuvant XRT  Current treatment: Cycle 2 day 8 (started cycle 1 on 10/15/2014) of Taxotere Cytoxan Chemotherapy toxicities: 1. Oral thrush: Nystatin swish and swallow resolved 2. Clinical dehydration: After cycle 1 required IV fluids 3. Severe fatigue: Decreased the dosage of chemotherapy for cycle 2 4. Neutropenic fever: After cycle 1 of chemotherapy hospitalization with Klebsiella urinary tract infection treated with Keflex 5. Urinary tract infection currently and antibody  Anemia: Iron studies show 4% iron saturation with a ferritin of 291 which could be an acute phase reactant. Currently on oral iron supplement twice a day. Hemoglobin has improved to 9.3. Recent knee injury: Doing better today  Return to clinic in 2 weeks for cycle 3

## 2014-11-11 ENCOUNTER — Other Ambulatory Visit (HOSPITAL_BASED_OUTPATIENT_CLINIC_OR_DEPARTMENT_OTHER): Payer: Medicare Other

## 2014-11-11 ENCOUNTER — Encounter: Payer: Self-pay | Admitting: Hematology and Oncology

## 2014-11-11 ENCOUNTER — Telehealth: Payer: Self-pay | Admitting: Hematology and Oncology

## 2014-11-11 ENCOUNTER — Ambulatory Visit (HOSPITAL_BASED_OUTPATIENT_CLINIC_OR_DEPARTMENT_OTHER): Payer: Medicare Other

## 2014-11-11 ENCOUNTER — Ambulatory Visit (HOSPITAL_BASED_OUTPATIENT_CLINIC_OR_DEPARTMENT_OTHER): Payer: Medicare Other | Admitting: Hematology and Oncology

## 2014-11-11 ENCOUNTER — Ambulatory Visit (HOSPITAL_COMMUNITY)
Admission: RE | Admit: 2014-11-11 | Discharge: 2014-11-11 | Disposition: A | Discharge status: Home / Self Care | Payer: Medicare Other | Source: Ambulatory Visit | Attending: Hematology and Oncology | Admitting: Hematology and Oncology

## 2014-11-11 ENCOUNTER — Ambulatory Visit: Payer: Medicare Other

## 2014-11-11 VITALS — BP 123/65 | HR 93 | Temp 98.9°F

## 2014-11-11 VITALS — BP 123/65 | HR 93 | Temp 98.9°F | Resp 18 | Ht 60.0 in | Wt 159.8 lb

## 2014-11-11 DIAGNOSIS — E860 Dehydration: Secondary | ICD-10-CM

## 2014-11-11 DIAGNOSIS — C50411 Malignant neoplasm of upper-outer quadrant of right female breast: Secondary | ICD-10-CM

## 2014-11-11 DIAGNOSIS — Z171 Estrogen receptor negative status [ER-]: Secondary | ICD-10-CM | POA: Diagnosis not present

## 2014-11-11 DIAGNOSIS — D649 Anemia, unspecified: Secondary | ICD-10-CM

## 2014-11-11 DIAGNOSIS — Z95828 Presence of other vascular implants and grafts: Secondary | ICD-10-CM

## 2014-11-11 DIAGNOSIS — Z452 Encounter for adjustment and management of vascular access device: Secondary | ICD-10-CM | POA: Diagnosis not present

## 2014-11-11 LAB — CBC WITH DIFFERENTIAL/PLATELET
BASO%: 1.4 % (ref 0.0–2.0)
Basophils Absolute: 0.1 10*3/uL (ref 0.0–0.1)
EOS ABS: 0.1 10*3/uL (ref 0.0–0.5)
EOS%: 2.3 % (ref 0.0–7.0)
HEMATOCRIT: 31.9 % — AB (ref 34.8–46.6)
HGB: 10.1 g/dL — ABNORMAL LOW (ref 11.6–15.9)
LYMPH%: 24.8 % (ref 14.0–49.7)
MCH: 26 pg (ref 25.1–34.0)
MCHC: 31.7 g/dL (ref 31.5–36.0)
MCV: 82.2 fL (ref 79.5–101.0)
MONO#: 1.1 10*3/uL — ABNORMAL HIGH (ref 0.1–0.9)
MONO%: 21.3 % — AB (ref 0.0–14.0)
NEUT#: 2.6 10*3/uL (ref 1.5–6.5)
NEUT%: 50.2 % (ref 38.4–76.8)
Platelets: 127 10*3/uL — ABNORMAL LOW (ref 145–400)
RBC: 3.88 10*6/uL (ref 3.70–5.45)
RDW: 16.8 % — ABNORMAL HIGH (ref 11.2–14.5)
WBC: 5.2 10*3/uL (ref 3.9–10.3)
lymph#: 1.3 10*3/uL (ref 0.9–3.3)

## 2014-11-11 LAB — COMPREHENSIVE METABOLIC PANEL (CC13)
ALT: 9 U/L (ref 0–55)
AST: 12 U/L (ref 5–34)
Albumin: 3.4 g/dL — ABNORMAL LOW (ref 3.5–5.0)
Alkaline Phosphatase: 81 U/L (ref 40–150)
Anion Gap: 9 mEq/L (ref 3–11)
BUN: 13.2 mg/dL (ref 7.0–26.0)
CO2: 27 mEq/L (ref 22–29)
Calcium: 8.8 mg/dL (ref 8.4–10.4)
Chloride: 98 mEq/L (ref 98–109)
Creatinine: 0.8 mg/dL (ref 0.6–1.1)
EGFR: 76 mL/min/{1.73_m2} — ABNORMAL LOW (ref >90)
Glucose: 193 mg/dl — ABNORMAL HIGH (ref 70–140)
Potassium: 3.1 mEq/L — ABNORMAL LOW (ref 3.5–5.1)
Sodium: 134 mEq/L — ABNORMAL LOW (ref 136–145)
Total Bilirubin: 0.41 mg/dL (ref 0.20–1.20)
Total Protein: 6.5 g/dL (ref 6.4–8.3)

## 2014-11-11 MED ORDER — NYSTATIN 100000 UNIT/ML MT SUSP: 5.0000 mL | Freq: Four times a day (QID) | OROMUCOSAL | Status: DC

## 2014-11-11 MED ORDER — SODIUM CHLORIDE 0.9 % IV SOLN: INTRAVENOUS | Status: DC

## 2014-11-11 MED ORDER — SODIUM CHLORIDE 0.9 % IJ SOLN
10.0000 mL | INTRAMUSCULAR | Status: AC | PRN
  Administered 2014-11-11: 10 mL

## 2014-11-11 MED ORDER — HEPARIN SOD (PORK) LOCK FLUSH 100 UNIT/ML IV SOLN
500.0000 [IU] | Freq: Once | INTRAVENOUS | Status: DC
  Filled 2014-11-11: qty 5

## 2014-11-11 MED ORDER — SODIUM CHLORIDE 0.9 % IJ SOLN
10.0000 mL | INTRAMUSCULAR | Status: DC | PRN
Start: 2014-11-11 — End: 2014-11-11
  Administered 2014-11-11: 10 mL via INTRAVENOUS
  Filled 2014-11-11: qty 10

## 2014-11-11 MED ORDER — SODIUM CHLORIDE 0.9 % IJ SOLN
10.0000 mL | INTRAMUSCULAR | Status: DC | PRN
  Filled 2014-11-11: qty 10

## 2014-11-11 MED ORDER — HEPARIN SOD (PORK) LOCK FLUSH 100 UNIT/ML IV SOLN
500.0000 [IU] | INTRAVENOUS | Status: AC | PRN
  Administered 2014-11-11: 500 [IU]
  Filled 2014-11-11: qty 5

## 2014-11-11 MED ORDER — SODIUM CHLORIDE 0.9 % IV SOLN
Freq: Once | INTRAVENOUS | Status: AC
  Administered 2014-11-11: 13:00:00 via INTRAVENOUS

## 2014-11-11 NOTE — Patient Instructions (Signed)

## 2014-11-11 NOTE — Telephone Encounter (Signed)
Appointments made and avs pritned for patient °

## 2014-11-11 NOTE — Procedures (Addendum)
[facility]  Procedure Note  KIMBERLA DRISKILL DBZ:[id number] DOB: 03-05-1946 DOA: 11/11/2014   PCP: Johnnette Litter  Associated Diagnosis: Rt breast CA  Procedure Note: Pt arrived to receive IV fluids via Rt chest port.  Condition During Procedure: Pt a&ox4, pt in no distress. Pt tolerated IV fluid infusion well. Left chest port already accessed upon pt arrival from [facility].   Condition at Discharge: Pt a&ox4, pt in no distress. Pt tolerated IV fluid infusion well. Left chest port, flushed with and de-accessed. Dry bandage applied. Pt denies any further needs at this time. Pt transported to vehicle via wheelchair. Pt departed with niece.    Trixie Rude, RN  [facility]

## 2014-11-11 NOTE — Progress Notes (Signed)
Patient Care Team: Gaynelle Arabian, MD as PCP - General (Family Medicine) Jerline Pain, MD as Consulting Physician (Cardiology) Wonda Horner, MD as Consulting Physician (Gastroenterology) Ralene Ok, MD as Consulting Physician (Surgery)  DIAGNOSIS: No matching staging information was found for the patient.  SUMMARY OF ONCOLOGIC HISTORY:   Breast cancer of upper-outer quadrant of right female breast   07/30/2014 Mammogram Right breast 10 o'clock location 4 cm from the nipple measuring 1.7 x 1.3 x 1.3 cm. This corresponds to the palpable and mammographic finding. In the right axilla, there are several lymph nodes which are normal in size measuring up to 33mm.   08/27/2014 Initial Diagnosis Right Breast 10:00: IDC with LVI, Grade 3, Er 0%, PR 0%, Her 2 Neg, Ki 67: 30%   09/22/2014 Surgery Rt Lumpectomy: IDC grade 3, Size 2.6 cm, with HG DCIS, LVI present, ER/PR: 0%; 1/2 LN Positive: T2N1M0 Stage 2B   10/15/2014 -  Chemotherapy Adjuvant chemotherapy with Taxotere Cytoxan 4 q3 weeks   10/21/2014 - 10/24/2014 Hospital Admission Admission for neutropenic fever UTI Klebsiella    CHIEF COMPLIANT: Cycle 2 day 8 of Taxotere Cytoxan  INTERVAL HISTORY: Angela Bond is a 69 year old with above-mentioned history of right breast cancer treated with lumpectomy followed by adjuvant chemotherapy. After cycle one she had infection with Klebsiella UTI and has since resolved and got better. She has been on antibodies. She reports that cycle 2 of chemotherapy went fairly well. She does feel very fatigued for 2-3 days after chemotherapy and her energy levels have just started to recover. In spite of that she had lost a total of 11 pounds since last week. She reports lack of interest in eating and lack of taste as well. But overall she feels that this treatment went much better than the previous cycle and that her energy levels are now back to herself and she is eating better currently.  REVIEW OF SYSTEMS:    Constitutional: Denies fevers, chills or abnormal weight loss Eyes: Denies blurriness of vision Ears, nose, mouth, throat, and face: Denies mucositis or sore throat Respiratory: Denies cough, dyspnea or wheezes Cardiovascular: Denies palpitation, chest discomfort or lower extremity swelling Gastrointestinal:  Denies nausea, heartburn or change in bowel habits Skin: Denies abnormal skin rashes Lymphatics: Denies new lymphadenopathy or easy bruising Neurological:Denies numbness, tingling or new weaknesses Behavioral/Psych: Mood is stable, no new changes  All other systems were reviewed with the patient and are negative.  I have reviewed the past medical history, past surgical history, social history and family history with the patient and they are unchanged from previous note.  ALLERGIES:  is allergic to shellfish allergy; lactose intolerance (gi); aleve; aspirin; ciprofloxacin; doxycycline; evista; fosamax; pravachol; sulfa antibiotics; welchol; and zocor.  MEDICATIONS:  Current Outpatient Prescriptions  Medication Sig Dispense Refill  . calcium carbonate (OS-CAL) 600 MG TABS Take 600 mg by mouth 2 (two) times daily with a meal.    . carboxymethylcellulose (REFRESH PLUS) 0.5 % SOLN 1 drop 4 (four) times daily.     . Cholecalciferol (VITAMIN D) 2000 UNITS tablet Take 2,000 Units by mouth 2 (two) times daily.     Marland Kitchen dexamethasone (DECADRON) 4 MG tablet Take 1 tablet (4 mg total) by mouth 2 (two) times daily. Start the day before Taxotere. Then again the day after chemo for 3 days. 30 tablet 1  . dexlansoprazole (DEXILANT) 60 MG capsule Take 60 mg by mouth daily.    . ferrous sulfate 325 (65 FE) MG tablet Take  1 tablet (325 mg total) by mouth 2 (two) times daily with a meal. 30 tablet 3  . fexofenadine (ALLEGRA) 180 MG tablet Take 180 mg by mouth daily.    . fluconazole (DIFLUCAN) 200 MG tablet Take 1 tablet (200 mg total) by mouth daily. 5 tablet 0  . FLUoxetine (PROZAC) 20 MG capsule Take  20 mg by mouth daily.  11  . gabapentin (NEURONTIN) 300 MG capsule Take 300 mg by mouth at bedtime.     . lidocaine-prilocaine (EMLA) cream Apply to affected area once 30 g 3  . LORazepam (ATIVAN) 0.5 MG tablet Take 1 tablet (0.5 mg total) by mouth at bedtime. 30 tablet 0  . lovastatin (MEVACOR) 40 MG tablet Take 40 mg by mouth daily before breakfast.     . meclizine (ANTIVERT) 25 MG tablet Take 12.5-25 mg by mouth 3 (three) times daily as needed for dizziness.    . meloxicam (MOBIC) 15 MG tablet Take 15 mg by mouth daily.     Marland Kitchen nystatin (MYCOSTATIN) 100000 UNIT/ML suspension Take 5 mLs (500,000 Units total) by mouth 4 (four) times daily. 60 mL 0  . ondansetron (ZOFRAN) 8 MG tablet Take 1 tablet (8 mg total) by mouth 2 (two) times daily. Start the day after chemo for 3 days. Then take as needed for nausea or vomiting. 30 tablet 1  . oxyCODONE-acetaminophen (ROXICET) 5-325 MG per tablet Take 1-2 tablets by mouth every 4 (four) hours as needed. 30 tablet 0  . polyethylene glycol (MIRALAX / GLYCOLAX) packet Take 17 g by mouth as needed for mild constipation.     Marland Kitchen PRESCRIPTION MEDICATION Chemo; [place].    Marland Kitchen prochlorperazine (COMPAZINE) 10 MG tablet Take 1 tablet (10 mg total) by mouth every 6 (six) hours as needed (Nausea or vomiting). 30 tablet 1  . saccharomyces boulardii (FLORASTOR) 250 MG capsule Take 1 capsule (250 mg total) by mouth 2 (two) times daily. 30 capsule 0  . sodium chloride (OCEAN) 0.65 % SOLN nasal spray Place 2 sprays into both nostrils as needed for congestion.     Current Facility-Administered Medications  Medication Dose Route Frequency Provider Last Rate Last Dose  . heparin lock flush 100 unit/mL  500 Units Intravenous Once Nicholas Lose, MD      . sodium chloride 0.9 % injection 10 mL  10 mL Intravenous PRN Nicholas Lose, MD       Facility-Administered Medications Ordered in Other Visits  Medication Dose Route Frequency Provider Last Rate Last Dose  . heparin lock flush 100  unit/mL  500 Units Intracatheter PRN Nicholas Lose, MD      . sodium chloride 0.9 % injection 10 mL  10 mL Intracatheter PRN Nicholas Lose, MD        PHYSICAL EXAMINATION: ECOG PERFORMANCE STATUS: 2 - Symptomatic, <50% confined to bed  Filed Vitals:   11/11/14 1213  BP: 123/65  Pulse: 93  Temp: 98.9 F (37.2 C)  Resp: 18   Filed Weights   11/11/14 1213  Weight: 159 lb 12.8 oz (72.485 kg)    GENERAL:alert, no distress and comfortable SKIN: skin color, texture, turgor are normal, no rashes or significant lesions EYES: normal, Conjunctiva are pink and non-injected, sclera clear OROPHARYNX:no exudate, no erythema and lips, buccal mucosa, and tongue normal  NECK: supple, thyroid normal size, non-tender, without nodularity LYMPH:  no palpable lymphadenopathy in the cervical, axillary or inguinal LUNGS: clear to auscultation and percussion with normal breathing effort HEART: regular rate & rhythm and no  murmurs and no lower extremity edema ABDOMEN:abdomen soft, non-tender and normal bowel sounds Musculoskeletal:no cyanosis of digits and no clubbing  NEURO: alert & oriented x 3 with fluent speech, neuropathy in the feet  LABORATORY DATA:  I have reviewed the data as listed   Chemistry      Component Value Date/Time   NA 134* 11/11/2014 1139   NA 138 10/22/2014 0504   K 3.1* 11/11/2014 1139   K 3.8 10/22/2014 0504   CL 104 10/22/2014 0504   CO2 27 11/11/2014 1139   CO2 26 10/22/2014 0504   BUN 13.2 11/11/2014 1139   BUN 9 10/22/2014 0504   CREATININE 0.8 11/11/2014 1139   CREATININE 0.64 10/22/2014 0504      Component Value Date/Time   CALCIUM 8.8 11/11/2014 1139   CALCIUM 8.3* 10/22/2014 0504   ALKPHOS 81 11/11/2014 1139   ALKPHOS 67 10/21/2014 1723   AST 12 11/11/2014 1139   AST 12* 10/21/2014 1723   ALT 9 11/11/2014 1139   ALT 11* 10/21/2014 1723   BILITOT 0.41 11/11/2014 1139   BILITOT 0.9 10/21/2014 1723       Lab Results  Component Value Date   WBC 5.2  11/11/2014   HGB 10.1* 11/11/2014   HCT 31.9* 11/11/2014   MCV 82.2 11/11/2014   PLT 127* 11/11/2014   NEUTROABS 2.6 11/11/2014    ASSESSMENT & PLAN:  Breast cancer of upper-outer quadrant of right female breast Rt Lumpectomy: IDC grade 3, 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 Adjuvant chemo with TC X 4 (I chose his regimen because patient appears to be slightly older than her stated age and I'm concerned about toxicity to standard full dose chemotherapy with Adriamycin and Cytoxan followed by Taxol. In addition patient is also very reluctant to consider aggressive chemotherapy because she felt that her sister died because of toxicities of chemotherapy.) 2. Adjuvant XRT  Current treatment: Cycle 2 day 8 (started cycle 1 on 10/15/2014) of Taxotere Cytoxan Chemotherapy toxicities: 1. Oral thrush: Nystatin swish and swallow resolved 2. Clinical dehydration: After cycle 1 and 2, required IV fluids 3. Severe fatigue: Decreased the dosage of chemotherapy for cycle 2 4. Neutropenic fever: After cycle 1 of chemotherapy hospitalization with Klebsiella urinary tract infection treated with Keflex 5. Urinary tract infection currently and antibody  Anemia: Currently on iron supplementation her hemoglobin is improved to 10.3. Recent knee injury: Doing better today  Dehydration: We will give her 5 mL of normal saline today Return to clinic in 2 weeks for cycle 3   No orders of the defined types were placed in this encounter.   The patient has a good understanding of the overall plan. she agrees with it. she will call with any problems that may develop before the next visit here.   Rulon Eisenmenger, MD

## 2014-11-13 ENCOUNTER — Encounter: Payer: Self-pay | Admitting: *Deleted

## 2014-11-13 NOTE — Progress Notes (Signed)
[place] Work  Clinical Social Work was referred by need for CSW follow up and for assessment of psychosocial needs due to food insecurity and transportation concerns.  Clinical Social Worker contacted patient at home to offer support and assess for needs due to missing pt on Tuesday, June 28. Pt reports she is back home and received the SCAT application via mail. CSW reviewed with pt how to complete application and to return it when comes to appointment on 7/12 for CSW assistance in completing application.   Pt reports her husband is home from SNF, but will have surgery in the next few days and will be returning to the hospital. Pt was not sure if her husband would return to SNF after said surgery. Pt open to referral to mobile meals for both her and her husband. CSW called [employer] and made referral today. They will be contacted for further assessment by Public librarian. Pt also aware of how to reach [employer] for transportation assistance. Pt appreciated contact today.   Clinical Social Work interventions: Theme park manager education and referral  Loren Racer, [place] Worker [place]  [place] Phone: [phone] Fax: [phone]

## 2014-11-25 ENCOUNTER — Encounter: Payer: Self-pay | Admitting: General Practice

## 2014-11-25 ENCOUNTER — Ambulatory Visit (HOSPITAL_BASED_OUTPATIENT_CLINIC_OR_DEPARTMENT_OTHER): Payer: Medicare Other | Admitting: Hematology and Oncology

## 2014-11-25 ENCOUNTER — Encounter: Payer: Self-pay | Admitting: *Deleted

## 2014-11-25 ENCOUNTER — Other Ambulatory Visit (HOSPITAL_BASED_OUTPATIENT_CLINIC_OR_DEPARTMENT_OTHER): Payer: Medicare Other

## 2014-11-25 ENCOUNTER — Telehealth: Payer: Self-pay | Admitting: Hematology and Oncology

## 2014-11-25 ENCOUNTER — Ambulatory Visit (HOSPITAL_BASED_OUTPATIENT_CLINIC_OR_DEPARTMENT_OTHER): Payer: Medicare Other

## 2014-11-25 ENCOUNTER — Encounter: Payer: Self-pay | Admitting: Hematology and Oncology

## 2014-11-25 ENCOUNTER — Ambulatory Visit: Payer: Medicare Other | Admitting: Nutrition

## 2014-11-25 ENCOUNTER — Ambulatory Visit: Payer: Medicare Other

## 2014-11-25 VITALS — BP 144/83 | HR 80 | Temp 98.8°F | Resp 18 | Ht 60.0 in | Wt 163.7 lb

## 2014-11-25 DIAGNOSIS — D649 Anemia, unspecified: Secondary | ICD-10-CM

## 2014-11-25 DIAGNOSIS — C50411 Malignant neoplasm of upper-outer quadrant of right female breast: Secondary | ICD-10-CM

## 2014-11-25 DIAGNOSIS — Z171 Estrogen receptor negative status [ER-]: Secondary | ICD-10-CM | POA: Diagnosis not present

## 2014-11-25 DIAGNOSIS — Z95828 Presence of other vascular implants and grafts: Secondary | ICD-10-CM

## 2014-11-25 DIAGNOSIS — C773 Secondary and unspecified malignant neoplasm of axilla and upper limb lymph nodes: Secondary | ICD-10-CM | POA: Diagnosis not present

## 2014-11-25 DIAGNOSIS — Z5111 Encounter for antineoplastic chemotherapy: Secondary | ICD-10-CM

## 2014-11-25 DIAGNOSIS — Z5189 Encounter for other specified aftercare: Secondary | ICD-10-CM

## 2014-11-25 LAB — COMPREHENSIVE METABOLIC PANEL (CC13)
ALBUMIN: 3.1 g/dL — AB (ref 3.5–5.0)
ALT: 11 U/L (ref 0–55)
AST: 13 U/L (ref 5–34)
Alkaline Phosphatase: 76 U/L (ref 40–150)
Anion Gap: 11 mEq/L (ref 3–11)
BUN: 14.3 mg/dL (ref 7.0–26.0)
CO2: 26 mEq/L (ref 22–29)
CREATININE: 0.8 mg/dL (ref 0.6–1.1)
Calcium: 9.5 mg/dL (ref 8.4–10.4)
Chloride: 102 mEq/L (ref 98–109)
EGFR: 77 mL/min/{1.73_m2} — AB (ref >90)
Glucose: 114 mg/dl (ref 70–140)
Potassium: 3.8 mEq/L (ref 3.5–5.1)
Sodium: 139 mEq/L (ref 136–145)
TOTAL PROTEIN: 7.2 g/dL (ref 6.4–8.3)
Total Bilirubin: 0.28 mg/dL (ref 0.20–1.20)

## 2014-11-25 LAB — CBC WITH DIFFERENTIAL/PLATELET
BASO%: 0.7 % (ref 0.0–2.0)
BASOS ABS: 0.1 10*3/uL (ref 0.0–0.1)
EOS ABS: 0 10*3/uL (ref 0.0–0.5)
EOS%: 0.1 % (ref 0.0–7.0)
HCT: 29.7 % — ABNORMAL LOW (ref 34.8–46.6)
HEMOGLOBIN: 9.7 g/dL — AB (ref 11.6–15.9)
LYMPH%: 12.5 % — AB (ref 14.0–49.7)
MCH: 26.6 pg (ref 25.1–34.0)
MCHC: 32.5 g/dL (ref 31.5–36.0)
MCV: 81.8 fL (ref 79.5–101.0)
MONO#: 1 10*3/uL — ABNORMAL HIGH (ref 0.1–0.9)
MONO%: 10.1 % (ref 0.0–14.0)
NEUT#: 7.6 10*3/uL — ABNORMAL HIGH (ref 1.5–6.5)
NEUT%: 76.6 % (ref 38.4–76.8)
Platelets: 386 10*3/uL (ref 145–400)
RBC: 3.63 10*6/uL — ABNORMAL LOW (ref 3.70–5.45)
RDW: 20.7 % — ABNORMAL HIGH (ref 11.2–14.5)
WBC: 9.9 10*3/uL (ref 3.9–10.3)
lymph#: 1.2 10*3/uL (ref 0.9–3.3)

## 2014-11-25 MED ORDER — PALONOSETRON HCL INJECTION 0.25 MG/5ML
INTRAVENOUS | Status: AC
  Filled 2014-11-25: qty 5

## 2014-11-25 MED ORDER — FOSAPREPITANT DIMEGLUMINE INJECTION 150 MG
Freq: Once | INTRAVENOUS | Status: AC
  Administered 2014-11-25: 13:00:00 via INTRAVENOUS
  Filled 2014-11-25: qty 5

## 2014-11-25 MED ORDER — DOCETAXEL CHEMO INJECTION 160 MG/16ML
60.0000 mg/m2 | Freq: Once | INTRAVENOUS | Status: AC
  Administered 2014-11-25: 110 mg via INTRAVENOUS
  Filled 2014-11-25: qty 11

## 2014-11-25 MED ORDER — SODIUM CHLORIDE 0.9 % IV SOLN
Freq: Once | INTRAVENOUS | Status: AC
  Administered 2014-11-25: 13:00:00 via INTRAVENOUS

## 2014-11-25 MED ORDER — HEPARIN SOD (PORK) LOCK FLUSH 100 UNIT/ML IV SOLN
500.0000 [IU] | Freq: Once | INTRAVENOUS | Status: AC | PRN
  Administered 2014-11-25: 500 [IU]
  Filled 2014-11-25: qty 5

## 2014-11-25 MED ORDER — SODIUM CHLORIDE 0.9 % IJ SOLN
10.0000 mL | INTRAMUSCULAR | Status: DC | PRN
  Administered 2014-11-25: 10 mL via INTRAVENOUS
  Filled 2014-11-25: qty 10

## 2014-11-25 MED ORDER — PEGFILGRASTIM 6 MG/0.6ML ~~LOC~~ PSKT
6.0000 mg | PREFILLED_SYRINGE | Freq: Once | SUBCUTANEOUS | Status: AC
  Administered 2014-11-25: 6 mg via SUBCUTANEOUS
  Filled 2014-11-25: qty 0.6

## 2014-11-25 MED ORDER — SODIUM CHLORIDE 0.9 % IJ SOLN
10.0000 mL | INTRAMUSCULAR | Status: DC | PRN
  Administered 2014-11-25: 10 mL
  Filled 2014-11-25: qty 10

## 2014-11-25 MED ORDER — PALONOSETRON HCL INJECTION 0.25 MG/5ML
0.2500 mg | Freq: Once | INTRAVENOUS | Status: AC
  Administered 2014-11-25: 0.25 mg via INTRAVENOUS

## 2014-11-25 MED ORDER — SODIUM CHLORIDE 0.9 % IV SOLN
500.0000 mg/m2 | Freq: Once | INTRAVENOUS | Status: AC
  Administered 2014-11-25: 900 mg via INTRAVENOUS
  Filled 2014-11-25: qty 45

## 2014-11-25 NOTE — Assessment & Plan Note (Signed)
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 Adjuvant chemo with TC X 4 (I chose his regimen because patient appears to be slightly older than her stated age and I'm concerned about toxicity to standard full dose chemotherapy with Adriamycin and Cytoxan followed by Taxol. In addition patient is also very reluctant to consider aggressive chemotherapy because she felt that her sister died because of toxicities of chemotherapy.) 2. Adjuvant XRT  Current treatment: Cycle 3 day 1 (started cycle 1 on 10/15/2014) of Taxotere Cytoxan Chemotherapy toxicities: 1. Oral thrush: Nystatin swish and swallow resolved 2. Clinical dehydration: After cycle 1 and 2, required IV fluids 3. Severe fatigue: Decreased the dosage of chemotherapy for cycle 2 4. Neutropenic fever: After cycle 1 of chemotherapy hospitalization with Klebsiella urinary tract infection treated with Keflex 5. Urinary tract infection currently and antibody  Anemia: Currently on iron supplementation her hemoglobin is improved to 10.3. Knee injury: Doing better today  Dehydration: Much improved Return to clinic in 3 weeks for cycle 4, which will be her last chemotherapy

## 2014-11-25 NOTE — Progress Notes (Signed)
Patient Care Team: Gaynelle Arabian, MD as PCP - General (Family Medicine) Jerline Pain, MD as Consulting Physician (Cardiology) Wonda Horner, MD as Consulting Physician (Gastroenterology) Ralene Ok, MD as Consulting Physician (Surgery)  DIAGNOSIS: No matching staging information was found for the patient.  SUMMARY OF ONCOLOGIC HISTORY:   Breast cancer of upper-outer quadrant of right female breast   07/30/2014 Mammogram Right breast 10 o'clock location 4 cm from the nipple measuring 1.7 x 1.3 x 1.3 cm. This corresponds to the palpable and mammographic finding. In the right axilla, there are several lymph nodes which are normal in size measuring up to 22mm.   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 Today is cycle 3 of Taxotere Cytoxan. She did a lot better with the last cycle of chemotherapy. However she had to be given IV fluids a week after treatment. She had felt markedly better after receiving the fluids. She is currently on antibiotics for urinary tract infection. She complains of a cold like symptoms. She denies any fevers or chills.  REVIEW OF SYSTEMS:   Constitutional: Denies fevers, chills or abnormal weight loss Eyes: Denies blurriness of vision Ears, nose, mouth, throat, and face: Denies mucositis or sore throat Respiratory: Denies cough, dyspnea or wheezes Cardiovascular: Denies palpitation, chest discomfort or lower extremity  swelling Gastrointestinal:  Denies nausea, heartburn or change in bowel habits Skin: Denies abnormal skin rashes Lymphatics: Denies new lymphadenopathy or easy bruising Neurological:Denies numbness, tingling or new weaknesses Behavioral/Psych: Mood is stable, no new changes  All other systems were reviewed with the patient and are negative.  I have reviewed the past medical history, past surgical history, social history and family history with the patient and they are unchanged from previous note.  ALLERGIES:  is allergic to shellfish allergy; lactose intolerance (gi); aleve; aspirin; ciprofloxacin; doxycycline; evista; fosamax; pravachol; sulfa antibiotics; welchol; and zocor.  MEDICATIONS:  Current Outpatient Prescriptions  Medication Sig Dispense Refill  . amoxicillin-clavulanate (AUGMENTIN) 875-125 MG per tablet     . calcium carbonate (OS-CAL) 600 MG TABS Take 600 mg by mouth 2 (two) times daily with a meal.    . carboxymethylcellulose (REFRESH PLUS) 0.5 % SOLN 1 drop 4 (four) times daily.     . Cholecalciferol (VITAMIN D) 2000 UNITS tablet Take 2,000 Units by mouth 2 (two) times daily.     Marland Kitchen dexamethasone (DECADRON) 4 MG tablet Take 1 tablet (4 mg total) by mouth 2 (two) times daily. Start the day before Taxotere. Then again the day after chemo for 3 days. 30 tablet 1  . dexlansoprazole (DEXILANT) 60 MG capsule Take 60 mg by mouth daily.    . ferrous sulfate 325 (65 FE) MG tablet Take 1 tablet (325 mg total) by mouth 2 (two) times daily with a meal. 30 tablet 3  . fexofenadine (ALLEGRA) 180 MG tablet Take 180 mg by mouth daily.    . fluconazole (DIFLUCAN) 200 MG tablet Take 1 tablet (  200 mg total) by mouth daily. 5 tablet 0  . FLUoxetine (PROZAC) 20 MG capsule Take 20 mg by mouth daily.  11  . gabapentin (NEURONTIN) 300 MG capsule Take 300 mg by mouth at bedtime.     . lidocaine-prilocaine (EMLA) cream Apply to affected area once 30 g 3  . LORazepam (ATIVAN) 0.5 MG tablet Take 1  tablet (0.5 mg total) by mouth at bedtime. 30 tablet 0  . lovastatin (MEVACOR) 40 MG tablet Take 40 mg by mouth daily before breakfast.     . meclizine (ANTIVERT) 25 MG tablet Take 12.5-25 mg by mouth 3 (three) times daily as needed for dizziness.    . meloxicam (MOBIC) 15 MG tablet Take 15 mg by mouth daily.     Marland Kitchen nystatin (MYCOSTATIN) 100000 UNIT/ML suspension Take 5 mLs (500,000 Units total) by mouth 4 (four) times daily. 60 mL 0  . ondansetron (ZOFRAN) 8 MG tablet Take 1 tablet (8 mg total) by mouth 2 (two) times daily. Start the day after chemo for 3 days. Then take as needed for nausea or vomiting. 30 tablet 1  . oxyCODONE-acetaminophen (ROXICET) 5-325 MG per tablet Take 1-2 tablets by mouth every 4 (four) hours as needed. 30 tablet 0  . polyethylene glycol (MIRALAX / GLYCOLAX) packet Take 17 g by mouth as needed for mild constipation.     Marland Kitchen PRESCRIPTION MEDICATION Chemo; [place].    Marland Kitchen prochlorperazine (COMPAZINE) 10 MG tablet Take 1 tablet (10 mg total) by mouth every 6 (six) hours as needed (Nausea or vomiting). 30 tablet 1  . saccharomyces boulardii (FLORASTOR) 250 MG capsule Take 1 capsule (250 mg total) by mouth 2 (two) times daily. 30 capsule 0  . sodium chloride (OCEAN) 0.65 % SOLN nasal spray Place 2 sprays into both nostrils as needed for congestion.     No current facility-administered medications for this visit.    PHYSICAL EXAMINATION: ECOG PERFORMANCE STATUS: 1 - Symptomatic but completely ambulatory  Filed Vitals:   11/25/14 1127  BP: 144/83  Pulse: 80  Temp: 98.8 F (37.1 C)  Resp: 18   Filed Weights   11/25/14 1127  Weight: 163 lb 11.2 oz (74.254 kg)    GENERAL:alert, no distress and comfortable SKIN: skin color, texture, turgor are normal, no rashes or significant lesions EYES: normal, Conjunctiva are pink and non-injected, sclera clear OROPHARYNX:no exudate, no erythema and lips, buccal mucosa, and tongue normal  NECK: supple, thyroid normal size, non-tender,  without nodularity LYMPH:  no palpable lymphadenopathy in the cervical, axillary or inguinal LUNGS: clear to auscultation and percussion with normal breathing effort HEART: regular rate & rhythm and no murmurs and no lower extremity edema ABDOMEN:abdomen soft, non-tender and normal bowel sounds Musculoskeletal:no cyanosis of digits and no clubbing  NEURO: alert & oriented x 3 with fluent speech, no focal motor/sensory deficits  LABORATORY DATA:  I have reviewed the data as listed   Chemistry      Component Value Date/Time   NA 139 11/25/2014 1036   NA 138 10/22/2014 0504   K 3.8 11/25/2014 1036   K 3.8 10/22/2014 0504   CL 104 10/22/2014 0504   CO2 26 11/25/2014 1036   CO2 26 10/22/2014 0504   BUN 14.3 11/25/2014 1036   BUN 9 10/22/2014 0504   CREATININE 0.8 11/25/2014 1036   CREATININE 0.64 10/22/2014 0504      Component Value Date/Time   CALCIUM 9.5 11/25/2014 1036   CALCIUM 8.3* 10/22/2014 0504   ALKPHOS 76 11/25/2014  1036   ALKPHOS 67 10/21/2014 1723   AST 13 11/25/2014 1036   AST 12* 10/21/2014 1723   ALT 11 11/25/2014 1036   ALT 11* 10/21/2014 1723   BILITOT 0.28 11/25/2014 1036   BILITOT 0.9 10/21/2014 1723       Lab Results  Component Value Date   WBC 9.9 11/25/2014   HGB 9.7* 11/25/2014   HCT 29.7* 11/25/2014   MCV 81.8 11/25/2014   PLT 386 11/25/2014   NEUTROABS 7.6* 11/25/2014   ASSESSMENT & PLAN:  Breast cancer of upper-outer quadrant of right female breast Rt Lumpectomy: IDC grade 3, 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 Adjuvant chemo with TC X 4 (I chose his regimen because patient appears to be slightly older than her stated age and I'm concerned about toxicity to standard full dose chemotherapy with Adriamycin and Cytoxan followed by Taxol. In addition patient is also very reluctant to consider aggressive chemotherapy because she felt that her sister  died because of toxicities of chemotherapy.) 2. Adjuvant XRT  Current treatment: Cycle 3 day 1 (started cycle 1 on 10/15/2014) of Taxotere Cytoxan Chemotherapy toxicities: 1. Oral thrush: Nystatin swish and swallow resolved 2. Clinical dehydration: After cycle 1 and 2, required IV fluids 3. Severe fatigue: Decreased the dosage of chemotherapy for cycle 2 4. Neutropenic fever: After cycle 1 of chemotherapy hospitalization with Klebsiella urinary tract infection treated with Keflex 5. Urinary tract infection currently and antibody  Anemia: Currently on iron supplementation her hemoglobin is improved to 10.3. Knee injury: Doing better today  Dehydration: Much improved Return to clinic in 3 weeks for cycle 4, which will be her last chemotherapy  No orders of the defined types were placed in this encounter.   The patient has a good understanding of the overall plan. she agrees with it. she will call with any problems that may develop before the next visit here.   Rulon Eisenmenger, MD    Umbilical cord

## 2014-11-25 NOTE — Telephone Encounter (Signed)
Gave avs & calendar for July/August. °

## 2014-11-25 NOTE — Progress Notes (Signed)
[place] Work  Holiday representative met with patient in the infusion room at [facility] to offer support and assess for needs.  Patient had expressed transportation concerns.  CSW and patient discussed SCAT and the application process.  Patient was agreeable to completing scat application.  CSw and patient reviewed/completed scat application.  CSw submitted application and SCAT office will contact patient once approved.   Johnnye Lana, MSW, LCSW, OSW-C Clinical Social Worker [facility] [phone]

## 2014-11-25 NOTE — Progress Notes (Signed)
Spiritual Care Note  Met with Angela Bond in the lobby, providing pastoral presence and reflective listening as she shared about recent additional health challenges for her and her husband.  Per pt, they have been married 38 years and are good support for each other; however, each is limited by his/her own physical needs at this time.  She is using perspective to cope and to keep her spirits up.   Following for support, but please also page as needs arise.  Thank you.  Suzette Battiest, [place] Pager [phone] Voicemail [phone]

## 2014-11-25 NOTE — Patient Instructions (Signed)

## 2014-11-25 NOTE — Patient Instructions (Addendum)
[facility] Discharge Instructions for Patients Receiving Chemotherapy  Today you received the following chemotherapy agents Cytoxan and Taxotere  To help prevent nausea and vomiting after your treatment, we encourage you to take your nausea medication     If you develop nausea and vomiting that is not controlled by your nausea medication, call the clinic.   BELOW ARE SYMPTOMS THAT SHOULD BE REPORTED IMMEDIATELY:  *FEVER GREATER THAN 100.5 F  *CHILLS WITH OR WITHOUT FEVER  NAUSEA AND VOMITING THAT IS NOT CONTROLLED WITH YOUR NAUSEA MEDICATION  *UNUSUAL SHORTNESS OF BREATH  *UNUSUAL BRUISING OR BLEEDING  TENDERNESS IN MOUTH AND THROAT WITH OR WITHOUT PRESENCE OF ULCERS  *URINARY PROBLEMS  *BOWEL PROBLEMS  UNUSUAL RASH Items with * indicate a potential emergency and should be followed up as soon as possible.  Feel free to call the clinic you have any questions or concerns. The clinic phone number is [phone].  Please show the [place] at check-in to the Emergency Department and triage nurse.

## 2014-11-25 NOTE — Progress Notes (Signed)
Patient was identified on the MST to be at risk for malnutrition secondary to poor appetite and weight loss.  69 year old female diagnosed with breast cancer in March 2016.  She is a patient of Dr. Lindi Adie.  Past medical history includes arthritis, hypertension, GERD, hypercholesterolemia, anxiety, depression, hiatal hernia, pneumonia and shingles.  Medications include Os-Cal, vitamin D, Decadron, ferrous sulfate, Prozac, Ativan, Zofran, MiraLAX, Compazine, and florastor.  Labs include sodium 134, potassium 3.1, albumin 3.4 on June 28.  Height: 5 feet. Weight: 163.7 pounds July 12.  Patient weighed 159.8 pounds on June 28. Usual body weight: 175-185 pounds. BMI: 31.97  Patient reports she has recently bought boost and has been consuming daily. She states her appetite is improved. She complains of poor oral intake after chemotherapy.  Nutrition diagnosis: Unintended weight loss related to poor appetite as evidenced by approximately 15 pound weight loss from usual body weight.  Intervention:  Educated patient to consume small frequent meals and snacks consisting of whole fruits, vegetables and lean proteins. Reviewed high protein foods with patient and provided a fact sheet Noted patient has lactose intolerance and a shellfish allergy.  Reviewed available substitutions. Recommended patient utilize boost plus when she is unable to consume adequate meals and snacks. Stressed importance of weight maintenance. Encouraged patient to increase water intake. Provided fact sheets, coupons, and my contact information for questions. Teach back method was used.  Monitoring, evaluation, goals: Patient will tolerate a healthy plant-based diet to minimize loss of lean body mass throughout treatment.  Next visit: Patient will contact me for questions or concerns.  **Disclaimer: This note was dictated with voice recognition software. Similar sounding words can inadvertently be transcribed and this note  may contain transcription errors which may not have been corrected upon publication of note.**

## 2014-11-26 ENCOUNTER — Encounter (HOSPITAL_COMMUNITY): Payer: Self-pay | Admitting: Emergency Medicine

## 2014-11-26 ENCOUNTER — Emergency Department (HOSPITAL_COMMUNITY)
Admission: EM | Admit: 2014-11-26 | Discharge: 2014-11-26 | Disposition: A | Discharge status: Home / Self Care | Payer: Medicare Other | Attending: Emergency Medicine | Admitting: Emergency Medicine

## 2014-11-26 DIAGNOSIS — Z791 Long term (current) use of non-steroidal anti-inflammatories (NSAID): Secondary | ICD-10-CM | POA: Diagnosis not present

## 2014-11-26 DIAGNOSIS — Z79899 Other long term (current) drug therapy: Secondary | ICD-10-CM | POA: Insufficient documentation

## 2014-11-26 DIAGNOSIS — E780 Pure hypercholesterolemia: Secondary | ICD-10-CM | POA: Diagnosis not present

## 2014-11-26 DIAGNOSIS — F329 Major depressive disorder, single episode, unspecified: Secondary | ICD-10-CM | POA: Insufficient documentation

## 2014-11-26 DIAGNOSIS — K219 Gastro-esophageal reflux disease without esophagitis: Secondary | ICD-10-CM | POA: Insufficient documentation

## 2014-11-26 DIAGNOSIS — R011 Cardiac murmur, unspecified: Secondary | ICD-10-CM | POA: Insufficient documentation

## 2014-11-26 DIAGNOSIS — M1990 Unspecified osteoarthritis, unspecified site: Secondary | ICD-10-CM | POA: Insufficient documentation

## 2014-11-26 DIAGNOSIS — F419 Anxiety disorder, unspecified: Secondary | ICD-10-CM | POA: Insufficient documentation

## 2014-11-26 DIAGNOSIS — Z8701 Personal history of pneumonia (recurrent): Secondary | ICD-10-CM | POA: Diagnosis not present

## 2014-11-26 DIAGNOSIS — D649 Anemia, unspecified: Secondary | ICD-10-CM | POA: Diagnosis not present

## 2014-11-26 DIAGNOSIS — I10 Essential (primary) hypertension: Secondary | ICD-10-CM | POA: Diagnosis not present

## 2014-11-26 DIAGNOSIS — Z5111 Encounter for antineoplastic chemotherapy: Secondary | ICD-10-CM | POA: Diagnosis present

## 2014-11-26 DIAGNOSIS — C50919 Malignant neoplasm of unspecified site of unspecified female breast: Secondary | ICD-10-CM | POA: Diagnosis not present

## 2014-11-26 DIAGNOSIS — Z8619 Personal history of other infectious and parasitic diseases: Secondary | ICD-10-CM | POA: Insufficient documentation

## 2014-11-26 NOTE — ED Provider Notes (Signed)
CSN: [id number]     Arrival date & time 11/26/14  1822 History   First MD Initiated Contact with Patient 11/26/14 1916     Chief Complaint  Patient presents with  . "Shot came out"   . Chemotherapy      HPI Patient currently has a Neulasta on Pro attached to her right lower quadrant.  She states this was scheduled to come off at 6 PM tonight and when the device began to infuse fluid but nontender abdomen.  She called on-call oncology nurse who recommended that she come to the ER.  Patient has no complaints at this time.  She is currently being treated with chemotherapy for breast cancer   Past Medical History  Diagnosis Date  . Arthritis   . Hypertension   . GERD (gastroesophageal reflux disease)   . Hypercholesteremia   . Heart murmur   . Anxiety and depression   . H/O hiatal hernia   . Headache(784.0)     sinus  . Dysrhythmia   . Shortness of breath     "stomach is in lungs" when had hernia 2014 none now(07/28/14)  . Pneumonia     hx  . Depression   . Anxiety   . Anemia     hx  . Shingles 5/15    hx  . Breast cancer 3/16   Past Surgical History  Procedure Laterality Date  . Total knee arthroplasty Bilateral   . Esophageal manometry N/A 07/02/2012    Procedure: ESOPHAGEAL MANOMETRY (EM);  Surgeon: Lear Ng, MD;  Location: [facility];  Service: Endoscopy;  Laterality: N/A;  schooler to read  . Tonsillectomy  as child  . Hiatal hernia repair N/A 08/10/2012    Procedure: LAPAROSCOPIC REPAIR OF HIATAL HERNIA WITH MESH AND EGD WITH PEG TUBE PLACEMENT;  Surgeon: Ralene Ok, MD;  Location: [facility];  Service: General;  Laterality: N/A;  . Breast surgery      r breast biopsy  . Joint replacement Bilateral 2011,2012    knees  . Eye surgery  2013    tube placed Left ,tear duct  . Breast lumpectomy with sentinel lymph node biopsy Right 09/22/2014    Procedure: RIGHT BREAST LUMPECTOMY WITH NEEDLE LOCALIZATION AND RIGHT AXILLARY SENTINEL LYMPH NODE BIOPSY;   Surgeon: [name], MD;  Location: [facility];  Service: General;  Laterality: Right;  . Portacath placement Left 09/22/2014    Procedure: INSERTION PORT-A-CATH;  Surgeon: [name], MD;  Location: [facility];  Service: General;  Laterality: Left;   Family History  Problem Relation Age of Onset  . Heart disease Mother     39s, CHF  . Heart disease Father     d/o MI at 24  . Cancer Father     skin  . Cancer Sister     breast  . Heart disease Sister     CABG in mid-60s   History  Substance Use Topics  . Smoking status: Never Smoker   . Smokeless tobacco: Never Used  . Alcohol Use: No   OB History    No data available     Review of Systems  All other systems reviewed and are negative.     Allergies  Shellfish allergy; Lactose intolerance (gi); Aleve; Aspirin; Ciprofloxacin; Doxycycline; Evista; Fosamax; Pravachol; Sulfa antibiotics; Welchol; and Zocor  Home Medications   Prior to Admission medications   Medication Sig Start Date End Date Taking? Authorizing Provider  amoxicillin-clavulanate (AUGMENTIN) 875-125 MG per tablet Take 1  tablet by mouth every 12 (twelve) hours. For 10 Days. ABT Start Date 11/24/14 & 12/03/14. 11/24/14  Yes Historical Provider, MD  calcium carbonate (OS-CAL) 600 MG TABS Take 600 mg by mouth 2 (two) times daily with a meal.   Yes Historical Provider, MD  carboxymethylcellulose (REFRESH PLUS) 0.5 % SOLN 1 drop 4 (four) times daily.    Yes Historical Provider, MD  Cholecalciferol (VITAMIN D) 2000 UNITS tablet Take 2,000 Units by mouth 2 (two) times daily.    Yes Historical Provider, MD  dexamethasone (DECADRON) 4 MG tablet Take 1 tablet (4 mg total) by mouth 2 (two) times daily. Start the day before Taxotere. Then again the day after chemo for 3 days. 09/30/14  Yes Nicholas Lose, MD  dexlansoprazole (DEXILANT) 60 MG capsule Take 60 mg by mouth daily.   Yes Historical Provider, MD  ferrous sulfate 325 (65 FE) MG tablet Take 1 tablet (325 mg total) by mouth 2  (two) times daily with a meal. 10/24/14  Yes Hosie Poisson, MD  fexofenadine (ALLEGRA) 180 MG tablet Take 180 mg by mouth daily.   Yes Historical Provider, MD  fluconazole (DIFLUCAN) 200 MG tablet Take 1 tablet (200 mg total) by mouth daily. 10/24/14  Yes Hosie Poisson, MD  FLUoxetine (PROZAC) 20 MG capsule Take 20 mg by mouth daily. 06/24/14  Yes Historical Provider, MD  gabapentin (NEURONTIN) 300 MG capsule Take 300 mg by mouth at bedtime.  09/09/14  Yes Historical Provider, MD  lidocaine-prilocaine (EMLA) cream Apply to affected area once 09/30/14  Yes Nicholas Lose, MD  LORazepam (ATIVAN) 0.5 MG tablet Take 1 tablet (0.5 mg total) by mouth at bedtime. 11/04/14  Yes Nicholas Lose, MD  lovastatin (MEVACOR) 40 MG tablet Take 40 mg by mouth daily before breakfast.    Yes Historical Provider, MD  meloxicam (MOBIC) 15 MG tablet Take 15 mg by mouth daily.  09/09/14  Yes Historical Provider, MD  nystatin (MYCOSTATIN) 100000 UNIT/ML suspension Take 5 mLs (500,000 Units total) by mouth 4 (four) times daily. 11/11/14  Yes Nicholas Lose, MD  ondansetron (ZOFRAN) 8 MG tablet Take 1 tablet (8 mg total) by mouth 2 (two) times daily. Start the day after chemo for 3 days. Then take as needed for nausea or vomiting. 09/30/14  Yes Nicholas Lose, MD  polyethylene glycol (MIRALAX / GLYCOLAX) packet Take 17 g by mouth as needed for mild constipation.    Yes Historical Provider, MD  [place]; [place].   Yes Historical Provider, MD  prochlorperazine (COMPAZINE) 10 MG tablet Take 1 tablet (10 mg total) by mouth every 6 (six) hours as needed (Nausea or vomiting). 09/30/14  Yes Nicholas Lose, MD  saccharomyces boulardii (FLORASTOR) 250 MG capsule Take 1 capsule (250 mg total) by mouth 2 (two) times daily. 10/24/14  Yes Hosie Poisson, MD  sodium chloride (OCEAN) 0.65 % SOLN nasal spray Place 2 sprays into both nostrils as needed for congestion.   Yes Historical Provider, MD  meclizine (ANTIVERT) 25 MG tablet Take 12.5-25 mg  by mouth 3 (three) times daily as needed for dizziness.    Historical Provider, MD  oxyCODONE-acetaminophen (ROXICET) 5-325 MG per tablet Take 1-2 tablets by mouth every 4 (four) hours as needed. 10/15/14   Susanne Borders, NP   BP 126/77 mmHg  Pulse 92  Temp(Src) 98.4 F (36.9 C) (Oral)  Resp 16  SpO2 100% Physical Exam  Constitutional: She is oriented to person, place, and time. She appears well-developed and well-nourished.  HENT:  Head: Normocephalic.  Eyes: EOM are normal.  Neck: Normal range of motion.  Pulmonary/Chest: Effort normal.  Abdominal: She exhibits no distension.  Neulasta on Pro attached to her right lower quadrant  Musculoskeletal: Normal range of motion.  Neurological: She is alert and oriented to person, place, and time.  Psychiatric: She has a normal mood and affect.  Nursing note and vitals reviewed.   ED Course  Procedures (including critical care time) Labs Review Labs Reviewed - No data to display  Imaging Review No results found.   EKG Interpretation None      MDM   Final diagnoses:  Breast cancer, unspecified laterality    I spoke with oncology, Dr. Alvy Bimler, who recommended removal of the device and follow-up in the oncology clinic in the morning.  No additional medications recommended in the ER tonight.    Jola Schmidt, MD 11/26/14 2009

## 2014-11-26 NOTE — ED Notes (Signed)
Patient has a device implanted in RLQ that she receives medication in following chemotherapy.  She reports that the needle came out and she did not get the medication that she was supposed to receive.  Patient is unsure of medication name, but states that it is used to prevent sickness following chemo.

## 2014-11-26 NOTE — ED Notes (Signed)
Pt states that she was given "the stuff they give you after chemo to make sure you don't get an infection" and "it came out".  Pt had last chemo yesterday.  No other complaints.

## 2014-11-27 ENCOUNTER — Other Ambulatory Visit: Payer: Self-pay

## 2014-11-27 ENCOUNTER — Ambulatory Visit (HOSPITAL_BASED_OUTPATIENT_CLINIC_OR_DEPARTMENT_OTHER): Payer: Medicare Other

## 2014-11-27 ENCOUNTER — Telehealth: Payer: Self-pay | Admitting: Hematology and Oncology

## 2014-11-27 DIAGNOSIS — C50411 Malignant neoplasm of upper-outer quadrant of right female breast: Secondary | ICD-10-CM

## 2014-11-27 DIAGNOSIS — Z5189 Encounter for other specified aftercare: Secondary | ICD-10-CM

## 2014-11-27 MED ORDER — PEGFILGRASTIM INJECTION 6 MG/0.6ML ~~LOC~~
6.0000 mg | PREFILLED_SYRINGE | Freq: Once | SUBCUTANEOUS | Status: DC
  Administered 2014-11-27: 6 mg via SUBCUTANEOUS
  Filled 2014-11-27: qty 0.6

## 2014-11-27 NOTE — Telephone Encounter (Signed)
Pt came in to have injection Neulasta due to pro failded .Marland Kitchen... KJ

## 2014-12-02 ENCOUNTER — Ambulatory Visit (HOSPITAL_BASED_OUTPATIENT_CLINIC_OR_DEPARTMENT_OTHER): Payer: Medicare Other

## 2014-12-02 VITALS — BP 109/67 | HR 78 | Temp 99.1°F | Resp 16

## 2014-12-02 DIAGNOSIS — C50411 Malignant neoplasm of upper-outer quadrant of right female breast: Secondary | ICD-10-CM

## 2014-12-02 DIAGNOSIS — Z452 Encounter for adjustment and management of vascular access device: Secondary | ICD-10-CM | POA: Diagnosis not present

## 2014-12-02 DIAGNOSIS — E860 Dehydration: Secondary | ICD-10-CM | POA: Diagnosis not present

## 2014-12-02 DIAGNOSIS — Z95828 Presence of other vascular implants and grafts: Secondary | ICD-10-CM

## 2014-12-02 MED ORDER — SODIUM CHLORIDE 0.9 % IV SOLN
Freq: Once | INTRAVENOUS | Status: AC
  Administered 2014-12-02: 08:00:00 via INTRAVENOUS

## 2014-12-02 MED ORDER — HEPARIN SOD (PORK) LOCK FLUSH 100 UNIT/ML IV SOLN
500.0000 [IU] | Freq: Once | INTRAVENOUS | Status: AC
  Administered 2014-12-02: 500 [IU] via INTRAVENOUS
  Filled 2014-12-02: qty 5

## 2014-12-02 MED ORDER — SODIUM CHLORIDE 0.9 % IJ SOLN
10.0000 mL | INTRAMUSCULAR | Status: DC | PRN
  Administered 2014-12-02: 10 mL via INTRAVENOUS
  Filled 2014-12-02: qty 10

## 2014-12-02 NOTE — Patient Instructions (Signed)
Dehydration, Adult Dehydration is when you lose more fluids from the body than you take in. Vital organs like the kidneys, brain, and heart cannot function without a proper amount of fluids and salt. Any loss of fluids from the body can cause dehydration.  CAUSES   Vomiting.  Diarrhea.  Excessive sweating.  Excessive urine output.  Fever. SYMPTOMS  Mild dehydration  Thirst.  Dry lips.  Slightly dry mouth. Moderate dehydration  Very dry mouth.  Sunken eyes.  Skin does not bounce back quickly when lightly pinched and released.  Dark urine and decreased urine production.  Decreased tear production.  Headache. Severe dehydration  Very dry mouth.  Extreme thirst.  Rapid, weak pulse (more than 100 beats per minute at rest).  Cold hands and feet.  Not able to sweat in spite of heat and temperature.  Rapid breathing.  Blue lips.  Confusion and lethargy.  Difficulty being awakened.  Minimal urine production.  No tears. DIAGNOSIS  Your caregiver will diagnose dehydration based on your symptoms and your exam. Blood and urine tests will help confirm the diagnosis. The diagnostic evaluation should also identify the cause of dehydration. TREATMENT  Treatment of mild or moderate dehydration can often be done at home by increasing the amount of fluids that you drink. It is best to drink small amounts of fluid more often. Drinking too much at one time can make vomiting worse. Refer to the home care instructions below. Severe dehydration needs to be treated at the hospital where you will probably be given intravenous (IV) fluids that contain water and electrolytes. HOME CARE INSTRUCTIONS   Ask your caregiver about specific rehydration instructions.  Drink enough fluids to keep your urine clear or pale yellow.  Drink small amounts frequently if you have nausea and vomiting.  Eat as you normally do.  Avoid:  Foods or drinks high in sugar.  Carbonated  drinks.  Juice.  Extremely hot or cold fluids.  Drinks with caffeine.  Fatty, greasy foods.  Alcohol.  Tobacco.  Overeating.  Gelatin desserts.  Wash your hands well to avoid spreading bacteria and viruses.  Only take over-the-counter or prescription medicines for pain, discomfort, or fever as directed by your caregiver.  Ask your caregiver if you should continue all prescribed and over-the-counter medicines.  Keep all follow-up appointments with your caregiver. SEEK MEDICAL CARE IF:  You have abdominal pain and it increases or stays in one area (localizes).  You have a rash, stiff neck, or severe headache.  You are irritable, sleepy, or difficult to awaken.  You are weak, dizzy, or extremely thirsty. SEEK IMMEDIATE MEDICAL CARE IF:   You are unable to keep fluids down or you get worse despite treatment.  You have frequent episodes of vomiting or diarrhea.  You have blood or green matter (bile) in your vomit.  You have blood in your stool or your stool looks black and tarry.  You have not urinated in 6 to 8 hours, or you have only urinated a small amount of very dark urine.  You have a fever.  You faint. MAKE SURE YOU:   Understand these instructions.  Will watch your condition.  Will get help right away if you are not doing well or get worse. Document Released: 05/02/2005 Document Revised: 07/25/2011 Document Reviewed: 12/20/2010 Exit[employer] Patient Information 2015 [employer]. This information is not intended to replace advice given to you by your health care provider. Make sure you discuss any questions you have with your health care   provider.  

## 2014-12-16 ENCOUNTER — Encounter: Payer: Self-pay | Admitting: General Practice

## 2014-12-16 ENCOUNTER — Telehealth: Payer: Self-pay | Admitting: Hematology and Oncology

## 2014-12-16 ENCOUNTER — Other Ambulatory Visit (HOSPITAL_BASED_OUTPATIENT_CLINIC_OR_DEPARTMENT_OTHER): Payer: Medicare Other

## 2014-12-16 ENCOUNTER — Ambulatory Visit: Payer: Medicare Other

## 2014-12-16 ENCOUNTER — Ambulatory Visit (HOSPITAL_BASED_OUTPATIENT_CLINIC_OR_DEPARTMENT_OTHER): Payer: Medicare Other | Admitting: Hematology and Oncology

## 2014-12-16 ENCOUNTER — Encounter: Payer: Self-pay | Admitting: Hematology and Oncology

## 2014-12-16 ENCOUNTER — Ambulatory Visit (HOSPITAL_BASED_OUTPATIENT_CLINIC_OR_DEPARTMENT_OTHER): Payer: Medicare Other

## 2014-12-16 VITALS — BP 137/84 | HR 62 | Temp 98.8°F | Resp 18 | Ht 60.0 in | Wt 165.5 lb

## 2014-12-16 DIAGNOSIS — D6481 Anemia due to antineoplastic chemotherapy: Secondary | ICD-10-CM | POA: Diagnosis not present

## 2014-12-16 DIAGNOSIS — Z171 Estrogen receptor negative status [ER-]: Secondary | ICD-10-CM | POA: Diagnosis not present

## 2014-12-16 DIAGNOSIS — N390 Urinary tract infection, site not specified: Secondary | ICD-10-CM

## 2014-12-16 DIAGNOSIS — D701 Agranulocytosis secondary to cancer chemotherapy: Secondary | ICD-10-CM

## 2014-12-16 DIAGNOSIS — T451X5A Adverse effect of antineoplastic and immunosuppressive drugs, initial encounter: Secondary | ICD-10-CM

## 2014-12-16 DIAGNOSIS — C773 Secondary and unspecified malignant neoplasm of axilla and upper limb lymph nodes: Secondary | ICD-10-CM | POA: Diagnosis not present

## 2014-12-16 DIAGNOSIS — C50411 Malignant neoplasm of upper-outer quadrant of right female breast: Secondary | ICD-10-CM

## 2014-12-16 DIAGNOSIS — Z5111 Encounter for antineoplastic chemotherapy: Secondary | ICD-10-CM

## 2014-12-16 DIAGNOSIS — E860 Dehydration: Secondary | ICD-10-CM

## 2014-12-16 DIAGNOSIS — E876 Hypokalemia: Secondary | ICD-10-CM

## 2014-12-16 DIAGNOSIS — Z95828 Presence of other vascular implants and grafts: Secondary | ICD-10-CM

## 2014-12-16 LAB — CBC WITH DIFFERENTIAL/PLATELET
BASO%: 1 % (ref 0.0–2.0)
Basophils Absolute: 0.1 10*3/uL (ref 0.0–0.1)
EOS ABS: 0 10*3/uL (ref 0.0–0.5)
EOS%: 0 % (ref 0.0–7.0)
HCT: 29.6 % — ABNORMAL LOW (ref 34.8–46.6)
HGB: 9.5 g/dL — ABNORMAL LOW (ref 11.6–15.9)
LYMPH%: 12.5 % — AB (ref 14.0–49.7)
MCH: 27 pg (ref 25.1–34.0)
MCHC: 32.2 g/dL (ref 31.5–36.0)
MCV: 83.9 fL (ref 79.5–101.0)
MONO#: 0.6 10*3/uL (ref 0.1–0.9)
MONO%: 9.4 % (ref 0.0–14.0)
NEUT#: 4.7 10*3/uL (ref 1.5–6.5)
NEUT%: 77.1 % — AB (ref 38.4–76.8)
Platelets: 277 10*3/uL (ref 145–400)
RBC: 3.53 10*6/uL — ABNORMAL LOW (ref 3.70–5.45)
RDW: 22.3 % — AB (ref 11.2–14.5)
WBC: 6.1 10*3/uL (ref 3.9–10.3)
lymph#: 0.8 10*3/uL — ABNORMAL LOW (ref 0.9–3.3)

## 2014-12-16 LAB — COMPREHENSIVE METABOLIC PANEL (CC13)
ALBUMIN: 3.1 g/dL — AB (ref 3.5–5.0)
ALT: 11 U/L (ref 0–55)
AST: 13 U/L (ref 5–34)
Alkaline Phosphatase: 71 U/L (ref 40–150)
Anion Gap: 8 mEq/L (ref 3–11)
BUN: 10.1 mg/dL (ref 7.0–26.0)
CALCIUM: 8.8 mg/dL (ref 8.4–10.4)
CO2: 28 mEq/L (ref 22–29)
Chloride: 103 mEq/L (ref 98–109)
Creatinine: 0.7 mg/dL (ref 0.6–1.1)
EGFR: 82 mL/min/{1.73_m2} — AB (ref >90)
Glucose: 109 mg/dl (ref 70–140)
Potassium: 3.3 mEq/L — ABNORMAL LOW (ref 3.5–5.1)
Sodium: 139 mEq/L (ref 136–145)
Total Bilirubin: 0.27 mg/dL (ref 0.20–1.20)
Total Protein: 6.4 g/dL (ref 6.4–8.3)

## 2014-12-16 MED ORDER — PALONOSETRON HCL INJECTION 0.25 MG/5ML
0.2500 mg | Freq: Once | INTRAVENOUS | Status: AC
  Administered 2014-12-16: 0.25 mg via INTRAVENOUS

## 2014-12-16 MED ORDER — SODIUM CHLORIDE 0.9 % IJ SOLN
10.0000 mL | INTRAMUSCULAR | Status: DC | PRN
  Administered 2014-12-16: 10 mL
  Filled 2014-12-16: qty 10

## 2014-12-16 MED ORDER — HEPARIN SOD (PORK) LOCK FLUSH 100 UNIT/ML IV SOLN
500.0000 [IU] | Freq: Once | INTRAVENOUS | Status: AC | PRN
  Administered 2014-12-16: 500 [IU]
  Filled 2014-12-16: qty 5

## 2014-12-16 MED ORDER — PEGFILGRASTIM 6 MG/0.6ML ~~LOC~~ PSKT
6.0000 mg | PREFILLED_SYRINGE | Freq: Once | SUBCUTANEOUS | Status: AC
  Administered 2014-12-16: 6 mg via SUBCUTANEOUS
  Filled 2014-12-16: qty 0.6

## 2014-12-16 MED ORDER — SODIUM CHLORIDE 0.9 % IV SOLN
Freq: Once | INTRAVENOUS | Status: AC
  Administered 2014-12-16: 13:00:00 via INTRAVENOUS

## 2014-12-16 MED ORDER — DOCETAXEL CHEMO INJECTION 160 MG/16ML
60.0000 mg/m2 | Freq: Once | INTRAVENOUS | Status: AC
  Administered 2014-12-16: 110 mg via INTRAVENOUS
  Filled 2014-12-16: qty 11

## 2014-12-16 MED ORDER — FOSAPREPITANT DIMEGLUMINE INJECTION 150 MG
Freq: Once | INTRAVENOUS | Status: AC
  Administered 2014-12-16: 13:00:00 via INTRAVENOUS
  Filled 2014-12-16: qty 5

## 2014-12-16 MED ORDER — SODIUM CHLORIDE 0.9 % IV SOLN
500.0000 mg/m2 | Freq: Once | INTRAVENOUS | Status: AC
  Administered 2014-12-16: 900 mg via INTRAVENOUS
  Filled 2014-12-16: qty 45

## 2014-12-16 MED ORDER — PALONOSETRON HCL INJECTION 0.25 MG/5ML
INTRAVENOUS | Status: AC
  Filled 2014-12-16: qty 5

## 2014-12-16 MED ORDER — SODIUM CHLORIDE 0.9 % IJ SOLN
10.0000 mL | INTRAMUSCULAR | Status: DC | PRN
  Administered 2014-12-16: 10 mL via INTRAVENOUS
  Filled 2014-12-16: qty 10

## 2014-12-16 NOTE — Patient Instructions (Signed)

## 2014-12-16 NOTE — Progress Notes (Signed)
Patient Care Team: Gaynelle Arabian, MD as PCP - General (Family Medicine) Jerline Pain, MD as Consulting Physician (Cardiology) Wonda Horner, MD as Consulting Physician (Gastroenterology) Ralene Ok, MD as Consulting Physician (Surgery)  DIAGNOSIS: No matching staging information was found for the patient.  SUMMARY OF ONCOLOGIC HISTORY:   Breast cancer of upper-outer quadrant of right female breast   07/30/2014 Mammogram Right breast 10 o'clock location 4 cm from the nipple measuring 1.7 x 1.3 x 1.3 cm. This corresponds to the palpable and mammographic finding. In the right axilla, there are several lymph nodes which are normal in size measuring up to 52mm.   08/27/2014 Initial Diagnosis Right Breast 10:00: IDC with LVI, Grade 3, Er 0%, PR 0%, Her 2 Neg, Ki 67: 30%   09/22/2014 Surgery Rt Lumpectomy: IDC grade 3, Size 2.6 cm, with HG DCIS, LVI present, ER/PR: 0%; 1/2 LN Positive: T2N1M0 Stage 2B   10/15/2014 -  Chemotherapy Adjuvant chemotherapy with Taxotere Cytoxan 4 q3 weeks   10/21/2014 - 10/24/2014 Hospital Admission Admission for neutropenic fever UTI Klebsiella    CHIEF COMPLIANT: Cycle 4 adjuvant chemotherapy  INTERVAL HISTORY: JULIETA ROGALSKI is a 69 year old with above-mentioned history of right breast cancer who is here to receive her last and final cycle of adjuvant chemotherapy with Taxotere and Cytoxan. She has actually done remarkably well from chemotherapy standpoint she did not have any nausea vomiting. She denies any neuropathy. She does not have any problems with her bowel or bladder function. She did have urinary infection for which she is on antibiotics currently with nitrofurantoin. She reports her husband is going to have lung surgery next week.  REVIEW OF SYSTEMS:   Constitutional: Denies fevers, chills or abnormal weight loss Eyes: Denies blurriness of vision Ears, nose, mouth, throat, and face: Denies mucositis or sore throat Respiratory: Denies cough, dyspnea or  wheezes Cardiovascular: Denies palpitation, chest discomfort or lower extremity swelling Gastrointestinal:  Denies nausea, heartburn or change in bowel habits Skin: Denies abnormal skin rashes Lymphatics: Denies new lymphadenopathy or easy bruising Neurological: Mild weakness uses a cane Behavioral/Psych: Mood is stable, no new changes  All other systems were reviewed with the patient and are negative.  I have reviewed the past medical history, past surgical history, social history and family history with the patient and they are unchanged from previous note.  ALLERGIES:  is allergic to shellfish allergy; lactose intolerance (gi); aleve; aspirin; ciprofloxacin; doxycycline; evista; fosamax; pravachol; sulfa antibiotics; welchol; and zocor.  MEDICATIONS:  Current Outpatient Prescriptions  Medication Sig Dispense Refill  . amoxicillin-clavulanate (AUGMENTIN) 875-125 MG per tablet Take 1 tablet by mouth every 12 (twelve) hours. For 10 Days. ABT Start Date 11/24/14 & 12/03/14.    . calcium carbonate (OS-CAL) 600 MG TABS Take 600 mg by mouth 2 (two) times daily with a meal.    . carboxymethylcellulose (REFRESH PLUS) 0.5 % SOLN 1 drop 4 (four) times daily.     . Cholecalciferol (VITAMIN D) 2000 UNITS tablet Take 2,000 Units by mouth 2 (two) times daily.     Marland Kitchen dexamethasone (DECADRON) 4 MG tablet Take 1 tablet (4 mg total) by mouth 2 (two) times daily. Start the day before Taxotere. Then again the day after chemo for 3 days. 30 tablet 1  . dexlansoprazole (DEXILANT) 60 MG capsule Take 60 mg by mouth daily.    . ferrous sulfate 325 (65 FE) MG tablet Take 1 tablet (325 mg total) by mouth 2 (two) times daily with a meal. 30  tablet 3  . fexofenadine (ALLEGRA) 180 MG tablet Take 180 mg by mouth daily.    . fluconazole (DIFLUCAN) 150 MG tablet TAKE 1 TABLET BY MOUTH TODAY AND THEN REPEAT IN 3-5 DAYS  0  . fluconazole (DIFLUCAN) 200 MG tablet Take 1 tablet (200 mg total) by mouth daily. 5 tablet 0  .  FLUoxetine (PROZAC) 20 MG capsule Take 20 mg by mouth daily.  11  . gabapentin (NEURONTIN) 300 MG capsule Take 300 mg by mouth at bedtime.     . lidocaine-prilocaine (EMLA) cream Apply to affected area once 30 g 3  . LORazepam (ATIVAN) 0.5 MG tablet Take 1 tablet (0.5 mg total) by mouth at bedtime. 30 tablet 0  . lovastatin (MEVACOR) 40 MG tablet Take 40 mg by mouth daily before breakfast.     . meclizine (ANTIVERT) 25 MG tablet Take 12.5-25 mg by mouth 3 (three) times daily as needed for dizziness.    . meloxicam (MOBIC) 15 MG tablet Take 15 mg by mouth daily.     . nitrofurantoin, macrocrystal-monohydrate, (MACROBID) 100 MG capsule TAKE 1 CAPSULE WITH FOOD EVERY 12 HRS ORALLY 10 DAYS  0  . nystatin (MYCOSTATIN) 100000 UNIT/ML suspension Take 5 mLs (500,000 Units total) by mouth 4 (four) times daily. 60 mL 0  . ondansetron (ZOFRAN) 8 MG tablet Take 1 tablet (8 mg total) by mouth 2 (two) times daily. Start the day after chemo for 3 days. Then take as needed for nausea or vomiting. 30 tablet 1  . oxyCODONE-acetaminophen (ROXICET) 5-325 MG per tablet Take 1-2 tablets by mouth every 4 (four) hours as needed. 30 tablet 0  . polyethylene glycol (MIRALAX / GLYCOLAX) packet Take 17 g by mouth as needed for mild constipation.     Marland Kitchen PRESCRIPTION MEDICATION Chemo; [place].    Marland Kitchen prochlorperazine (COMPAZINE) 10 MG tablet Take 1 tablet (10 mg total) by mouth every 6 (six) hours as needed (Nausea or vomiting). 30 tablet 1  . saccharomyces boulardii (FLORASTOR) 250 MG capsule Take 1 capsule (250 mg total) by mouth 2 (two) times daily. 30 capsule 0  . sodium chloride (OCEAN) 0.65 % SOLN nasal spray Place 2 sprays into both nostrils as needed for congestion.     No current facility-administered medications for this visit.    PHYSICAL EXAMINATION: ECOG PERFORMANCE STATUS: 1 - Symptomatic but completely ambulatory  Filed Vitals:   12/16/14 1118  BP: 137/84  Pulse: 62  Temp: 98.8 F (37.1 C)  Resp: 18    Filed Weights   12/16/14 1118  Weight: 165 lb 8 oz (75.07 kg)    GENERAL:alert, no distress and comfortable SKIN: skin color, texture, turgor are normal, no rashes or significant lesions EYES: normal, Conjunctiva are pink and non-injected, sclera clear OROPHARYNX:no exudate, no erythema and lips, buccal mucosa, and tongue normal  NECK: supple, thyroid normal size, non-tender, without nodularity LYMPH:  no palpable lymphadenopathy in the cervical, axillary or inguinal LUNGS: clear to auscultation and percussion with normal breathing effort HEART: regular rate & rhythm and no murmurs and no lower extremity edema ABDOMEN:abdomen soft, non-tender and normal bowel sounds Musculoskeletal:no cyanosis of digits and no clubbing  NEURO: alert & oriented x 3 with fluent speech, no focal motor/sensory deficits  LABORATORY DATA:  I have reviewed the data as listed   Chemistry      Component Value Date/Time   NA 139 11/25/2014 1036   NA 138 10/22/2014 0504   K 3.8 11/25/2014 1036   K 3.8  10/22/2014 0504   CL 104 10/22/2014 0504   CO2 26 11/25/2014 1036   CO2 26 10/22/2014 0504   BUN 14.3 11/25/2014 1036   BUN 9 10/22/2014 0504   CREATININE 0.8 11/25/2014 1036   CREATININE 0.64 10/22/2014 0504      Component Value Date/Time   CALCIUM 9.5 11/25/2014 1036   CALCIUM 8.3* 10/22/2014 0504   ALKPHOS 76 11/25/2014 1036   ALKPHOS 67 10/21/2014 1723   AST 13 11/25/2014 1036   AST 12* 10/21/2014 1723   ALT 11 11/25/2014 1036   ALT 11* 10/21/2014 1723   BILITOT 0.28 11/25/2014 1036   BILITOT 0.9 10/21/2014 1723       Lab Results  Component Value Date   WBC 6.1 12/16/2014   HGB 9.5* 12/16/2014   HCT 29.6* 12/16/2014   MCV 83.9 12/16/2014   PLT 277 12/16/2014   NEUTROABS 4.7 12/16/2014    ASSESSMENT & PLAN:  Breast cancer of upper-outer quadrant of right female breast Rt Lumpectomy: IDC grade 3, Size 2.6 cm, with HG DCIS, LVI present, ER/PR: 0% Ki 67: 30%; 1/2 LN Positive: T2N1M0  Stage 2B Pathological stage  Treatment plan: 1. Adjuvant chemo with TC X 4 (I chose his regimen because patient appears to be slightly older than her stated age and I'm concerned about toxicity to standard full dose chemotherapy with Adriamycin and Cytoxan followed by Taxol. In addition patient is also very reluctant to consider aggressive chemotherapy because she felt that her sister died because of toxicities of chemotherapy.) 2. Adjuvant XRT  Current treatment: Cycle 4 day 1 (Last cycle) (started cycle 1 on 10/15/2014) of Taxotere Cytoxan Chemotherapy toxicities: 1. Oral thrush: Nystatin swish and swallow resolved 2. Clinical dehydration: After each cycle, required IV fluids 3. Severe fatigue: Decreased the dosage of chemotherapy for cycle 2 4. Neutropenic fever: After cycle 1 of chemotherapy hospitalization with Klebsiella urinary tract infection treated with Keflex 5. Urinary tract infection currently on antibiotic  Anemia due to chemotherapy: Currently on iron supplementation. Hemoglobin 9.5  Dehydration: Much improved Treatment plan: 1. I will make a referral to radiation oncology for adjuvant radiation therapy. 2. There is no role of antiestrogen therapy since she is ER/PR negative.   Return to clinic in 3 months for follow-up     No orders of the defined types were placed in this encounter.   The patient has a good understanding of the overall plan. she agrees with it. she will call with any problems that may develop before the next visit here.   Rulon Eisenmenger, MD

## 2014-12-16 NOTE — Telephone Encounter (Signed)
Gave avs & calendar for August/ November

## 2014-12-16 NOTE — Assessment & Plan Note (Signed)
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 Adjuvant chemo with TC X 4 (I chose his regimen because patient appears to be slightly older than her stated age and I'm concerned about toxicity to standard full dose chemotherapy with Adriamycin and Cytoxan followed by Taxol. In addition patient is also very reluctant to consider aggressive chemotherapy because she felt that her sister died because of toxicities of chemotherapy.) 2. Adjuvant XRT  Current treatment: Cycle 4 day 1 (Last cycle) (started cycle 1 on 10/15/2014) of Taxotere Cytoxan Chemotherapy toxicities: 1. Oral thrush: Nystatin swish and swallow resolved 2. Clinical dehydration: After cycle 1 and 2, required IV fluids 3. Severe fatigue: Decreased the dosage of chemotherapy for cycle 2 4. Neutropenic fever: After cycle 1 of chemotherapy hospitalization with Klebsiella urinary tract infection treated with Keflex 5. Urinary tract infection currently and antibody  Anemia: Currently on iron supplementation her hemoglobin is improved to 10.3. Knee injury: Doing better today  Dehydration: Much improved Treatment plan: 1. I will make a referral to radiation oncology for adjuvant radiation therapy. 2. There is no role of antiestrogen therapy since she is ER/PR negative.   Return to clinic in 3 months for follow-up

## 2014-12-16 NOTE — Patient Instructions (Signed)
[facility] Discharge Instructions for Patients Receiving Chemotherapy  Today you received the following chemotherapy agents taxotere/cytoxan.   To help prevent nausea and vomiting after your treatment, we encourage you to take your nausea medication as directed.    If you develop nausea and vomiting that is not controlled by your nausea medication, call the clinic.   BELOW ARE SYMPTOMS THAT SHOULD BE REPORTED IMMEDIATELY:  *FEVER GREATER THAN 100.5 F  *CHILLS WITH OR WITHOUT FEVER  NAUSEA AND VOMITING THAT IS NOT CONTROLLED WITH YOUR NAUSEA MEDICATION  *UNUSUAL SHORTNESS OF BREATH  *UNUSUAL BRUISING OR BLEEDING  TENDERNESS IN MOUTH AND THROAT WITH OR WITHOUT PRESENCE OF ULCERS  *URINARY PROBLEMS  *BOWEL PROBLEMS  UNUSUAL RASH Items with * indicate a potential emergency and should be followed up as soon as possible.  Feel free to call the clinic you have any questions or concerns. The clinic phone number is [phone].  

## 2014-12-17 NOTE — Progress Notes (Signed)
Spiritual Care Note  Spoke at length with Angela Bond in infusion regarding how she is coping.  She was in buoyant spirits as she prepared to ring the bell (celebrating last chemo), awaits her husband's long-needed lung surgery Monday, and experiences relief at her husband's recent growth in faith.  Provided pastoral presence, reflective listening, and encouragement as she and her husband prepare for next steps in their healthcare.  Also provided Christian-based coloring sheets per her verbalized interest. She is aware of and values ongoing chaplain availability.  Please also page as needs arise.  Thank you.  [place], [place] Pager [phone] Voicemail  [phone]

## 2014-12-23 ENCOUNTER — Encounter (HOSPITAL_COMMUNITY): Payer: Self-pay

## 2014-12-23 ENCOUNTER — Telehealth: Payer: Self-pay | Admitting: *Deleted

## 2014-12-23 ENCOUNTER — Ambulatory Visit (HOSPITAL_BASED_OUTPATIENT_CLINIC_OR_DEPARTMENT_OTHER): Payer: Medicare Other | Admitting: Nurse Practitioner

## 2014-12-23 ENCOUNTER — Ambulatory Visit: Payer: Medicare Other

## 2014-12-23 ENCOUNTER — Other Ambulatory Visit: Payer: Self-pay | Admitting: *Deleted

## 2014-12-23 ENCOUNTER — Inpatient Hospital Stay (HOSPITAL_COMMUNITY)
Admission: AD | Admit: 2014-12-23 | Discharge: 2014-12-25 | DRG: 809 | Disposition: A | Discharge status: Home / Self Care | Payer: Medicare Other | Source: Ambulatory Visit | Attending: Internal Medicine | Admitting: Internal Medicine

## 2014-12-23 ENCOUNTER — Telehealth: Payer: Self-pay | Admitting: Hematology and Oncology

## 2014-12-23 ENCOUNTER — Ambulatory Visit (HOSPITAL_BASED_OUTPATIENT_CLINIC_OR_DEPARTMENT_OTHER): Payer: Medicare Other

## 2014-12-23 VITALS — BP 157/82 | HR 110 | Temp 100.9°F

## 2014-12-23 DIAGNOSIS — Z803 Family history of malignant neoplasm of breast: Secondary | ICD-10-CM | POA: Diagnosis not present

## 2014-12-23 DIAGNOSIS — M8580 Other specified disorders of bone density and structure, unspecified site: Secondary | ICD-10-CM | POA: Diagnosis present

## 2014-12-23 DIAGNOSIS — B029 Zoster without complications: Secondary | ICD-10-CM | POA: Diagnosis present

## 2014-12-23 DIAGNOSIS — M810 Age-related osteoporosis without current pathological fracture: Secondary | ICD-10-CM | POA: Diagnosis present

## 2014-12-23 DIAGNOSIS — I952 Hypotension due to drugs: Secondary | ICD-10-CM | POA: Diagnosis present

## 2014-12-23 DIAGNOSIS — Z79891 Long term (current) use of opiate analgesic: Secondary | ICD-10-CM

## 2014-12-23 DIAGNOSIS — E860 Dehydration: Secondary | ICD-10-CM

## 2014-12-23 DIAGNOSIS — I2510 Atherosclerotic heart disease of native coronary artery without angina pectoris: Secondary | ICD-10-CM | POA: Diagnosis present

## 2014-12-23 DIAGNOSIS — B961 Klebsiella pneumoniae [K. pneumoniae] as the cause of diseases classified elsewhere: Secondary | ICD-10-CM | POA: Diagnosis present

## 2014-12-23 DIAGNOSIS — C50411 Malignant neoplasm of upper-outer quadrant of right female breast: Secondary | ICD-10-CM

## 2014-12-23 DIAGNOSIS — I10 Essential (primary) hypertension: Secondary | ICD-10-CM | POA: Diagnosis present

## 2014-12-23 DIAGNOSIS — E739 Lactose intolerance, unspecified: Secondary | ICD-10-CM | POA: Diagnosis present

## 2014-12-23 DIAGNOSIS — N390 Urinary tract infection, site not specified: Secondary | ICD-10-CM | POA: Diagnosis present

## 2014-12-23 DIAGNOSIS — Z886 Allergy status to analgesic agent status: Secondary | ICD-10-CM

## 2014-12-23 DIAGNOSIS — F419 Anxiety disorder, unspecified: Secondary | ICD-10-CM | POA: Diagnosis present

## 2014-12-23 DIAGNOSIS — D709 Neutropenia, unspecified: Secondary | ICD-10-CM

## 2014-12-23 DIAGNOSIS — R5081 Fever presenting with conditions classified elsewhere: Secondary | ICD-10-CM | POA: Diagnosis present

## 2014-12-23 DIAGNOSIS — Z7952 Long term (current) use of systemic steroids: Secondary | ICD-10-CM | POA: Diagnosis not present

## 2014-12-23 DIAGNOSIS — D6481 Anemia due to antineoplastic chemotherapy: Secondary | ICD-10-CM | POA: Diagnosis present

## 2014-12-23 DIAGNOSIS — Z882 Allergy status to sulfonamides status: Secondary | ICD-10-CM | POA: Diagnosis not present

## 2014-12-23 DIAGNOSIS — Z91013 Allergy to seafood: Secondary | ICD-10-CM | POA: Diagnosis not present

## 2014-12-23 DIAGNOSIS — I4891 Unspecified atrial fibrillation: Secondary | ICD-10-CM | POA: Diagnosis present

## 2014-12-23 DIAGNOSIS — E780 Pure hypercholesterolemia: Secondary | ICD-10-CM | POA: Diagnosis present

## 2014-12-23 DIAGNOSIS — D61810 Antineoplastic chemotherapy induced pancytopenia: Secondary | ICD-10-CM | POA: Diagnosis present

## 2014-12-23 DIAGNOSIS — E785 Hyperlipidemia, unspecified: Secondary | ICD-10-CM | POA: Diagnosis present

## 2014-12-23 DIAGNOSIS — M1990 Unspecified osteoarthritis, unspecified site: Secondary | ICD-10-CM | POA: Diagnosis present

## 2014-12-23 DIAGNOSIS — B373 Candidiasis of vulva and vagina: Secondary | ICD-10-CM | POA: Diagnosis present

## 2014-12-23 DIAGNOSIS — Z96653 Presence of artificial knee joint, bilateral: Secondary | ICD-10-CM | POA: Diagnosis present

## 2014-12-23 DIAGNOSIS — K5900 Constipation, unspecified: Secondary | ICD-10-CM | POA: Diagnosis present

## 2014-12-23 DIAGNOSIS — Z79899 Other long term (current) drug therapy: Secondary | ICD-10-CM

## 2014-12-23 DIAGNOSIS — Z833 Family history of diabetes mellitus: Secondary | ICD-10-CM | POA: Diagnosis not present

## 2014-12-23 DIAGNOSIS — C50919 Malignant neoplasm of unspecified site of unspecified female breast: Secondary | ICD-10-CM

## 2014-12-23 DIAGNOSIS — Z87440 Personal history of urinary (tract) infections: Secondary | ICD-10-CM

## 2014-12-23 DIAGNOSIS — G629 Polyneuropathy, unspecified: Secondary | ICD-10-CM | POA: Diagnosis present

## 2014-12-23 DIAGNOSIS — T451X5A Adverse effect of antineoplastic and immunosuppressive drugs, initial encounter: Secondary | ICD-10-CM | POA: Diagnosis present

## 2014-12-23 DIAGNOSIS — Z8249 Family history of ischemic heart disease and other diseases of the circulatory system: Secondary | ICD-10-CM

## 2014-12-23 DIAGNOSIS — R509 Fever, unspecified: Secondary | ICD-10-CM | POA: Diagnosis present

## 2014-12-23 DIAGNOSIS — Z881 Allergy status to other antibiotic agents status: Secondary | ICD-10-CM

## 2014-12-23 DIAGNOSIS — Z888 Allergy status to other drugs, medicaments and biological substances status: Secondary | ICD-10-CM

## 2014-12-23 DIAGNOSIS — I480 Paroxysmal atrial fibrillation: Secondary | ICD-10-CM | POA: Diagnosis present

## 2014-12-23 DIAGNOSIS — F329 Major depressive disorder, single episode, unspecified: Secondary | ICD-10-CM | POA: Diagnosis present

## 2014-12-23 DIAGNOSIS — F32A Depression, unspecified: Secondary | ICD-10-CM | POA: Diagnosis present

## 2014-12-23 DIAGNOSIS — G8929 Other chronic pain: Secondary | ICD-10-CM | POA: Diagnosis present

## 2014-12-23 DIAGNOSIS — K449 Diaphragmatic hernia without obstruction or gangrene: Secondary | ICD-10-CM

## 2014-12-23 DIAGNOSIS — K219 Gastro-esophageal reflux disease without esophagitis: Secondary | ICD-10-CM | POA: Diagnosis present

## 2014-12-23 DIAGNOSIS — Z808 Family history of malignant neoplasm of other organs or systems: Secondary | ICD-10-CM

## 2014-12-23 LAB — URINALYSIS, MICROSCOPIC - CHCC
Bilirubin (Urine): NEGATIVE
Glucose: NEGATIVE mg/dL
KETONES: NEGATIVE mg/dL
NITRITE: NEGATIVE
Specific Gravity, Urine: 1.005 (ref 1.003–1.035)
Urobilinogen, UR: 0.2 mg/dL (ref 0.2–1)
pH: 8 (ref 4.6–8.0)

## 2014-12-23 LAB — COMPREHENSIVE METABOLIC PANEL (CC13)
ALT: 13 U/L (ref 0–55)
AST: 12 U/L (ref 5–34)
Albumin: 3 g/dL — ABNORMAL LOW (ref 3.5–5.0)
Alkaline Phosphatase: 73 U/L (ref 40–150)
Anion Gap: 8 meq/L (ref 3–11)
BUN: 6.9 mg/dL — ABNORMAL LOW (ref 7.0–26.0)
CO2: 29 meq/L (ref 22–29)
Calcium: 8.6 mg/dL (ref 8.4–10.4)
Chloride: 102 meq/L (ref 98–109)
Creatinine: 0.7 mg/dL (ref 0.6–1.1)
EGFR: 88 ml/min/1.73 m2 — ABNORMAL LOW
Glucose: 107 mg/dL (ref 70–140)
Potassium: 3.5 meq/L (ref 3.5–5.1)
Sodium: 140 meq/L (ref 136–145)
Total Bilirubin: 0.71 mg/dL (ref 0.20–1.20)
Total Protein: 6 g/dL — ABNORMAL LOW (ref 6.4–8.3)

## 2014-12-23 LAB — CBC
HCT: 25.6 % — ABNORMAL LOW (ref 36.0–46.0)
Hemoglobin: 8.1 g/dL — ABNORMAL LOW (ref 12.0–15.0)
MCH: 27.3 pg (ref 26.0–34.0)
MCHC: 31.6 g/dL (ref 30.0–36.0)
MCV: 86.2 fL (ref 78.0–100.0)
PLATELETS: 125 10*3/uL — AB (ref 150–400)
RBC: 2.97 MIL/uL — AB (ref 3.87–5.11)
RDW: 19 % — ABNORMAL HIGH (ref 11.5–15.5)
WBC: 2.5 10*3/uL — AB (ref 4.0–10.5)

## 2014-12-23 LAB — CBC WITH DIFFERENTIAL/PLATELET
BASO%: 1 % (ref 0.0–2.0)
Basophils Absolute: 0 10e3/uL (ref 0.0–0.1)
EOS%: 3 % (ref 0.0–7.0)
Eosinophils Absolute: 0.1 10e3/uL (ref 0.0–0.5)
HCT: 27.7 % — ABNORMAL LOW (ref 34.8–46.6)
HGB: 9.1 g/dL — ABNORMAL LOW (ref 11.6–15.9)
LYMPH%: 19.2 % (ref 14.0–49.7)
MCH: 27.5 pg (ref 25.1–34.0)
MCHC: 32.7 g/dL (ref 31.5–36.0)
MCV: 84.1 fL (ref 79.5–101.0)
MONO#: 0.5 10e3/uL (ref 0.1–0.9)
MONO%: 32.4 % — ABNORMAL HIGH (ref 0.0–14.0)
NEUT#: 0.7 10e3/uL — ABNORMAL LOW (ref 1.5–6.5)
NEUT%: 44.4 % (ref 38.4–76.8)
Platelets: 121 10e3/uL — ABNORMAL LOW (ref 145–400)
RBC: 3.29 10e6/uL — ABNORMAL LOW (ref 3.70–5.45)
RDW: 20.9 % — ABNORMAL HIGH (ref 11.2–14.5)
WBC: 1.7 10e3/uL — ABNORMAL LOW (ref 3.9–10.3)
lymph#: 0.3 10e3/uL — ABNORMAL LOW (ref 0.9–3.3)

## 2014-12-23 LAB — STREP PNEUMONIAE URINARY ANTIGEN: Strep Pneumo Urinary Antigen: NEGATIVE

## 2014-12-23 LAB — CREATININE, SERUM
Creatinine, Ser: 0.7 mg/dL (ref 0.44–1.00)
GFR calc Af Amer: >60 mL/min (ref >60)
GFR calc non Af Amer: >60 mL/min (ref >60)

## 2014-12-23 MED ORDER — PIPERACILLIN-TAZOBACTAM 3.375 G IVPB
3.3750 g | Freq: Three times a day (TID) | INTRAVENOUS | Status: DC
  Administered 2014-12-24 – 2014-12-25 (×2): 3.375 g via INTRAVENOUS
  Filled 2014-12-23 (×3): qty 50

## 2014-12-23 MED ORDER — SODIUM CHLORIDE 0.9 % IV SOLN
INTRAVENOUS | Status: DC
  Administered 2014-12-23 – 2014-12-24 (×3): via INTRAVENOUS

## 2014-12-23 MED ORDER — SODIUM CHLORIDE 0.9 % IV SOLN
INTRAVENOUS | Status: AC
  Administered 2014-12-23: 11:00:00 via INTRAVENOUS

## 2014-12-23 MED ORDER — LORAZEPAM 0.5 MG PO TABS
0.5000 mg | ORAL_TABLET | Freq: Every day | ORAL | Status: DC
  Administered 2014-12-23 – 2014-12-24 (×2): 0.5 mg via ORAL
  Filled 2014-12-23 (×2): qty 1

## 2014-12-23 MED ORDER — POLYETHYLENE GLYCOL 3350 17 G PO PACK
17.0000 g | PACK | ORAL | Status: DC | PRN
  Administered 2014-12-24: 17 g via ORAL
  Filled 2014-12-23: qty 1

## 2014-12-23 MED ORDER — PANTOPRAZOLE SODIUM 40 MG PO TBEC
40.0000 mg | DELAYED_RELEASE_TABLET | Freq: Every day | ORAL | Status: DC
  Administered 2014-12-24 – 2014-12-25 (×2): 40 mg via ORAL
  Filled 2014-12-23 (×2): qty 1

## 2014-12-23 MED ORDER — GABAPENTIN 300 MG PO CAPS
300.0000 mg | ORAL_CAPSULE | Freq: Every day | ORAL | Status: DC
  Administered 2014-12-23 – 2014-12-24 (×2): 300 mg via ORAL
  Filled 2014-12-23 (×3): qty 1

## 2014-12-23 MED ORDER — FLUCONAZOLE 200 MG PO TABS
200.0000 mg | ORAL_TABLET | Freq: Every day | ORAL | Status: DC
  Administered 2014-12-23 – 2014-12-25 (×3): 200 mg via ORAL
  Filled 2014-12-23 (×3): qty 1

## 2014-12-23 MED ORDER — PROCHLORPERAZINE MALEATE 10 MG PO TABS: 10.0000 mg | ORAL_TABLET | Freq: Four times a day (QID) | ORAL | Status: DC | PRN

## 2014-12-23 MED ORDER — PIPERACILLIN-TAZOBACTAM 3.375 G IVPB
3.3750 g | INTRAVENOUS | Status: AC
  Administered 2014-12-23: 3.375 g via INTRAVENOUS
  Filled 2014-12-23 (×2): qty 50

## 2014-12-23 MED ORDER — VANCOMYCIN HCL IN DEXTROSE 750-5 MG/150ML-% IV SOLN: 750.0000 mg | Freq: Two times a day (BID) | INTRAVENOUS | Status: DC

## 2014-12-23 MED ORDER — LORATADINE 10 MG PO TABS
10.0000 mg | ORAL_TABLET | Freq: Every day | ORAL | Status: DC
  Administered 2014-12-24 – 2014-12-25 (×2): 10 mg via ORAL
  Filled 2014-12-23 (×2): qty 1

## 2014-12-23 MED ORDER — VANCOMYCIN HCL IN DEXTROSE 750-5 MG/150ML-% IV SOLN
750.0000 mg | INTRAVENOUS | Status: DC
  Filled 2014-12-23: qty 150

## 2014-12-23 MED ORDER — CALCIUM CARBONATE 1250 (500 CA) MG PO TABS
625.0000 mg | ORAL_TABLET | Freq: Two times a day (BID) | ORAL | Status: DC
  Administered 2014-12-23 – 2014-12-24 (×3): 625 mg via ORAL
  Administered 2014-12-25: 1250 mg via ORAL
  Filled 2014-12-23 (×6): qty 0.5

## 2014-12-23 MED ORDER — ONDANSETRON HCL 8 MG PO TABS: 8.0000 mg | ORAL_TABLET | Freq: Two times a day (BID) | ORAL | Status: DC

## 2014-12-23 MED ORDER — OXYCODONE-ACETAMINOPHEN 5-325 MG PO TABS
1.0000 | ORAL_TABLET | ORAL | Status: DC | PRN
  Administered 2014-12-23: 1 via ORAL
  Filled 2014-12-23: qty 1

## 2014-12-23 MED ORDER — FLUOXETINE HCL 20 MG PO CAPS
20.0000 mg | ORAL_CAPSULE | Freq: Every day | ORAL | Status: DC
  Administered 2014-12-24 – 2014-12-25 (×2): 20 mg via ORAL
  Filled 2014-12-23 (×2): qty 1

## 2014-12-23 MED ORDER — SACCHAROMYCES BOULARDII 250 MG PO CAPS
250.0000 mg | ORAL_CAPSULE | Freq: Two times a day (BID) | ORAL | Status: DC
  Administered 2014-12-23 – 2014-12-25 (×4): 250 mg via ORAL
  Filled 2014-12-23 (×5): qty 1

## 2014-12-23 MED ORDER — FERROUS SULFATE 325 (65 FE) MG PO TABS
325.0000 mg | ORAL_TABLET | Freq: Two times a day (BID) | ORAL | Status: DC
  Administered 2014-12-23 – 2014-12-25 (×4): 325 mg via ORAL
  Filled 2014-12-23 (×6): qty 1

## 2014-12-23 MED ORDER — POLYVINYL ALCOHOL 1.4 % OP SOLN
1.0000 [drp] | OPHTHALMIC | Status: DC | PRN
  Administered 2014-12-23: 1 [drp] via OPHTHALMIC
  Filled 2014-12-23: qty 15

## 2014-12-23 MED ORDER — PIPERACILLIN-TAZOBACTAM 3.375 G IVPB: 3.3750 g | Freq: Three times a day (TID) | INTRAVENOUS | Status: DC

## 2014-12-23 MED ORDER — HEPARIN SODIUM (PORCINE) 5000 UNIT/ML IJ SOLN
5000.0000 [IU] | Freq: Three times a day (TID) | INTRAMUSCULAR | Status: DC
  Administered 2014-12-23 – 2014-12-25 (×5): 5000 [IU] via SUBCUTANEOUS
  Filled 2014-12-23 (×8): qty 1

## 2014-12-23 MED ORDER — NYSTATIN 100000 UNIT/ML MT SUSP
5.0000 mL | Freq: Four times a day (QID) | OROMUCOSAL | Status: DC
  Administered 2014-12-23 – 2014-12-25 (×7): 500000 [IU] via ORAL
  Filled 2014-12-23 (×10): qty 5

## 2014-12-23 MED ORDER — MELOXICAM 15 MG PO TABS
15.0000 mg | ORAL_TABLET | Freq: Every day | ORAL | Status: DC
  Administered 2014-12-24 – 2014-12-25 (×2): 15 mg via ORAL
  Filled 2014-12-23 (×2): qty 1

## 2014-12-23 MED ORDER — MECLIZINE HCL 12.5 MG PO TABS
12.5000 mg | ORAL_TABLET | Freq: Three times a day (TID) | ORAL | Status: DC | PRN
  Filled 2014-12-23: qty 2

## 2014-12-23 NOTE — H&P (Signed)
[facility] History and Physical  Angela Bond MIW:[id number] DOB: Oct 08, 1945 DOA: 12/23/2014  Referring physician: Oncology PA PCP: Simona Huh, MD  Specialists: Dr. Lindi Adie made ware via epic  69 y/o ? 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 Wedge biopsy of lung Patient was in the hospital  States she came home late 12/22/14 p.m. felt that she had a fever of 99.7 A.m. of 12/23/14 states had sore throat, sinus pressure, mild cough  Noted temperature of 102.3   Tells me that she has been treated recently until last Sunday with a ten-day course of Macrobid for urinary tract infection  Treated also in the past recently for candidemia vaginitis and seems to have had a recurrent episodes of both pyelonephritis as well as vaginitis since initiation of chemotherapy   Presented to [facility] [place] for chemotherapy XRT and fluids and was referred over to [place] long after [place] noted to be 700 , mild thrombocytopenia platelet count 121, hemoglobin 9.1, WBC 1.7   Note that her counts 12/16/14 = WBC 6.1, hemoglobin 9.5 platelet count 277 portable   chest x-ray question mild bibasilar atelectasis    - diarrhea, - chills, - blurred vision, - double vision, - sputum, - falls, - unilateral weakness, -  N, - vomiting, -diarrhea  + constipation +Weight loss   Past Medical History  Diagnosis Date  . Arthritis   . Hypertension   . GERD (gastroesophageal reflux disease)   . Hypercholesteremia   . Heart murmur   . Anxiety and depression   . H/O hiatal hernia   . Headache(784.0)     sinus   . Dysrhythmia   . Shortness of breath     "stomach is in lungs" when had hernia 2014 none now(07/28/14)  . Pneumonia     hx  . Depression   . Anxiety   . Anemia     hx  . Shingles 5/15    hx  . Breast cancer 3/16   Past Surgical History  Procedure Laterality Date  . Total knee arthroplasty Bilateral   . Esophageal manometry N/A 07/02/2012    Procedure: ESOPHAGEAL MANOMETRY (EM);  Surgeon: Lear Ng, MD;  Location: [facility];  Service: Endoscopy;  Laterality: N/A;  schooler to read  . Tonsillectomy  as child  . Hiatal hernia repair N/A 08/10/2012    Procedure: LAPAROSCOPIC REPAIR OF HIATAL HERNIA WITH MESH AND EGD WITH PEG TUBE PLACEMENT;  Surgeon: Ralene Ok, MD;  Location: [facility];  Service: General;  Laterality: N/A;  . Breast surgery      r breast biopsy  . Joint replacement Bilateral 2011,2012    knees  . Eye surgery  2013    tube placed Left ,tear duct  . Breast lumpectomy with sentinel lymph node biopsy Right 09/22/2014    Procedure: RIGHT BREAST LUMPECTOMY WITH NEEDLE LOCALIZATION AND RIGHT AXILLARY SENTINEL LYMPH NODE BIOPSY;  Surgeon: [name], MD;  Location: [place];  Service: General;  Laterality: Right;  . Portacath placement Left 09/22/2014    Procedure:  INSERTION PORT-A-CATH;  Surgeon: [name], MD;  Location: [place];  Service: General;  Laterality: Left;   Social History:  History   Social History Narrative   Lives with husband.  Does not use assist device.      Allergies  Allergen Reactions  . Shellfish Allergy Anaphylaxis    ANAPHYLAXIS TO OYSTERS  . Lactose Intolerance (Gi) Other (See Comments)  . Aleve [Naproxen] Nausea And Vomiting  . Aspirin Other (See Comments)    Burns stomach  . Ciprofloxacin Nausea And Vomiting  . Doxycycline Nausea And Vomiting  . Evista [Raloxifene] Other (See Comments)    Leg aches  . Fosamax [Alendronate Sodium] Other (See Comments)    Leg aches  . Pravachol [Pravastatin] Other (See Comments)    GI  upset   . Sulfa Antibiotics Nausea Only  . Welchol [Colesevelam Hcl] Other (See Comments)    Stomach cramps  . Zocor [Simvastatin] Rash    Family History  Problem Relation Age of Onset  . Heart disease Mother     31s, CHF  . Heart disease Father     d/o MI at 60  . Cancer Father     skin  . Diabetes Father   . Cancer Sister     breast  . Diabetes Sister   . Heart disease Sister     CABG in [place]     Prior to Admission medications   Medication Sig Start Date End Date Taking? Authorizing Provider  calcium carbonate (OS-CAL) 600 MG TABS Take 600 mg by mouth 2 (two) times daily with a meal.   Yes Historical Provider, MD  carboxymethylcellulose (REFRESH PLUS) 0.5 % SOLN Place 1 drop into both eyes every 3 (three) hours.    Yes Historical Provider, MD  Cholecalciferol (VITAMIN D) 2000 UNITS tablet Take 2,000 Units by mouth 2 (two) times daily.    Yes Historical Provider, MD  dexamethasone (DECADRON) 4 MG tablet Take 1 tablet (4 mg total) by mouth 2 (two) times daily. Start the day before Taxotere. Then again the day after chemo for 3 days. 09/30/14  Yes Nicholas Lose, MD  dexlansoprazole (DEXILANT) 60 MG capsule Take 60 mg by mouth daily.   Yes Historical Provider, MD  ferrous sulfate 325 (65 FE) MG tablet Take 1 tablet (325 mg total) by mouth 2 (two) times daily with a meal. 10/24/14  Yes Hosie Poisson, MD  fexofenadine (ALLEGRA) 180 MG tablet Take 180 mg by mouth daily.   Yes Historical Provider, MD  FLUoxetine (PROZAC) 20 MG capsule Take 20 mg by mouth daily. 06/24/14  Yes Historical Provider, MD  gabapentin (NEURONTIN) 300 MG capsule Take 300 mg by mouth at bedtime.  09/09/14  Yes Historical Provider, MD  lidocaine-prilocaine (EMLA) cream Apply to affected area once 09/30/14  Yes Nicholas Lose, MD  LORazepam (ATIVAN) 0.5 MG tablet Take 1 tablet (0.5 mg total) by mouth at bedtime. 11/04/14  Yes Nicholas Lose, MD  lovastatin (MEVACOR) 40 MG tablet Take 40 mg by mouth daily before breakfast.     Yes Historical Provider, MD  meclizine (ANTIVERT) 25 MG tablet Take 12.5-25 mg by mouth 3 (three) times daily as needed for dizziness.   Yes Historical Provider, MD  meloxicam (MOBIC) 15 MG tablet Take 15 mg by mouth daily.  09/09/14  Yes Historical Provider, MD  nystatin (MYCOSTATIN) 100000 UNIT/ML suspension Take 5 mLs (500,000 Units total) by mouth 4 (four) times daily. 11/11/14  Yes Nicholas Lose, MD  polyethylene glycol (MIRALAX /  GLYCOLAX) packet Take 17 g by mouth as needed for mild constipation.    Yes Historical Provider, MD  [place]; [place].   Yes Historical Provider, MD  saccharomyces boulardii (FLORASTOR) 250 MG capsule Take 1 capsule (250 mg total) by mouth 2 (two) times daily. 10/24/14  Yes Hosie Poisson, MD  sodium chloride (OCEAN) 0.65 % SOLN nasal spray Place 2 sprays into both nostrils as needed for congestion.   Yes Historical Provider, MD  fluconazole (DIFLUCAN) 200 MG tablet Take 1 tablet (200 mg total) by mouth daily. Patient not taking: Reported on 12/23/2014 10/24/14   Hosie Poisson, MD  ondansetron (ZOFRAN) 8 MG tablet Take 1 tablet (8 mg total) by mouth 2 (two) times daily. Start the day after chemo for 3 days. Then take as needed for nausea or vomiting. Patient not taking: Reported on 12/23/2014 09/30/14   Nicholas Lose, MD  oxyCODONE-acetaminophen (ROXICET) 5-325 MG per tablet Take 1-2 tablets by mouth every 4 (four) hours as needed. Patient not taking: Reported on 12/23/2014 10/15/14   Susanne Borders, NP  prochlorperazine (COMPAZINE) 10 MG tablet Take 1 tablet (10 mg total) by mouth every 6 (six) hours as needed (Nausea or vomiting). Patient not taking: Reported on 12/23/2014 09/30/14   Nicholas Lose, MD   Physical Exam: Filed Vitals:   12/23/14 1437 12/23/14 1440  BP: 130/55   Pulse: 99   Temp: 100.8 F (38.2 C)   TempSrc: Oral   Resp: 20   Height: 5' (1.524 m)   Weight:  74.027 kg (163 lb 3.2 oz)  SpO2: 96%     EOMI NCATno pall or no icterus no  JVD Chest clinically clear no added sound no rhonchi no rale S1-S2 no murmur rub or gallop mildly tachycardic  abdomen is soft nontender nondistended no rebound no guardin   neurologically intact EOMI NCAT vision by direct confrontation is normal lower extremities are soft and nontender without any swelling   Labs on Admission:  Basic Metabolic Panel:  Recent Labs Lab 12/23/14 1035  NA 140  K 3.5  CO2 29  GLUCOSE 107  BUN 6.9*  CREATININE 0.7  CALCIUM 8.6   Liver Function Tests:  Recent Labs Lab 12/23/14 1035  AST 12  ALT 13  ALKPHOS 73  BILITOT 0.71  PROT 6.0*  ALBUMIN 3.0*   No results for input(s): LIPASE, AMYLASE in the last 168 hours. No results for input(s): AMMONIA in the last 168 hours. CBC:  Recent Labs Lab 12/23/14 1035  WBC 1.7*  NEUTROABS 0.7*  HGB 9.1*  HCT 27.7*  MCV 84.1  PLT 121*   Cardiac Enzymes: No results for input(s): CKTOTAL, CKMB, CKMBINDEX, TROPONINI in the last 168 hours.  BNP (last 3 results) No results for input(s): BNP in the last 8760 hours.  ProBNP (last 3 results) No results for input(s): PROBNP in the last 8760 hours.  CBG: No results for input(s): GLUCAP in the last 168 hours.  Radiological Exams on Admission: No results found.  EKG: Independently reviewed. None performed  Assessment/Plan Principal Problem:   Neutropenia with fever Active Problems:   Sliding hiatal hernia s/p lap PEH repair   Atrial fibrillation   Hypertension   GERD (gastroesophageal reflux disease)   Depression   Hypotension due to drugs   Shingles   Breast cancer of upper-outer quadrant of right female breast   UTI (lower urinary tract infection)   Anemia due to chemotherapy  Neutropenic fever -Follow focused order set and will order Zosyn  monotherapy which should cover most organisms -I suspect her etiology might be either urinary as she's had multiple urinary tract infections in the past versus respiratory in terms of upper  respiratory infection as she's felt some sinus pressure -Will order in and out cath + urinary culture -Rapid strep test to throat -Will copy her oncologist on this note and may need Neulasta/Neupogen support if her counts on ANC dropped below 500 -Repeat CBC plus differential in a.m.   vaginal Candidiasis -Continue nystatin swish swallow while on antibiotics-risk factor Decadron  -continue fluconazole X one dose   breast cancer -Defer to oncology further treatment with Taxotere/Cytoxan as outpatient   chronic pa in - continue Percocet 1-2 every 4 when necessary   borderline microcytic anemia -Continue iron supplementation We will repeat complete blood count in a.m.   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

## 2014-12-23 NOTE — Progress Notes (Signed)
ANTIBIOTIC CONSULT NOTE - INITIAL  Pharmacy Consult for Vancomycin & Zosyn Indication: Sepsis/Bacteremia  Allergies  Allergen Reactions  . Shellfish Allergy Anaphylaxis    ANAPHYLAXIS TO OYSTERS  . Lactose Intolerance (Gi) Other (See Comments)  . Aleve [Naproxen] Nausea And Vomiting  . Aspirin Other (See Comments)    Burns stomach  . Ciprofloxacin Nausea And Vomiting  . Doxycycline Nausea And Vomiting  . Evista [Raloxifene] Other (See Comments)    Leg aches  . Fosamax [Alendronate Sodium] Other (See Comments)    Leg aches  . Pravachol [Pravastatin] Other (See Comments)    GI upset   . Sulfa Antibiotics Nausea Only  . Welchol [Colesevelam Hcl] Other (See Comments)    Stomach cramps  . Zocor [Simvastatin] Rash    Patient Measurements: Height: 5' (152.4 cm) Weight: 163 lb 3.2 oz (74.027 kg) IBW/kg (Calculated) : 45.5  Vital Signs: Temp: 100.8 F (38.2 C) (08/09 1437) Temp Source: Oral (08/09 1437) BP: 130/55 mmHg (08/09 1437) Pulse Rate: 99 (08/09 1437) Intake/Output from previous day:   Intake/Output from this shift:    Labs:  Recent Labs  12/23/14 1035 12/23/14 1035  WBC 1.7*  --   HGB 9.1*  --   PLT 121*  --   CREATININE  --  0.7   Estimated Creatinine Clearance: 58.8 mL/min (by C-G formula based on Cr of 0.7). No results for input(s): VANCOTROUGH, VANCOPEAK, VANCORANDOM, GENTTROUGH, GENTPEAK, GENTRANDOM, TOBRATROUGH, TOBRAPEAK, TOBRARND, AMIKACINPEAK, AMIKACINTROU, AMIKACIN in the last 72 hours.   Microbiology: No results found for this or any previous visit (from the past 720 hour(s)).  Medical History: Past Medical History  Diagnosis Date  . Arthritis   . Hypertension   . GERD (gastroesophageal reflux disease)   . Hypercholesteremia   . Heart murmur   . Anxiety and depression   . H/O hiatal hernia   . Headache(784.0)     sinus  . Dysrhythmia   . Shortness of breath     "stomach is in lungs" when had hernia 2014 none now(07/28/14)  .  Pneumonia     hx  . Depression   . Anxiety   . Anemia     hx  . Shingles 5/15    hx  . Breast cancer 3/16    Medications:  Scheduled:  . piperacillin-tazobactam (ZOSYN)  IV  3.375 g Intravenous STAT  . vancomycin  750 mg Intravenous STAT   Infusions:  . sodium chloride     Assessment:  69 yr female with h/o breast cancer, currently undergoing chemotherapy.  Presents with fever (Tmax = 102) and WBC = 1.7.  Last chemotherapy was 8/2.  Pharmacy consulted to dose Vancomycin and Zosyn for sepsis/bacteremia  Blood and urine cultures ordered  CrCl ~ 58 ml/min  Goal of Therapy:  Vancomycin trough level 15-20 mcg/ml  Plan:  Measure antibiotic drug levels at steady state Follow up culture results   Vancomycin 750mg  IV q12h  Zosyn 3.375gm IV q8h (each dose infused over 4 hrs)  Jachob Mcclean, Toribio Harbour, PharmD 12/23/2014,3:37 PM

## 2014-12-23 NOTE — Patient Instructions (Signed)
Neutropenia Neutropenia is a condition that occurs when the level of a certain type of white blood cell (neutrophil) in your body becomes lower than normal. Neutrophils are made in the bone marrow and fight infections. These cells protect against bacteria and viruses. The fewer neutrophils you have, and the longer your body remains without them, the greater your risk of getting a severe infection becomes. CAUSES  The cause of neutropenia may be hard to determine. However, it is usually due to 3 main problems:   Decreased production of neutrophils. This may be due to:  Certain medicines such as chemotherapy.  Genetic problems.  Cancer.  Radiation treatments.  Vitamin deficiency.  Some pesticides.  Increased destruction of neutrophils. This may be due to:  Overwhelming infections.  Hemolytic anemia. This is when the body destroys its own blood cells.  Chemotherapy.  Neutrophils moving to areas of the body where they cannot fight infections. This may be due to:  Dialysis procedures.  Conditions where the spleen becomes enlarged. Neutrophils are held in the spleen and are not available to the rest of the body.  Overwhelming infections. The neutrophils are held in the area of the infection and are not available to the rest of the body. SYMPTOMS  There are no specific symptoms of neutropenia. The lack of neutrophils can result in an infection, and an infection can cause various problems. DIAGNOSIS  Diagnosis is made by a blood test. A complete blood count is performed. The normal level of neutrophils in human blood differs with age and race. Infants have lower counts than older children and adults. African Americans have lower counts than Caucasians or Asians. The average adult level is 1500 cells/mm3 of blood. Neutrophil counts are interpreted as follows:  Greater than 1000 cells/mm3 gives normal protection against infection.  500 to 1000 cells/mm3 gives an increased risk for  infection.  200 to 500 cells/mm3 is a greater risk for severe infection.  Lower than 200 cells/mm3 is a marked risk of infection. This may require hospitalization and treatment with antibiotic medicines. TREATMENT  Treatment depends on the underlying cause, severity, and presence of infections or symptoms. It also depends on your health. Your caregiver will discuss the treatment plan with you. Mild cases are often easily treated and have a good outcome. Preventative measures may also be started to limit your risk of infections. Treatment can include:  Taking antibiotics.  Stopping medicines that are known to cause neutropenia.  Correcting nutritional deficiencies by eating green vegetables to supply folic acid and taking vitamin B supplements.  Stopping exposure to pesticides if your neutropenia is related to pesticide exposure.  Taking a blood growth factor called sargramostim, pegfilgrastim, or filgrastim if you are undergoing chemotherapy for cancer. This stimulates white blood cell production.  Removal of the spleen if you have Felty's syndrome and have repeated infections. HOME CARE INSTRUCTIONS   Follow your caregiver's instructions about when you need to have blood work done.  Wash your hands often. Make sure others who come in contact with you also wash their hands.  Wash raw fruits and vegetables before eating them. They can carry bacteria and fungi.  Avoid people with colds or spreadable (contagious) diseases (chickenpox, herpes zoster, influenza).  Avoid large crowds.  Avoid construction areas. The dust can release fungus into the air.  Be cautious around children in daycare or school environments.  Take care of your respiratory system by coughing and deep breathing.  Bathe daily.  Protect your skin from cuts and   burns.  Do not work in the garden or with flowers and plants.  Care for the mouth before and after meals by brushing with a soft toothbrush. If you have  mucositis, do not use mouthwash. Mouthwash contains alcohol and can dry out the mouth even more.  Clean the area between the genitals and the anus (perineal area) after urination and bowel movements. Women need to wipe from front to back.  Use a water soluble lubricant during sexual intercourse and practice good hygiene after. Do not have intercourse if you are severely neutropenic. Check with your caregiver for guidelines.  Exercise daily as tolerated.  Avoid people who were vaccinated with a live vaccine in the past 30 days. You should not receive live vaccines (polio, typhoid).  Do not provide direct care for pets. Avoid animal droppings. Do not clean litter boxes and bird cages.  Do not share food utensils.  Do not use tampons, enemas, or rectal suppositories unless directed by your caregiver.  Use an electric razor to remove hair.  Wash your hands after handling magazines, letters, and newspapers. SEEK IMMEDIATE MEDICAL CARE IF:   You have a fever.  You have chills or start to shake.  You feel nauseous or vomit.  You develop mouth sores.  You develop aches and pains.  You have redness and swelling around open wounds.  Your skin is warm to the touch.  You have pus coming from your wounds.  You develop swollen lymph nodes.  You feel weak or fatigued.  You develop red streaks on the skin. MAKE SURE YOU:  Understand these instructions.  Will watch your condition.  Will get help right away if you are not doing well or get worse. Document Released: 10/22/2001 Document Revised: 07/25/2011 Document Reviewed: 11/19/2010 [facility] Patient Information 2015 [place], [place]. This information is not intended to replace advice given to you by your health care provider. Make sure you discuss any questions you have with your health care provider. Dehydration, Adult Dehydration is when you lose more fluids from the body than you take in. Vital organs like the kidneys, brain, and  heart cannot function without a proper amount of fluids and salt. Any loss of fluids from the body can cause dehydration.  CAUSES   Vomiting.  Diarrhea.  Excessive sweating.  Excessive urine output.  Fever. SYMPTOMS  Mild dehydration  Thirst.  Dry lips.  Slightly dry mouth. Moderate dehydration  Very dry mouth.  Sunken eyes.  Skin does not bounce back quickly when lightly pinched and released.  Dark urine and decreased urine production.  Decreased tear production.  Headache. Severe dehydration  Very dry mouth.  Extreme thirst.  Rapid, weak pulse (more than 100 beats per minute at rest).  Cold hands and feet.  Not able to sweat in spite of heat and temperature.  Rapid breathing.  Blue lips.  Confusion and lethargy.  Difficulty being awakened.  Minimal urine production.  No tears. DIAGNOSIS  Your caregiver will diagnose dehydration based on your symptoms and your exam. Blood and urine tests will help confirm the diagnosis. The diagnostic evaluation should also identify the cause of dehydration. TREATMENT  Treatment of mild or moderate dehydration can often be done at home by increasing the amount of fluids that you drink. It is best to drink small amounts of fluid more often. Drinking too much at one time can make vomiting worse. Refer to the home care instructions below. Severe dehydration needs to be treated at the hospital where you  will probably be given intravenous (IV) fluids that contain water and electrolytes. HOME CARE INSTRUCTIONS   Ask your caregiver about specific rehydration instructions.  Drink enough fluids to keep your urine clear or pale yellow.  Drink small amounts frequently if you have nausea and vomiting.  Eat as you normally do.  Avoid:  Foods or drinks high in sugar.  Carbonated drinks.  Juice.  Extremely hot or cold fluids.  Drinks with caffeine.  Fatty, greasy  foods.  Alcohol.  Tobacco.  Overeating.  Gelatin desserts.  Wash your hands well to avoid spreading bacteria and viruses.  Only take over-the-counter or prescription medicines for pain, discomfort, or fever as directed by your caregiver.  Ask your caregiver if you should continue all prescribed and over-the-counter medicines.  Keep all follow-up appointments with your caregiver. SEEK MEDICAL CARE IF:  You have abdominal pain and it increases or stays in one area (localizes).  You have a rash, stiff neck, or severe headache.  You are irritable, sleepy, or difficult to awaken.  You are weak, dizzy, or extremely thirsty. SEEK IMMEDIATE MEDICAL CARE IF:   You are unable to keep fluids down or you get worse despite treatment.  You have frequent episodes of vomiting or diarrhea.  You have blood or green matter (bile) in your vomit.  You have blood in your stool or your stool looks black and tarry.  You have not urinated in 6 to 8 hours, or you have only urinated a small amount of very dark urine.  You have a fever.  You faint. MAKE SURE YOU:   Understand these instructions.  Will watch your condition.  Will get help right away if you are not doing well or get worse. Document Released: 05/02/2005 Document Revised: 07/25/2011 Document Reviewed: 12/20/2010 [facility] Patient Information 2015 [place], [place]. This information is not intended to replace advice given to you by your health care provider. Make sure you discuss any questions you have with your health care provider.

## 2014-12-23 NOTE — Progress Notes (Signed)
Blood cultures drawn from left hand-peripherally & from port.  Pt to be admitted to [facility] hospital.  D/C from outpt unit to inpatient unit with NS at [facility]

## 2014-12-23 NOTE — Telephone Encounter (Signed)
Patient called in stating that she has developed chills/fever since last night. This morning patient had a temperature of 100.8. Patient denies SOB, but has little chest pain. Patient finished her last dose of chemotherapy 12/16/14. Patient is in for fluids at 1015 today. Labs/appoinment with Selena Lesser NP infusion. Patient verbalized understanding. Urgent POF sent to scheduler.

## 2014-12-23 NOTE — Telephone Encounter (Signed)
Per pof patient was given times for add on lab then cindy to see in tx  anne

## 2014-12-24 ENCOUNTER — Encounter: Payer: Self-pay | Admitting: Nurse Practitioner

## 2014-12-24 DIAGNOSIS — N390 Urinary tract infection, site not specified: Secondary | ICD-10-CM

## 2014-12-24 DIAGNOSIS — R5081 Fever presenting with conditions classified elsewhere: Principal | ICD-10-CM

## 2014-12-24 DIAGNOSIS — D6481 Anemia due to antineoplastic chemotherapy: Secondary | ICD-10-CM

## 2014-12-24 DIAGNOSIS — D709 Neutropenia, unspecified: Principal | ICD-10-CM

## 2014-12-24 DIAGNOSIS — E860 Dehydration: Secondary | ICD-10-CM

## 2014-12-24 DIAGNOSIS — C50411 Malignant neoplasm of upper-outer quadrant of right female breast: Secondary | ICD-10-CM

## 2014-12-24 DIAGNOSIS — B961 Klebsiella pneumoniae [K. pneumoniae] as the cause of diseases classified elsewhere: Secondary | ICD-10-CM

## 2014-12-24 DIAGNOSIS — T451X5A Adverse effect of antineoplastic and immunosuppressive drugs, initial encounter: Secondary | ICD-10-CM

## 2014-12-24 LAB — CBC WITH DIFFERENTIAL/PLATELET
BASOS ABS: 0 10*3/uL (ref 0.0–0.1)
BASOS PCT: 1 % (ref 0–1)
EOS ABS: 0 10*3/uL (ref 0.0–0.7)
Eosinophils Relative: 1 % (ref 0–5)
HCT: 24.2 % — ABNORMAL LOW (ref 36.0–46.0)
HEMOGLOBIN: 7.6 g/dL — AB (ref 12.0–15.0)
LYMPHS PCT: 30 % (ref 12–46)
Lymphs Abs: 0.9 10*3/uL (ref 0.7–4.0)
MCH: 27.1 pg (ref 26.0–34.0)
MCHC: 31.4 g/dL (ref 30.0–36.0)
MCV: 86.4 fL (ref 78.0–100.0)
MONO ABS: 0.7 10*3/uL (ref 0.1–1.0)
Monocytes Relative: 22 % — ABNORMAL HIGH (ref 3–12)
NEUTROS ABS: 1.4 10*3/uL — AB (ref 1.7–7.7)
Neutrophils Relative %: 46 % (ref 43–77)
Platelets: 111 10*3/uL — ABNORMAL LOW (ref 150–400)
RBC: 2.8 MIL/uL — AB (ref 3.87–5.11)
RDW: 19.2 % — AB (ref 11.5–15.5)
WBC: 3 10*3/uL — ABNORMAL LOW (ref 4.0–10.5)

## 2014-12-24 LAB — COMPREHENSIVE METABOLIC PANEL
ALT: 13 U/L — AB (ref 14–54)
AST: 13 U/L — AB (ref 15–41)
Albumin: 2.6 g/dL — ABNORMAL LOW (ref 3.5–5.0)
Alkaline Phosphatase: 62 U/L (ref 38–126)
Anion gap: 6 (ref 5–15)
BILIRUBIN TOTAL: 0.5 mg/dL (ref 0.3–1.2)
BUN: 6 mg/dL (ref 6–20)
CO2: 28 mmol/L (ref 22–32)
Calcium: 7.8 mg/dL — ABNORMAL LOW (ref 8.9–10.3)
Chloride: 107 mmol/L (ref 101–111)
Creatinine, Ser: 0.67 mg/dL (ref 0.44–1.00)
Glucose, Bld: 86 mg/dL (ref 65–99)
Potassium: 3.2 mmol/L — ABNORMAL LOW (ref 3.5–5.1)
Sodium: 141 mmol/L (ref 135–145)
TOTAL PROTEIN: 5.5 g/dL — AB (ref 6.5–8.1)

## 2014-12-24 LAB — MRSA PCR SCREENING: MRSA by PCR: POSITIVE — AB

## 2014-12-24 LAB — PROTIME-INR
INR: 1.34 (ref 0.00–1.49)
Prothrombin Time: 16.7 seconds — ABNORMAL HIGH (ref 11.6–15.2)

## 2014-12-24 LAB — RAPID STREP SCREEN (MED CTR MEBANE ONLY): Streptococcus, Group A Screen (Direct): NEGATIVE

## 2014-12-24 LAB — ABO/RH: ABO/RH(D): A NEG

## 2014-12-24 LAB — PREPARE RBC (CROSSMATCH)

## 2014-12-24 MED ORDER — MUPIROCIN 2 % EX OINT
1.0000 "application " | TOPICAL_OINTMENT | Freq: Two times a day (BID) | CUTANEOUS | Status: DC
  Administered 2014-12-24 – 2014-12-25 (×3): 1 via NASAL
  Filled 2014-12-24: qty 22

## 2014-12-24 MED ORDER — SODIUM CHLORIDE 0.9 % IV SOLN
Freq: Once | INTRAVENOUS | Status: AC
  Administered 2014-12-24: 10 mL/h via INTRAVENOUS

## 2014-12-24 MED ORDER — POTASSIUM CHLORIDE CRYS ER 20 MEQ PO TBCR
40.0000 meq | EXTENDED_RELEASE_TABLET | Freq: Once | ORAL | Status: AC
  Administered 2014-12-24: 40 meq via ORAL
  Filled 2014-12-24: qty 2

## 2014-12-24 MED ORDER — CHLORHEXIDINE GLUCONATE CLOTH 2 % EX PADS
6.0000 | MEDICATED_PAD | Freq: Every day | CUTANEOUS | Status: DC
  Administered 2014-12-24 – 2014-12-25 (×2): 6 via TOPICAL

## 2014-12-24 NOTE — Assessment & Plan Note (Signed)
Patient received her last cycle of Taxotere chemotherapy on 12/16/2014.  She presented to the [facility] today to receive IV fluid rehydration for chronic dehydration issues following each chemotherapy cycle.  Patient reports developing a fever to maximum of 100.8 over this past weekend.  She denies any URI or UTI symptoms.  She also denies any cough. Temperature today was 102.0.   Blood counts obtained today do reveal a WBC of 1.7, ANC 0.7, hemoglobin 9.1, platelet count 121.  Of note-patient was diagnosed and admitted to the hospital for treatment of Klebsiella urinary tract infection with fever and neutropenia in mid June 2016.  Since that time.-Patient has had Keflex and a biotics, Augmentin, and just finished Macrobid and a biotics on 12/16/2014.  Patient will be admitted to the hospital as a direct admit for further evaluation and management of neutropenic fever.  Have obtained a urine for urinalysis.  Have also obtained blood cultures 2-with one blood draw from her Port-A-Cath and 1.  Blood culture peripherally.  Pending results on both urine and blood cultures.  Brief history and report were called to Dr. Coralyn Pear hospitalist.  Dr. Coralyn Pear is aware that both urine and blood cultures 2 have been obtained; but no results as of yet.  Also advised Dr. Coralyn Pear was unable to initiate IV anti-biotics prior to pt being transferred to direct admit room.  Patient was transported to room 1343 via wheelchair.  Per the cancer Center nurse.  Patient is scheduled for an initial radiation oncology consultation on 01/07/2015.  She is scheduled to return to the [facility] for labs and a follow-up visit on 03/17/2015.

## 2014-12-24 NOTE — Assessment & Plan Note (Signed)
Patient received her last cycle of Taxotere chemotherapy on 12/16/2014.  She presented to the [facility] today to receive IV fluid rehydration.  4.  Chronic dehydration issues following each chemotherapy cycle.  Patient reports developing a fever to maximum of 100.8 over this past weekend.  She denies any URI or UTI symptoms.  She also denies any cough.  Blood counts obtained today do reveal a WBC of 1.7, ANC 0.7, hemoglobin 9.1, platelet count 121.  Of note-patient was diagnosed and admitted to the hospital for treatment of Klebsiella urinary tract infection with fever and neutropenia in mid June 2016.  Since that time.-Patient has had Keflex and a biotics, Augmentin, and just finished Macrobid and a biotics on 12/16/2014.  Patient will be admitted to the hospital as a direct admit for further evaluation and management.  Patient is scheduled for an initial radiation oncology consultation on 01/07/2015.  She is scheduled to return to the [facility] for labs and a follow-up visit on 03/17/2015.

## 2014-12-24 NOTE — Progress Notes (Signed)
SYMPTOM MANAGEMENT CLINIC   HPI: Angela Bond 69 y.o. female diagnosed with breast cancer.  Currently undergoing Taxotere chemotherapy regimen.   Patient received her last cycle of Taxotere chemotherapy on 12/16/2014.  She presented to the [facility] today to receive IV fluid rehydration for chronic dehydration issues following each chemotherapy cycle.  Patient reports developing a fever to maximum of 100.8 over this past weekend.  She denies any URI or UTI symptoms.  She also denies any cough. Temperature today was 102.0.   Blood counts obtained today do reveal a WBC of 1.7, ANC 0.7, hemoglobin 9.1, platelet count 121.  Of note-patient was diagnosed and admitted to the hospital for treatment of Klebsiella urinary tract infection with fever and neutropenia in mid June 2016.  Since that time.-Patient has had Keflex and a biotics, Augmentin, and just finished Macrobid and a biotics on 12/16/2014.  HPI  ROS  Past Medical History  Diagnosis Date  . Arthritis   . Hypertension   . GERD (gastroesophageal reflux disease)   . Hypercholesteremia   . Heart murmur   . Anxiety and depression   . H/O hiatal hernia   . Headache(784.0)     sinus  . Dysrhythmia   . Shortness of breath     "stomach is in lungs" when had hernia 2014 none now(07/28/14)  . Pneumonia     hx  . Depression   . Anxiety   . Anemia     hx  . Shingles 5/15    hx  . Breast cancer 3/16    Past Surgical History  Procedure Laterality Date  . Total knee arthroplasty Bilateral   . Esophageal manometry N/A 07/02/2012    Procedure: ESOPHAGEAL MANOMETRY (EM);  Surgeon: Lear Ng, MD;  Location: [facility];  Service: Endoscopy;  Laterality: N/A;  schooler to read  . Tonsillectomy  as child  . Hiatal hernia repair N/A 08/10/2012    Procedure: LAPAROSCOPIC REPAIR OF HIATAL HERNIA WITH MESH AND EGD WITH PEG TUBE PLACEMENT;  Surgeon: Ralene Ok, MD;  Location: [facility];  Service: General;  Laterality:  N/A;  . Breast surgery      r breast biopsy  . Joint replacement Bilateral 2011,2012    knees  . Eye surgery  2013    tube placed Left ,tear duct  . Breast lumpectomy with sentinel lymph node biopsy Right 09/22/2014    Procedure: RIGHT BREAST LUMPECTOMY WITH NEEDLE LOCALIZATION AND RIGHT AXILLARY SENTINEL LYMPH NODE BIOPSY;  Surgeon: [name], MD;  Location: [facility];  Service: General;  Laterality: Right;  . Portacath placement Left 09/22/2014    Procedure: INSERTION PORT-A-CATH;  Surgeon: [name], MD;  Location: [facility];  Service: General;  Laterality: Left;    has Other and unspecified angina pectoris; Sliding hiatal hernia s/p lap PEH repair; Atrial fibrillation; Hypertension; Abdominal pain, unspecified site; GERD (gastroesophageal reflux disease); Depression; Hypercholesteremia; Arthritis; Chest pressure; Hypotension due to drugs; Shingles; Lumbar spinal stenosis; Breast cancer of upper-outer quadrant of right female breast; Fall; Neutropenia; UTI (lower urinary tract infection); Hypokalemia; Anemia due to chemotherapy; Neutropenia with fever; Neutropenic fever; and Dehydration on her problem list.    is allergic to shellfish allergy; lactose intolerance (gi); aleve; aspirin; ciprofloxacin; doxycycline; evista; fosamax; pravachol; sulfa antibiotics; welchol; and zocor.    Medication List       This list is accurate as of: 12/23/14  2:36 PM.  Always use your most recent med list.  amoxicillin-clavulanate 875-125 MG per tablet  Commonly known as:  AUGMENTIN  Take 1 tablet by mouth every 12 (twelve) hours. For 10 Days. ABT Start Date 11/24/14 & 12/03/14.     calcium carbonate 600 MG Tabs tablet  Commonly known as:  OS-CAL  Take 600 mg by mouth 2 (two) times daily with a meal.     carboxymethylcellulose 0.5 % Soln  Commonly known as:  REFRESH PLUS  Place 1 drop into both eyes every 3 (three) hours.     dexamethasone 4 MG tablet  Commonly known as:  DECADRON  Take  1 tablet (4 mg total) by mouth 2 (two) times daily. Start the day before Taxotere. Then again the day after chemo for 3 days.     dexlansoprazole 60 MG capsule  Commonly known as:  DEXILANT  Take 60 mg by mouth daily.     ferrous sulfate 325 (65 FE) MG tablet  Take 1 tablet (325 mg total) by mouth 2 (two) times daily with a meal.     fexofenadine 180 MG tablet  Commonly known as:  ALLEGRA  Take 180 mg by mouth daily.     fluconazole 200 MG tablet  Commonly known as:  DIFLUCAN  Take 1 tablet (200 mg total) by mouth daily.     fluconazole 150 MG tablet  Commonly known as:  DIFLUCAN  TAKE 1 TABLET BY MOUTH TODAY AND THEN REPEAT IN 3-5 DAYS     FLUoxetine 20 MG capsule  Commonly known as:  PROZAC  Take 20 mg by mouth daily.     gabapentin 300 MG capsule  Commonly known as:  NEURONTIN  Take 300 mg by mouth at bedtime.     lidocaine-prilocaine cream  Commonly known as:  EMLA  Apply to affected area once     LORazepam 0.5 MG tablet  Commonly known as:  ATIVAN  Take 1 tablet (0.5 mg total) by mouth at bedtime.     lovastatin 40 MG tablet  Commonly known as:  MEVACOR  Take 40 mg by mouth daily before breakfast.     meclizine 25 MG tablet  Commonly known as:  ANTIVERT  Take 12.5-25 mg by mouth 3 (three) times daily as needed for dizziness.     meloxicam 15 MG tablet  Commonly known as:  MOBIC  Take 15 mg by mouth daily.     nitrofurantoin (macrocrystal-monohydrate) 100 MG capsule  Commonly known as:  MACROBID  TAKE 1 CAPSULE WITH FOOD EVERY 12 HRS ORALLY 10 DAYS     nystatin 100000 UNIT/ML suspension  Commonly known as:  MYCOSTATIN  Take 5 mLs (500,000 Units total) by mouth 4 (four) times daily.     ondansetron 8 MG tablet  Commonly known as:  ZOFRAN  Take 1 tablet (8 mg total) by mouth 2 (two) times daily. Start the day after chemo for 3 days. Then take as needed for nausea or vomiting.     oxyCODONE-acetaminophen 5-325 MG per tablet  Commonly known as:  ROXICET    Take 1-2 tablets by mouth every 4 (four) hours as needed.     polyethylene glycol packet  Commonly known as:  MIRALAX / GLYCOLAX  Take 17 g by mouth as needed for mild constipation.     PRESCRIPTION MEDICATION  Chemo; [facility].     prochlorperazine 10 MG tablet  Commonly known as:  COMPAZINE  Take 1 tablet (10 mg total) by mouth every 6 (six) hours as needed (Nausea or vomiting).  saccharomyces boulardii 250 MG capsule  Commonly known as:  FLORASTOR  Take 1 capsule (250 mg total) by mouth 2 (two) times daily.     sodium chloride 0.65 % Soln nasal spray  Commonly known as:  OCEAN  Place 2 sprays into both nostrils as needed for congestion.     Vitamin D 2000 UNITS tablet  Take 2,000 Units by mouth 2 (two) times daily.         PHYSICAL EXAMINATION  Oncology Vitals 12/24/2014 12/23/2014 12/23/2014 12/23/2014 12/23/2014 12/23/2014 12/16/2014  Height - - - 152 cm - - 152 cm  Weight - - 74.027 kg - - - 75.07 kg  Weight (lbs) - - 163 lbs 3 oz - - - 165 lbs 8 oz  BMI (kg/m2) - - 31.87 kg/m2 - - - 32.32 kg/m2  Temp 98.2 98.6 - 100.8 100.9 102 98.8  Pulse 77 71 - 99 - 110 62  Resp 20 20 - 20 - - 18  SpO2 97 97 - 96 - - 100  BSA (m2) - - 1.77 m2 - - - 1.78 m2   BP Readings from Last 3 Encounters:  12/24/14 127/62  12/23/14 157/82  12/16/14 137/84    Physical Exam  Constitutional: She is oriented to person, place, and time. She appears dehydrated. She appears unhealthy.  HENT:  Head: Normocephalic and atraumatic.  Mouth/Throat: Oropharynx is clear and moist.  Eyes: Conjunctivae and EOM are normal. Pupils are equal, round, and reactive to light. Right eye exhibits no discharge. Left eye exhibits no discharge. No scleral icterus.  Neck: Normal range of motion. Neck supple. No JVD present. No tracheal deviation present. No thyromegaly present.  Cardiovascular: Normal rate, regular rhythm, normal heart sounds and intact distal pulses.   Pulmonary/Chest: Effort normal and breath sounds  normal. No respiratory distress. She has no wheezes. She has no rales. She exhibits no tenderness.  Abdominal: Soft. Bowel sounds are normal. She exhibits no distension and no mass. There is no tenderness. There is no rebound and no guarding.  Musculoskeletal: Normal range of motion. She exhibits no edema or tenderness.  Lymphadenopathy:    She has no cervical adenopathy.  Neurological: She is alert and oriented to person, place, and time.  Skin: Skin is warm and dry. No rash noted. No erythema. There is pallor.  Psychiatric: Affect normal.  Nursing note and vitals reviewed.   LABORATORY DATA:. Admission on 12/23/2014  Component Date Value Ref Range Status  . WBC 12/23/2014 2.5* 4.0 - 10.5 K/uL Final  . RBC 12/23/2014 2.97* 3.87 - 5.11 MIL/uL Final  . Hemoglobin 12/23/2014 8.1* 12.0 - 15.0 g/dL Final  . HCT 12/23/2014 25.6* 36.0 - 46.0 % Final  . MCV 12/23/2014 86.2  78.0 - 100.0 fL Final  . MCH 12/23/2014 27.3  26.0 - 34.0 pg Final  . MCHC 12/23/2014 31.6  30.0 - 36.0 g/dL Final  . RDW 12/23/2014 19.0* 11.5 - 15.5 % Final  . Platelets 12/23/2014 125* 150 - 400 K/uL Final  . Creatinine, Ser 12/23/2014 0.70  0.44 - 1.00 mg/dL Final  . GFR calc non Af Amer 12/23/2014 >60  >60 mL/min Final  . GFR calc Af Amer 12/23/2014 >60  >60 mL/min Final   Comment: (NOTE) The eGFR has been calculated using the CKD EPI equation. This calculation has not been validated in all clinical situations. eGFR's persistently <60 mL/min signify possible Chronic Kidney Disease.   . Strep Pneumo Urinary Antigen 12/23/2014 NEGATIVE  NEGATIVE Final  Comment: PERFORMED AT [facility]        Infection due to S. pneumoniae cannot be absolutely ruled out since the antigen present may be below the detection limit of the test.   . Streptococcus, Group A Screen (Dir* 12/24/2014 NEGATIVE  NEGATIVE Final  . Sodium 12/24/2014 141  135 - 145 mmol/L Final  . Potassium 12/24/2014 3.2* 3.5 - 5.1 mmol/L Final    . Chloride 12/24/2014 107  101 - 111 mmol/L Final  . CO2 12/24/2014 28  22 - 32 mmol/L Final  . Glucose, Bld 12/24/2014 86  65 - 99 mg/dL Final  . BUN 12/24/2014 6  6 - 20 mg/dL Final  . Creatinine, Ser 12/24/2014 0.67  0.44 - 1.00 mg/dL Final  . Calcium 12/24/2014 7.8* 8.9 - 10.3 mg/dL Final  . Total Protein 12/24/2014 5.5* 6.5 - 8.1 g/dL Final  . Albumin 12/24/2014 2.6* 3.5 - 5.0 g/dL Final  . AST 12/24/2014 13* 15 - 41 U/L Final  . ALT 12/24/2014 13* 14 - 54 U/L Final  . Alkaline Phosphatase 12/24/2014 62  38 - 126 U/L Final  . Total Bilirubin 12/24/2014 0.5  0.3 - 1.2 mg/dL Final  . GFR calc non Af Amer 12/24/2014 >60  >60 mL/min Final  . GFR calc Af Amer 12/24/2014 >60  >60 mL/min Final   Comment: (NOTE) The eGFR has been calculated using the CKD EPI equation. This calculation has not been validated in all clinical situations. eGFR's persistently <60 mL/min signify possible Chronic Kidney Disease.   . Anion gap 12/24/2014 6  5 - 15 Final  . WBC 12/24/2014 3.0* 4.0 - 10.5 K/uL Final   WHITE COUNT CONFIRMED ON SMEAR  . RBC 12/24/2014 2.80* 3.87 - 5.11 MIL/uL Final  . Hemoglobin 12/24/2014 7.6* 12.0 - 15.0 g/dL Final  . HCT 12/24/2014 24.2* 36.0 - 46.0 % Final  . MCV 12/24/2014 86.4  78.0 - 100.0 fL Final  . MCH 12/24/2014 27.1  26.0 - 34.0 pg Final  . MCHC 12/24/2014 31.4  30.0 - 36.0 g/dL Final  . RDW 12/24/2014 19.2* 11.5 - 15.5 % Final  . Platelets 12/24/2014 111* 150 - 400 K/uL Final   Comment: REPEATED TO VERIFY SPECIMEN CHECKED FOR CLOTS PLATELET COUNT CONFIRMED BY SMEAR   . Neutrophils Relative % 12/24/2014 46  43 - 77 % Final  . Lymphocytes Relative 12/24/2014 30  12 - 46 % Final  . Monocytes Relative 12/24/2014 22* 3 - 12 % Final  . Eosinophils Relative 12/24/2014 1  0 - 5 % Final  . Basophils Relative 12/24/2014 1  0 - 1 % Final  . Neutro Abs 12/24/2014 1.4* 1.7 - 7.7 K/uL Final  . Lymphs Abs 12/24/2014 0.9  0.7 - 4.0 K/uL Final  . Monocytes Absolute  12/24/2014 0.7  0.1 - 1.0 K/uL Final  . Eosinophils Absolute 12/24/2014 0.0  0.0 - 0.7 K/uL Final  . Basophils Absolute 12/24/2014 0.0  0.0 - 0.1 K/uL Final  . RBC Morphology 12/24/2014 POLYCHROMASIA PRESENT   Final  . WBC Morphology 12/24/2014 MILD LEFT SHIFT (1-5% METAS, OCC MYELO, OCC BANDS)   Final   DOHLE BODIES  . Smear Review 12/24/2014 LARGE PLATELETS PRESENT   Final  . Prothrombin Time 12/24/2014 16.7* 11.6 - 15.2 seconds Final  . INR 12/24/2014 1.34  0.00 - 1.49 Final  Appointment on 12/23/2014  Component Date Value Ref Range Status  . WBC 12/23/2014 1.7* 3.9 - 10.3 10e3/uL Final  . NEUT# 12/23/2014 0.7* 1.5 -  6.5 10e3/uL Final  . HGB 12/23/2014 9.1* 11.6 - 15.9 g/dL Final  . HCT 12/23/2014 27.7* 34.8 - 46.6 % Final  . Platelets 12/23/2014 121* 145 - 400 10e3/uL Final  . MCV 12/23/2014 84.1  79.5 - 101.0 fL Final  . MCH 12/23/2014 27.5  25.1 - 34.0 pg Final  . MCHC 12/23/2014 32.7  31.5 - 36.0 g/dL Final  . RBC 12/23/2014 3.29* 3.70 - 5.45 10e6/uL Final  . RDW 12/23/2014 20.9* 11.2 - 14.5 % Final  . lymph# 12/23/2014 0.3* 0.9 - 3.3 10e3/uL Final  . MONO# 12/23/2014 0.5  0.1 - 0.9 10e3/uL Final  . Eosinophils Absolute 12/23/2014 0.1  0.0 - 0.5 10e3/uL Final  . Basophils Absolute 12/23/2014 0.0  0.0 - 0.1 10e3/uL Final  . NEUT% 12/23/2014 44.4  38.4 - 76.8 % Final  . LYMPH% 12/23/2014 19.2  14.0 - 49.7 % Final  . MONO% 12/23/2014 32.4* 0.0 - 14.0 % Final  . EOS% 12/23/2014 3.0  0.0 - 7.0 % Final  . BASO% 12/23/2014 1.0  0.0 - 2.0 % Final  . Sodium 12/23/2014 140  136 - 145 mEq/L Final  . Potassium 12/23/2014 3.5  3.5 - 5.1 mEq/L Final  . Chloride 12/23/2014 102  98 - 109 mEq/L Final  . CO2 12/23/2014 29  22 - 29 mEq/L Final  . Glucose 12/23/2014 107  70 - 140 mg/dl Final  . BUN 12/23/2014 6.9* 7.0 - 26.0 mg/dL Final  . Creatinine 12/23/2014 0.7  0.6 - 1.1 mg/dL Final  . Total Bilirubin 12/23/2014 0.71  0.20 - 1.20 mg/dL Final  . Alkaline Phosphatase 12/23/2014 73  40 -  150 U/L Final  . AST 12/23/2014 12  5 - 34 U/L Final  . ALT 12/23/2014 13  0 - 55 U/L Final  . Total Protein 12/23/2014 6.0* 6.4 - 8.3 g/dL Final  . Albumin 12/23/2014 3.0* 3.5 - 5.0 g/dL Final  . Calcium 12/23/2014 8.6  8.4 - 10.4 mg/dL Final  . Anion Gap 12/23/2014 8  3 - 11 mEq/L Final  . EGFR 12/23/2014 88* >90 ml/min/1.73 m2 Final   eGFR is calculated using the CKD-EPI Creatinine Equation (2009)  . Glucose 12/23/2014 Negative  Negative mg/dL Final  . Bilirubin (Urine) 12/23/2014 Negative  Negative Final  . Ketones 12/23/2014 Negative  Negative mg/dL Final  . Specific Gravity, Urine 12/23/2014 1.005  1.003 - 1.035 Final  . Blood 12/23/2014 Small  Negative Final  . pH 12/23/2014 8.0  4.6 - 8.0 Final  . Protein 12/23/2014 < 30  Negative- <30 mg/dL Final  . Urobilinogen, UR 12/23/2014 0.2  0.2 - 1 mg/dL Final  . Nitrite 12/23/2014 Negative  Negative Final  . Leukocyte Esterase 12/23/2014 Moderate  Negative Final  . RBC / HPF 12/23/2014 3-6  0 - 2 Final  . WBC, UA 12/23/2014 7-10  0 - 2 Final  . Bacteria, UA 12/23/2014 Moderate  Negative- Trace Final  . Epithelial Cells 12/23/2014 Few  Negative- Few Final     RADIOGRAPHIC STUDIES: No results found.  ASSESSMENT/PLAN:    Breast cancer of upper-outer quadrant of right female breast Patient received her last cycle of Taxotere chemotherapy on 12/16/2014.  She presented to the [facility] today to receive IV fluid rehydration.  4.  Chronic dehydration issues following each chemotherapy cycle.  Patient reports developing a fever to maximum of 100.8 over this past weekend.  She denies any URI or UTI symptoms.  She also denies any cough.  Blood counts obtained  today do reveal a WBC of 1.7, ANC 0.7, hemoglobin 9.1, platelet count 121.  Of note-patient was diagnosed and admitted to the hospital for treatment of Klebsiella urinary tract infection with fever and neutropenia in mid June 2016.  Since that time.-Patient has had Keflex and a  biotics, Augmentin, and just finished Macrobid and a biotics on 12/16/2014.  Patient will be admitted to the hospital as a direct admit for further evaluation and management.  Patient is scheduled for an initial radiation oncology consultation on 01/07/2015.  She is scheduled to return to the [facility] for labs and a follow-up visit on 03/17/2015.  Dehydration Patient presented to the [facility] today to receive IV fluid rehydration.  Patient has been receiving scheduled IV rehydration following each chemotherapy cycle recently.  She does admit to feeling slightly dehydrated; but states she tries to continue pushing fluids at home is much as possible.  Neutropenic fever Patient received her last cycle of Taxotere chemotherapy on 12/16/2014.  She presented to the [facility] today to receive IV fluid rehydration for chronic dehydration issues following each chemotherapy cycle.  Patient reports developing a fever to maximum of 100.8 over this past weekend.  She denies any URI or UTI symptoms.  She also denies any cough. Temperature today was 102.0.   Blood counts obtained today do reveal a WBC of 1.7, ANC 0.7, hemoglobin 9.1, platelet count 121.  Of note-patient was diagnosed and admitted to the hospital for treatment of Klebsiella urinary tract infection with fever and neutropenia in mid June 2016.  Since that time.-Patient has had Keflex and a biotics, Augmentin, and just finished Macrobid and a biotics on 12/16/2014.  Patient will be admitted to the hospital as a direct admit for further evaluation and management of neutropenic fever.  Have obtained a urine for urinalysis.  Have also obtained blood cultures 2-with one blood draw from her Port-A-Cath and 1.  Blood culture peripherally.  Pending results on both urine and blood cultures.  Brief history and report were called to Dr. Coralyn Pear hospitalist.  Dr. Coralyn Pear is aware that both urine and blood cultures 2 have been obtained; but no  results as of yet.  Also advised Dr. Coralyn Pear was unable to initiate IV anti-biotics prior to pt being transferred to direct admit room.  Patient was transported to room 1343 via wheelchair.  Per the cancer Center nurse.  Patient is scheduled for an initial radiation oncology consultation on 01/07/2015.  She is scheduled to return to the [facility] for labs and a follow-up visit on 03/17/2015.   Patient stated understanding of all instructions; and was in agreement with this plan of care. The patient knows to call the clinic with any problems, questions or concerns.   Review/collaboration with Dr. Lindi Adie regarding all aspects of patient's visit today.   Total time spent with patient was 40 minutes;  with greater than 75 percent of that time spent in face to face counseling regarding patient's symptoms,  and coordination of care and follow up.  Disclaimer: This note was dictated with voice recognition software. Similar sounding words can inadvertently be transcribed and may not be corrected upon review.   Drue Second, NP 12/24/2014

## 2014-12-24 NOTE — Progress Notes (Signed)
PROGRESS NOTE  Angela Bond KNL:[id number] DOB: May 15, 1946 DOA: 12/23/2014 PCP: Simona Huh, MD   HPI: 69 year old patient with breast cancer (s/p lumpectomy 09/22/14, final chemo treatment on 12/16/14), CAD, HTN, HLD, and depression was admitted after presenting to the [facility] for chemotherapy on 12/23/14 and was found to be febrile and neutropenic. She notes a fever of 102.3 at home on 12/23/14 accompanied with a sore throat, sinus pressure and a mild cough. She recently finished a 10 day course of nitrofurantoin for a UTI and was treated recently for candida vaginitis. She has a history of recurrent pyelonephiritis and vaginitis since starting chemotherapy on 10/15/14.   Subjective / 24 H Interval events Patient reports feeling better overall today, though she is still feeling weak and tired with occasional chills. Her sore throat has improved though is still causing mild discomfort.    Assessment/Plan: Principal Problem:   Neutropenia with fever Active Problems:   Sliding hiatal hernia s/p lap PEH repair   Atrial fibrillation   Hypertension   GERD (gastroesophageal reflux disease)   Depression   Hypotension due to drugs   Shingles   Breast cancer of upper-outer quadrant of right female breast   UTI (lower urinary tract infection)   Anemia due to chemotherapy    Neutropenic fever - Infection likely urinary as patient has a hx of prior UTIs and UA with moderate leukocyte esterase and bacteria.  - Patient is feeling better today overall, mild dysuria - WBC increased to 3.0 from 2.5, ANC inceased to 1.4 from 0.7  - Urine culture pending, sputum culture pending for possible respiratory source of infection as patient complained of sore throat and cough - On Zosyn - Defer to oncology for input regarding Neupogen if ANC decreases - Monitor CBC  Vaginal Candidiasis - Continue fluconazole  Breast Cancer - Followed by oncology. Last chemotherapy treatment was 12/16/14  and patient states she is due to start radiation soon.  Chronic pain - Percocet PRN  Hypokalemia - replete and repeat BMP in am   Anemia - Likely resulting from chemotherapy. Hgb decreased to 7.6 from 9.1 - consider transfusion if Hbg<7 or patient becomes symptomatic - Monitor CBC  Depression - On fluoxetine and ativan  Neuropathy - On gabapentin  Osteoporosis osteopenia - On Calcium carbonate 600 BID, Hold Vitamin D  GERD - Hx of hiatal hernia repair, On pantoprazole  Chronic constipation - On miralax, add sorbitol if needed   Diet: Diet regular Room service appropriate?: Yes; Fluid consistency:: Thin Fluids: IVF DVT Prophylaxis: Heparin  Code Status: Full Code Family Communication: None at bedside Disposition Plan: Remain inpatient  Consultants:  None  Procedures:  None   Antibiotics Zosyn 8/9 >>   Studies  No results found.  Objective  Filed Vitals:   12/23/14 1437 12/23/14 1440 12/23/14 2107 12/24/14 0609  BP: 130/55  125/58 127/62  Pulse: 99  71 77  Temp: 100.8 F (38.2 C)  98.6 F (37 C) 98.2 F (36.8 C)  TempSrc: Oral  Oral Oral  Resp: 20  20 20   Height: 5' (1.524 m)     Weight:  74.027 kg (163 lb 3.2 oz)    SpO2: 96%  97% 97%    Intake/Output Summary (Last 24 hours) at 12/24/14 1138 Last data filed at 12/24/14 0940  Gross per 24 hour  Intake   2165 ml  Output   2550 ml  Net   -385 ml   Filed Weights   12/23/14 1440  Weight: 74.027 kg (163 lb 3.2 oz)    Exam:  GENERAL: Patient seated comfortably in bed in no acute distress. She is pleasant with clear and fluent speech.  HEENT: Head is normocephalic and atraumatic. Pupils are equal, round, and reactive to light and accommodation. Mild tenderness bilateral sinus points  NECK: Supple. No lymphadenopathy  LUNGS: Clear to auscultation. No wheezing or crackles  HEART: Regular rate and rhythm without murmur. 2+ pulses, no JVD, no peripheral edema  ABDOMEN: Soft, nontender,  and nondistended. Positive bowel sounds.  EXTREMITIES: Without any cyanosis, clubbing, rash, lesions or edema.  NEUROLOGIC: She is alert and oriented x3.   PSYCHIATRIC: Normal mood and affect  SKIN: No ulceration or induration present.   Data Reviewed: Basic Metabolic Panel:  Recent Labs Lab 12/23/14 1035 12/23/14 1650 12/24/14 0615  NA 140  --  141  K 3.5  --  3.2*  CL  --   --  107  CO2 29  --  28  GLUCOSE 107  --  86  BUN 6.9*  --  6  CREATININE 0.7 0.70 0.67  CALCIUM 8.6  --  7.8*   Liver Function Tests:  Recent Labs Lab 12/23/14 1035 12/24/14 0615  AST 12 13*  ALT 13 13*  ALKPHOS 73 62  BILITOT 0.71 0.5  PROT 6.0* 5.5*  ALBUMIN 3.0* 2.6*   CBC:  Recent Labs Lab 12/23/14 1035 12/23/14 1650 12/24/14 0615  WBC 1.7* 2.5* 3.0*  NEUTROABS 0.7*  --  1.4*  HGB 9.1* 8.1* 7.6*  HCT 27.7* 25.6* 24.2*  MCV 84.1 86.2 86.4  PLT 121* 125* 111*    Recent Results (from the past 240 hour(s))  Rapid strep screen (not at [facility])     Status: None   Collection Time: 12/24/14  6:07 AM  Result Value Ref Range Status   Streptococcus, Group A Screen (Direct) NEGATIVE NEGATIVE Final  MRSA PCR Screening     Status: Abnormal   Collection Time: 12/24/14  7:58 AM  Result Value Ref Range Status   MRSA by PCR POSITIVE (A) NEGATIVE Final    Comment:        The GeneXpert MRSA Assay (FDA approved for NASAL specimens only), is one component of a comprehensive MRSA colonization surveillance program. It is not intended to diagnose MRSA infection nor to guide or monitor treatment for MRSA infections. RESULT CALLED TO, READ BACK BY AND VERIFIED WITH: K. OSBOURNE RN AT [phone] ON 08.10.16 BY SHUEA      Scheduled Meds: . calcium carbonate  625 mg Oral BID WC  . Chlorhexidine Gluconate Cloth  6 each Topical Q0600  . ferrous sulfate  325 mg Oral BID WC  . fluconazole  200 mg Oral Daily  . FLUoxetine  20 mg Oral Daily  . gabapentin  300 mg Oral QHS  . heparin  5,000 Units  Subcutaneous 3 times per day  . loratadine  10 mg Oral Daily  . LORazepam  0.5 mg Oral QHS  . meloxicam  15 mg Oral Daily  . mupirocin ointment  1 application Nasal BID  . nystatin  5 mL Oral QID  . pantoprazole  40 mg Oral Daily  . piperacillin-tazobactam (ZOSYN)  IV  3.375 g Intravenous Q8H  . saccharomyces boulardii  250 mg Oral BID   Continuous Infusions: . sodium chloride 100 mL/hr at 12/24/14 0145     Fleet Contras, PA-S   Marzetta Board, MD [facility] Pager [phone]. If 7 PM - 7 AM, please  contact night-coverage at [email or web address], password [facility] 12/24/2014, 11:38 AM  LOS: 1 day

## 2014-12-24 NOTE — Assessment & Plan Note (Signed)
Patient presented to the [facility] today to receive IV fluid rehydration.  Patient has been receiving scheduled IV rehydration following each chemotherapy cycle recently.  She does admit to feeling slightly dehydrated; but states she tries to continue pushing fluids at home is much as possible.

## 2014-12-24 NOTE — Progress Notes (Signed)
HEMATOLOGY-ONCOLOGY PROGRESS NOTE  SUBJECTIVE:Low grade temp 28F, Feeling much better  OBJECTIVE: PHYSICAL EXAMINATION: ECOG PERFORMANCE STATUS: 3 - Symptomatic, >50% confined to bed  Filed Vitals:   12/24/14 1337  BP: 143/69  Pulse: 81  Temp: 99.1 F (37.3 C)  Resp: 18   Filed Weights   12/23/14 1440  Weight: 163 lb 3.2 oz (74.027 kg)    GENERAL:alert, no distress and comfortable SKIN: skin color, texture, turgor are normal, no rashes or significant lesions EYES: normal, Conjunctiva are pink and non-injected, sclera clear OROPHARYNX:no exudate, no erythema and lips, buccal mucosa, and tongue normal  NECK: supple, thyroid normal size, non-tender, without nodularity LYMPH:  no palpable lymphadenopathy in the cervical, axillary or inguinal LUNGS: clear to auscultation and percussion with normal breathing effort HEART: regular rate & rhythm and no murmurs and no lower extremity edema ABDOMEN:abdomen soft, non-tender and normal bowel sounds Musculoskeletal:no cyanosis of digits and no clubbing  NEURO: alert & oriented x 3 with fluent speech, no focal motor/sensory deficits  LABORATORY DATA:  I have reviewed the data as listed CMP Latest Ref Rng 12/24/2014 12/23/2014 12/23/2014  Glucose 65 - 99 mg/dL 86 107 -  BUN 6 - 20 mg/dL 6 6.9(L) -  Creatinine 0.44 - 1.00 mg/dL 0.67 0.70 0.7  Sodium 135 - 145 mmol/L 141 140 -  Potassium 3.5 - 5.1 mmol/L 3.2(L) 3.5 -  Chloride 101 - 111 mmol/L 107 - -  CO2 22 - 32 mmol/L 28 29 -  Calcium 8.9 - 10.3 mg/dL 7.8(L) 8.6 -  Total Protein 6.5 - 8.1 g/dL 5.5(L) 6.0(L) -  Total Bilirubin 0.3 - 1.2 mg/dL 0.5 0.71 -  Alkaline Phos 38 - 126 U/L 62 73 -  AST 15 - 41 U/L 13(L) 12 -  ALT 14 - 54 U/L 13(L) 13 -    Lab Results  Component Value Date   WBC 3.0* 12/24/2014   HGB 7.6* 12/24/2014   HCT 24.2* 12/24/2014   MCV 86.4 12/24/2014   PLT 111* 12/24/2014   NEUTROABS 1.4* 12/24/2014    ASSESSMENT AND PLAN: 1. Neutropenic fever: with UTI; On  Zosyn. [place] recovering. Its upto 1.4 No need of neupogen 2. Anemia: Sec to chemo. Agree with 1 unit of PRBC Will follow her as out patient after DC

## 2014-12-25 LAB — CBC WITH DIFFERENTIAL/PLATELET
Basophils Absolute: 0.1 10*3/uL (ref 0.0–0.1)
Basophils Relative: 1 % (ref 0–1)
Eosinophils Absolute: 0.1 10*3/uL (ref 0.0–0.7)
Eosinophils Relative: 1 % (ref 0–5)
HCT: 29.3 % — ABNORMAL LOW (ref 36.0–46.0)
Hemoglobin: 9.5 g/dL — ABNORMAL LOW (ref 12.0–15.0)
LYMPHS ABS: 1.3 10*3/uL (ref 0.7–4.0)
Lymphocytes Relative: 15 % (ref 12–46)
MCH: 28.2 pg (ref 26.0–34.0)
MCHC: 32.4 g/dL (ref 30.0–36.0)
MCV: 86.9 fL (ref 78.0–100.0)
MONO ABS: 0.6 10*3/uL (ref 0.1–1.0)
MONOS PCT: 7 % (ref 3–12)
Neutro Abs: 6.4 10*3/uL (ref 1.7–7.7)
Neutrophils Relative %: 76 % (ref 43–77)
PLATELETS: 152 10*3/uL (ref 150–400)
RBC: 3.37 MIL/uL — ABNORMAL LOW (ref 3.87–5.11)
RDW: 18.9 % — AB (ref 11.5–15.5)
WBC: 8.5 10*3/uL (ref 4.0–10.5)

## 2014-12-25 LAB — TYPE AND SCREEN
ABO/RH(D): A NEG
ANTIBODY SCREEN: NEGATIVE
Unit division: 0

## 2014-12-25 LAB — BASIC METABOLIC PANEL
ANION GAP: 7 (ref 5–15)
BUN: <5 mg/dL — ABNORMAL LOW (ref 6–20)
CALCIUM: 7.9 mg/dL — AB (ref 8.9–10.3)
CO2: 27 mmol/L (ref 22–32)
Chloride: 107 mmol/L (ref 101–111)
Creatinine, Ser: 0.79 mg/dL (ref 0.44–1.00)
GFR calc Af Amer: >60 mL/min (ref >60)
GFR calc non Af Amer: >60 mL/min (ref >60)
GLUCOSE: 93 mg/dL (ref 65–99)
POTASSIUM: 3.5 mmol/L (ref 3.5–5.1)
Sodium: 141 mmol/L (ref 135–145)

## 2014-12-25 LAB — URINE CULTURE

## 2014-12-25 MED ORDER — HEPARIN SOD (PORK) LOCK FLUSH 100 UNIT/ML IV SOLN
500.0000 [IU] | Freq: Once | INTRAVENOUS | Status: DC
  Filled 2014-12-25: qty 5

## 2014-12-25 MED ORDER — LEVOFLOXACIN 500 MG PO TABS: 500.0000 mg | ORAL_TABLET | Freq: Every day | ORAL | Status: DC

## 2014-12-25 MED ORDER — NITROFURANTOIN MONOHYD MACRO 100 MG PO CAPS: 100.0000 mg | ORAL_CAPSULE | Freq: Two times a day (BID) | ORAL | Status: DC

## 2014-12-25 MED ORDER — FLUCONAZOLE 150 MG PO TABS: 150.0000 mg | ORAL_TABLET | Freq: Once | ORAL | Status: DC

## 2014-12-25 NOTE — Care Management Important Message (Signed)
Important Message  Patient Details  Name: Angela Bond MRN: [id number] Date of Birth: December 20, 1945   Medicare Important Message Given:  [facility] notification given    Camillo Flaming 12/25/2014, 12:42 [place] Message  Patient Details  Name: Angela Bond MRN: [id number] Date of Birth: 1946/04/15   Medicare Important Message Given:  Yes-second notification given    Camillo Flaming 12/25/2014, 12:42 PM

## 2014-12-25 NOTE — Discharge Summary (Signed)
Physician Discharge Summary  Angela Bond JQB:[id number] DOB: [birth year] DOA: 12/23/2014  PCP: Simona Huh, MD  Admit date: 12/23/2014 Discharge date: 12/25/2014  Time spent: > 30 minutes  Recommendations for Outpatient Follow-up:  1. Follow up with Dr. Pablo Ledger as scheduled in 2 weeks 2. Follow up with Dr. Marisue Humble in 1-2 weeks  Discharge Diagnoses:  Principal Problem:   Neutropenia with fever Active Problems:   Sliding hiatal hernia s/p lap PEH repair   Atrial fibrillation   Hypertension   GERD (gastroesophageal reflux disease)   Depression   Hypotension due to drugs   Shingles   Breast cancer of upper-outer quadrant of right female breast   UTI (lower urinary tract infection)   Anemia due to chemotherapy  Discharge Condition: stable  Diet recommendation: regular  Filed Weights   12/23/14 1440  Weight: 74.027 kg (163 lb 3.2 oz)    History of present illness:  69 year old patient with breast cancer (s/p lumpectomy 09/22/14, most recent chemo treatment on 12/16/14), CAD, HTN, HLD, and depression was admitted after presenting to [facility] for chemotherapy on 12/23/14 where she was found to be febrile and neutropenic. She notes a fever of 102.3 at home on 12/23/14 accompanied with a sore throat, sinus pressure and mild cough. She recently finished a 10 day course nitrofurantoin for a UTI and was treated recently for candida vaginitis. She has a history of recurrent pyelonephritis and vaginitis since starting chemotherapy on 10/15/14. She was admitted by [facility] on 12/23/14.  Hospital Course:  Neutropenic fever - patient was admitted to the hospital with fever of 100.57F and neutropenia. Her initial workup was significant for history of prior UTIs and initial UA appeared positive for a UTI. She was started empirically on Zosyn with subsequent clinical improvement. Her UA eventually speciated Klebsiella which was resistant to Ampicillin otherwise sensitive.  On the day of discharge, I was contacted by Dr. Marisue Humble that UA and cultures obtained in his office prior to her hospitalization grew Pseudomonas which was sensitive to levofloxacin. Patient's antibiotics were narrowed to levofloxacin on discharge and she is to continue 7 additional days. Her neutropenia improved and WNC and ANC normalized on discharge. She was afebrile, clinically improved, her care was discussed with Dr. Lindi Adie, and she will be discharged home in stable condition with close outpatient follow up with Oncology and Radiation Oncology.  Anemia - Hgb 7.6 likely due to pancytopenia in the setting of chemotherapy, so signs of overt bleeding, Hbg improved to 9.5 after 1U pRBCn Breast cancer - Followed by oncology, outpatient follow up, she has finished chemotherapy and is scheduled to begin XRT with Dr. Pablo Ledger.    Procedures:  None  Consultations:  None  Discharge Exam: Filed Vitals:   12/24/14 1947 12/24/14 2018 12/24/14 2240 12/25/14 0610  BP: 131/78 123/80 122/84 133/79  Pulse: 76 79 75 82  Temp: 98.5 F (36.9 C) 98.6 F (37 C) 98.3 F (36.8 C) 99.3 F (37.4 C)  TempSrc: Oral Oral Oral Oral  Resp: 18 18 18 18   Height:      Weight:      SpO2: 99% 97% 99% 97%   General: Patient seated comfortably in bed in no acute distress. She is alert with clear and fluent speech. Cardiovascular: Regular rate and rhythm. 2+ DP pulses, no LE edema Respiratory: Clear to auscultation bilaterally, normal respiratory effort  Discharge Instructions    Medication List    TAKE these medications  calcium carbonate 600 MG Tabs tablet  Commonly known as:  OS-CAL  Take 600 mg by mouth 2 (two) times daily with a meal.     carboxymethylcellulose 0.5 % Soln  Commonly known as:  REFRESH PLUS  Place 1 drop into both eyes every 3 (three) hours.     dexamethasone 4 MG tablet  Commonly known as:  DECADRON  Take 1 tablet (4 mg total) by mouth 2 (two) times daily. Start the day  before Taxotere. Then again the day after chemo for 3 days.     dexlansoprazole 60 MG capsule  Commonly known as:  DEXILANT  Take 60 mg by mouth daily.     ferrous sulfate 325 (65 FE) MG tablet  Take 1 tablet (325 mg total) by mouth 2 (two) times daily with a meal.     fexofenadine 180 MG tablet  Commonly known as:  ALLEGRA  Take 180 mg by mouth daily.     fluconazole 200 MG tablet  Commonly known as:  DIFLUCAN  Take 1 tablet (200 mg total) by mouth daily.     fluconazole 150 MG tablet  Commonly known as:  DIFLUCAN  Take 1 tablet (150 mg total) by mouth once.     FLUoxetine 20 MG capsule  Commonly known as:  PROZAC  Take 20 mg by mouth daily.     gabapentin 300 MG capsule  Commonly known as:  NEURONTIN  Take 300 mg by mouth at bedtime.     levofloxacin 500 MG tablet  Commonly known as:  LEVAQUIN  Take 1 tablet (500 mg total) by mouth daily.     lidocaine-prilocaine cream  Commonly known as:  EMLA  Apply to affected area once     LORazepam 0.5 MG tablet  Commonly known as:  ATIVAN  Take 1 tablet (0.5 mg total) by mouth at bedtime.     lovastatin 40 MG tablet  Commonly known as:  MEVACOR  Take 40 mg by mouth daily before breakfast.     meclizine 25 MG tablet  Commonly known as:  ANTIVERT  Take 12.5-25 mg by mouth 3 (three) times daily as needed for dizziness.     meloxicam 15 MG tablet  Commonly known as:  MOBIC  Take 15 mg by mouth daily.     nystatin 100000 UNIT/ML suspension  Commonly known as:  MYCOSTATIN  Take 5 mLs (500,000 Units total) by mouth 4 (four) times daily.     ondansetron 8 MG tablet  Commonly known as:  ZOFRAN  Take 1 tablet (8 mg total) by mouth 2 (two) times daily. Start the day after chemo for 3 days. Then take as needed for nausea or vomiting.     oxyCODONE-acetaminophen 5-325 MG per tablet  Commonly known as:  ROXICET  Take 1-2 tablets by mouth every 4 (four) hours as needed.     polyethylene glycol packet  Commonly known as:   MIRALAX / GLYCOLAX  Take 17 g by mouth as needed for mild constipation.     PRESCRIPTION MEDICATION  Chemo; [place].     prochlorperazine 10 MG tablet  Commonly known as:  COMPAZINE  Take 1 tablet (10 mg total) by mouth every 6 (six) hours as needed (Nausea or vomiting).     saccharomyces boulardii 250 MG capsule  Commonly known as:  FLORASTOR  Take 1 capsule (250 mg total) by mouth 2 (two) times daily.     sodium chloride 0.65 % Soln nasal spray  Commonly known as:  OCEAN  Place 2 sprays into both nostrils as needed for congestion.     Vitamin D 2000 UNITS tablet  Take 2,000 Units by mouth 2 (two) times daily.           Follow-up Information    Follow up with Simona Huh, MD. Schedule an appointment as soon as possible for a visit in 1 week.   Specialty:  Family Medicine   Contact information:   [address] [place] [address] [phone]       Follow up with Rulon Eisenmenger, MD. Schedule an appointment as soon as possible for a visit in 2 weeks.   Specialty:  Hematology and Oncology   Contact information:   [place] [phone]      The results of significant diagnostics from this hospitalization (including imaging, microbiology, ancillary and laboratory) are listed below for reference.    Significant Diagnostic Studies: No results found.  Microbiology: Recent Results (from the past 240 hour(s))  Urine culture     Status: None   Collection Time: 12/23/14  5:55 PM  Result Value Ref Range Status   Specimen Description URINE, RANDOM  Final   Special Requests Immunocompromised  Final   Culture   Final    >=100,000 COLONIES/mL KLEBSIELLA PNEUMONIAE Performed at [facility]    Report Status 12/25/2014 FINAL  Final   Organism ID, Bacteria KLEBSIELLA PNEUMONIAE  Final      Susceptibility   Klebsiella pneumoniae - MIC*    AMPICILLIN 16 RESISTANT Resistant     CEFAZOLIN <=4 SENSITIVE Sensitive      CEFTRIAXONE <=1 SENSITIVE Sensitive     CIPROFLOXACIN <=0.25 SENSITIVE Sensitive     GENTAMICIN <=1 SENSITIVE Sensitive     IMIPENEM <=0.25 SENSITIVE Sensitive     NITROFURANTOIN <=16 SENSITIVE Sensitive     TRIMETH/SULFA <=20 SENSITIVE Sensitive     AMPICILLIN/SULBACTAM 4 SENSITIVE Sensitive     PIP/TAZO <=4 SENSITIVE Sensitive     * >=100,000 COLONIES/mL KLEBSIELLA PNEUMONIAE  Rapid strep screen (not at [facility])     Status: None   Collection Time: 12/24/14  6:07 AM  Result Value Ref Range Status   Streptococcus, Group A Screen (Direct) NEGATIVE NEGATIVE Final  MRSA PCR Screening     Status: Abnormal   Collection Time: 12/24/14  7:58 AM  Result Value Ref Range Status   MRSA by PCR POSITIVE (A) NEGATIVE Final    Comment:        The GeneXpert MRSA Assay (FDA approved for NASAL specimens only), is one component of a comprehensive MRSA colonization surveillance program. It is not intended to diagnose MRSA infection nor to guide or monitor treatment for MRSA infections. RESULT CALLED TO, READ BACK BY AND VERIFIED WITH: Floria Raveling RN AT [phone] ON 08.10.16 BY SHUEA      Labs: Basic Metabolic Panel:  Recent Labs Lab 12/23/14 1035 12/23/14 1650 12/24/14 0615 12/25/14 0750  NA 140  --  141 141  K 3.5  --  3.2* 3.5  CL  --   --  107 107  CO2 29  --  28 27  GLUCOSE 107  --  86 93  BUN 6.9*  --  6 <5*  CREATININE 0.7 0.70 0.67 0.79  CALCIUM 8.6  --  7.8* 7.9*   Liver Function Tests:  Recent Labs Lab 12/23/14 1035 12/24/14 0615  AST 12 13*  ALT 13 13*  ALKPHOS 73 62  BILITOT 0.71  0.5  PROT 6.0* 5.5*  ALBUMIN 3.0* 2.6*   CBC:  Recent Labs Lab 12/23/14 1035 12/23/14 1650 12/24/14 0615 12/25/14 0750  WBC 1.7* 2.5* 3.0* 8.5  NEUTROABS 0.7*  --  1.4* 6.4  HGB 9.1* 8.1* 7.6* 9.5*  HCT 27.7* 25.6* 24.2* 29.3*  MCV 84.1 86.2 86.4 86.9  PLT 121* 125* 111* 152   Signed:  Fleet Contras, PA-S  Marzetta Board, MD  [facility] 12/25/2014, 1:54 PM

## 2014-12-25 NOTE — Care Management Note (Signed)
Case Management Note  Patient Details  Name: Angela Bond MRN: [id number] Date of Birth: May 04, 1946  Subjective/Objective:             69 yo admitted with Neutropenia and fever.       Action/Plan: From home with spouse  Expected Discharge Date:   (unknown)               Expected Discharge Plan:  Home/Self Care  In-House Referral:  NA  Discharge planning Services  CM Consult  Post Acute Care Choice:    Choice offered to:     DME Arranged:    DME Agency:     HH Arranged:    [place] Agency:     Status of Service:  Completed, signed off  Medicare Important Message Given:  Yes-second notification given Date Medicare IM Given:    Medicare IM give by:    Date Additional Medicare IM Given:    Additional Medicare Important Message give by:     If discussed at [place] of Stay Meetings, dates discussed:    Additional Comments: Chart reviewed and no CM needs noted. Lynnell Catalan, RN 12/25/2014, 1:00 PM

## 2014-12-25 NOTE — Discharge Instructions (Signed)
Follow with Angela Huh, MD in 5-7 days  Please get a complete blood count and chemistry panel checked by your Primary MD at your next visit, and again as instructed by your Primary MD. Please get your medications reviewed and adjusted by your Primary MD.  Please request your Primary MD to go over all Hospital Tests and Procedure/Radiological results at the follow up, please get all Hospital records sent to your Prim MD by signing hospital release before you go home.  If you had Pneumonia of Lung problems at the Hospital: Please get a 2 view Chest X ray done in 6-8 weeks after hospital discharge or sooner if instructed by your Primary MD.  If you have Congestive Heart Failure: Please call your Cardiologist or Primary MD anytime you have any of the following symptoms:  1) 3 pound weight gain in 24 hours or 5 pounds in 1 week  2) shortness of breath, with or without a dry hacking cough  3) swelling in the hands, feet or stomach  4) if you have to sleep on extra pillows at night in order to breathe  Follow cardiac low salt diet and 1.5 lit/day fluid restriction.  If you have diabetes Accuchecks 4 times/day, Once in AM empty stomach and then before each meal. Log in all results and show them to your primary doctor at your next visit. If any glucose reading is under 80 or above 300 call your primary MD immediately.  If you have Seizure/Convulsions/Epilepsy: Please do not drive, operate heavy machinery, participate in activities at heights or participate in high speed sports until you have seen by Primary MD or a Neurologist and advised to do so again.  If you had Gastrointestinal Bleeding: Please ask your Primary MD to check a complete blood count within one week of discharge or at your next visit. Your endoscopic/colonoscopic biopsies that are pending at the time of discharge, will also need to followed by your Primary MD.  Get Medicines reviewed and adjusted. Please take all your  medications with you for your next visit with your Primary MD  Please request your Primary MD to go over all hospital tests and procedure/radiological results at the follow up, please ask your Primary MD to get all Hospital records sent to his/her office.  If you experience worsening of your admission symptoms, develop shortness of breath, life threatening emergency, suicidal or homicidal thoughts you must seek medical attention immediately by calling 911 or calling your MD immediately  if symptoms less severe.  You must read complete instructions/literature along with all the possible adverse reactions/side effects for all the Medicines you take and that have been prescribed to you. Take any new Medicines after you have completely understood and accpet all the possible adverse reactions/side effects.   Do not drive or operate heavy machinery when taking Pain medications.   Do not take more than prescribed Pain, Sleep and Anxiety Medications  Special Instructions: If you have smoked or chewed Tobacco  in the last 2 yrs please stop smoking, stop any regular Alcohol  and or any Recreational drug use.  Wear Seat belts while driving.  Please note You were cared for by a hospitalist during your hospital stay. If you have any questions about your discharge medications or the care you received while you were in the hospital after you are discharged, you can call the unit and asked to speak with the hospitalist on call if the hospitalist that took care of you is not available. Once  you are discharged, your primary care physician will handle any further medical issues. Please note that NO REFILLS for any discharge medications will be authorized once you are discharged, as it is imperative that you return to your primary care physician (or establish a relationship with a primary care physician if you do not have one) for your aftercare needs so that they can reassess your need for medications and monitor your  lab values.  You can reach the hospitalist office at phone [phone] or fax [phone]   If you do not have a primary care physician, you can call [phone] for a physician referral.  Activity: As tolerated with Full fall precautions use walker/cane & assistance as needed  Diet: regular  Disposition Home

## 2014-12-26 ENCOUNTER — Telehealth: Payer: Self-pay

## 2014-12-26 LAB — CULTURE, GROUP A STREP: STREP A CULTURE: NEGATIVE

## 2014-12-26 LAB — URINE CULTURE

## 2014-12-26 NOTE — Telephone Encounter (Signed)
Returned pt call.  Let her know, per Dr. Lindi Adie, he does not need to see her until her 11/1 appt.  Pt voiced understanding.

## 2014-12-29 LAB — CULTURE, BLOOD (SINGLE)

## 2014-12-31 ENCOUNTER — Other Ambulatory Visit: Payer: Self-pay | Admitting: Hematology and Oncology

## 2015-01-02 ENCOUNTER — Other Ambulatory Visit: Payer: Self-pay | Admitting: Hematology and Oncology

## 2015-01-02 ENCOUNTER — Other Ambulatory Visit: Payer: Self-pay | Admitting: *Deleted

## 2015-01-02 DIAGNOSIS — C50411 Malignant neoplasm of upper-outer quadrant of right female breast: Secondary | ICD-10-CM

## 2015-01-02 MED ORDER — LORAZEPAM 0.5 MG PO TABS: 0.5000 mg | ORAL_TABLET | Freq: Every day | ORAL | Status: DC

## 2015-01-05 NOTE — Progress Notes (Signed)
Location of Breast Cancer: Right upper-outer quadrant.grade III  Histology per Pathology Report: 09/22/2014 Diagnosis 1. Lymph node, sentinel, biopsy, right axillary #1 - METASTATIC CARCINOMA IN 1 OF 1 LYMPH NODE (1/1). 2. Lymph node, sentinel, biopsy, right axillary #2 - THERE IS NO EVIDENCE OF CARCINOMA IN 1 OF 1 LYMPH NODE (0/1). 3. 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 - DUCTAL CARCINOMA IN SITU WITH CALCIFICATIONS, HIGH GRADE. - LYMPHOVASCULAR INVASION IS IDENTIFIED. - THE SURGICAL RESECTION MARGINS ARE NEGATIVE FOR CARCINOMA.  Receptor Status: ER(0%), PR (0%), Her2-neu (-) triple negative  Did patient present with symptoms (if so, please note symptoms) or was this found on screening mammography?: painful mass found on palpation by patient.  Past/Anticipated interventions by surgeon, if TXQ:HSFJF breast lumpectomy and right axillary sentinel lymph node biopsy on 09/22/14 by Dr.Paul Marlou Starks  Past/Anticipated interventions by medical oncology, if any: Chemotherapy consisted of taxotere and cytoxan x 4 cycles.Last treatment on 12/16/2014.  Lymphedema issues, if any: No   Pain issues, if any:No  SAFETY ISSUES:  Prior radiation? No  Pacemaker/ICD? No   Possible current pregnancy? post-menopausal.  Is the patient on methotrexate? No  Current Complaints / other details:Menarche age 65.Gravida 1.born at age 82.No HRT.States she is having some nipple discharge. History of anxiety and mild depression "just cries" overwhelmed husband had lung surgery and bills. Breast cancer: sister diagnosed at age 65 remission for 28 years died age 47. Several cousins with breast cancer.  Allergies:shellfish, lactose intolerant, aleve, aspirin, doxycycline,evista, fosamax, pravachol, sulfa and whelchol.  Awaiting urinalysis results from PCP on 01/05/15. MRSA + 12/24/14. BP 152/89 mmHg  Pulse 76  Temp(Src) 99.4 F (37.4 C)  Wt 161 lb 9.6 oz (73.301 kg)   SpO2 99%    Arlyss Repress, RN 01/05/2015,1:27 PM

## 2015-01-07 ENCOUNTER — Encounter: Payer: Self-pay | Admitting: Radiation Oncology

## 2015-01-07 ENCOUNTER — Ambulatory Visit
Admission: RE | Admit: 2015-01-07 | Discharge: 2015-01-07 | Disposition: A | Discharge status: Home / Self Care | Payer: Medicare Other | Source: Ambulatory Visit | Attending: Radiation Oncology | Admitting: Radiation Oncology

## 2015-01-07 VITALS — BP 152/89 | HR 76 | Temp 99.4°F | Wt 161.6 lb

## 2015-01-07 DIAGNOSIS — Z9221 Personal history of antineoplastic chemotherapy: Secondary | ICD-10-CM | POA: Insufficient documentation

## 2015-01-07 DIAGNOSIS — C50411 Malignant neoplasm of upper-outer quadrant of right female breast: Secondary | ICD-10-CM

## 2015-01-07 DIAGNOSIS — C50911 Malignant neoplasm of unspecified site of right female breast: Secondary | ICD-10-CM | POA: Insufficient documentation

## 2015-01-07 DIAGNOSIS — Z510 Encounter for antineoplastic radiation therapy: Secondary | ICD-10-CM | POA: Insufficient documentation

## 2015-01-07 DIAGNOSIS — Z9889 Other specified postprocedural states: Secondary | ICD-10-CM | POA: Diagnosis not present

## 2015-01-07 NOTE — Progress Notes (Signed)
Please see the Nurse Progress Note in the MD Initial Consult Encounter for this patient. 

## 2015-01-07 NOTE — Progress Notes (Signed)
Radiation Oncology         [phone]) [phone] ________________________________  Initial outpatient Consultation - Date: 01/07/2015   Name: Angela Bond MRN: [id number]   DOB: 31-Jan-1946  REFERRING PHYSICIAN: Nicholas Lose, MD  DIAGNOSIS AND STAGE: Right Breast Cancer, T2N1a   HISTORY OF PRESENT ILLNESS::Angela Bond is a 69 y.o. female who palpated a mass in her right breast. Subsequent mammogram on 07/30/14 revealed a 1.7 cm mass, this was 4 cm from the nipple. The patient underwent lumpectomy of the right breast on 09/22/14. Biopsy of the 2.6 cm mass revealed IDC grade III, triple negative. She completed 4 cycles of adjuvant TC chemotherapy and was hospitalized for klebsiella UTI. She also had severe fatigue.  Her husband also had a lung resection during her chemotherapy which has really added to their stress. She is upset today in clinic stating "i'm over it" and "why don't I just run in front of a car like my niece did last week"  She states "I'm depressed and not sleeping".  She has seen her PCP but has not discussed these depressive symptoms with him.  She is concerned about her finances and all the bills they have coming in for medical care.  She is also "sure" radiation is going to "burn me up". She has good range of motion in her bilateral upper extremeties.  She has no edema of arm pain.       Breast cancer of upper-outer quadrant of right female breast   07/30/2014 Mammogram Right breast 10 o'clock location 4 cm from the nipple measuring 1.7 x 1.3 x 1.3 cm. This corresponds to the palpable and mammographic finding. In the right axilla, there are several lymph nodes which are normal in size measuring up to 2mm.   08/27/2014 Initial Diagnosis Right Breast 10:00: IDC with LVI, Grade 3, Er 0%, PR 0%, Her 2 Neg, Ki 67: 30%   09/22/2014 Surgery Rt Lumpectomy: IDC grade 3, Size 2.6 cm, with HG DCIS, LVI present, ER/PR: 0%; 1/2 LN Positive: T2N1M0 Stage 2B   10/15/2014 -  Chemotherapy Adjuvant  chemotherapy with Taxotere Cytoxan 4 q3 weeks   10/21/2014 - 10/24/2014 Hospital Admission Admission for neutropenic fever UTI Klebsiella     PREVIOUS RADIATION THERAPY: No  Past medical, social and family history were reviewed in the electronic chart. Review of symptoms was reviewed in the electronic chart. Medications were reviewed in the electronic chart.   PHYSICAL EXAM:  Filed Vitals:   01/07/15 1356  BP: 152/89  Pulse: 76  Temp: 99.4 F (37.4 C)  .161 lb 9.6 oz (73.301 kg). Angry female. No respiratory distress. Well healed incision over right breast. No lymphedeam bilaterally. Good range of motion in bilateral upper extremities.   IMPRESSION: Right Breast Cancer, T2N1a  PLAN:I spoke to the patient today regarding her diagnosis and options for treatment. We discussed the equivalence in terms of survival and local failure between mastectomy and breast conservation. We discussed the role of radiation in decreasing local failures in patients who undergo lumpectomy. We discussed the process of simulation and the placement tattoos. We discussed 6 weeks of treatment as an outpatient. We discussed the possibility of asymptomatic lung damage. We discussed the low likelihood of secondary malignancies. We discussed the possible side effects including but not limited to skin redness, fatigue, permanent skin darkening, and breast swelling.    She is scheduled for simulation 01/14/15 at 3 pm.  She will meet with a financial counselor after simulation.  She will  contact her PCP in regards to her depressive symptoms. She confirmed with me that she "was just talking" and did not have an actual plan to commit suicide.  I will refer her to social work for counseling as well.   I spent 60 minutes  face to face with the patient and more than 50% of that time was spent in counseling and/or coordination of care.   ------------------------------------------------  Thea Silversmith, MD  This document  serves as a record of services personally performed by Thea Silversmith, MD. It was created on her behalf by Derek Mound, a trained medical scribe. The creation of this record is based on the scribe's personal observations and the provider's statements to them. This document has been checked and approved by the attending provider.

## 2015-01-08 ENCOUNTER — Ambulatory Visit: Payer: Medicare Other | Admitting: Radiation Oncology

## 2015-01-13 ENCOUNTER — Encounter: Payer: Self-pay | Admitting: *Deleted

## 2015-01-13 NOTE — Progress Notes (Signed)
[facility] Psychosocial Distress Screening Clinical Social Work  Clinical Social Work was referred by distress screening protocol.  The patient scored a 6 on the Psychosocial Distress Thermometer which indicates moderate distress. Clinical Social Worker contacted patient at home to assess for distress and other psychosocial needs.  Patient stated she was "ok", but was experiencing feelings of depression and anger due to "everything that she has been through".  Patient also expressed feeling overwhelmed due to her and her husbands medical bills.  Patients husband recently had surgery and can only offer very limited support to patient.  CSW and patient discussed support services at [facility] and patient was agreeable to a counseling referral to our [facility] counseling intern.  Patient also expressed interest in support groups.  Patient plans to contact CSW regarding her bill/financial needs once she begins radiation.  CSw encouraged patient to call with any questions or concerns.                ONCBCN DISTRESS SCREENING 01/07/2015  Screening Type Initial Screening  Distress experienced in past week (1-10) 6  Family Problem type Other (comment)  Emotional problem type Depression;Nervousness/Anxiety    Johnnye Lana, MSW, LCSW, OSW-C Clinical Social Worker [facility] [phone]

## 2015-01-14 ENCOUNTER — Ambulatory Visit
Admission: RE | Admit: 2015-01-14 | Discharge: 2015-01-14 | Disposition: A | Discharge status: Home / Self Care | Payer: Medicare Other | Source: Ambulatory Visit | Attending: Radiation Oncology | Admitting: Radiation Oncology

## 2015-01-14 DIAGNOSIS — C50411 Malignant neoplasm of upper-outer quadrant of right female breast: Secondary | ICD-10-CM

## 2015-01-14 DIAGNOSIS — Z510 Encounter for antineoplastic radiation therapy: Secondary | ICD-10-CM | POA: Diagnosis not present

## 2015-01-14 NOTE — Progress Notes (Signed)
Name: Angela Bond   MRN: [id number]  Date:  01/14/2015  DOB: 1946-02-10  Status:outpatient    DIAGNOSIS: Breast cancer.  CONSENT VERIFIED: yes   SET UP: Patient is setup supine   IMMOBILIZATION:  The following immobilization was used:Custom Moldable Pillow, breast board.   NARRATIVE: [name] was brought to the [facility].  Identity was confirmed.  All relevant records and images related to the planned course of therapy were reviewed.  Then, the patient was positioned in a stable reproducible clinical set-up for radiation therapy.  Wires were placed to delineate the clinical extent of breast tissue. A wire was placed on the scar as well.  CT images were obtained.  An isocenter was placed. Skin markings were placed.  The CT images were loaded into the planning software where the target and avoidance structures were contoured.  The radiation prescription was entered and confirmed. The patient was discharged in stable condition and tolerated simulation well.    TREATMENT PLANNING NOTE:  Treatment planning then occurred. I have requested : MLC's, isodose plan, basic dose calculation  I personally designed and supervised the construction of 5 medically necessary complex treatment devices for the protection of critical normal structures including the lungs and contralateral breast as well as the immobilization device which is necessary for set up certainty.   TREATMENT PLANNING NOTE/3D Simulation Note Treatment planning then occurred. I have requested : MLC's, isodose plan, basic dose calculation  3D simulation was performed.  I personally constructed 6 complex treatment devices in the form of MLCs which will be used for beam modification and to protect critical structures including the heart and lung.  I have requested a dose volume histogram of the heart lung and tumor cavity.

## 2015-01-22 ENCOUNTER — Ambulatory Visit: Payer: Medicare Other | Admitting: Radiation Oncology

## 2015-01-22 DIAGNOSIS — Z510 Encounter for antineoplastic radiation therapy: Secondary | ICD-10-CM | POA: Diagnosis not present

## 2015-01-23 ENCOUNTER — Ambulatory Visit
Admission: RE | Admit: 2015-01-23 | Discharge: 2015-01-23 | Disposition: A | Discharge status: Home / Self Care | Payer: Medicare Other | Source: Ambulatory Visit | Attending: Radiation Oncology | Admitting: Radiation Oncology

## 2015-01-23 ENCOUNTER — Ambulatory Visit: Payer: Medicare Other

## 2015-01-23 ENCOUNTER — Ambulatory Visit: Payer: Medicare Other | Admitting: Radiation Oncology

## 2015-01-23 DIAGNOSIS — Z510 Encounter for antineoplastic radiation therapy: Secondary | ICD-10-CM | POA: Diagnosis not present

## 2015-01-25 ENCOUNTER — Other Ambulatory Visit: Payer: Self-pay | Admitting: Podiatry

## 2015-01-26 ENCOUNTER — Ambulatory Visit: Payer: Medicare Other

## 2015-01-26 ENCOUNTER — Ambulatory Visit
Admission: RE | Admit: 2015-01-26 | Discharge: 2015-01-26 | Disposition: A | Discharge status: Home / Self Care | Payer: Medicare Other | Source: Ambulatory Visit | Attending: Radiation Oncology | Admitting: Radiation Oncology

## 2015-01-26 DIAGNOSIS — Z510 Encounter for antineoplastic radiation therapy: Secondary | ICD-10-CM | POA: Diagnosis not present

## 2015-01-27 ENCOUNTER — Ambulatory Visit
Admission: RE | Admit: 2015-01-27 | Discharge: 2015-01-27 | Disposition: A | Discharge status: Home / Self Care | Payer: Medicare Other | Source: Ambulatory Visit | Attending: Radiation Oncology | Admitting: Radiation Oncology

## 2015-01-27 ENCOUNTER — Encounter: Payer: Self-pay | Admitting: General Practice

## 2015-01-27 DIAGNOSIS — Z510 Encounter for antineoplastic radiation therapy: Secondary | ICD-10-CM | POA: Diagnosis not present

## 2015-01-27 NOTE — Progress Notes (Signed)
Spiritual Care Note  Angela Bond sought me out today to share new coloring cards (small, to be given to others, which she finds very meaningful).  Per pt, she is "doing better because I have decided not to be angry anymore"; she is using prayer, devotional reading, and meditative coloring as tools for meaning-making, centering, refocusing, and deepening her spiritual life in the midst of life's many challenges (her illness and her husband's).  Angela Bond was also pleased and relieved to report that her first experience with radiation went well.  She plans to come to appointments early in order to utilize support as needed; anticipating this routine gives her a sense of comfort and agency.  She is aware of ongoing chaplain availability for support, but please also page as needs arise.  Thank you.  [place], [place] Pager [phone] Voicemail  [phone]

## 2015-01-28 ENCOUNTER — Ambulatory Visit
Admission: RE | Admit: 2015-01-28 | Discharge: 2015-01-28 | Disposition: A | Discharge status: Home / Self Care | Payer: Medicare Other | Source: Ambulatory Visit | Attending: Radiation Oncology | Admitting: Radiation Oncology

## 2015-01-28 ENCOUNTER — Telehealth: Payer: Self-pay

## 2015-01-28 DIAGNOSIS — Z510 Encounter for antineoplastic radiation therapy: Secondary | ICD-10-CM | POA: Diagnosis not present

## 2015-01-28 NOTE — Telephone Encounter (Signed)
Lab results dtd 01/28/15 rcvd from [place].  Reviewed by Dr. Lindi Adie.  Sent to scan.

## 2015-01-29 ENCOUNTER — Ambulatory Visit
Admission: RE | Admit: 2015-01-29 | Discharge: 2015-01-29 | Disposition: A | Discharge status: Home / Self Care | Payer: Medicare Other | Source: Ambulatory Visit | Attending: Radiation Oncology | Admitting: Radiation Oncology

## 2015-01-29 VITALS — BP 160/92 | HR 71 | Temp 98.7°F | Wt 160.1 lb

## 2015-01-29 DIAGNOSIS — Z510 Encounter for antineoplastic radiation therapy: Secondary | ICD-10-CM | POA: Diagnosis not present

## 2015-01-29 DIAGNOSIS — C50411 Malignant neoplasm of upper-outer quadrant of right female breast: Secondary | ICD-10-CM

## 2015-01-29 MED ORDER — LORAZEPAM 1 MG PO TABS: ORAL_TABLET | ORAL | Status: DC

## 2015-01-29 MED ORDER — ALRA NON-METALLIC DEODORANT (RAD-ONC)
1.0000 "application " | Freq: Once | TOPICAL | Status: AC
  Administered 2015-01-29: 1 via TOPICAL

## 2015-01-29 MED ORDER — RADIAPLEXRX EX GEL
Freq: Once | CUTANEOUS | Status: AC
  Administered 2015-01-29: 12:00:00 via TOPICAL

## 2015-01-29 NOTE — Progress Notes (Addendum)
Weekly Management Note Current Dose: 7.2  Gy  Projected Dose: 61 Gy   Narrative:  The patient presents for routine under treatment assessment.  CBCT/MVCT images/Port film x-rays were reviewed.  The chart was checked. [place] sleeping and anxious over treatment. Husband is "cancer free" and doesn't need RT or chemo.   Physical Findings: Weight: 160 lb 1.6 oz (72.621 kg). Unchanged  Impression:  The patient is tolerating radiation.  Plan:  Continue treatment as planned. Up Ativan to 1mg  qhs and prior to RT. Encouraged her to look into [facility] [place].

## 2015-01-29 NOTE — Progress Notes (Signed)
Weekly Management Note Current Dose: 27  Gy  Projected Dose: 61 Gy   Narrative:  The patient presents for routine under treatment assessment.  CBCT/MVCT images/Port film x-rays were reviewed.  The chart was checked. BP still elevated. Appt with pulm. HTN clinic next Tuesday. C/O fatigue.   Physical Findings: Weight: 160 lb 1.6 oz (72.621 kg). Unchanged  Impression:  The patient is tolerating radiation.  Plan:  Continue treatment as planned. Rest. Attend pulm appt.

## 2015-01-29 NOTE — Addendum Note (Signed)
Encounter addended by: Norm Salt, RN on: 01/29/2015  5:58 PM<BR>     Documentation filed: Inpatient Patient Education, Notes Section, Chief Complaint Section

## 2015-01-29 NOTE — Progress Notes (Signed)
Pt here for patient teaching.  Pt given Radiation and You booklet, skin care instructions, Alra deodorant and Radiaplex gel. Pt reports they have not watched the Radiation Therapy Education video on    Reviewed areas of pertinence such as  . Pt able to give teach back of to pat skin, use unscented/gentle soap and drink plenty of water,apply Radiaplex bid, avoid applying anything to skin within 4 hours of treatment, avoid wearing an under wire bra and to use an electric razor if they must shave. Pt demonstrated understanding, no evidence of learning and refused teaching of information given and will contact nursing with any questions or concern.  Reviewed routine of clinic and doctor day.

## 2015-01-29 NOTE — Addendum Note (Signed)
Encounter addended by: Thea Silversmith, MD on: 01/29/2015  3:24 PM<BR>     Documentation filed: Notes Section

## 2015-01-30 ENCOUNTER — Ambulatory Visit
Admission: RE | Admit: 2015-01-30 | Discharge: 2015-01-30 | Disposition: A | Discharge status: Home / Self Care | Payer: Medicare Other | Source: Ambulatory Visit | Attending: Radiation Oncology | Admitting: Radiation Oncology

## 2015-01-30 DIAGNOSIS — Z510 Encounter for antineoplastic radiation therapy: Secondary | ICD-10-CM | POA: Diagnosis not present

## 2015-02-02 ENCOUNTER — Ambulatory Visit
Admission: RE | Admit: 2015-02-02 | Discharge: 2015-02-02 | Disposition: A | Discharge status: Home / Self Care | Payer: Medicare Other | Source: Ambulatory Visit | Attending: Radiation Oncology | Admitting: Radiation Oncology

## 2015-02-02 DIAGNOSIS — Z510 Encounter for antineoplastic radiation therapy: Secondary | ICD-10-CM | POA: Diagnosis not present

## 2015-02-03 ENCOUNTER — Ambulatory Visit
Admission: RE | Admit: 2015-02-03 | Discharge: 2015-02-03 | Disposition: A | Discharge status: Home / Self Care | Payer: Medicare Other | Source: Ambulatory Visit | Attending: Radiation Oncology | Admitting: Radiation Oncology

## 2015-02-03 ENCOUNTER — Encounter: Payer: Self-pay | Admitting: Radiation Oncology

## 2015-02-03 VITALS — BP 151/94 | HR 78 | Resp 16 | Wt 159.5 lb

## 2015-02-03 DIAGNOSIS — C50411 Malignant neoplasm of upper-outer quadrant of right female breast: Secondary | ICD-10-CM

## 2015-02-03 DIAGNOSIS — Z510 Encounter for antineoplastic radiation therapy: Secondary | ICD-10-CM | POA: Diagnosis not present

## 2015-02-03 NOTE — Progress Notes (Signed)
Weight and vitals stable. Reports neck pain related to effects of laying in treatment position. No lymphedema of right upper extremity noted. Faint hyperpigmentation of right breast noted without desquamation. Reports using radiaplex bid. Reports fatigue. Husband states, "she sleeps a lot during the day when she didn't use to do that."   BP 151/94 mmHg  Pulse 78  Resp 16  Wt 159 lb 8 oz (72.349 kg)  SpO2 100% Wt Readings from Last 3 Encounters:  02/03/15 159 lb 8 oz (72.349 kg)  01/29/15 160 lb 1.6 oz (72.621 kg)  01/07/15 161 lb 9.6 oz (73.301 kg)

## 2015-02-03 NOTE — Progress Notes (Signed)
Weekly Management Note Current Dose: 7.2  Gy  Projected Dose: 61 Gy   Narrative:  The patient presents for routine under treatment assessment.  CBCT/MVCT images/Port film x-rays were reviewed.  The chart was checked. [place] sleeping and anxious over treatment. Husband is "cancer free" and doesn't need RT or chemo.   Physical Findings: Weight: 159 lb 8 oz (72.349 kg). Unchanged  Impression:  The patient is tolerating radiation.  Plan:  Continue treatment as planned. Up Ativan to 1mg  qhs and prior to RT. Encouraged her to look into [facility].

## 2015-02-03 NOTE — Progress Notes (Signed)
Weekly Management Note Current Dose: 12.6  Gy  Projected Dose: 61 Gy   Narrative:  The patient presents for routine under treatment assessment.  CBCT/MVCT images/Port film x-rays were reviewed.  The chart was checked. Doing well. Smiling. Using radiaplex.   Physical Findings: Weight: 159 lb 8 oz (72.349 kg). Unchanged  Impression:  The patient is tolerating radiation.  Plan:  Continue treatment as planned.

## 2015-02-04 ENCOUNTER — Ambulatory Visit
Admission: RE | Admit: 2015-02-04 | Discharge: 2015-02-04 | Disposition: A | Discharge status: Home / Self Care | Payer: Medicare Other | Source: Ambulatory Visit | Attending: Radiation Oncology | Admitting: Radiation Oncology

## 2015-02-04 DIAGNOSIS — Z510 Encounter for antineoplastic radiation therapy: Secondary | ICD-10-CM | POA: Diagnosis not present

## 2015-02-05 ENCOUNTER — Ambulatory Visit
Admission: RE | Admit: 2015-02-05 | Discharge: 2015-02-05 | Disposition: A | Discharge status: Home / Self Care | Payer: Medicare Other | Source: Ambulatory Visit | Attending: Radiation Oncology | Admitting: Radiation Oncology

## 2015-02-05 ENCOUNTER — Telehealth: Payer: Self-pay | Admitting: *Deleted

## 2015-02-05 ENCOUNTER — Encounter: Payer: Self-pay | Admitting: Radiation Oncology

## 2015-02-05 VITALS — BP 135/92 | HR 86 | Temp 98.9°F | Resp 16

## 2015-02-05 DIAGNOSIS — Z510 Encounter for antineoplastic radiation therapy: Secondary | ICD-10-CM | POA: Diagnosis not present

## 2015-02-05 DIAGNOSIS — C50411 Malignant neoplasm of upper-outer quadrant of right female breast: Secondary | ICD-10-CM

## 2015-02-05 MED ORDER — OXYCODONE-ACETAMINOPHEN 5-325 MG PO TABS: 1.0000 | ORAL_TABLET | ORAL | Status: DC | PRN

## 2015-02-05 MED ORDER — CYCLOBENZAPRINE HCL 5 MG PO TABS: 5.0000 mg | ORAL_TABLET | Freq: Three times a day (TID) | ORAL | Status: DC | PRN

## 2015-02-05 NOTE — Progress Notes (Signed)
Patient presented to the clinic requesting to be seen. Patient reports left sided neck pain unrelieved with Bengay and Tylenol. Patient unable to rate pain in neck. Patient reports her neck is stiff and she is unable to turn her head. She reports feeling a lot of pressure that radiates from the left side of her neck into her head. Patient reports taking OTC tylenol in addition to bengay without relief. Facial grimacing noted. Reports she was instructed to call back on Monday to arrange an appointment for massage therapy.  BP 135/92 mmHg  Pulse 86  Temp(Src) 98.9 F (37.2 C) (Oral)  Resp 16  SpO2 100% Wt Readings from Last 3 Encounters:  02/03/15 159 lb 8 oz (72.349 kg)  01/29/15 160 lb 1.6 oz (72.621 kg)  01/07/15 161 lb 9.6 oz (73.301 kg)

## 2015-02-05 NOTE — Telephone Encounter (Signed)
CALLED PATIENT TO INFORM OF MRI ON 02-13-15 @ 5 PM @ [facility], LVM FOR A RETURN CALL

## 2015-02-05 NOTE — Progress Notes (Signed)
Weekly Management Note Current Dose: 16.2  Gy  Projected Dose: 61 Gy   Narrative:  The patient presents for routine under treatment assessment.  CBCT/MVCT images/Port film x-rays were reviewed.  The chart was checked. Patient reports left sided neck pain unrelieved with Bengay and Tylenol. Patient unable to rate pain in neck. Patient reports her neck is stiff and she is unable to turn her head. She reports feeling a lot of pressure that radiates from the left side of her neck into her head. The patient reports she was instructed to call back on Monday to arrange an appointment for massage therapy. She continues to have problems with balance.  She has taken "pain medications" and "muscle relaxers for this before with good results.   Physical Findings: Weight:  . Unchanged. 5/5 strength in left upper extremity.   Impression:  The patient is tolerating radiation.  Plan:  Continue treatment as planned.MRI of the cervical spine. Prescription given for percocet and flexeril.   This document serves as a record of services personally performed by Thea Silversmith, MD. It was created on her behalf by Darcus Austin, a trained medical scribe. The creation of this record is based on the scribe's personal observations and the provider's statements to them. This document has been checked and approved by the attending provider.

## 2015-02-06 ENCOUNTER — Ambulatory Visit
Admission: RE | Admit: 2015-02-06 | Discharge: 2015-02-06 | Disposition: A | Discharge status: Home / Self Care | Payer: Medicare Other | Source: Ambulatory Visit | Attending: Radiation Oncology | Admitting: Radiation Oncology

## 2015-02-06 DIAGNOSIS — Z510 Encounter for antineoplastic radiation therapy: Secondary | ICD-10-CM | POA: Diagnosis not present

## 2015-02-09 ENCOUNTER — Ambulatory Visit
Admission: RE | Admit: 2015-02-09 | Discharge: 2015-02-09 | Disposition: A | Discharge status: Home / Self Care | Payer: Medicare Other | Source: Ambulatory Visit | Attending: Radiation Oncology | Admitting: Radiation Oncology

## 2015-02-09 DIAGNOSIS — Z510 Encounter for antineoplastic radiation therapy: Secondary | ICD-10-CM | POA: Diagnosis not present

## 2015-02-10 ENCOUNTER — Ambulatory Visit
Admission: RE | Admit: 2015-02-10 | Discharge: 2015-02-10 | Disposition: A | Discharge status: Home / Self Care | Payer: Medicare Other | Source: Ambulatory Visit | Attending: Radiation Oncology | Admitting: Radiation Oncology

## 2015-02-10 VITALS — BP 126/87 | HR 80 | Temp 98.6°F | Wt 158.2 lb

## 2015-02-10 DIAGNOSIS — C50411 Malignant neoplasm of upper-outer quadrant of right female breast: Secondary | ICD-10-CM

## 2015-02-10 DIAGNOSIS — Z510 Encounter for antineoplastic radiation therapy: Secondary | ICD-10-CM | POA: Diagnosis not present

## 2015-02-10 NOTE — Progress Notes (Signed)
Weekly Management Note Current Dose: 21.6  Gy  Projected Dose: 61 Gy   Narrative:  The patient presents for routine under treatment assessment.  CBCT/MVCT images/Port film x-rays were reviewed.  The chart was checked. "I hurt all over" due to arthritis. MRI scheduled for Friday. Massage did not help. "I hate this place."  Physical Findings: Weight: 158 lb 3.2 oz (71.759 kg). Unchanged. 5/5 strength in left upper extremity. No skin changes over right breast.   Impression:  The patient is tolerating radiation.  Plan:  Continue treatment as planned.MRI of the cervical spine. Continue supportive meds as needed. Continue radiaplex for breast skin care.

## 2015-02-10 NOTE — Progress Notes (Signed)
Weekly assessment of radiation to left breast.No skin changes.patient affect much improved states lorazepam has helped and she doesn't feel angry when coming through front door anymore.Patient will start radiaplex when skin changes develop. BP 126/87 mmHg  Pulse 80  Temp(Src) 98.6 F (37 C)  Wt 158 lb 3.2 oz (71.759 kg)

## 2015-02-11 ENCOUNTER — Ambulatory Visit
Admission: RE | Admit: 2015-02-11 | Discharge: 2015-02-11 | Disposition: A | Discharge status: Home / Self Care | Payer: Medicare Other | Source: Ambulatory Visit | Attending: Radiation Oncology | Admitting: Radiation Oncology

## 2015-02-11 DIAGNOSIS — Z510 Encounter for antineoplastic radiation therapy: Secondary | ICD-10-CM | POA: Diagnosis not present

## 2015-02-12 ENCOUNTER — Ambulatory Visit
Admission: RE | Admit: 2015-02-12 | Discharge: 2015-02-12 | Disposition: A | Discharge status: Home / Self Care | Payer: Medicare Other | Source: Ambulatory Visit | Attending: Radiation Oncology | Admitting: Radiation Oncology

## 2015-02-12 DIAGNOSIS — Z510 Encounter for antineoplastic radiation therapy: Secondary | ICD-10-CM | POA: Diagnosis not present

## 2015-02-13 ENCOUNTER — Ambulatory Visit
Admission: RE | Admit: 2015-02-13 | Discharge: 2015-02-13 | Disposition: A | Discharge status: Home / Self Care | Payer: Medicare Other | Source: Ambulatory Visit | Attending: Radiation Oncology | Admitting: Radiation Oncology

## 2015-02-13 ENCOUNTER — Other Ambulatory Visit: Payer: Self-pay | Admitting: Radiation Oncology

## 2015-02-13 ENCOUNTER — Ambulatory Visit (HOSPITAL_COMMUNITY)
Admission: RE | Admit: 2015-02-13 | Discharge: 2015-02-13 | Disposition: A | Discharge status: Home / Self Care | Payer: Medicare Other | Source: Ambulatory Visit | Attending: Radiation Oncology | Admitting: Radiation Oncology

## 2015-02-13 DIAGNOSIS — M25512 Pain in left shoulder: Secondary | ICD-10-CM | POA: Diagnosis not present

## 2015-02-13 DIAGNOSIS — R937 Abnormal findings on diagnostic imaging of other parts of musculoskeletal system: Secondary | ICD-10-CM | POA: Insufficient documentation

## 2015-02-13 DIAGNOSIS — C50411 Malignant neoplasm of upper-outer quadrant of right female breast: Secondary | ICD-10-CM

## 2015-02-13 DIAGNOSIS — Z510 Encounter for antineoplastic radiation therapy: Secondary | ICD-10-CM | POA: Diagnosis not present

## 2015-02-13 DIAGNOSIS — M542 Cervicalgia: Secondary | ICD-10-CM | POA: Insufficient documentation

## 2015-02-13 MED ORDER — GADOBENATE DIMEGLUMINE 529 MG/ML IV SOLN
15.0000 mL | Freq: Once | INTRAVENOUS | Status: AC | PRN
  Administered 2015-02-13: 15 mL via INTRAVENOUS

## 2015-02-15 ENCOUNTER — Ambulatory Visit: Payer: Medicare Other

## 2015-02-16 ENCOUNTER — Ambulatory Visit
Admission: RE | Admit: 2015-02-16 | Discharge: 2015-02-16 | Disposition: A | Discharge status: Home / Self Care | Payer: Medicare Other | Source: Ambulatory Visit | Attending: Radiation Oncology | Admitting: Radiation Oncology

## 2015-02-16 DIAGNOSIS — Z510 Encounter for antineoplastic radiation therapy: Secondary | ICD-10-CM | POA: Diagnosis not present

## 2015-02-17 ENCOUNTER — Telehealth: Payer: Self-pay | Admitting: *Deleted

## 2015-02-17 ENCOUNTER — Encounter: Payer: Self-pay | Admitting: Radiation Oncology

## 2015-02-17 ENCOUNTER — Ambulatory Visit
Admission: RE | Admit: 2015-02-17 | Discharge: 2015-02-17 | Disposition: A | Discharge status: Home / Self Care | Payer: Medicare Other | Source: Ambulatory Visit | Attending: Radiation Oncology | Admitting: Radiation Oncology

## 2015-02-17 VITALS — BP 141/95 | HR 80 | Temp 98.1°F | Resp 20 | Wt 155.9 lb

## 2015-02-17 DIAGNOSIS — C50411 Malignant neoplasm of upper-outer quadrant of right female breast: Secondary | ICD-10-CM

## 2015-02-17 DIAGNOSIS — Z510 Encounter for antineoplastic radiation therapy: Secondary | ICD-10-CM | POA: Insufficient documentation

## 2015-02-17 MED ORDER — MELOXICAM 15 MG PO TABS: 15.0000 mg | ORAL_TABLET | Freq: Every day | ORAL | Status: DC

## 2015-02-17 MED ORDER — RADIAPLEXRX EX GEL
Freq: Once | CUTANEOUS | Status: AC
  Administered 2015-02-17: 09:00:00 via TOPICAL

## 2015-02-17 NOTE — Progress Notes (Signed)
Weekly Management Note Current Dose: 30.6  Gy  Projected Dose: 61 Gy   Narrative:  The patient presents for routine under treatment assessment.  CBCT/MVCT images/Port film x-rays were reviewed.  The chart was checked. Using radiaplex daily and was given another tube. C/o sharp pain inside the nipple at times and neck and left shoulder pain. She ran out of meloxicam and is taking tylenol arthritis pain meds prn. She reports a fair appetite, and fatigue. She goes to bed at [facility] and is up at 7:30AM. MRI of the cervical spine on 02/13/15 showed multiple degenerative changes and C5-C6 has mild osseous edema/enhancement.  Physical Findings: Weight: 155 lb 14.4 oz (70.716 kg). Minimal pink skin over the right breast and supraclavicular area.  Impression:  The patient is tolerating radiation.  Plan:  Continue treatment as planned. I refilled her meloxicam. I will refer her to neurosurgery for her MRI finding. Continue radiaplex for breast skin care.  This document serves as a record of services personally performed by Thea Silversmith, MD. It was created on her behalf by Darcus Austin, a trained medical scribe. The creation of this record is based on the scribe's personal observations and the provider's statements to them. This document has been checked and approved by the attending provider.

## 2015-02-17 NOTE — Telephone Encounter (Signed)
CALLED PATIENT TO INFORM OF APPT. WITH DR. RANDY KRITZER ON 03-04-15- ARRIVAL TIME - 1:15 PM, SPOKE WITH PATIENT AND SHE IS AWARE OF THIS APPT.

## 2015-02-17 NOTE — Progress Notes (Addendum)
Weekly rd txs right brast, mild erythema on subclavian, and back,   And axilla, using rdaiplex daily, gave another tube, c/o sharp pain inside nipple at times, c/o neck and left shoulder pain, ran out of melaxicam,  Taking tylenol arthritis pain meds prn,  Appetite so so stated, energy level gets tired easily, goes to bed at 8pm, up at 730 am 9:19 AM Wt Readings from Last 3 Encounters:  02/10/15 158 lb 3.2 oz (71.759 kg)  02/03/15 159 lb 8 oz (72.349 kg)  01/29/15 160 lb 1.6 oz (72.621 kg)  BP 141/95 mmHg  Pulse 80  Temp(Src) 98.1 F (36.7 C) (Oral)  Resp 20  Wt 155 lb 14.4 oz (70.716 kg)

## 2015-02-18 ENCOUNTER — Encounter: Payer: Self-pay | Admitting: Radiation Oncology

## 2015-02-18 ENCOUNTER — Ambulatory Visit
Admission: RE | Admit: 2015-02-18 | Discharge: 2015-02-18 | Disposition: A | Discharge status: Home / Self Care | Payer: Medicare Other | Source: Ambulatory Visit | Attending: Radiation Oncology | Admitting: Radiation Oncology

## 2015-02-18 DIAGNOSIS — Z510 Encounter for antineoplastic radiation therapy: Secondary | ICD-10-CM | POA: Diagnosis not present

## 2015-02-19 ENCOUNTER — Ambulatory Visit
Admission: RE | Admit: 2015-02-19 | Discharge: 2015-02-19 | Disposition: A | Discharge status: Home / Self Care | Payer: Medicare Other | Source: Ambulatory Visit | Attending: Radiation Oncology | Admitting: Radiation Oncology

## 2015-02-19 DIAGNOSIS — Z510 Encounter for antineoplastic radiation therapy: Secondary | ICD-10-CM | POA: Diagnosis not present

## 2015-02-20 ENCOUNTER — Encounter: Payer: Self-pay | Admitting: *Deleted

## 2015-02-20 ENCOUNTER — Ambulatory Visit
Admission: RE | Admit: 2015-02-20 | Discharge: 2015-02-20 | Disposition: A | Discharge status: Home / Self Care | Payer: Medicare Other | Source: Ambulatory Visit | Attending: Radiation Oncology | Admitting: Radiation Oncology

## 2015-02-20 DIAGNOSIS — Z510 Encounter for antineoplastic radiation therapy: Secondary | ICD-10-CM | POA: Diagnosis not present

## 2015-02-23 ENCOUNTER — Ambulatory Visit
Admission: RE | Admit: 2015-02-23 | Discharge: 2015-02-23 | Disposition: A | Discharge status: Home / Self Care | Payer: Medicare Other | Source: Ambulatory Visit | Attending: Radiation Oncology | Admitting: Radiation Oncology

## 2015-02-23 DIAGNOSIS — Z510 Encounter for antineoplastic radiation therapy: Secondary | ICD-10-CM | POA: Diagnosis not present

## 2015-02-24 ENCOUNTER — Ambulatory Visit
Admission: RE | Admit: 2015-02-24 | Discharge: 2015-02-24 | Disposition: A | Discharge status: Home / Self Care | Payer: Medicare Other | Source: Ambulatory Visit | Attending: Radiation Oncology | Admitting: Radiation Oncology

## 2015-02-24 ENCOUNTER — Encounter: Payer: Self-pay | Admitting: Radiation Oncology

## 2015-02-24 VITALS — BP 162/96 | HR 67 | Temp 98.1°F | Resp 20 | Wt 161.7 lb

## 2015-02-24 DIAGNOSIS — C50411 Malignant neoplasm of upper-outer quadrant of right female breast: Secondary | ICD-10-CM

## 2015-02-24 DIAGNOSIS — Z510 Encounter for antineoplastic radiation therapy: Secondary | ICD-10-CM | POA: Diagnosis not present

## 2015-02-24 MED ORDER — BIAFINE EX EMUL
CUTANEOUS | Status: DC | PRN
  Administered 2015-02-24: 10:00:00 via TOPICAL

## 2015-02-24 NOTE — Progress Notes (Signed)
Weekly Management Note Current Dose: 39.6  Gy  Projected Dose: 61 Gy   Narrative:  The patient presents for routine under treatment assessment.  CBCT/MVCT images/Port film x-rays were reviewed.  The chart was checked. Saw on tx machine for electron set up. Dizzy with a fall going to bathroom yesterday. Appt with neurosurgery next week. No headache. Arm/neck pain continues.   Physical Findings: Weight: 161 lb 11.2 oz (73.347 kg). Minimal pink skin over the right breast and supraclavicular area.  Impression:  The patient is tolerating radiation.  Plan:  Continue treatment as planned. Neurosurgery next week. Check orthostatic vitals. Encourage pos.   This document serves as a record of services personally performed by Thea Silversmith, MD. It was created on her behalf by Darcus Austin, a trained medical scribe. The creation of this record is based on the scribe's personal observations and the provider's statements to them. This document has been checked and approved by the attending provider.

## 2015-02-24 NOTE — Progress Notes (Addendum)
Patient seen in the back by MD c/o dizzy ness, took ortho static vitals: BP 158/69 mmHg  Pulse 58  Temp(Src) 98.1 F (36.7 C) (Oral)  Resp 20  SpO2 100%  Sitting BP 162/96 mmHg  Pulse 67  Temp(Src) 98.1 F (36.7 C) (Oral)  Resp 20  SpO2 100% Standing Wt Readings from Last 3 Encounters:  02/17/15 155 lb 14.4 oz (70.716 kg)  02/10/15 158 lb 3.2 oz (71.759 kg)  02/03/15 159 lb 8 oz (72.349 kg)  c/o burning and aching on breast asked for different lotion, informed MD of vitals 9:48 AM

## 2015-02-24 NOTE — Progress Notes (Signed)
Patient seen in the back by MD c/o dizzy ness, took ortho static vitals: There were no vitals taken for this visit.  Sitting There were no vitals taken for this visit. Standing Wt Readings from Last 3 Encounters:  02/24/15 161 lb 11.2 oz (73.347 kg)  02/17/15 155 lb 14.4 oz (70.716 kg)  02/10/15 158 lb 3.2 oz (71.759 kg)  c/o burning and aching on breast asked for different lotion, informed MD of vitals, biafine given to use bid, encouraged fluids/water increase,  pateint with fall risk yellow bracelet on wrist and uses a cane, 9:59 AM

## 2015-02-25 ENCOUNTER — Ambulatory Visit
Admission: RE | Admit: 2015-02-25 | Discharge: 2015-02-25 | Disposition: A | Discharge status: Home / Self Care | Payer: Medicare Other | Source: Ambulatory Visit | Attending: Radiation Oncology | Admitting: Radiation Oncology

## 2015-02-25 DIAGNOSIS — Z510 Encounter for antineoplastic radiation therapy: Secondary | ICD-10-CM | POA: Diagnosis not present

## 2015-02-26 ENCOUNTER — Ambulatory Visit
Admission: RE | Admit: 2015-02-26 | Discharge: 2015-02-26 | Disposition: A | Discharge status: Home / Self Care | Payer: Medicare Other | Source: Ambulatory Visit | Attending: Radiation Oncology | Admitting: Radiation Oncology

## 2015-02-26 DIAGNOSIS — Z510 Encounter for antineoplastic radiation therapy: Secondary | ICD-10-CM | POA: Diagnosis not present

## 2015-02-27 ENCOUNTER — Ambulatory Visit: Payer: Medicare Other

## 2015-02-27 ENCOUNTER — Ambulatory Visit
Admission: RE | Admit: 2015-02-27 | Discharge: 2015-02-27 | Disposition: A | Discharge status: Home / Self Care | Payer: Medicare Other | Source: Ambulatory Visit | Attending: Radiation Oncology | Admitting: Radiation Oncology

## 2015-02-27 DIAGNOSIS — Z510 Encounter for antineoplastic radiation therapy: Secondary | ICD-10-CM | POA: Diagnosis not present

## 2015-03-02 ENCOUNTER — Ambulatory Visit: Payer: Medicare Other

## 2015-03-02 ENCOUNTER — Ambulatory Visit
Admission: RE | Admit: 2015-03-02 | Discharge: 2015-03-02 | Disposition: A | Discharge status: Home / Self Care | Payer: Medicare Other | Source: Ambulatory Visit | Attending: Radiation Oncology | Admitting: Radiation Oncology

## 2015-03-02 DIAGNOSIS — Z510 Encounter for antineoplastic radiation therapy: Secondary | ICD-10-CM | POA: Diagnosis not present

## 2015-03-03 ENCOUNTER — Ambulatory Visit
Admission: RE | Admit: 2015-03-03 | Discharge: 2015-03-03 | Disposition: A | Discharge status: Home / Self Care | Payer: Medicare Other | Source: Ambulatory Visit | Attending: Radiation Oncology | Admitting: Radiation Oncology

## 2015-03-03 VITALS — BP 155/95 | HR 68 | Temp 98.7°F | Wt 162.0 lb

## 2015-03-03 DIAGNOSIS — C50411 Malignant neoplasm of upper-outer quadrant of right female breast: Secondary | ICD-10-CM | POA: Insufficient documentation

## 2015-03-03 DIAGNOSIS — Z510 Encounter for antineoplastic radiation therapy: Secondary | ICD-10-CM | POA: Diagnosis not present

## 2015-03-03 MED ORDER — BIAFINE EX EMUL
CUTANEOUS | Status: DC | PRN
  Administered 2015-03-03: 11:00:00 via TOPICAL

## 2015-03-03 NOTE — Progress Notes (Signed)
BP 155/95 mmHg  Pulse 68  Temp(Src) 98.7 F (37.1 C)  Wt 162 lb (73.483 kg)  Weekly assessment of radiation to right breast.Increased redness anterior and  posterior scapula and mammary fold without peeling.Completed 27 of 33treatments.Continue application of biafine.Moderate fatigue.

## 2015-03-03 NOTE — Progress Notes (Signed)
Weekly Management Note Current Dose: 39.6  Gy  Projected Dose: 61 Gy   Narrative:  The patient presents for routine under treatment assessment.  CBCT/MVCT images/Port film x-rays were reviewed.  The chart was checked. Saw on tx machine for electron set up. Dizzy with a fall going to bathroom yesterday. Appt with neurosurgery tomorrow No headache. Arm/neck pain continues, especially worse when she moves her head to the side.   Physical Findings: Weight: 162 lb (73.483 kg). Pink skin over the right breast and supraclavicular area.  Impression:  The patient is tolerating radiation.  Plan:  Continue treatment as planned. Neurosurgery tomorrow. Wrote letter to [employer]. BP ok.   This document serves as a record of services personally performed by Thea Silversmith, MD. It was created on her behalf by Darcus Austin, a trained medical scribe. The creation of this record is based on the scribe's personal observations and the provider's statements to them. This document has been checked and approved by the attending provider.

## 2015-03-04 ENCOUNTER — Ambulatory Visit
Admission: RE | Admit: 2015-03-04 | Discharge: 2015-03-04 | Disposition: A | Discharge status: Home / Self Care | Payer: Medicare Other | Source: Ambulatory Visit | Attending: Radiation Oncology | Admitting: Radiation Oncology

## 2015-03-04 DIAGNOSIS — Z510 Encounter for antineoplastic radiation therapy: Secondary | ICD-10-CM | POA: Diagnosis not present

## 2015-03-05 ENCOUNTER — Ambulatory Visit
Admission: RE | Admit: 2015-03-05 | Discharge: 2015-03-05 | Disposition: A | Discharge status: Home / Self Care | Payer: Medicare Other | Source: Ambulatory Visit | Attending: Radiation Oncology | Admitting: Radiation Oncology

## 2015-03-05 DIAGNOSIS — Z510 Encounter for antineoplastic radiation therapy: Secondary | ICD-10-CM | POA: Diagnosis not present

## 2015-03-06 ENCOUNTER — Ambulatory Visit
Admission: RE | Admit: 2015-03-06 | Discharge: 2015-03-06 | Disposition: A | Discharge status: Home / Self Care | Payer: Medicare Other | Source: Ambulatory Visit | Attending: Radiation Oncology | Admitting: Radiation Oncology

## 2015-03-06 DIAGNOSIS — Z510 Encounter for antineoplastic radiation therapy: Secondary | ICD-10-CM | POA: Diagnosis not present

## 2015-03-09 ENCOUNTER — Ambulatory Visit
Admission: RE | Admit: 2015-03-09 | Discharge: 2015-03-09 | Disposition: A | Discharge status: Home / Self Care | Payer: Medicare Other | Source: Ambulatory Visit | Attending: Radiation Oncology | Admitting: Radiation Oncology

## 2015-03-09 DIAGNOSIS — Z510 Encounter for antineoplastic radiation therapy: Secondary | ICD-10-CM | POA: Diagnosis not present

## 2015-03-10 ENCOUNTER — Ambulatory Visit: Payer: Medicare Other

## 2015-03-10 ENCOUNTER — Ambulatory Visit
Admission: RE | Admit: 2015-03-10 | Discharge: 2015-03-10 | Disposition: A | Discharge status: Home / Self Care | Payer: Medicare Other | Source: Ambulatory Visit | Attending: Radiation Oncology | Admitting: Radiation Oncology

## 2015-03-10 ENCOUNTER — Encounter: Payer: Self-pay | Admitting: Radiation Oncology

## 2015-03-10 VITALS — BP 144/86 | HR 70 | Temp 98.4°F | Resp 16 | Wt 158.3 lb

## 2015-03-10 DIAGNOSIS — C50411 Malignant neoplasm of upper-outer quadrant of right female breast: Secondary | ICD-10-CM

## 2015-03-10 DIAGNOSIS — Z510 Encounter for antineoplastic radiation therapy: Secondary | ICD-10-CM | POA: Diagnosis not present

## 2015-03-10 NOTE — Progress Notes (Signed)
Weekly rad txs right breast 32/33 completed, mild erythema  Under inframmary fold skin started peeling,  Using radiaplex gel bid,  Gave 1 month appt card to schedule,  Gave FYYN  Paper,  She is waiting to hear back from [facility], appetite good, gets tired easily, occasional stinging in right breast, interested in [facility] will e-mail  Hassan Rowan for next session 9:33 AM BP 144/86 mmHg  Pulse 70  Temp(Src) 98.4 F (36.9 C) (Oral)  Resp 16  Wt 158 lb 4.8 oz (71.804 kg)  Wt Readings from Last 3 Encounters:  03/10/15 158 lb 4.8 oz (71.804 kg)  03/03/15 162 lb (73.483 kg)  02/24/15 161 lb 11.2 oz (73.347 kg)

## 2015-03-10 NOTE — Progress Notes (Signed)
Weekly Management Note Current Dose:  59 Gy  Projected Dose: 61 Gy   Narrative:  The patient presents for routine under treatment assessment.  CBCT/MVCT images/Port film x-rays were reviewed.  The chart was checked. Doing well. No pain. Neurosurgery did not feel intervention was needed for her back pain. She is still having shoulder and arm pain on the left. 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   Physical Findings: Weight: 158 lb 4.8 oz (71.804 kg). Tanning of skin with some early peeling. No moist desquamation.   Impression:  The patient is tolerating radiation.  Plan:  Continue treatment as planned. Follow up in 1 month. We discussed ABC class due to copay for PT. Refer to [facility]

## 2015-03-11 ENCOUNTER — Encounter: Payer: Self-pay | Admitting: Radiation Oncology

## 2015-03-11 ENCOUNTER — Ambulatory Visit
Admission: RE | Admit: 2015-03-11 | Discharge: 2015-03-11 | Disposition: A | Discharge status: Home / Self Care | Payer: Medicare Other | Source: Ambulatory Visit | Attending: Radiation Oncology | Admitting: Radiation Oncology

## 2015-03-11 DIAGNOSIS — Z510 Encounter for antineoplastic radiation therapy: Secondary | ICD-10-CM | POA: Diagnosis not present

## 2015-03-13 ENCOUNTER — Other Ambulatory Visit: Payer: Self-pay | Admitting: Adult Health

## 2015-03-13 DIAGNOSIS — C50411 Malignant neoplasm of upper-outer quadrant of right female breast: Secondary | ICD-10-CM

## 2015-03-16 ENCOUNTER — Emergency Department (HOSPITAL_COMMUNITY)
Admission: EM | Admit: 2015-03-16 | Discharge: 2015-03-16 | Disposition: A | Discharge status: Home / Self Care | Payer: Medicare Other | Attending: Emergency Medicine | Admitting: Emergency Medicine

## 2015-03-16 ENCOUNTER — Encounter (HOSPITAL_COMMUNITY): Payer: Self-pay | Admitting: Emergency Medicine

## 2015-03-16 DIAGNOSIS — Z853 Personal history of malignant neoplasm of breast: Secondary | ICD-10-CM | POA: Insufficient documentation

## 2015-03-16 DIAGNOSIS — F419 Anxiety disorder, unspecified: Secondary | ICD-10-CM | POA: Diagnosis not present

## 2015-03-16 DIAGNOSIS — R197 Diarrhea, unspecified: Secondary | ICD-10-CM

## 2015-03-16 DIAGNOSIS — R112 Nausea with vomiting, unspecified: Secondary | ICD-10-CM | POA: Diagnosis not present

## 2015-03-16 DIAGNOSIS — F329 Major depressive disorder, single episode, unspecified: Secondary | ICD-10-CM | POA: Diagnosis not present

## 2015-03-16 DIAGNOSIS — R011 Cardiac murmur, unspecified: Secondary | ICD-10-CM | POA: Diagnosis not present

## 2015-03-16 DIAGNOSIS — D649 Anemia, unspecified: Secondary | ICD-10-CM | POA: Diagnosis not present

## 2015-03-16 DIAGNOSIS — Z79899 Other long term (current) drug therapy: Secondary | ICD-10-CM | POA: Insufficient documentation

## 2015-03-16 DIAGNOSIS — Z8701 Personal history of pneumonia (recurrent): Secondary | ICD-10-CM | POA: Insufficient documentation

## 2015-03-16 DIAGNOSIS — E7800 Pure hypercholesterolemia, unspecified: Secondary | ICD-10-CM | POA: Diagnosis not present

## 2015-03-16 DIAGNOSIS — Z8619 Personal history of other infectious and parasitic diseases: Secondary | ICD-10-CM | POA: Diagnosis not present

## 2015-03-16 DIAGNOSIS — M1990 Unspecified osteoarthritis, unspecified site: Secondary | ICD-10-CM | POA: Insufficient documentation

## 2015-03-16 DIAGNOSIS — R509 Fever, unspecified: Secondary | ICD-10-CM

## 2015-03-16 DIAGNOSIS — K219 Gastro-esophageal reflux disease without esophagitis: Secondary | ICD-10-CM | POA: Diagnosis not present

## 2015-03-16 DIAGNOSIS — I10 Essential (primary) hypertension: Secondary | ICD-10-CM | POA: Insufficient documentation

## 2015-03-16 LAB — CBC WITH DIFFERENTIAL/PLATELET
BASOS ABS: 0 10*3/uL (ref 0.0–0.1)
BASOS PCT: 0 %
EOS ABS: 0 10*3/uL (ref 0.0–0.7)
Eosinophils Relative: 1 %
HCT: 34.4 % — ABNORMAL LOW (ref 36.0–46.0)
Hemoglobin: 11.6 g/dL — ABNORMAL LOW (ref 12.0–15.0)
Lymphocytes Relative: 4 %
Lymphs Abs: 0.3 10*3/uL — ABNORMAL LOW (ref 0.7–4.0)
MCH: 29.6 pg (ref 26.0–34.0)
MCHC: 33.7 g/dL (ref 30.0–36.0)
MCV: 87.8 fL (ref 78.0–100.0)
MONO ABS: 0.6 10*3/uL (ref 0.1–1.0)
MONOS PCT: 7 %
NEUTROS PCT: 88 %
Neutro Abs: 7.6 10*3/uL (ref 1.7–7.7)
PLATELETS: 132 10*3/uL — AB (ref 150–400)
RBC: 3.92 MIL/uL (ref 3.87–5.11)
RDW: 13.3 % (ref 11.5–15.5)
WBC: 8.5 10*3/uL (ref 4.0–10.5)

## 2015-03-16 LAB — COMPREHENSIVE METABOLIC PANEL
ALBUMIN: 3.7 g/dL (ref 3.5–5.0)
ALT: 12 U/L — ABNORMAL LOW (ref 14–54)
ANION GAP: 9 (ref 5–15)
AST: 19 U/L (ref 15–41)
Alkaline Phosphatase: 73 U/L (ref 38–126)
BILIRUBIN TOTAL: 0.8 mg/dL (ref 0.3–1.2)
BUN: 20 mg/dL (ref 6–20)
CHLORIDE: 102 mmol/L (ref 101–111)
CO2: 24 mmol/L (ref 22–32)
Calcium: 9 mg/dL (ref 8.9–10.3)
Creatinine, Ser: 0.92 mg/dL (ref 0.44–1.00)
GFR calc Af Amer: >60 mL/min (ref >60)
GFR calc non Af Amer: >60 mL/min (ref >60)
GLUCOSE: 99 mg/dL (ref 65–99)
POTASSIUM: 3.8 mmol/L (ref 3.5–5.1)
SODIUM: 135 mmol/L (ref 135–145)
TOTAL PROTEIN: 7 g/dL (ref 6.5–8.1)

## 2015-03-16 LAB — CBC
HEMATOCRIT: 37.1 % (ref 36.0–46.0)
HEMOGLOBIN: 12.6 g/dL (ref 12.0–15.0)
MCH: 30 pg (ref 26.0–34.0)
MCHC: 34 g/dL (ref 30.0–36.0)
MCV: 88.3 fL (ref 78.0–100.0)
Platelets: 130 10*3/uL — ABNORMAL LOW (ref 150–400)
RBC: 4.2 MIL/uL (ref 3.87–5.11)
RDW: 13.4 % (ref 11.5–15.5)
WBC: 8.8 10*3/uL (ref 4.0–10.5)

## 2015-03-16 LAB — URINALYSIS, ROUTINE W REFLEX MICROSCOPIC
BILIRUBIN URINE: NEGATIVE
Glucose, UA: NEGATIVE mg/dL
Hgb urine dipstick: NEGATIVE
Ketones, ur: NEGATIVE mg/dL
Leukocytes, UA: NEGATIVE
NITRITE: NEGATIVE
PH: 5 (ref 5.0–8.0)
Protein, ur: NEGATIVE mg/dL
SPECIFIC GRAVITY, URINE: 1.006 (ref 1.005–1.030)
UROBILINOGEN UA: 0.2 mg/dL (ref 0.0–1.0)

## 2015-03-16 LAB — LIPASE, BLOOD: LIPASE: 29 U/L (ref 11–51)

## 2015-03-16 MED ORDER — ONDANSETRON 4 MG PO TBDP
4.0000 mg | ORAL_TABLET | Freq: Once | ORAL | Status: AC
  Administered 2015-03-16: 4 mg via ORAL
  Filled 2015-03-16: qty 1

## 2015-03-16 MED ORDER — HEPARIN SOD (PORK) LOCK FLUSH 100 UNIT/ML IV SOLN
500.0000 [IU] | Freq: Once | INTRAVENOUS | Status: AC
  Administered 2015-03-16: 500 [IU]
  Filled 2015-03-16: qty 5

## 2015-03-16 MED ORDER — ONDANSETRON 4 MG PO TBDP: 4.0000 mg | ORAL_TABLET | Freq: Three times a day (TID) | ORAL | Status: DC | PRN

## 2015-03-16 MED ORDER — SODIUM CHLORIDE 0.9 % IV BOLUS (SEPSIS)
1000.0000 mL | Freq: Once | INTRAVENOUS | Status: AC
  Administered 2015-03-16: 1000 mL via INTRAVENOUS

## 2015-03-16 MED ORDER — ONDANSETRON HCL 4 MG/2ML IJ SOLN
4.0000 mg | Freq: Once | INTRAMUSCULAR | Status: AC
  Administered 2015-03-16: 4 mg via INTRAVENOUS
  Filled 2015-03-16: qty 2

## 2015-03-16 NOTE — Assessment & Plan Note (Signed)
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 Adjuvant chemo with TC X 4 (I chose his regimen because patient appears to be slightly older than her stated age and I'm concerned about toxicity to standard full dose chemotherapy with Adriamycin and Cytoxan followed by Taxol. In addition patient is also very reluctant to consider aggressive chemotherapy because she felt that her sister died because of toxicities of chemotherapy.) 2. Adjuvant XRT  Current treatment: Cycle 4 day 1 (Last cycle) (started cycle 1 on 10/15/2014) of Taxotere Cytoxan Chemotherapy toxicities: 1. Oral thrush: Nystatin swish and swallow resolved 2. Clinical dehydration: After each cycle, required IV fluids 3. Severe fatigue: Decreased the dosage of chemotherapy for cycle 2 4. Neutropenic fever: After cycle 1 of chemotherapy hospitalization with Klebsiella urinary tract infection treated with Keflex 5. Urinary tract infection currently on antibiotic  Anemia due to chemotherapy: Currently on iron supplementation. Hemoglobin 9.5  Dehydration: Much improved Treatment plan: 1. Completed adjuvant XRT. 2. There is no role of antiestrogen therapy since she is ER/PR negative.   Return to clinic in 6 months for follow-up

## 2015-03-16 NOTE — Discharge Instructions (Signed)
You have been seen today for fever, nausea, vomiting and diarrhea. Your lab tests showed no abnormalities. Follow up with PCP as needed. Return to ED should symptoms worsen.

## 2015-03-16 NOTE — ED Notes (Addendum)
Patient c/o N/V/D/F x1 day. Reports two episodes of emesis and 10+ episodes of watery diarrhea, denies blood in both. Fever at home 1200mg  tylenol approximately 1730. Immodium approximately 1400.

## 2015-03-16 NOTE — ED Provider Notes (Signed)
CSN: [id number]     Arrival date & time 03/16/15  1741 History   First MD Initiated Contact with Patient 03/16/15 1842     Chief Complaint  Patient presents with  . Fever  . Diarrhea  . Cancer     (Consider location/radiation/quality/duration/timing/severity/associated sxs/prior Treatment) HPI   Angela Bond is a 68 y.o. female, pt with history of breast cancer, presenting with diarrhea, nausea, vomiting, and fever/chills since last night. Pt currently febrile at 101.3. Pt has breast cancer on right side. Last chemo was Aug 8. Last radiation was 10/26. Pt is pt of Dr. Celesta Aver, oncologist. Pt states she has been hospitalized previously for leukopenia. Poor PO intake last night and today. Denies shortness of breath, chest pain, hematochezia, dizziness, or any other complaints.   Past Medical History  Diagnosis Date  . Arthritis   . Hypertension   . GERD (gastroesophageal reflux disease)   . Hypercholesteremia   . Heart murmur   . Anxiety and depression   . H/O hiatal hernia   . Headache(784.0)     sinus  . Dysrhythmia   . Shortness of breath     "stomach is in lungs" when had hernia 2014 none now(07/28/14)  . Pneumonia     hx  . Depression   . Anxiety   . Anemia     hx  . Shingles 5/15    hx  . Breast cancer ([place]) 3/16   Past Surgical History  Procedure Laterality Date  . Total knee arthroplasty Bilateral   . Esophageal manometry N/A 07/02/2012    Procedure: ESOPHAGEAL MANOMETRY (EM);  Surgeon: Lear Ng, MD;  Location: [facility];  Service: Endoscopy;  Laterality: N/A;  schooler to read  . Tonsillectomy  as child  . Hiatal hernia repair N/A 08/10/2012    Procedure: LAPAROSCOPIC REPAIR OF HIATAL HERNIA WITH MESH AND EGD WITH PEG TUBE PLACEMENT;  Surgeon: Ralene Ok, MD;  Location: [facility];  Service: General;  Laterality: N/A;  . Breast surgery      r breast biopsy  . Joint replacement Bilateral 2011,2012    knees  . Eye surgery  2013    tube placed  Left ,tear duct  . Breast lumpectomy with sentinel lymph node biopsy Right 09/22/2014    Procedure: RIGHT BREAST LUMPECTOMY WITH NEEDLE LOCALIZATION AND RIGHT AXILLARY SENTINEL LYMPH NODE BIOPSY;  Surgeon: [name], MD;  Location: [place];  Service: General;  Laterality: Right;  . Portacath placement Left 09/22/2014    Procedure: INSERTION PORT-A-CATH;  Surgeon: [name], MD;  Location: [place];  Service: General;  Laterality: Left;   Family History  Problem Relation Age of Onset  . Heart disease Mother     64s, CHF  . Heart disease Father     d/o MI at 18  . Cancer Father     skin  . Diabetes Father   . Cancer Sister     breast  . Diabetes Sister   . Heart disease Sister     CABG in mid-60s   Social History  Substance Use Topics  . Smoking status: Never Smoker   . Smokeless tobacco: Never Used  . Alcohol Use: No   OB History    No data available     Review of Systems  Constitutional: Positive for fever, chills and fatigue.  Gastrointestinal: Positive for nausea, vomiting and diarrhea.  All other systems reviewed and are negative.     Allergies  Shellfish allergy; Lactose  intolerance (gi); Aleve; Aspirin; Ciprofloxacin; Doxycycline; Evista; Fosamax; Pravachol; Sulfa antibiotics; Welchol; and Zocor  Home Medications   Prior to Admission medications   Medication Sig Start Date End Date Taking? Authorizing Provider  calcium carbonate (OS-CAL) 600 MG TABS Take 600 mg by mouth 2 (two) times daily with a meal.   Yes Historical Provider, MD  carboxymethylcellulose (REFRESH PLUS) 0.5 % SOLN Place 1 drop into both eyes every 3 (three) hours.    Yes Historical Provider, MD  Cholecalciferol (VITAMIN D) 2000 UNITS tablet Take 2,000 Units by mouth 2 (two) times daily.    Yes Historical Provider, MD  cyclobenzaprine (FLEXERIL) 5 MG tablet Take 1 tablet (5 mg total) by mouth 3 (three) times daily as needed for muscle spasms. 02/05/15  Yes Thea Silversmith, MD  dexlansoprazole  (DEXILANT) 60 MG capsule Take 60 mg by mouth daily.   Yes Historical Provider, MD  emollient (BIAFINE) cream Apply 1 application topically 2 (two) times daily. 02/24/15  Yes Thea Silversmith, MD  fexofenadine (ALLEGRA) 180 MG tablet Take 180 mg by mouth daily.   Yes Historical Provider, MD  gabapentin (NEURONTIN) 300 MG capsule Take 300 mg by mouth at bedtime.  09/09/14  Yes Historical Provider, MD  hyaluronate sodium (RADIAPLEXRX) GEL Apply 1 application topically 2 (two) times daily.   Yes Historical Provider, MD  LORazepam (ATIVAN) 1 MG tablet Take 1 mg at bedtime and prior to radiation as directed and as needed for anxiety. Patient taking differently: Take 1 mg by mouth at bedtime. T 01/29/15  Yes Thea Silversmith, MD  lovastatin (MEVACOR) 40 MG tablet Take 40 mg by mouth daily before breakfast.    Yes Historical Provider, MD  meloxicam (MOBIC) 15 MG tablet Take 1 tablet (15 mg total) by mouth daily. 02/17/15  Yes Thea Silversmith, MD  polyethylene glycol (MIRALAX / GLYCOLAX) packet Take 17 g by mouth as needed for mild constipation.    Yes Historical Provider, MD  [place]; [place].   Yes Historical Provider, MD  prochlorperazine (COMPAZINE) 10 MG tablet Take 1 tablet (10 mg total) by mouth every 6 (six) hours as needed (Nausea or vomiting). 09/30/14  Yes Nicholas Lose, MD  sodium chloride (OCEAN) 0.65 % SOLN nasal spray Place 2 sprays into both nostrils as needed for congestion.   Yes Historical Provider, MD  ferrous sulfate 325 (65 FE) MG tablet Take 1 tablet (325 mg total) by mouth 2 (two) times daily with a meal. Patient not taking: Reported on 03/16/2015 10/24/14   Hosie Poisson, MD  fluconazole (DIFLUCAN) 150 MG tablet Take 1 tablet (150 mg total) by mouth once. Patient not taking: Reported on 03/10/2015 12/25/14   Caren Griffins, MD  FLUoxetine (PROZAC) 20 MG capsule Take 20 mg by mouth daily. 06/24/14   Historical Provider, MD  lidocaine-prilocaine (EMLA) cream Apply to affected  area once 09/30/14   Nicholas Lose, MD  meclizine (ANTIVERT) 25 MG tablet Take 12.5-25 mg by mouth 3 (three) times daily as needed for dizziness.    Historical Provider, MD  non-metallic deodorant Jethro Poling) MISC Apply 1 application topically daily as needed.    Historical Provider, MD  nystatin (MYCOSTATIN) 100000 UNIT/ML suspension Take 5 mLs (500,000 Units total) by mouth 4 (four) times daily. Patient not taking: Reported on 03/16/2015 11/11/14   Nicholas Lose, MD  ondansetron (ZOFRAN ODT) 4 MG disintegrating tablet Take 1 tablet (4 mg total) by mouth every 8 (eight) hours as needed for nausea or vomiting. 03/16/15   Shawn C  Joy, PA-C  ondansetron (ZOFRAN) 8 MG tablet Take 1 tablet (8 mg total) by mouth 2 (two) times daily. Start the day after chemo for 3 days. Then take as needed for nausea or vomiting. Patient not taking: Reported on 03/16/2015 09/30/14   Nicholas Lose, MD  oxyCODONE-acetaminophen (ROXICET) 5-325 MG per tablet Take 1-2 tablets by mouth every 4 (four) hours as needed. Patient not taking: Reported on 03/16/2015 02/05/15   Thea Silversmith, MD  saccharomyces boulardii (FLORASTOR) 250 MG capsule Take 1 capsule (250 mg total) by mouth 2 (two) times daily. Patient not taking: Reported on 03/16/2015 10/24/14   Hosie Poisson, MD   BP 115/66 mmHg  Pulse 88  Temp(Src) 100.4 F (38 C) (Oral)  Resp 18  SpO2 94% Physical Exam  Constitutional: She appears well-developed and well-nourished. No distress.  HENT:  Head: Normocephalic and atraumatic.  Eyes: Conjunctivae are normal. Pupils are equal, round, and reactive to light.  Cardiovascular: Normal rate, regular rhythm and normal heart sounds.   Pulmonary/Chest: Effort normal and breath sounds normal. No respiratory distress.  Abdominal: Soft. Bowel sounds are normal.  Musculoskeletal: She exhibits no edema or tenderness.  Neurological: She is alert.  Skin: Skin is warm and dry. She is not diaphoretic.  Nursing note and vitals  reviewed.   ED Course  Procedures (including critical care time) Labs Review Labs Reviewed  COMPREHENSIVE METABOLIC PANEL - Abnormal; Notable for the following:    ALT 12 (*)    All other components within normal limits  CBC - Abnormal; Notable for the following:    Platelets 130 (*)    All other components within normal limits  CBC WITH DIFFERENTIAL/PLATELET - Abnormal; Notable for the following:    Hemoglobin 11.6 (*)    HCT 34.4 (*)    Platelets 132 (*)    Lymphs Abs 0.3 (*)    All other components within normal limits  LIPASE, BLOOD  URINALYSIS, ROUTINE W REFLEX MICROSCOPIC (NOT AT [facility])    Imaging Review No results found. I have personally reviewed and evaluated these images and lab results as part of my medical decision-making.   EKG Interpretation None      MDM   Final diagnoses:  Non-intractable vomiting with nausea, vomiting of unspecified type  Diarrhea, unspecified type  Fever, unspecified fever cause    DONETA BAYMAN presents with N/V/D and fever for the past 24 hours.   Suspect viral gastroenteritis with dehydration. IV fluids, zofran, and labs ordered.  8:11 PM Pt reevaluated. Pt states she feels much better with the fluids and zofran. CBC and CMP show no significant abnormalities.  8:59 PM Pt states she feels much better and would like to go home. Pt evaluation reveals pt appears to be stable for discharge. Pt advised to keep her appointment with her oncologist tomorrow. Pt to take zofran and try to eat something tonight. Pt agreed to plan of care and pt and her husband are both comfortable with discharge.   Lorayne Bender, PA-C 03/16/15 2105  Milton Ferguson, MD 03/16/15 [phone]

## 2015-03-16 NOTE — ED Notes (Signed)
Accessed port, flushed twice but unable draw labs from port.

## 2015-03-17 ENCOUNTER — Encounter: Payer: Self-pay | Admitting: Hematology and Oncology

## 2015-03-17 ENCOUNTER — Ambulatory Visit (INDEPENDENT_AMBULATORY_CARE_PROVIDER_SITE_OTHER): Payer: Medicare Other | Admitting: Podiatry

## 2015-03-17 ENCOUNTER — Ambulatory Visit (HOSPITAL_BASED_OUTPATIENT_CLINIC_OR_DEPARTMENT_OTHER): Payer: Medicare Other

## 2015-03-17 ENCOUNTER — Encounter: Payer: Self-pay | Admitting: Podiatry

## 2015-03-17 ENCOUNTER — Other Ambulatory Visit (HOSPITAL_BASED_OUTPATIENT_CLINIC_OR_DEPARTMENT_OTHER): Payer: Medicare Other

## 2015-03-17 ENCOUNTER — Ambulatory Visit (HOSPITAL_BASED_OUTPATIENT_CLINIC_OR_DEPARTMENT_OTHER): Payer: Medicare Other | Admitting: Hematology and Oncology

## 2015-03-17 ENCOUNTER — Telehealth: Payer: Self-pay | Admitting: Hematology and Oncology

## 2015-03-17 VITALS — BP 134/68 | HR 79 | Temp 99.5°F | Resp 18 | Ht 60.0 in | Wt 156.4 lb

## 2015-03-17 VITALS — BP 98/62 | HR 83 | Resp 16

## 2015-03-17 VITALS — BP 131/57 | HR 74

## 2015-03-17 DIAGNOSIS — C50411 Malignant neoplasm of upper-outer quadrant of right female breast: Secondary | ICD-10-CM

## 2015-03-17 DIAGNOSIS — M722 Plantar fascial fibromatosis: Secondary | ICD-10-CM | POA: Diagnosis not present

## 2015-03-17 DIAGNOSIS — G629 Polyneuropathy, unspecified: Secondary | ICD-10-CM

## 2015-03-17 DIAGNOSIS — E860 Dehydration: Secondary | ICD-10-CM

## 2015-03-17 DIAGNOSIS — Z452 Encounter for adjustment and management of vascular access device: Secondary | ICD-10-CM

## 2015-03-17 DIAGNOSIS — C773 Secondary and unspecified malignant neoplasm of axilla and upper limb lymph nodes: Secondary | ICD-10-CM

## 2015-03-17 DIAGNOSIS — Z171 Estrogen receptor negative status [ER-]: Secondary | ICD-10-CM

## 2015-03-17 LAB — CBC WITH DIFFERENTIAL/PLATELET
BASO%: 0.3 % (ref 0.0–2.0)
BASOS ABS: 0 10*3/uL (ref 0.0–0.1)
EOS%: 1.7 % (ref 0.0–7.0)
Eosinophils Absolute: 0.1 10*3/uL (ref 0.0–0.5)
HEMATOCRIT: 37 % (ref 34.8–46.6)
HGB: 12.4 g/dL (ref 11.6–15.9)
LYMPH#: 0.4 10*3/uL — AB (ref 0.9–3.3)
LYMPH%: 4.7 % — AB (ref 14.0–49.7)
MCH: 29.4 pg (ref 25.1–34.0)
MCHC: 33.6 g/dL (ref 31.5–36.0)
MCV: 87.7 fL (ref 79.5–101.0)
MONO#: 0.7 10*3/uL (ref 0.1–0.9)
MONO%: 8.6 % (ref 0.0–14.0)
NEUT#: 7.3 10*3/uL — ABNORMAL HIGH (ref 1.5–6.5)
NEUT%: 84.7 % — AB (ref 38.4–76.8)
Platelets: 168 10*3/uL (ref 145–400)
RBC: 4.22 10*6/uL (ref 3.70–5.45)
RDW: 13.9 % (ref 11.2–14.5)
WBC: 8.6 10*3/uL (ref 3.9–10.3)

## 2015-03-17 LAB — URINALYSIS, MICROSCOPIC - CHCC
Bilirubin (Urine): NEGATIVE
Blood: NEGATIVE
GLUCOSE UR CHCC: NEGATIVE mg/dL
Ketones: NEGATIVE mg/dL
Nitrite: NEGATIVE
PROTEIN: NEGATIVE mg/dL
Specific Gravity, Urine: 1.01 (ref 1.003–1.035)
UROBILINOGEN UR: 0.2 mg/dL (ref 0.2–1)
pH: 5 (ref 4.6–8.0)

## 2015-03-17 LAB — COMPREHENSIVE METABOLIC PANEL (CC13)
ALT: 94 U/L — ABNORMAL HIGH (ref 0–55)
ANION GAP: 8 meq/L (ref 3–11)
AST: 133 U/L — AB (ref 5–34)
Albumin: 3.5 g/dL (ref 3.5–5.0)
Alkaline Phosphatase: 83 U/L (ref 40–150)
BUN: 16.8 mg/dL (ref 7.0–26.0)
CALCIUM: 9 mg/dL (ref 8.4–10.4)
CHLORIDE: 105 meq/L (ref 98–109)
CO2: 25 meq/L (ref 22–29)
Creatinine: 0.8 mg/dL (ref 0.6–1.1)
EGFR: 70 mL/min/{1.73_m2} — ABNORMAL LOW (ref >90)
Glucose: 84 mg/dl (ref 70–140)
POTASSIUM: 4 meq/L (ref 3.5–5.1)
Sodium: 138 mEq/L (ref 136–145)
Total Bilirubin: 0.77 mg/dL (ref 0.20–1.20)
Total Protein: 7.1 g/dL (ref 6.4–8.3)

## 2015-03-17 MED ORDER — SODIUM CHLORIDE 0.9 % IV SOLN
Freq: Once | INTRAVENOUS | Status: AC
  Administered 2015-03-17: 14:00:00 via INTRAVENOUS
  Filled 2015-03-17: qty 4

## 2015-03-17 MED ORDER — HEPARIN SOD (PORK) LOCK FLUSH 100 UNIT/ML IV SOLN
500.0000 [IU] | Freq: Once | INTRAVENOUS | Status: AC
  Administered 2015-03-17: 500 [IU] via INTRAVENOUS
  Filled 2015-03-17: qty 5

## 2015-03-17 MED ORDER — GABAPENTIN 300 MG PO CAPS: 300.0000 mg | ORAL_CAPSULE | Freq: Three times a day (TID) | ORAL | Status: DC

## 2015-03-17 MED ORDER — ALTEPLASE 2 MG IJ SOLR
2.0000 mg | Freq: Once | INTRAMUSCULAR | Status: AC | PRN
  Administered 2015-03-17: 2 mg
  Filled 2015-03-17: qty 2

## 2015-03-17 MED ORDER — SODIUM CHLORIDE 0.9 % IV SOLN
INTRAVENOUS | Status: AC
  Administered 2015-03-17: 12:00:00 via INTRAVENOUS

## 2015-03-17 MED ORDER — SODIUM CHLORIDE 0.9 % IJ SOLN
10.0000 mL | INTRAMUSCULAR | Status: DC | PRN
  Administered 2015-03-17: 10 mL via INTRAVENOUS
  Filled 2015-03-17: qty 10

## 2015-03-17 NOTE — Progress Notes (Signed)
She presents today with a chief complaint of bilateral foot numbness and tightness. He states that she has just finished her chemotherapy and is currently taking radiation for breast cancer. States that she is currently taking gabapentin 300 mg 1 by mouth daily at bedtime occasionally. She states that she does not take it regularly. Bilateral heel pain times past several months seems to be worsening.  Objective: Vital signs are stable she is alert and oriented 3. Pulses are palpable. Complete loss of protective sensation plantar aspect of the bilateral foot to the level of the ankles. Deep tendon reflexes are not elicitable muscle strength is normal bilateral. Orthopedic evaluation demonstrates all joints is like a full range of motion without crepitation. Mild pes planus is noted bilateral. She has pain on palpation medial calcaneal tubercle bilateral heel.  Assessment: Neuropathy associated with chemotherapy. Recurrence of plantar fasciitis.  Plan: I encouraged her to start taking her gabapentin 300 mg 3 times daily taper up to 3 times a day over the next 2 weeks she will start with 300 mg at night 1 week to be followed by 300 mg twice a day 1 week to be followed by 300 mg 3 times a day. Injected the bilateral heels today with Kenalog and local anesthetic. I will follow-up with her in 1 month.  Roselind Messier DPM

## 2015-03-17 NOTE — Telephone Encounter (Signed)
Appointments made and avs printed for pateint °

## 2015-03-17 NOTE — Progress Notes (Signed)
Patient Care Team: Gaynelle Arabian, MD as PCP - General (Family Medicine) Jerline Pain, MD as Consulting Physician (Cardiology) Wonda Horner, MD as Consulting Physician (Gastroenterology) Ralene Ok, MD as Consulting Physician (Surgery) Nicholas Lose, MD as Consulting Physician (Hematology and Oncology)  DIAGNOSIS: No matching staging information was found for the patient.  SUMMARY OF ONCOLOGIC HISTORY:   Breast cancer of upper-outer quadrant of right female breast ([place])   07/30/2014 Mammogram Right breast 10 o'clock location 4 cm from the nipple measuring 1.7 x 1.3 x 1.3 cm. This corresponds to the palpable and mammographic finding. In the right axilla, there are several lymph nodes which are normal in size measuring up to 7mm.   08/27/2014 Initial Diagnosis Right Breast 10:00: IDC with LVI, Grade 3, Er 0%, PR 0%, Her 2 Neg, Ki 67: 30%   09/22/2014 Surgery Rt Lumpectomy: IDC grade 3, Size 2.6 cm, with HG DCIS, LVI present, ER/PR: 0%; 1/2 LN Positive: T2N1M0 Stage 2B   10/15/2014 -  Chemotherapy Adjuvant chemotherapy with Taxotere Cytoxan 4 q3 weeks   10/21/2014 - 10/24/2014 Hospital Admission Admission for neutropenic fever UTI Klebsiella    Radiation Therapy Adjuvant XRT    CHIEF COMPLIANT: Diarrhea yesterday and feeling weak  INTERVAL HISTORY: Angela Bond is a 69 year old with above-mentioned history of right breast cancer treated with adjuvant chemotherapy followed by adjuvant radiation which was just completed last week. She had diarrhea yesterday and went to the emergency and she was given IV fluids. She felt much better. She did not have any further episodes of diarrhea today. But she just feels weak and tired. She has radiation-related skin changes.  REVIEW OF SYSTEMS:   Constitutional: Denies fevers, chills or abnormal weight loss Eyes: Denies blurriness of vision Ears, nose, mouth, throat, and face: Denies mucositis or sore throat Respiratory: Denies cough, dyspnea or  wheezes Cardiovascular: Denies palpitation, chest discomfort or lower extremity swelling Gastrointestinal:  Denies nausea, heartburn or change in bowel habits Skin: Denies abnormal skin rashes Lymphatics: Denies new lymphadenopathy or easy bruising Neurological:numbness, tingling or new weaknesses Of both lower extremities  Behavioral/Psych: Mood is stable, no new changes  Breast: Radiation dermatitis  All other systems were reviewed with the patient and are negative.  I have reviewed the past medical history, past surgical history, social history and family history with the patient and they are unchanged from previous note.  ALLERGIES:  is allergic to shellfish allergy; lactose intolerance (gi); aleve; aspirin; ciprofloxacin; doxycycline; evista; fosamax; pravachol; sulfa antibiotics; welchol; and zocor.  MEDICATIONS:  Current Outpatient Prescriptions  Medication Sig Dispense Refill  . calcium carbonate (OS-CAL) 600 MG TABS Take 600 mg by mouth 2 (two) times daily with a meal.    . carboxymethylcellulose (REFRESH PLUS) 0.5 % SOLN Place 1 drop into both eyes every 3 (three) hours.     . Cholecalciferol (VITAMIN D) 2000 UNITS tablet Take 2,000 Units by mouth 2 (two) times daily.     . cyclobenzaprine (FLEXERIL) 5 MG tablet Take 1 tablet (5 mg total) by mouth 3 (three) times daily as needed for muscle spasms. 30 tablet 0  . dexlansoprazole (DEXILANT) 60 MG capsule Take 60 mg by mouth daily.    Marland Kitchen emollient (BIAFINE) cream Apply 1 application topically 2 (two) times daily.    . fexofenadine (ALLEGRA) 180 MG tablet Take 180 mg by mouth daily.    Marland Kitchen FLUoxetine (PROZAC) 20 MG capsule Take 20 mg by mouth daily.  11  . gabapentin (NEURONTIN) 300 MG  capsule Take 300 mg by mouth at bedtime.     . hyaluronate sodium (RADIAPLEXRX) GEL Apply 1 application topically 2 (two) times daily.    Marland Kitchen LORazepam (ATIVAN) 1 MG tablet Take 1 mg at bedtime and prior to radiation as directed and as needed for anxiety.  (Patient taking differently: Take 1 mg by mouth at bedtime. T) 60 tablet 1  . lovastatin (MEVACOR) 40 MG tablet Take 40 mg by mouth daily before breakfast.     . meclizine (ANTIVERT) 25 MG tablet Take 12.5-25 mg by mouth 3 (three) times daily as needed for dizziness.    . meloxicam (MOBIC) 15 MG tablet Take 1 tablet (15 mg total) by mouth daily. 60 tablet 1  . non-metallic deodorant (ALRA) MISC Apply 1 application topically daily as needed.    . polyethylene glycol (MIRALAX / GLYCOLAX) packet Take 17 g by mouth as needed for mild constipation.     Marland Kitchen PRESCRIPTION MEDICATION Chemo; [place].    . saccharomyces boulardii (FLORASTOR) 250 MG capsule Take 1 capsule (250 mg total) by mouth 2 (two) times daily. (Patient not taking: Reported on 03/16/2015) 30 capsule 0  . sodium chloride (OCEAN) 0.65 % SOLN nasal spray Place 2 sprays into both nostrils as needed for congestion.     No current facility-administered medications for this visit.   Facility-Administered Medications Ordered in Other Visits  Medication Dose Route Frequency Provider Last Rate Last Dose  . heparin lock flush 100 unit/mL  500 Units Intravenous Once Nicholas Lose, MD      . sodium chloride 0.9 % injection 10 mL  10 mL Intravenous PRN Nicholas Lose, MD        PHYSICAL EXAMINATION: ECOG PERFORMANCE STATUS: 1 - Symptomatic but completely ambulatory  Filed Vitals:   03/17/15 1207  BP: 134/68  Pulse: 79  Temp: 99.5 F (37.5 C)  Resp: 18   Filed Weights   03/17/15 1140  Weight: 156 lb 6.4 oz (70.943 kg)    GENERAL:alert, no distress and comfortable SKIN: skin color, texture, turgor are normal, no rashes or significant lesions EYES: normal, Conjunctiva are pink and non-injected, sclera clear OROPHARYNX:no exudate, no erythema and lips, buccal mucosa, and tongue normal  NECK: supple, thyroid normal size, non-tender, without nodularity LYMPH:  no palpable lymphadenopathy in the cervical, axillary or inguinal LUNGS: clear to  auscultation and percussion with normal breathing effort HEART: regular rate & rhythm and no murmurs and no lower extremity edema ABDOMEN:abdomen soft, non-tender and normal bowel sounds Musculoskeletal:no cyanosis of digits and no clubbing  NEURO: alert & oriented x 3 with fluent speech, peripheral neuropathy  LABORATORY DATA:  I have reviewed the data as listed   Chemistry      Component Value Date/Time   NA 135 03/16/2015 1844   NA 140 12/23/2014 1035   K 3.8 03/16/2015 1844   K 3.5 12/23/2014 1035   CL 102 03/16/2015 1844   CO2 24 03/16/2015 1844   CO2 29 12/23/2014 1035   BUN 20 03/16/2015 1844   BUN 6.9* 12/23/2014 1035   CREATININE 0.92 03/16/2015 1844   CREATININE 0.7 12/23/2014 1035      Component Value Date/Time   CALCIUM 9.0 03/16/2015 1844   CALCIUM 8.6 12/23/2014 1035   ALKPHOS 73 03/16/2015 1844   ALKPHOS 73 12/23/2014 1035   AST 19 03/16/2015 1844   AST 12 12/23/2014 1035   ALT 12* 03/16/2015 1844   ALT 13 12/23/2014 1035   BILITOT 0.8 03/16/2015 1844  BILITOT 0.71 12/23/2014 1035       Lab Results  Component Value Date   WBC 8.6 03/17/2015   HGB 12.4 03/17/2015   HCT 37.0 03/17/2015   MCV 87.7 03/17/2015   PLT 168 03/17/2015   NEUTROABS 7.3* 03/17/2015   4 ASSESSMENT & PLAN:  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 Adjuvant chemo with TC X 4, started 10/15/2014 completed 12/16/2014 (I chose his regimen because patient appears to be slightly older than her stated age and I'm concerned about toxicity to standard full dose chemotherapy with Adriamycin and Cytoxan followed by Taxol. In addition patient is also very reluctant to consider aggressive chemotherapy because she felt that her sister died because of toxicities of chemotherapy.) 2. Adjuvant XRT completed  03/12/2015  Anemia due to chemotherapy: Currently on iron supplementation. Hemoglobin 9.5 Dehydration: Related to diarrhea from yesterday. We will give her IV fluids today. I will request Dr. Marlou Starks to remove her port. Treatment plan: There is no role of antiestrogen therapy since she is ER/PR negative. Patient will go on surveillance for breast cancer.  Return to clinic in 6 months for follow-up   Orders Placed This Encounter  Procedures  . Urine culture    Standing Status: Future     Number of Occurrences: 1     Standing Expiration Date: 03/16/2016  . Blood culture (routine single)    Standing Status: Future     Number of Occurrences: 1     Standing Expiration Date: 03/16/2016  . Urinalysis, Microscopic - [facility]    Standing Status: Future     Number of Occurrences: 1     Standing Expiration Date: 03/16/2016   The patient has a good understanding of the overall plan. she agrees with it. she will call with any problems that may develop before the next visit here.   Rulon Eisenmenger, MD 03/17/2015

## 2015-03-17 NOTE — Addendum Note (Signed)
Addended by: Prentiss Bells on: 03/17/2015 01:41 PM   Modules accepted: Orders, Medications

## 2015-03-17 NOTE — Patient Instructions (Signed)

## 2015-03-19 LAB — URINE CULTURE

## 2015-03-19 NOTE — Progress Notes (Signed)
  Radiation Oncology         [phone] ________________________________  Name: Angela Bond MRN: [id number]  Date: 03/11/2015  DOB: [birth year]  End of Treatment Note  Diagnosis:  T2N1 Right breast cancer  Indication for treatment:  Curative      Radiation treatment dates:   01/26/2015-03/11/2015  Site/dose:    Right breast / 45 Gray @ 1.8 Pearline Cables per fraction x 25 fractions Right supraclavicular fossa/PAB  45 Gy @1 .8 Gy per fraction x 25 fractions Right breast boost / 16 Gray at [employer] per fraction x 8 fractions  Beams/energy:  Opposed Tangents / 6 and 10 MV photons LAO/PA  6 and 10 MV photons En face electrons/ 12 MeV  Narrative: The patient tolerated radiation treatment relatively well.   She had the expected dry desquamation as well as some shoulder and arm pain on the contralateral side which was worked up with an MRI and neurosurgery consult.   Plan: The patient has completed radiation treatment. The patient will return to radiation oncology clinic for routine followup in one month. I advised them to call or return sooner if they have any questions or concerns related to their recovery or treatment.  ------------------------------------------------  Thea Silversmith, MD

## 2015-03-19 NOTE — Progress Notes (Signed)
Name: Angela Bond   MRN: [id number]  Date:  02/18/2015   DOB: Sep 04, 1945  Status:outpatient    DIAGNOSIS: No matching staging information was found for the patient.  CONSENT VERIFIED: yes   SET UP: Patient is setup supine   IMMOBILIZATION:  The following immobilization was used:Custom Moldable Pillow, breast board.   NARRATIVE: Angela Bond underwent complex simulation and treatment planning for her boost treatment today.  Her tumor volume was outlined on the planning CT scan. The depth of her cavity was felt to be appropriate for treatment with electrons    12  MeV electrons will be prescribed to the 100%  isodose line.   I personally oversaw and approved the construction of a unique block which will be used for beam modification purposes.  An isodose plan is requested.

## 2015-03-20 ENCOUNTER — Other Ambulatory Visit: Payer: Self-pay | Admitting: General Surgery

## 2015-03-23 LAB — CULTURE, BLOOD (SINGLE)

## 2015-04-14 ENCOUNTER — Encounter: Payer: Self-pay | Admitting: Podiatry

## 2015-04-14 ENCOUNTER — Ambulatory Visit (INDEPENDENT_AMBULATORY_CARE_PROVIDER_SITE_OTHER): Payer: Medicare Other

## 2015-04-14 ENCOUNTER — Ambulatory Visit (INDEPENDENT_AMBULATORY_CARE_PROVIDER_SITE_OTHER): Payer: Medicare Other | Admitting: Podiatry

## 2015-04-14 VITALS — BP 119/78 | HR 74 | Resp 16

## 2015-04-14 DIAGNOSIS — M722 Plantar fascial fibromatosis: Secondary | ICD-10-CM | POA: Diagnosis not present

## 2015-04-14 DIAGNOSIS — M65271 Calcific tendinitis, right ankle and foot: Secondary | ICD-10-CM

## 2015-04-14 DIAGNOSIS — G629 Polyneuropathy, unspecified: Secondary | ICD-10-CM

## 2015-04-14 DIAGNOSIS — S99911A Unspecified injury of right ankle, initial encounter: Secondary | ICD-10-CM | POA: Diagnosis not present

## 2015-04-14 NOTE — Progress Notes (Signed)
She presents today for follow-up of her plantar fasciitis and her neuropathy. She states she is doing much better. She also goes on to state that she's recently rolled her right ankle while at [employer]. She states that the medial aspect of the ankle hurts and has not sought medical attention. She denies calf pain denies any trauma to the rest of her body.  Objective: Vital signs are stable she is alert and oriented 3. Pulses are strongly palpable. Neurologic sensorium is intact. Deep tendon reflexes are intact. Muscle strength is intact with exception of some tenderness against inversion against resistance. There does appear to be some fluctuance within the tendon sheath. Posterior tibial tendon is sore and painful on palpation and motion against resistance. Radiographs do not demonstrate any type of osseus abnormalities medial aspect of the right foot.  Assessment: Posterior tibial tendinitis right foot.  Plan: Placed her in a tri-lock brace and will follow-up with her as needed for the plantar fasciitis and the posterior tibial tendinitis. I also injected it today with Kenalog and local anesthetic.  Roselind Messier DPM

## 2015-04-15 ENCOUNTER — Encounter: Payer: Self-pay | Admitting: Radiation Oncology

## 2015-04-15 ENCOUNTER — Ambulatory Visit
Admission: RE | Admit: 2015-04-15 | Discharge: 2015-04-15 | Disposition: A | Discharge status: Home / Self Care | Payer: Medicare Other | Source: Ambulatory Visit | Attending: Radiation Oncology | Admitting: Radiation Oncology

## 2015-04-15 VITALS — BP 119/80 | HR 72 | Temp 98.8°F | Ht 60.0 in | Wt 160.6 lb

## 2015-04-15 DIAGNOSIS — C50411 Malignant neoplasm of upper-outer quadrant of right female breast: Secondary | ICD-10-CM

## 2015-04-15 NOTE — Progress Notes (Signed)
   Department of Radiation Oncology  Phone:  [phone] Fax:        [phone]   Name: Angela Bond MRN: [id number]  DOB: 1945/12/09  Date: 04/15/2015  Follow Up Visit Note  Diagnosis: T2N1 Right breast cancer  Summary and Interval since last radiation: The patient's radiation treatment dates extended from 01/26/2015-03/11/2015.  The site and dose include Right breast / 45 Gray @ 1.8 Pearline Cables per fraction x 25 fractions, Right supraclavicular fossa/PAB  45 Gy @1 .8 Gy per fraction x 25 fractions, and Right breast boost / 16 Gray at [employer] per fraction x 8 fractions.  Interval History: Angela Bond presents today for routine followup.  She has done well since finishing radiation. Since she was ER(-) she is not on anti-estrogen therapy. She is scheduled to see survivorship in January. She is accompanied by her husband today. She is feeling much better after having her port removed. She has a brace on her foot now, so she does not fall. Her energy levels have returned. She is eating better. Her neck continues to bother her but is improving as the port heals. She plans to join support group to help others out. She is interested in volunteering in the cancer center. [facility] also signed up for [facility].   Physical Exam:  Filed Vitals:   04/15/15 1407  BP: 119/80  Pulse: 72  Temp: 98.8 F (37.1 C)  TempSrc: Oral  Height: 5' (1.524 m)  Weight: 160 lb 9.6 oz (72.848 kg)  SpO2: 99%    Skin is well healed with some dry skin and some hyperpigmentation superiorly. Excellent cosmetic result.   IMPRESSION: Angela Bond is a 69 y.o. female T2N1 right breast cancer with no evidence of recurrence.  PLAN:  She is doing well. We discussed the need for follow up every 4-6 months which she has scheduled.  We discussed the need for yearly mammograms which she can schedule with her OBGYN or with medical oncology. We discussed the need for sun protection in  the treated area.  She can always call me with questions.  I will follow up with her on an as needed basis.   She is scheduled to meet with survivorship in January. I have scheduled her mammogram to occur before she sees Dr. Lindi Adie next.     Thea Silversmith, MD  This document serves as a record of services personally performed by Thea Silversmith, MD. It was created on her behalf by Arlyce Harman, a trained medical scribe. The creation of this record is based on the scribe's personal observations and the provider's statements to them. This document has been checked and approved by the attending provider.

## 2015-04-15 NOTE — Progress Notes (Signed)
Mrs. Read here today for a F/U visit.  States she feels better today.  Energy level is better . She is eating better.  Skin looks better at radiation site.  BP 119/80 mmHg  Pulse 72  Temp(Src) 98.8 F (37.1 C) (Oral)  Ht 5' (1.524 m)  Wt 160 lb 9.6 oz (72.848 kg)  BMI 31.37 kg/m2  SpO2 99%

## 2015-04-20 ENCOUNTER — Encounter: Payer: Self-pay | Admitting: General Practice

## 2015-04-20 NOTE — Progress Notes (Signed)
Spiritual Care Note  Visited with Angela Bond and her husband today, both of whom were in strong spirits.  Angela Bond was pleased by her hair growth, relieved at port removal, and celebrating overall good health and completion of tx milestones.  She even noted with a smile, "I'm still coloring--but less than I used to," a sign that meditative creativity is still meaningful but less necessary for coping, now that her health has improved, and distress decreased.  She verbalized appreciation for chaplain support/encouragement, as well as deep gratitude for her healing and care at [facility].  [place], [place], [facility] Pager [phone] Voicemail  [phone]

## 2015-04-28 ENCOUNTER — Other Ambulatory Visit: Payer: Self-pay | Admitting: Radiation Oncology

## 2015-05-12 ENCOUNTER — Ambulatory Visit (INDEPENDENT_AMBULATORY_CARE_PROVIDER_SITE_OTHER): Payer: Medicare Other | Admitting: Podiatry

## 2015-05-12 ENCOUNTER — Encounter: Payer: Self-pay | Admitting: Podiatry

## 2015-05-12 VITALS — BP 121/75 | HR 81 | Resp 16

## 2015-05-12 DIAGNOSIS — M76821 Posterior tibial tendinitis, right leg: Secondary | ICD-10-CM

## 2015-05-12 DIAGNOSIS — M779 Enthesopathy, unspecified: Secondary | ICD-10-CM

## 2015-05-12 DIAGNOSIS — M778 Other enthesopathies, not elsewhere classified: Secondary | ICD-10-CM

## 2015-05-12 DIAGNOSIS — M7751 Other enthesopathy of right foot: Secondary | ICD-10-CM

## 2015-05-13 NOTE — Progress Notes (Signed)
She presents today to follow-up for her posterior tibial tendinitis of her right foot. She states that this is doing much better after the last injection and seems to be resolving. She states that the majority of her symptoms at this point reside on the dorsal lateral aspect of the foot just below the ankle. She denies any trauma to the area. She continues to use her Tri-Lock brace on a regular basis.  Objective: Vital signs are stable she is alert and oriented 3. Pulses are strongly palpable. Neurologic sensorium is intact. Much decrease in edema along the posterior tibial tendon as it courses beneath the medial malleolus extending to the navicular tuberosity. There is no longer fluctuance within the tendon sheath. She does however demonstrate pain on palpation of the sinus tarsi on the right foot and pain on sharp inversion and eversion of the subtalar joint. This is indicative of subtalar joint capsulitis.  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 Pes planus right foot. Resolving posterior tibial tendinitis right foot.  Plan: Continue to use Tri-Lock brace regularly. I injected the sinus tarsi and subtalar joint today with Kenalog and local anesthetic after sterile Betadine skin prep. I will follow up with her in 1 month if necessary. She is to continue all other conservative therapies.

## 2015-05-19 ENCOUNTER — Ambulatory Visit (HOSPITAL_BASED_OUTPATIENT_CLINIC_OR_DEPARTMENT_OTHER): Payer: Medicare Other | Admitting: Nurse Practitioner

## 2015-05-19 ENCOUNTER — Encounter: Payer: Self-pay | Admitting: Nurse Practitioner

## 2015-05-19 VITALS — BP 124/54 | HR 64 | Temp 97.8°F | Resp 16 | Wt 160.5 lb

## 2015-05-19 DIAGNOSIS — C50411 Malignant neoplasm of upper-outer quadrant of right female breast: Secondary | ICD-10-CM | POA: Diagnosis not present

## 2015-05-19 DIAGNOSIS — Z171 Estrogen receptor negative status [ER-]: Secondary | ICD-10-CM

## 2015-05-19 DIAGNOSIS — G629 Polyneuropathy, unspecified: Secondary | ICD-10-CM

## 2015-05-19 DIAGNOSIS — C773 Secondary and unspecified malignant neoplasm of axilla and upper limb lymph nodes: Secondary | ICD-10-CM | POA: Diagnosis not present

## 2015-05-19 NOTE — Progress Notes (Signed)
CLINIC:  Cancer Survivorship   REASON FOR VISIT:  Routine follow-up post-treatment for a recent history of breast cancer.  BRIEF ONCOLOGIC HISTORY:    Breast cancer of upper-outer quadrant of right female breast ([place])   07/30/2014 Mammogram Right breast: Mass at 10 o'clock location 4 cm from the nipple measuring 1.7 x 1.3 x 1.3 cm. This corresponds to the palpable and mammographic finding. In the right axilla, there are several lymph nodes which are normal in size measuring up to 17m.   08/27/2014 Initial Biopsy Right breast core needle bx: Invasive ductal carcinoma with LVI, grade 3, ER- (0%), PR- (0%), HER2/neu negative, Ki67 30%   08/27/2014 Clinical Stage Stage IIA: T2 N0   09/22/2014 Definitive Surgery Right lumpectomy/SLNB (Marlou Starks: Invasive ductal carcinoma, grade 3, 2.6 cm, high grade DCIS, LVI present, ER-, PR-, 2 LN removed and 1 positive for metastatic carcinoma (1/2)   09/22/2014 Pathologic Stage Stage IIB: T2 N1 M0   10/15/2014 - 12/16/2014 Chemotherapy Adjuvant chemotherapy with Taxotere Cytoxan 4 q3 weeks   10/21/2014 - 10/24/2014 Hospital Admission Admission for neutropenic fever UTI Klebsiella   01/26/2015 - 03/11/2015 Radiation Therapy Adjuvant RT (Pablo Ledger: Right breast / 45 Gray @ 1.8 GPearline Cablesper fraction x 25 fractions Right supraclavicular fossa/PAB 45 Gy '@1' .8 Gy per fraction x 25 fractions Right breast boost / 16 Gray at 2[employer] fraction x 8 fractions   INTERVAL HISTORY:  [facility] to review her survivorship care plan detailing her treatment course for breast cancer, as well as monitoring long-term side effects of that treatment, education regarding health maintenance, screening, and overall wellness and health promotion.     Overall, Angela Bond reports feeling well since completing her radiation therapy approximately two months ago.  She remains fatigued but reports that the skin changes overlying her right breast  have resolved.  She continues with peripheral neuropathy in her bilateral feet which is not relieved completely with the gabapentin. She has intermittent shooting pain and tenderness along her right breast / axilla, which is self limiting.  She denies any mass or lesion in either breat.  Her hair has begun to regrow. She denies headache, cough, or shortness of breath. She does have left shoulder and back pain which predates her cancer diagnosis and she is seeing her orthopedic doctor tomorrow.  She does report leg cramps. She has a good appetite and denies any weight loss.    REVIEW OF SYSTEMS:  General: Fatigue as above. Denies fever, chills, unintentional weight loss, or night sweats. HEENT: Wears glasses.  Some discomfort along lower gums without lesion.  No regular dental care.  Denies visual changes, hearing loss, mouth sores or difficulty swallowing. Cardiac: Denies palpitations and lower extremity edema.  Respiratory: Denies wheeze or dyspnea on exertion.  Breast: Denies any new nodularity, masses, tenderness, nipple changes, or nipple discharge.  GI: Denies abdominal pain, constipation, diarrhea, nausea, or vomiting.  GU: Denies dysuria, hematuria, vaginal bleeding, vaginal discharge, or vaginal dryness.  Musculoskeletal: Left shoulder and back pain, leg cramps as above.  Neuro: Peripheral neuropathy in bilateral feet. Denies recent fall. Skin: Denies rash, pruritis, or open wounds.  Psych: Denies depression, anxiety, insomnia, or memory loss.   A 14-point review of systems was completed and was negative, except as noted above.  ONCOLOGY TREATMENT TEAM:  1. [facility]  [facility]  2. Medical Oncologist: Dr. GLindi Adie3. Radiation Oncologist: Dr. WPablo Ledger  PAST MEDICAL/SURGICAL HISTORY:  Past Medical History  Diagnosis Date  . Arthritis   . Hypertension   . GERD (gastroesophageal reflux disease)   . Hypercholesteremia   . Heart murmur   . Anxiety and  depression   . H/O hiatal hernia   . Headache(784.0)     sinus  . Dysrhythmia   . Shortness of breath     "stomach is in lungs" when had hernia 2014 none now(07/28/14)  . Pneumonia     hx  . Depression   . Anxiety   . Anemia     hx  . Shingles 5/15    hx  . Breast cancer ([place]) 3/16   Past Surgical History  Procedure Laterality Date  . Total knee arthroplasty Bilateral   . Esophageal manometry N/A 07/02/2012    Procedure: ESOPHAGEAL MANOMETRY (EM);  Surgeon: Lear Ng, MD;  Location: [facility];  Service: Endoscopy;  Laterality: N/A;  schooler to read  . Tonsillectomy  as child  . Hiatal hernia repair N/A 08/10/2012    Procedure: LAPAROSCOPIC REPAIR OF HIATAL HERNIA WITH MESH AND EGD WITH PEG TUBE PLACEMENT;  Surgeon: Ralene Ok, MD;  Location: [facility];  Service: General;  Laterality: N/A;  . Breast surgery      r breast biopsy  . Joint replacement Bilateral 2011,2012    knees  . Eye surgery  2013    tube placed Left ,tear duct  . Breast lumpectomy with sentinel lymph node biopsy Right 09/22/2014    Procedure: RIGHT BREAST LUMPECTOMY WITH NEEDLE LOCALIZATION AND RIGHT AXILLARY SENTINEL LYMPH NODE BIOPSY;  Surgeon: [name], MD;  Location: [place];  Service: General;  Laterality: Right;  . Portacath placement Left 09/22/2014    Procedure: INSERTION PORT-A-CATH;  Surgeon: [name], MD;  Location: [place];  Service: General;  Laterality: Left;    ALLERGIES:  Allergies  Allergen Reactions  . Shellfish Allergy Anaphylaxis    ANAPHYLAXIS TO OYSTERS  . Lactose Intolerance (Gi) Other (See Comments)  . Aleve [Naproxen] Nausea And Vomiting  . Aspirin Other (See Comments)    Burns stomach  . Ciprofloxacin Nausea And Vomiting  . Doxycycline Nausea And Vomiting  . Evista [Raloxifene] Other (See Comments)    Leg aches  . Fosamax [Alendronate Sodium] Other (See Comments)    Leg aches  . Pravachol [Pravastatin] Other (See Comments)    GI upset   . Sulfa  Antibiotics Nausea Only  . Welchol [Colesevelam Hcl] Other (See Comments)    Stomach cramps  . Zocor [Simvastatin] Rash    CURRENT MEDICATIONS:  Current Outpatient Prescriptions on File Prior to Visit  Medication Sig Dispense Refill  . calcium carbonate (OS-CAL) 600 MG TABS Take 600 mg by mouth 2 (two) times daily with a meal.    . carboxymethylcellulose (REFRESH PLUS) 0.5 % SOLN Place 1 drop into both eyes every 3 (three) hours.     . Cholecalciferol (VITAMIN D) 2000 UNITS tablet Take 2,000 Units by mouth 2 (two) times daily.     Marland Kitchen dexlansoprazole (DEXILANT) 60 MG capsule Take 60 mg by mouth daily.    . fexofenadine (ALLEGRA) 180 MG tablet Take 180 mg by mouth daily.    Marland Kitchen FLUoxetine (PROZAC) 20 MG capsule Take 20 mg by mouth daily.  11  . gabapentin (NEURONTIN) 300 MG capsule Take 1 capsule (300 mg total) by mouth 3 (three) times daily. 90 capsule 5  . LORazepam (ATIVAN) 1 MG tablet Take 1 mg at bedtime  and prior to radiation as directed and as needed for anxiety. (Patient taking differently: Take 1 mg by mouth at bedtime. T) 60 tablet 1  . lovastatin (MEVACOR) 40 MG tablet Take 40 mg by mouth daily before breakfast.     . meclizine (ANTIVERT) 25 MG tablet Take 12.5-25 mg by mouth 3 (three) times daily as needed for dizziness.    . meloxicam (MOBIC) 15 MG tablet Take 1 tablet (15 mg total) by mouth daily. 60 tablet 1  . oxyCODONE-acetaminophen (PERCOCET/ROXICET) 5-325 MG tablet TAKE 1 OR 2 TABLETS EVERY 4 HOURS AS NEEDED FOR PAIN  0  . polyethylene glycol (MIRALAX / GLYCOLAX) packet Take 17 g by mouth as needed for mild constipation.     . sodium chloride (OCEAN) 0.65 % SOLN nasal spray Place 2 sprays into both nostrils as needed for congestion.     No current facility-administered medications on file prior to visit.    ONCOLOGIC FAMILY HISTORY:  Family History  Problem Relation Age of Onset  . Heart disease Mother     41s, CHF  . Heart disease Father     d/o MI at 15  . Cancer  Father     skin  . Diabetes Father   . Cancer Sister     breast  . Diabetes Sister   . Heart disease Sister     CABG in mid-60s    GENETIC COUNSELING/TESTING: No   SOCIAL HISTORY:  Angela Bond is married and lives with her spouse in [place], [place].  She has 1 child. Angela Bond is currently retired.  She denies any current or history of tobacco, alcohol, or illicit drug use.     PHYSICAL EXAMINATION:  Vital Signs: Filed Vitals:   05/19/15 1100  BP: 124/54  Pulse: 64  Temp: 97.8 F (36.6 C)  Resp: 16   ECOG Performance Status: 1  General: Well-nourished, well-appearing female in no acute distress.  She is unaccompanied in clinic by her husband today.   HEENT: Head is atraumatic and normocephalic.  Glasses intact. Pupils equal and reactive to light and accomodation. Conjunctivae clear without exudate.  Sclerae anicteric. Oral mucosa is pink, moist, and intact without lesions.  Oropharynx is pink without lesions or erythema.  Lymph: No cervical, supraclavicular, infraclavicular, or axillary lymphadenopathy noted on palpation.  Cardiovascular: Regular rate and rhythm without murmurs, rubs, or gallops. Respiratory: Clear to auscultation bilaterally. Chest expansion symmetric without accessory muscle use on inspiration or expiration.  GI: Abdomen soft and round. No tenderness to palpation. Bowel sounds normoactive in 4 quadrants. GU: Deferred.    Neuro: No focal deficits. Steady gait.  Psych: Mood and affect normal and appropriate for situation.  Extremities: No edema, cyanosis, or clubbing.  Skin: Warm and dry. No open lesions noted.   LABORATORY DATA:  None for this visit.  DIAGNOSTIC IMAGING:  None for this visit.     ASSESSMENT AND PLAN:   1. 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  Angela Bond is doing well without clinical symptoms worrisome for disease recurrence. She will follow-up with her medical oncologist,  Dr. Lindi Adie, in May 2017 with history and physical  examination per surveillance protocol. She will also continue to see Dr. Marlou Starks at six month intervals.  A comprehensive survivorship care plan and treatment summary was reviewed with the patient today detailing her breast cancer diagnosis, treatment course, potential late/long-term effects of treatment, appropriate follow-up care with recommendations for the future, and patient education resources.  A copy of this summary, along with a letter will be sent to the patient's primary care provider via in basket message after today's visit.  Angela Bond is welcome to return to the Survivorship Clinic in the future, as needed; no follow-up will be scheduled at this time.    2. Bone health:  Given Angela Bond's age / history of breast cancer, she is at risk for bone demineralization.  Per her report, her last DEXA scan revealed osteopenia.  She is on calcium and vitamin D supplementation. She was encouraged to increase her consumption of foods rich in calcium and vitamin D as well as to increase her weight-bearing activities. She and her husband are to begin the next session of [facility] at the [facility] next month. She was given education on specific activities to promote bone health.  3. Cancer screening:  Due to Angela Bond's history and her age, she should receive screening for skin cancers, colon cancer, and gynecologic cancers.  The information and recommendations are listed on the patient's comprehensive care plan/treatment summary and were reviewed in detail with the patient.    4. Health  maintenance and wellness promotion: Angela Bond was encouraged to consume 5-7 servings of fruits and vegetables per day. We reviewed the "Nutrition Rainbow" handout, as well as discussed recommendations to maximize nutrition and minimize recurrence, such as increased intake of fruits, vegetables, lean proteins, and minimizing the intake of red meats and processed foods.  She was also encouraged to engage in moderate to vigorous exercise for 30 minutes per day most days of the week. As above, she and her husband (also a cancer survivor) are about to begin the next session of the LIVESTRONG program next month.  She was instructed to limit her alcohol consumption and continue to abstain from tobacco use.  A copy of the "Take Control of Your Health" brochure was given to her reinforcing these recommendations.   5. Support services/counseling: It is not uncommon for this period of the patient's cancer care trajectory to be one of many emotions and stressors. We discussed an opportunity for her to participate in the next session of [facility] ("Finding Your New Normal") support group series designed for patients after they have completed treatment and Angela Bond states that she is already registered for this program.  Angela Bond was encouraged to take advantage of our many other support services programs, support groups, and/or counseling in coping with her new life as a cancer survivor after completing anti-cancer treatment.  She was offered support today through active listening and expressive supportive counseling.  She was given information regarding our available services and encouraged to contact me with any questions or for help enrolling in any of our support group/programs.    A total of 60 minutes of face-to-face time was spent with this patient with greater than 50% of that time in counseling and care-coordination.   Sylvan Cheese, NP  Survivorship Program [facility] [phone]   Note: PRIMARY CARE PROVIDER Simona Huh, [place] [phone]

## 2015-06-10 ENCOUNTER — Other Ambulatory Visit: Payer: Self-pay | Admitting: Radiation Oncology

## 2015-06-10 ENCOUNTER — Emergency Department (HOSPITAL_COMMUNITY)
Admission: EM | Admit: 2015-06-10 | Discharge: 2015-06-10 | Disposition: A | Discharge status: Home / Self Care | Payer: Medicare Other | Attending: Emergency Medicine | Admitting: Emergency Medicine

## 2015-06-10 ENCOUNTER — Emergency Department (HOSPITAL_COMMUNITY): Payer: Medicare Other

## 2015-06-10 ENCOUNTER — Encounter (HOSPITAL_COMMUNITY): Payer: Self-pay | Admitting: *Deleted

## 2015-06-10 DIAGNOSIS — S0990XA Unspecified injury of head, initial encounter: Secondary | ICD-10-CM | POA: Insufficient documentation

## 2015-06-10 DIAGNOSIS — W108XXA Fall (on) (from) other stairs and steps, initial encounter: Secondary | ICD-10-CM | POA: Diagnosis not present

## 2015-06-10 DIAGNOSIS — F329 Major depressive disorder, single episode, unspecified: Secondary | ICD-10-CM | POA: Diagnosis not present

## 2015-06-10 DIAGNOSIS — Y9389 Activity, other specified: Secondary | ICD-10-CM | POA: Insufficient documentation

## 2015-06-10 DIAGNOSIS — F419 Anxiety disorder, unspecified: Secondary | ICD-10-CM | POA: Insufficient documentation

## 2015-06-10 DIAGNOSIS — W19XXXA Unspecified fall, initial encounter: Secondary | ICD-10-CM

## 2015-06-10 DIAGNOSIS — R011 Cardiac murmur, unspecified: Secondary | ICD-10-CM | POA: Diagnosis not present

## 2015-06-10 DIAGNOSIS — N390 Urinary tract infection, site not specified: Secondary | ICD-10-CM | POA: Diagnosis not present

## 2015-06-10 DIAGNOSIS — Z8619 Personal history of other infectious and parasitic diseases: Secondary | ICD-10-CM | POA: Diagnosis not present

## 2015-06-10 DIAGNOSIS — R05 Cough: Secondary | ICD-10-CM | POA: Insufficient documentation

## 2015-06-10 DIAGNOSIS — M1990 Unspecified osteoarthritis, unspecified site: Secondary | ICD-10-CM | POA: Diagnosis not present

## 2015-06-10 DIAGNOSIS — S62521A Displaced fracture of distal phalanx of right thumb, initial encounter for closed fracture: Secondary | ICD-10-CM | POA: Diagnosis not present

## 2015-06-10 DIAGNOSIS — Z79899 Other long term (current) drug therapy: Secondary | ICD-10-CM | POA: Insufficient documentation

## 2015-06-10 DIAGNOSIS — Z862 Personal history of diseases of the blood and blood-forming organs and certain disorders involving the immune mechanism: Secondary | ICD-10-CM | POA: Diagnosis not present

## 2015-06-10 DIAGNOSIS — Z853 Personal history of malignant neoplasm of breast: Secondary | ICD-10-CM | POA: Diagnosis not present

## 2015-06-10 DIAGNOSIS — I10 Essential (primary) hypertension: Secondary | ICD-10-CM | POA: Diagnosis not present

## 2015-06-10 DIAGNOSIS — K219 Gastro-esophageal reflux disease without esophagitis: Secondary | ICD-10-CM | POA: Insufficient documentation

## 2015-06-10 DIAGNOSIS — S6991XA Unspecified injury of right wrist, hand and finger(s), initial encounter: Secondary | ICD-10-CM | POA: Diagnosis present

## 2015-06-10 DIAGNOSIS — Y998 Other external cause status: Secondary | ICD-10-CM | POA: Diagnosis not present

## 2015-06-10 DIAGNOSIS — E7800 Pure hypercholesterolemia, unspecified: Secondary | ICD-10-CM | POA: Insufficient documentation

## 2015-06-10 DIAGNOSIS — S0003XA Contusion of scalp, initial encounter: Secondary | ICD-10-CM | POA: Diagnosis not present

## 2015-06-10 DIAGNOSIS — Z8701 Personal history of pneumonia (recurrent): Secondary | ICD-10-CM | POA: Diagnosis not present

## 2015-06-10 DIAGNOSIS — Y92099 Unspecified place in other non-institutional residence as the place of occurrence of the external cause: Secondary | ICD-10-CM | POA: Diagnosis not present

## 2015-06-10 DIAGNOSIS — S62501A Fracture of unspecified phalanx of right thumb, initial encounter for closed fracture: Secondary | ICD-10-CM

## 2015-06-10 LAB — URINALYSIS, ROUTINE W REFLEX MICROSCOPIC
Bilirubin Urine: NEGATIVE
GLUCOSE, UA: NEGATIVE mg/dL
Hgb urine dipstick: NEGATIVE
KETONES UR: NEGATIVE mg/dL
Nitrite: NEGATIVE
PH: 7.5 (ref 5.0–8.0)
Protein, ur: NEGATIVE mg/dL
Specific Gravity, Urine: 1.005 (ref 1.005–1.030)

## 2015-06-10 LAB — BASIC METABOLIC PANEL
ANION GAP: 14 (ref 5–15)
BUN: 6 mg/dL (ref 6–20)
CALCIUM: 9 mg/dL (ref 8.9–10.3)
CO2: 26 mmol/L (ref 22–32)
Chloride: 100 mmol/L — ABNORMAL LOW (ref 101–111)
Creatinine, Ser: 1.1 mg/dL — ABNORMAL HIGH (ref 0.44–1.00)
GFR calc Af Amer: 58 mL/min — ABNORMAL LOW (ref >60)
GFR calc non Af Amer: 50 mL/min — ABNORMAL LOW (ref >60)
GLUCOSE: 100 mg/dL — AB (ref 65–99)
Potassium: 4.2 mmol/L (ref 3.5–5.1)
Sodium: 140 mmol/L (ref 135–145)

## 2015-06-10 LAB — CBC WITH DIFFERENTIAL/PLATELET
BASOS ABS: 0 10*3/uL (ref 0.0–0.1)
Basophils Relative: 0 %
Eosinophils Absolute: 0.2 10*3/uL (ref 0.0–0.7)
Eosinophils Relative: 2 %
HEMATOCRIT: 36.6 % (ref 36.0–46.0)
Hemoglobin: 11.9 g/dL — ABNORMAL LOW (ref 12.0–15.0)
LYMPHS ABS: 1.1 10*3/uL (ref 0.7–4.0)
LYMPHS PCT: 12 %
MCH: 30.3 pg (ref 26.0–34.0)
MCHC: 32.5 g/dL (ref 30.0–36.0)
MCV: 93.1 fL (ref 78.0–100.0)
MONO ABS: 0.8 10*3/uL (ref 0.1–1.0)
MONOS PCT: 8 %
NEUTROS ABS: 6.9 10*3/uL (ref 1.7–7.7)
Neutrophils Relative %: 78 %
Platelets: 190 10*3/uL (ref 150–400)
RBC: 3.93 MIL/uL (ref 3.87–5.11)
RDW: 14.4 % (ref 11.5–15.5)
WBC: 9 10*3/uL (ref 4.0–10.5)

## 2015-06-10 LAB — URINE MICROSCOPIC-ADD ON: RBC / HPF: NONE SEEN RBC/hpf (ref 0–5)

## 2015-06-10 MED ORDER — CEPHALEXIN 250 MG PO CAPS
500.0000 mg | ORAL_CAPSULE | Freq: Once | ORAL | Status: AC
  Administered 2015-06-10: 500 mg via ORAL
  Filled 2015-06-10: qty 2

## 2015-06-10 MED ORDER — ACETAMINOPHEN 500 MG PO TABS
1000.0000 mg | ORAL_TABLET | Freq: Once | ORAL | Status: AC
  Administered 2015-06-10: 1000 mg via ORAL
  Filled 2015-06-10: qty 2

## 2015-06-10 MED ORDER — CEPHALEXIN 500 MG PO CAPS
500.0000 mg | ORAL_CAPSULE | Freq: Three times a day (TID) | ORAL | Status: DC
Start: 2015-06-10 — End: 2015-11-05

## 2015-06-10 NOTE — ED Provider Notes (Signed)
CSN: [id number]     Arrival date & time 06/10/15  1455 History   First MD Initiated Contact with Patient 06/10/15 1726     Chief Complaint  Patient presents with  . Fall     (Consider location/radiation/quality/duration/timing/severity/associated sxs/prior Treatment) Patient is a 70 y.o. female presenting with fall. The history is provided by the patient.  Fall This is a new problem. The current episode started 12 to 24 hours ago. The problem occurs daily. The problem has not changed since onset.Associated symptoms include headaches (since falling). Pertinent negatives include no chest pain, no abdominal pain and no shortness of breath. Associated symptoms comments: Feels "dizzy" right before falling. Has had low grade fevers (99-100) intermittently that she attributes to possible sinus infection. Coughing up green sputum.. Nothing aggravates the symptoms. Nothing relieves the symptoms. She has tried nothing for the symptoms.    Past Medical History  Diagnosis Date  . Arthritis   . Hypertension   . GERD (gastroesophageal reflux disease)   . Hypercholesteremia   . Heart murmur   . Anxiety and depression   . H/O hiatal hernia   . Headache(784.0)     sinus  . Dysrhythmia   . Shortness of breath     "stomach is in lungs" when had hernia 2014 none now(07/28/14)  . Pneumonia     hx  . Depression   . Anxiety   . Anemia     hx  . Shingles 5/15    hx  . Breast cancer ([facility]) 3/16   Past Surgical History  Procedure Laterality Date  . Total knee arthroplasty Bilateral   . Esophageal manometry N/A 07/02/2012    Procedure: ESOPHAGEAL MANOMETRY (EM);  Surgeon: Lear Ng, MD;  Location: [facility];  Service: Endoscopy;  Laterality: N/A;  schooler to read  . Tonsillectomy  as child  . Hiatal hernia repair N/A 08/10/2012    Procedure: LAPAROSCOPIC REPAIR OF HIATAL HERNIA WITH MESH AND EGD WITH PEG TUBE PLACEMENT;  Surgeon: Ralene Ok, MD;  Location: [facility];  Service: General;   Laterality: N/A;  . Breast surgery      r breast biopsy  . Joint replacement Bilateral 2011,2012    knees  . Eye surgery  2013    tube placed Left ,tear duct  . Breast lumpectomy with sentinel lymph node biopsy Right 09/22/2014    Procedure: RIGHT BREAST LUMPECTOMY WITH NEEDLE LOCALIZATION AND RIGHT AXILLARY SENTINEL LYMPH NODE BIOPSY;  Surgeon: [name], MD;  Location: [facility];  Service: General;  Laterality: Right;  . Portacath placement Left 09/22/2014    Procedure: INSERTION PORT-A-CATH;  Surgeon: [name], MD;  Location: [facility];  Service: General;  Laterality: Left;   Family History  Problem Relation Age of Onset  . Heart disease Mother     77s, CHF  . Heart disease Father     d/o MI at 75  . Cancer Father     skin  . Diabetes Father   . Cancer Sister     breast  . Diabetes Sister   . Heart disease Sister     CABG in mid-60s   Social History  Substance Use Topics  . Smoking status: Never Smoker   . Smokeless tobacco: Never Used  . Alcohol Use: No   OB History    No data available     Review of Systems  Respiratory: Negative for shortness of breath.   Cardiovascular: Negative for chest pain.  Gastrointestinal: Negative for  abdominal pain.  Neurological: Positive for headaches (since falling).  All other systems reviewed and are negative.     Allergies  Shellfish allergy; Lactose intolerance (gi); Aleve; Aspirin; Ciprofloxacin; Doxycycline; Evista; Fosamax; Pravachol; Sulfa antibiotics; Welchol; and Zocor  Home Medications   Prior to Admission medications   Medication Sig Start Date End Date Taking? Authorizing Provider  calcium carbonate (OS-CAL) 600 MG TABS Take 600 mg by mouth 2 (two) times daily with a meal.    Historical Provider, MD  carboxymethylcellulose (REFRESH PLUS) 0.5 % SOLN Place 1 drop into both eyes every 3 (three) hours.     Historical Provider, MD  Cholecalciferol (VITAMIN D) 2000 UNITS tablet Take 2,000 Units by mouth 2 (two) times  daily.     Historical Provider, MD  dexlansoprazole (DEXILANT) 60 MG capsule Take 60 mg by mouth daily.    Historical Provider, MD  fexofenadine (ALLEGRA) 180 MG tablet Take 180 mg by mouth daily.    Historical Provider, MD  FLUoxetine (PROZAC) 20 MG capsule Take 20 mg by mouth daily. 06/24/14   Historical Provider, MD  gabapentin (NEURONTIN) 300 MG capsule Take 1 capsule (300 mg total) by mouth 3 (three) times daily. 03/17/15   Max T Hyatt, DPM  LORazepam (ATIVAN) 1 MG tablet Take 1 mg at bedtime and prior to radiation as directed and as needed for anxiety. Patient taking differently: Take 1 mg by mouth at bedtime. T 01/29/15   Thea Silversmith, MD  lovastatin (MEVACOR) 40 MG tablet Take 40 mg by mouth daily before breakfast.     Historical Provider, MD  meclizine (ANTIVERT) 25 MG tablet Take 12.5-25 mg by mouth 3 (three) times daily as needed for dizziness.    Historical Provider, MD  meloxicam (MOBIC) 15 MG tablet TAKE 1 TABLET (15 MG TOTAL) BY MOUTH DAILY. 06/10/15   Thea Silversmith, MD  oxyCODONE-acetaminophen (PERCOCET/ROXICET) 5-325 MG tablet TAKE 1 OR 2 TABLETS EVERY 4 HOURS AS NEEDED FOR PAIN 02/06/15   Historical Provider, MD  polyethylene glycol (MIRALAX / GLYCOLAX) packet Take 17 g by mouth as needed for mild constipation.     Historical Provider, MD  Probiotic Product (SOLUBLE FIBER/PROBIOTICS PO) Take by mouth.    Historical Provider, MD  sodium chloride (OCEAN) 0.65 % SOLN nasal spray Place 2 sprays into both nostrils as needed for congestion.    Historical Provider, MD   BP 134/94 mmHg  Pulse 82  Temp(Src) 99.7 F (37.6 C)  Resp 18  Ht 5' (1.524 m)  Wt 163 lb 2 oz (73.993 kg)  BMI 31.86 kg/m2  SpO2 100% Physical Exam  Constitutional: She is oriented to person, place, and time. She appears well-developed and well-nourished. No distress.  HENT:  Head: Normocephalic. Head is with contusion (with small hematoma over left temporal skull).    Eyes: Conjunctivae are normal.  Neck:  Neck supple. No tracheal deviation present.  Cardiovascular: Normal rate, regular rhythm and normal heart sounds.   Pulmonary/Chest: Effort normal and breath sounds normal. No respiratory distress. She exhibits no tenderness.  Abdominal: Soft. She exhibits no distension.  Musculoskeletal:       Right hand: She exhibits tenderness (over thumb with in tact UCL to 2 fields of confrontation). Normal sensation noted.       Hands: Neurological: She is alert and oriented to person, place, and time. She has normal strength. No cranial nerve deficit. Coordination normal. GCS eye subscore is 4. GCS verbal subscore is 5. GCS motor subscore is 6.  Skin: Skin  is warm and dry.  Psychiatric: She has a normal mood and affect.    ED Course  Procedures (including critical care time) Labs Review Labs Reviewed  CBC WITH DIFFERENTIAL/PLATELET - Abnormal; Notable for the following:    Hemoglobin 11.9 (*)    All other components within normal limits  BASIC METABOLIC PANEL - Abnormal; Notable for the following:    Chloride 100 (*)    Glucose, Bld 100 (*)    Creatinine, Ser 1.10 (*)    GFR calc non Af Amer 50 (*)    GFR calc Af Amer 58 (*)    All other components within normal limits  URINALYSIS, ROUTINE W REFLEX MICROSCOPIC (NOT AT [facility]) - Abnormal; Notable for the following:    APPearance CLOUDY (*)    Leukocytes, UA MODERATE (*)    All other components within normal limits  URINE MICROSCOPIC-ADD ON - Abnormal; Notable for the following:    Squamous Epithelial / LPF 0-5 (*)    Bacteria, UA MANY (*)    All other components within normal limits  URINE CULTURE    Imaging Review Dg Chest 2 View  06/10/2015  CLINICAL DATA:  Pain following fall.  Dizziness.  Hypertension. EXAM: CHEST  2 VIEW COMPARISON:  October 21, 2014 FINDINGS: There is no edema or consolidation. The heart size and pulmonary vascularity are normal. No adenopathy. There are surgical clips in the gastroesophageal junction region as well as in  the lateral right breast region. IMPRESSION: No edema or consolidation. Electronically Signed   By: Lowella Grip III M.D.   On: 06/10/2015 18:35   Ct Head Wo Contrast  06/10/2015  CLINICAL DATA:  Dizziness. [place] today with left posterior head injury. Initial encounter. Personal history of breast carcinoma. EXAM: CT HEAD WITHOUT CONTRAST TECHNIQUE: Contiguous axial images were obtained from the base of the skull through the vertex without intravenous contrast. COMPARISON:  07/14/2008 FINDINGS: No evidence of intracranial hemorrhage, brain edema, or other signs of acute infarction. No evidence of intracranial mass lesion or mass effect. No abnormal extraaxial fluid collections identified. Ventricles are normal in size. No skull abnormality identified. IMPRESSION: Negative noncontrast head CT. Electronically Signed   By: Earle Gell M.D.   On: 06/10/2015 18:48   Dg Knee Complete 4 Views Left  06/10/2015  CLINICAL DATA:  Status post fall x2 over the past 3 days. Dizziness. Left knee pain. Initial encounter. EXAM: LEFT KNEE - COMPLETE 4+ VIEW COMPARISON:  Plain films left knee 12/22/2010. FINDINGS: Left knee arthroplasty is in place. The device is located without loosening or other evidence of hardware complication. No fracture is seen. There is no joint effusion. IMPRESSION: No acute abnormality. Status post left knee replacement without evidence complication. Electronically Signed   By: Inge Rise M.D.   On: 06/10/2015 18:37   Dg Finger Thumb Right  06/10/2015  CLINICAL DATA:  Fall EXAM: RIGHT THUMB 2+V COMPARISON:  None. FINDINGS: There is an intra-articular and comminuted fracture at the base of the distal phalanx of the thumb with minimal displacement. The smaller dorsal fracture fragment is 6 mm in size. There is a fracture line across the proximal metaphysis. There is a cortical step-off along the articular surface at the head of the first metacarpal. Additional intra-articular fracture cannot  be excluded. Degenerative changes in the IP joint of the thumb are noted. IMPRESSION: Acute intra-articular fracture at the base of the distal phalanx of the thumb. Possible intra-articular fracture at the head of the first metacarpal. Electronically  Signed   By: Marybelle Killings M.D.   On: 06/10/2015 18:34   I have personally reviewed and evaluated these images and lab results as part of my medical decision-making.   EKG Interpretation None      MDM   Final diagnoses:  Urinary tract infection without hematuria, site unspecified  Fall from standing, initial encounter  Scalp hematoma, initial encounter  Fracture of thumb, right, closed, initial encounter    70 y.o. female presents with multiple falls and feeling weak, low grade temp recently without fevers. CT head without signs of stroke or ICH, No significant hematologic or metabolic abnormalities to explain symptoms. Pt sustained thumb fracture in a fall and was immobilized with plan for hand surgery f/u with likely non-operative management. Appears to have UTI which caused her to be off balance and would explain low grade temperature elevation. No SIRS or signs of sepsis/end organ damage. Treated empirically with keflex pending culture. Plan to follow up with PCP as needed and return precautions discussed for worsening or new concerning symptoms.     Leo Grosser, MD 06/11/15 [phone]

## 2015-06-10 NOTE — ED Notes (Signed)
Rt breast cancer  In august  She had chemo and radiation  None now.  Porta cath removed

## 2015-06-10 NOTE — Discharge Instructions (Signed)
Head Injury, Adult You have received a head injury. It does not appear serious at this time. Headaches and vomiting are common following head injury. It should be easy to awaken from sleeping. Sometimes it is necessary for you to stay in the emergency department for a while for observation. Sometimes admission to the hospital may be needed. After injuries such as yours, most problems occur within the first 24 hours, but side effects may occur up to 7-10 days after the injury. It is important for you to carefully monitor your condition and contact your health care provider or seek immediate medical care if there is a change in your condition. WHAT ARE THE TYPES OF HEAD INJURIES? Head injuries can be as minor as a bump. Some head injuries can be more severe. More severe head injuries include:  A jarring injury to the brain (concussion).  A bruise of the brain (contusion). This mean there is bleeding in the brain that can cause swelling.  A cracked skull (skull fracture).  Bleeding in the brain that collects, clots, and forms a bump (hematoma). WHAT CAUSES A HEAD INJURY? A serious head injury is most likely to happen to someone who is in a car wreck and is not wearing a seat belt. Other causes of major head injuries include bicycle or motorcycle accidents, sports injuries, and falls. HOW ARE HEAD INJURIES DIAGNOSED? A complete history of the event leading to the injury and your current symptoms will be helpful in diagnosing head injuries. Many times, pictures of the brain, such as CT or MRI are needed to see the extent of the injury. Often, an overnight hospital stay is necessary for observation.  WHEN SHOULD I SEEK IMMEDIATE MEDICAL CARE?  You should get help right away if:  You have confusion or drowsiness.  You feel sick to your stomach (nauseous) or have continued, forceful vomiting.  You have dizziness or unsteadiness that is getting worse.  You have severe, continued headaches not  relieved by medicine. Only take over-the-counter or prescription medicines for pain, fever, or discomfort as directed by your health care provider.  You do not have normal function of the arms or legs or are unable to walk.  You notice changes in the black spots in the center of the colored part of your eye (pupil).  You have a clear or bloody fluid coming from your nose or ears.  You have a loss of vision. During the next 24 hours after the injury, you must stay with someone who can watch you for the warning signs. This person should contact local emergency services (911 in the [place] if you have seizures, you become unconscious, or you are unable to wake up. HOW CAN I PREVENT A HEAD INJURY IN THE FUTURE? The most important factor for preventing major head injuries is avoiding motor vehicle accidents. To minimize the potential for damage to your head, it is crucial to wear seat belts while riding in motor vehicles. Wearing helmets while bike riding and playing collision sports (like football) is also helpful. Also, avoiding dangerous activities around the house will further help reduce your risk of head injury.  WHEN CAN I RETURN TO NORMAL ACTIVITIES AND ATHLETICS? You should be reevaluated by your health care provider before returning to these activities. If you have any of the following symptoms, you should not return to activities or contact sports until 1 week after the symptoms have stopped:  Persistent headache.  Dizziness or vertigo.  Poor attention and concentration.  Confusion.  Memory problems.  Nausea or vomiting.  Fatigue or tire easily.  Irritability.  Intolerant of bright lights or loud noises.  Anxiety or depression.  Disturbed sleep. MAKE SURE YOU:   Understand these instructions.  Will watch your condition.  Will get help right away if you are not doing well or get worse.   This information is not intended to replace advice given to you by your health  care provider. Make sure you discuss any questions you have with your health care provider.   Document Released: 05/02/2005 Document Revised: 05/23/2014 Document Reviewed: 01/07/2013 Elsevier Interactive Patient Education 2016 [employer] Finger Fracture Fractures of fingers are breaks in the bones of the fingers. There are many types of fractures. There are different ways of treating these fractures. Your health care provider will discuss the best way to treat your fracture. CAUSES Traumatic injury is the main cause of broken fingers. These include:  Injuries while playing sports.  Workplace injuries.  Falls. RISK FACTORS Activities that can increase your risk of finger fractures include:  Sports.  Workplace activities that involve machinery.  A condition called osteoporosis, which can make your bones less dense and cause them to fracture more easily. SIGNS AND SYMPTOMS The main symptoms of a broken finger are pain and swelling within 15 minutes after the injury. Other symptoms include:  Bruising of your finger.  Stiffness of your finger.  Numbness of your finger.  Exposed bones (compound fracture) if the fracture is severe. DIAGNOSIS  The best way to diagnose a broken bone is with X-ray imaging. Additionally, your health care provider will use this X-ray image to evaluate the position of the broken finger bones.  TREATMENT  Finger fractures can be treated with:   Nonreduction--This means the bones are in place. The finger is splinted without changing the positions of the bone pieces. The splint is usually left on for about a week to 10 days. This will depend on your fracture and what your health care provider thinks.  Closed reduction--The bones are put back into position without using surgery. The finger is then splinted.  Open reduction and internal fixation--The fracture site is opened. Then the bone pieces are fixed into place with pins or some type of hardware. This  is seldom required. It depends on the severity of the fracture. HOME CARE INSTRUCTIONS   Follow your health care provider's instructions regarding activities, exercises, and physical therapy.  Only take over-the-counter or prescription medicines for pain, discomfort, or fever as directed by your health care provider. SEEK MEDICAL CARE IF: You have pain or swelling that limits the motion or use of your fingers. SEEK IMMEDIATE MEDICAL CARE IF:  Your finger becomes numb. MAKE SURE YOU:   Understand these instructions.  Will watch your condition.  Will get help right away if you are not doing well or get worse.   This information is not intended to replace advice given to you by your health care provider. Make sure you discuss any questions you have with your health care provider.   Document Released: 08/14/2000 Document Revised: 02/20/2013 Document Reviewed: 12/12/2012 Elsevier Interactive Patient Education 2016 Elsevier Inc. Urinary Tract Infection Urinary tract infections (UTIs) can develop anywhere along your urinary tract. Your urinary tract is your body's drainage system for removing wastes and extra water. Your urinary tract includes two kidneys, two ureters, a bladder, and a urethra. Your kidneys are a pair of bean-shaped organs. Each kidney is about the size of your fist. They are  located below your ribs, one on each side of your spine. CAUSES Infections are caused by microbes, which are microscopic organisms, including fungi, viruses, and bacteria. These organisms are so small that they can only be seen through a microscope. Bacteria are the microbes that most commonly cause UTIs. SYMPTOMS  Symptoms of UTIs may vary by age and gender of the patient and by the location of the infection. Symptoms in young women typically include a frequent and intense urge to urinate and a painful, burning feeling in the bladder or urethra during urination. Older women and men are more likely to be  tired, shaky, and weak and have muscle aches and abdominal pain. A fever may mean the infection is in your kidneys. Other symptoms of a kidney infection include pain in your back or sides below the ribs, nausea, and vomiting. DIAGNOSIS To diagnose a UTI, your caregiver will ask you about your symptoms. Your caregiver will also ask you to provide a urine sample. The urine sample will be tested for bacteria and white blood cells. White blood cells are made by your body to help fight infection. TREATMENT  Typically, UTIs can be treated with medication. Because most UTIs are caused by a bacterial infection, they usually can be treated with the use of antibiotics. The choice of antibiotic and length of treatment depend on your symptoms and the type of bacteria causing your infection. HOME CARE INSTRUCTIONS  If you were prescribed antibiotics, take them exactly as your caregiver instructs you. Finish the medication even if you feel better after you have only taken some of the medication.  Drink enough water and fluids to keep your urine clear or pale yellow.  Avoid caffeine, tea, and carbonated beverages. They tend to irritate your bladder.  Empty your bladder often. Avoid holding urine for long periods of time.  Empty your bladder before and after sexual intercourse.  After a bowel movement, women should cleanse from front to back. Use each tissue only once. SEEK MEDICAL CARE IF:   You have back pain.  You develop a fever.  Your symptoms do not begin to resolve within 3 days. SEEK IMMEDIATE MEDICAL CARE IF:   You have severe back pain or lower abdominal pain.  You develop chills.  You have nausea or vomiting.  You have continued burning or discomfort with urination. MAKE SURE YOU:   Understand these instructions.  Will watch your condition.  Will get help right away if you are not doing well or get worse.   This information is not intended to replace advice given to you by your  health care provider. Make sure you discuss any questions you have with your health care provider.   Document Released: 02/09/2005 Document Revised: 01/21/2015 Document Reviewed: 06/10/2011 Elsevier Interactive Patient Education [employer].

## 2015-06-10 NOTE — ED Notes (Signed)
Fall goling up wet steps at home at home today.  She struck her rt head rt thumb  Lt lower leg.  No loc

## 2015-06-11 ENCOUNTER — Telehealth (HOSPITAL_BASED_OUTPATIENT_CLINIC_OR_DEPARTMENT_OTHER): Payer: Self-pay | Admitting: Emergency Medicine

## 2015-06-12 LAB — URINE CULTURE: Culture: >=100000

## 2015-06-15 ENCOUNTER — Telehealth (HOSPITAL_COMMUNITY): Payer: Self-pay

## 2015-06-15 DIAGNOSIS — M25541 Pain in joints of right hand: Secondary | ICD-10-CM | POA: Insufficient documentation

## 2015-06-15 DIAGNOSIS — R600 Localized edema: Secondary | ICD-10-CM | POA: Insufficient documentation

## 2015-06-15 NOTE — Telephone Encounter (Signed)
Post ED Visit - Positive Culture Follow-up  Culture report reviewed by antimicrobial stewardship pharmacist:  []  Elenor Quinones, Pharm.D. []  Heide Guile, Pharm.D., BCPS [x]  Parks Neptune, Pharm.D. []  Alycia Rossetti, Pharm.D., BCPS []  [place], Pharm.D., BCPS, AAHIVP []  Legrand Como, Pharm.D., BCPS, AAHIVP []  Milus Glazier, Pharm.D. []  Rob Evette Doffing, Pharm.D.  Positive urine culture, >/= 100,000 colonies -> E Coli Treated with Cephalexin, organism sensitive to the same and no further patient follow-up is required at this time.  Dortha Kern 06/15/2015, 3:39 AM

## 2015-06-17 HISTORY — PX: PORT-A-CATH REMOVAL: SHX5289

## 2015-06-23 ENCOUNTER — Ambulatory Visit: Payer: Medicare Other | Admitting: Podiatry

## 2015-07-27 ENCOUNTER — Other Ambulatory Visit: Payer: Self-pay | Admitting: Radiation Oncology

## 2015-07-27 DIAGNOSIS — C50411 Malignant neoplasm of upper-outer quadrant of right female breast: Secondary | ICD-10-CM

## 2015-07-29 ENCOUNTER — Other Ambulatory Visit: Payer: Self-pay | Admitting: Radiation Oncology

## 2015-07-29 DIAGNOSIS — C50411 Malignant neoplasm of upper-outer quadrant of right female breast: Secondary | ICD-10-CM

## 2015-08-04 ENCOUNTER — Ambulatory Visit
Admission: RE | Admit: 2015-08-04 | Discharge: 2015-08-04 | Disposition: A | Discharge status: Home / Self Care | Payer: Medicare Other | Source: Ambulatory Visit | Attending: Radiation Oncology | Admitting: Radiation Oncology

## 2015-08-04 DIAGNOSIS — C50411 Malignant neoplasm of upper-outer quadrant of right female breast: Secondary | ICD-10-CM

## 2015-08-06 ENCOUNTER — Other Ambulatory Visit: Payer: Self-pay

## 2015-08-06 MED ORDER — MELOXICAM 15 MG PO TABS: ORAL_TABLET | ORAL | Status: DC

## 2015-08-07 ENCOUNTER — Telehealth: Payer: Self-pay | Admitting: *Deleted

## 2015-08-07 MED ORDER — GABAPENTIN 300 MG PO CAPS: 300.0000 mg | ORAL_CAPSULE | Freq: Three times a day (TID) | ORAL | Status: DC

## 2015-09-01 NOTE — Telephone Encounter (Signed)
Entered in error

## 2015-09-14 HISTORY — PX: CARPAL TUNNEL RELEASE: SHX101

## 2015-09-15 ENCOUNTER — Telehealth: Payer: Self-pay | Admitting: Hematology and Oncology

## 2015-09-15 ENCOUNTER — Ambulatory Visit (HOSPITAL_BASED_OUTPATIENT_CLINIC_OR_DEPARTMENT_OTHER): Payer: Medicare Other | Admitting: Hematology and Oncology

## 2015-09-15 ENCOUNTER — Encounter: Payer: Self-pay | Admitting: Hematology and Oncology

## 2015-09-15 VITALS — BP 154/94 | HR 70 | Temp 97.9°F | Resp 18 | Ht 60.0 in | Wt 167.8 lb

## 2015-09-15 DIAGNOSIS — R42 Dizziness and giddiness: Secondary | ICD-10-CM | POA: Diagnosis not present

## 2015-09-15 DIAGNOSIS — R270 Ataxia, unspecified: Secondary | ICD-10-CM

## 2015-09-15 DIAGNOSIS — D6481 Anemia due to antineoplastic chemotherapy: Secondary | ICD-10-CM | POA: Diagnosis not present

## 2015-09-15 DIAGNOSIS — Z853 Personal history of malignant neoplasm of breast: Secondary | ICD-10-CM

## 2015-09-15 DIAGNOSIS — C50411 Malignant neoplasm of upper-outer quadrant of right female breast: Secondary | ICD-10-CM

## 2015-09-15 NOTE — Telephone Encounter (Signed)
appt made and avs printed. MRI to be sch by central radiology °

## 2015-09-15 NOTE — Progress Notes (Signed)
Patient Care Team: Gaynelle Arabian, MD as PCP - General (Family Medicine) Jerline Pain, MD as Consulting Physician (Cardiology) Wonda Horner, MD as Consulting Physician (Gastroenterology) Ralene Ok, MD as Consulting Physician (Surgery) Nicholas Lose, MD as Consulting Physician (Hematology and Oncology) Sylvan Cheese, NP as Nurse Practitioner (Hematology and Oncology) [name], MD as Consulting Physician (General Surgery) Thea Silversmith, MD as Consulting Physician (Radiation Oncology)  DIAGNOSIS: Breast cancer of upper-outer quadrant of right female breast [facility])   Staging form: Breast, AJCC 7th Edition     Clinical stage from 08/27/2014: Stage IIA (T2, N0, M0) - Unsigned     Pathologic stage from 09/22/2014: Stage IIB (T2, N1, cM0) - Unsigned   SUMMARY OF ONCOLOGIC HISTORY:   Breast cancer of upper-outer quadrant of right female breast ([place])   07/30/2014 Mammogram Right breast: Mass at 10 o'clock location 4 cm from the nipple measuring 1.7 x 1.3 x 1.3 cm. This corresponds to the palpable and mammographic finding. In the right axilla, there are several lymph nodes which are normal in size measuring up to 70m.   08/27/2014 Initial Biopsy Right breast core needle bx: Invasive ductal carcinoma with LVI, grade 3, ER- (0%), PR- (0%), HER2/neu negative, Ki67 30%   08/27/2014 Clinical Stage Stage IIA: T2 N0   09/22/2014 Definitive Surgery Right lumpectomy/SLNB (Marlou Starks: Invasive ductal carcinoma, grade 3, 2.6 cm, high grade DCIS, LVI present, ER-, PR-, 2 LN removed and 1 positive for metastatic carcinoma (1/2)   09/22/2014 Pathologic Stage Stage IIB: T2 N1 M0   10/15/2014 - 12/16/2014 Chemotherapy Adjuvant chemotherapy with Taxotere Cytoxan 4 q3 weeks   10/21/2014 - 10/24/2014 Hospital Admission Admission for neutropenic fever UTI Klebsiella   01/26/2015 - 03/11/2015 Radiation Therapy Adjuvant RT (Pablo Ledger: Right breast / 45 Gray @ 1.8 GPearline Cablesper fraction x 25 fractions Right supraclavicular  fossa/PAB 45 Gy '@1' .8 Gy per fraction x 25 fractions Right breast boost / 16 Gray at 2[employer] Corporationper fraction x 8 fractions   05/19/2015 Survivorship Survivorship care plan visit completed.    CHIEF COMPLIANT: Recent problems with ataxia and dizziness and falls  INTERVAL HISTORY: Angela BRUNEis a 71year old with above-mentioned C right breast cancer who underwent lumpectomy followed by adjuvant chemotherapy and radiation and is currently on surveillance. She does note the past week she has had some dizziness lightheadedness ataxia in the fall which caused trauma to the right thumb. Her blood pressure today slightly elevated than before. She denies any lumps or nodules in the breast. She has difficulty with range of motion in the right arm.  REVIEW OF SYSTEMS:   Constitutional: Denies fevers, chills or abnormal weight loss Eyes: Denies blurriness of vision Ears, nose, mouth, throat, and face: Denies mucositis or sore throat Respiratory: Denies cough, dyspnea or wheezes Cardiovascular: Denies palpitation, chest discomfort Gastrointestinal:  Denies nausea, heartburn or change in bowel habits Skin: Denies abnormal skin rashes Lymphatics: Denies new lymphadenopathy or easy bruising Neurological:Denies numbness, tingling or new weaknesses Behavioral/Psych: Mood is stable, no new changes  Extremities: No lower extremity edema Breast:  denies any pain or lumps or nodules in either breasts All other systems were reviewed with the patient and are negative.  I have reviewed the past medical history, past surgical history, social history and family history with the patient and they are unchanged from previous note.  ALLERGIES:  is allergic to shellfish allergy; lactose intolerance (gi); aleve; aspirin; ciprofloxacin; doxycycline; evista; fosamax; pravachol; sulfa antibiotics; welchol; and zocor.  MEDICATIONS:  Current Outpatient Prescriptions  Medication Sig Dispense Refill  . ampicillin (PRINCIPEN)  500 MG capsule 1 CAPSULE THREE TIMES A DAY ORALLY 7 DAYS  0  . calcium carbonate (OS-CAL) 600 MG TABS Take 600 mg by mouth 2 (two) times daily with a meal.    . carboxymethylcellulose (REFRESH PLUS) 0.5 % SOLN Place 1 drop into both eyes every 3 (three) hours.     . cephALEXin (KEFLEX) 500 MG capsule Take 1 capsule (500 mg total) by mouth 3 (three) times daily. 30 capsule 0  . Cholecalciferol (VITAMIN D) 2000 UNITS tablet Take 2,000 Units by mouth 2 (two) times daily.     Marland Kitchen dexlansoprazole (DEXILANT) 60 MG capsule Take 60 mg by mouth daily.    . fexofenadine (ALLEGRA) 180 MG tablet Take 180 mg by mouth daily.    Marland Kitchen FLUoxetine (PROZAC) 20 MG capsule Take 20 mg by mouth daily.  11  . gabapentin (NEURONTIN) 300 MG capsule Take 1 capsule (300 mg total) by mouth 3 (three) times daily. 90 capsule 5  . LORazepam (ATIVAN) 1 MG tablet Take 1 mg at bedtime and prior to radiation as directed and as needed for anxiety. (Patient taking differently: Take 1 mg by mouth at bedtime. T) 60 tablet 1  . lovastatin (MEVACOR) 40 MG tablet Take 40 mg by mouth at bedtime.     . meclizine (ANTIVERT) 25 MG tablet Take 12.5-25 mg by mouth 3 (three) times daily as needed for dizziness.    . meloxicam (MOBIC) 15 MG tablet TAKE 1 TABLET (15 MG TOTAL) BY MOUTH DAILY. 60 tablet 1  . sodium chloride (OCEAN) 0.65 % SOLN nasal spray Place 2 sprays into both nostrils as needed for congestion.     No current facility-administered medications for this visit.    PHYSICAL EXAMINATION: ECOG PERFORMANCE STATUS: 1 - Symptomatic but completely ambulatory  Filed Vitals:   09/15/15 1036  BP: 154/94  Pulse: 70  Temp: 97.9 F (36.6 C)  Resp: 18   Filed Weights   09/15/15 1036  Weight: 167 lb 12.8 oz (76.114 kg)    GENERAL:alert, no distress and comfortable SKIN: skin color, texture, turgor are normal, no rashes or significant lesions EYES: normal, Conjunctiva are pink and non-injected, sclera clear OROPHARYNX:no exudate, no  erythema and lips, buccal mucosa, and tongue normal  NECK: supple, thyroid normal size, non-tender, without nodularity LYMPH:  no palpable lymphadenopathy in the cervical, axillary or inguinal LUNGS: clear to auscultation and percussion with normal breathing effort HEART: regular rate & rhythm and no murmurs and no lower extremity edema ABDOMEN:abdomen soft, non-tender and normal bowel sounds MUSCULOSKELETAL:no cyanosis of digits and no clubbing  NEURO: alert & oriented x 3 with fluent speech, no focal motor/sensory deficits EXTREMITIES: No lower extremity edema LABORATORY DATA:  I have reviewed the data as listed   Chemistry      Component Value Date/Time   NA 140 06/10/2015 1846   NA 138 03/17/2015 1030   K 4.2 06/10/2015 1846   K 4.0 03/17/2015 1030   CL 100* 06/10/2015 1846   CO2 26 06/10/2015 1846   CO2 25 03/17/2015 1030   BUN 6 06/10/2015 1846   BUN 16.8 03/17/2015 1030   CREATININE 1.10* 06/10/2015 1846   CREATININE 0.8 03/17/2015 1030      Component Value Date/Time   CALCIUM 9.0 06/10/2015 1846   CALCIUM 9.0 03/17/2015 1030   ALKPHOS 83 03/17/2015 1030   ALKPHOS 73 03/16/2015 1844   AST 133* 03/17/2015 1030  AST 19 03/16/2015 1844   ALT 94* 03/17/2015 1030   ALT 12* 03/16/2015 1844   BILITOT 0.77 03/17/2015 1030   BILITOT 0.8 03/16/2015 1844       Lab Results  Component Value Date   WBC 9.0 06/10/2015   HGB 11.9* 06/10/2015   HCT 36.6 06/10/2015   MCV 93.1 06/10/2015   PLT 190 06/10/2015   NEUTROABS 6.9 06/10/2015   ASSESSMENT & PLAN:  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 Adjuvant chemo with TC X 4, started 10/15/2014 completed 12/16/2014 (I chose his regimen because patient appears to be slightly older than her stated age and I'm concerned about toxicity to  standard full dose chemotherapy with Adriamycin and Cytoxan followed by Taxol. In addition patient is also very reluctant to consider aggressive chemotherapy because she felt that her sister died because of toxicities of chemotherapy.) 2. 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 Hemoglobin 9.5 Treatment plan: There is no role of antiestrogen therapy since she is ER/PR negative. Patient will go on surveillance for breast cancer.  Ataxia and dizziness with recent fall: I would like to obtain an MRI of the brain. I will call her with the result of the MRI Physical therapy consult for limitation of range of motion in the right arm  Patient completed live strong program as well as [facility] program Return to clinic in 6 months for follow-up   No orders of the defined types were placed in this encounter.   The patient has a good understanding of the overall plan. she agrees with it. she will call with any problems that may develop before the next visit here.   Rulon Eisenmenger, MD 09/15/2015

## 2015-09-15 NOTE — Assessment & Plan Note (Signed)
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 Adjuvant chemo with TC X 4, started 10/15/2014 completed 12/16/2014 (I chose his regimen because patient appears to be slightly older than her stated age and I'm concerned about toxicity to standard full dose chemotherapy with Adriamycin and Cytoxan followed by Taxol. In addition patient is also very reluctant to consider aggressive chemotherapy because she felt that her sister died because of toxicities of chemotherapy.) 2. 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 Hemoglobin 9.5 Treatment plan: There is no role of antiestrogen therapy since she is ER/PR negative. Patient will go on surveillance for breast cancer.  Return to clinic in 6 months for follow-up

## 2015-09-16 ENCOUNTER — Ambulatory Visit: Payer: Medicare Other | Attending: Hematology and Oncology | Admitting: Physical Therapy

## 2015-09-16 DIAGNOSIS — M25511 Pain in right shoulder: Secondary | ICD-10-CM | POA: Diagnosis not present

## 2015-09-16 DIAGNOSIS — M25611 Stiffness of right shoulder, not elsewhere classified: Secondary | ICD-10-CM | POA: Diagnosis present

## 2015-09-16 NOTE — Therapy (Signed)
[place], [place], [address] Phone: [phone]   Fax:  [phone]  Physical Therapy Evaluation  Patient Details  Name: Angela Bond MRN: [id number] Date of Birth: February 01, 1946 Referring Provider: Dr. Lindi Adie   Encounter Date: 09/16/2015      PT End of Session - 09/16/15 1310    Visit Number 1   Number of Visits 5   Date for PT Re-Evaluation 10/21/15   PT Start Time 1015   PT Stop Time 1100   PT Time Calculation (min) 45 min   Activity Tolerance Patient tolerated treatment well   Behavior During Therapy [facility] for tasks assessed/performed      Past Medical History  Diagnosis Date  . Arthritis   . Hypertension   . GERD (gastroesophageal reflux disease)   . Hypercholesteremia   . Heart murmur   . Anxiety and depression   . H/O hiatal hernia   . Headache(784.0)     sinus  . Dysrhythmia   . Shortness of breath     "stomach is in lungs" when had hernia 2014 none now(07/28/14)  . Pneumonia     hx  . Depression   . Anxiety   . Anemia     hx  . Shingles 5/15    hx  . Breast cancer ([place]) 3/16    Past Surgical History  Procedure Laterality Date  . Total knee arthroplasty Bilateral   . Esophageal manometry N/A 07/02/2012    Procedure: ESOPHAGEAL MANOMETRY (EM);  Surgeon: Lear Ng, MD;  Location: [facility];  Service: Endoscopy;  Laterality: N/A;  schooler to read  . Tonsillectomy  as child  . Hiatal hernia repair N/A 08/10/2012    Procedure: LAPAROSCOPIC REPAIR OF HIATAL HERNIA WITH MESH AND EGD WITH PEG TUBE PLACEMENT;  Surgeon: Ralene Ok, MD;  Location: [facility];  Service: General;  Laterality: N/A;  . Breast surgery      r breast biopsy  . Joint replacement Bilateral 2011,2012    knees  . Eye surgery  2013    tube placed Left ,tear duct  . Breast lumpectomy with sentinel lymph node biopsy Right 09/22/2014    Procedure: RIGHT BREAST LUMPECTOMY WITH NEEDLE LOCALIZATION AND  RIGHT AXILLARY SENTINEL LYMPH NODE BIOPSY;  Surgeon: [name], MD;  Location: [place];  Service: General;  Laterality: Right;  . Portacath placement Left 09/22/2014    Procedure: INSERTION PORT-A-CATH;  Surgeon: [name], MD;  Location: [place];  Service: General;  Laterality: Left;    There were no vitals filed for this visit.       Subjective Assessment - 09/16/15 1025    Subjective My arm seems to be getting heavy and its been aching. She states her arm gets tired real easy and it just wants to drop  She has a $35 copay and does not have a lot of money . She said she has been through the [employer] ( just finished in April) and the [employer]    Pertinent History right breast cancer with lumpectomy with 2 lymph nodes removed 09/22/2014, chemotherapy 10/15/2014-12/16/2014, radiation 9/12-10/26/2016.  Pt has also been having problems with ataxia, dizziness and falls and will have MRI.  She is referred to this episode of PT to work on improving shoulder range of motion, she has a compression bra on order  Pt is having surgery on right  thumb on May 10    Patient Stated Goals to be able  to use right arm more. to learn about the exercises and then do the [employer] program at [employer]    Currently in Pain? Yes   Pain Score 4    Pain Location Axilla   Pain Orientation Right   Pain Descriptors / Indicators Throbbing   Pain Type Chronic pain   Pain Relieving Factors Pt got relief with active movment             [facility] PT Assessment - 09/16/15 0001    Assessment   Medical Diagnosis right breast cancer    Referring Provider Dr. Lindi Adie    Onset Date/Surgical Date 09/22/14   Hand Dominance Right   Precautions   Precautions Other (comment)   Precaution Comments previous cancer    Restrictions   Weight Bearing Restrictions No   Balance Screen   Has the patient fallen in the past 6 months Yes   How many times? alot and she gets dizzy   during chemo    Has the patient had  a decrease in activity level because of a fear of falling?  Yes   Is the patient reluctant to leave their home because of a fear of falling?  No  will not address dizziness and falls this episode    [place] residence   Living Arrangements Spouse/significant other  spouse has health problems    Available Help at Discharge Other (Comment)  son lives out of town    Type of [place] to enter   [employer] of Steps 5  pt has to be careful    Entrance Stairs-Rails Can reach both   [employer] - 2 wheels;Cane - single point   Prior Function   Level of Independence Independent  "I manage "    Vocation Retired   Leisure help others as she can, reads and colors, wants to exercise more    Cognition   Overall Cognitive Status Within Functional Limits for tasks assessed   Observation/Other Assessments   Observations well healed incision in right axilla anda breast    Posture/Postural Control   Posture/Postural Control Postural limitations   Postural Limitations Rounded Shoulders;Forward head   AROM   Right Shoulder Flexion 120 Degrees   Right Shoulder ABduction 109 Degrees   Right Shoulder Internal Rotation 60 Degrees  in supine    Right Shoulder External Rotation 60 Degrees  in supine    Left Shoulder Flexion 165 Degrees   Left Shoulder ABduction 158 Degrees   Left Shoulder External Rotation 85 Degrees   Palpation   Palpation comment tender tightness at axilla , assymetrical scapular mobility with decreased scapular mobilty            LYMPHEDEMA/ONCOLOGY QUESTIONNAIRE - 09/16/15 1048    Right Upper Extremity Lymphedema   15 cm Proximal to Olecranon Process 34 cm   Olecranon Process 24.9 cm   10 cm Proximal to Ulnar Styloid Process 23 cm   Just Proximal to Ulnar Styloid Process 15 cm   Across Hand at [employer] 16.4 cm   At [place] of 2nd Digit 5.7 cm   Left Upper Extremity Lymphedema   15 cm  Proximal to Olecranon Process 34.5 cm   Olecranon Process 25 cm   10 cm Proximal to Ulnar Styloid Process 23 cm   Just Proximal to Ulnar Styloid Process 14.5 cm   Across Hand at [employer] 16.4 cm   At  Base of 2nd Digit 5.6 cm           Katina Dung - 09/16/15 0001    Open a tight or new jar Mild difficulty   Do heavy household chores (wash walls, wash floors) Severe difficulty   Carry a shopping bag or briefcase Mild difficulty   Wash your back Moderate difficulty   Use a knife to cut food Mild difficulty   Recreational activities in which you take some force or impact through your arm, shoulder, or hand (golf, hammering, tennis) Mild difficulty   During the past week, to what extent has your arm, shoulder or hand problem interfered with your normal social activities with family, friends, neighbors, or groups? Slightly   During the past week, to what extent has your arm, shoulder or hand problem limited your work or other regular daily activities Modererately   Arm, shoulder, or hand pain. Mild   Tingling (pins and needles) in your arm, shoulder, or hand Severe   Difficulty Sleeping No difficulty   DASH Score 36.36 %             [facility] Adult PT Treatment/Exercise - 09/16/15 0001    Shoulder Exercises: Supine   Protraction AROM;10 reps   Other Supine Exercises dowel rod exercise for shoulder flexion  pt reported improvement with repetition                 PT Education - 09/16/15 1309    Education provided Yes   Education Details supine shoulder dowel rod flexion and right shoulder protraction ,  encouraged pt to schedule for ABC class on June 5   Person(s) Educated Patient   Methods Explanation;Demonstration;Handout   Comprehension Verbalized understanding;Returned demonstration                [facility] Goals - 09/16/15 1316    CC Long Term Goal  #1   Title Patient will be independent in a home exercise program   Time 4   Period Weeks    Status New   CC Long Term Goal  #2   Title Patient will report a decrease in pain by 50% so they can perform daily activities with greater ease   Time 4   Period Weeks   Status New   CC Long Term Goal  #3   Title Patient will decrease the DASH score to <  20  to demonstrate increased functional use of upper extremity   Time 4   Period Weeks   Status New            Plan - 09/16/15 1310    Clinical Impression Statement Pt reports shoulder pain and stiffness and wants to learn what to do. She has limited money for co-pays and wants to know what to do a [employer]    Rehab Potential Excellent   Clinical Impairments Affecting Rehab Potential previous chemo and radiation, history of falls , upcoming surgery on right thumb   PT Frequency 1x / week   PT Duration 4 weeks   PT Treatment/Interventions ADLs/Self Care Home Management;Passive range of motion;Patient/family education;Therapeutic exercise;Manual techniques;Therapeutic activities   PT Next Visit Plan meeks decompression exercise, supine scapular series, HEP for sretching,( wall washing, doorway stretch)    Recommended Other Services ABAC class   Consulted and Agree with Plan of Care Patient      Patient will benefit from skilled therapeutic intervention in order to improve the following deficits and impairments:  Decreased strength, Impaired  UE functional use, Decreased range of motion  Visit Diagnosis: Pain in right shoulder - Plan: PT plan of care cert/re-cert  Stiffness of right shoulder, not elsewhere classified - Plan: PT plan of care cert/re-cert      G-Codes - [id number]    Functional Assessment Tool Used Quick DASH   Functional Limitation Carrying, moving and handling objects   Carrying, Moving and Handling Objects Current Status [id number]) At least 20 percent but less than 40 percent impaired, limited or restricted   Carrying, Moving and Handling Objects Goal Status [id number]) At least 1 percent but less than 20  percent impaired, limited or restricted       Problem List Patient Active Problem List   Diagnosis Date Noted  . Ataxia 09/15/2015  . Neutropenic fever ([place]) 12/24/2014  . Dehydration 12/24/2014  . Neutropenia with fever ([place]) 12/23/2014  . Anemia due to chemotherapy 12/16/2014  . Neutropenia ([place]) 10/21/2014  . UTI (lower urinary tract infection) 10/21/2014  . Hypokalemia 10/21/2014  . Fall 10/16/2014  . Breast cancer of upper-outer quadrant of right female breast ([place]) 09/09/2014  . Lumbar spinal stenosis 07/31/2014  . Shingles 09/23/2013  . Chest pressure 09/20/2013  . Hypotension due to drugs 09/20/2013  . GERD (gastroesophageal reflux disease)   . Depression   . Hypercholesteremia   . Arthritis   . Abdominal pain, unspecified site 10/12/2012  . Atrial fibrillation ([place]) 08/12/2012  . Hypertension   . Sliding hiatal hernia s/p lap PEH repair 07/31/2012  . Other and unspecified angina pectoris 01/23/2012    Donato Heinz. Owens Shark, PT   09/16/2015, 1:20 PM  [place], [place], [address] Phone: [phone]   Fax:  [phone]  Name: Angela Bond MRN: [id number] Date of Birth: Mar 05, 1946

## 2015-09-16 NOTE — Patient Instructions (Addendum)
  Cane Overhead - Supine  Hold cane at thighs with both hands, extend arms straight over head. Repeat _10 __ times. Do _2_ times per day.   Lie on back, Reach hand toward ceiling, keep elbow straight the whole time.  Reach  Up to ceiling  And then bring shoulder back toward bed

## 2015-09-21 ENCOUNTER — Ambulatory Visit: Payer: Medicare Other

## 2015-09-21 ENCOUNTER — Telehealth: Payer: Self-pay

## 2015-09-21 NOTE — Telephone Encounter (Signed)
I called pt as she hadn't arrived for her 0800 appt and left a message. Pt then called back a few minutes later reporting she had a virus and couldn't come today and is also having surgery later this week so plans on keeping her next scheduled visit which is on 10/06/15.

## 2015-09-24 ENCOUNTER — Ambulatory Visit (HOSPITAL_COMMUNITY): Admission: RE | Admit: 2015-09-24 | Payer: Medicare Other | Source: Ambulatory Visit

## 2015-10-06 ENCOUNTER — Ambulatory Visit: Payer: Medicare Other | Admitting: Physical Therapy

## 2015-10-07 ENCOUNTER — Encounter: Payer: Self-pay | Admitting: Physical Therapy

## 2015-10-07 ENCOUNTER — Ambulatory Visit: Payer: Medicare Other | Admitting: Physical Therapy

## 2015-10-07 DIAGNOSIS — M25611 Stiffness of right shoulder, not elsewhere classified: Secondary | ICD-10-CM

## 2015-10-07 DIAGNOSIS — M25511 Pain in right shoulder: Secondary | ICD-10-CM

## 2015-10-07 NOTE — Therapy (Signed)
[place], [place], [address] Phone: [phone]   Fax:  [phone]  Physical Therapy Treatment  Patient Details  Name: Angela Bond MRN: [id number] Date of Birth: 30-May-1945 Referring Provider: Dr. Lindi Adie   Encounter Date: 10/07/2015      PT End of Session - 10/07/15 1657    Visit Number 2   Number of Visits 5   Date for PT Re-Evaluation 10/21/15   PT Start Time 1430   PT Stop Time 1515   PT Time Calculation (min) 45 min   Activity Tolerance Patient tolerated treatment well   Behavior During Therapy [facility] for tasks assessed/performed      Past Medical History  Diagnosis Date  . Arthritis   . Hypertension   . GERD (gastroesophageal reflux disease)   . Hypercholesteremia   . Heart murmur   . Anxiety and depression   . H/O hiatal hernia   . Headache(784.0)     sinus  . Dysrhythmia   . Shortness of breath     "stomach is in lungs" when had hernia 2014 none now(07/28/14)  . Pneumonia     hx  . Depression   . Anxiety   . Anemia     hx  . Shingles 5/15    hx  . Breast cancer ([place]) 3/16    Past Surgical History  Procedure Laterality Date  . Total knee arthroplasty Bilateral   . Esophageal manometry N/A 07/02/2012    Procedure: ESOPHAGEAL MANOMETRY (EM);  Surgeon: Lear Ng, MD;  Location: [facility];  Service: Endoscopy;  Laterality: N/A;  schooler to read  . Tonsillectomy  as child  . Hiatal hernia repair N/A 08/10/2012    Procedure: LAPAROSCOPIC REPAIR OF HIATAL HERNIA WITH MESH AND EGD WITH PEG TUBE PLACEMENT;  Surgeon: Ralene Ok, MD;  Location: [facility];  Service: General;  Laterality: N/A;  . Breast surgery      r breast biopsy  . Joint replacement Bilateral 2011,2012    knees  . Eye surgery  2013    tube placed Left ,tear duct  . Breast lumpectomy with sentinel lymph node biopsy Right 09/22/2014    Procedure: RIGHT BREAST LUMPECTOMY WITH NEEDLE LOCALIZATION AND  RIGHT AXILLARY SENTINEL LYMPH NODE BIOPSY;  Surgeon: [name], MD;  Location: [place];  Service: General;  Laterality: Right;  . Portacath placement Left 09/22/2014    Procedure: INSERTION PORT-A-CATH;  Surgeon: [name], MD;  Location: [place];  Service: General;  Laterality: Left;    There were no vitals filed for this visit.      Subjective Assessment - 10/07/15 1431    Subjective Pt had surgery on her right thumb and had to have a bone removed. She had stitches removed yesterday. She is currently wearing a brace on that hand and thumb and has a follow up in three weeks. She has been doing her exercises given to her at PT eval and states they are going well and are helping decrease pain.    Pertinent History right breast cancer with lumpectomy with 2 lymph nodes removed 09/22/2014, chemotherapy 10/15/2014-12/16/2014, radiation 9/12-10/26/2016.  Pt has also been having problems with ataxia, dizziness and falls and will have MRI.  She is referred to this episode of PT to work on improving shoulder range of motion, she has a compression bra on order  Pt is having surgery on right  thumb on May 10    Patient Stated Goals to  be able to use right arm more. to learn about the exercises and then do the Silver [employer]neakers program at [employer]    Currently in Pain? Yes   Pain Score 5    Pain Location Wrist   Pain Orientation Right   Pain Descriptors / Indicators Throbbing   Pain Type Acute pain                         [facility] Adult PT Treatment/Exercise - 10/07/15 0001    Shoulder Exercises: Supine   Protraction AROM;10 reps   Horizontal ABduction Strengthening;Both;10 reps;Theraband   Theraband Level (Shoulder Horizontal ABduction) Level 1 (Yellow)   External Rotation Strengthening;Both;10 reps;Theraband   Theraband Level (Shoulder External Rotation) Level 1 (Yellow)   Flexion AAROM;10 reps;Both;Other (comment)  with dowel, pt instructed to hold for 30 secs   ABduction  AROM;Right;10 reps  in supine without dowel bc it increases wrist pain   Other Supine Exercises see both: meeks decompression exercises, flexion with yellow theraband narrow then wide grip x 10 reps   Other Supine Exercises D2 with yellow theraband x 10 reps bilaterally                PT Education - 10/07/15 1656    Education provided Yes   Education Details shoulder scap exercises, decompression exercises, supine shoulder abduction                 [facility] Goals - 10/07/15 1437    CC Long Term Goal  #1   Title Patient will be independent in a home exercise program   Time 4   Period Weeks   Status On-going   CC Long Term Goal  #2   Title Patient will report a decrease in pain by 50% so they can perform daily activities with greater ease   Baseline 10/07/15- 40% improvement   Time 4   Period Weeks   Status On-going   CC Long Term Goal  #3   Title Patient will decrease the DASH score to <  20  to demonstrate increased functional use of upper extremity   Time 4   Period Weeks   Status On-going            Plan - 10/07/15 1657    Clinical Impression Statement Pt hasbeen performing her exercises at home since her evaluation. She is making progress towards goals in therapy. New exercises were added today and pt did excellent with all exercises. She stated they helped her. Pt was given decompression exercises, shoulder stabilization exercises and supine shoulder AROM for her home exercise program.    Rehab Potential Excellent   Clinical Impairments Affecting Rehab Potential previous chemo and radiation, history of falls , upcoming surgery on right thumb   PT Duration 4 weeks   PT Treatment/Interventions ADLs/Self Care Home Management;Passive range of motion;Patient/family education;Therapeutic exercise;Manual techniques;Therapeutic activities   PT Next Visit Plan assess indep with meeks decompression exercise, supine scapular series, add HEP for sretching,(  wall washing, doorway stretch)    Consulted and Agree with Plan of Care Patient      Patient will benefit from skilled therapeutic intervention in order to improve the following deficits and impairments:  Decreased strength, Impaired UE functional use, Decreased range of motion  Visit Diagnosis: Pain in right shoulder  Stiffness of right shoulder, not elsewhere classified     Problem List Patient Active Problem List   Diagnosis Date Noted  .  Ataxia 09/15/2015  . Neutropenic fever ([place]) 12/24/2014  . Dehydration 12/24/2014  . Neutropenia with fever ([place]) 12/23/2014  . Anemia due to chemotherapy 12/16/2014  . Neutropenia ([place]) 10/21/2014  . UTI (lower urinary tract infection) 10/21/2014  . Hypokalemia 10/21/2014  . Fall 10/16/2014  . Breast cancer of upper-outer quadrant of right female breast ([place]) 09/09/2014  . Lumbar spinal stenosis 07/31/2014  . Shingles 09/23/2013  . Chest pressure 09/20/2013  . Hypotension due to drugs 09/20/2013  . GERD (gastroesophageal reflux disease)   . Depression   . Hypercholesteremia   . Arthritis   . Abdominal pain, unspecified site 10/12/2012  . Atrial fibrillation ([place]) 08/12/2012  . Hypertension   . Sliding hiatal hernia s/p lap PEH repair 07/31/2012  . Other and unspecified angina pectoris 01/23/2012    Alexia Freestone 10/07/2015, 5:00 PM  [place], [place], [address] Phone: [phone]   Fax:  [phone]  Name: ALAZIAH PRANTE MRN: [id number] Date of Birth: 12-Dec-1945    Allyson Sabal, PT 10/07/2015 5:01 PM

## 2015-10-07 NOTE — Patient Instructions (Addendum)
Shoulder: Abduction (Supine)    With right arm flat on floor.  Slowly move arm up to side of head. Do not let elbow bend. Hold _30___ seconds. Repeat _10___ times. Do __2__ sessions per day. CAUTION: Stretch slowly and gently. Do not use dowel.   Copyright  VHI. All rights reserved.   Over Head Pull: Narrow Grip       On back, knees bent, feet flat, band across thighs, elbows straight but relaxed. Pull hands apart (start). Keeping elbows straight, bring arms up and over head, hands toward floor. Keep pull steady on band. Hold momentarily. Return slowly, keeping pull steady, back to start. Repeat _10__ times. Band color ___yellow___   Side Pull: Double Arm   On back, knees bent, feet flat. Arms perpendicular to body, shoulder level, elbows straight but relaxed. Pull arms out to sides, elbows straight. Resistance band comes across collarbones, hands toward floor. Hold momentarily. Slowly return to starting position. Repeat _10__ times. Band color __yellow___   Sash   On back, knees bent, feet flat, left hand on left hip, right hand above left. Pull right arm DIAGONALLY (hip to shoulder) across chest. Bring right arm along head toward floor. Hold momentarily. Slowly return to starting position. Repeat 10___ times. Do with left arm. Band color _yellow_____   Shoulder Rotation: Double Arm   On back, knees bent, feet flat, elbows tucked at sides, bent 90, hands palms up. Pull hands apart and down toward floor, keeping elbows near sides. Hold momentarily. Slowly return to starting position. Repeat _10__ times. Band color _yellow_____    Decompression Exercise: Basic   Lie on back on firm surface, knees bent, feet flat, arms turned up, out to sides, backs of hands down. Time 3-5___ minutes. Surface: floor   Copyright  VHI. All rights reserved.  Shoulder Press   Press both shoulders down. Hold _3-5__ seconds. Repeat _5-10__ times. Surface: floor   Copyright  VHI. All  rights reserved.  Head Press With [place] chin SLIGHTLY toward chest, keep mouth closed. Feel weight on back of head. Increase weight by pressing head down. Hold _3-5_ seconds. Relax. Repeat _5-10__ times. Surface: floor   Copyright  VHI. All rights reserved.

## 2015-10-14 ENCOUNTER — Ambulatory Visit: Payer: Medicare Other

## 2015-10-14 DIAGNOSIS — M25511 Pain in right shoulder: Secondary | ICD-10-CM

## 2015-10-14 DIAGNOSIS — M25611 Stiffness of right shoulder, not elsewhere classified: Secondary | ICD-10-CM

## 2015-10-14 NOTE — Therapy (Signed)
[place], [place], [address] Phone: [phone]   Fax:  [phone]  Physical Therapy Treatment  Patient Details  Name: Angela Bond MRN: [id number] Date of Birth: 05-07-1946 Referring Provider: Dr. Lindi Adie   Encounter Date: 10/14/2015      PT End of Session - 10/14/15 1107    Visit Number 3   Number of Visits 5   Date for PT Re-Evaluation 10/21/15   PT Start Time 1016   PT Stop Time 1103   PT Time Calculation (min) 47 min   Activity Tolerance Patient tolerated treatment well   Behavior During Therapy [facility] for tasks assessed/performed      Past Medical History  Diagnosis Date  . Arthritis   . Hypertension   . GERD (gastroesophageal reflux disease)   . Hypercholesteremia   . Heart murmur   . Anxiety and depression   . H/O hiatal hernia   . Headache(784.0)     sinus  . Dysrhythmia   . Shortness of breath     "stomach is in lungs" when had hernia 2014 none now(07/28/14)  . Pneumonia     hx  . Depression   . Anxiety   . Anemia     hx  . Shingles 5/15    hx  . Breast cancer ([place]) 3/16    Past Surgical History  Procedure Laterality Date  . Total knee arthroplasty Bilateral   . Esophageal manometry N/A 07/02/2012    Procedure: ESOPHAGEAL MANOMETRY (EM);  Surgeon: Lear Ng, MD;  [facility];  Service: Endoscopy;  Laterality: N/A;  schooler to read  . Tonsillectomy  as child  . Hiatal hernia repair N/A 08/10/2012    Procedure: LAPAROSCOPIC REPAIR OF HIATAL HERNIA WITH MESH AND EGD WITH PEG TUBE PLACEMENT;  Surgeon: Ralene Ok, MD;  [facility];  Service: General;  Laterality: N/A;  . Breast surgery      r breast biopsy  . Joint replacement Bilateral 2011,2012    knees  . Eye surgery  2013    tube placed Left ,tear duct  . Breast lumpectomy with sentinel lymph node biopsy Right 09/22/2014    Procedure: RIGHT BREAST LUMPECTOMY WITH NEEDLE LOCALIZATION AND  RIGHT AXILLARY SENTINEL LYMPH NODE BIOPSY;  Surgeon: [name], MD;  Location: [facility];  Service: General;  Laterality: Right;  . Portacath placement Left 09/22/2014    Procedure: INSERTION PORT-A-CATH;  Surgeon: [name], MD;  Location: [place];  Service: General;  Laterality: Left;    There were no vitals filed for this visit.      Subjective Assessment - 10/14/15 1017    Subjective My stitches from surgery came out last week and it's slowly getting better. My Rt wrist doesn/t really hurt anymore, just feels sore.  Been doing my HEP and my A/ROM is getting better as well. My chest tightness has improved at least 50%.   Pertinent History right breast cancer with lumpectomy with 2 lymph nodes removed 09/22/2014, chemotherapy 10/15/2014-12/16/2014, radiation 9/12-10/26/2016.  Pt has also been having problems with ataxia, dizziness and falls and will have MRI.  She is referred to this episode of PT to work on improving shoulder range of motion, she has a compression bra on order  Pt is having surgery on right  thumb on May 10    Patient Stated Goals to be able to use right arm more. to learn about the exercises and then do the Silver [employer]neakers  program at [employer]    Currently in Pain? No/denies            [facility] PT Assessment - 10/14/15 0001    AROM   Right Shoulder Flexion 146 Degrees   Right Shoulder ABduction 127 Degrees   Right Shoulder External Rotation 77 Degrees                     [facility] Adult PT Treatment/Exercise - 10/14/15 0001    Shoulder Exercises: Supine   Other Supine Exercises Reviewed all supine scapular series exercises with yellow theraband x5-7 each per last visit and pt did very well with these.   Shoulder Exercises: Standing   Flexion Strengthening;Both;10 reps;Weights  Standing with back against wall and core engaged    Shoulder Flexion Weight (lbs) 2   Flexion Limitations Just to shoulder height   ABduction Strengthening;Both;10 reps;Weights   Standing with back against wall and core engaged   Shoulder ABduction Weight (lbs) 2   ABduction Limitations Just to shoulder height   Other Standing Exercises Bil scaption to shoulder height with 2 lbs, standing against wall with core engaged   Shoulder Exercises: Pulleys   Flexion 2 minutes   ABduction 2 minutes   Shoulder Exercises: Therapy Ball   Flexion 10 reps  Forward lean at top of stretch   Shoulder Exercises: ROM/Strengthening   Other ROM/Strengthening Exercises Finger Ladder for Rt UE abduction x5 up to #22 (pt reported this felt good to move her fingers as well.    Shoulder Exercises: Stretch   Corner Stretch 5 reps;10 seconds  In doorway   Manual Therapy   Passive ROM To Rt shoulder briefly at end of session for end range stretching for Rt shouler into flexion, abduction and D2                PT Education - 10/14/15 1106    Education provided Yes   Education Details Standing 3 way raises and doorway stretch   Person(s) Educated Patient   Methods Explanation;Demonstration;Handout   Comprehension Verbalized understanding;Returned demonstration;Need further instruction                [facility] Goals - 10/14/15 1155    CC Long Term Goal  #1   Title Patient will be independent in a home exercise program   Status Partially Met   CC Long Term Goal  #2   Title Patient will report a decrease in pain by 50% so they can perform daily activities with greater ease   Baseline 10/07/15- 40% improvement, at least 50% reported 10/14/15   Status Achieved   CC Long Term Goal  #3   Title Patient will decrease the DASH score to <  20  to demonstrate increased functional use of upper extremity   Status On-going            Plan - 10/14/15 1107    Clinical Impression Statement Pt continues to make great gains with her progress and towards her goals. She hsa been compliant with her HEP thus far and so progressed this today to include standing 3 way raises  with 2 lbs (she reports not having any limitations from wrist surgery and had no pain). She did well with review of her current HEP as well. Did not progress this since we added standing exericses instead.    Rehab Potential Excellent   Clinical Impairments Affecting Rehab Potential previous chemo and radiation, history of falls , upcoming surgery  on right thumb   PT Frequency 1x / week   PT Duration 4 weeks   PT Treatment/Interventions ADLs/Self Care Home Management;Passive range of motion;Patient/family education;Therapeutic exercise;Manual techniques;Therapeutic activities   PT Next Visit Plan Retake DASH for goal assess. Review new HEP, issue red theraband for current HEP and progress prn. Plan to D/C next visit as pt is doing so well with progress.    PT Home Exercise Plan see education section   Consulted and Agree with Plan of Care Patient      Patient will benefit from skilled therapeutic intervention in order to improve the following deficits and impairments:  Decreased strength, Impaired UE functional use, Decreased range of motion  Visit Diagnosis: Pain in right shoulder  Stiffness of right shoulder, not elsewhere classified     Problem List Patient Active Problem List   Diagnosis Date Noted  . Ataxia 09/15/2015  . Neutropenic fever ([place]) 12/24/2014  . Dehydration 12/24/2014  . Neutropenia with fever ([place]) 12/23/2014  . Anemia due to chemotherapy 12/16/2014  . Neutropenia ([place]) 10/21/2014  . UTI (lower urinary tract infection) 10/21/2014  . Hypokalemia 10/21/2014  . Fall 10/16/2014  . Breast cancer of upper-outer quadrant of right female breast ([place]) 09/09/2014  . Lumbar spinal stenosis 07/31/2014  . Shingles 09/23/2013  . Chest pressure 09/20/2013  . Hypotension due to drugs 09/20/2013  . GERD (gastroesophageal reflux disease)   . Depression   . Hypercholesteremia   . Arthritis   . Abdominal pain, unspecified site 10/12/2012  . Atrial fibrillation ([place])  08/12/2012  . Hypertension   . Sliding hiatal hernia s/p lap PEH repair 07/31/2012  . Other and unspecified angina pectoris 01/23/2012    Otelia Limes, PTA 10/14/2015, 11:57 AM  [place], [place], [address] Phone: [phone]   Fax:  [phone]  Name: KYNZLEIGH BANDEL MRN: [id number] Date of Birth: 06/16/1945

## 2015-10-14 NOTE — Patient Instructions (Addendum)
Standing 3 way raises Stand with back against wall and tuck hips underneath you by tightening tummy and have head/shoulders against wall, feet a little away from wall: 1. Lift weight (start with 2 lbs) in front to shoulder height 2. Then a little wider into a  "V" to shoulder height 3. Then all the way out to the side in a "T" to shoulder height  Start with 10 reps each, then when stronger increase to 2 and then 3 sets of each. When able to do those, increase your weights to 3 lbs but then scale back your reps to 1 set of 10 reps and start from there.   Should not have any pain with these, try to keep chin tucked and not looking up at ceiling.  CHEST: Doorway, Bilateral - Standing    Standing in doorway WITH ONE FOOT IN FRONT OF OTHER, place hands on wall with elbows bent at shoulder height. Lean forward. Hold _10__ seconds. _5__ reps per set, _2__ sets per day.  Copyright  VHI. All rights reserved.    Cancer Rehab [phone]

## 2015-10-21 ENCOUNTER — Ambulatory Visit: Payer: Medicare Other | Attending: Hematology and Oncology

## 2015-10-21 DIAGNOSIS — M25611 Stiffness of right shoulder, not elsewhere classified: Secondary | ICD-10-CM | POA: Insufficient documentation

## 2015-10-21 DIAGNOSIS — M25511 Pain in right shoulder: Secondary | ICD-10-CM | POA: Diagnosis not present

## 2015-10-21 NOTE — Therapy (Addendum)
[place], [place], [address] Phone: [phone]   Fax:  [phone]  Physical Therapy Treatment  Patient Details  Name: Angela Bond MRN: [id number] Date of Birth: March 04, 1946 Referring Provider: Dr. Lindi Adie   Encounter Date: 10/21/2015      PT End of Session - 10/21/15 1101    Visit Number 4   Number of Visits 5   Date for PT Re-Evaluation 10/21/15   PT Start Time 1022   PT Stop Time 1101   PT Time Calculation (min) 39 min   Activity Tolerance Patient tolerated treatment well   Behavior During Therapy [facility] for tasks assessed/performed      Past Medical History  Diagnosis Date  . Arthritis   . Hypertension   . GERD (gastroesophageal reflux disease)   . Hypercholesteremia   . Heart murmur   . Anxiety and depression   . H/O hiatal hernia   . Headache(784.0)     sinus  . Dysrhythmia   . Shortness of breath     "stomach is in lungs" when had hernia 2014 none now(07/28/14)  . Pneumonia     hx  . Depression   . Anxiety   . Anemia     hx  . Shingles 5/15    hx  . Breast cancer ([place]) 3/16    Past Surgical History  Procedure Laterality Date  . Total knee arthroplasty Bilateral   . Esophageal manometry N/A 07/02/2012    Procedure: ESOPHAGEAL MANOMETRY (EM);  Surgeon: Lear Ng, MD;  Location: [facility];  Service: Endoscopy;  Laterality: N/A;  schooler to read  . Tonsillectomy  as child  . Hiatal hernia repair N/A 08/10/2012    Procedure: LAPAROSCOPIC REPAIR OF HIATAL HERNIA WITH MESH AND EGD WITH PEG TUBE PLACEMENT;  Surgeon: Ralene Ok, MD;  Location: [facility];  Service: General;  Laterality: N/A;  . Breast surgery      r breast biopsy  . Joint replacement Bilateral 2011,2012    knees  . Eye surgery  2013    tube placed Left ,tear duct  . Breast lumpectomy with sentinel lymph node biopsy Right 09/22/2014    Procedure: RIGHT BREAST LUMPECTOMY WITH NEEDLE LOCALIZATION AND  RIGHT AXILLARY SENTINEL LYMPH NODE BIOPSY;  Surgeon: [name], MD;  Location: [place];  Service: General;  Laterality: Right;  . Portacath placement Left 09/22/2014    Procedure: INSERTION PORT-A-CATH;  Surgeon: [name], MD;  Location: [place];  Service: General;  Laterality: Left;    There were no vitals filed for this visit.      Subjective Assessment - 10/21/15 1024    Subjective I've been doing well. Been doing my exercises at home and I think my Rt shoulder has continued to get better. My Rt thumb/wrist is healing well, it just feels really tight. I got back to see that doctor next Tuesday. I saw my surgeon since my last appt and he said my Rt shoulder looked great and don't have to see him for 6 months.    Pertinent History right breast cancer with lumpectomy with 2 lymph nodes removed 09/22/2014, chemotherapy 10/15/2014-12/16/2014, radiation 9/12-10/26/2016.  Pt has also been having problems with ataxia, dizziness and falls and will have MRI.  She is referred to this episode of PT to work on improving shoulder range of motion, she has a compression bra on order  Pt is having surgery on right  thumb on May 10  Patient Stated Goals to be able to use right arm more. to learn about the exercises and then do the Silver Sneakers program at [employer]    Currently in Pain? No/denies            [facility] PT Assessment - 10/21/15 0001    AROM   Right Shoulder Flexion 156 Degrees   Right Shoulder ABduction 147 Degrees   Right Shoulder Internal Rotation 81 Degrees   Right Shoulder External Rotation 82 Degrees              Quick Dash - 10/21/15 0001    Open a tight or new jar No difficulty   Do heavy household chores (wash walls, wash floors) No difficulty   Carry a shopping bag or briefcase No difficulty   Wash your back No difficulty   Use a knife to cut food No difficulty   Recreational activities in which you take some force or impact through your arm, shoulder, or hand  (golf, hammering, tennis) No difficulty   During the past week, to what extent has your arm, shoulder or hand problem interfered with your normal social activities with family, friends, neighbors, or groups? Not at all   During the past week, to what extent has your arm, shoulder or hand problem limited your work or other regular daily activities Not at all   Arm, shoulder, or hand pain. None   Tingling (pins and needles) in your arm, shoulder, or hand None   Difficulty Sleeping No difficulty   DASH Score 0 %               [facility] Adult PT Treatment/Exercise - 10/21/15 0001    Shoulder Exercises: Standing   Flexion Strengthening;Both;10 reps;Weights  Standing with back against wall/core engaged   Shoulder Flexion Weight (lbs) 2   Flexion Limitations Just to shoulder height   ABduction Strengthening;Both;10 reps;Weights  Standing with back against wall/core engaged   Shoulder ABduction Weight (lbs) 2   ABduction Limitations Just to shoulder height   Other Standing Exercises Bil scaption to shoulder height with 2 lbs, standing against wall with core engaged   Shoulder Exercises: Pulleys   Flexion 2 minutes   ABduction 2 minutes   Shoulder Exercises: Therapy Ball   Flexion 10 reps  Forward lean at top of stretch   Shoulder Exercises: ROM/Strengthening   Other ROM/Strengthening Exercises Finger Ladder for Rt UE abduction x8 up to #23 (pts full ROM)                PT Education - 10/21/15 1100    Education provided Yes   Education Details [place] with red theraband for Rt UE   Person(s) Educated Patient   Methods Explanation;Demonstration;Handout;Verbal cues   Comprehension Verbalized understanding;Returned demonstration                Angela Bond Goals - 10/21/15 1037    CC Long Term Goal  #1   Title Patient will be independent in a home exercise program   Status Achieved   CC Long Term Goal  #2   Title Patient will report a decrease in pain by 50%  so they can perform daily activities with greater ease   Baseline 10/07/15- 40% improvement, at least 50% reported 10/14/15, 75% improvement reported 10/21/15   Status Achieved   CC Long Term Goal  #3   Title Patient will decrease the DASH score to <  20  to demonstrate increased functional use of  upper extremity  Pt scored 0% limited!   Status Achieved            Plan - 10/21/15 1102    Clinical Impression Statement Pt has met all her goals and feels very pleased with her progress and has been compliant with her HEP and plans to cont this. Angela Bond is ready for D/C at this time.    Rehab Potential Excellent   Clinical Impairments Affecting Rehab Potential previous chemo and radiation, history of falls , upcoming surgery on right thumb   PT Frequency 1x / week   PT Duration 4 weeks   PT Treatment/Interventions ADLs/Self Care Home Management;Passive range of motion;Patient/family education;Therapeutic exercise;Manual techniques;Therapeutic activities   PT Next Visit Plan D/C this visit.    PT Home Exercise Plan Cont supine scap series, begin [place] issued today and cont end ROM stretching so as not lose any progress.    Consulted and Agree with Plan of Care Patient      Patient will benefit from skilled therapeutic intervention in order to improve the following deficits and impairments:  Decreased strength, Impaired UE functional use, Decreased range of motion  Visit Diagnosis: Pain in right shoulder  Stiffness of right shoulder, not elsewhere classified     Problem List Patient Active Problem List   Diagnosis Date Noted  . Ataxia 09/15/2015  . Neutropenic fever ([place]) 12/24/2014  . Dehydration 12/24/2014  . Neutropenia with fever ([place]) 12/23/2014  . Anemia due to chemotherapy 12/16/2014  . Neutropenia ([place]) 10/21/2014  . UTI (lower urinary tract infection) 10/21/2014  . Hypokalemia 10/21/2014  . Fall 10/16/2014  . Breast cancer of upper-outer quadrant of right female  breast ([place]) 09/09/2014  . Lumbar spinal stenosis 07/31/2014  . Shingles 09/23/2013  . Chest pressure 09/20/2013  . Hypotension due to drugs 09/20/2013  . GERD (gastroesophageal reflux disease)   . Depression   . Hypercholesteremia   . Arthritis   . Abdominal pain, unspecified site 10/12/2012  . Atrial fibrillation ([place]) 08/12/2012  . Hypertension   . Sliding hiatal hernia s/p lap PEH repair 07/31/2012  . Other and unspecified angina pectoris 01/23/2012    Angela Bond, Angela Bond 10/21/2015, 11:05 AM  [place], [place], [address] Phone: [phone]   Fax:  [phone]  Name: Angela Bond MRN: [id number] Date of Birth: [birth year]    PHYSICAL THERAPY DISCHARGE SUMMARY  Visits from Start of Care: 3 Current functional level related to goals / functional outcomes: Pt is independent with a home exercise program and has significantly decreased pain. She scored a 0% impairment on the DASH.   Remaining deficits: none   Education / Equipment: HEP Plan: Patient agrees to discharge.  Patient goals were met. Patient is being discharged due to meeting the stated rehab goals.  ?????       Angela Bond, PT 10/21/2015 12:42 PM

## 2015-10-21 NOTE — Patient Instructions (Addendum)
Cancer Rehab [phone]  1. Strengthening: Resisted Flexion   Hold tubing with left arm at side. Pull forward and up. Move shoulder through pain-free range of motion. Repeat __10__ times per set. Do _1-2___ sets per session. Do _1-2___ sessions per day.  2. Strengthening: Resisted Internal Rotation   Hold tubing in left hand, elbow at side and forearm out. Rotate forearm in across body. Repeat __10__ times per set. Do _1-2___ sets per session. Do _1-2___ sessions per day.   3. Strengthening: Resisted Extension   Hold tubing in right hand, arm forward. Pull arm back, elbow straight. Repeat _10___ times per set. Do _1-2___ sets per session. Do __1-2__ sessions per day.  4. Strengthening: Resisted External Rotation   Hold tubing in right hand, elbow at side and forearm across body. Rotate forearm out. Repeat __10__ times per set. Do _1-2___ sets per session. Do _1-2___ sessions per day.  Get a ball (maybe a beach ball from the $ store) to roll up the wall in front and to the side. 10 times, hold 5 seconds

## 2015-11-05 ENCOUNTER — Encounter (HOSPITAL_COMMUNITY)
Admission: EM | Disposition: A | Discharge status: Home / Self Care | Payer: Self-pay | Source: Home / Self Care | Attending: Cardiology

## 2015-11-05 ENCOUNTER — Emergency Department (HOSPITAL_COMMUNITY): Payer: Medicare Other

## 2015-11-05 ENCOUNTER — Other Ambulatory Visit: Payer: Self-pay

## 2015-11-05 ENCOUNTER — Encounter (HOSPITAL_COMMUNITY): Payer: Self-pay | Admitting: Nurse Practitioner

## 2015-11-05 ENCOUNTER — Observation Stay (HOSPITAL_COMMUNITY)
Admission: EM | Admit: 2015-11-05 | Discharge: 2015-11-06 | Disposition: A | Discharge status: Home / Self Care | Payer: Medicare Other | Attending: Cardiology | Admitting: Cardiology

## 2015-11-05 DIAGNOSIS — I2102 ST elevation (STEMI) myocardial infarction involving left anterior descending coronary artery: Secondary | ICD-10-CM

## 2015-11-05 DIAGNOSIS — E7800 Pure hypercholesterolemia, unspecified: Secondary | ICD-10-CM | POA: Diagnosis not present

## 2015-11-05 DIAGNOSIS — Z853 Personal history of malignant neoplasm of breast: Secondary | ICD-10-CM | POA: Diagnosis not present

## 2015-11-05 DIAGNOSIS — I2510 Atherosclerotic heart disease of native coronary artery without angina pectoris: Secondary | ICD-10-CM | POA: Diagnosis not present

## 2015-11-05 DIAGNOSIS — R0789 Other chest pain: Secondary | ICD-10-CM | POA: Diagnosis not present

## 2015-11-05 DIAGNOSIS — E785 Hyperlipidemia, unspecified: Secondary | ICD-10-CM | POA: Insufficient documentation

## 2015-11-05 DIAGNOSIS — I214 Non-ST elevation (NSTEMI) myocardial infarction: Secondary | ICD-10-CM | POA: Diagnosis present

## 2015-11-05 DIAGNOSIS — Z96653 Presence of artificial knee joint, bilateral: Secondary | ICD-10-CM | POA: Diagnosis not present

## 2015-11-05 DIAGNOSIS — I480 Paroxysmal atrial fibrillation: Secondary | ICD-10-CM | POA: Diagnosis not present

## 2015-11-05 DIAGNOSIS — I10 Essential (primary) hypertension: Secondary | ICD-10-CM | POA: Diagnosis present

## 2015-11-05 DIAGNOSIS — Z955 Presence of coronary angioplasty implant and graft: Secondary | ICD-10-CM

## 2015-11-05 DIAGNOSIS — Z9221 Personal history of antineoplastic chemotherapy: Secondary | ICD-10-CM | POA: Diagnosis not present

## 2015-11-05 DIAGNOSIS — Z923 Personal history of irradiation: Secondary | ICD-10-CM | POA: Diagnosis not present

## 2015-11-05 DIAGNOSIS — I249 Acute ischemic heart disease, unspecified: Secondary | ICD-10-CM | POA: Diagnosis not present

## 2015-11-05 DIAGNOSIS — R079 Chest pain, unspecified: Secondary | ICD-10-CM

## 2015-11-05 HISTORY — PX: CORONARY ANGIOPLASTY WITH STENT PLACEMENT: SHX49

## 2015-11-05 HISTORY — DX: Personal history of other medical treatment: Z92.89

## 2015-11-05 HISTORY — PX: CARDIAC CATHETERIZATION: SHX172

## 2015-11-05 HISTORY — DX: Malignant neoplasm of unspecified site of right female breast: C50.911

## 2015-11-05 HISTORY — DX: Atherosclerotic heart disease of native coronary artery without angina pectoris: I25.10

## 2015-11-05 LAB — HEPATIC FUNCTION PANEL
ALT: 14 U/L (ref 14–54)
AST: 25 U/L (ref 15–41)
Albumin: 3.7 g/dL (ref 3.5–5.0)
Alkaline Phosphatase: 78 U/L (ref 38–126)
BILIRUBIN TOTAL: 0.6 mg/dL (ref 0.3–1.2)
TOTAL PROTEIN: 7.8 g/dL (ref 6.5–8.1)

## 2015-11-05 LAB — CREATININE, SERUM
CREATININE: 0.98 mg/dL (ref 0.44–1.00)
GFR calc Af Amer: >60 mL/min (ref >60)
GFR calc non Af Amer: 57 mL/min — ABNORMAL LOW (ref >60)

## 2015-11-05 LAB — CBC
HCT: 35 % — ABNORMAL LOW (ref 36.0–46.0)
HEMATOCRIT: 38.8 % (ref 36.0–46.0)
Hemoglobin: 12.5 g/dL (ref 12.0–15.0)
Hemoglobin: 11.2 g/dL — ABNORMAL LOW (ref 12.0–15.0)
MCH: 28.9 pg (ref 26.0–34.0)
MCH: 29 pg (ref 26.0–34.0)
MCHC: 32.2 g/dL (ref 30.0–36.0)
MCHC: 32 g/dL (ref 30.0–36.0)
MCV: 89.6 fL (ref 78.0–100.0)
MCV: 90.7 fL (ref 78.0–100.0)
PLATELETS: 167 10*3/uL (ref 150–400)
Platelets: 205 10*3/uL (ref 150–400)
RBC: 4.33 MIL/uL (ref 3.87–5.11)
RBC: 3.86 MIL/uL — AB (ref 3.87–5.11)
RDW: 13.4 % (ref 11.5–15.5)
RDW: 13.5 % (ref 11.5–15.5)
WBC: 5.6 10*3/uL (ref 4.0–10.5)
WBC: 7.6 10*3/uL (ref 4.0–10.5)

## 2015-11-05 LAB — BASIC METABOLIC PANEL
ANION GAP: 10 (ref 5–15)
BUN: 7 mg/dL (ref 6–20)
CHLORIDE: 102 mmol/L (ref 101–111)
CO2: 26 mmol/L (ref 22–32)
Calcium: 9.3 mg/dL (ref 8.9–10.3)
Creatinine, Ser: 1.03 mg/dL — ABNORMAL HIGH (ref 0.44–1.00)
GFR, EST NON AFRICAN AMERICAN: 54 mL/min — AB (ref >60)
Glucose, Bld: 111 mg/dL — ABNORMAL HIGH (ref 65–99)
POTASSIUM: 3.7 mmol/L (ref 3.5–5.1)
SODIUM: 138 mmol/L (ref 135–145)

## 2015-11-05 LAB — I-STAT TROPONIN, ED: Troponin i, poc: 0.22 ng/mL (ref 0.00–0.08)

## 2015-11-05 LAB — POCT ACTIVATED CLOTTING TIME: Activated Clotting Time: 378 seconds

## 2015-11-05 LAB — MRSA PCR SCREENING: MRSA by PCR: POSITIVE — AB

## 2015-11-05 LAB — TROPONIN I
TROPONIN I: 1.87 ng/mL — AB (ref <0.031)
Troponin I: 0.66 ng/mL (ref <0.031)

## 2015-11-05 SURGERY — LEFT HEART CATH AND CORONARY ANGIOGRAPHY
Anesthesia: LOCAL

## 2015-11-05 MED ORDER — OXYCODONE-ACETAMINOPHEN 5-325 MG PO TABS: 1.0000 | ORAL_TABLET | ORAL | Status: DC | PRN

## 2015-11-05 MED ORDER — ACETAMINOPHEN 325 MG PO TABS: 650.0000 mg | ORAL_TABLET | ORAL | Status: DC | PRN

## 2015-11-05 MED ORDER — NITROGLYCERIN 1 MG/10 ML FOR IR/CATH LAB
INTRA_ARTERIAL | Status: DC | PRN
  Administered 2015-11-05: 200 ug via INTRACORONARY

## 2015-11-05 MED ORDER — GI COCKTAIL ~~LOC~~
30.0000 mL | Freq: Once | ORAL | Status: DC
  Filled 2015-11-05: qty 30

## 2015-11-05 MED ORDER — LIDOCAINE HCL (PF) 1 % IJ SOLN
INTRAMUSCULAR | Status: AC
  Filled 2015-11-05: qty 30

## 2015-11-05 MED ORDER — SODIUM CHLORIDE 0.9 % IV SOLN: Freq: Once | INTRAVENOUS | Status: AC

## 2015-11-05 MED ORDER — MIDAZOLAM HCL 2 MG/2ML IJ SOLN
INTRAMUSCULAR | Status: AC
  Filled 2015-11-05: qty 4

## 2015-11-05 MED ORDER — FLUOXETINE HCL 20 MG PO CAPS
20.0000 mg | ORAL_CAPSULE | Freq: Every day | ORAL | Status: DC
Start: 2015-11-05 — End: 2015-11-06
  Administered 2015-11-05 – 2015-11-06 (×2): 20 mg via ORAL
  Filled 2015-11-05 (×2): qty 1

## 2015-11-05 MED ORDER — IOPAMIDOL (ISOVUE-370) INJECTION 76%
INTRAVENOUS | Status: DC | PRN
  Administered 2015-11-05: 240 mL via INTRA_ARTERIAL

## 2015-11-05 MED ORDER — ATORVASTATIN CALCIUM 80 MG PO TABS: 80.0000 mg | ORAL_TABLET | Freq: Every day | ORAL | Status: DC

## 2015-11-05 MED ORDER — SODIUM CHLORIDE 0.9 % IV SOLN: 250.0000 mL | INTRAVENOUS | Status: DC | PRN

## 2015-11-05 MED ORDER — IOPAMIDOL (ISOVUE-370) INJECTION 76%
INTRAVENOUS | Status: AC
  Filled 2015-11-05: qty 100

## 2015-11-05 MED ORDER — CALCIUM CARBONATE 1250 (500 CA) MG PO TABS
600.0000 mg | ORAL_TABLET | Freq: Two times a day (BID) | ORAL | Status: DC
  Administered 2015-11-05 – 2015-11-06 (×2): 625 mg via ORAL
  Filled 2015-11-05 (×2): qty 1

## 2015-11-05 MED ORDER — MECLIZINE HCL 12.5 MG PO TABS
12.5000 mg | ORAL_TABLET | Freq: Three times a day (TID) | ORAL | Status: DC | PRN
  Filled 2015-11-05: qty 2

## 2015-11-05 MED ORDER — ANGIOPLASTY BOOK
Freq: Once | Status: AC
  Administered 2015-11-05: 21:00:00
  Filled 2015-11-05: qty 1

## 2015-11-05 MED ORDER — POLYVINYL ALCOHOL 1.4 % OP SOLN
1.0000 [drp] | OPHTHALMIC | Status: DC
  Administered 2015-11-05 – 2015-11-06 (×5): 1 [drp] via OPHTHALMIC
  Filled 2015-11-05: qty 15

## 2015-11-05 MED ORDER — VERAPAMIL HCL 2.5 MG/ML IV SOLN
INTRAVENOUS | Status: AC
  Filled 2015-11-05: qty 2

## 2015-11-05 MED ORDER — SODIUM CHLORIDE 0.9 % WEIGHT BASED INFUSION: 1.0000 mL/kg/h | INTRAVENOUS | Status: AC

## 2015-11-05 MED ORDER — BIVALIRUDIN BOLUS VIA INFUSION - CUPID
INTRAVENOUS | Status: DC | PRN
  Administered 2015-11-05: 56.1 mg via INTRAVENOUS

## 2015-11-05 MED ORDER — ONDANSETRON HCL 4 MG/2ML IJ SOLN: 4.0000 mg | Freq: Four times a day (QID) | INTRAMUSCULAR | Status: DC | PRN

## 2015-11-05 MED ORDER — HEPARIN (PORCINE) IN NACL 2-0.9 UNIT/ML-% IJ SOLN
INTRAMUSCULAR | Status: AC
  Filled 2015-11-05: qty 1000

## 2015-11-05 MED ORDER — BIVALIRUDIN 250 MG IV SOLR
INTRAVENOUS | Status: AC
  Filled 2015-11-05: qty 250

## 2015-11-05 MED ORDER — SODIUM CHLORIDE 0.9% FLUSH: 3.0000 mL | INTRAVENOUS | Status: DC | PRN

## 2015-11-05 MED ORDER — HEPARIN SODIUM (PORCINE) 1000 UNIT/ML IJ SOLN
INTRAMUSCULAR | Status: AC
  Filled 2015-11-05: qty 1

## 2015-11-05 MED ORDER — VITAMIN D3 25 MCG (1000 UNIT) PO TABS
2000.0000 [IU] | ORAL_TABLET | Freq: Two times a day (BID) | ORAL | Status: DC
  Administered 2015-11-05 – 2015-11-06 (×2): 2000 [IU] via ORAL
  Filled 2015-11-05 (×4): qty 2

## 2015-11-05 MED ORDER — HEPARIN SODIUM (PORCINE) 1000 UNIT/ML IJ SOLN
INTRAMUSCULAR | Status: DC | PRN
  Administered 2015-11-05: 4000 [IU] via INTRAVENOUS

## 2015-11-05 MED ORDER — LISINOPRIL 5 MG PO TABS
2.5000 mg | ORAL_TABLET | Freq: Every day | ORAL | Status: DC
  Administered 2015-11-05 – 2015-11-06 (×2): 2.5 mg via ORAL
  Filled 2015-11-05 (×2): qty 1

## 2015-11-05 MED ORDER — SODIUM CHLORIDE 0.9% FLUSH: 3.0000 mL | Freq: Two times a day (BID) | INTRAVENOUS | Status: DC

## 2015-11-05 MED ORDER — LORAZEPAM 0.5 MG PO TABS
1.0000 mg | ORAL_TABLET | Freq: Every day | ORAL | Status: DC
  Administered 2015-11-05: 1 mg via ORAL
  Filled 2015-11-05: qty 2

## 2015-11-05 MED ORDER — VERAPAMIL HCL 2.5 MG/ML IV SOLN
INTRAVENOUS | Status: DC | PRN
  Administered 2015-11-05: 10 mL via INTRA_ARTERIAL

## 2015-11-05 MED ORDER — HEPARIN SODIUM (PORCINE) 5000 UNIT/ML IJ SOLN
5000.0000 [IU] | Freq: Three times a day (TID) | INTRAMUSCULAR | Status: DC
  Administered 2015-11-06: 06:00:00 5000 [IU] via SUBCUTANEOUS
  Filled 2015-11-05: qty 1

## 2015-11-05 MED ORDER — ASPIRIN EC 81 MG PO TBEC
81.0000 mg | DELAYED_RELEASE_TABLET | Freq: Every day | ORAL | Status: DC
  Administered 2015-11-06: 81 mg via ORAL
  Filled 2015-11-05: qty 1

## 2015-11-05 MED ORDER — NON FORMULARY: 40.0000 mg | Freq: Every day | Status: DC

## 2015-11-05 MED ORDER — TICAGRELOR 90 MG PO TABS
ORAL_TABLET | ORAL | Status: AC
  Filled 2015-11-05: qty 2

## 2015-11-05 MED ORDER — GABAPENTIN 300 MG PO CAPS
300.0000 mg | ORAL_CAPSULE | Freq: Three times a day (TID) | ORAL | Status: DC
  Administered 2015-11-05 – 2015-11-06 (×3): 300 mg via ORAL
  Filled 2015-11-05 (×3): qty 1

## 2015-11-05 MED ORDER — SALINE SPRAY 0.65 % NA SOLN
2.0000 | NASAL | Status: DC | PRN
Start: 2015-11-05 — End: 2015-11-06
  Filled 2015-11-05: qty 44

## 2015-11-05 MED ORDER — NITROGLYCERIN 1 MG/10 ML FOR IR/CATH LAB
INTRA_ARTERIAL | Status: AC
  Filled 2015-11-05: qty 10

## 2015-11-05 MED ORDER — SODIUM CHLORIDE 0.9 % IV SOLN
250.0000 mg | INTRAVENOUS | Status: DC | PRN
  Administered 2015-11-05: 1.75 mg/kg/h via INTRAVENOUS

## 2015-11-05 MED ORDER — ONDANSETRON HCL 4 MG/2ML IJ SOLN
4.0000 mg | Freq: Once | INTRAMUSCULAR | Status: AC | PRN
  Administered 2015-11-05: 4 mg via INTRAVENOUS
  Filled 2015-11-05: qty 2

## 2015-11-05 MED ORDER — NITROGLYCERIN 0.4 MG SL SUBL
0.4000 mg | SUBLINGUAL_TABLET | SUBLINGUAL | Status: DC | PRN
  Administered 2015-11-05 (×2): 0.4 mg via SUBLINGUAL
  Filled 2015-11-05: qty 1

## 2015-11-05 MED ORDER — TICAGRELOR 90 MG PO TABS
ORAL_TABLET | ORAL | Status: DC | PRN
  Administered 2015-11-05: 180 mg via ORAL

## 2015-11-05 MED ORDER — ASPIRIN 81 MG PO CHEW: 81.0000 mg | CHEWABLE_TABLET | Freq: Every day | ORAL | Status: DC

## 2015-11-05 MED ORDER — TICAGRELOR 90 MG PO TABS
90.0000 mg | ORAL_TABLET | Freq: Two times a day (BID) | ORAL | Status: DC
  Administered 2015-11-06 (×2): 90 mg via ORAL
  Filled 2015-11-05 (×2): qty 1

## 2015-11-05 MED ORDER — FENTANYL CITRATE (PF) 100 MCG/2ML IJ SOLN
INTRAMUSCULAR | Status: AC
  Filled 2015-11-05: qty 2

## 2015-11-05 MED ORDER — LOVASTATIN 20 MG PO TABS
40.0000 mg | ORAL_TABLET | Freq: Every day | ORAL | Status: DC
  Administered 2015-11-05: 40 mg via ORAL
  Filled 2015-11-05 (×2): qty 2

## 2015-11-05 MED ORDER — FENTANYL CITRATE (PF) 100 MCG/2ML IJ SOLN
INTRAMUSCULAR | Status: DC | PRN
  Administered 2015-11-05 (×2): 50 ug via INTRAVENOUS

## 2015-11-05 MED ORDER — MIDAZOLAM HCL 2 MG/2ML IJ SOLN
INTRAMUSCULAR | Status: DC | PRN
  Administered 2015-11-05 (×4): 1 mg via INTRAVENOUS

## 2015-11-05 MED ORDER — METOPROLOL TARTRATE 25 MG/10 ML ORAL SUSPENSION
12.5000 mg | Freq: Two times a day (BID) | ORAL | Status: DC
  Administered 2015-11-05 – 2015-11-06 (×2): 12.5 mg via ORAL
  Filled 2015-11-05 (×4): qty 5

## 2015-11-05 MED ORDER — NITROGLYCERIN 1 MG/10 ML FOR IR/CATH LAB
INTRA_ARTERIAL | Status: DC | PRN
  Administered 2015-11-05: 16:00:00

## 2015-11-05 MED ORDER — FAMOTIDINE IN NACL 20-0.9 MG/50ML-% IV SOLN
20.0000 mg | Freq: Once | INTRAVENOUS | Status: AC
  Administered 2015-11-05: 20 mg via INTRAVENOUS
  Filled 2015-11-05: qty 50

## 2015-11-05 MED ORDER — LIDOCAINE HCL (PF) 1 % IJ SOLN
INTRAMUSCULAR | Status: DC | PRN
  Administered 2015-11-05: 3 mL via INTRADERMAL

## 2015-11-05 MED ORDER — ONDANSETRON HCL 4 MG/2ML IJ SOLN
4.0000 mg | Freq: Four times a day (QID) | INTRAMUSCULAR | Status: DC | PRN
Start: 2015-11-05 — End: 2015-11-05

## 2015-11-05 MED ORDER — LORATADINE 10 MG PO TABS
10.0000 mg | ORAL_TABLET | Freq: Every day | ORAL | Status: DC
  Administered 2015-11-05 – 2015-11-06 (×2): 10 mg via ORAL
  Filled 2015-11-05 (×2): qty 1

## 2015-11-05 MED ORDER — SODIUM CHLORIDE 0.9% FLUSH
3.0000 mL | Freq: Two times a day (BID) | INTRAVENOUS | Status: DC
  Administered 2015-11-05: 17:00:00 3 mL via INTRAVENOUS

## 2015-11-05 MED ORDER — ASPIRIN 81 MG PO CHEW: 81.0000 mg | CHEWABLE_TABLET | ORAL | Status: DC

## 2015-11-05 MED ORDER — HEPARIN (PORCINE) IN NACL 2-0.9 UNIT/ML-% IJ SOLN
INTRAMUSCULAR | Status: DC | PRN
  Administered 2015-11-05: 1000 mL

## 2015-11-05 SURGICAL SUPPLY — 21 items
BALLN EMERGE MR 2.75X12 (BALLOONS) ×2
BALLN ~~LOC~~ EMERGE MR 3.25X6 (BALLOONS) ×2
BALLN ~~LOC~~ EMERGE MR 3.5X8 (BALLOONS) ×1 IMPLANT
BALLOON EMERGE MR 2.75X12 (BALLOONS) IMPLANT
BALLOON ~~LOC~~ EMERGE MR 3.25X6 (BALLOONS) IMPLANT
CATH EXTRAC PRONTO LP 6F RND (CATHETERS) ×1 IMPLANT
CATH INFINITI 5FR MULTPACK ANG (CATHETERS) ×1 IMPLANT
CATH VISTA GUIDE 6FR XBLAD3.0 (CATHETERS) ×1 IMPLANT
DEVICE RAD COMP TR BAND LRG (VASCULAR PRODUCTS) ×1 IMPLANT
ELECT DEFIB PAD ADLT CADENCE (PAD) ×1 IMPLANT
GLIDESHEATH SLEND A-KIT 6F 22G (SHEATH) ×1 IMPLANT
KIT ENCORE 26 ADVANTAGE (KITS) ×1 IMPLANT
KIT HEART LEFT (KITS) ×2 IMPLANT
PACK CARDIAC CATHETERIZATION (CUSTOM PROCEDURE TRAY) ×2 IMPLANT
STENT PROMUS PREM MR 3.0X16 (Permanent Stent) ×1 IMPLANT
TRANSDUCER W/STOPCOCK (MISCELLANEOUS) ×2 IMPLANT
TUBING CIL FLEX 10 FLL-RA (TUBING) ×2 IMPLANT
WIRE ASAHI PROWATER 180CM (WIRE) ×1 IMPLANT
WIRE HI TORQ VERSACORE-J 145CM (WIRE) ×1 IMPLANT
WIRE RUNTHROUGH .014X180CM (WIRE) ×1 IMPLANT
WIRE SAFE-T 1.5MM-J .035X260CM (WIRE) ×1 IMPLANT

## 2015-11-05 NOTE — ED Notes (Addendum)
Per EMS pt from home started having sudden onset of central chest pressure without radiation 7/10 after she got home from eating. Patient then had weakness, nausea and vomiting. Pt denies shortness of breath, diaphoresis, dizziness or lightheadedness. 12 lead shows NSR with some ST elevation in v2, v3.   Pt given 324 of ASA. No nitro- unable to get IV access

## 2015-11-05 NOTE — ED Provider Notes (Signed)
CSN: [id number]     Arrival date & time 11/05/15  1033 History   First MD Initiated Contact with Patient 11/05/15 1041     Chief Complaint  Patient presents with  . Chest Pain  . Emesis     (Consider location/radiation/quality/duration/timing/severity/associated sxs/prior Treatment) Patient is a 70 y.o. female presenting with chest pain and vomiting. The history is provided by the patient (Patient stated she started this morning with epigastric discomfort going into her chest).  Chest Pain Pain location:  Epigastric Pain quality: aching   Pain radiates to:  Does not radiate Pain radiates to the back: no   Pain severity:  Moderate Onset quality:  Sudden Timing:  Constant Associated symptoms: vomiting   Associated symptoms: no abdominal pain, no back pain, no cough, no fatigue and no headache   Emesis Associated symptoms: no abdominal pain, no diarrhea and no headaches     Past Medical History  Diagnosis Date  . Arthritis   . Hypertension   . GERD (gastroesophageal reflux disease)   . Hypercholesteremia   . Heart murmur   . Anxiety and depression   . H/O hiatal hernia   . Headache(784.0)     sinus  . Dysrhythmia   . Shortness of breath     "stomach is in lungs" when had hernia 2014 none now(07/28/14)  . Pneumonia     hx  . Depression   . Anxiety   . Anemia     hx  . Shingles 5/15    hx  . Breast cancer ([place]) 3/16   Past Surgical History  Procedure Laterality Date  . Total knee arthroplasty Bilateral   . Esophageal manometry N/A 07/02/2012    Procedure: ESOPHAGEAL MANOMETRY (EM);  Surgeon: Lear Ng, MD;  [facility];  Service: Endoscopy;  Laterality: N/A;  schooler to read  . Tonsillectomy  as child  . Hiatal hernia repair N/A 08/10/2012    Procedure: LAPAROSCOPIC REPAIR OF HIATAL HERNIA WITH MESH AND EGD WITH PEG TUBE PLACEMENT;  Surgeon: Ralene Ok, MD;  [facility];  Service: General;  Laterality: N/A;  . Breast surgery      r  breast biopsy  . Joint replacement Bilateral 2011,2012    knees  . Eye surgery  2013    tube placed Left ,tear duct  . Breast lumpectomy with sentinel lymph node biopsy Right 09/22/2014    Procedure: RIGHT BREAST LUMPECTOMY WITH NEEDLE LOCALIZATION AND RIGHT AXILLARY SENTINEL LYMPH NODE BIOPSY;  Surgeon: [name], MD;  Location: [place];  Service: General;  Laterality: Right;  . Portacath placement Left 09/22/2014    Procedure: INSERTION PORT-A-CATH;  Surgeon: [name], MD;  Location: [place];  Service: General;  Laterality: Left;   Family History  Problem Relation Age of Onset  . Heart disease Mother     67s, CHF  . Heart disease Father     d/o MI at 66  . Cancer Father     skin  . Diabetes Father   . Cancer Sister     breast  . Diabetes Sister   . Heart disease Sister     CABG in mid-60s   Social History  Substance Use Topics  . Smoking status: Never Smoker   . Smokeless tobacco: Never Used  . Alcohol Use: No   OB History    No data available     Review of Systems  Constitutional: Negative for appetite change and fatigue.  HENT: Negative for congestion,  ear discharge and sinus pressure.   Eyes: Negative for discharge.  Respiratory: Negative for cough.   Cardiovascular: Positive for chest pain.  Gastrointestinal: Positive for vomiting. Negative for abdominal pain and diarrhea.  Genitourinary: Negative for frequency and hematuria.  Musculoskeletal: Negative for back pain.  Skin: Negative for rash.  Neurological: Negative for seizures and headaches.  Psychiatric/Behavioral: Negative for hallucinations.      Allergies  Shellfish allergy; Lactose intolerance (gi); Aleve; Aspirin; Ciprofloxacin; Doxycycline; Evista; Fosamax; Pravachol; Sulfa antibiotics; Welchol; and Zocor  Home Medications   Prior to Admission medications   Medication Sig Start Date End Date Taking? Authorizing Provider  calcium carbonate (OS-CAL) 600 MG TABS Take 600 mg by mouth 2 (two) times  daily with a meal.   Yes Historical Provider, MD  carboxymethylcellulose (REFRESH PLUS) 0.5 % SOLN Place 1 drop into both eyes every 3 (three) hours.    Yes Historical Provider, MD  Cholecalciferol (VITAMIN D) 2000 UNITS tablet Take 2,000 Units by mouth 2 (two) times daily.    Yes Historical Provider, MD  dexlansoprazole (DEXILANT) 60 MG capsule Take 60 mg by mouth daily.   Yes Historical Provider, MD  fexofenadine (ALLEGRA) 180 MG tablet Take 180 mg by mouth daily.   Yes Historical Provider, MD  FLUoxetine (PROZAC) 20 MG capsule Take 20 mg by mouth daily. 06/24/14  Yes Historical Provider, MD  gabapentin (NEURONTIN) 300 MG capsule Take 1 capsule (300 mg total) by mouth 3 (three) times daily. 08/07/15  Yes Max T Hyatt, DPM  LORazepam (ATIVAN) 1 MG tablet Take 1 mg at bedtime and prior to radiation as directed and as needed for anxiety. Patient taking differently: Take 1 mg by mouth at bedtime. T 01/29/15  Yes Thea Silversmith, MD  lovastatin (MEVACOR) 40 MG tablet Take 40 mg by mouth at bedtime.    Yes Historical Provider, MD  meclizine (ANTIVERT) 25 MG tablet Take 12.5-25 mg by mouth 3 (three) times daily as needed for dizziness.   Yes Historical Provider, MD  meloxicam (MOBIC) 15 MG tablet TAKE 1 TABLET (15 MG TOTAL) BY MOUTH DAILY. 08/06/15  Yes Nicholas Lose, MD  sodium chloride (OCEAN) 0.65 % SOLN nasal spray Place 2 sprays into both nostrils as needed for congestion.   Yes Historical Provider, MD   BP 110/76 mmHg  Pulse 66  Temp(Src) 98.8 F (37.1 C)  Resp 18  Ht 5' (1.524 m)  Wt 165 lb (74.844 kg)  BMI 32.22 kg/m2  SpO2 98% Physical Exam  Constitutional: She is oriented to person, place, and time. She appears well-developed.  HENT:  Head: Normocephalic.  Eyes: Conjunctivae and EOM are normal. No scleral icterus.  Neck: Neck supple. No thyromegaly present.  Cardiovascular: Normal rate and regular rhythm.  Exam reveals no gallop and no friction rub.   No murmur heard. Pulmonary/Chest: No  stridor. She has no wheezes. She has no rales. She exhibits no tenderness.  Abdominal: She exhibits no distension. There is no tenderness. There is no rebound.  Musculoskeletal: Normal range of motion. She exhibits no edema.  Lymphadenopathy:    She has no cervical adenopathy.  Neurological: She is oriented to person, place, and time. She exhibits normal muscle tone. Coordination normal.  Skin: No rash noted. No erythema.  Psychiatric: She has a normal mood and affect. Her behavior is normal.    ED Course  Procedures (including critical care time) Labs Review Labs Reviewed  BASIC METABOLIC PANEL - Abnormal; Notable for the following:    Glucose, Bld  111 (*)    Creatinine, Ser 1.03 (*)    GFR calc non Af Amer 54 (*)    All other components within normal limits  HEPATIC FUNCTION PANEL - Abnormal; Notable for the following:    Bilirubin, Direct <0.1 (*)    All other components within normal limits  TROPONIN I - Abnormal; Notable for the following:    Troponin I 0.66 (*)    All other components within normal limits  I-STAT TROPOININ, ED - Abnormal; Notable for the following:    Troponin i, poc 0.22 (*)    All other components within normal limits  CBC  TROPONIN I  TROPONIN I    Imaging Review Dg Chest 2 View  11/05/2015  CLINICAL DATA:  Chest pain and shortness of breath. EXAM: CHEST  2 VIEW COMPARISON:  10/21/2014 FINDINGS: The heart size and mediastinal contours are within normal limits. Both lungs are clear. Spondylosis noted within the thoracic spine. IMPRESSION: No active cardiopulmonary disease. Electronically Signed   By: Kerby Moors M.D.   On: 11/05/2015 11:30   I have personally reviewed and evaluated these images and lab results as part of my medical decision-making.   EKG Interpretation None     CRITICAL CARE Performed by: [place] L Total critical care time:35 minutes Critical care time was exclusive of separately billable procedures and treating other  patients. Critical care was necessary to treat or prevent imminent or life-threatening deterioration. Critical care was time spent personally by me on the following activities: development of treatment plan with patient and/or surrogate as well as nursing, discussions with consultants, evaluation of patient's response to treatment, examination of patient, obtaining history from patient or surrogate, ordering and performing treatments and interventions, ordering and review of laboratory studies, ordering and review of radiographic studies, pulse oximetry and re-evaluation of patient's condition.  MDM   Final diagnoses:  None    Patient with chest pain and elevated troponin. Initial EKG did not show a STEMI. This was reviewed with the cardiologist when the troponin return. Patient was seen by cardiology and taken to the Cath Lab    Milton Ferguson, MD 11/05/15 1342

## 2015-11-05 NOTE — ED Notes (Signed)
Jewelry, glasses and dentures given to husband along with medication and clothing.  Consent obtained and witnessed.

## 2015-11-05 NOTE — H&P (Addendum)
Patient ID: Angela Bond MRN: [id number], DOB/AGE: Jul 25, 1945   Admit date: 11/05/2015   Primary Physician: Simona Huh, MD Primary Cardiologist: Dr. Marlou Porch  Pt. Profile:  Angela Bond is a pleasant 70 year old female with past medical history of PAF, HTN, HLD and breast CA s/p chemo and radiation therapy presented with chest pain with elevated trop 0.22  Problem List  Past Medical History  Diagnosis Date  . Arthritis   . Hypertension   . GERD (gastroesophageal reflux disease)   . Hypercholesteremia   . Heart murmur   . Anxiety and depression   . H/O hiatal hernia   . Headache(784.0)     sinus  . Dysrhythmia   . Shortness of breath     "stomach is in lungs" when had hernia 2014 none now(07/28/14)  . Pneumonia     hx  . Depression   . Anxiety   . Anemia     hx  . Shingles 5/15    hx  . Breast cancer ([place]) 3/16    Past Surgical History  Procedure Laterality Date  . Total knee arthroplasty Bilateral   . Esophageal manometry N/A 07/02/2012    Procedure: ESOPHAGEAL MANOMETRY (EM);  Surgeon: Lear Ng, MD;  Location: [facility];  Service: Endoscopy;  Laterality: N/A;  schooler to read  . Tonsillectomy  as child  . Hiatal hernia repair N/A 08/10/2012    Procedure: LAPAROSCOPIC REPAIR OF HIATAL HERNIA WITH MESH AND EGD WITH PEG TUBE PLACEMENT;  Surgeon: Ralene Ok, MD;  Location: [facility];  Service: General;  Laterality: N/A;  . Breast surgery      r breast biopsy  . Joint replacement Bilateral 2011,2012    knees  . Eye surgery  2013    tube placed Left ,tear duct  . Breast lumpectomy with sentinel lymph node biopsy Right 09/22/2014    Procedure: RIGHT BREAST LUMPECTOMY WITH NEEDLE LOCALIZATION AND RIGHT AXILLARY SENTINEL LYMPH NODE BIOPSY;  Surgeon: [name], MD;  Location: [facility];  Service: General;  Laterality: Right;  . Portacath placement Left 09/22/2014    Procedure: INSERTION PORT-A-CATH;  Surgeon: [name], MD;  Location: [facility];   Service: General;  Laterality: Left;     Allergies  Allergies  Allergen Reactions  . Shellfish Allergy Anaphylaxis    ANAPHYLAXIS TO OYSTERS  . Lactose Intolerance (Gi) Other (See Comments)  . Aleve [Naproxen] Nausea And Vomiting  . Aspirin Other (See Comments)    Burns stomach  . Ciprofloxacin Nausea And Vomiting  . Doxycycline Nausea And Vomiting  . Evista [Raloxifene] Other (See Comments)    Leg aches  . Fosamax [Alendronate Sodium] Other (See Comments)    Leg aches  . Pravachol [Pravastatin] Other (See Comments)    GI upset   . Sulfa Antibiotics Nausea Only  . Welchol [Colesevelam Hcl] Other (See Comments)    Stomach cramps  . Zocor [Simvastatin] Rash    HPI  Angela Bond is a pleasant 70 year old female with past medical history of PAF, HTN, HLD and breast CA. she underwent cardiac catheterization by Dr. Marlou Porch on 01/23/2012 which showed none flow-limiting 30% mid LAD lesion at the bifurcation of the first diagonal branch, LVEDP 20 mmHg, EF 60%. It appears the cardiac catheterization was done for ongoing symptom concerning for angina. She was placed on medical therapy afterward. She has failed to follow-up with cardiology service as outpatient. According to the patient, she was not informed she needed to follow-up as outpatient. She  was seen again by Dr. Mare Ferrari on 08/12/2012 for paroxysmal atrial fibrillation in the setting of laparoscopic hiatal hernia repair, esophagogastroduodenoscopy and PEG tube placement. She was also noted to have potassium of 3.2 at the time. Given the transient nature of atrial fibrillation without recurrence and postoperative nature, it was felt she did not need long-term systemic anticoagulation. Her last Myoview was obtained on 09/21/2013 which showed EF 78%, no stress-induced ischemia. She was diagnosed with right breast cancer in Apr 2016 and underwent lumpectomy and right axillary sentinel lymph node biopsy on 09/22/2014. She underwent adjuvant  chemotherapy and radiation therapy. She has completed both chemo and radiation therapy. Her Port-A-Cath has been removed. No antiestrogen therapy planned since she is ER/PR negative. Her last visit with Dr. Lindi Adie was on 09/15/2015 at which time she was doing well. Six-month follow-up was arranged.  According to the patient, she has chronic baseline dyspnea. However for the past month, she has been noticing increasing amount of dyspnea on exertion accompanied by chest pressure both at rest and with stress. She says she she usually sit down and relax when she has chest pain. She also takes Warehouse [employer]. She has bilateral lower extremity cramping pain and abdominal discomfort as well. In the morning of 11/05/2015, while she was getting out of her car she became diaphoretic, short of breath, having chest discomfort and significant weakness. Initial EKG obtained by EMS shows possible ST elevation in V1 and V2. However this was quick to resolve on repeat EKG. EKG obtained in the emergency room however shows ST elevation in V5 and V6 without reciprocal changes. Again this EKG was repeated, ST elevation in V5 and V6 does not appears to be apparent on repeat EKG. POC Troponin however came back 0.22. Her blood pressure is normal and her renal function is normal. Cardiology has been consulted for chest pain and elevated troponin.    Home Medications  Prior to Admission medications   Medication Sig Start Date End Date Taking? Authorizing Provider  calcium carbonate (OS-CAL) 600 MG TABS Take 600 mg by mouth 2 (two) times daily with a meal.   Yes Historical Provider, MD  carboxymethylcellulose (REFRESH PLUS) 0.5 % SOLN Place 1 drop into both eyes every 3 (three) hours.    Yes Historical Provider, MD  Cholecalciferol (VITAMIN D) 2000 UNITS tablet Take 2,000 Units by mouth 2 (two) times daily.    Yes Historical Provider, MD  dexlansoprazole (DEXILANT) 60 MG capsule Take 60 mg by mouth daily.   Yes Historical  Provider, MD  fexofenadine (ALLEGRA) 180 MG tablet Take 180 mg by mouth daily.   Yes Historical Provider, MD  FLUoxetine (PROZAC) 20 MG capsule Take 20 mg by mouth daily. 06/24/14  Yes Historical Provider, MD  gabapentin (NEURONTIN) 300 MG capsule Take 1 capsule (300 mg total) by mouth 3 (three) times daily. 08/07/15  Yes Max T Hyatt, DPM  LORazepam (ATIVAN) 1 MG tablet Take 1 mg at bedtime and prior to radiation as directed and as needed for anxiety. Patient taking differently: Take 1 mg by mouth at bedtime. T 01/29/15  Yes Thea Silversmith, MD  lovastatin (MEVACOR) 40 MG tablet Take 40 mg by mouth at bedtime.    Yes Historical Provider, MD  meclizine (ANTIVERT) 25 MG tablet Take 12.5-25 mg by mouth 3 (three) times daily as needed for dizziness.   Yes Historical Provider, MD  meloxicam (MOBIC) 15 MG tablet TAKE 1 TABLET (15 MG TOTAL) BY MOUTH DAILY. 08/06/15  Yes Nicholas Lose,  MD  sodium chloride (OCEAN) 0.65 % SOLN nasal spray Place 2 sprays into both nostrils as needed for congestion.   Yes Historical Provider, MD    Family History  Family History  Problem Relation Age of Onset  . Heart disease Mother     67s, CHF  . Heart disease Father     d/o MI at 40  . Cancer Father     skin  . Diabetes Father   . Cancer Sister     breast  . Diabetes Sister   . Heart disease Sister     CABG in mid-60s    Social History  Social History   Social History  . Marital Status: Married    Spouse Name: N/A  . Number of Children: 1  . Years of Education: N/A   Occupational History  . Not on file.   Social History Main Topics  . Smoking status: Never Smoker   . Smokeless tobacco: Never Used  . Alcohol Use: No  . Drug Use: No  . Sexual Activity: No   Other Topics Concern  . Not on file   Social History Narrative   Lives with husband.  Does not use assist device.       Review of Systems General:  No chills, fever, night sweats or weight changes.  Cardiovascular:  No edema, orthopnea,  palpitations, paroxysmal nocturnal dyspnea. +chest pain, dyspnea on exertion Dermatological: No rash, lesions/masses Respiratory: No cough, dyspnea Urologic: No hematuria, dysuria Abdominal:   No nausea, vomiting, diarrhea, bright red blood per rectum, melena, or hematemesis +abdominal pain Neurologic:  No visual changes, wkns, changes in mental status. All other systems reviewed and are otherwise negative except as noted above.  Physical Exam  Blood pressure 114/69, pulse 73, temperature 98.8 F (37.1 C), resp. rate 15, height 5' (1.524 m), weight 165 lb (74.844 kg), SpO2 100 %.  General: Pleasant, NAD Psych: Normal affect. Neuro: Alert and oriented X 3. Moves all extremities spontaneously. HEENT: Normal  Neck: Supple without bruits or JVD. Lungs:  Resp regular and unlabored, CTA. Heart: RRR no s3, s4, or murmurs. Abdomen: Soft, non-tender, non-distended, BS + x 4.  Extremities: No clubbing, cyanosis or edema. DP/PT/Radials 2+ and equal bilaterally.  Labs  Troponin [facility] of Care Test)  Recent Labs  11/05/15 1111  TROPIPOC 0.22*   No results for input(s): CKTOTAL, CKMB, TROPONINI in the last 72 hours. Lab Results  Component Value Date   WBC 5.6 11/05/2015   HGB 12.5 11/05/2015   HCT 38.8 11/05/2015   MCV 89.6 11/05/2015   PLT 205 11/05/2015    Recent Labs Lab 11/05/15 1045  NA 138  K 3.7  CL 102  CO2 26  BUN 7  CREATININE 1.03*  CALCIUM 9.3  GLUCOSE 111*   Lab Results  Component Value Date   CHOL 166 09/21/2013   HDL 55 09/21/2013   LDLCALC 90 09/21/2013   TRIG 107 09/21/2013   No results found for: DDIMER   Radiology/Studies  Dg Chest 2 View  11/05/2015  CLINICAL DATA:  Chest pain and shortness of breath. EXAM: CHEST  2 VIEW COMPARISON:  10/21/2014 FINDINGS: The heart size and mediastinal contours are within normal limits. Both lungs are clear. Spondylosis noted within the thoracic spine. IMPRESSION: No active cardiopulmonary disease. Electronically  Signed   By: Kerby Moors M.D.   On: 11/05/2015 11:30    ECG  NSR with minimal J point elevation in V5-V6 consistent with early repol  Echocardiogram  08/12/2012  LV EF: 55% -  60%  ------------------------------------------------------------ Indications:   Atrial fibrillation - 427.31.  ------------------------------------------------------------ History:  PMH: Sliding hiatal hernia status post lap PEH repair. Angina pectoris. Risk factors: Hypertension.  ------------------------------------------------------------ Study Conclusions  - Left ventricle: The cavity size was normal. Wall thickness was normal. Systolic function was normal. The estimated ejection fraction was in the range of 55% to 60%. Wall motion was normal; there were no regional wall motion abnormalities. Left ventricular diastolic function parameters were normal. No previous study for comparison. - Aortic valve: Mildly calcified annulus. Trileaflet; mildly calcified leaflets. Transvalvular velocity was increased more than expected to mild degree - no clear stenosis. - Mitral valve: Mildly thickened leaflets . Mild regurgitation. - Left atrium: The atrium was mildly dilated. - Tricuspid valve: Mild regurgitation. Peak RV-RA gradient: 44mm Hg (S). - Inferior vena cava: Not visualized. - Pericardium, extracardiac: There was no pericardial effusion.    ASSESSMENT AND PLAN  1. Chest pain with elevated trop  - minimal CAD on previous cath 2013 with 30% mid LAD. J point elevation in lateral leads more than likely early repol given lack of reciprocal changes  - however unable to explain why her trop is elevated, will obtain another trop.   - discussed with MD, plan for cardiac cath for definitive assessment.   - Risk and benefit of procedure explained to the patient who display clear understanding and agree to proceed. Discussed with patient possible procedural risk include bleeding,  vascular injury, renal injury, arrythmia, MI, stroke and loss of limb or life.  - given h/o lymph biopsy on R side, will do cath via L radial or R femoral  - she has allergy toward Oyster, but denies any contrast allergy. She says ASA burns her stomach and took severe baby ASA in the ED without problem  2. Abdominal pain  - unclear cause, she is having a lot of abdominal pain  3. Bilateral LE pain  - chronic. Lower suspicion of PE given her HR of 60s  4. R breast cancer s/p lumpectomy and chemo/radiation therapy 2016: followed by Dr. Lindi Adie  5. HTN  6. HLD  7. H/o post op PAF  - CHA2DS2-Vasc score 4 (female, HTN, CAD, age)   - not on systemic anticoagulation due to transient post op nature  Signed, Almyra Deforest, PA-C 11/05/2015, 12:19 PM  Personally seen and examined. Agree with above.  70 year old female with previous 30% LAD lesion here with nausea, vomiting 2, chest discomfort with elevated troponin and dynamic EKG changes.  -We will proceed to cath lab. Non-ST elevation myocardial infarction. Left radial artery approach. Risks and benefits of the procedure have been discussed including stroke, heart attack, death, renal impairment.  - Right arm surgery in May, splinted. Left radial approach would be optimal.  - States that she has no future surgeries.  Candee Furbish, MD

## 2015-11-06 ENCOUNTER — Encounter (HOSPITAL_COMMUNITY): Payer: Self-pay | Admitting: Interventional Cardiology

## 2015-11-06 DIAGNOSIS — E785 Hyperlipidemia, unspecified: Secondary | ICD-10-CM | POA: Diagnosis not present

## 2015-11-06 DIAGNOSIS — I2102 ST elevation (STEMI) myocardial infarction involving left anterior descending coronary artery: Secondary | ICD-10-CM | POA: Diagnosis not present

## 2015-11-06 DIAGNOSIS — I249 Acute ischemic heart disease, unspecified: Secondary | ICD-10-CM | POA: Diagnosis not present

## 2015-11-06 DIAGNOSIS — I214 Non-ST elevation (NSTEMI) myocardial infarction: Secondary | ICD-10-CM

## 2015-11-06 DIAGNOSIS — I480 Paroxysmal atrial fibrillation: Secondary | ICD-10-CM | POA: Diagnosis not present

## 2015-11-06 LAB — BASIC METABOLIC PANEL
ANION GAP: 6 (ref 5–15)
BUN: 8 mg/dL (ref 6–20)
CHLORIDE: 104 mmol/L (ref 101–111)
CO2: 27 mmol/L (ref 22–32)
Calcium: 8.8 mg/dL — ABNORMAL LOW (ref 8.9–10.3)
Creatinine, Ser: 1.05 mg/dL — ABNORMAL HIGH (ref 0.44–1.00)
GFR calc Af Amer: >60 mL/min (ref >60)
GFR calc non Af Amer: 53 mL/min — ABNORMAL LOW (ref >60)
GLUCOSE: 92 mg/dL (ref 65–99)
POTASSIUM: 4.5 mmol/L (ref 3.5–5.1)
Sodium: 137 mmol/L (ref 135–145)

## 2015-11-06 LAB — HEMOGLOBIN A1C
HEMOGLOBIN A1C: 5.5 % (ref 4.8–5.6)
Mean Plasma Glucose: 111 mg/dL

## 2015-11-06 LAB — LIPID PANEL
Cholesterol: 158 mg/dL (ref 0–200)
HDL: 47 mg/dL (ref >40)
LDL CALC: 88 mg/dL (ref 0–99)
TRIGLYCERIDES: 113 mg/dL (ref <150)
Total CHOL/HDL Ratio: 3.4 RATIO
VLDL: 23 mg/dL (ref 0–40)

## 2015-11-06 LAB — CBC
HEMATOCRIT: 33.9 % — AB (ref 36.0–46.0)
HEMOGLOBIN: 11.3 g/dL — AB (ref 12.0–15.0)
MCH: 30.8 pg (ref 26.0–34.0)
MCHC: 33.3 g/dL (ref 30.0–36.0)
MCV: 92.4 fL (ref 78.0–100.0)
Platelets: 201 10*3/uL (ref 150–400)
RBC: 3.67 MIL/uL — ABNORMAL LOW (ref 3.87–5.11)
RDW: 13.8 % (ref 11.5–15.5)
WBC: 7 10*3/uL (ref 4.0–10.5)

## 2015-11-06 LAB — TROPONIN I
TROPONIN I: 3.12 ng/mL — AB (ref <0.031)
Troponin I: 2.24 ng/mL (ref <0.031)

## 2015-11-06 MED ORDER — CHLORHEXIDINE GLUCONATE CLOTH 2 % EX PADS
6.0000 | MEDICATED_PAD | Freq: Every day | CUTANEOUS | Status: DC
  Administered 2015-11-06: 11:00:00 6 via TOPICAL

## 2015-11-06 MED ORDER — ASPIRIN 81 MG PO TBEC: 81.0000 mg | DELAYED_RELEASE_TABLET | Freq: Every day | ORAL | Status: AC

## 2015-11-06 MED ORDER — LISINOPRIL 2.5 MG PO TABS: 2.5000 mg | ORAL_TABLET | Freq: Every day | ORAL | Status: DC

## 2015-11-06 MED ORDER — NITROGLYCERIN 0.4 MG SL SUBL: 0.4000 mg | SUBLINGUAL_TABLET | SUBLINGUAL | Status: DC | PRN

## 2015-11-06 MED ORDER — METOPROLOL TARTRATE 25 MG PO TABS: 12.5000 mg | ORAL_TABLET | Freq: Two times a day (BID) | ORAL | Status: DC

## 2015-11-06 MED ORDER — TICAGRELOR 90 MG PO TABS: 90.0000 mg | ORAL_TABLET | Freq: Two times a day (BID) | ORAL | Status: DC

## 2015-11-06 MED ORDER — MUPIROCIN 2 % EX OINT
1.0000 "application " | TOPICAL_OINTMENT | Freq: Two times a day (BID) | CUTANEOUS | Status: DC
  Filled 2015-11-06: qty 22

## 2015-11-06 NOTE — Progress Notes (Signed)
Dr.Patel made aware of 6 beat NSVT  and PVC'S. Patient stable I will continue to monitor.

## 2015-11-06 NOTE — Progress Notes (Signed)
Patient Profile: 70 year old female with previous 30% LAD lesion who presented 6/22 with nausea, vomiting 2, chest discomfort with elevated troponin and dynamic EKG changes. Admitted for NSTEMI.   Subjective: No complaints. She denies any recurrent CP. No dyspnea. Left radial cath site is stable.   Objective: Vital signs in last 24 hours: Temp:  [97.6 F (36.4 C)-98.8 F (37.1 C)] 97.6 F (36.4 C) (06/23 0425) Pulse Rate:  [55-84] 56 (06/23 0425) Resp:  [6-24] 12 (06/23 0425) BP: (110-161)/(46-88) 129/46 mmHg (06/23 0425) SpO2:  [94 %-100 %] 99 % (06/23 0425) Weight:  [165 lb (74.844 kg)-177 lb 14.6 oz (80.7 kg)] 177 lb 14.6 oz (80.7 kg) (06/23 0425) Last BM Date: 11/04/15  Intake/Output from previous day: 06/22 0701 - 06/23 0700 In: 779.2 [P.O.:480; I.V.:299.2] Out: -  Intake/Output this shift: Total I/O In: 112.2 [I.V.:112.2] Out: -   Medications Current Facility-Administered Medications  Medication Dose Route Frequency Provider Last Rate Last Dose  . 0.9 %  sodium chloride infusion  250 mL Intravenous PRN Belva Crome, MD      . acetaminophen (TYLENOL) tablet 650 mg  650 mg Oral Q4H PRN Almyra Deforest, PA      . aspirin chewable tablet 81 mg  81 mg Oral [address] [place], [place]   81 mg at 11/06/15 [id number]  . aspirin EC tablet 81 mg  81 mg Oral Daily Almyra Deforest, [place]      . calcium carbonate (OS-CAL - dosed in mg of elemental calcium) tablet 625 mg  625 mg Oral BID WC Almyra Deforest, PA   625 mg at 11/05/15 1706  . cholecalciferol (VITAMIN D) tablet 2,000 Units  2,000 Units Oral BID Almyra Deforest, PA   2,000 Units at 11/05/15 2113  . FLUoxetine (PROZAC) capsule 20 mg  20 mg Oral Daily Almyra Deforest, [place]   20 mg at 11/05/15 1706  . gabapentin (NEURONTIN) capsule 300 mg  300 mg Oral TID Almyra Deforest, PA   300 mg at 11/05/15 2113  . heparin injection 5,000 Units  5,000 Units Subcutaneous Q8H Belva Crome, MD   5,000 Units at 11/06/15 [phone]  . lisinopril (PRINIVIL,ZESTRIL) tablet 2.5 mg  2.5 mg Oral Daily  Belva Crome, MD   2.5 mg at 11/05/15 1706  . loratadine (CLARITIN) tablet 10 mg  10 mg Oral Daily Almyra Deforest, [place]   10 mg at 11/05/15 1706  . LORazepam (ATIVAN) tablet 1 mg  1 mg Oral QHS Almyra Deforest, PA   1 mg at 11/05/15 2113  . lovastatin (MEVACOR) tablet 40 mg  40 mg Oral q1800 Lauren D Bajbus, RPH   40 mg at 11/05/15 1747  . meclizine (ANTIVERT) tablet 12.5-25 mg  12.5-25 mg Oral TID PRN Almyra Deforest, PA      . metoprolol tartrate (LOPRESSOR) 25 mg/10 mL oral suspension 12.5 mg  12.5 mg Oral BID Belva Crome, MD   12.5 mg at 11/05/15 2114  . nitroGLYCERIN (NITROSTAT) SL tablet 0.4 mg  0.4 mg Sublingual Q5 min PRN Milton Ferguson, MD   0.4 mg at 11/05/15 1155  . ondansetron (ZOFRAN) injection 4 mg  4 mg Intravenous Q6H PRN Almyra Deforest, PA      . oxyCODONE-acetaminophen (PERCOCET/ROXICET) 5-325 MG per tablet 1-2 tablet  1-2 tablet Oral Q4H PRN Belva Crome, MD      . polyvinyl alcohol (LIQUIFILM TEARS) 1.4 % ophthalmic solution 1 drop  1 drop Both Eyes Q3H Almyra Deforest, [place]   1  drop at 11/06/15 [id number]  . sodium chloride (OCEAN) 0.65 % nasal spray 2 spray  2 spray Each Nare PRN Almyra Deforest, PA      . sodium chloride flush (NS) 0.9 % injection 3 mL  3 mL Intravenous Q12H Belva Crome, MD   3 mL at 11/05/15 1710  . sodium chloride flush (NS) 0.9 % injection 3 mL  3 mL Intravenous PRN Belva Crome, MD      . ticagrelor [facility]) tablet 90 mg  90 mg Oral BID Belva Crome, MD   90 mg at 11/06/15 0357    PE: General appearance: alert, cooperative and no distress Neck: no carotid bruit and no JVD Lungs: clear to auscultation bilaterally Heart: regular rate and rhythm, S1, S2 normal, no murmur, click, rub or gallop Extremities: no LEE Pulses: 2+ and symmetric Skin: warm and dry Neurologic: Grossly normal  Lab Results:   Recent Labs  11/05/15 1045 11/05/15 1725 11/06/15 0519  WBC 5.6 7.6 7.0  HGB 12.5 11.2* 11.3*  HCT 38.8 35.0* 33.9*  PLT 205 167 201   BMET  Recent Labs  11/05/15 1045  11/05/15 1725  NA 138  --   K 3.7  --   CL 102  --   CO2 26  --   GLUCOSE 111*  --   BUN 7  --   CREATININE 1.03* 0.98  CALCIUM 9.3  --    Cardiac Panel (last 3 results)  Recent Labs  11/05/15 1250 11/05/15 1725 11/05/15 2330  TROPONINI 0.66* 1.87* 3.12*    Studies/Results: Procedures    Coronary Stent Intervention   Left Heart Cath and Coronary Angiography    Conclusion    1. Prox RCA to Mid RCA lesion, 30% stenosed. 2. Mid LAD lesion, 99% stenosed. Post intervention, there is a 5% residual stenosis. 3. Ost 2nd Diag lesion, 35% stenosed.   Acute coronary syndrome caused by acute/subacute thrombotic obstruction of the proximal LAD forming a [place] 111 bifurcation lesion with the large diagonal #1.  Luminal irregularities are noted in the circumflex and right coronary.  Severe anteroapical hypokinesis with estimated ejection fraction 35-40%. LV end-diastolic pressure is normal.  Successful PTCA and stent of the LAD thrombus containing lesion with reduction in the 95% stenosis to 0% with TIMI grade 3 flow using a drug-eluting stent postdilated to 3.5 mm in diameter. The diagonal branch though "jailed" ended with less than 40% ostial obstruction.    Assessment/Plan  Active Problems:   Atrial fibrillation (HCC)   Hypertension   Chest pressure   ACS (acute coronary syndrome) (HCC)   NSTEMI (non-ST elevated myocardial infarction) ([place])   1. ACS/NSTEMI: Acute coronary syndrome caused by acute/subacute thrombotic obstruction of the proximal LAD forming a [place] 111 bifurcation lesion with the large diagonal #1. S/p successful PCI + stenting of the LAD. EF 35-40%. No recurrent CP. Continue DAPT with ASA + Brilinta, high intensity statin, BB + ACE-I. Ambulate with cardiac rehab.   2.LV Systolic Dysfunction: EF [id number] by cath. Suspect the left ventricular function will significantly improved and of the wall motion abnormality is related to "stunned" myocardium. BB +  ACE-I prescribed. No dyspnea. Obtain f/u office echo in several weeks to reassess LVF. Advise low sodium diet.   3. HTN: BP well controlled this AM, in the [id number] systolic. Continue metoprolol and lisinopril.  4. HLD: Continue statin therapy. FLP pending.   5. H/o post op PAF: - CHA2DS2-Vasc score 4 (female, HTN, CAD, age)  -  not on systemic anticoagulation due to transient post op nature  6. NSVT: 6 beat run of NSVT on telemetry ovenight. EF 35-40%. Patient was asymptomatic. Continue BB therapy with metoprolol. HR currently in the 60s. NSR.   7. Dispo: Ambulate with Cardiac rehab. If stable, plan for d/c later today.    LOS: 1 day    Brittainy M. Ladoris Gene 11/06/2015 6:29 AM  Personally seen and examined. Agree with above.  70 year old NSTEMI, severe LAD lesion, post PCI Dr. Tamala Julian EF 35% (stun hopefully) Radial left wrist looks good, lungs are clear. OK with DC Will continue to titrate Bb and Ace as outpt.  DAPT. Cardiac rehab.  Candee Furbish, MD

## 2015-11-06 NOTE — Progress Notes (Signed)
CARDIAC REHAB PHASE I   PRE:  Rate/Rhythm: 64 SR    BP: sitting 110/68    SaO2:   MODE:  Ambulation: 250 ft   POST:  Rate/Rhythm: 78 SR    BP: sitting 126/83     SaO2:   Pt fairly steady. Able to walk without CP or diaphoresis (sx over past month) but she did st she was tired toward end. Ed completed with good reception. She is depressed due to her physical issues recently and also family issues. Understands importance of Brilinta. Will send referral for CRPII to [place]. She will need PT for her wrist first.  [place], ACSM 11/06/2015 9:58 AM

## 2015-11-06 NOTE — Discharge Summary (Signed)
Discharge Summary    Patient ID: Angela Bond,  MRN: [id number], DOB/AGE: Jun 29, 1945 70 y.o.  Admit date: 11/05/2015 Discharge date: 11/06/2015  Primary Care Provider: Gaynelle Arabian R Primary Cardiologist: Dr. Marlou Porch  Discharge Diagnoses    Active Problems:   Atrial fibrillation [facility])   Hypertension   Chest pressure   ACS (acute coronary syndrome) [facility])   NSTEMI (non-ST elevated myocardial infarction) (HCC)   Allergies Allergies  Allergen Reactions  . Shellfish Allergy Anaphylaxis    ANAPHYLAXIS TO OYSTERS  . Lactose Intolerance (Gi) Other (See Comments)  . Aleve [Naproxen] Nausea And Vomiting  . Aspirin Other (See Comments)    Burns stomach  . Ciprofloxacin Nausea And Vomiting  . Doxycycline Nausea And Vomiting  . Evista [Raloxifene] Other (See Comments)    Leg aches  . Fosamax [Alendronate Sodium] Other (See Comments)    Leg aches  . Pravachol [Pravastatin] Other (See Comments)    GI upset   . Sulfa Antibiotics Nausea Only  . Welchol [Colesevelam Hcl] Other (See Comments)    Stomach cramps  . Zocor [Simvastatin] Rash    Diagnostic Studies/Procedures    Procedures    Coronary Stent Intervention   Left Heart Cath and Coronary Angiography    Conclusion    1. Prox RCA to Mid RCA lesion, 30% stenosed. 2. Mid LAD lesion, 99% stenosed. Post intervention, there is a 5% residual stenosis. 3. Ost 2nd Diag lesion, 35% stenosed.   Acute coronary syndrome caused by acute/subacute thrombotic obstruction of the proximal LAD forming a [place] 111 bifurcation lesion with the large diagonal #1.  Luminal irregularities are noted in the circumflex and right coronary.  Severe anteroapical hypokinesis with estimated ejection fraction 35-40%. LV end-diastolic pressure is normal.  Successful PTCA and stent of the LAD thrombus containing lesion with reduction in the 95% stenosis to 0% with TIMI grade 3 flow using a drug-eluting stent postdilated to 3.5 mm in  diameter. The diagonal branch though "jailed" ended with less than 40% ostial obstruction.      History of Present Illness     70 year old female with previous 30% LAD lesion who presented 11/05/15 with nausea, vomiting 2, chest discomfort with elevated troponin and dynamic EKG changes. Admitted for NSTEMI.   Hospital Course   Patient was admitted to telemetry. Her troponin peaked at 3.12. Subsequently she was referred for [facility]. Procedure was performed by Dr. Tamala Julian on 11/05/15. Access was obtained via the left radial artery. She was found to have acute/subacute thrombotic obstruction of the proximal LAD forming a [place] 111 bifurcation lesion with the large diagonal #1. The lesion was 99% stenosed. She underwent successful PCI + stenting of the LAD. There was residual 30% prox to mid RCA lesion and a 35% stenosed Ost 2nd diag. EF was moderatetly impaired at 35-40%. It was felt that her left ventricular function will significantly improved and of the wall motion abnormality is related to "stunned" myocardium. She left the cath lab in stable condition. She ws placed on DAPT with ASA + Brilinta. She was also placed on an ACE-I, BB and statin. She had no recurrent CP and no post cath complications. Cath site and renal function remained stable as well as vital signs. No difficulties ambulating with cardiac rehab. She was last seen and examined by Dr. Marlou Porch who determined she was stable for discharge home.    Consultants: none   Discharge Vitals Blood pressure 124/63, pulse 65, temperature 97.7 F (36.5 C), temperature source Oral, resp.  rate 16, height 5' (1.524 m), weight 177 lb 14.6 oz (80.7 kg), SpO2 99 %.  Filed Weights   11/05/15 1051 11/06/15 0425  Weight: 165 lb (74.844 kg) 177 lb 14.6 oz (80.7 kg)    Labs & Radiologic Studies    CBC  Recent Labs  11/05/15 1725 11/06/15 0519  WBC 7.6 7.0  HGB 11.2* 11.3*  HCT 35.0* 33.9*  MCV 90.7 92.4  PLT 167 [id number]   Basic Metabolic  Panel  Recent Labs  11/05/15 1045 11/05/15 1725 11/06/15 0519  NA 138  --  137  K 3.7  --  4.5  CL 102  --  104  CO2 26  --  27  GLUCOSE 111*  --  92  BUN 7  --  8  CREATININE 1.03* 0.98 1.05*  CALCIUM 9.3  --  8.8*   Liver Function Tests  Recent Labs  11/05/15 1045  AST 25  ALT 14  ALKPHOS 78  BILITOT 0.6  PROT 7.8  ALBUMIN 3.7   No results for input(s): LIPASE, AMYLASE in the last 72 hours. Cardiac Enzymes  Recent Labs  11/05/15 1725 11/05/15 2330 11/06/15 0519  TROPONINI 1.87* 3.12* 2.24*   BNP Invalid input(s): POCBNP D-Dimer No results for input(s): DDIMER in the last 72 hours. Hemoglobin A1C  Recent Labs  11/05/15 1725  HGBA1C 5.5   Fasting Lipid Panel  Recent Labs  11/06/15 0519  CHOL 158  HDL 47  LDLCALC 88  TRIG 113  CHOLHDL 3.4   Thyroid Function Tests No results for input(s): TSH, T4TOTAL, T3FREE, THYROIDAB in the last 72 hours.  Invalid input(s): FREET3 _____________  Dg Chest 2 View  11/05/2015  CLINICAL DATA:  Chest pain and shortness of breath. EXAM: CHEST  2 VIEW COMPARISON:  10/21/2014 FINDINGS: The heart size and mediastinal contours are within normal limits. Both lungs are clear. Spondylosis noted within the thoracic spine. IMPRESSION: No active cardiopulmonary disease. Electronically Signed   By: Kerby Moors M.D.   On: 11/05/2015 11:30   Disposition   Pt is being discharged home today in good condition.  Follow-up Plans & Appointments    Follow-up Information    Follow up with Almyra Deforest, PA On 11/24/2015.   Specialties:  Cardiology, Radiology   Why:  1:30 PM (Dr. Kingsley Plan PA) hospital follow-up    Contact information:   [place] [address] [phone]      Discharge Instructions    Diet - low sodium heart healthy    Complete by:  As directed      Increase activity slowly    Complete by:  As directed            Discharge Medications   Current Discharge Medication List     START taking these medications   Details  aspirin EC 81 MG EC tablet Take 1 tablet (81 mg total) by mouth daily.    lisinopril (PRINIVIL,ZESTRIL) 2.5 MG tablet Take 1 tablet (2.5 mg total) by mouth daily. Qty: 30 tablet, Refills: 5    metoprolol tartrate (LOPRESSOR) 25 MG tablet Take 0.5 tablets (12.5 mg total) by mouth 2 (two) times daily. Qty: 30 tablet, Refills: 5    nitroGLYCERIN (NITROSTAT) 0.4 MG SL tablet Place 1 tablet (0.4 mg total) under the tongue every 5 (five) minutes as needed for chest pain (CP or SOB). Qty: 25 tablet, Refills: 2    !! ticagrelor (BRILINTA) 90 MG TABS tablet Take 1 tablet (90 mg  total) by mouth 2 (two) times daily. Qty: 60 tablet, Refills: 10    !! ticagrelor (BRILINTA) 90 MG TABS tablet Take 1 tablet (90 mg total) by mouth 2 (two) times daily. Qty: 60 tablet, Refills: 0     !! - Potential duplicate medications found. Please discuss with provider.    CONTINUE these medications which have NOT CHANGED   Details  calcium carbonate (OS-CAL) 600 MG TABS Take 600 mg by mouth 2 (two) times daily with a meal.    carboxymethylcellulose (REFRESH PLUS) 0.5 % SOLN Place 1 drop into both eyes every 3 (three) hours.     Cholecalciferol (VITAMIN D) 2000 UNITS tablet Take 2,000 Units by mouth 2 (two) times daily.     dexlansoprazole (DEXILANT) 60 MG capsule Take 60 mg by mouth daily.    fexofenadine (ALLEGRA) 180 MG tablet Take 180 mg by mouth daily.    FLUoxetine (PROZAC) 20 MG capsule Take 20 mg by mouth daily. Refills: 11    gabapentin (NEURONTIN) 300 MG capsule Take 1 capsule (300 mg total) by mouth 3 (three) times daily. Qty: 90 capsule, Refills: 5    LORazepam (ATIVAN) 1 MG tablet Take 1 mg at bedtime and prior to radiation as directed and as needed for anxiety. Qty: 60 tablet, Refills: 1   Associated Diagnoses: Breast cancer of upper-outer quadrant of right female breast ([place])    lovastatin (MEVACOR) 40 MG tablet Take 40 mg by mouth at bedtime.      meclizine (ANTIVERT) 25 MG tablet Take 12.5-25 mg by mouth 3 (three) times daily as needed for dizziness.    sodium chloride (OCEAN) 0.65 % SOLN nasal spray Place 2 sprays into both nostrils as needed for congestion.      STOP taking these medications     meloxicam (MOBIC) 15 MG tablet          Aspirin prescribed at discharge?  Yes High Intensity Statin Prescribed? (Lipitor 40-80mg  or Crestor 20-40mg ): Yes Beta Blocker Prescribed? Yes For EF <40%, was ACEI/ARB Prescribed? Yes ADP Receptor Inhibitor Prescribed? (i.e. Plavix etc.-Includes Medically Managed Patients): Yes For EF <40%, Aldosterone Inhibitor Prescribed? No:  Was EF assessed during THIS hospitalization? Yes Was Cardiac Rehab II ordered? (Included Medically managed Patients): yes   Outstanding Labs/Studies   none  Duration of Discharge Encounter   Greater than 30 minutes including physician time.  Signed, Lyda Jester PA-C 11/06/2015, 9:79 AM     70 year old NSTEMI, severe LAD lesion, post PCI Dr. Tamala Julian EF 35% (stun hopefully) Radial left wrist looks good, lungs are clear. OK with DC Will continue to titrate Bb and Ace as outpt.  DAPT. Cardiac rehab.  Candee Furbish, MD

## 2015-11-06 NOTE — Care Management Note (Signed)
Case Management Note  Patient Details  Name: SHELENE HOGLEN MRN: [id number] Date of Birth: [birth year]  Subjective/Objective:                    Action/Plan: Discharge Planning: AVS reviewed: PCP Gaynelle Arabian MD  NCM spoke to pt and husband, Bill at bedside. Provided pt with Brilinta 30 day free trial card. Pt will follow up with [facility] to receive medication via mail order. Explained [facility]. She will need to take trial card to pharmacy to get 30 days for free. Husband available to assist pt at home.    Expected Discharge Date:  11/06/2015               Expected Discharge Plan:  Home/Self Care  In-House Referral:  NA  Discharge planning Services  CM Consult  Post Acute Care Choice:  NA Choice offered to:  NA  DME Arranged:  N/A (has RW at home) DME Agency:  NA  HH Arranged:  NA HH Agency:  NA  Status of Service:  Completed, signed off  If discussed at [place] of Stay Meetings, dates discussed:    Additional Comments:  Erenest Rasher, RN 11/06/2015, 12:21 PM

## 2015-11-10 ENCOUNTER — Emergency Department (HOSPITAL_COMMUNITY): Payer: Medicare Other

## 2015-11-10 ENCOUNTER — Encounter (HOSPITAL_COMMUNITY): Payer: Self-pay | Admitting: Vascular Surgery

## 2015-11-10 ENCOUNTER — Emergency Department (HOSPITAL_COMMUNITY)
Admission: EM | Admit: 2015-11-10 | Discharge: 2015-11-10 | Disposition: A | Discharge status: Home / Self Care | Payer: Medicare Other | Attending: Emergency Medicine | Admitting: Emergency Medicine

## 2015-11-10 DIAGNOSIS — Z96653 Presence of artificial knee joint, bilateral: Secondary | ICD-10-CM | POA: Diagnosis not present

## 2015-11-10 DIAGNOSIS — Z79899 Other long term (current) drug therapy: Secondary | ICD-10-CM | POA: Insufficient documentation

## 2015-11-10 DIAGNOSIS — I2510 Atherosclerotic heart disease of native coronary artery without angina pectoris: Secondary | ICD-10-CM | POA: Diagnosis not present

## 2015-11-10 DIAGNOSIS — R079 Chest pain, unspecified: Secondary | ICD-10-CM | POA: Insufficient documentation

## 2015-11-10 DIAGNOSIS — E7800 Pure hypercholesterolemia, unspecified: Secondary | ICD-10-CM | POA: Diagnosis not present

## 2015-11-10 DIAGNOSIS — Z91040 Latex allergy status: Secondary | ICD-10-CM | POA: Diagnosis not present

## 2015-11-10 DIAGNOSIS — Z853 Personal history of malignant neoplasm of breast: Secondary | ICD-10-CM | POA: Insufficient documentation

## 2015-11-10 DIAGNOSIS — R072 Precordial pain: Secondary | ICD-10-CM | POA: Insufficient documentation

## 2015-11-10 DIAGNOSIS — R0789 Other chest pain: Secondary | ICD-10-CM

## 2015-11-10 DIAGNOSIS — K219 Gastro-esophageal reflux disease without esophagitis: Secondary | ICD-10-CM | POA: Diagnosis not present

## 2015-11-10 DIAGNOSIS — I10 Essential (primary) hypertension: Secondary | ICD-10-CM | POA: Diagnosis not present

## 2015-11-10 DIAGNOSIS — Z7982 Long term (current) use of aspirin: Secondary | ICD-10-CM | POA: Diagnosis not present

## 2015-11-10 LAB — CBC
HCT: 34 % — ABNORMAL LOW (ref 36.0–46.0)
Hemoglobin: 10.9 g/dL — ABNORMAL LOW (ref 12.0–15.0)
MCH: 28.8 pg (ref 26.0–34.0)
MCHC: 32.1 g/dL (ref 30.0–36.0)
MCV: 89.7 fL (ref 78.0–100.0)
PLATELETS: 198 10*3/uL (ref 150–400)
RBC: 3.79 MIL/uL — AB (ref 3.87–5.11)
RDW: 13.5 % (ref 11.5–15.5)
WBC: 10.2 10*3/uL (ref 4.0–10.5)

## 2015-11-10 LAB — BASIC METABOLIC PANEL
Anion gap: 8 (ref 5–15)
BUN: 9 mg/dL (ref 6–20)
CALCIUM: 8.8 mg/dL — AB (ref 8.9–10.3)
CO2: 26 mmol/L (ref 22–32)
CREATININE: 0.95 mg/dL (ref 0.44–1.00)
Chloride: 103 mmol/L (ref 101–111)
GFR calc non Af Amer: 59 mL/min — ABNORMAL LOW (ref >60)
Glucose, Bld: 120 mg/dL — ABNORMAL HIGH (ref 65–99)
Potassium: 3.7 mmol/L (ref 3.5–5.1)
SODIUM: 137 mmol/L (ref 135–145)

## 2015-11-10 LAB — I-STAT TROPONIN, ED: TROPONIN I, POC: 0.12 ng/mL — AB (ref 0.00–0.08)

## 2015-11-10 LAB — TROPONIN I: TROPONIN I: 0.08 ng/mL — AB (ref <0.03)

## 2015-11-10 MED ORDER — SIMETHICONE 40 MG/0.6ML PO SUSP
40.0000 mg | Freq: Once | ORAL | Status: AC
  Administered 2015-11-10: 40 mg via ORAL
  Filled 2015-11-10 (×2): qty 0.6

## 2015-11-10 MED ORDER — FAMOTIDINE IN NACL 20-0.9 MG/50ML-% IV SOLN
20.0000 mg | Freq: Once | INTRAVENOUS | Status: AC
  Administered 2015-11-10: 20 mg via INTRAVENOUS
  Filled 2015-11-10: qty 50

## 2015-11-10 NOTE — ED Notes (Signed)
Called main pharm to send mylicon

## 2015-11-10 NOTE — ED Notes (Signed)
Re-paged [facility] x2 to [phone]

## 2015-11-10 NOTE — ED Notes (Signed)
Pt reports to the ED for eval of nausea and chest tightness. Also reports some associated lightheadedness and dizziness. She is from home. Symptoms began today. She was recently seen and had a heart catheterization and had stents placed in one of her main arteries. She reports this feels similar to her recent episode except for with the last episode she was diaphoretic as well. No diaphoresis at this time. She had 324 of ASA, 4 mg of Zofran, and 1 nitro PTA. 12 lead sent over en route as there was some elevation (less than 1 mm) in the inferior leads. Old and new 12 lead compared. No Code STEMI called. Pt also having some increased belching. Pt was at rest at pain onset. Pt A&Ox4, resp e/u, and skin warm and dry.

## 2015-11-10 NOTE — ED Notes (Signed)
Blood draw needed. Pt has IV in left hand so no blood can be drawn above. Right arm had lymph nodes removed, surgery done, and has an ortho device in place so this arm is restricted, therefore foot stick for blood needed. ED PA, Elmyra Ricks verbalized permission for a foot stick. Pt is not diabetic. Lab made aware and at bedside

## 2015-11-10 NOTE — Discharge Instructions (Signed)
Continue taking your home medications as prescribed. Follow-up with your cardiologist at your scheduled appointment for further management of your coronary artery disease and chest pain. Return to emergency department if symptoms worsen or new onset of fever, new/worsening chest pain, difficulty breathing, lightheadedness, dizziness, vomiting, numbness, tingling, weakness.

## 2015-11-10 NOTE — ED Provider Notes (Signed)
CSN: [id number]     Arrival date & time 11/10/15  1459 History   First MD Initiated Contact with Patient 11/10/15 1505     Chief Complaint  Patient presents with  . Chest Pain     (Consider location/radiation/quality/duration/timing/severity/associated sxs/prior Treatment) HPI   Angela Bond is a 70 year old female with past medical history of CAD, hypertension, GERD who presents the ED via EMS with complaint of chest pain, onset 30 minutes prior to arrival. Patient reports approximately 20 minutes after eating lunch this afternoon she began having midsternal/epigastric chest pressure with associated nausea. She also reports increased belching and notes that she has mild intermittent relief of chest pressure after belching. Denies fever, chills, headache, visual changes, shortness of breath, cough, palpitations, diaphoresis, abdominal pain, vomiting, diarrhea, urinary symptoms, numbness, tingling, weakness. EMS administered aspirin, one sublingual nitroglycerin and Zofran prior to arrival. Patient reports relief of nausea with Zofran. Patient reports she was recently admitted for chest pain and had a stent placed last week and notes she has been taking all of her medications as prescribed. Patient reports she has had mild intermittent lightheadedness since starting her new heart medications. She notes the lightheadedness worsened when changing the edition from sitting to standing or ambulating. Patient denies any syncopal episodes. She reports her chest pain today feels different from the chest pain she presented with last week. Patient reports history of GERD and notes her chest pain today feels similar to reflux discomfort she has had in the past.  Past Medical History  Diagnosis Date  . Hypertension   . GERD (gastroesophageal reflux disease)   . Hypercholesteremia   . Heart murmur   . Anxiety and depression   . H/O hiatal hernia   . Dysrhythmia   . Shortness of breath     "stomach is in lungs" when  had hernia 2014 none now(07/28/14)  . Depression   . Anxiety   . Shingles 5/15    hx  . Breast cancer, right ([place]) 3/16  . Coronary artery disease   . Pneumonia X 1  . Anemia     hx  . History of blood transfusion     "when taking chemo & w/one of my knee replacements" (11/05/2015)  . Sinus headache   . Arthritis     "~ all over my body; in the joints"   Past Surgical History  Procedure Laterality Date  . Total knee arthroplasty Bilateral 2011,2012  . Esophageal manometry N/A 07/02/2012    Procedure: ESOPHAGEAL MANOMETRY (EM);  Surgeon: Lear Ng, MD;  Location: [facility];  Service: Endoscopy;  Laterality: N/A;  schooler to read  . Tonsillectomy  as child  . Hiatal hernia repair N/A 08/10/2012    Procedure: LAPAROSCOPIC REPAIR OF HIATAL HERNIA WITH MESH AND EGD WITH PEG TUBE PLACEMENT;  Surgeon: Ralene Ok, MD;  Location: [facility] ORS;  Service: General;  Laterality: N/A;  . Joint replacement    . Eye surgery Left 2013    tube placed tear duct  . Breast lumpectomy with sentinel lymph node biopsy Right 09/22/2014    Procedure: RIGHT BREAST LUMPECTOMY WITH NEEDLE LOCALIZATION AND RIGHT AXILLARY SENTINEL LYMPH NODE BIOPSY;  Surgeon: [name], MD;  Location: [facility];  Service: General;  Laterality: Right;  . Portacath placement Left 09/22/2014    Procedure: INSERTION PORT-A-CATH;  Surgeon: [name], MD;  Location: [facility];  Service: General;  Laterality: Left;  . Cardiac catheterization    . Coronary angioplasty with stent placement  11/05/2015    "1 stent"  . Breast biopsy Right   . Hernia repair    . Carpal tunnel release Right 09/2015  . Port-a-cath removal  06/2015  . Back surgery    . Posterior lumbar fusion  2016    "had 2 vertebrae fused"  . Cardiac catheterization N/A 11/05/2015    Procedure: Left Heart Cath and Coronary Angiography;  Surgeon: Lyn Records, MD;  Location: [facility] INVASIVE CV LAB;  Service: Cardiovascular;  Laterality: N/A;  . Cardiac catheterization  N/A 11/05/2015    Procedure: Coronary Stent Intervention;  Surgeon: Lyn Records, MD;  Location: [facility] INVASIVE CV LAB;  Service: Cardiovascular;  Laterality: N/A;   Family History  Problem Relation Age of Onset  . Heart disease Mother     37s, CHF  . Heart disease Father     d/o MI at 33  . Cancer Father     skin  . Diabetes Father   . Cancer Sister     breast  . Diabetes Sister   . Heart disease Sister     CABG in mid-60s   Social History  Substance Use Topics  . Smoking status: Never Smoker   . Smokeless tobacco: Never Used  . Alcohol Use: No   OB History    No data available     Review of Systems  Cardiovascular: Positive for chest pain.  Gastrointestinal: Positive for nausea.  Neurological: Positive for light-headedness.  All other systems reviewed and are negative.     Allergies  Shellfish allergy; Lactose intolerance (gi); Penicillins; Aleve; Aspirin; Ciprofloxacin; Doxycycline; Evista; Fosamax; Latex; Naproxen sodium; Pravachol; Sulfa antibiotics; Tape; Welchol; and Zocor  Home Medications   Prior to Admission medications   Medication Sig Start Date End Date Taking? Authorizing Provider  acetaminophen (TYLENOL) 500 MG tablet Take 1,000 mg by mouth every 6 (six) hours as needed for mild pain or headache.   Yes Historical Provider, MD  amoxicillin-clavulanate (AUGMENTIN) 500-125 MG tablet Take 1 tablet by mouth 2 (two) times daily. 11/05/15  Yes Historical Provider, MD  aspirin EC 81 MG EC tablet Take 1 tablet (81 mg total) by mouth daily. 11/06/15  Yes Brittainy M Simmons, PA-C  calcium carbonate (OS-CAL) 600 MG TABS Take 600 mg by mouth 2 (two) times daily with a meal.   Yes Historical Provider, MD  carboxymethylcellulose (REFRESH PLUS) 0.5 % SOLN Place 1 drop into both eyes every 3 (three) hours.    Yes Historical Provider, MD  Cholecalciferol (VITAMIN D) 2000 UNITS tablet Take 2,000 Units by mouth 2 (two) times daily.    Yes Historical Provider, MD   dexlansoprazole (DEXILANT) 60 MG capsule Take 60 mg by mouth daily.   Yes Historical Provider, MD  fexofenadine (ALLEGRA) 180 MG tablet Take 180 mg by mouth daily.   Yes Historical Provider, MD  FLUoxetine (PROZAC) 20 MG capsule Take 20 mg by mouth daily. 06/24/14  Yes Historical Provider, MD  gabapentin (NEURONTIN) 300 MG capsule Take 1 capsule (300 mg total) by mouth 3 (three) times daily. 08/07/15  Yes Max T Hyatt, DPM  lisinopril (PRINIVIL,ZESTRIL) 2.5 MG tablet Take 1 tablet (2.5 mg total) by mouth daily. 11/06/15  Yes Brittainy M Simmons, PA-C  LORazepam (ATIVAN) 1 MG tablet Take 1 mg at bedtime and prior to radiation as directed and as needed for anxiety. Patient taking differently: Take 1 mg by mouth at bedtime.  01/29/15  Yes Lurline Hare, MD  lovastatin (MEVACOR) 40 MG tablet Take 40 mg  by mouth at bedtime.    Yes Historical Provider, MD  meclizine (ANTIVERT) 25 MG tablet Take 12.5-25 mg by mouth 3 (three) times daily as needed for dizziness.   Yes Historical Provider, MD  metoprolol tartrate (LOPRESSOR) 25 MG tablet Take 0.5 tablets (12.5 mg total) by mouth 2 (two) times daily. 11/06/15  Yes Brittainy Erie Noe, PA-C  nitroGLYCERIN (NITROSTAT) 0.4 MG SL tablet Place 1 tablet (0.4 mg total) under the tongue every 5 (five) minutes as needed for chest pain (CP or SOB). 11/06/15  Yes Brittainy Erie Noe, PA-C  sodium chloride (OCEAN) 0.65 % SOLN nasal spray Place 2 sprays into both nostrils as needed for congestion.   Yes Historical Provider, MD  ticagrelor (BRILINTA) 90 MG TABS tablet Take 1 tablet (90 mg total) by mouth 2 (two) times daily. 11/06/15  Yes Brittainy Erie Noe, PA-C  nitrofurantoin (MACRODANTIN) 100 MG capsule Take 100 mg by mouth at bedtime. 11/10/15   Historical Provider, MD  ticagrelor (BRILINTA) 90 MG TABS tablet Take 1 tablet (90 mg total) by mouth 2 (two) times daily. Patient not taking: Reported on 11/10/2015 11/06/15   Brittainy M Simmons, PA-C   BP 109/69 mmHg  Pulse 57   Temp(Src) 98.9 F (37.2 C) (Oral)  Resp 15  SpO2 99% Physical Exam  Constitutional: She is oriented to person, place, and time. She appears well-developed and well-nourished. No distress.  HENT:  Head: Normocephalic and atraumatic.  Mouth/Throat: Oropharynx is clear and moist. No oropharyngeal exudate.  Eyes: Conjunctivae and EOM are normal. Pupils are equal, round, and reactive to light. Right eye exhibits no discharge. Left eye exhibits no discharge. No scleral icterus.  No nystagmus  Neck: Normal range of motion. Neck supple.  Cardiovascular: Normal rate, regular rhythm, normal heart sounds and intact distal pulses.   Pulmonary/Chest: Effort normal and breath sounds normal. No respiratory distress. She has no wheezes. She has no rales. She exhibits tenderness (mild chest discomfort with palpation over inferior midsternal chest wall).  Abdominal: Soft. Bowel sounds are normal. She exhibits no distension and no mass. There is tenderness (mild epigastric tenderness). There is no rebound and no guarding.  Musculoskeletal: Normal range of motion. She exhibits no edema.  Neurological: She is alert and oriented to person, place, and time.  Skin: Skin is warm and dry. She is not diaphoretic.  Nursing note and vitals reviewed.   ED Course  Procedures (including critical care time) Labs Review Labs Reviewed  BASIC METABOLIC PANEL - Abnormal; Notable for the following:    Glucose, Bld 120 (*)    Calcium 8.8 (*)    GFR calc non Af Amer 59 (*)    All other components within normal limits  CBC - Abnormal; Notable for the following:    RBC 3.79 (*)    Hemoglobin 10.9 (*)    HCT 34.0 (*)    All other components within normal limits  TROPONIN I - Abnormal; Notable for the following:    Troponin I 0.08 (*)    All other components within normal limits  I-STAT TROPOININ, ED - Abnormal; Notable for the following:    Troponin i, poc 0.12 (*)    All other components within normal limits     Imaging Review Dg Chest 2 View  11/10/2015  CLINICAL DATA:  Nausea, chest tightness EXAM: CHEST  2 VIEW COMPARISON:  11/05/2015 FINDINGS: Cardiomediastinal silhouette is stable. Surgical clips in right axilla again noted. No acute infiltrate or pleural effusion. No pulmonary edema. Mild  degenerative changes lower thoracic spine. IMPRESSION: No active cardiopulmonary disease. Electronically Signed   By: Lahoma Crocker M.D.   On: 11/10/2015 16:13   I have personally reviewed and evaluated these images and lab results as part of my medical decision-making.   EKG Interpretation   Date/Time:  Tuesday November 10 2015 15:05:45 EDT Ventricular Rate:  63 PR Interval:    QRS Duration: 86 QT Interval:  432 QTC Calculation: 443 R Axis:   82 Text Interpretation:  Sinus rhythm Borderline right axis deviation Abnrm  T, probable ischemia, anterolateral lds ST elevation, consider inferior  injury EKG similar to prior EKG 11/06/2015 Confirmed by LIU MD, DANA  [phone]) on 11/10/2015 4:38:51 PM      MDM   Final diagnoses:  Chest pain, unspecified chest pain type    Angela Bond presents with CP with associated nausea, lightheadedness and belching. History of CAD, hypertension, GERD and right breast cancer. Patient was admitted last week for an STEMI. She reports her chest pain today feels different than the chest pain she had last week and reports feeling similar to reflux she has had in the past. VSS. Exam revealed mild chest tenderness over inferior midsternal chest wall and epigastric region, remaining exam unremarkable. Patient given IV Pepcid in the ED.  Chart review shows patient was seen in the ED on 6/22 and admitted for an NSTEMI. Heart cath performed on 11/05/15 showed 30% occlusion of prox. RCA to mid RCA and mid LAD with 99% stenosis. Stent was placed in LAD. EF 35-40%. Angela Bond was started on ASA, Brilenta, statin, BB and ACEi. Cardiologist D. Skains. Angela Bond reports having a follow up appointment on 11/19/15.   EKG  showed T-wave abnormalities in the anterior lateral leads which appear to be consistent with changes seen in her EKG when she was seen in the ED on 6/22 for an NSTEMI. Troponin 0.12. Remaining labs unremarkable. Chest x-ray negative. Consult to cardiology. Dr. Debara Pickett reports they will come evaluate the patient in the ED. Cardiology recommends that delta troponin and GI cocktail. Delta troponin decreased to 0.08. Patient reports her chest pain has improved status post GI cocktail. Plan to discharge patient home with cardiology follow-up. Discussed results and plan for discharge with patient. Discussed return precautions with patient.  Chesley Noon McDonald, [place] 11/10/15 2226  Forde Dandy, MD 11/11/15 [phone]

## 2015-11-10 NOTE — ED Notes (Signed)
Pt stated on her way out that she hates [employer] and she will never be returning. Not please with disposition.

## 2015-11-10 NOTE — Consult Note (Signed)
Patient ID: Angela Bond MRN: [id number], DOB/AGE: [id number]   Admit date: 11/10/2015   Primary Physician: Simona Huh, MD Primary Cardiologist: Dr. Marlou Porch  Pt. Profile:  Angela Bond is a 70 y.o. female with a history PAF, HTN, HLD and breast CA s/p chemo and radiation therapy, recent admission of NSTEMI of who presented to [facility] ED for evaluation of chest pain.   HPI: As above. Admitted 6/22-6/23 with NSTEMI. Her troponin peaked at 3.12. Subsequently she was referred for [facility]. Procedure was performed by Dr. Tamala Julian on 11/05/15. Access was obtained via the left radial artery. She was found to have acute/subacute thrombotic obstruction of the proximal LAD forming a Medina 111 bifurcation lesion with the large diagonal #1. The lesion was 99% stenosed. She underwent successful PCI + stenting of the LAD. There was residual 30% prox to mid RCA lesion and a 35% stenosed Ost 2nd diag. EF was moderatetly impaired at 35-40%. It was felt that her left ventricular function will significantly improved and of the wall motion abnormality is related to "stunned" myocardium. She ws placed on DAPT with ASA + Brilinta. She was also placed on an ACE-I, BB and statin. She had no recurrent CP and no post cath complications. Discharged home in stable condition. At discharge her troponin was 2.24.   Since discharge patient had a intermittent episode of dizziness. No syncope. Sunday 6/25 she striped over at church and fell forward. No prodromes. Has left ankle bruise which is stable. She at chicken in lunch today and soon after that she had a episode of upset stomach. Followed by severe nausea but no vomiting leading to chest tightness and lightheadedness. No diaphoresis or shortness of breath. This episode is different from recent non-STEMI, feels like worsening GERD. Episode lasted for about 20-30 minutes and EMS was called. Given one sublingual nitroglycerin, aspirin 324 mg and Zofran. Upon arrival to ER she  is minimally hypotensive. Given IV famotidine. Her chest tightness has resolved but continues to have upset stomach.  In ED point-of-care troponin 0.12. Hemoglobin of 10.9. Chest x-ray clear. EKG shows sinus rhythm at rate of 63 bpm, T-wave inversion in lead V1 and aVL, minimal ST elevation in lead 3 and aVF which appears similar to prior admission. No acute changes.  Problem List  Past Medical History  Diagnosis Date  . Hypertension   . GERD (gastroesophageal reflux disease)   . Hypercholesteremia   . Heart murmur   . Anxiety and depression   . H/O hiatal hernia   . Dysrhythmia   . Shortness of breath     "stomach is in lungs" when had hernia 2014 none now(07/28/14)  . Depression   . Anxiety   . Shingles 5/15    hx  . Breast cancer, right ([place]) 3/16  . Coronary artery disease   . Pneumonia X 1  . Anemia     hx  . History of blood transfusion     "when taking chemo & w/one of my knee replacements" (11/05/2015)  . Sinus headache   . Arthritis     "~ all over my body; in the joints"    Past Surgical History  Procedure Laterality Date  . Total knee arthroplasty Bilateral 2011,2012  . Esophageal manometry N/A 07/02/2012    Procedure: ESOPHAGEAL MANOMETRY (EM);  Surgeon: Lear Ng, MD;  Location: [facility];  Service: Endoscopy;  Laterality: N/A;  schooler to read  . Tonsillectomy  as child  . Hiatal hernia repair  N/A 08/10/2012    Procedure: LAPAROSCOPIC REPAIR OF HIATAL HERNIA WITH MESH AND EGD WITH PEG TUBE PLACEMENT;  Surgeon: Ralene Ok, MD;  Location: [facility];  Service: General;  Laterality: N/A;  . Joint replacement    . Eye surgery Left 2013    tube placed tear duct  . Breast lumpectomy with sentinel lymph node biopsy Right 09/22/2014    Procedure: RIGHT BREAST LUMPECTOMY WITH NEEDLE LOCALIZATION AND RIGHT AXILLARY SENTINEL LYMPH NODE BIOPSY;  Surgeon: [name], MD;  Location: [place];  Service: General;  Laterality: Right;  . Portacath placement Left  09/22/2014    Procedure: INSERTION PORT-A-CATH;  Surgeon: [name], MD;  Location: [place];  Service: General;  Laterality: Left;  . Cardiac catheterization    . Coronary angioplasty with stent placement  11/05/2015    "1 stent"  . Breast biopsy Right   . Hernia repair    . Carpal tunnel release Right 09/2015  . Port-a-cath removal  06/2015  . Back surgery    . Posterior lumbar fusion  2016    "had 2 vertebrae fused"  . Cardiac catheterization N/A 11/05/2015    Procedure: Left Heart Cath and Coronary Angiography;  Surgeon: Belva Crome, MD;  Location: [place] [facility];  Service: Cardiovascular;  Laterality: N/A;  . Cardiac catheterization N/A 11/05/2015    Procedure: Coronary Stent Intervention;  Surgeon: Belva Crome, MD;  Location: [place] [facility];  Service: Cardiovascular;  Laterality: N/A;     Allergies  Allergies  Allergen Reactions  . Shellfish Allergy Anaphylaxis    ANAPHYLAXIS TO OYSTERS  . Lactose Intolerance (Gi) Other (See Comments)  . Penicillins Nausea And Vomiting    Has patient had a PCN reaction causing immediate rash, facial/tongue/throat swelling, SOB or lightheadedness with hypotension: Yes Has patient had a PCN reaction causing severe rash involving mucus membranes or skin necrosis: No Has patient had a PCN reaction that required hospitalization No Has patient had a PCN reaction occurring within the last 10 years: No If all of the above answers are "NO", then may proceed with Cephalosporin use.   [name] [Naproxen] Nausea And Vomiting  . Aspirin Other (See Comments)    Burns stomach  . Ciprofloxacin Nausea And Vomiting  . Doxycycline Nausea And Vomiting  . Evista [Raloxifene] Other (See Comments)    LEG ACHES  . Fosamax [Alendronate Sodium] Other (See Comments)    Leg aches  . Latex Rash  . Naproxen Sodium Nausea And Vomiting  . Pravachol [Pravastatin] Other (See Comments)    GI upset   . Sulfa Antibiotics Nausea Only  . Tape Rash  . Welchol  [Colesevelam Hcl] Other (See Comments)    Stomach cramps  . Zocor [Simvastatin] Rash     Home Medications  Prior to Admission medications   Medication Sig Start Date End Date Taking? Authorizing Provider  acetaminophen (TYLENOL) 500 MG tablet Take 1,000 mg by mouth every 6 (six) hours as needed for mild pain or headache.   Yes Historical Provider, MD  amoxicillin-clavulanate (AUGMENTIN) 500-125 MG tablet Take 1 tablet by mouth 2 (two) times daily. 11/05/15  Yes Historical Provider, MD  aspirin EC 81 MG EC tablet Take 1 tablet (81 mg total) by mouth daily. 11/06/15  Yes Brittainy Erie Noe, PA-C  calcium carbonate (OS-CAL) 600 MG TABS Take 600 mg by mouth 2 (two) times daily with a meal.   Yes Historical Provider, MD  carboxymethylcellulose (REFRESH PLUS) 0.5 % SOLN Place  1 drop into both eyes every 3 (three) hours.    Yes Historical Provider, MD  Cholecalciferol (VITAMIN D) 2000 UNITS tablet Take 2,000 Units by mouth 2 (two) times daily.    Yes Historical Provider, MD  dexlansoprazole (DEXILANT) 60 MG capsule Take 60 mg by mouth daily.   Yes Historical Provider, MD  fexofenadine (ALLEGRA) 180 MG tablet Take 180 mg by mouth daily.   Yes Historical Provider, MD  FLUoxetine (PROZAC) 20 MG capsule Take 20 mg by mouth daily. 06/24/14  Yes Historical Provider, MD  gabapentin (NEURONTIN) 300 MG capsule Take 1 capsule (300 mg total) by mouth 3 (three) times daily. 08/07/15  Yes Max T Hyatt, DPM  lisinopril (PRINIVIL,ZESTRIL) 2.5 MG tablet Take 1 tablet (2.5 mg total) by mouth daily. 11/06/15  Yes Brittainy M Simmons, PA-C  LORazepam (ATIVAN) 1 MG tablet Take 1 mg at bedtime and prior to radiation as directed and as needed for anxiety. Patient taking differently: Take 1 mg by mouth at bedtime.  01/29/15  Yes Thea Silversmith, MD  lovastatin (MEVACOR) 40 MG tablet Take 40 mg by mouth at bedtime.    Yes Historical Provider, MD  meclizine (ANTIVERT) 25 MG tablet Take 12.5-25 mg by mouth 3 (three) times daily as  needed for dizziness.   Yes Historical Provider, MD  metoprolol tartrate (LOPRESSOR) 25 MG tablet Take 0.5 tablets (12.5 mg total) by mouth 2 (two) times daily. 11/06/15  Yes Brittainy Erie Noe, PA-C  nitroGLYCERIN (NITROSTAT) 0.4 MG SL tablet Place 1 tablet (0.4 mg total) under the tongue every 5 (five) minutes as needed for chest pain (CP or SOB). 11/06/15  Yes Brittainy Erie Noe, PA-C  sodium chloride (OCEAN) 0.65 % SOLN nasal spray Place 2 sprays into both nostrils as needed for congestion.   Yes Historical Provider, MD  ticagrelor (BRILINTA) 90 MG TABS tablet Take 1 tablet (90 mg total) by mouth 2 (two) times daily. 11/06/15  Yes Brittainy Erie Noe, PA-C  nitrofurantoin (MACRODANTIN) 100 MG capsule Take 100 mg by mouth at bedtime. 11/10/15   Historical Provider, MD  ticagrelor (BRILINTA) 90 MG TABS tablet Take 1 tablet (90 mg total) by mouth 2 (two) times daily. Patient not taking: Reported on 11/10/2015 11/06/15   Consuelo Pandy, PA-C    Family History  Family History  Problem Relation Age of Onset  . Heart disease Mother     54s, CHF  . Heart disease Father     d/o MI at 74  . Cancer Father     skin  . Diabetes Father   . Cancer Sister     breast  . Diabetes Sister   . Heart disease Sister     CABG in mid-38s   Family Status  Relation Status Death Age  . Mother Deceased   . Father Deceased   . Sister Deceased   . Sister Alive      Social History  Social History   Social History  . Marital Status: Married    Spouse Name: N/A  . Number of Children: 1  . Years of Education: N/A   Occupational History  . Not on file.   Social History Main Topics  . Smoking status: Never Smoker   . Smokeless tobacco: Never Used  . Alcohol Use: No  . Drug Use: No  . Sexual Activity: No   Other Topics Concern  . Not on file   Social History Narrative   Lives with husband.  Does not use assist device.  All other systems reviewed and are otherwise negative except  as noted above.  Physical Exam  Blood pressure 97/58, pulse 60, temperature 98.9 F (37.2 C), temperature source Oral, resp. rate 14, SpO2 98 %.  General: Pleasant, NAD Psych: Normal affect. Neuro: Alert and oriented X 3. Moves all extremities spontaneously. HEENT: Normal  Neck: Supple without bruits or JVD. Lungs:  Resp regular and unlabored, CTA. Heart: RRR no s3, s4, or murmurs. Abdomen: Soft, diffuse tenderness to palpation mostly at epigastric area BS + x 4.  Extremities: No clubbing, cyanosis or edema. DP/PT/Radials 2+ and equal bilaterally.  Labs  No results for input(s): CKTOTAL, CKMB, TROPONINI in the last 72 hours. Lab Results  Component Value Date   WBC 10.2 11/10/2015   HGB 10.9* 11/10/2015   HCT 34.0* 11/10/2015   MCV 89.7 11/10/2015   PLT 198 11/10/2015    Recent Labs Lab 11/05/15 1045  11/10/15 1712  NA 138  < > 137  K 3.7  < > 3.7  CL 102  < > 103  CO2 26  < > 26  BUN 7  < > 9  CREATININE 1.03*  < > 0.95  CALCIUM 9.3  < > 8.8*  PROT 7.8  --   --   BILITOT 0.6  --   --   ALKPHOS 78  --   --   ALT 14  --   --   AST 25  --   --   GLUCOSE 111*  < > 120*  < > = values in this interval not displayed. Lab Results  Component Value Date   CHOL 158 11/06/2015   HDL 47 11/06/2015   LDLCALC 88 11/06/2015   TRIG 113 11/06/2015   No results found for: DDIMER   Radiology/Studies  Dg Chest 2 View  11/10/2015  CLINICAL DATA:  Nausea, chest tightness EXAM: CHEST  2 VIEW COMPARISON:  11/05/2015 FINDINGS: Cardiomediastinal silhouette is stable. Surgical clips in right axilla again noted. No acute infiltrate or pleural effusion. No pulmonary edema. Mild degenerative changes lower thoracic spine. IMPRESSION: No active cardiopulmonary disease. Electronically Signed   By: Lahoma Crocker M.D.   On: 11/10/2015 16:13   Dg Chest 2 View  11/05/2015  CLINICAL DATA:  Chest pain and shortness of breath. EXAM: CHEST  2 VIEW COMPARISON:  10/21/2014 FINDINGS: The heart size and  mediastinal contours are within normal limits. Both lungs are clear. Spondylosis noted within the thoracic spine. IMPRESSION: No active cardiopulmonary disease. Electronically Signed   By: Kerby Moors M.D.   On: 11/05/2015 11:30    Procedures    Coronary Stent Intervention   11/05/15   Left Heart Cath and Coronary Angiography    Conclusion    1. Prox RCA to Mid RCA lesion, 30% stenosed. 2. Mid LAD lesion, 99% stenosed. Post intervention, there is a 5% residual stenosis. 3. Ost 2nd Diag lesion, 35% stenosed.   Acute coronary syndrome caused by acute/subacute thrombotic obstruction of the proximal LAD forming a Medina 111 bifurcation lesion with the large diagonal #1.  Luminal irregularities are noted in the circumflex and right coronary.  Severe anteroapical hypokinesis with estimated ejection fraction 35-40%. LV end-diastolic pressure is normal.  Successful PTCA and stent of the LAD thrombus containing lesion with reduction in the 95% stenosis to 0% with TIMI grade 3 flow using a drug-eluting stent postdilated to 3.5 mm in diameter. The diagonal branch though "jailed" ended with less than 40% ostial obstruction.  RECOMMENDATIONS: 1. Probable discharge  in a.m. assuming no intercurrent complications. 2. Low dose Ace and beta blocker therapy were started due to LV dysfunction in the setting of acute coronary syndrome. Suspect the left ventricular function will significantly improved and of the wall motion abnormality is related to "stunned" myocardium. 3. High intensity statin therapy was ordered although the patient has many potential allergies/intolerances.     ECG  EKG shows sinus rhythm at rate of 63 bpm, T-wave inversion in lead V1 and aVL, minimal ST elevation in lead 3 and aVF which appears similar to prior admission.  ASSESSMENT AND PLAN  1. Chest pain - Atypical. Different from her recent non-STEMI. Seems more GI related and felt like worsening GERD. Symptoms minimally  improved with nitroglycerin as well as IV Pepcid and Zofran. Point-of-care troponin of 0.12 which is down from discharge troponin of 2.24. EKG without acute changes. Give GI cocktail. IF 2nd troponin negative, she can go home.   2. CAD - acute/subacute thrombotic obstruction of the proximal LAD forming a Medina 111 bifurcation lesion with the large diagonal #1. S/p successful PCI + stenting of the LAD - Continue DAPT with ASA + Brilinta, high intensity statin, BB + ACE-I.  3. HTN - minimally hypotensive after SL nitro x 1.   4. HLD - 11/06/2015: Cholesterol 158; HDL 47; LDL Cholesterol 88; Triglycerides 113; VLDL 23  - Continue statin  Signed, Alyissa Whidbee, PA-C 11/10/2015, 6:45 PM Pager [phone]

## 2015-11-19 ENCOUNTER — Encounter: Payer: Self-pay | Admitting: Physician Assistant

## 2015-11-19 ENCOUNTER — Encounter (INDEPENDENT_AMBULATORY_CARE_PROVIDER_SITE_OTHER): Payer: Self-pay

## 2015-11-19 ENCOUNTER — Ambulatory Visit (INDEPENDENT_AMBULATORY_CARE_PROVIDER_SITE_OTHER): Payer: Medicare Other | Admitting: Physician Assistant

## 2015-11-19 VITALS — BP 100/74 | HR 64 | Ht 60.0 in | Wt 162.8 lb

## 2015-11-19 DIAGNOSIS — I214 Non-ST elevation (NSTEMI) myocardial infarction: Secondary | ICD-10-CM

## 2015-11-19 DIAGNOSIS — I951 Orthostatic hypotension: Secondary | ICD-10-CM | POA: Diagnosis not present

## 2015-11-19 DIAGNOSIS — K219 Gastro-esophageal reflux disease without esophagitis: Secondary | ICD-10-CM

## 2015-11-19 DIAGNOSIS — I255 Ischemic cardiomyopathy: Secondary | ICD-10-CM | POA: Diagnosis not present

## 2015-11-19 NOTE — Patient Instructions (Signed)
Medication Instructions:  Your physician recommends that you continue on your current medications as directed. Please refer to the Current Medication list given to you today.  Labwork:   Testing/Procedures: NONE  Follow-Up:Your physician recommends that you schedule a follow-up appointment in: 2 MONTHS  WITH DR Marlou Porch   Any Other Special Instructions Will Be Listed Below (If Applicable).  CARDIAC REHAB   If you need a refill on your cardiac medications before your next appointment, please call your pharmacy.

## 2015-11-19 NOTE — Progress Notes (Signed)
Cardiology Office Note    Date:  11/19/2015   ID:  Angela Bond, DOB 03-Oct-1945, MRN [id number]  PCP:  Simona Huh, MD  Cardiologist: Dr. Marlou Porch  Chief Complaint  Patient presents with  . Hospitalization Follow-up    2 weeks    History of Present Illness:  Angela Bond is a 70 y.o. female  admitted with NSTEMI treated with DES to the mid LAD with residual 30% proximal and mid RCA, 35% ostial second diagonal. She had severe anterior apical hypokinesis EF 35-40%. The diagonal branch though jailed and it was less than 40% ostial obstruction. She was placed on DAPT with aspirin and Brilinta.  Patient returned to the emergency room with epigastric discomfort, belching and nausea after eating fried chicken. It was completely different from her previous anginal symptoms. Her troponin was elevated at 0.12 but at discharge was 2.24. Repeat troponin was 0.08. It was felt that her symptoms were GI and her troponins were trending down from her MI. Pain eased with GI cocktail.  Patient comes in today complaining of shortness of breath when walking up an incline at her house. She has no chest pain has to stop and rest before she continues on. She also gets dizzy when she stands up. Her blood pressure is low. She also got her foot stuck and fell flat on her face 2 weeks ago. She had no significant injury from this and denied syncope or presyncope associated with it. She has a lot of anxiety and depression about all the things she's gone through the past year including back surgery, breast CA, hand surgery and now CAD.    Past Medical History  Diagnosis Date  . Hypertension   . GERD (gastroesophageal reflux disease)   . Hypercholesteremia   . Heart murmur   . Anxiety and depression   . H/O hiatal hernia   . Dysrhythmia   . Shortness of breath     "stomach is in lungs" when had hernia 2014 none now(07/28/14)  . Depression   . Anxiety   . Shingles 5/15    hx  . Breast cancer, right  ([place]) 3/16  . Coronary artery disease   . Pneumonia X 1  . Anemia     hx  . History of blood transfusion     "when taking chemo & w/one of my knee replacements" (11/05/2015)  . Sinus headache   . Arthritis     "~ all over my body; in the joints"    Past Surgical History  Procedure Laterality Date  . Total knee arthroplasty Bilateral 2011,2012  . Esophageal manometry N/A 07/02/2012    Procedure: ESOPHAGEAL MANOMETRY (EM);  Surgeon: Lear Ng, MD;  Location: [facility];  Service: Endoscopy;  Laterality: N/A;  schooler to read  . Tonsillectomy  as child  . Hiatal hernia repair N/A 08/10/2012    Procedure: LAPAROSCOPIC REPAIR OF HIATAL HERNIA WITH MESH AND EGD WITH PEG TUBE PLACEMENT;  Surgeon: Ralene Ok, MD;  Location: [facility];  Service: General;  Laterality: N/A;  . Joint replacement    . Eye surgery Left 2013    tube placed tear duct  . Breast lumpectomy with sentinel lymph node biopsy Right 09/22/2014    Procedure: RIGHT BREAST LUMPECTOMY WITH NEEDLE LOCALIZATION AND RIGHT AXILLARY SENTINEL LYMPH NODE BIOPSY;  Surgeon: [name], MD;  Location: [facility];  Service: General;  Laterality: Right;  . Portacath placement Left 09/22/2014    Procedure: INSERTION PORT-A-CATH;  Surgeon:  [name], MD;  Location: [facility];  Service: General;  Laterality: Left;  . Cardiac catheterization    . Coronary angioplasty with stent placement  11/05/2015    "1 stent"  . Breast biopsy Right   . Hernia repair    . Carpal tunnel release Right 09/2015  . Port-a-cath removal  06/2015  . Back surgery    . Posterior lumbar fusion  2016    "had 2 vertebrae fused"  . Cardiac catheterization N/A 11/05/2015    Procedure: Left Heart Cath and Coronary Angiography;  Surgeon: Belva Crome, MD;  Location: [facility];  Service: Cardiovascular;  Laterality: N/A;  . Cardiac catheterization N/A 11/05/2015    Procedure: Coronary Stent Intervention;  Surgeon: Belva Crome, MD;  Location: [facility];  Service: Cardiovascular;  Laterality: N/A;    Current Medications: Outpatient Prescriptions Prior to Visit  Medication Sig Dispense Refill  . aspirin EC 81 MG EC tablet Take 1 tablet (81 mg total) by mouth daily.    . calcium carbonate (OS-CAL) 600 MG TABS Take 600 mg by mouth 2 (two) times daily with a meal.    . carboxymethylcellulose (REFRESH PLUS) 0.5 % SOLN Place 1 drop into both eyes every 3 (three) hours.     . Cholecalciferol (VITAMIN D) 2000 UNITS tablet Take 2,000 Units by mouth 2 (two) times daily.     Marland Kitchen dexlansoprazole (DEXILANT) 60 MG capsule Take 60 mg by mouth daily.    . fexofenadine (ALLEGRA) 180 MG tablet Take 180 mg by mouth daily.    Marland Kitchen FLUoxetine (PROZAC) 20 MG capsule Take 20 mg by mouth daily.  11  . gabapentin (NEURONTIN) 300 MG capsule Take 1 capsule (300 mg total) by mouth 3 (three) times daily. 90 capsule 5  . LORazepam (ATIVAN) 1 MG tablet Take 1 mg at bedtime and prior to radiation as directed and as needed for anxiety. (Patient taking differently: Take 1 mg by mouth at bedtime. ) 60 tablet 1  . lovastatin (MEVACOR) 40 MG tablet Take 40 mg by mouth at bedtime.     . meclizine (ANTIVERT) 25 MG tablet Take 12.5-25 mg by mouth 3 (three) times daily as needed for dizziness.    . metoprolol tartrate (LOPRESSOR) 25 MG tablet Take 0.5 tablets (12.5 mg total) by mouth 2 (two) times daily. 30 tablet 5  . nitrofurantoin (MACRODANTIN) 100 MG capsule Take 100 mg by mouth at bedtime.    . nitroGLYCERIN (NITROSTAT) 0.4 MG SL tablet Place 1 tablet (0.4 mg total) under the tongue every 5 (five) minutes as needed for chest pain (CP or SOB). 25 tablet 2  . sodium chloride (OCEAN) 0.65 % SOLN nasal spray Place 2 sprays into both nostrils as needed for congestion.    . ticagrelor (BRILINTA) 90 MG TABS tablet Take 1 tablet (90 mg total) by mouth 2 (two) times daily. 60 tablet 10  . lisinopril (PRINIVIL,ZESTRIL) 2.5 MG tablet Take 1 tablet (2.5 mg total) by mouth daily. (Patient not  taking: Reported on 11/19/2015) 30 tablet 5  . acetaminophen (TYLENOL) 500 MG tablet Take 1,000 mg by mouth every 6 (six) hours as needed for mild pain or headache.    Marland Kitchen amoxicillin-clavulanate (AUGMENTIN) 500-125 MG tablet Take 1 tablet by mouth 2 (two) times daily.    . ticagrelor (BRILINTA) 90 MG TABS tablet Take 1 tablet (90 mg total) by mouth 2 (two) times daily. (Patient not taking: Reported on 11/10/2015) 60 tablet 0  No facility-administered medications prior to visit.     Allergies:   Shellfish allergy; Lactose intolerance (gi); Penicillins; Aleve; Aspirin; Ciprofloxacin; Doxycycline; Evista; Fosamax; Latex; Naproxen sodium; Pravachol; Sulfa antibiotics; Tape; Welchol; and Zocor   Social History   Social History  . Marital Status: Married    Spouse Name: N/A  . Number of Children: 1  . Years of Education: N/A   Social History Main Topics  . Smoking status: Never Smoker   . Smokeless tobacco: Never Used  . Alcohol Use: No  . Drug Use: No  . Sexual Activity: No   Other Topics Concern  . None   Social History Narrative   Lives with husband.  Does not use assist device.       Family History:  The patient's    family history includes Cancer in her father and sister; Diabetes in her father and sister; Heart disease in her father, mother, and sister.   ROS:   Please see the history of present illness.    Review of Systems  Constitution: Positive for malaise/fatigue.  Cardiovascular: Positive for dyspnea on exertion.  Hematologic/Lymphatic: Bruises/bleeds easily.  Musculoskeletal: Positive for back pain and myalgias.  Neurological: Positive for dizziness and loss of balance.  Psychiatric/Behavioral: Positive for depression. The patient is nervous/anxious.    All other systems reviewed and are negative.   PHYSICAL EXAM:   VS:  BP 100/74 mmHg  Pulse 64  Ht 5' (1.524 m)  Wt 162 lb 12.8 oz (73.846 kg)  BMI 31.79 kg/m2  Physical Exam  GEN: Well nourished, well  developed, in no acute distress Neck: no JVD, carotid bruits, or masses Cardiac:RRR; no murmurs, rubs, or gallops  Respiratory:  clear to auscultation bilaterally, normal work of breathing GI: soft, nontender, nondistended, + BS Ext: Left arm without hematoma or hemorrhage at cath site good radial and brachial pulses lower extremities without cyanosis, clubbing, or edema, Good distal pulses bilaterally MS: no deformity or atrophy Skin: warm and dry, no rash Neuro:  Alert and Oriented x 3, Strength and sensation are intact Psych: euthymic mood, full affect  Wt Readings from Last 3 Encounters:  11/19/15 162 lb 12.8 oz (73.846 kg)  11/06/15 177 lb 14.6 oz (80.7 kg)  09/15/15 167 lb 12.8 oz (76.114 kg)      Studies/Labs Reviewed:   EKG:  EKG is not ordered today.    Recent Labs: 11/05/2015: ALT 14 11/10/2015: BUN 9; Creatinine, Ser 0.95; Hemoglobin 10.9*; Platelets 198; Potassium 3.7; Sodium 137   Lipid Panel    Component Value Date/Time   CHOL 158 11/06/2015 0519   TRIG 113 11/06/2015 0519   HDL 47 11/06/2015 0519   CHOLHDL 3.4 11/06/2015 0519   VLDL 23 11/06/2015 0519   LDLCALC 88 11/06/2015 0519    Additional studies/ records that were reviewed today include:   Diagnostic Studies/Procedures      Procedures      Coronary Stent Intervention      Left Heart Cath and Coronary Angiography       Conclusion      1. Prox RCA to Mid RCA lesion, 30% stenosed. 2. Mid LAD lesion, 99% stenosed. Post intervention, there is a 5% residual stenosis. 3. Ost 2nd Diag lesion, 35% stenosed.    Acute coronary syndrome caused by acute/subacute thrombotic obstruction of the proximal LAD forming a [place] 111 bifurcation lesion with the large diagonal #1.  Luminal irregularities are noted in the circumflex and right coronary.  Severe anteroapical hypokinesis with estimated  ejection fraction 35-40%. LV end-diastolic pressure is normal.  Successful PTCA and stent of the LAD thrombus  containing lesion with reduction in the 95% stenosis to 0% with TIMI grade 3 flow using a drug-eluting stent postdilated to 3.5 mm in diameter. The diagonal branch though "jailed" ended with less than 40% ostial obstruction.       ASSESSMENT:    1. Acute non-ST elevation myocardial infarction (NSTEMI) ([place])   2. Orthostatic hypotension   3. Gastroesophageal reflux disease, esophagitis presence not specified      PLAN:  In order of problems listed above:  Non-ST elevation MI with slow recovery and LV dysfunction recommend cardiac rehabilitation  Orthostatic hypotension patient's blood pressure is on the low side and when she stood up we had trouble getting her blood pressure but it stabilized quickly to 100/60. We'll stop lisinopril for now. Continue low-dose metoprolol. This is only other medication she is on that lowers her blood pressure. She is to call if she continues to have dizziness.  GE reflux improved since ER visit  Ischemic cardiomyopathy EF 35-40%. Patient have things dyspnea on exertion when going up a hill. I told her she needed time to recover from her MI. Recommend cardiac rehabilitation. Follow-up with Dr. Luther Parody in 2 months. Probably repeat 2-D echo in 3-4 months in hopes of recovery of her LV function.    Medication Adjustments/Labs and Tests Ordered: Current medicines are reviewed at length with the patient today.  Concerns regarding medicines are outlined above.  Medication changes, Labs and Tests ordered today are listed in the Patient Instructions below. Patient Instructions  Medication Instructions:  Your physician recommends that you continue on your current medications as directed. Please refer to the Current Medication list given to you today.  Labwork:   Testing/Procedures: NONE  Follow-Up:Your physician recommends that you schedule a follow-up appointment in: 2 MONTHS  WITH DR Marlou Porch   Any Other Special Instructions Will Be Listed Below (If  Applicable).  CARDIAC REHAB   If you need a refill on your cardiac medications before your next appointment, please call your pharmacy.       Sumner Boast, PA-C  11/19/2015 10:53 AM    [facility] [address], [place], [place]  [address] Phone: [phone]; Fax: [phone]

## 2015-11-24 ENCOUNTER — Ambulatory Visit: Payer: Medicare Other | Admitting: Physician Assistant

## 2015-11-24 DIAGNOSIS — M25521 Pain in right elbow: Secondary | ICD-10-CM | POA: Insufficient documentation

## 2015-12-04 ENCOUNTER — Telehealth: Payer: Self-pay | Admitting: Cardiology

## 2015-12-04 ENCOUNTER — Telehealth: Payer: Self-pay

## 2015-12-04 NOTE — Telephone Encounter (Signed)
Patient called in saying her [place] cost too much for her to pick up RX and she would like to have something cheaper. Callback number is correct on snapshot.

## 2015-12-04 NOTE — Telephone Encounter (Signed)
New message     Pt c/o medication issue:  1. Name of Medication: brilinta 2. How are you currently taking this medication (dosage and times per day)? 90mg   3. Are you having a reaction (difficulty breathing--STAT)?  no 4. What is your medication issue? Pt cannot afford brilinta.  She has will run out of medication on sunday

## 2015-12-04 NOTE — Telephone Encounter (Signed)
Pt states she got 30 days of Brilinta free but went to get refill and cannot afford to refill. Pt came to the office and got #6 bottles of 8 tablets each Brilinta 90mg . Pt asking if there is a less expensive medication she can take instead of Brilinta.  Pt advised I will forward to Dr Marlou Porch for review.  I will forward to Jenean Lindau, LPN to follow up with patient about any financial assistance for Brilinta.

## 2015-12-07 NOTE — Telephone Encounter (Signed)
Spoke with patient today. Requested a tier exception for Brilinta. Advised her if this is denied, we will pursue Patient Assist program.

## 2015-12-08 ENCOUNTER — Encounter: Payer: Self-pay | Admitting: Podiatry

## 2015-12-08 ENCOUNTER — Ambulatory Visit (INDEPENDENT_AMBULATORY_CARE_PROVIDER_SITE_OTHER): Payer: Medicare Other | Admitting: Podiatry

## 2015-12-08 ENCOUNTER — Ambulatory Visit (INDEPENDENT_AMBULATORY_CARE_PROVIDER_SITE_OTHER): Payer: Medicare Other

## 2015-12-08 DIAGNOSIS — M79674 Pain in right toe(s): Secondary | ICD-10-CM | POA: Diagnosis not present

## 2015-12-08 DIAGNOSIS — M2570 Osteophyte, unspecified joint: Secondary | ICD-10-CM

## 2015-12-08 DIAGNOSIS — M7751 Other enthesopathy of right foot: Secondary | ICD-10-CM

## 2015-12-08 DIAGNOSIS — M898X7 Other specified disorders of bone, ankle and foot: Secondary | ICD-10-CM

## 2015-12-08 DIAGNOSIS — M779 Enthesopathy, unspecified: Principal | ICD-10-CM

## 2015-12-08 DIAGNOSIS — M778 Other enthesopathies, not elsewhere classified: Secondary | ICD-10-CM

## 2015-12-09 NOTE — Progress Notes (Signed)
She presents today with chief complaint of pain about the first metatarsophalangeal joint and the nail plate hallux right. She states that this is been bothering her for quite some time and is worse with walking and exercise.  Objective: Vital signs are stable she is alert and oriented 3. Pulses are palpable. I have reviewed her past medical history medications and allergies. She has no erythema cellulitis drainage or odor she has thickening and swelling around the first metatarsophalangeal joint with pain on palpation and range of motion of the first metatarsophalangeal joint itself. She also has tenderness on palpation of the hallux nail plate with ridges transversely noted across the nail plate. There is some color changes. Radiographs 3 views do demonstrate early osteoarthritic changes first metatarsophalangeal joint. Small subungual exostosis is also noted.  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  Plan: After sterile Betadine skin prep I injected the first metatarsophalangeal joint today Alleviate symptoms of this joint I also debrided the elongated toenail and suggested that she keep that toenail cut it short's possible I will follow up with her on an as needed basis for this problem.

## 2015-12-10 ENCOUNTER — Encounter: Payer: Self-pay | Admitting: General Practice

## 2015-12-10 ENCOUNTER — Telehealth: Payer: Self-pay | Admitting: Cardiology

## 2015-12-10 ENCOUNTER — Other Ambulatory Visit: Payer: Self-pay | Admitting: Podiatry

## 2015-12-10 MED ORDER — CLOPIDOGREL BISULFATE 75 MG PO TABS: 75.0000 mg | ORAL_TABLET | Freq: Every day | ORAL | 11 refills | Status: DC

## 2015-12-10 NOTE — Progress Notes (Signed)
Spiritual Care Note  Returned VM from [place], who was calling to connect and share updates, including about her heart attack, upcoming rehab, fall and hand injury, and continuing to cope and make meaning through helping others.  She is able to verbalize that being upbeat and grateful is her natural way, and her stories illustrate how she is navigating setbacks and returning to baseline of joy, gratitude, and connection.  For example, she enjoys meditative coloring, especially in a religious coloring book she chose, and she is back to this personal practice (which, per pt, is also helping her hand); she enjoys the reflective experience and then gives her completed artworks to others to encourage them.  Prayer and faith are cornerstones of support and purpose in her life.    Roree plans to continue to call periodically and hopes to visit [facility] after cardiac rehab to connect with people whose care and support she has valued here, but please also page if needs arise or circumstances change.  Thank you.  [place], [place], [facility] Pager [phone] Voicemail [phone]

## 2015-12-10 NOTE — Telephone Encounter (Signed)
Reviewed with pt who states understanding.  She will started Plavix 300 mg X 1 day and then take 75 mg a day thereafter.  She used [facility] and the Rx will be sent into there.

## 2015-12-10 NOTE — Telephone Encounter (Signed)
Discontinue Brilinta. Load Plavix 300 mg. Then take Plavix 75 mg daily thereafter. Candee Furbish, MD

## 2015-12-10 NOTE — Telephone Encounter (Signed)
NEw MEssage  Pt c/o "heartburn"  Pt c/o of Chest Pain: STAT if CP now or developed within 24 hours  1. Are you having CP right now? Yes   2. Are you experiencing any other symptoms (ex. SOB, nausea, vomiting, sweating)? SOB  3. How long have you been experiencing CP? Several days   4. Is your CP continuous or coming and going? Stated it mostly comes after taking new medication- Brilinta   5. Have you taken Nitroglycerin? No  ?

## 2015-12-10 NOTE — Telephone Encounter (Signed)
Received t/c from pt who is reporting that she has been having increasingly worse SOB since starting on Brilinta.  Today is the worst her SOB has been after walking up to her house [place]  She is also c/o heartburn that TUMS doesn't help.  She reports a HX of acid reflux.  Her SOB and chest discomfort does not feel like it did prior to her MI in 6/17.  She is aware to continue to monitor her s/s and if they get worse to report to the ED for evaluation.  I will forward information to Dr Marlou Porch for review and to see if he would like to change her from [place].  She is in agreement and states understanding.

## 2015-12-11 ENCOUNTER — Other Ambulatory Visit: Payer: Self-pay | Admitting: Hematology and Oncology

## 2015-12-17 ENCOUNTER — Encounter (HOSPITAL_COMMUNITY): Payer: Self-pay

## 2015-12-17 ENCOUNTER — Encounter (HOSPITAL_COMMUNITY)
Admission: RE | Admit: 2015-12-17 | Discharge: 2015-12-17 | Disposition: A | Discharge status: Home / Self Care | Payer: Medicare Other | Source: Ambulatory Visit | Attending: Cardiology | Admitting: Cardiology

## 2015-12-17 VITALS — BP 117/84 | HR 60 | Ht 59.25 in | Wt 163.1 lb

## 2015-12-17 DIAGNOSIS — Z955 Presence of coronary angioplasty implant and graft: Secondary | ICD-10-CM

## 2015-12-17 DIAGNOSIS — I214 Non-ST elevation (NSTEMI) myocardial infarction: Secondary | ICD-10-CM | POA: Diagnosis not present

## 2015-12-17 NOTE — Progress Notes (Signed)
Cardiac Individual Treatment Plan  Patient Details  Name: Angela Bond MRN: [id number] Date of Birth: 08-04-1945 Referring Provider:   Flowsheet Row CARDIAC REHAB PHASE II ORIENTATION from 12/17/2015 in [place]  Referring Provider  Candee Furbish, MD      Initial Encounter Date:  [place] PHASE II ORIENTATION from 12/17/2015 in [place]  Date  12/17/15  Referring Provider  Candee Furbish, MD      Visit Diagnosis: 11/05/15 NSTEMI (non-ST elevated myocardial infarction) ([place])  11/05/15 Stented coronary artery  Patient's Home Medications on Admission:  Current Outpatient Prescriptions:  .  aspirin EC 81 MG EC tablet, Take 1 tablet (81 mg total) by mouth daily., Disp: , Rfl:  .  calcium carbonate (OS-CAL) 600 MG TABS, Take 600 mg by mouth 2 (two) times daily with a meal., Disp: , Rfl:  .  carboxymethylcellulose (REFRESH PLUS) 0.5 % SOLN, Place 1 drop into both eyes QID. , Disp: , Rfl:  .  Cholecalciferol (VITAMIN D) 2000 UNITS tablet, Take 2,000 Units by mouth 2 (two) times daily. , Disp: , Rfl:  .  clopidogrel (PLAVIX) 75 MG tablet, Take 1 tablet (75 mg total) by mouth daily. Take (4) tablets the first day a then (1) tablet a day thereafter, Disp: 34 tablet, Rfl: 11 .  dexlansoprazole (DEXILANT) 60 MG capsule, Take 60 mg by mouth daily., Disp: , Rfl:  .  fexofenadine (ALLEGRA) 180 MG tablet, Take 180 mg by mouth daily., Disp: , Rfl:  .  FLUoxetine (PROZAC) 20 MG capsule, Take 20 mg by mouth daily., Disp: , Rfl: 11 .  gabapentin (NEURONTIN) 300 MG capsule, Take 1 capsule by mouth 3  times daily, Disp: 270 capsule, Rfl: 1 .  LORazepam (ATIVAN) 1 MG tablet, Take 1 mg at bedtime and prior to radiation as directed and as needed for anxiety. (Patient taking differently: Take 1 mg by mouth at bedtime. ), Disp: 60 tablet, Rfl: 1 .  lovastatin (MEVACOR) 40 MG tablet, Take 40 mg by mouth at bedtime. , Disp: , Rfl:   .  metoprolol tartrate (LOPRESSOR) 25 MG tablet, Take 0.5 tablets (12.5 mg total) by mouth 2 (two) times daily., Disp: 30 tablet, Rfl: 5 .  nitrofurantoin (MACRODANTIN) 100 MG capsule, Take 100 mg by mouth at bedtime., Disp: , Rfl:  .  nitroGLYCERIN (NITROSTAT) 0.4 MG SL tablet, Place 1 tablet (0.4 mg total) under the tongue every 5 (five) minutes as needed for chest pain (CP or SOB)., Disp: 25 tablet, Rfl: 2 .  sodium chloride (OCEAN) 0.65 % SOLN nasal spray, Place 2 sprays into both nostrils as needed for congestion., Disp: , Rfl:  .  lisinopril (PRINIVIL,ZESTRIL) 2.5 MG tablet, Take 1 tablet (2.5 mg total) by mouth daily. (Patient not taking: Reported on 11/19/2015), Disp: 30 tablet, Rfl: 5 .  meclizine (ANTIVERT) 25 MG tablet, Take 12.5-25 mg by mouth 3 (three) times daily as needed for dizziness., Disp: , Rfl:   Past Medical History: Past Medical History:  Diagnosis Date  . Anemia    hx  . Anxiety   . Anxiety and depression   . Arthritis    "~ all over my body; in the joints"  . Breast cancer, right ([place]) 3/16  . Coronary artery disease   . Depression   . Dysrhythmia   . GERD (gastroesophageal reflux disease)   . H/O hiatal hernia   . Heart murmur   . History of blood  transfusion    "when taking chemo & w/one of my knee replacements" (11/05/2015)  . Hypercholesteremia   . Hypertension   . Pneumonia X 1  . Shingles 5/15   hx  . Shortness of breath    "stomach is in lungs" when had hernia 2014 none now(07/28/14)  . Sinus headache     Tobacco Use: History  Smoking Status  . Never Smoker  Smokeless Tobacco  . Never Used    Labs: Recent Review Flowsheet Data    Labs for ITP Cardiac and Pulmonary Rehab Latest Ref Rng & Units 11/04/2006 09/20/2013 09/21/2013 11/05/2015 11/06/2015   Cholestrol 0 - 200 mg/dL - - 166 - 158   LDLCALC 0 - 99 mg/dL - - 90 - 88   HDL >40 mg/dL - - 55 - 47   Trlycerides <150 mg/dL - - 107 - 113   Hemoglobin A1c 4.8 - 5.6 % - 5.6 - 5.5 -   HCO3 -  30.1(H) - - - -   TCO2 0 - 100 mmol/L 31 28 - - -      Capillary Blood Glucose: Lab Results  Component Value Date   GLUCAP 176 (H) 09/20/2013   GLUCAP 99 09/20/2013     Exercise Target Goals: Date: 12/17/15  Exercise Program Goal: Individual exercise prescription set with THRR, safety & activity barriers. Participant demonstrates ability to understand and report RPE using BORG scale, to self-measure pulse accurately, and to acknowledge the importance of the exercise prescription.  Exercise Prescription Goal: Starting with aerobic activity 30 plus minutes a day, 3 days per week for initial exercise prescription. Provide home exercise prescription and guidelines that participant acknowledges understanding prior to discharge.  Activity Barriers & Risk Stratification:     Activity Barriers & Cardiac Risk Stratification - 12/17/15 0823      Activity Barriers & Cardiac Risk Stratification   Activity Barriers History of Falls;Back Problems;Other (comment);Shortness of Breath;Assistive Device;Joint Problems   Comments 2 fusion in lower back, broken R thumb and neck pain   Cardiac Risk Stratification High      6 Minute Walk:     6 Minute Walk    Row Name 12/17/15 1553         6 Minute Walk   Phase Initial     Distance 1208 feet     Walk Time 6 minutes     # of Rest Breaks 0     MPH 2.28     METS 2.2     RPE 11     VO2 Peak 7.7     Symptoms No     Resting HR 60 bpm     Resting BP 117/84     Max Ex. HR 92 bpm     Max Ex. BP 129/85     2 Minute Post BP 118/80        Initial Exercise Prescription:     Initial Exercise Prescription - 12/17/15 1500      Date of Initial Exercise RX and Referring Provider   Date 12/17/15   Referring Provider Candee Furbish, MD     Treadmill   MPH 1.7   Grade 0   Minutes 10   METs 2.3     NuStep   Level 2   Minutes 10   METs 1.5     Arm Ergometer   Level 1   Minutes 10   METs 1     Prescription Details   Frequency  (times per week) 3  Duration Progress to 30 minutes of continuous aerobic without signs/symptoms of physical distress     Intensity   THRR 40-80% of Max Heartrate 60-119   Ratings of Perceived Exertion 11-13   Perceived Dyspnea 0-4     Progression   Progression Continue to progress workloads to maintain intensity without signs/symptoms of physical distress.     Resistance Training   Training Prescription Yes   Weight 1lb   Reps 10-12      Perform Capillary Blood Glucose checks as needed.  Exercise Prescription Changes:   Exercise Comments:   Discharge Exercise Prescription (Final Exercise Prescription Changes):   Nutrition:  Target Goals: Understanding of nutrition guidelines, daily intake of sodium 1500mg , cholesterol 200mg , calories 30% from fat and 7% or less from saturated fats, daily to have 5 or more servings of fruits and vegetables.  Biometrics:     Pre Biometrics - 12/17/15 1551      Pre Biometrics   Waist Circumference 36 inches   Hip Circumference 43.5 inches   Waist to Hip Ratio 0.83 %   Triceps Skinfold 44 mm   % Body Fat 45.6 %   Grip Strength 21 kg   Flexibility 11.5 in   Single Leg Stand 3.75 seconds       Nutrition Therapy Plan and Nutrition Goals:   Nutrition Discharge: Nutrition Scores:   Nutrition Goals Re-Evaluation:   Psychosocial: Target Goals: Acknowledge presence or absence of depression, maximize coping skills, provide positive support system. Participant is able to verbalize types and ability to use techniques and skills needed for reducing stress and depression.  Initial Review & Psychosocial Screening:   Quality of Life Scores:   PHQ-9: Recent Review Flowsheet Data    Depression screen [facility] 2/9 04/15/2015 02/03/2015 01/07/2015   Decreased Interest 0 0 1   Down, Depressed, Hopeless 0 0 1   PHQ - 2 Score 0 0 2   Altered sleeping 0 - 1   Tired, decreased energy 0 - 1   Change in appetite 0 - 1   Feeling bad or  failure about yourself  0 - 0   Trouble concentrating 0 - 0   Moving slowly or fidgety/restless 0 - 0   Suicidal thoughts 0  - 0   PHQ-9 Score 0 - 5   Difficult doing work/chores Not difficult at all - Not difficult at all      Psychosocial Evaluation and Intervention:   Psychosocial Re-Evaluation:   Vocational Rehabilitation: Provide vocational rehab assistance to qualifying candidates.   Vocational Rehab Evaluation & Intervention:     Vocational Rehab - 12/17/15 1802      Initial Vocational Rehab Evaluation & Intervention   Assessment shows need for Vocational Rehabilitation No  Pt is retired and does not plan to return to [employer].      Education: Education Goals: Education classes will be provided on a weekly basis, covering required topics. Participant will state understanding/return demonstration of topics presented.  Learning Barriers/Preferences:     Learning Barriers/Preferences - 12/17/15 0825      Learning Barriers/Preferences   Learning Barriers Sight   Learning Preferences Skilled Demonstration;Written Material;Pictoral;Video      Education Topics: Count Your Pulse:  -Group instruction provided by verbal instruction, demonstration, patient participation and written materials to support subject.  Instructors address importance of being able to find your pulse and how to count your pulse when at home without a heart monitor.  Patients get hands on experience counting their pulse with  staff help and individually.   Heart Attack, Angina, and Risk Factor Modification:  -Group instruction provided by verbal instruction, video, and written materials to support subject.  Instructors address signs and symptoms of angina and heart attacks.    Also discuss risk factors for heart disease and how to make changes to improve heart health risk factors.   Functional Fitness:  -Group instruction provided by verbal instruction, demonstration, patient  participation, and written materials to support subject.  Instructors address safety measures for doing things around the house.  Discuss how to get up and down off the floor, how to pick things up properly, how to safely get out of a chair without assistance, and balance training.   Meditation and Mindfulness:  -Group instruction provided by verbal instruction, patient participation, and written materials to support subject.  Instructor addresses importance of mindfulness and meditation practice to help reduce stress and improve awareness.  Instructor also leads participants through a meditation exercise.    Stretching for Flexibility and Mobility:  -Group instruction provided by verbal instruction, patient participation, and written materials to support subject.  Instructors lead participants through series of stretches that are designed to increase flexibility thus improving mobility.  These stretches are additional exercise for major muscle groups that are typically performed during regular warm up and cool down.   Hands Only CPR Anytime:  -Group instruction provided by verbal instruction, video, patient participation and written materials to support subject.  Instructors co-teach with AHA video for hands only CPR.  Participants get hands on experience with mannequins.   Nutrition I class: Heart Healthy Eating:  -Group instruction provided by PowerPoint slides, verbal discussion, and written materials to support subject matter. The instructor gives an explanation and review of the Therapeutic Lifestyle Changes diet recommendations, which includes a discussion on lipid goals, dietary fat, sodium, fiber, plant stanol/sterol esters, sugar, and the components of a well-balanced, healthy diet.   Nutrition II class: Lifestyle Skills:  -Group instruction provided by PowerPoint slides, verbal discussion, and written materials to support subject matter. The instructor gives an explanation and review  of label reading, grocery shopping for heart health, heart healthy recipe modifications, and ways to make healthier choices when eating out.   Diabetes Question & Answer:  -Group instruction provided by PowerPoint slides, verbal discussion, and written materials to support subject matter. The instructor gives an explanation and review of diabetes co-morbidities, pre- and post-prandial blood glucose goals, pre-exercise blood glucose goals, signs, symptoms, and treatment of hypoglycemia and hyperglycemia, and foot care basics.   Diabetes Blitz:  -Group instruction provided by PowerPoint slides, verbal discussion, and written materials to support subject matter. The instructor gives an explanation and review of the physiology behind type 1 and type 2 diabetes, diabetes medications and rational behind using different medications, pre- and post-prandial blood glucose recommendations and Hemoglobin A1c goals, diabetes diet, and exercise including blood glucose guidelines for exercising safely.    Portion Distortion:  -Group instruction provided by PowerPoint slides, verbal discussion, written materials, and food models to support subject matter. The instructor gives an explanation of serving size versus portion size, changes in portions sizes over the last 20 years, and what consists of a serving from each food group.   Stress Management:  -Group instruction provided by verbal instruction, video, and written materials to support subject matter.  Instructors review role of stress in heart disease and how to cope with stress positively.     Exercising on Your Own:  -Group instruction provided  by verbal instruction, power point, and written materials to support subject.  Instructors discuss benefits of exercise, components of exercise, frequency and intensity of exercise, and end points for exercise.  Also discuss use of nitroglycerin and activating EMS.  Review options of places to exercise outside of  rehab.  Review guidelines for sex with heart disease.   Cardiac Drugs I:  -Group instruction provided by verbal instruction and written materials to support subject.  Instructor reviews cardiac drug classes: antiplatelets, anticoagulants, beta blockers, and statins.  Instructor discusses reasons, side effects, and lifestyle considerations for each drug class.   Cardiac Drugs II:  -Group instruction provided by verbal instruction and written materials to support subject.  Instructor reviews cardiac drug classes: angiotensin converting enzyme inhibitors (ACE-I), angiotensin II receptor blockers (ARBs), nitrates, and calcium channel blockers.  Instructor discusses reasons, side effects, and lifestyle considerations for each drug class.   Anatomy and Physiology of the Circulatory System:  -Group instruction provided by verbal instruction, video, and written materials to support subject.  Reviews functional anatomy of heart, how it relates to various diagnoses, and what role the heart plays in the overall system.   Knowledge Questionnaire Score:     Knowledge Questionnaire Score - 12/17/15 1536      Knowledge Questionnaire Score   Pre Score 21/24      Core Components/Risk Factors/Patient Goals at Admission:     Personal Goals and Risk Factors at Admission - 12/17/15 0828      Core Components/Risk Factors/Patient Goals on Admission    Weight Management Yes;Obesity   Intervention Weight Management: Develop a combined nutrition and exercise program designed to reach desired caloric intake, while maintaining appropriate intake of nutrient and fiber, sodium and fats, and appropriate energy expenditure required for the weight goal.;Weight Management: Provide education and appropriate resources to help participant work on and attain dietary goals.;Weight Management/Obesity: Establish reasonable short term and long term weight goals.;Obesity: Provide education and appropriate resources to help  participant work on and attain dietary goals.   Expected Outcomes Short Term: Continue to assess and modify interventions until short term weight is achieved;Weight Maintenance: Understanding of the daily nutrition guidelines, which includes 25-35% calories from fat, 7% or less cal from saturated fats, less than 200mg  cholesterol, less than 1.5gm of sodium, & 5 or more servings of fruits and vegetables daily;Weight Loss: Understanding of general recommendations for a balanced deficit meal plan, which promotes 1-2 lb weight loss per week and includes a negative energy balance of [phone] kcal/d;Understanding recommendations for meals to include 15-35% energy as protein, 25-35% energy from fat, 35-60% energy from carbohydrates, less than 200mg  of dietary cholesterol, 20-35 gm of total fiber daily;Understanding of distribution of calorie intake throughout the day with the consumption of 4-5 meals/snacks   Increase Strength and Stamina Yes   Intervention Provide advice, education, support and counseling about physical activity/exercise needs.;Develop an individualized exercise prescription for aerobic and resistive training based on initial evaluation findings, risk stratification, comorbidities and participant's personal goals.   Expected Outcomes Achievement of increased cardiorespiratory fitness and enhanced flexibility, muscular endurance and strength shown through measurements of functional capacity and personal statement of participant.   Improve shortness of breath with ADL's Yes   Intervention Provide education, individualized exercise plan and daily activity instruction to help decrease symptoms of SOB with activities of daily living.   Expected Outcomes Short Term: Achieves a reduction of symptoms when performing activities of daily living.   Hypertension Yes   Intervention Provide education  on lifestyle modifcations including regular physical activity/exercise, weight management, moderate sodium  restriction and increased consumption of fresh fruit, vegetables, and low fat dairy, alcohol moderation, and smoking cessation.;Monitor prescription use compliance.   Expected Outcomes Short Term: Continued assessment and intervention until BP is < 140/13mm HG in hypertensive participants. < 130/16mm HG in hypertensive participants with diabetes, heart failure or chronic kidney disease.;Long Term: Maintenance of blood pressure at goal levels.   Lipids Yes   Intervention Provide education and support for participant on nutrition & aerobic/resistive exercise along with prescribed medications to achieve LDL 70mg , HDL >40mg .   Expected Outcomes Short Term: Participant states understanding of desired cholesterol values and is compliant with medications prescribed. Participant is following exercise prescription and nutrition guidelines.;Long Term: Cholesterol controlled with medications as prescribed, with individualized exercise RX and with personalized nutrition plan. Value goals: LDL < 70mg , HDL > 40 mg.   Personal Goal Other Yes   Personal Goal short: Be able to do more activities with less fatigue  Long: Be able to climb stairs with less SOB and lose 20lbs   Intervention Provide nutrition and education counseling to assist with increaseing exercise capacity, losing weight and increasing energy levels   Expected Outcomes Pt will be able to lose wt, do more activities and be able to climb stairs without difficulties      Core Components/Risk Factors/Patient Goals Review:    Core Components/Risk Factors/Patient Goals at Discharge (Final Review):    ITP Comments:     ITP Comments    Row Name 12/17/15 0819           ITP Comments Dr. Fransico Him, Medical Director          Comments:  Pt in today for cardiac rehab orientation from 0800-1130. As a part of orientation, pt completed 6 minute walk test.  Pt tolerated well and used the wheelchair cart for added support.  Monitor showed SR with  occasional pvc exertional in nature.  Pt is super excited to participate in cardiac rehab.Cherre Huger, BSN

## 2015-12-17 NOTE — Progress Notes (Signed)
Cardiac Rehab Medication Review by a Pharmacist  Does the patient  feel that his/her medications are working for him/her?  yes  Has the patient been experiencing any side effects to the medications prescribed?  no  Does the patient measure his/her own blood pressure or blood glucose at home?  yes   Does the patient have any problems obtaining medications due to transportation or finances?   Nitrofurantoin a little high- advised to talk to doctor/ask for discount card  Understanding of regimen: good Understanding of indications: good Potential of compliance: good    Pharmacist comments: Angela Bond is a 70 yo female who present to cardiac rehab without assistance. Patient regularly monitors blood pressure and understand regimen.    Angela Bond D Pharmacy Resident 12/17/2015 8:55 AM

## 2015-12-21 ENCOUNTER — Encounter (HOSPITAL_COMMUNITY): Payer: Self-pay

## 2015-12-21 ENCOUNTER — Encounter (HOSPITAL_COMMUNITY)
Admission: RE | Admit: 2015-12-21 | Discharge: 2015-12-21 | Disposition: A | Discharge status: Home / Self Care | Payer: Medicare Other | Source: Ambulatory Visit | Attending: Cardiology | Admitting: Cardiology

## 2015-12-21 DIAGNOSIS — Z955 Presence of coronary angioplasty implant and graft: Secondary | ICD-10-CM

## 2015-12-21 DIAGNOSIS — I214 Non-ST elevation (NSTEMI) myocardial infarction: Secondary | ICD-10-CM

## 2015-12-21 NOTE — Progress Notes (Signed)
Daily Session Note  Patient Details  Name: Angela Bond MRN: [id number] Date of Birth: 08-04-1945 Referring Provider:   Flowsheet Row CARDIAC REHAB PHASE II ORIENTATION from 12/17/2015 in [place]  Referring Provider  Candee Furbish, MD      Encounter Date: 12/21/2015  Check In:     Session Check In - 12/21/15 1501      Check-In   Location [facility]   Staff Present Andi Hence, RN, BSN;Amber Fair, MS, ACSM RCEP, Exercise Physiologist;Carlette Wilber Oliphant, RN, Marga Melnick, RN, BSN   Supervising physician immediately available to respond to emergencies [facility] immediately available   Physician(s) Dr Nehemiah Settle   Medication changes reported     No   Fall or balance concerns reported    No   Warm-up and Cool-down Performed as group-led Location manager Performed Yes   VAD Patient? No     Pain Assessment   Currently in Pain? No/denies      Capillary Blood Glucose: No results found for this or any previous visit (from the past 24 hour(s)).   Goals Met:  Exercise tolerated well  Goals Unmet:  Not Applicable  Comments: Pt started cardiac rehab today.  Pt tolerated light exercise without difficulty. VSS, telemetry-sinus rhythm,  asymptomatic.  Medication list reconciled. Pt denies barriers to medicaiton compliance.  PSYCHOSOCIAL ASSESSMENT:  PHQ-0 Pt exhibits positive coping skills, hopeful outlook with supportive family. No psychosocial needs identified at this time, no psychosocial interventions necessary.    Pt enjoys bowling, reading and adult coloring.  Pt goals for cardiac rehab are to be able to return to bowling and increase her ability to do more activities without dyspnea, including climbing her stairs at home.    Pt oriented to exercise equipment and routine.    Understanding verbalized.   Dr. Fransico Him is Medical Director for Cardiac Rehab at [facility].

## 2015-12-22 DIAGNOSIS — G5601 Carpal tunnel syndrome, right upper limb: Secondary | ICD-10-CM | POA: Insufficient documentation

## 2015-12-23 ENCOUNTER — Encounter (HOSPITAL_COMMUNITY)
Admission: RE | Admit: 2015-12-23 | Discharge: 2015-12-23 | Disposition: A | Discharge status: Home / Self Care | Payer: Medicare Other | Source: Ambulatory Visit | Attending: Cardiology | Admitting: Cardiology

## 2015-12-23 DIAGNOSIS — Z955 Presence of coronary angioplasty implant and graft: Secondary | ICD-10-CM

## 2015-12-23 DIAGNOSIS — I214 Non-ST elevation (NSTEMI) myocardial infarction: Secondary | ICD-10-CM | POA: Diagnosis not present

## 2015-12-25 ENCOUNTER — Encounter (HOSPITAL_COMMUNITY)
Admission: RE | Admit: 2015-12-25 | Discharge: 2015-12-25 | Disposition: A | Discharge status: Home / Self Care | Payer: Medicare Other | Source: Ambulatory Visit | Attending: Cardiology | Admitting: Cardiology

## 2015-12-25 DIAGNOSIS — I214 Non-ST elevation (NSTEMI) myocardial infarction: Secondary | ICD-10-CM

## 2015-12-25 DIAGNOSIS — Z955 Presence of coronary angioplasty implant and graft: Secondary | ICD-10-CM

## 2015-12-28 ENCOUNTER — Encounter (HOSPITAL_COMMUNITY)
Admission: RE | Admit: 2015-12-28 | Discharge: 2015-12-28 | Disposition: A | Discharge status: Home / Self Care | Payer: Medicare Other | Source: Ambulatory Visit | Attending: Cardiology | Admitting: Cardiology

## 2015-12-28 DIAGNOSIS — I214 Non-ST elevation (NSTEMI) myocardial infarction: Secondary | ICD-10-CM

## 2015-12-28 DIAGNOSIS — Z955 Presence of coronary angioplasty implant and graft: Secondary | ICD-10-CM

## 2015-12-30 ENCOUNTER — Encounter (HOSPITAL_COMMUNITY)
Admission: RE | Admit: 2015-12-30 | Discharge: 2015-12-30 | Disposition: A | Discharge status: Home / Self Care | Payer: Medicare Other | Source: Ambulatory Visit | Attending: Cardiology | Admitting: Cardiology

## 2015-12-30 DIAGNOSIS — I214 Non-ST elevation (NSTEMI) myocardial infarction: Secondary | ICD-10-CM

## 2015-12-30 DIAGNOSIS — Z955 Presence of coronary angioplasty implant and graft: Secondary | ICD-10-CM

## 2015-12-31 ENCOUNTER — Encounter: Payer: Self-pay | Admitting: Cardiology

## 2016-01-01 ENCOUNTER — Encounter (HOSPITAL_COMMUNITY)
Admission: RE | Admit: 2016-01-01 | Discharge: 2016-01-01 | Disposition: A | Discharge status: Home / Self Care | Payer: Medicare Other | Source: Ambulatory Visit | Attending: Cardiology | Admitting: Cardiology

## 2016-01-01 DIAGNOSIS — I214 Non-ST elevation (NSTEMI) myocardial infarction: Secondary | ICD-10-CM

## 2016-01-01 DIAGNOSIS — Z955 Presence of coronary angioplasty implant and graft: Secondary | ICD-10-CM

## 2016-01-01 NOTE — Progress Notes (Signed)
Cardiac Individual Treatment Plan  Patient Details  Name: Angela Bond MRN: [id number] Date of Birth: 02/02/1946 Referring Provider:   Flowsheet Row CARDIAC REHAB PHASE II ORIENTATION from 12/17/2015 in [facility]  Referring Provider  Candee Furbish, MD      Initial Encounter Date:  [facility] PHASE II ORIENTATION from 12/17/2015 in [facility]  Date  12/17/15  Referring Provider  Candee Furbish, MD      Visit Diagnosis: No diagnosis found.  Patient's Home Medications on Admission:  Current Outpatient Prescriptions:  .  aspirin EC 81 MG EC tablet, Take 1 tablet (81 mg total) by mouth daily., Disp: , Rfl:  .  calcium carbonate (OS-CAL) 600 MG TABS, Take 600 mg by mouth 2 (two) times daily with a meal., Disp: , Rfl:  .  carboxymethylcellulose (REFRESH PLUS) 0.5 % SOLN, Place 1 drop into both eyes QID. , Disp: , Rfl:  .  Cholecalciferol (VITAMIN D) 2000 UNITS tablet, Take 2,000 Units by mouth 2 (two) times daily. , Disp: , Rfl:  .  clopidogrel (PLAVIX) 75 MG tablet, Take 1 tablet (75 mg total) by mouth daily. Take (4) tablets the first day a then (1) tablet a day thereafter, Disp: 34 tablet, Rfl: 11 .  dexlansoprazole (DEXILANT) 60 MG capsule, Take 60 mg by mouth daily., Disp: , Rfl:  .  fexofenadine (ALLEGRA) 180 MG tablet, Take 180 mg by mouth daily., Disp: , Rfl:  .  FLUoxetine (PROZAC) 20 MG capsule, Take 20 mg by mouth daily., Disp: , Rfl: 11 .  gabapentin (NEURONTIN) 300 MG capsule, Take 1 capsule by mouth 3  times daily, Disp: 270 capsule, Rfl: 1 .  lisinopril (PRINIVIL,ZESTRIL) 2.5 MG tablet, Take 1 tablet (2.5 mg total) by mouth daily., Disp: 30 tablet, Rfl: 5 .  LORazepam (ATIVAN) 1 MG tablet, Take 1 mg at bedtime and prior to radiation as directed and as needed for anxiety. (Patient taking differently: Take 1 mg by mouth at bedtime. ), Disp: 60 tablet, Rfl: 1 .  lovastatin (MEVACOR) 40 MG tablet, Take  40 mg by mouth at bedtime. , Disp: , Rfl:  .  meclizine (ANTIVERT) 25 MG tablet, Take 12.5-25 mg by mouth 3 (three) times daily as needed for dizziness., Disp: , Rfl:  .  metoprolol tartrate (LOPRESSOR) 25 MG tablet, Take 0.5 tablets (12.5 mg total) by mouth 2 (two) times daily., Disp: 30 tablet, Rfl: 5 .  nitrofurantoin (MACRODANTIN) 100 MG capsule, Take 100 mg by mouth at bedtime., Disp: , Rfl:  .  nitroGLYCERIN (NITROSTAT) 0.4 MG SL tablet, Place 1 tablet (0.4 mg total) under the tongue every 5 (five) minutes as needed for chest pain (CP or SOB)., Disp: 25 tablet, Rfl: 2 .  sodium chloride (OCEAN) 0.65 % SOLN nasal spray, Place 2 sprays into both nostrils as needed for congestion., Disp: , Rfl:   Past Medical History: Past Medical History:  Diagnosis Date  . Anemia    hx  . Anxiety   . Anxiety and depression   . Arthritis    "~ all over my body; in the joints"  . Breast cancer, right ([facility]) 3/16  . Coronary artery disease   . Depression   . Dysrhythmia   . GERD (gastroesophageal reflux disease)   . H/O hiatal hernia   . Heart murmur   . History of blood transfusion    "when taking chemo & w/one of my knee replacements" (11/05/2015)  .  Hypercholesteremia   . Hypertension   . Pneumonia X 1  . Shingles 5/15   hx  . Shortness of breath    "stomach is in lungs" when had hernia 2014 none now(07/28/14)  . Sinus headache     Tobacco Use: History  Smoking Status  . Never Smoker  Smokeless Tobacco  . Never Used    Labs: Recent Review Flowsheet Data    Labs for ITP Cardiac and Pulmonary Rehab Latest Ref Rng & Units 11/04/2006 09/20/2013 09/21/2013 11/05/2015 11/06/2015   Cholestrol 0 - 200 mg/dL - - 166 - 158   LDLCALC 0 - 99 mg/dL - - 90 - 88   HDL >40 mg/dL - - 55 - 47   Trlycerides <150 mg/dL - - 107 - 113   Hemoglobin A1c 4.8 - 5.6 % - 5.6 - 5.5 -   HCO3 - 30.1(H) - - - -   TCO2 0 - 100 mmol/L 31 28 - - -      Capillary Blood Glucose: Lab Results  Component Value Date    GLUCAP 176 (H) 09/20/2013   GLUCAP 99 09/20/2013     Exercise Target Goals:    Exercise Program Goal: Individual exercise prescription set with THRR, safety & activity barriers. Participant demonstrates ability to understand and report RPE using BORG scale, to self-measure pulse accurately, and to acknowledge the importance of the exercise prescription.  Exercise Prescription Goal: Starting with aerobic activity 30 plus minutes a day, 3 days per week for initial exercise prescription. Provide home exercise prescription and guidelines that participant acknowledges understanding prior to discharge.  Activity Barriers & Risk Stratification:     Activity Barriers & Cardiac Risk Stratification - 12/17/15 0823      Activity Barriers & Cardiac Risk Stratification   Activity Barriers History of Falls;Back Problems;Other (comment);Shortness of Breath;Assistive Device;Joint Problems   Comments 2 fusion in lower back, broken R thumb and neck pain   Cardiac Risk Stratification High      6 Minute Walk:     6 Minute Walk    Row Name 12/17/15 1553         6 Minute Walk   Phase Initial     Distance 1208 feet     Walk Time 6 minutes     # of Rest Breaks 0     MPH 2.28     METS 2.2     RPE 11     VO2 Peak 7.7     Symptoms No     Resting HR 60 bpm     Resting BP 117/84     Max Ex. HR 92 bpm     Max Ex. BP 129/85     2 Minute Post BP 118/80        Initial Exercise Prescription:     Initial Exercise Prescription - 12/17/15 1500      Date of Initial Exercise RX and Referring Provider   Date 12/17/15   Referring Provider Candee Furbish, MD     Treadmill   MPH 1.7   Grade 0   Minutes 10   METs 2.3     NuStep   Level 2   Minutes 10   METs 1.5     Arm Ergometer   Level 1   Minutes 10   METs 1     Prescription Details   Frequency (times per week) 3   Duration Progress to 30 minutes of continuous aerobic without signs/symptoms of physical distress  Intensity    THRR 40-80% of Max Heartrate 60-119   Ratings of Perceived Exertion 11-13   Perceived Dyspnea 0-4     Progression   Progression Continue to progress workloads to maintain intensity without signs/symptoms of physical distress.     Resistance Training   Training Prescription Yes   Weight 1lb   Reps 10-12      Perform Capillary Blood Glucose checks as needed.  Exercise Prescription Changes:   Exercise Comments:     Exercise Comments    Row Name 12/25/15 1119           Exercise Comments There are no changes to current Ex Rx          Discharge Exercise Prescription (Final Exercise Prescription Changes):   Nutrition:  Target Goals: Understanding of nutrition guidelines, daily intake of sodium 1500mg , cholesterol 200mg , calories 30% from fat and 7% or less from saturated fats, daily to have 5 or more servings of fruits and vegetables.  Biometrics:     Pre Biometrics - 12/17/15 1551      Pre Biometrics   Waist Circumference 36 inches   Hip Circumference 43.5 inches   Waist to Hip Ratio 0.83 %   Triceps Skinfold 44 mm   % Body Fat 45.6 %   Grip Strength 21 kg   Flexibility 11.5 in   Single Leg Stand 3.75 seconds       Nutrition Therapy Plan and Nutrition Goals:     Nutrition Therapy & Goals - 12/18/15 1347      Nutrition Therapy   Diet Therapeutic Lifestyle Changes     Personal Nutrition Goals   Personal Goal #1 1-2 lb wt loss/week to a goal wt loss of 6-24 lb at graduation from [facility], educate and counsel regarding individualized specific dietary modifications aiming towards targeted core components such as weight, hypertension, lipid management, diabetes, heart failure and other comorbidities.   Expected Outcomes Short Term Goal: Understand basic principles of dietary content, such as calories, fat, sodium, cholesterol and nutrients.;Long Term Goal: Adherence to prescribed nutrition plan.       Nutrition Discharge: Nutrition Scores:     Nutrition Assessments - 12/18/15 1346      MEDFICTS Scores   Pre Score 15  will verify score with pt      Nutrition Goals Re-Evaluation:   Psychosocial: Target Goals: Acknowledge presence or absence of depression, maximize coping skills, provide positive support system. Participant is able to verbalize types and ability to use techniques and skills needed for reducing stress and depression.  Initial Review & Psychosocial Screening:     Initial Psych Review & Screening - 12/21/15 1637      Initial Review   Current issues with History of Depression     Family Dynamics   Good Support System? Yes   Comments strong faith base      Barriers   Psychosocial barriers to participate in program The patient should benefit from training in stress management and relaxation.     Screening Interventions   Interventions Encouraged to exercise      Quality of Life Scores:     Quality of Life - 12/25/15 1446      Quality of Life Scores   Health/Function Pre 21.86 %   Socioeconomic Pre 25.71 %   Psych/Spiritual Pre 27.43 %   Family Pre 27.6 %   GLOBAL Pre 24.73 %      PHQ-9:  Recent Review Flowsheet Data    Depression screen [facility] 2/9 12/21/2015 04/15/2015 02/03/2015 01/07/2015   Decreased Interest 0 0 0 1   Down, Depressed, Hopeless 1 0 0 1   PHQ - 2 Score 1 0 0 2   Altered sleeping - 0 - 1   Tired, decreased energy - 0 - 1   Change in appetite - 0 - 1   Feeling bad or failure about yourself  - 0 - 0   Trouble concentrating - 0 - 0   Moving slowly or fidgety/restless - 0 - 0   Suicidal thoughts - 0  - 0   PHQ-9 Score - 0 - 5   Difficult doing work/chores - Not difficult at all - Not difficult at all      Psychosocial Evaluation and Intervention:   Psychosocial Re-Evaluation:   Vocational Rehabilitation: Provide vocational rehab assistance to qualifying candidates.   Vocational Rehab Evaluation & Intervention:      Vocational Rehab - 12/17/15 1802      Initial Vocational Rehab Evaluation & Intervention   Assessment shows need for Vocational Rehabilitation No  Pt is retired and does not plan to return to [employer].      Education: Education Goals: Education classes will be provided on a weekly basis, covering required topics. Participant will state understanding/return demonstration of topics presented.  Learning Barriers/Preferences:     Learning Barriers/Preferences - 12/17/15 0825      Learning Barriers/Preferences   Learning Barriers Sight   Learning Preferences Skilled Demonstration;Written Material;Pictoral;Video      Education Topics: Count Your Pulse:  -Group instruction provided by verbal instruction, demonstration, patient participation and written materials to support subject.  Instructors address importance of being able to find your pulse and how to count your pulse when at home without a heart monitor.  Patients get hands on experience counting their pulse with staff help and individually.   Heart Attack, Angina, and Risk Factor Modification:  -Group instruction provided by verbal instruction, video, and written materials to support subject.  Instructors address signs and symptoms of angina and heart attacks.    Also discuss risk factors for heart disease and how to make changes to improve heart health risk factors.   Functional Fitness:  -Group instruction provided by verbal instruction, demonstration, patient participation, and written materials to support subject.  Instructors address safety measures for doing things around the house.  Discuss how to get up and down off the floor, how to pick things up properly, how to safely get out of a chair without assistance, and balance training.   Meditation and Mindfulness:  -Group instruction provided by verbal instruction, patient participation, and written materials to support subject.  Instructor addresses importance  of mindfulness and meditation practice to help reduce stress and improve awareness.  Instructor also leads participants through a meditation exercise.    Stretching for Flexibility and Mobility:  -Group instruction provided by verbal instruction, patient participation, and written materials to support subject.  Instructors lead participants through series of stretches that are designed to increase flexibility thus improving mobility.  These stretches are additional exercise for major muscle groups that are typically performed during regular warm up and cool down.   Hands Only CPR Anytime:  -Group instruction provided by verbal instruction, video, patient participation and written materials to support subject.  Instructors co-teach with AHA video for hands only CPR.  Participants get hands on experience with mannequins.   Nutrition I class: Heart Healthy Eating:  -  Group instruction provided by PowerPoint slides, verbal discussion, and written materials to support subject matter. The instructor gives an explanation and review of the Therapeutic Lifestyle Changes diet recommendations, which includes a discussion on lipid goals, dietary fat, sodium, fiber, plant stanol/sterol esters, sugar, and the components of a well-balanced, healthy diet.   Nutrition II class: Lifestyle Skills:  -Group instruction provided by PowerPoint slides, verbal discussion, and written materials to support subject matter. The instructor gives an explanation and review of label reading, grocery shopping for heart health, heart healthy recipe modifications, and ways to make healthier choices when eating out.   Diabetes Question & Answer:  -Group instruction provided by PowerPoint slides, verbal discussion, and written materials to support subject matter. The instructor gives an explanation and review of diabetes co-morbidities, pre- and post-prandial blood glucose goals, pre-exercise blood glucose goals, signs, symptoms, and  treatment of hypoglycemia and hyperglycemia, and foot care basics. Flowsheet Row CARDIAC REHAB PHASE II EXERCISE from 12/30/2015 in [facility]  Date  12/25/15  Educator  RD  Instruction Review Code  2- meets goals/outcomes      Diabetes Blitz:  -Group instruction provided by PowerPoint slides, verbal discussion, and written materials to support subject matter. The instructor gives an explanation and review of the physiology behind type 1 and type 2 diabetes, diabetes medications and rational behind using different medications, pre- and post-prandial blood glucose recommendations and Hemoglobin A1c goals, diabetes diet, and exercise including blood glucose guidelines for exercising safely.    Portion Distortion:  -Group instruction provided by PowerPoint slides, verbal discussion, written materials, and food models to support subject matter. The instructor gives an explanation of serving size versus portion size, changes in portions sizes over the last 20 years, and what consists of a serving from each food group. Flowsheet Row CARDIAC REHAB PHASE II EXERCISE from 12/30/2015 in [facility]  Date  12/23/15  Educator  RD  Instruction Review Code  2- meets goals/outcomes      Stress Management:  -Group instruction provided by verbal instruction, video, and written materials to support subject matter.  Instructors review role of stress in heart disease and how to cope with stress positively.     Exercising on Your Own:  -Group instruction provided by verbal instruction, power point, and written materials to support subject.  Instructors discuss benefits of exercise, components of exercise, frequency and intensity of exercise, and end points for exercise.  Also discuss use of nitroglycerin and activating EMS.  Review options of places to exercise outside of rehab.  Review guidelines for sex with heart disease.   Cardiac Drugs I:   -Group instruction provided by verbal instruction and written materials to support subject.  Instructor reviews cardiac drug classes: antiplatelets, anticoagulants, beta blockers, and statins.  Instructor discusses reasons, side effects, and lifestyle considerations for each drug class. Flowsheet Row CARDIAC REHAB PHASE II EXERCISE from 12/30/2015 in [facility]  Date  12/30/15  Educator  pharmD  Instruction Review Code  2- meets goals/outcomes      Cardiac Drugs II:  -Group instruction provided by verbal instruction and written materials to support subject.  Instructor reviews cardiac drug classes: angiotensin converting enzyme inhibitors (ACE-I), angiotensin II receptor blockers (ARBs), nitrates, and calcium channel blockers.  Instructor discusses reasons, side effects, and lifestyle considerations for each drug class.   Anatomy and Physiology of the Circulatory System:  -Group instruction provided by verbal instruction, video, and  written materials to support subject.  Reviews functional anatomy of heart, how it relates to various diagnoses, and what role the heart plays in the overall system.   Knowledge Questionnaire Score:     Knowledge Questionnaire Score - 12/17/15 1536      Knowledge Questionnaire Score   Pre Score 21/24      Core Components/Risk Factors/Patient Goals at Admission:     Personal Goals and Risk Factors at Admission - 12/17/15 0828      Core Components/Risk Factors/Patient Goals on Admission    Weight Management Yes;Obesity   Intervention Weight Management: Develop a combined nutrition and exercise program designed to reach desired caloric intake, while maintaining appropriate intake of nutrient and fiber, sodium and fats, and appropriate energy expenditure required for the weight goal.;Weight Management: Provide education and appropriate resources to help participant work on and attain dietary goals.;Weight Management/Obesity:  Establish reasonable short term and long term weight goals.;Obesity: Provide education and appropriate resources to help participant work on and attain dietary goals.   Expected Outcomes Short Term: Continue to assess and modify interventions until short term weight is achieved;Weight Maintenance: Understanding of the daily nutrition guidelines, which includes 25-35% calories from fat, 7% or less cal from saturated fats, less than 200mg  cholesterol, less than 1.5gm of sodium, & 5 or more servings of fruits and vegetables daily;Weight Loss: Understanding of general recommendations for a balanced deficit meal plan, which promotes 1-2 lb weight loss per week and includes a negative energy balance of [phone] kcal/d;Understanding recommendations for meals to include 15-35% energy as protein, 25-35% energy from fat, 35-60% energy from carbohydrates, less than 200mg  of dietary cholesterol, 20-35 gm of total fiber daily;Understanding of distribution of calorie intake throughout the day with the consumption of 4-5 meals/snacks   Increase Strength and Stamina Yes   Intervention Provide advice, education, support and counseling about physical activity/exercise needs.;Develop an individualized exercise prescription for aerobic and resistive training based on initial evaluation findings, risk stratification, comorbidities and participant's personal goals.   Expected Outcomes Achievement of increased cardiorespiratory fitness and enhanced flexibility, muscular endurance and strength shown through measurements of functional capacity and personal statement of participant.   Improve shortness of breath with ADL's Yes   Intervention Provide education, individualized exercise plan and daily activity instruction to help decrease symptoms of SOB with activities of daily living.   Expected Outcomes Short Term: Achieves a reduction of symptoms when performing activities of daily living.   Hypertension Yes   Intervention Provide  education on lifestyle modifcations including regular physical activity/exercise, weight management, moderate sodium restriction and increased consumption of fresh fruit, vegetables, and low fat dairy, alcohol moderation, and smoking cessation.;Monitor prescription use compliance.   Expected Outcomes Short Term: Continued assessment and intervention until BP is < 140/47mm HG in hypertensive participants. < 130/82mm HG in hypertensive participants with diabetes, heart failure or chronic kidney disease.;Long Term: Maintenance of blood pressure at goal levels.   Lipids Yes   Intervention Provide education and support for participant on nutrition & aerobic/resistive exercise along with prescribed medications to achieve LDL 70mg , HDL >40mg .   Expected Outcomes Short Term: Participant states understanding of desired cholesterol values and is compliant with medications prescribed. Participant is following exercise prescription and nutrition guidelines.;Long Term: Cholesterol controlled with medications as prescribed, with individualized exercise RX and with personalized nutrition plan. Value goals: LDL < 70mg , HDL > 40 mg.   Personal Goal Other Yes   Personal Goal short: Be able to do more activities with  less fatigue  Long: Be able to climb stairs with less SOB and lose 20lbs   Intervention Provide nutrition and education counseling to assist with increaseing exercise capacity, losing weight and increasing energy levels   Expected Outcomes Pt will be able to lose wt, do more activities and be able to climb stairs without difficulties      Core Components/Risk Factors/Patient Goals Review:    Core Components/Risk Factors/Patient Goals at Discharge (Final Review):    ITP Comments:     ITP Comments    Row Name 12/17/15 0819           ITP Comments Dr. Fransico Him, Medical Director          Comments: Pt is making expected progress toward personal goals after completing  8 sessions. Recommend  continued exercise and life style modification education including  stress management and relaxation techniques to decrease cardiac risk profile.

## 2016-01-01 NOTE — Progress Notes (Signed)
Angela Bond 70 y.o. female Nutrition Note Spoke with pt. Nutrition Survey reviewed with pt. Pt is following Step 2 of the Therapeutic Lifestyle Changes diet. Pt expressed understanding of the information reviewed. Pt aware of nutrition education classes offered. Lab Results  Component Value Date   HGBA1C 5.5 11/05/2015   Wt Readings from Last 3 Encounters:  12/17/15 163 lb 2.3 oz (74 kg)  11/19/15 162 lb 12.8 oz (73.8 kg)  11/06/15 177 lb 14.6 oz (80.7 kg)   Nutrition Diagnosis ? Food-and nutrition-related knowledge deficit related to lack of exposure to information as related to diagnosis of: ? CVD ?  Nutrition Intervention ? Benefits of adopting Therapeutic Lifestyle Changes discussed when Medficts reviewed. ? Pt to attend the Portion Distortion class ? Pt to attend the  ? Nutrition I class                      ? Nutrition II class ? Pt given handouts for: ? Nutrition I class ? Nutrition II class  ? Continue client-centered nutrition education by RD, as part of interdisciplinary care.  Goal(s) ? Pt to identify food quantities necessary to achieve weight loss of 6-24 lb (2.7-10.9 kg) at graduation from cardiac rehab.   Monitor and Evaluate progress toward nutrition goal with team.  Derek Mound, M.Ed, RD, LDN, CDE 01/01/2016 2:35 PM

## 2016-01-04 ENCOUNTER — Encounter (HOSPITAL_COMMUNITY)
Admission: RE | Admit: 2016-01-04 | Discharge: 2016-01-04 | Disposition: A | Discharge status: Home / Self Care | Payer: Medicare Other | Source: Ambulatory Visit | Attending: Cardiology | Admitting: Cardiology

## 2016-01-04 DIAGNOSIS — Z955 Presence of coronary angioplasty implant and graft: Secondary | ICD-10-CM

## 2016-01-04 DIAGNOSIS — I214 Non-ST elevation (NSTEMI) myocardial infarction: Secondary | ICD-10-CM | POA: Diagnosis not present

## 2016-01-04 NOTE — Progress Notes (Signed)
Reviewed home exercise with pt today.  Pt plans to walk for exercise, 2x/week in addition to coming to cardiac rehab.  Reviewed THR, pulse, RPE, sign and symptoms, NTG use, and when to call 911 or MD.  Also discussed weather considerations and indoor options.  Pt voiced understanding.    Vonda Harth,MS,ACSM RCEP 

## 2016-01-06 ENCOUNTER — Encounter (HOSPITAL_COMMUNITY)
Admission: RE | Admit: 2016-01-06 | Discharge: 2016-01-06 | Disposition: A | Discharge status: Home / Self Care | Payer: Medicare Other | Source: Ambulatory Visit | Attending: Cardiology | Admitting: Cardiology

## 2016-01-06 DIAGNOSIS — I214 Non-ST elevation (NSTEMI) myocardial infarction: Secondary | ICD-10-CM

## 2016-01-06 DIAGNOSIS — Z955 Presence of coronary angioplasty implant and graft: Secondary | ICD-10-CM

## 2016-01-07 ENCOUNTER — Telehealth: Payer: Self-pay | Admitting: Cardiology

## 2016-01-07 NOTE — Telephone Encounter (Signed)
New message        Did you get her cholesteral results from 12-30-15 Dr Michiel Cowboy office?  If yes, can Dr Marlou Porch discuss the results with her on the 9-12-ov?

## 2016-01-07 NOTE — Telephone Encounter (Signed)
Called and left a message that we have not received lab results from their office.  Requested they re-fax and left fax number.

## 2016-01-08 ENCOUNTER — Encounter (HOSPITAL_COMMUNITY)
Admission: RE | Admit: 2016-01-08 | Discharge: 2016-01-08 | Disposition: A | Discharge status: Home / Self Care | Payer: Medicare Other | Source: Ambulatory Visit | Attending: Cardiology | Admitting: Cardiology

## 2016-01-08 DIAGNOSIS — I214 Non-ST elevation (NSTEMI) myocardial infarction: Secondary | ICD-10-CM

## 2016-01-08 DIAGNOSIS — Z955 Presence of coronary angioplasty implant and graft: Secondary | ICD-10-CM

## 2016-01-11 ENCOUNTER — Encounter (HOSPITAL_COMMUNITY)
Admission: RE | Admit: 2016-01-11 | Discharge: 2016-01-11 | Disposition: A | Discharge status: Home / Self Care | Payer: Medicare Other | Source: Ambulatory Visit | Attending: Cardiology | Admitting: Cardiology

## 2016-01-11 DIAGNOSIS — I214 Non-ST elevation (NSTEMI) myocardial infarction: Secondary | ICD-10-CM | POA: Diagnosis not present

## 2016-01-11 DIAGNOSIS — Z955 Presence of coronary angioplasty implant and graft: Secondary | ICD-10-CM

## 2016-01-12 ENCOUNTER — Telehealth: Payer: Self-pay | Admitting: Cardiology

## 2016-01-12 ENCOUNTER — Encounter: Payer: Self-pay | Admitting: Cardiology

## 2016-01-12 NOTE — Telephone Encounter (Signed)
New message:   Please call her,concerning Angela Bond.

## 2016-01-12 NOTE — Telephone Encounter (Signed)
Reviewed with Katharine Look that pt's recent labs have been received and a note has been put into the schedule notes for lab to be reviewed by Dr Marlou Porch at that time.  She thanked me for my assistance.

## 2016-01-13 ENCOUNTER — Telehealth: Payer: Self-pay | Admitting: *Deleted

## 2016-01-13 ENCOUNTER — Encounter (HOSPITAL_COMMUNITY)
Admission: RE | Admit: 2016-01-13 | Discharge: 2016-01-13 | Disposition: A | Discharge status: Home / Self Care | Payer: Medicare Other | Source: Ambulatory Visit | Attending: Cardiology | Admitting: Cardiology

## 2016-01-13 DIAGNOSIS — Z955 Presence of coronary angioplasty implant and graft: Secondary | ICD-10-CM

## 2016-01-13 DIAGNOSIS — I214 Non-ST elevation (NSTEMI) myocardial infarction: Secondary | ICD-10-CM

## 2016-01-13 NOTE — Telephone Encounter (Signed)
-----   Message from Jerline Pain, MD sent at 01/13/2016  5:45 PM EDT ----- Regarding: RE: cardiac rehab/ stop lisinopril Please stop lisinopril Candee Furbish, MD  ----- Message ----- From: Lowell Guitar, RN Sent: 01/13/2016   5:16 PM To: Jerline Pain, MD Subject: cardiac rehab                                  Dear Dr. Marlou Porch,  Pt was hypotensive post exercise today at cardiac rehab.  Post exercise BP:  88/60.  Pt given gatorade.  Recheck BP:  94/58.  Pt c/o dizziness.  Pt instructed to increase PO intake prior to exercise.  Thank you, Andi Hence, RN, BSN Cardiac Pulmonary Rehab

## 2016-01-13 NOTE — Telephone Encounter (Signed)
Attempted to call pt to give instructions for medication change.  lM to c/b to for instructions.

## 2016-01-14 NOTE — Telephone Encounter (Signed)
Spoke with patient who reports she stopped lisinopril after last office visit.  She to have dizziness everyday.  She does not know what her BP is today as she has not taken it and is not at home to do so.  Reviewed with Dr Marlou Porch who gave verbal orders to d/c metoprolol 25 mg 1/2 tablet BID.  Pt is aware and stated understanding to stop metoprolol.  She is aware to continue to monitor her BP and maintain adequate hydration.  She will call back if further issues or concerns.  She will continue cardiac rehab and keep appt with Dr Marlou Porch as scheduled 9/12.

## 2016-01-15 ENCOUNTER — Emergency Department (HOSPITAL_COMMUNITY): Payer: Medicare Other

## 2016-01-15 ENCOUNTER — Other Ambulatory Visit: Payer: Self-pay

## 2016-01-15 ENCOUNTER — Encounter (HOSPITAL_COMMUNITY)
Admission: RE | Admit: 2016-01-15 | Discharge: 2016-01-15 | Disposition: A | Discharge status: Home / Self Care | Payer: Medicare Other | Source: Ambulatory Visit | Attending: Cardiology | Admitting: Cardiology

## 2016-01-15 ENCOUNTER — Encounter (HOSPITAL_COMMUNITY): Payer: Self-pay | Admitting: Vascular Surgery

## 2016-01-15 ENCOUNTER — Ambulatory Visit (HOSPITAL_COMMUNITY)
Admission: RE | Admit: 2016-01-15 | Discharge: 2016-01-15 | Disposition: A | Discharge status: Home / Self Care | Payer: Medicare Other | Source: Ambulatory Visit | Attending: Cardiology | Admitting: Cardiology

## 2016-01-15 ENCOUNTER — Observation Stay (HOSPITAL_COMMUNITY)
Admission: EM | Admit: 2016-01-15 | Discharge: 2016-01-16 | Disposition: A | Discharge status: Home / Self Care | Payer: Medicare Other | Attending: Cardiovascular Disease | Admitting: Cardiovascular Disease

## 2016-01-15 DIAGNOSIS — R072 Precordial pain: Principal | ICD-10-CM | POA: Insufficient documentation

## 2016-01-15 DIAGNOSIS — F419 Anxiety disorder, unspecified: Secondary | ICD-10-CM | POA: Insufficient documentation

## 2016-01-15 DIAGNOSIS — I509 Heart failure, unspecified: Secondary | ICD-10-CM | POA: Diagnosis not present

## 2016-01-15 DIAGNOSIS — Z96653 Presence of artificial knee joint, bilateral: Secondary | ICD-10-CM | POA: Insufficient documentation

## 2016-01-15 DIAGNOSIS — Z853 Personal history of malignant neoplasm of breast: Secondary | ICD-10-CM | POA: Diagnosis not present

## 2016-01-15 DIAGNOSIS — Z955 Presence of coronary angioplasty implant and graft: Secondary | ICD-10-CM | POA: Insufficient documentation

## 2016-01-15 DIAGNOSIS — I2510 Atherosclerotic heart disease of native coronary artery without angina pectoris: Secondary | ICD-10-CM

## 2016-01-15 DIAGNOSIS — Z880 Allergy status to penicillin: Secondary | ICD-10-CM | POA: Insufficient documentation

## 2016-01-15 DIAGNOSIS — Z7982 Long term (current) use of aspirin: Secondary | ICD-10-CM | POA: Diagnosis not present

## 2016-01-15 DIAGNOSIS — I110 Hypertensive heart disease with heart failure: Secondary | ICD-10-CM | POA: Insufficient documentation

## 2016-01-15 DIAGNOSIS — I951 Orthostatic hypotension: Secondary | ICD-10-CM | POA: Diagnosis present

## 2016-01-15 DIAGNOSIS — N390 Urinary tract infection, site not specified: Secondary | ICD-10-CM | POA: Diagnosis not present

## 2016-01-15 DIAGNOSIS — R0789 Other chest pain: Secondary | ICD-10-CM

## 2016-01-15 DIAGNOSIS — I255 Ischemic cardiomyopathy: Secondary | ICD-10-CM | POA: Diagnosis not present

## 2016-01-15 DIAGNOSIS — M1990 Unspecified osteoarthritis, unspecified site: Secondary | ICD-10-CM | POA: Diagnosis not present

## 2016-01-15 DIAGNOSIS — K219 Gastro-esophageal reflux disease without esophagitis: Secondary | ICD-10-CM | POA: Insufficient documentation

## 2016-01-15 DIAGNOSIS — I959 Hypotension, unspecified: Secondary | ICD-10-CM | POA: Diagnosis not present

## 2016-01-15 DIAGNOSIS — E7800 Pure hypercholesterolemia, unspecified: Secondary | ICD-10-CM | POA: Insufficient documentation

## 2016-01-15 DIAGNOSIS — Z8249 Family history of ischemic heart disease and other diseases of the circulatory system: Secondary | ICD-10-CM | POA: Diagnosis not present

## 2016-01-15 DIAGNOSIS — Z7902 Long term (current) use of antithrombotics/antiplatelets: Secondary | ICD-10-CM | POA: Insufficient documentation

## 2016-01-15 DIAGNOSIS — I200 Unstable angina: Secondary | ICD-10-CM | POA: Diagnosis present

## 2016-01-15 DIAGNOSIS — R079 Chest pain, unspecified: Secondary | ICD-10-CM

## 2016-01-15 DIAGNOSIS — I252 Old myocardial infarction: Secondary | ICD-10-CM | POA: Insufficient documentation

## 2016-01-15 DIAGNOSIS — Z79899 Other long term (current) drug therapy: Secondary | ICD-10-CM | POA: Insufficient documentation

## 2016-01-15 DIAGNOSIS — I214 Non-ST elevation (NSTEMI) myocardial infarction: Secondary | ICD-10-CM | POA: Insufficient documentation

## 2016-01-15 DIAGNOSIS — I10 Essential (primary) hypertension: Secondary | ICD-10-CM | POA: Insufficient documentation

## 2016-01-15 HISTORY — DX: Orthostatic hypotension: I95.1

## 2016-01-15 LAB — URINALYSIS, ROUTINE W REFLEX MICROSCOPIC
Bilirubin Urine: NEGATIVE
GLUCOSE, UA: NEGATIVE mg/dL
Ketones, ur: NEGATIVE mg/dL
Nitrite: NEGATIVE
PH: 7 (ref 5.0–8.0)
Protein, ur: NEGATIVE mg/dL
SPECIFIC GRAVITY, URINE: 1.005 (ref 1.005–1.030)

## 2016-01-15 LAB — BASIC METABOLIC PANEL
ANION GAP: 10 (ref 5–15)
BUN: 10 mg/dL (ref 6–20)
CALCIUM: 8.9 mg/dL (ref 8.9–10.3)
CO2: 26 mmol/L (ref 22–32)
Chloride: 100 mmol/L — ABNORMAL LOW (ref 101–111)
Creatinine, Ser: 0.92 mg/dL (ref 0.44–1.00)
GFR calc Af Amer: >60 mL/min (ref >60)
GFR calc non Af Amer: >60 mL/min (ref >60)
GLUCOSE: 90 mg/dL (ref 65–99)
POTASSIUM: 4.4 mmol/L (ref 3.5–5.1)
Sodium: 136 mmol/L (ref 135–145)

## 2016-01-15 LAB — CBC
HEMATOCRIT: 35.1 % — AB (ref 36.0–46.0)
HEMOGLOBIN: 11.3 g/dL — AB (ref 12.0–15.0)
MCH: 30 pg (ref 26.0–34.0)
MCHC: 32.2 g/dL (ref 30.0–36.0)
MCV: 93.1 fL (ref 78.0–100.0)
Platelets: 183 10*3/uL (ref 150–400)
RBC: 3.77 MIL/uL — ABNORMAL LOW (ref 3.87–5.11)
RDW: 13.9 % (ref 11.5–15.5)
WBC: 6.9 10*3/uL (ref 4.0–10.5)

## 2016-01-15 LAB — URINE MICROSCOPIC-ADD ON: Squamous Epithelial / LPF: NONE SEEN

## 2016-01-15 LAB — I-STAT TROPONIN, ED: Troponin i, poc: 0.01 ng/mL (ref 0.00–0.08)

## 2016-01-15 MED ORDER — CLOPIDOGREL BISULFATE 75 MG PO TABS
75.0000 mg | ORAL_TABLET | Freq: Every day | ORAL | Status: DC
  Administered 2016-01-16: 75 mg via ORAL
  Filled 2016-01-15: qty 1

## 2016-01-15 MED ORDER — NITROGLYCERIN 0.4 MG SL SUBL: 0.4000 mg | SUBLINGUAL_TABLET | SUBLINGUAL | Status: DC | PRN

## 2016-01-15 MED ORDER — ONDANSETRON HCL 4 MG/2ML IJ SOLN: 4.0000 mg | Freq: Four times a day (QID) | INTRAMUSCULAR | Status: DC | PRN

## 2016-01-15 MED ORDER — FLUOXETINE HCL 20 MG PO CAPS
20.0000 mg | ORAL_CAPSULE | Freq: Every day | ORAL | Status: DC
  Administered 2016-01-16: 20 mg via ORAL
  Filled 2016-01-15: qty 1

## 2016-01-15 MED ORDER — LORAZEPAM 1 MG PO TABS
1.0000 mg | ORAL_TABLET | Freq: Every day | ORAL | Status: DC
  Administered 2016-01-15: 1 mg via ORAL
  Filled 2016-01-15: qty 1

## 2016-01-15 MED ORDER — PANTOPRAZOLE SODIUM 40 MG PO TBEC
40.0000 mg | DELAYED_RELEASE_TABLET | Freq: Every day | ORAL | Status: DC
  Filled 2016-01-15: qty 1

## 2016-01-15 MED ORDER — NITROGLYCERIN 0.4 MG SL SUBL
0.4000 mg | SUBLINGUAL_TABLET | SUBLINGUAL | Status: DC | PRN
  Administered 2016-01-15 (×3): 0.4 mg via SUBLINGUAL
  Filled 2016-01-15: qty 1

## 2016-01-15 MED ORDER — ASPIRIN EC 81 MG PO TBEC
81.0000 mg | DELAYED_RELEASE_TABLET | Freq: Every day | ORAL | Status: DC
  Administered 2016-01-16: 81 mg via ORAL
  Filled 2016-01-15: qty 1

## 2016-01-15 MED ORDER — PRAVASTATIN SODIUM 40 MG PO TABS: 40.0000 mg | ORAL_TABLET | Freq: Every day | ORAL | Status: DC

## 2016-01-15 MED ORDER — LORATADINE 10 MG PO TABS
10.0000 mg | ORAL_TABLET | Freq: Every day | ORAL | Status: DC
  Administered 2016-01-16: 10 mg via ORAL
  Filled 2016-01-15: qty 1

## 2016-01-15 MED ORDER — ACETAMINOPHEN 325 MG PO TABS
650.0000 mg | ORAL_TABLET | ORAL | Status: DC | PRN
  Administered 2016-01-15: 650 mg via ORAL
  Filled 2016-01-15: qty 2

## 2016-01-15 MED ORDER — GABAPENTIN 300 MG PO CAPS
300.0000 mg | ORAL_CAPSULE | Freq: Three times a day (TID) | ORAL | Status: DC
  Administered 2016-01-15 – 2016-01-16 (×3): 300 mg via ORAL
  Filled 2016-01-15 (×3): qty 1

## 2016-01-15 MED ORDER — NITROFURANTOIN MACROCRYSTAL 100 MG PO CAPS
100.0000 mg | ORAL_CAPSULE | Freq: Every day | ORAL | Status: DC
  Filled 2016-01-15 (×2): qty 1

## 2016-01-15 MED ORDER — CALCIUM CARBONATE 1250 (500 CA) MG PO TABS
1250.0000 mg | ORAL_TABLET | Freq: Two times a day (BID) | ORAL | Status: DC
  Administered 2016-01-16: 1250 mg via ORAL
  Filled 2016-01-15: qty 1

## 2016-01-15 MED ORDER — METOPROLOL SUCCINATE ER 25 MG PO TB24
12.5000 mg | ORAL_TABLET | Freq: Every day | ORAL | Status: DC
  Administered 2016-01-15 – 2016-01-16 (×2): 12.5 mg via ORAL
  Filled 2016-01-15 (×2): qty 1

## 2016-01-15 MED ORDER — HEPARIN SODIUM (PORCINE) 5000 UNIT/ML IJ SOLN
5000.0000 [IU] | Freq: Three times a day (TID) | INTRAMUSCULAR | Status: DC
  Administered 2016-01-15 – 2016-01-16 (×3): 5000 [IU] via SUBCUTANEOUS
  Filled 2016-01-15 (×3): qty 1

## 2016-01-15 NOTE — ED Notes (Signed)
Attempted to call report

## 2016-01-15 NOTE — Consult Note (Deleted)
Cardiology Consult    Patient ID: SOFIANA Bond MRN: [id number], DOB/AGE: Jun 13, 1945   Admit date: 01/15/2016 Date of Consult: 01/15/2016  Primary Physician: Simona Huh, MD Reason for Consult: Chest pain  Primary Cardiologist: Dr. Marlou Porch Requesting Provider: Dr. Kathrynn Humble  Patient Profile  Angela Bond is a 70 year old female with a past medical history of CAD s/p NSTEMI in June 2017 with DES to mid LAD, HTN, HLD, and anxiety. She presents to the ED with chest pain that began while at cardiac rehab.   History of Present Illness  Angela Bond was walking at cardiac rehab today and developed left sided chest pain with radiation to her neck and shoulder. She had associated SOB, diaphoresis and nausea. This is reminiscent of the angina that she experienced with her NSTEMI in June of this year. She continues to have chest pain as I talk with her. She tells me that SL Nitro moderately relieved her pain.   This week, 2 days ago she was at cardiac rehab and was hypotensive with BP of 88/60  Post exercise. She was given a Gatorade and her BP rose to 94/58. She was dizzy and diaphoretic at the time. She called our office and her beta blocker (metoprolol 12.5 mg BID) was discontinued. Her lisinopril was discontinued in July of this year as she had some orthostatic hypotension in the office.   Of note, she was seen in the ED on 11/10/15 for chest pain 4 days after she was discharged from her NSTEMI. A GI cocktail relieved her pain in the ED and she was discharged from the ED.   Her last cath was in June of this year, done in the setting of NSTEMI. She had successful DES placement to her mid LAD. There was residual disease in her proximal RCA (30%) and 35% in his ostial 2nd diagonal. She was placed on DAPT with ASA and Brilinta which was switched to Plavix outpatient for cost reasons.   Her EKG shows NSR with no acute ST/T wave changes. Troponin is negative x 1.   Past Medical History   Past  Medical History:  Diagnosis Date  . Anemia    hx  . Anxiety   . Anxiety and depression   . Arthritis    "~ all over my body; in the joints"  . Breast cancer, right ([place]) 3/16  . Coronary artery disease   . Depression   . Dysrhythmia   . GERD (gastroesophageal reflux disease)   . H/O hiatal hernia   . Heart murmur   . History of blood transfusion    "when taking chemo & w/one of my knee replacements" (11/05/2015)  . Hypercholesteremia   . Hypertension   . Pneumonia X 1  . Shingles 5/15   hx  . Shortness of breath    "stomach is in lungs" when had hernia 2014 none now(07/28/14)  . Sinus headache     Past Surgical History:  Procedure Laterality Date  . BACK SURGERY    . BREAST BIOPSY Right   . BREAST LUMPECTOMY WITH SENTINEL LYMPH NODE BIOPSY Right 09/22/2014   Procedure: RIGHT BREAST LUMPECTOMY WITH NEEDLE LOCALIZATION AND RIGHT AXILLARY SENTINEL LYMPH NODE BIOPSY;  Surgeon: [name], MD;  Location: [facility];  Service: General;  Laterality: Right;  . CARDIAC CATHETERIZATION    . CARDIAC CATHETERIZATION N/A 11/05/2015   Procedure: Left Heart Cath and Coronary Angiography;  Surgeon: Belva Crome, MD;  Location: [facility];  Service: Cardiovascular;  Laterality: N/A;  . CARDIAC CATHETERIZATION N/A 11/05/2015   Procedure: Coronary Stent Intervention;  Surgeon: Belva Crome, MD;  Location: [facility] [facility];  Service: Cardiovascular;  Laterality: N/A;  . CARPAL TUNNEL RELEASE Right 09/2015  . CORONARY ANGIOPLASTY WITH STENT PLACEMENT  11/05/2015   "1 stent"  . ESOPHAGEAL MANOMETRY N/A 07/02/2012   Procedure: ESOPHAGEAL MANOMETRY (EM);  Surgeon: Lear Ng, MD;  [facility];  Service: Endoscopy;  Laterality: N/A;  schooler to read  . EYE SURGERY Left 2013   tube placed tear duct  . HERNIA REPAIR    . HIATAL HERNIA REPAIR N/A 08/10/2012   Procedure: LAPAROSCOPIC REPAIR OF HIATAL HERNIA WITH MESH AND EGD WITH PEG TUBE PLACEMENT;  Surgeon: Ralene Ok,  MD;  [facility]: [facility];  Service: General;  Laterality: N/A;  . JOINT REPLACEMENT    . PORT-A-CATH REMOVAL  06/2015  . PORTACATH PLACEMENT Left 09/22/2014   Procedure: INSERTION PORT-A-CATH;  Surgeon: [name], MD;  Location: [address];  Service: General;  Laterality: Left;  . POSTERIOR LUMBAR FUSION  2016   "had 2 vertebrae fused"  . TONSILLECTOMY  as child  . TOTAL KNEE ARTHROPLASTY Bilateral 2011,2012     Allergies  Allergies  Allergen Reactions  . Shellfish Allergy Anaphylaxis    ANAPHYLAXIS TO OYSTERS  . Lactose Intolerance (Gi) Other (See Comments)  . Penicillins Nausea And Vomiting    Has patient had a PCN reaction causing immediate rash, facial/tongue/throat swelling, SOB or lightheadedness with hypotension: Yes Has patient had a PCN reaction causing severe rash involving mucus membranes or skin necrosis: No Has patient had a PCN reaction that required hospitalization No Has patient had a PCN reaction occurring within the last 10 years: No If all of the above answers are "NO", then may proceed with Cephalosporin use.   Angela Bond [Naproxen] Nausea And Vomiting  . Aspirin Other (See Comments)    Burns stomach  . Ciprofloxacin Nausea And Vomiting  . Doxycycline Nausea And Vomiting  . Evista [Raloxifene] Other (See Comments)    LEG ACHES  . Fosamax [Alendronate Sodium] Other (See Comments)    Leg aches  . Latex Rash  . Naproxen Sodium Nausea And Vomiting  . Pravachol [Pravastatin] Other (See Comments)    GI upset   . Sulfa Antibiotics Nausea Only  . Tape Rash  . Welchol [Colesevelam Hcl] Other (See Comments)    Stomach cramps  . Zocor [Simvastatin] Rash    Inpatient Medications       Family History    Family History  Problem Relation Age of Onset  . Heart disease Mother     81s, CHF  . Heart disease Father     d/o MI at 58  . Cancer Father     skin  . Diabetes Father   . Cancer Sister     breast  . Diabetes Sister   . Heart disease Sister     CABG in  mid-60s    Social History    Social History   Social History  . Marital status: Married    Spouse name: N/A  . Number of children: 1  . Years of education: N/A   Occupational History  . Not on file.   Social History Main Topics  . Smoking status: Never Smoker  . Smokeless tobacco: Never Used  . Alcohol use No  . Drug use: No  . Sexual activity: No   Other Topics Concern  .  Not on file   Social History Narrative   Lives with husband.  Does not use assist device.       Review of Systems    General:  No chills, fever, night sweats or weight changes.  Cardiovascular:  + chest pain, dyspnea on exertion, edema, orthopnea, palpitations, paroxysmal nocturnal dyspnea. Dermatological: No rash, lesions/masses Respiratory: No cough, dyspnea Urologic: No hematuria, dysuria Abdominal:   + nausea, vomiting, diarrhea, bright red blood per rectum, melena, or hematemesis Neurologic:  No visual changes, wkns, changes in mental status. All other systems reviewed and are otherwise negative except as noted above.  Physical Exam    Blood pressure 142/98, pulse 60, temperature 98.7 F (37.1 C), temperature source Oral, resp. rate 20, SpO2 100 %.  General: Pleasant, NAD Psych: Normal affect. Neuro: Alert and oriented X 3. Moves all extremities spontaneously. HEENT: Normal  Neck: Supple without bruits or JVD. Lungs:  Resp regular and unlabored, CTA. Heart: RRR no s3, s4, or murmurs. Abdomen: Soft, non-tender, non-distended, BS + x 4.  Extremities: No clubbing, cyanosis or edema. DP/PT/Radials 2+ and equal bilaterally.  Labs    Troponin [facility] of Care Test)  Recent Labs  01/15/16 1521  TROPIPOC 0.01    Lab Results  Component Value Date   WBC 6.9 01/15/2016   HGB 11.3 (L) 01/15/2016   HCT 35.1 (L) 01/15/2016   MCV 93.1 01/15/2016   PLT 183 01/15/2016    Recent Labs Lab 01/15/16 1510  NA 136  K 4.4  CL 100*  CO2 26  BUN 10  CREATININE 0.92  CALCIUM 8.9  GLUCOSE  90     Radiology Studies    Dg Chest 2 View  Result Date: 01/15/2016 CLINICAL DATA:  Chest pressure EXAM: CHEST  2 VIEW COMPARISON:  November 10, 2015 FINDINGS: There is no edema or consolidation. The heart size and pulmonary vascularity are normal. No adenopathy. There are surgical clips the right axillary region. No bone lesions are evident. IMPRESSION: No edema or consolidation. Electronically Signed   By: Lowella Grip III M.D.   On: 01/15/2016 15:46    EKG & Cardiac Imaging    EKG: NSR    Assessment & Plan  1. Chest pain: Presents with angina that occurred while walking with cardiac rehab. She had associated symptoms of nausea, diaphoresis and SOB. Pain improved with rest. Her troponin is negative thus far. EKG not concerning for ischemia.   Will cycle troponin and follow. She would benefit from a good anti-anginal regimen as she has some residual disease in her RCA and 2nd diagonal. However, she has had some recent hypotension.   BP today is in the 120-140 range. We can try to add 15mg  isosorbide and see if her BP tolerates. She had a reduced EF (35-40) by ventriculogram at cath in June. Would do Echo while admitted to see if EF has improved. She has not had one yet.   She has not missed any doses of her Plavix or ASA.   2. Hypotension: Improved. Will follow.   3. Dysuria: UA pending. Will defer to primary team.   Signed, Arbutus Leas, NP 01/15/2016, 4:08 PM Pager: [phone]

## 2016-01-15 NOTE — ED Notes (Signed)
MD at bedside. 

## 2016-01-15 NOTE — ED Provider Notes (Addendum)
[facility] DEPT Provider Note   CSN: [id number] Arrival date & time: 01/15/16  1431     History   Chief Complaint Chief Complaint  Patient presents with  . Chest Pain    HPI Angela Bond is a 70 y.o. female.  HPI  70 year old female with CAD, s/p PCI in LAD in June comes in with cc of chest pain. Her chest pain is midsternal, and radiates to her L neck and shoulder. Pain is pressure type pain and similar to her ACS pain. She was at the cardiac rehab walking when the pain started. Pt had nausea, dib and sweating with the pain. Her pain is 5/10 at the moment.   Past Medical History:  Diagnosis Date  . Anemia    hx  . Anxiety   . Anxiety and depression   . Arthritis    "~ all over my body; in the joints"  . Breast cancer, right ([place]) 3/16  . Coronary artery disease   . Depression   . Dysrhythmia   . GERD (gastroesophageal reflux disease)   . H/O hiatal hernia   . Heart murmur   . History of blood transfusion    "when taking chemo & w/one of my knee replacements" (11/05/2015)  . Hypercholesteremia   . Hypertension   . Pneumonia X 1  . Shingles 5/15   hx  . Shortness of breath    "stomach is in lungs" when had hernia 2014 none now(07/28/14)  . Sinus headache     Patient Active Problem List   Diagnosis Date Noted  . Hypotension 11/19/2015  . Cardiomyopathy, ischemic 11/19/2015  . Esophageal reflux   . Atypical chest pain   . ACS (acute coronary syndrome) ([place]) 11/05/2015  . NSTEMI (non-ST elevated myocardial infarction) ([place]) 11/05/2015  . Ataxia 09/15/2015  . Neutropenic fever ([place]) 12/24/2014  . Dehydration 12/24/2014  . Neutropenia with fever ([place]) 12/23/2014  . Anemia due to chemotherapy 12/16/2014  . Neutropenia ([place]) 10/21/2014  . UTI (lower urinary tract infection) 10/21/2014  . Hypokalemia 10/21/2014  . Fall 10/16/2014  . Breast cancer of upper-outer quadrant of right female breast ([place]) 09/09/2014  . Lumbar spinal stenosis 07/31/2014  .  Shingles 09/23/2013  . Chest pressure 09/20/2013  . Hypotension due to drugs 09/20/2013  . GERD (gastroesophageal reflux disease)   . Depression   . Hypercholesteremia   . Arthritis   . Abdominal pain, unspecified site 10/12/2012  . Atrial fibrillation ([place]) 08/12/2012  . Hypertension   . Sliding hiatal hernia s/p lap PEH repair 07/31/2012  . Other and unspecified angina pectoris 01/23/2012    Past Surgical History:  Procedure Laterality Date  . BACK SURGERY    . BREAST BIOPSY Right   . BREAST LUMPECTOMY WITH SENTINEL LYMPH NODE BIOPSY Right 09/22/2014   Procedure: RIGHT BREAST LUMPECTOMY WITH NEEDLE LOCALIZATION AND RIGHT AXILLARY SENTINEL LYMPH NODE BIOPSY;  Surgeon: [name], MD;  Location: [facility];  Service: General;  Laterality: Right;  . CARDIAC CATHETERIZATION    . CARDIAC CATHETERIZATION N/A 11/05/2015   Procedure: Left Heart Cath and Coronary Angiography;  Surgeon: Belva Crome, MD;  Location: [facility];  Service: Cardiovascular;  Laterality: N/A;  . CARDIAC CATHETERIZATION N/A 11/05/2015   Procedure: Coronary Stent Intervention;  Surgeon: Belva Crome, MD;  Location: [facility];  Service: Cardiovascular;  Laterality: N/A;  . CARPAL TUNNEL RELEASE Right 09/2015  . CORONARY ANGIOPLASTY WITH STENT PLACEMENT  11/05/2015   "1 stent"  .  ESOPHAGEAL MANOMETRY N/A 07/02/2012   Procedure: ESOPHAGEAL MANOMETRY (EM);  Surgeon: Lear Ng, MD;  [facility];  Service: Endoscopy;  Laterality: N/A;  schooler to read  . EYE SURGERY Left 2013   tube placed tear duct  . HERNIA REPAIR    . HIATAL HERNIA REPAIR N/A 08/10/2012   Procedure: LAPAROSCOPIC REPAIR OF HIATAL HERNIA WITH MESH AND EGD WITH PEG TUBE PLACEMENT;  Surgeon: Ralene Ok, MD;  [facility];  Service: General;  Laterality: N/A;  . JOINT REPLACEMENT    . PORT-A-CATH REMOVAL  06/2015  . PORTACATH PLACEMENT Left 09/22/2014   Procedure: INSERTION PORT-A-CATH;  Surgeon: [name], MD;   Location: [facility];  Service: General;  Laterality: Left;  . POSTERIOR LUMBAR FUSION  2016   "had 2 vertebrae fused"  . TONSILLECTOMY  as child  . TOTAL KNEE ARTHROPLASTY Bilateral 2011,2012    OB History    No data available       Home Medications    Prior to Admission medications   Medication Sig Start Date End Date Taking? Authorizing Provider  aspirin EC 81 MG EC tablet Take 1 tablet (81 mg total) by mouth daily. 11/06/15  Yes Brittainy M Simmons, PA-C  calcium carbonate (OS-CAL) 600 MG TABS Take 600 mg by mouth 2 (two) times daily with a meal.   Yes Historical Provider, MD  carboxymethylcellulose (REFRESH PLUS) 0.5 % SOLN Place 1 drop into both eyes QID.    Yes Historical Provider, MD  Cholecalciferol (VITAMIN D) 2000 UNITS tablet Take 2,000 Units by mouth 2 (two) times daily.    Yes Historical Provider, MD  clopidogrel (PLAVIX) 75 MG tablet Take 1 tablet (75 mg total) by mouth daily. Take (4) tablets the first day a then (1) tablet a day thereafter Patient taking differently: Take 75 mg by mouth daily.  12/10/15  Yes Jerline Pain, MD  dexlansoprazole (DEXILANT) 60 MG capsule Take 60 mg by mouth daily.   Yes Historical Provider, MD  fexofenadine (ALLEGRA) 180 MG tablet Take 180 mg by mouth daily.   Yes Historical Provider, MD  FLUoxetine (PROZAC) 20 MG capsule Take 20 mg by mouth daily. 06/24/14  Yes Historical Provider, MD  gabapentin (NEURONTIN) 300 MG capsule Take 1 capsule by mouth 3  times daily 12/11/15  Yes Max T Hyatt, DPM  LORazepam (ATIVAN) 1 MG tablet Take 1 mg at bedtime and prior to radiation as directed and as needed for anxiety. Patient taking differently: Take 1 mg by mouth at bedtime.  01/29/15  Yes Thea Silversmith, MD  lovastatin (MEVACOR) 40 MG tablet Take 40 mg by mouth at bedtime.    Yes Historical Provider, MD  meclizine (ANTIVERT) 25 MG tablet Take 12.5-25 mg by mouth 3 (three) times daily as needed for dizziness.   Yes Historical Provider, MD  nitrofurantoin  (MACRODANTIN) 100 MG capsule Take 100 mg by mouth at bedtime. 11/10/15  Yes Historical Provider, MD  sodium chloride (OCEAN) 0.65 % SOLN nasal spray Place 2 sprays into both nostrils as needed for congestion.   Yes Historical Provider, MD  nitroGLYCERIN (NITROSTAT) 0.4 MG SL tablet Place 1 tablet (0.4 mg total) under the tongue every 5 (five) minutes as needed for chest pain (CP or SOB). 11/06/15   Brittainy Erie Noe, PA-C    Family History Family History  Problem Relation Age of Onset  . Heart disease Mother     30s, CHF  . Heart disease Father     d/o  MI at 68  . Cancer Father     skin  . Diabetes Father   . Cancer Sister     breast  . Diabetes Sister   . Heart disease Sister     CABG in mid-60s    Social History Social History  Substance Use Topics  . Smoking status: Never Smoker  . Smokeless tobacco: Never Used  . Alcohol use No     Allergies   Shellfish allergy; Lactose intolerance (gi); Penicillins; Aleve [naproxen]; Aspirin; Ciprofloxacin; Doxycycline; Evista [raloxifene]; Fosamax [alendronate sodium]; Latex; Naproxen sodium; Pravachol [pravastatin]; Sulfa antibiotics; Tape; Welchol [colesevelam hcl]; and Zocor [simvastatin]   Review of Systems Review of Systems  ROS 10 Systems reviewed and are negative for acute change except as noted in the HPI.     Physical Exam Updated Vital Signs BP 109/80   Pulse 69   Temp 98.7 F (37.1 C) (Oral)   Resp 14   SpO2 100%   Physical Exam  Constitutional: She is oriented to person, place, and time. She appears well-developed and well-nourished.  HENT:  Head: Normocephalic and atraumatic.  Eyes: EOM are normal. Pupils are equal, round, and reactive to light.  Neck: Neck supple.  Cardiovascular: Normal rate, regular rhythm and normal heart sounds.   No murmur heard. Pulmonary/Chest: Effort normal. No respiratory distress.  Abdominal: Soft. She exhibits no distension. There is no tenderness. There is no rebound and no  guarding.  Neurological: She is alert and oriented to person, place, and time.  Skin: Skin is warm and dry.  Nursing note and vitals reviewed.    ED Treatments / Results  Labs (all labs ordered are listed, but only abnormal results are displayed) Labs Reviewed  BASIC METABOLIC PANEL - Abnormal; Notable for the following:       Result Value   Chloride 100 (*)    All other components within normal limits  CBC - Abnormal; Notable for the following:    RBC 3.77 (*)    Hemoglobin 11.3 (*)    HCT 35.1 (*)    All other components within normal limits  URINALYSIS, ROUTINE W REFLEX MICROSCOPIC (NOT AT [facility]) - Abnormal; Notable for the following:    APPearance CLOUDY (*)    Hgb urine dipstick TRACE (*)    Leukocytes, UA LARGE (*)    All other components within normal limits  URINE MICROSCOPIC-ADD ON - Abnormal; Notable for the following:    Bacteria, UA MANY (*)    All other components within normal limits  URINE CULTURE  I-STAT TROPOININ, ED    EKG  EKG Interpretation None      Date: 01/15/2016  Rate: 75  Rhythm: normal sinus rhythm  QRS Axis: normal  Intervals: normal  ST/T Wave abnormalities: normal  Conduction Disutrbances: none  Narrative Interpretation: unremarkable      Radiology Dg Chest 2 View  Result Date: 01/15/2016 CLINICAL DATA:  Chest pressure EXAM: CHEST  2 VIEW COMPARISON:  November 10, 2015 FINDINGS: There is no edema or consolidation. The heart size and pulmonary vascularity are normal. No adenopathy. There are surgical clips the right axillary region. No bone lesions are evident. IMPRESSION: No edema or consolidation. Electronically Signed   By: Lowella Grip III M.D.   On: 01/15/2016 15:46    Procedures Procedures (including critical care time)  Medications Ordered in ED Medications  nitroGLYCERIN (NITROSTAT) SL tablet 0.4 mg (0.4 mg Sublingual Given 01/15/16 1713)     Initial Impression / Assessment and Plan /  ED Course  I have reviewed the  triage vital signs and the nursing notes.  Pertinent labs & imaging results that were available during my care of the patient were reviewed by me and considered in my medical decision making (see chart for details).  Clinical Course    Differential diagnosis includes: ACS syndrome CHF exacerbation Valvular disorder Myocarditis Pericarditis Musculoskeletal pain PUD  Pt comes in with chest pain, which is typical of her ACS and has some typical features in general of ACS. EKG is reassuring. She is having active chest pain. No clinical concerns for pulmonary or GI etiology. Cardiology consulted. Heparin deferred to Cards. Pt already took antiplatelet. Final Clinical Impressions(s) / ED Diagnoses   Final diagnoses:  Unstable angina [facility])    New Prescriptions New Prescriptions   No medications on file     Varney Biles, MD 01/15/16 [place], MD 01/15/16 1725

## 2016-01-15 NOTE — ED Notes (Signed)
Cards MD and NP at bedside

## 2016-01-15 NOTE — ED Triage Notes (Signed)
Pt reports to the ED for eval of sudden onset of generalized weakness, nausea, and diaphoresis while at cardiac rehab today. Also endorsed some chest pressure. She rated the CP at a 5/10 and after 1 nitro her pain went down to 3/10 but she had worsening dizziness and nausea so the rest was held. Pt is in cardiac rehab for NSTEMI.

## 2016-01-15 NOTE — Progress Notes (Signed)
Pt c/o chest pain, dyspnea and diaphoresis while walking track. Pt reports these symptoms are similar to her anginal equivalent. Rates pain 5/10 describes as pressure. BP: 130/80, 02 sat-98%, 12 lead EKG  Obtained, normal sinus rhythm.  [place], Heart CarePA made aware , verbal order given to give NTG SL.  NTG SL x1 given at 1405.  BP:  130/80.  Pt pain 3/10.  Oxygen 2L via nasal cannula applied.  1410:  Pt reported some relief of symptoms however continued to rate pain 3/10 describing as anginal equivalent. BP:  119/68, 100%,HR-80.  Patient assisted to sitting position however instantly developed dizziness, presyncopal symptoms, generalized weakness and nausea with belching.  Pt returned to lying position.  BP:  110/61 lying, 124/86 sitting.  Pt continued to c/o dizziness, weakness and nausea with chest pressure. Rosaria Ferries, PA made aware with order to take pt to emergency room. Pt taken to ED via stretcher, report given to receiving RN.   Phone call to pt husband Rush Landmark to inform of patient transfer.

## 2016-01-15 NOTE — H&P (Signed)
Cardiology Consult    Patient ID: Angela Bond MRN: [id number], DOB/AGE: 1946-03-29   Admit date: 01/15/2016 Date of Consult: 01/15/2016  Primary Physician: Simona Huh, MD Reason for Consult: Chest pain  Primary Cardiologist: Dr. Marlou Porch Requesting Provider: Dr. Kathrynn Humble  Patient Profile  Angela Bond is a 70 year old female with a past medical history of CAD s/p NSTEMI in June 2017 with DES to mid LAD, HTN, HLD, and anxiety. She presents to the ED with chest pain that began while at cardiac rehab.   History of Present Illness  Angela Bond was walking at cardiac rehab today and developed left sided chest pain with radiation to her neck and shoulder. She had associated SOB, diaphoresis and nausea. This is reminiscent of the angina that she experienced with her NSTEMI in June of this year. She continues to have chest pain as I talk with her. She tells me that SL Nitro moderately relieved her pain.   This week, 2 days ago she was at cardiac rehab and was hypotensive with BP of 88/60  Post exercise. She was given a Gatorade and her BP rose to 94/58. She was dizzy and diaphoretic at the time. She called our office and her beta blocker (metoprolol 12.5 mg BID) was discontinued. Her lisinopril was discontinued in July of this year as she had some orthostatic hypotension in the office.   Of note, she was seen in the ED on 11/10/15 for chest pain 4 days after she was discharged from her NSTEMI. A GI cocktail relieved her pain in the ED and she was discharged from the ED.   Her last cath was in June of this year, done in the setting of NSTEMI. She had successful DES placement to her mid LAD. There was residual disease in her proximal RCA (30%) and 35% in his ostial 2nd diagonal. She was placed on DAPT with ASA and Brilinta which was switched to Plavix outpatient for cost reasons.   Her EKG shows NSR with no acute ST/T wave changes. Troponin is negative x 1.   Past Medical History   Past  Medical History:  Diagnosis Date  . Anemia    hx  . Anxiety   . Anxiety and depression   . Arthritis    "~ all over my body; in the joints"  . Breast cancer, right ([place]) 3/16  . Coronary artery disease   . Depression   . Dysrhythmia   . GERD (gastroesophageal reflux disease)   . H/O hiatal hernia   . Heart murmur   . History of blood transfusion    "when taking chemo & w/one of my knee replacements" (11/05/2015)  . Hypercholesteremia   . Hypertension   . Pneumonia X 1  . Shingles 5/15   hx  . Shortness of breath    "stomach is in lungs" when had hernia 2014 none now(07/28/14)  . Sinus headache     Past Surgical History:  Procedure Laterality Date  . BACK SURGERY    . BREAST BIOPSY Right   . BREAST LUMPECTOMY WITH SENTINEL LYMPH NODE BIOPSY Right 09/22/2014   Procedure: RIGHT BREAST LUMPECTOMY WITH NEEDLE LOCALIZATION AND RIGHT AXILLARY SENTINEL LYMPH NODE BIOPSY;  Surgeon: [name], MD;  Location: [facility];  Service: General;  Laterality: Right;  . CARDIAC CATHETERIZATION    . CARDIAC CATHETERIZATION N/A 11/05/2015   Procedure: Left Heart Cath and Coronary Angiography;  Surgeon: Belva Crome, MD;  Location: [facility];  Service: Cardiovascular;  Laterality: N/A;  . CARDIAC CATHETERIZATION N/A 11/05/2015   Procedure: Coronary Stent Intervention;  Surgeon: Belva Crome, MD;  Location: [facility];  Service: Cardiovascular;  Laterality: N/A;  . CARPAL TUNNEL RELEASE Right 09/2015  . CORONARY ANGIOPLASTY WITH STENT PLACEMENT  11/05/2015   "1 stent"  . ESOPHAGEAL MANOMETRY N/A 07/02/2012   Procedure: ESOPHAGEAL MANOMETRY (EM);  Surgeon: Lear Ng, MD;  Location: [facility];  Service: Endoscopy;  Laterality: N/A;  schooler to read  . EYE SURGERY Left 2013   tube placed tear duct  . HERNIA REPAIR    . HIATAL HERNIA REPAIR N/A 08/10/2012   Procedure: LAPAROSCOPIC REPAIR OF HIATAL HERNIA WITH MESH AND EGD WITH PEG TUBE PLACEMENT;  Surgeon: Ralene Ok,  MD;  Location: [facility];  Service: General;  Laterality: N/A;  . JOINT REPLACEMENT    . PORT-A-CATH REMOVAL  06/2015  . PORTACATH PLACEMENT Left 09/22/2014   Procedure: INSERTION PORT-A-CATH;  Surgeon: [name], MD;  Location: [facility];  Service: General;  Laterality: Left;  . POSTERIOR LUMBAR FUSION  2016   "had 2 vertebrae fused"  . TONSILLECTOMY  as child  . TOTAL KNEE ARTHROPLASTY Bilateral 2011,2012     Allergies  Allergies  Allergen Reactions  . Shellfish Allergy Anaphylaxis    ANAPHYLAXIS TO OYSTERS  . Lactose Intolerance (Gi) Other (See Comments)  . Penicillins Nausea And Vomiting    Has patient had a PCN reaction causing immediate rash, facial/tongue/throat swelling, SOB or lightheadedness with hypotension: Yes Has patient had a PCN reaction causing severe rash involving mucus membranes or skin necrosis: No Has patient had a PCN reaction that required hospitalization No Has patient had a PCN reaction occurring within the last 10 years: No If all of the above answers are "NO", then may proceed with Cephalosporin use.   [name] [Naproxen] Nausea And Vomiting  . Aspirin Other (See Comments)    Burns stomach  . Ciprofloxacin Nausea And Vomiting  . Doxycycline Nausea And Vomiting  . Evista [Raloxifene] Other (See Comments)    LEG ACHES  . Fosamax [Alendronate Sodium] Other (See Comments)    Leg aches  . Latex Rash  . Naproxen Sodium Nausea And Vomiting  . Pravachol [Pravastatin] Other (See Comments)    GI upset   . Sulfa Antibiotics Nausea Only  . Tape Rash  . Welchol [Colesevelam Hcl] Other (See Comments)    Stomach cramps  . Zocor [Simvastatin] Rash    Inpatient Medications       Family History    Family History  Problem Relation Age of Onset  . Heart disease Mother     57s, CHF  . Heart disease Father     d/o MI at 71  . Cancer Father     skin  . Diabetes Father   . Cancer Sister     breast  . Diabetes Sister   . Heart disease Sister     CABG in  mid-60s    Social History    Social History   Social History  . Marital status: Married    Spouse name: N/A  . Number of children: 1  . Years of education: N/A   Occupational History  . Not on file.   Social History Main Topics  . Smoking status: Never Smoker  . Smokeless tobacco: Never Used  . Alcohol use No  . Drug use: No  . Sexual activity: No   Other Topics Concern  .  Not on file   Social History Narrative   Lives with husband.  Does not use assist device.       Review of Systems    General:  No chills, fever, night sweats or weight changes.  Cardiovascular:  + chest pain, dyspnea on exertion, edema, orthopnea, palpitations, paroxysmal nocturnal dyspnea. Dermatological: No rash, lesions/masses Respiratory: No cough, dyspnea Urologic: No hematuria, dysuria Abdominal:   + nausea, vomiting, diarrhea, bright red blood per rectum, melena, or hematemesis Neurologic:  No visual changes, wkns, changes in mental status. All other systems reviewed and are otherwise negative except as noted above.  Physical Exam    Blood pressure 142/98, pulse 60, temperature 98.7 F (37.1 C), temperature source Oral, resp. rate 20, SpO2 100 %.  General: Pleasant, NAD Psych: Normal affect. Neuro: Alert and oriented X 3. Moves all extremities spontaneously. HEENT: Normal  Neck: Supple without bruits or JVD. Lungs:  Resp regular and unlabored, CTA. Heart: RRR no s3, s4, or murmurs. Abdomen: Soft, non-tender, non-distended, BS + x 4.  Extremities: No clubbing, cyanosis or edema. DP/PT/Radials 2+ and equal bilaterally.  Labs    Troponin [facility] Test)  Recent Labs  01/15/16 1521  TROPIPOC 0.01    Lab Results  Component Value Date   WBC 6.9 01/15/2016   HGB 11.3 (L) 01/15/2016   HCT 35.1 (L) 01/15/2016   MCV 93.1 01/15/2016   PLT 183 01/15/2016    Recent Labs Lab 01/15/16 1510  NA 136  K 4.4  CL 100*  CO2 26  BUN 10  CREATININE 0.92  CALCIUM 8.9  GLUCOSE  90     Radiology Studies    Dg Chest 2 View  Result Date: 01/15/2016 CLINICAL DATA:  Chest pressure EXAM: CHEST  2 VIEW COMPARISON:  November 10, 2015 FINDINGS: There is no edema or consolidation. The heart size and pulmonary vascularity are normal. No adenopathy. There are surgical clips the right axillary region. No bone lesions are evident. IMPRESSION: No edema or consolidation. Electronically Signed   By: Lowella Grip III M.D.   On: 01/15/2016 15:46    EKG & Cardiac Imaging    EKG: NSR    Assessment & Plan  1. Chest pain: Presents with angina that occurred while walking with cardiac rehab. She had associated symptoms of nausea, diaphoresis and SOB. Pain improved with rest. Her troponin is negative thus far. EKG not concerning for ischemia.   Will cycle troponin and follow. She would benefit from a good anti-anginal regimen as she has some residual disease in her RCA and 2nd diagonal. However, she has had some recent hypotension.   BP today is in the 120-140 range. We can try to add 12.5 metoprolol XL at bedtime and see if her BP tolerates. She had a reduced EF (35-40) by ventriculogram at cath in June. Would do Echo while admitted to see if EF has improved. She has not had one yet.   She has not missed any doses of her Plavix or ASA.   We will plan for Lexiscan Myoview in the am.   2. Hypotension: Improved. Will follow.   3. Dysuria: UA pending. Will defer to primary team.   Signed, Arbutus Leas, NP 01/15/2016, 4:08 PM Pager: [phone]  Patient seen, examined. Available data reviewed. Agree with findings, assessment, and plan as outlined by Jettie Booze, NP. The patient is independently interviewed and examined. She is an elderly woman in no distress. JVP is normal, carotids are 2+ and equal  without bruits, lungs are clear, heart is regular rate and rhythm without murmur or gallop, abdomen is soft and nontender without bruits and positive bowel sounds, extremities show trace  edema. EKG shows normal sinus rhythm and is within normal limits. Troponin is negative.  The patient's lab data, radiographic data, EKG, and clinical notes are all reviewed. Her cardiac catheterization notes are reviewed. Her symptoms are difficult to sort out. Her PCI was just performed about 2 months ago and it is unlikely that she has restenosis or new coronary plaque rupture as she has been compliant with medical therapy. Because of a multitude of nonspecific symptoms, I think it is best to risk stratify her with a Lexiscan Myoview stress test tomorrow morning. As long as her stress test is low risk, she can be discharged home with outpatient follow-up. She did have significant LV dysfunction noted at the time of her non-STEMI in June and it is possible that there will be anteroapical scar on her stress Myoview, but as long as there is no major area of ischemia I think medical therapy would be reasonable. Will try to minimize her medicines since she does not tolerate much antianginal therapy because of hypotension. Her beta blocker was just held yesterday. Will try to start her back on a low dose of metoprolol succinate 12.5 mg at bedtime.  Sherren Mocha, M.D. 01/15/2016 6:22 PM

## 2016-01-15 NOTE — ED Notes (Signed)
Pt placed on bed pan and peri care done.

## 2016-01-16 ENCOUNTER — Telehealth: Payer: Self-pay | Admitting: Physician Assistant

## 2016-01-16 ENCOUNTER — Observation Stay (HOSPITAL_BASED_OUTPATIENT_CLINIC_OR_DEPARTMENT_OTHER): Payer: Medicare Other

## 2016-01-16 ENCOUNTER — Observation Stay (HOSPITAL_COMMUNITY): Payer: Medicare Other

## 2016-01-16 ENCOUNTER — Encounter (HOSPITAL_COMMUNITY): Payer: Self-pay | Admitting: Physician Assistant

## 2016-01-16 DIAGNOSIS — I10 Essential (primary) hypertension: Secondary | ICD-10-CM

## 2016-01-16 DIAGNOSIS — I255 Ischemic cardiomyopathy: Secondary | ICD-10-CM | POA: Diagnosis not present

## 2016-01-16 DIAGNOSIS — E7800 Pure hypercholesterolemia, unspecified: Secondary | ICD-10-CM | POA: Diagnosis not present

## 2016-01-16 DIAGNOSIS — R079 Chest pain, unspecified: Secondary | ICD-10-CM | POA: Diagnosis not present

## 2016-01-16 DIAGNOSIS — I2510 Atherosclerotic heart disease of native coronary artery without angina pectoris: Secondary | ICD-10-CM | POA: Diagnosis not present

## 2016-01-16 DIAGNOSIS — R072 Precordial pain: Secondary | ICD-10-CM | POA: Diagnosis not present

## 2016-01-16 DIAGNOSIS — N390 Urinary tract infection, site not specified: Secondary | ICD-10-CM

## 2016-01-16 LAB — NM MYOCAR MULTI W/SPECT W/WALL MOTION / EF
CSEPED: 9 min
CSEPEDS: 14 s
CSEPEW: 1 METS
CSEPHR: 68 %
CSEPPHR: 100 {beats}/min
MPHR: 149 {beats}/min
Rest HR: 64 {beats}/min

## 2016-01-16 LAB — BASIC METABOLIC PANEL
ANION GAP: 9 (ref 5–15)
BUN: 9 mg/dL (ref 6–20)
CALCIUM: 8.9 mg/dL (ref 8.9–10.3)
CO2: 27 mmol/L (ref 22–32)
CREATININE: 0.8 mg/dL (ref 0.44–1.00)
Chloride: 104 mmol/L (ref 101–111)
GLUCOSE: 90 mg/dL (ref 65–99)
Potassium: 3.8 mmol/L (ref 3.5–5.1)
Sodium: 140 mmol/L (ref 135–145)

## 2016-01-16 LAB — CBC
HCT: 34.6 % — ABNORMAL LOW (ref 36.0–46.0)
HEMOGLOBIN: 10.7 g/dL — AB (ref 12.0–15.0)
MCH: 29.1 pg (ref 26.0–34.0)
MCHC: 30.9 g/dL (ref 30.0–36.0)
MCV: 94 fL (ref 78.0–100.0)
PLATELETS: 187 10*3/uL (ref 150–400)
RBC: 3.68 MIL/uL — ABNORMAL LOW (ref 3.87–5.11)
RDW: 13.9 % (ref 11.5–15.5)
WBC: 5.4 10*3/uL (ref 4.0–10.5)

## 2016-01-16 LAB — ECHOCARDIOGRAM COMPLETE
Height: 60 in
Weight: 2585.6 oz

## 2016-01-16 LAB — MRSA PCR SCREENING: MRSA by PCR: POSITIVE — AB

## 2016-01-16 MED ORDER — REGADENOSON 0.4 MG/5ML IV SOLN
0.4000 mg | Freq: Once | INTRAVENOUS | Status: AC
Start: 2016-01-16 — End: 2016-01-16
  Administered 2016-01-16: 0.4 mg via INTRAVENOUS
  Filled 2016-01-16: qty 5

## 2016-01-16 MED ORDER — TECHNETIUM TC 99M TETROFOSMIN IV KIT
10.0000 | PACK | Freq: Once | INTRAVENOUS | Status: AC | PRN
  Administered 2016-01-16: 10 via INTRAVENOUS

## 2016-01-16 MED ORDER — METOPROLOL SUCCINATE ER 25 MG PO TB24: 12.5000 mg | ORAL_TABLET | Freq: Every day | ORAL | 3 refills | Status: DC

## 2016-01-16 MED ORDER — CEPHALEXIN 500 MG PO CAPS: 500.0000 mg | ORAL_CAPSULE | Freq: Two times a day (BID) | ORAL | 0 refills | Status: DC

## 2016-01-16 MED ORDER — TECHNETIUM TC 99M TETROFOSMIN IV KIT
30.0000 | PACK | Freq: Once | INTRAVENOUS | Status: AC | PRN
  Administered 2016-01-16: 30 via INTRAVENOUS

## 2016-01-16 MED ORDER — REGADENOSON 0.4 MG/5ML IV SOLN
INTRAVENOUS | Status: AC
  Administered 2016-01-16: 10:00:00
  Filled 2016-01-16: qty 5

## 2016-01-16 MED ORDER — SODIUM CHLORIDE 0.9 % IJ SOLN
80.0000 mg | INTRAVENOUS | Status: AC
  Administered 2016-01-16: 80 mg via INTRAVENOUS

## 2016-01-16 MED ORDER — CLOPIDOGREL BISULFATE 75 MG PO TABS: 75.0000 mg | ORAL_TABLET | Freq: Every day | ORAL | Status: DC

## 2016-01-16 MED ORDER — PANTOPRAZOLE SODIUM 40 MG PO TBEC
80.0000 mg | DELAYED_RELEASE_TABLET | Freq: Every day | ORAL | Status: DC
Start: 2016-01-17 — End: 2016-01-16
  Administered 2016-01-16: 80 mg via ORAL

## 2016-01-16 NOTE — Discharge Summary (Signed)
Discharge Summary    Patient ID: KADYNCE ION,  MRN: [id number], DOB/AGE: 09-04-1945 70 y.o.  Admit date: 01/15/2016 Discharge date: 01/16/2016  Primary Care Provider: Simona Huh Primary Cardiologist: [facility]  Discharge Diagnoses    Principal Problem:   Precordial pain Active Problems:   CAD in native artery   Hypertension   Hypercholesteremia   UTI (lower urinary tract infection)   Hypotension   Cardiomyopathy, ischemic    Diagnostic Studies/Procedures    nuc and echo this adm - see below for summary, EPIC for full reports.  _____________   History of Present Illness & Hospital Course    Ms. Lyon is a 70 year old female with a past medical history of CAD s/p NSTEMI in June 2017 with DES to mid LAD, HTN, orthostatic hypotension, HLD, and anxiety who presented to [facility] with chest pain. Her last cath was in June of this year, done in the setting of NSTEMI. She had successful DES placement to her mid LAD. There was residual disease in her proximal RCA (30%) and 35% in his ostial 2nd diagonal. She was placed on DAPT with ASA and Brilinta which was switched to Plavix outpatient for cost reasons. Of note, she was seen in the ED on 11/10/15 for chest pain 4 days after she was discharged from her NSTEMI. A GI cocktail relieved her pain in the ED and she was discharged from the ED. Her lisinopril was discontinued in July of this year as she had some orthostatic hypotension in the office. She reported compliance with medical therapy.   During one cardiac rehab session this past week, she was hypotensive with BP of 88/60 post exercise. She was given a Gatorade and her BP rose to 94/58. She was dizzy and diaphoretic at the time. She called our office and her beta blocker (metoprolol 12.5 mg BID) was discontinued. She was at cardiac rehab yesterday and developed left sided chest pain with radiation to her neck and shoulder with associated SOB, diaphoresis and nausea, somewhat reminiscent  of prior angina. EKG during pain showed no acute ST/T changes.  Initial troponin was negative.Vitals were stable with BP 142/98 (mildly hypertensive). CXR NAD. Dr. Burt Knack felt her symptoms were difficult to sort out. Her PCI was just performed about 2 months ago and he felt it was unlikely that she has restenosis or new coronary plaque rupture as she has been compliant with medical therapy. Labs otherwise notable for Hgb 10.7 (c/w prior). Per notes from our team, the patient had troponin rechecked down in nuc med and it was negative. She underwent inpatient stress test. This showed septal scar/infarction, no findings for ischemia, EF 68%. It was recommended to try her back on a nightly dose of Toprol 12.5mg  daily.  She already has f/u scheduled with Dr. Marlou Porch 9/12 at 1:45pm. 2D echo will  done prior to dc showed nromal LVF. Dr. Radford Pax has seen and examined the patient today and feels she is stable for discharge. Preliminary read AV sclerosis, normal EF, grade 1 dd.  Other issues for DC:  - The patient also reported dysuria - UA many bacteria, leukocytes, nitrite neg, preliminary culture GNR. Given her age, she was started on Keflex rather than Cipro. She had been on nitrofurantoin in the past but self discontinued this due to a rash. She has f/u with urology already scheduled. I inadvertently initially [facility] - I rerouted rx to [facility] locally, sent msg to our office to call [facility]  when they re-open to cancel that rx.  - Pharmacy left a note that combo of Prozac + Plavix can decrease effectiveness of Plavix. She was informed of that interaction and asked to discuss with her PCP. We also will forward a copy of this DC summary to her primary. She refused to go back on Brilinta due to cost and felt it caused her indigestion.   _____________  Discharge Vitals Blood pressure 120/74, pulse 73, temperature 98.3 F (36.8 C), temperature source Oral, resp. rate 20, height 5' (1.524 m), weight  161 lb 9.6 oz (73.3 kg), SpO2 100 %.  Filed Weights   01/15/16 1953  Weight: 161 lb 9.6 oz (73.3 kg)    Labs & Radiologic Studies    CBC  Recent Labs  01/15/16 1510 01/16/16 0213  WBC 6.9 5.4  HGB 11.3* 10.7*  HCT 35.1* 34.6*  MCV 93.1 94.0  PLT 183 [id number]   Basic Metabolic Panel  Recent Labs  01/15/16 1510 01/16/16 0213  NA 136 140  K 4.4 3.8  CL 100* 104  CO2 26 27  GLUCOSE 90 90  BUN 10 9  CREATININE 0.92 0.80  CALCIUM 8.9 8.9   _____________  Dg Chest 2 View  Result Date: 01/15/2016 CLINICAL DATA:  Chest pressure EXAM: CHEST  2 VIEW COMPARISON:  November 10, 2015 FINDINGS: There is no edema or consolidation. The heart size and pulmonary vascularity are normal. No adenopathy. There are surgical clips the right axillary region. No bone lesions are evident. IMPRESSION: No edema or consolidation. Electronically Signed   By: Lowella Grip III M.D.   On: 01/15/2016 15:46   Nm Myocar Multi W/spect W/wall Motion / Ef  Result Date: 01/16/2016 CLINICAL DATA:  Chest pain and shortness of Breath. EXAM: MYOCARDIAL IMAGING WITH SPECT (REST AND PHARMACOLOGIC-STRESS) GATED LEFT VENTRICULAR WALL MOTION STUDY LEFT VENTRICULAR EJECTION FRACTION TECHNIQUE: Standard myocardial SPECT imaging was performed after resting intravenous injection of 10 mCi Tc-74m tetrofosmin. Subsequently, intravenous infusion of Lexiscan was performed under the supervision of the Cardiology staff. At peak effect of the drug, 30 mCi Tc-68m tetrofosmin was injected intravenously and standard myocardial SPECT imaging was performed. Quantitative gated imaging was also performed to evaluate left ventricular wall motion, and estimate left ventricular ejection fraction. COMPARISON:  09/21/2013 FINDINGS: Perfusion: Fixed defect and he septal area consistent with scar/infarction. No reversible defects to suggest ischemia. Wall Motion: Wall motion abnormality in the area of scar with bulging of the inferoseptal wall during  contraction. Left Ventricular Ejection Fraction: 68 % End diastolic volume 72 ml End systolic volume 23 ml IMPRESSION: 1. Septal scar/infarction.  No findings for ischemia. 2. Septal wall motion abnormality and bulging of the inferoseptal wall with contraction. 3. Left ventricular ejection fraction 68% 4. Non invasive risk stratification*: Intermediate risk.  The the is *2012 Appropriate Use Criteria for Coronary Revascularization Focused Update: J Am Coll Cardiol. N6492421. http://content.airportbarriers.com.aspx?articleid=1201161 Electronically Signed   By: Marijo Sanes M.D.   On: 01/16/2016 14:17   Disposition   Pt is being discharged home today in good condition.  Follow-up Plans & Appointments    Follow-up Warren, MD .   Specialty:  Cardiology Why:  As scheduled below  Contact information: [address] [place] [address] [phone]        Simona Huh, MD .   Specialty:  Family Medicine Why:  Your bloodwork showed mild anemia. You also need to discuss alternative SSRI other than fluoxetine as  this can decrease effectiveness of your blood thinner.  Contact information: [address] [place] [address] [phone]          Discharge Instructions    Diet - low sodium heart healthy    Complete by:  As directed   Increase activity slowly    Complete by:  As directed      Discharge Medications     Medication List    STOP taking these medications   nitrofurantoin 100 MG capsule Commonly known as:  MACRODANTIN     TAKE these medications   aspirin 81 MG EC tablet Take 1 tablet (81 mg total) by mouth daily.   calcium carbonate 600 MG Tabs tablet Commonly known as:  OS-CAL Take 600 mg by mouth 2 (two) times daily with a meal.   carboxymethylcellulose 0.5 % Soln Commonly known as:  REFRESH PLUS Place 1 drop into both eyes QID.   cephALEXin 500 MG capsule Commonly known as:   KEFLEX Take 1 capsule (500 mg total) by mouth 2 (two) times daily.   clopidogrel 75 MG tablet Commonly known as:  PLAVIX Take 1 tablet (75 mg total) by mouth daily.   dexlansoprazole 60 MG capsule Commonly known as:  DEXILANT Take 60 mg by mouth daily.   fexofenadine 180 MG tablet Commonly known as:  ALLEGRA Take 180 mg by mouth daily.   FLUoxetine 20 MG capsule Commonly known as:  PROZAC Take 20 mg by mouth daily.   gabapentin 300 MG capsule Commonly known as:  NEURONTIN Take 1 capsule by mouth 3  times daily   LORazepam 1 MG tablet Commonly known as:  ATIVAN Take 1 mg at bedtime and prior to radiation as directed and as needed for anxiety. What changed:  how much to take  how to take this  when to take this  additional instructions   lovastatin 40 MG tablet Commonly known as:  MEVACOR Take 40 mg by mouth at bedtime.   meclizine 25 MG tablet Commonly known as:  ANTIVERT Take 12.5-25 mg by mouth 3 (three) times daily as needed for dizziness.   metoprolol succinate 25 MG 24 hr tablet Commonly known as:  TOPROL-XL Take 0.5 tablets (12.5 mg total) by mouth at bedtime.   nitroGLYCERIN 0.4 MG SL tablet Commonly known as:  NITROSTAT Place 1 tablet (0.4 mg total) under the tongue every 5 (five) minutes as needed for chest pain (CP or SOB).   sodium chloride 0.65 % Soln nasal spray Commonly known as:  OCEAN Place 2 sprays into both nostrils as needed for congestion.   Vitamin D 2000 units tablet Take 2,000 Units by mouth 2 (two) times daily.         Allergies:  Allergies  Allergen Reactions  . Shellfish Allergy Anaphylaxis    ANAPHYLAXIS TO OYSTERS  . Lactose Intolerance (Gi) Other (See Comments)  . Brilinta [Ticagrelor]     Indigestion  . Nitrofurantoin     "broke out down there"  . Penicillins Nausea And Vomiting    Has patient had a PCN reaction causing immediate rash, facial/tongue/throat swelling, SOB or lightheadedness with hypotension: Yes Has  patient had a PCN reaction causing severe rash involving mucus membranes or skin necrosis: No Has patient had a PCN reaction that required hospitalization No Has patient had a PCN reaction occurring within the last 10 years: No If all of the above answers are "NO", then may proceed with Cephalosporin use.   [name] [Naproxen] Nausea  And Vomiting  . Aspirin Other (See Comments)    Burns stomach  . Ciprofloxacin Nausea And Vomiting  . Doxycycline Nausea And Vomiting  . Evista [Raloxifene] Other (See Comments)    LEG ACHES  . Fosamax [Alendronate Sodium] Other (See Comments)    Leg aches  . Latex Rash  . Naproxen Sodium Nausea And Vomiting  . Pravachol [Pravastatin] Other (See Comments)    GI upset   . Sulfa Antibiotics Nausea Only  . Tape Rash  . Welchol [Colesevelam Hcl] Other (See Comments)    Stomach cramps  . Zocor [Simvastatin] Rash      Outstanding Labs/Studies   I will follow up on sensitivity.  Duration of Discharge Encounter   Greater than 30 minutes including physician time.  Signed, [name] PA-C 01/16/2016, 5:06 PM   Agree with disharge summary as outlined by Melina Copa, PA

## 2016-01-16 NOTE — Progress Notes (Signed)
Patient Name: Angela Bond Date of Encounter: 01/16/2016  Active Problems:   Precordial pain   Primary Cardiologist: Dr Marlou Porch  Patient Profile:  70 year old female with a past medical history of CAD s/p NSTEMI in June 2017 with DES to mid LAD, HTN, HLD, and anxiety. She had chest pain at cardiac rehab and was admitted, MV in am  SUBJECTIVE: No more chest pain, no SOB  OBJECTIVE Vitals:   01/15/16 1900 01/15/16 1953 01/16/16 0526 01/16/16 0949  BP:  (!) 126/95 112/62 126/79  Pulse: 66 76 64 62  Resp: 14 18 18 18   Temp:  98.9 F (37.2 C) 98.5 F (36.9 C)   TempSrc:  Oral Oral   SpO2: 96% 99% 97%   Weight:  161 lb 9.6 oz (73.3 kg)    Height:  5' (1.524 m)      Intake/Output Summary (Last 24 hours) at 01/16/16 1030 Last data filed at 01/16/16 0535  Gross per 24 hour  Intake              200 ml  Output              500 ml  Net             -300 ml   Filed Weights   01/15/16 1953  Weight: 161 lb 9.6 oz (73.3 kg)    PHYSICAL EXAM General: Well developed, well nourished, female in no acute distress. Head: Normocephalic, atraumatic.  Neck: Supple without bruits, JVD not elevated. Lungs:  Resp regular and unlabored, CTA. Heart: RRR, S1, S2, no S3, S4, or murmur; no rub. Abdomen: Soft, non-tender, non-distended, BS + x 4.  Extremities: No clubbing, cyanosis, edema.  Neuro: Alert and oriented X 3. Moves all extremities spontaneously. Psych: Normal affect.  LABS: CBC:  Recent Labs  01/15/16 1510 01/16/16 0213  WBC 6.9 5.4  HGB 11.3* 10.7*  HCT 35.1* 34.6*  MCV 93.1 94.0  PLT 183 [id number]   Basic Metabolic Panel:  Recent Labs  01/15/16 1510 01/16/16 0213  NA 136 140  K 4.4 3.8  CL 100* 104  CO2 26 27  GLUCOSE 90 90  BUN 10 9  CREATININE 0.92 0.80  CALCIUM 8.9 8.9    Recent Labs  01/15/16 1521  TROPIPOC 0.01   Urinalysis    Component Value Date/Time   COLORURINE YELLOW 01/15/2016 1634   APPEARANCEUR CLOUDY (A) 01/15/2016 1634   LABSPEC  1.005 01/15/2016 1634   PHURINE 7.0 01/15/2016 1634   GLUCOSEU NEGATIVE 01/15/2016 1634   HGBUR TRACE (A) 01/15/2016 1634   BILIRUBINUR NEGATIVE 01/15/2016 1634   KETONESUR NEGATIVE 01/15/2016 1634   PROTEINUR NEGATIVE 01/15/2016 1634   NITRITE NEGATIVE 01/15/2016 1634   LEUKOCYTESUR LARGE (A) 01/15/2016 1634   BACTERIA MANY 01/15/2016 1634    TELE: SR, seen in nuc med  ECG: 09/02 SR, no acute changes      Radiology/Studies: Dg Chest 2 View Result Date: 01/15/2016 CLINICAL DATA:  Chest pressure EXAM: CHEST  2 VIEW COMPARISON:  November 10, 2015 FINDINGS: There is no edema or consolidation. The heart size and pulmonary vascularity are normal. No adenopathy. There are surgical clips the right axillary region. No bone lesions are evident. IMPRESSION: No edema or consolidation. Electronically Signed   By: Lowella Grip III M.D.   On: 01/15/2016 15:46   Current Medications:  . regadenoson      . aspirin EC  81 mg Oral Daily  . calcium carbonate  1,250 mg Oral BID WC  . clopidogrel  75 mg Oral Daily  . FLUoxetine  20 mg Oral Daily  . gabapentin  300 mg Oral TID  . heparin  5,000 Units Subcutaneous Q8H  . loratadine  10 mg Oral Daily  . LORazepam  1 mg Oral QHS  . metoprolol succinate  12.5 mg Oral Daily  . nitrofurantoin  100 mg Oral QHS  . pantoprazole  40 mg Oral Daily  . pravastatin  40 mg Oral q1800  . regadenoson  0.4 mg Intravenous Once      ASSESSMENT AND PLAN: Active Problems:   Precordial pain - started with exertion, no ECG changes, initial troponin negative. -  Initial troponin negative, will ck another Istat, do MV if negative - per [facility] note: She did have significant LV dysfunction noted at the time of her non-STEMI in June and it is possible that there will be anteroapical scar on her stress Myoview, but as long as there is no major area of ischemia I think medical therapy would be reasonable. Will try to minimize her medicines since she does not tolerate much  antianginal therapy because of hypotension.  - low-dose BB restarted at bedtime last pm - troponinpoc rechecked and was negative, OK to do MV    ?UTI - See UA above, nitrites were negative, but many bacteria and WBCs - MD advise on Cipro x 3 days   Signed, Rosaria Ferries , PA-C 10:30 AM 01/16/2016

## 2016-01-16 NOTE — Telephone Encounter (Signed)
Hi triage, I realized this patient's metoprolol and cephalexin were sent to mail order first before local pharmacy. Can you call them and cancel the cephalexin? I had to re[facility]route to [facility] locally [facility]. [facility] is unreachable on the weekend. Their info in Epic is [place], [place] [phone]. ThxLisbeth Renshaw Abiha Lukehart PA-C

## 2016-01-16 NOTE — Progress Notes (Signed)
  Echocardiogram 2D Echocardiogram has been performed.  Darlina Sicilian M 01/16/2016, 4:45 PM

## 2016-01-16 NOTE — Progress Notes (Signed)
Pt had trop rechecked in nuc med, negative. With negative ez > 12 after onset of pain, ok to do MV  Lexiscan MV performed, 1 day study, [place] to read  Rosaria Ferries, PA-C 01/16/2016 10:31 AM Beeper [phone]

## 2016-01-17 ENCOUNTER — Encounter: Payer: Self-pay | Admitting: Physician Assistant

## 2016-01-17 LAB — URINE CULTURE

## 2016-01-17 NOTE — Progress Notes (Signed)
Hospital urine culture results reviewed. Sensitive to cephalosporins. No change in abx needed. Dayna Dunn PA-C

## 2016-01-19 NOTE — Telephone Encounter (Signed)
Called [facility] and cancelled order for cephalexin.

## 2016-01-20 ENCOUNTER — Encounter (HOSPITAL_COMMUNITY)
Admission: RE | Admit: 2016-01-20 | Discharge: 2016-01-20 | Disposition: A | Discharge status: Home / Self Care | Payer: Medicare Other | Source: Ambulatory Visit | Attending: Cardiology | Admitting: Cardiology

## 2016-01-20 DIAGNOSIS — I214 Non-ST elevation (NSTEMI) myocardial infarction: Secondary | ICD-10-CM

## 2016-01-20 DIAGNOSIS — Z955 Presence of coronary angioplasty implant and graft: Secondary | ICD-10-CM

## 2016-01-20 LAB — POCT I-STAT TROPONIN I: TROPONIN I, POC: 0 ng/mL (ref 0.00–0.08)

## 2016-01-22 ENCOUNTER — Encounter (HOSPITAL_COMMUNITY): Payer: Medicare Other

## 2016-01-22 ENCOUNTER — Telehealth (HOSPITAL_COMMUNITY): Payer: Self-pay | Admitting: Family Medicine

## 2016-01-25 ENCOUNTER — Encounter (HOSPITAL_COMMUNITY)
Admission: RE | Admit: 2016-01-25 | Discharge: 2016-01-25 | Disposition: A | Discharge status: Home / Self Care | Payer: Medicare Other | Source: Ambulatory Visit | Attending: Cardiology | Admitting: Cardiology

## 2016-01-25 ENCOUNTER — Telehealth (HOSPITAL_COMMUNITY): Payer: Self-pay | Admitting: Cardiac Rehabilitation

## 2016-01-25 DIAGNOSIS — I214 Non-ST elevation (NSTEMI) myocardial infarction: Secondary | ICD-10-CM | POA: Diagnosis not present

## 2016-01-25 DIAGNOSIS — Z955 Presence of coronary angioplasty implant and graft: Secondary | ICD-10-CM

## 2016-01-25 NOTE — Telephone Encounter (Signed)
-----   Message from Jerline Pain, MD sent at 01/25/2016  7:05 AM EDT ----- Regarding: RE: cardiac rehab  Yes  Candee Furbish, MD  ----- Message ----- From: Lowell Guitar, RN Sent: 01/20/2016  10:03 AM To: Jerline Pain, MD Subject: cardiac rehab                                  Dear Dr. Marlou Porch,  Pt recently DC to home following admission for negative cardiology workup to evaluate exertional chest discomfort.  Is it ok for her to return to cardiac rehab?  She has scheduled office visit with you 01/26/16.  Thank you, Andi Hence, RN, BSN Cardiac Pulmonary Rehab

## 2016-01-26 ENCOUNTER — Ambulatory Visit (INDEPENDENT_AMBULATORY_CARE_PROVIDER_SITE_OTHER): Payer: Medicare Other | Admitting: Cardiology

## 2016-01-26 ENCOUNTER — Encounter: Payer: Self-pay | Admitting: Cardiology

## 2016-01-26 VITALS — BP 118/70 | HR 68 | Ht 60.0 in | Wt 168.8 lb

## 2016-01-26 DIAGNOSIS — I951 Orthostatic hypotension: Secondary | ICD-10-CM | POA: Diagnosis not present

## 2016-01-26 DIAGNOSIS — I2583 Coronary atherosclerosis due to lipid rich plaque: Secondary | ICD-10-CM

## 2016-01-26 DIAGNOSIS — I252 Old myocardial infarction: Secondary | ICD-10-CM

## 2016-01-26 DIAGNOSIS — E785 Hyperlipidemia, unspecified: Secondary | ICD-10-CM | POA: Diagnosis not present

## 2016-01-26 DIAGNOSIS — I2510 Atherosclerotic heart disease of native coronary artery without angina pectoris: Secondary | ICD-10-CM | POA: Diagnosis not present

## 2016-01-26 NOTE — Progress Notes (Signed)
Cardiology Office Note    Date:  01/26/2016   ID:  Angela Bond, DOB 1946-03-15, MRN [id number]  PCP:  Simona Huh, MD  Cardiologist:   Candee Furbish, MD   No chief complaint on file.   History of Present Illness:  Angela Bond is a 70 y.o. female with a past medical history of CAD s/p NSTEMI in June 2017 with DES to mid LAD, HTN, orthostatic hypotension, HLD, and anxiety who presented to [facility] with chest pain. Her last cath was in June of this year, done in the setting of NSTEMI. She had successful DES placement to her mid LAD. There was residual disease in her proximal RCA (30%) and 35% in his ostial 2nd diagonal. She was placed on DAPT with ASA and Brilinta which was switched to Plavix outpatient for cost reasons. Of note, she was seen in the ED on 11/10/15 for chest pain 4 days after she was discharged from her NSTEMI. A GI cocktail relieved her pain in the ED and she was discharged from the ED. Her lisinopril was discontinued in July of this year as she had some orthostatic hypotension in the office. She reported compliance with medical therapy.   During one cardiac rehab session, she was hypotensive with BP of 88/60 post exercise. She was given a Gatorade and her BP rose to 94/58. She was dizzy and diaphoretic at the time. She called our office and her beta blocker (metoprolol 12.5 mg BID) was discontinued. She was at cardiac rehab yesterday and developed left sided chest pain with radiation to her neck and shoulder with associated SOB, diaphoresis and nausea, somewhat reminiscent of prior angina. EKG during pain showed no acute ST/T changes.  Initial troponin was negative.Vitals were stable with BP 142/98 (mildly hypertensive). CXR NAD. Dr. Burt Knack felt her symptoms were difficult to sort out. Her PCI was just performed about 2 months ago and he felt it was unlikely that she has restenosis or new coronary plaque rupture as she has been compliant with medical therapy. Labs otherwise  notable for Hgb 10.7 (c/w prior). Per notes from our team, the patient had troponin rechecked down in nuc med and it was negative. She underwent inpatient stress test. This showed septal scar/infarction, no findings for ischemia, EF 68%. It was recommended to try her back on a nightly dose of Toprol 12.5mg  daily.    She did not wish to take atorvastatin, Crestor was too expensive. She is now on lovastatin in doing well with this. She was reassured by the nuclear stress test that was low risk with no ischemia. She is now able to walk without any difficulty. She is less short of breath. Overall she is happy. She changed her depression medicine and she is feeling much better.  Past Medical History:  Diagnosis Date  . Anemia    hx  . Anxiety and depression   . Arthritis    "~ all over my body; in the joints"  . Breast cancer, right ([place]) 3/16  . Coronary artery disease    a.  s/p NSTEMI in June 2017 with DES to mid LAD.  Marland Kitchen Depression   . GERD (gastroesophageal reflux disease)   . H/O hiatal hernia   . Heart murmur   . History of blood transfusion    "when taking chemo & w/one of my knee replacements" (11/05/2015)  . Hypercholesteremia   . Hypertension   . Orthostatic hypotension   . Pneumonia X 1  . Shingles 5/15  hx  . Shortness of breath    "stomach is in lungs" when had hernia 2014 none now(07/28/14)  . Sinus headache     Past Surgical History:  Procedure Laterality Date  . BACK SURGERY    . BREAST BIOPSY Right   . BREAST LUMPECTOMY WITH SENTINEL LYMPH NODE BIOPSY Right 09/22/2014   Procedure: RIGHT BREAST LUMPECTOMY WITH NEEDLE LOCALIZATION AND RIGHT AXILLARY SENTINEL LYMPH NODE BIOPSY;  Surgeon: [name], MD;  Location: [facility];  Service: General;  Laterality: Right;  . CARDIAC CATHETERIZATION    . CARDIAC CATHETERIZATION N/A 11/05/2015   Procedure: Left Heart Cath and Coronary Angiography;  Surgeon: Belva Crome, MD;  Location: [place] [facility];  Service: Cardiovascular;   Laterality: N/A;  . CARDIAC CATHETERIZATION N/A 11/05/2015   Procedure: Coronary Stent Intervention;  Surgeon: Belva Crome, MD;  Location: [place];  Service: Cardiovascular;  Laterality: N/A;  . CARPAL TUNNEL RELEASE Right 09/2015  . CORONARY ANGIOPLASTY WITH STENT PLACEMENT  11/05/2015   "1 stent"  . ESOPHAGEAL MANOMETRY N/A 07/02/2012   Procedure: ESOPHAGEAL MANOMETRY (EM);  Surgeon: Lear Ng, MD;  Location: [facility];  Service: Endoscopy;  Laterality: N/A;  schooler to read  . EYE SURGERY Left 2013   tube placed tear duct  . HERNIA REPAIR    . HIATAL HERNIA REPAIR N/A 08/10/2012   Procedure: LAPAROSCOPIC REPAIR OF HIATAL HERNIA WITH MESH AND EGD WITH PEG TUBE PLACEMENT;  Surgeon: Ralene Ok, MD;  Location: [facility];  Service: General;  Laterality: N/A;  . JOINT REPLACEMENT    . PORT-A-CATH REMOVAL  06/2015  . PORTACATH PLACEMENT Left 09/22/2014   Procedure: INSERTION PORT-A-CATH;  Surgeon: [name], MD;  Location: [facility];  Service: General;  Laterality: Left;  . POSTERIOR LUMBAR FUSION  2016   "had 2 vertebrae fused"  . TONSILLECTOMY  as child  . TOTAL KNEE ARTHROPLASTY Bilateral 2011,2012    Current Medications: Outpatient Medications Prior to Visit  Medication Sig Dispense Refill  . aspirin EC 81 MG EC tablet Take 1 tablet (81 mg total) by mouth daily.    . calcium carbonate (OS-CAL) 600 MG TABS Take 600 mg by mouth 2 (two) times daily with a meal.    . carboxymethylcellulose (REFRESH PLUS) 0.5 % SOLN Place 1 drop into both eyes QID.     Marland Kitchen cephALEXin (KEFLEX) 500 MG capsule Take 1 capsule (500 mg total) by mouth 2 (two) times daily. 20 capsule 0  . Cholecalciferol (VITAMIN D) 2000 UNITS tablet Take 2,000 Units by mouth 2 (two) times daily.     . clopidogrel (PLAVIX) 75 MG tablet Take 1 tablet (75 mg total) by mouth daily.    Marland Kitchen dexlansoprazole (DEXILANT) 60 MG capsule Take 60 mg by mouth daily.    . fexofenadine (ALLEGRA) 180 MG tablet Take 180 mg by  mouth daily.    Marland Kitchen gabapentin (NEURONTIN) 300 MG capsule Take 1 capsule by mouth 3  times daily 270 capsule 1  . lovastatin (MEVACOR) 40 MG tablet Take 40 mg by mouth at bedtime.     . meclizine (ANTIVERT) 25 MG tablet Take 12.5-25 mg by mouth 3 (three) times daily as needed for dizziness.    . metoprolol succinate (TOPROL-XL) 25 MG 24 hr tablet Take 0.5 tablets (12.5 mg total) by mouth at bedtime. 30 tablet 3  . nitroGLYCERIN (NITROSTAT) 0.4 MG SL tablet Place 1 tablet (0.4 mg total) under the tongue every 5 (five) minutes as  needed for chest pain (CP or SOB). 25 tablet 2  . sodium chloride (OCEAN) 0.65 % SOLN nasal spray Place 2 sprays into both nostrils as needed for congestion.    Marland Kitchen FLUoxetine (PROZAC) 20 MG capsule Take 20 mg by mouth daily.  11  . LORazepam (ATIVAN) 1 MG tablet Take 1 mg at bedtime and prior to radiation as directed and as needed for anxiety. (Patient taking differently: Take 1 mg by mouth at bedtime. ) 60 tablet 1   No facility-administered medications prior to visit.      Allergies:   Shellfish allergy; Lactose intolerance (gi); Brilinta [ticagrelor]; Nitrofurantoin; Penicillins; Aleve [naproxen]; Aspirin; Ciprofloxacin; Doxycycline; Evista [raloxifene]; Fosamax [alendronate sodium]; Latex; Naproxen sodium; Pravachol [pravastatin]; Sulfa antibiotics; Tape; Welchol [colesevelam hcl]; and Zocor [simvastatin]   Social History   Social History  . Marital status: Married    Spouse name: N/A  . Number of children: 1  . Years of education: N/A   Social History Main Topics  . Smoking status: Never Smoker  . Smokeless tobacco: Never Used  . Alcohol use No  . Drug use: No  . Sexual activity: No   Other Topics Concern  . None   Social History Narrative   Lives with husband.  Does not use assist device.       Family History:  The patient's family history includes Cancer in her father and sister; Diabetes in her father and sister; Heart disease in her father, mother,  and sister.   ROS:   Please see the history of present illness.    ROS All other systems reviewed and are negative.   PHYSICAL EXAM:   VS:  BP 118/70   Pulse 68   Ht 5' (1.524 m)   Wt 168 lb 12.8 oz (76.6 kg)   BMI 32.97 kg/m    GEN: Well nourished, well developed, in no acute distress  HEENT: normal  Neck: no JVD, carotid bruits, or masses Cardiac: RRR; no murmurs, rubs, or gallops,no edema  Respiratory:  clear to auscultation bilaterally, normal work of breathing GI: soft, nontender, nondistended, + BS MS: no deformity or atrophy  Skin: warm and dry, no rash Neuro:  Alert and Oriented x 3, Strength and sensation are intact Psych: euthymic mood, full affect  Wt Readings from Last 3 Encounters:  01/26/16 168 lb 12.8 oz (76.6 kg)  01/15/16 161 lb 9.6 oz (73.3 kg)  12/17/15 163 lb 2.3 oz (74 kg)      Studies/Labs Reviewed:   EKG:  None today  Recent Labs: 11/05/2015: ALT 14 01/16/2016: BUN 9; Creatinine, Ser 0.80; Hemoglobin 10.7; Platelets 187; Potassium 3.8; Sodium 140   Lipid Panel    Component Value Date/Time   CHOL 158 11/06/2015 0519   TRIG 113 11/06/2015 0519   HDL 47 11/06/2015 0519   CHOLHDL 3.4 11/06/2015 0519   VLDL 23 11/06/2015 0519   LDLCALC 88 11/06/2015 0519    Additional studies/ records that were reviewed today include:   Cath 11/05/15: 1. Prox RCA to Mid RCA lesion, 30% stenosed. 2. Mid LAD lesion, 99% stenosed. Post intervention, there is a 5% residual stenosis. 3. Ost 2nd Diag lesion, 35% stenosed.    Acute coronary syndrome caused by acute/subacute thrombotic obstruction of the proximal LAD forming a [place] 111 bifurcation lesion with the large diagonal #1.  Luminal irregularities are noted in the circumflex and right coronary.  Severe anteroapical hypokinesis with estimated ejection fraction 35-40%. LV end-diastolic pressure is normal.  Successful PTCA and  stent of the LAD thrombus containing lesion with reduction in the 95% stenosis  to 0% with TIMI grade 3 flow using a drug-eluting stent postdilated to 3.5 mm in diameter. The diagonal branch though "jailed" ended with less than 40% ostial obstruction.  RECOMMENDATIONS: 1. Probable discharge in a.m. assuming no intercurrent complications. 2. Low dose Ace and beta blocker therapy were started due to LV dysfunction in the setting of acute coronary syndrome. Suspect the left ventricular function will significantly improved and of the wall motion abnormality is related to "stunned" myocardium. 3. High intensity statin therapy was ordered although the patient has many potential allergies/intolerances.   NUC stress 01/16/16: 1. Septal scar/infarction.  No findings for ischemia.  2. Septal wall motion abnormality and bulging of the inferoseptal wall with contraction.  3. Left ventricular ejection fraction 68%  4. Non invasive risk stratification*: Intermediate to low risk.    ASSESSMENT:    1. History of MI (myocardial infarction)   2. Coronary artery disease due to lipid rich plaque   3. Hyperlipidemia   4. Orthostatic hypotension      PLAN:  In order of problems listed above:  Coronary artery disease  - Stable with no active anginal symptoms. Prior DES to mid LAD  History of MI  - June 2017-non-ST elevation myocardial infarction. DES to mid LAD.  Orthostatic hypotension  - Currently very stable. We have to be careful with her blood pressure medications. She has been hypotensive at times post exercise.  Hyperlipidemia  - Continue with lovastatin. She does not wish to take atorvastatin. Crestor to expensive.  We will see her back in 4 months, Truitt Merle, me in 8 months. She may resume cardiac rehabilitation. She has a gym membership across the street from her house. This would be excellent as well.   Medication Adjustments/Labs and Tests Ordered: Current medicines are reviewed at length with the patient today.  Concerns regarding medicines are  outlined above.  Medication changes, Labs and Tests ordered today are listed in the Patient Instructions below. Patient Instructions  Medication Instructions:  The current medical regimen is effective;  continue present plan and medications.  Follow-Up: Follow up in 4 months with Truitt Merle.  Follow up in 8 months with Dr. Marlou Porch.  You will receive a letter in the mail 2 months before you are due.  Please call us when you receive this letter to schedule your follow up appointment.  If you need a refill on your cardiac medications before your next appointment, please call your pharmacy.  Thank you for choosing [facility]!!        Signed, Candee Furbish, MD  01/26/2016 12:42 PM    [address], [place], [place]  [address] Phone: [phone]; Fax: [phone]

## 2016-01-26 NOTE — Patient Instructions (Signed)
Medication Instructions:  The current medical regimen is effective;  continue present plan and medications.  Follow-Up: Follow up in 4 months with Truitt Merle.  Follow up in 8 months with Dr. Marlou Porch.  You will receive a letter in the mail 2 months before you are due.  Please call us when you receive this letter to schedule your follow up appointment.  If you need a refill on your cardiac medications before your next appointment, please call your pharmacy.  Thank you for choosing [facility]!!

## 2016-01-27 ENCOUNTER — Encounter (HOSPITAL_COMMUNITY)
Admission: RE | Admit: 2016-01-27 | Discharge: 2016-01-27 | Disposition: A | Discharge status: Home / Self Care | Payer: Medicare Other | Source: Ambulatory Visit | Attending: Cardiology | Admitting: Cardiology

## 2016-01-27 DIAGNOSIS — I214 Non-ST elevation (NSTEMI) myocardial infarction: Secondary | ICD-10-CM

## 2016-01-27 DIAGNOSIS — Z955 Presence of coronary angioplasty implant and graft: Secondary | ICD-10-CM

## 2016-01-29 ENCOUNTER — Encounter (HOSPITAL_COMMUNITY)
Admission: RE | Admit: 2016-01-29 | Discharge: 2016-01-29 | Disposition: A | Discharge status: Home / Self Care | Payer: Medicare Other | Source: Ambulatory Visit | Attending: Cardiology | Admitting: Cardiology

## 2016-01-29 DIAGNOSIS — I214 Non-ST elevation (NSTEMI) myocardial infarction: Secondary | ICD-10-CM

## 2016-01-29 DIAGNOSIS — Z955 Presence of coronary angioplasty implant and graft: Secondary | ICD-10-CM

## 2016-01-29 NOTE — Progress Notes (Signed)
Cardiac Individual Treatment Plan  Patient Details  Name: Angela Bond MRN: [id number] Date of Birth: 27-Jul-1945 Referring Provider:   Flowsheet Row CARDIAC REHAB PHASE II ORIENTATION from 12/17/2015 in [place]  Referring Provider  Candee Furbish, MD      Initial Encounter Date:  [place] PHASE II ORIENTATION from 12/17/2015 in [place]  Date  12/17/15  Referring Provider  Candee Furbish, MD      Visit Diagnosis: 11/05/15 NSTEMI (non-ST elevated myocardial infarction) ([place])  11/05/15 Stented coronary artery  Patient's Home Medications on Admission:  Current Outpatient Prescriptions:  .  aspirin EC 81 MG EC tablet, Take 1 tablet (81 mg total) by mouth daily., Disp: , Rfl:  .  calcium carbonate (OS-CAL) 600 MG TABS, Take 600 mg by mouth 2 (two) times daily with a meal., Disp: , Rfl:  .  carboxymethylcellulose (REFRESH PLUS) 0.5 % SOLN, Place 1 drop into both eyes QID. , Disp: , Rfl:  .  cephALEXin (KEFLEX) 250 MG capsule, Take 250 mg by mouth at bedtime., Disp: , Rfl:  .  cephALEXin (KEFLEX) 500 MG capsule, Take 1 capsule (500 mg total) by mouth 2 (two) times daily., Disp: 20 capsule, Rfl: 0 .  Cholecalciferol (VITAMIN D) 2000 UNITS tablet, Take 2,000 Units by mouth 2 (two) times daily. , Disp: , Rfl:  .  clopidogrel (PLAVIX) 75 MG tablet, Take 1 tablet (75 mg total) by mouth daily., Disp: , Rfl:  .  dexlansoprazole (DEXILANT) 60 MG capsule, Take 60 mg by mouth daily., Disp: , Rfl:  .  escitalopram (LEXAPRO) 20 MG tablet, Take 20 mg by mouth daily., Disp: , Rfl:  .  fexofenadine (ALLEGRA) 180 MG tablet, Take 180 mg by mouth daily., Disp: , Rfl:  .  gabapentin (NEURONTIN) 300 MG capsule, Take 1 capsule by mouth 3  times daily, Disp: 270 capsule, Rfl: 1 .  LORazepam (ATIVAN) 1 MG tablet, Take 1 mg by mouth at bedtime. For sleeping per patient, Disp: , Rfl:  .  lovastatin (MEVACOR) 40 MG tablet, Take 40 mg  by mouth at bedtime. , Disp: , Rfl:  .  meclizine (ANTIVERT) 25 MG tablet, Take 12.5-25 mg by mouth 3 (three) times daily as needed for dizziness., Disp: , Rfl:  .  metoprolol succinate (TOPROL-XL) 25 MG 24 hr tablet, Take 0.5 tablets (12.5 mg total) by mouth at bedtime., Disp: 30 tablet, Rfl: 3 .  nitroGLYCERIN (NITROSTAT) 0.4 MG SL tablet, Place 1 tablet (0.4 mg total) under the tongue every 5 (five) minutes as needed for chest pain (CP or SOB)., Disp: 25 tablet, Rfl: 2 .  sodium chloride (OCEAN) 0.65 % SOLN nasal spray, Place 2 sprays into both nostrils as needed for congestion., Disp: , Rfl:   Past Medical History: Past Medical History:  Diagnosis Date  . Anemia    hx  . Anxiety and depression   . Arthritis    "~ all over my body; in the joints"  . Breast cancer, right ([place]) 3/16  . Coronary artery disease    a.  s/p NSTEMI in June 2017 with DES to mid LAD.  [place] Depression   . GERD (gastroesophageal reflux disease)   . H/O hiatal hernia   . Heart murmur   . History of blood transfusion    "when taking chemo & w/one of my knee replacements" (11/05/2015)  . Hypercholesteremia   . Hypertension   . Orthostatic hypotension   .  Pneumonia X 1  . Shingles 5/15   hx  . Shortness of breath    "stomach is in lungs" when had hernia 2014 none now(07/28/14)  . Sinus headache     Tobacco Use: History  Smoking Status  . Never Smoker  Smokeless Tobacco  . Never Used    Labs: Recent Review Flowsheet Data    Labs for ITP Cardiac and Pulmonary Rehab Latest Ref Rng & Units 11/04/2006 09/20/2013 09/21/2013 11/05/2015 11/06/2015   Cholestrol 0 - 200 mg/dL - - 166 - 158   LDLCALC 0 - 99 mg/dL - - 90 - 88   HDL >40 mg/dL - - 55 - 47   Trlycerides <150 mg/dL - - 107 - 113   Hemoglobin A1c 4.8 - 5.6 % - 5.6 - 5.5 -   HCO3 - 30.1(H) - - - -   TCO2 0 - 100 mmol/L 31 28 - - -      Capillary Blood Glucose: Lab Results  Component Value Date   GLUCAP 176 (H) 09/20/2013   GLUCAP 99 09/20/2013      Exercise Target Goals:    Exercise Program Goal: Individual exercise prescription set with THRR, safety & activity barriers. Participant demonstrates ability to understand and report RPE using BORG scale, to self-measure pulse accurately, and to acknowledge the importance of the exercise prescription.  Exercise Prescription Goal: Starting with aerobic activity 30 plus minutes a day, 3 days per week for initial exercise prescription. Provide home exercise prescription and guidelines that participant acknowledges understanding prior to discharge.  Activity Barriers & Risk Stratification:     Activity Barriers & Cardiac Risk Stratification - 12/17/15 0823      Activity Barriers & Cardiac Risk Stratification   Activity Barriers History of Falls;Back Problems;Other (comment);Shortness of Breath;Assistive Device;Joint Problems   Comments 2 fusion in lower back, broken R thumb and neck pain   Cardiac Risk Stratification High      6 Minute Walk:     6 Minute Walk    Row Name 12/17/15 1553         6 Minute Walk   Phase Initial     Distance 1208 feet     Walk Time 6 minutes     # of Rest Breaks 0     MPH 2.28     METS 2.2     RPE 11     VO2 Peak 7.7     Symptoms No     Resting HR 60 bpm     Resting BP 117/84     Max Ex. HR 92 bpm     Max Ex. BP 129/85     2 Minute Post BP 118/80        Initial Exercise Prescription:     Initial Exercise Prescription - 12/17/15 1500      Date of Initial Exercise RX and Referring Provider   Date 12/17/15   Referring Provider Candee Furbish, MD     Treadmill   MPH 1.7   Grade 0   Minutes 10   METs 2.3     NuStep   Level 2   Minutes 10   METs 1.5     Arm Ergometer   Level 1   Minutes 10   METs 1     Prescription Details   Frequency (times per week) 3   Duration Progress to 30 minutes of continuous aerobic without signs/symptoms of physical distress     Intensity   THRR 40-80% of  Max Heartrate 60-119   Ratings of  Perceived Exertion 11-13   Perceived Dyspnea 0-4     Progression   Progression Continue to progress workloads to maintain intensity without signs/symptoms of physical distress.     Resistance Training   Training Prescription Yes   Weight 1lb   Reps 10-12      Perform Capillary Blood Glucose checks as needed.  Exercise Prescription Changes:     Exercise Prescription Changes    Row Name 01/07/16 1300 01/25/16 1700           Exercise Review   Progression Yes Yes        Response to Exercise   Blood Pressure (Admit) 110/72 112/82      Blood Pressure (Exercise) 124/80 132/70      Blood Pressure (Exit) 120/78 104/60      Heart Rate (Admit) 67 bpm 77 bpm      Heart Rate (Exercise) 103 bpm 103 bpm      Heart Rate (Exit) 67 bpm 72 bpm      Rating of Perceived Exertion (Exercise) 13 12      Comments  - Reviewed HEP on 01/01/16      Duration Progress to 30 minutes of continuous aerobic without signs/symptoms of physical distress Progress to 30 minutes of continuous aerobic without signs/symptoms of physical distress      Intensity THRR unchanged THRR unchanged        Progression   Progression Continue to progress workloads to maintain intensity without signs/symptoms of physical distress. Continue to progress workloads to maintain intensity without signs/symptoms of physical distress.      Average METs 2.2 3.1        Resistance Training   Training Prescription Yes Yes      Weight 1lb 2lbs      Reps 10-12 10-12        NuStep   Level 2 4      Minutes 10 10      METs 1.6 3.6        Arm Ergometer   Level 1 1      Minutes 10 10      METs 1 1        Track   Laps 9 10      Minutes 10 10      METs 2.39 2.74        Home Exercise Plan   Plans to continue exercise at Home  Reviewed on 01/01/16 see progress note Home  Reviewed on 01/01/16 see progress note      Frequency Add 2 additional days to program exercise sessions. Add 2 additional days to program exercise sessions.          Exercise Comments:     Exercise Comments    Row Name 12/25/15 1119 01/07/16 1305 01/25/16 1712       Exercise Comments There are no changes to current Ex Rx Reviewed MET's. Pt is tolerating exercise well; will continue to monitor exercise progression. Reviewed MET's and goals. Pt is tolerating exercise well; will continue to monitor exercise progression.        Discharge Exercise Prescription (Final Exercise Prescription Changes):     Exercise Prescription Changes - 01/25/16 1700      Exercise Review   Progression Yes     Response to Exercise   Blood Pressure (Admit) 112/82   Blood Pressure (Exercise) 132/70   Blood Pressure (Exit) 104/60   Heart Rate (Admit) 77 bpm  Heart Rate (Exercise) 103 bpm   Heart Rate (Exit) 72 bpm   Rating of Perceived Exertion (Exercise) 12   Comments Reviewed HEP on 01/01/16   Duration Progress to 30 minutes of continuous aerobic without signs/symptoms of physical distress   Intensity THRR unchanged     Progression   Progression Continue to progress workloads to maintain intensity without signs/symptoms of physical distress.   Average METs 3.1     Resistance Training   Training Prescription Yes   Weight 2lbs   Reps 10-12     NuStep   Level 4   Minutes 10   METs 3.6     Arm Ergometer   Level 1   Minutes 10   METs 1     Track   Laps 10   Minutes 10   METs 2.74     Home Exercise Plan   Plans to continue exercise at Home  Reviewed on 01/01/16 see progress note   Frequency Add 2 additional days to program exercise sessions.      Nutrition:  Target Goals: Understanding of nutrition guidelines, daily intake of sodium <1518m, cholesterol <2087m calories 30% from fat and 7% or less from saturated fats, daily to have 5 or more servings of fruits and vegetables.  Biometrics:     Pre Biometrics - 12/17/15 1551      Pre Biometrics   Waist Circumference 36 inches   Hip Circumference 43.5 inches   Waist to Hip Ratio 0.83  %   Triceps Skinfold 44 mm   % Body Fat 45.6 %   Grip Strength 21 kg   Flexibility 11.5 in   Single Leg Stand 3.75 seconds       Nutrition Therapy Plan and Nutrition Goals:     Nutrition Therapy & Goals - 12/18/15 1347      Nutrition Therapy   Diet Therapeutic Lifestyle Changes     Personal Nutrition Goals   Personal Goal #1 1-2 lb wt loss/week to a goal wt loss of 6-24 lb [facility] [place] [place] [facility] [place] regarding individualized specific dietary modifications aiming towards targeted core components such as weight, hypertension, lipid management, diabetes, heart failure and other comorbidities.   Expected Outcomes Short Term Goal: Understand basic principles of dietary content, such as calories, fat, sodium, cholesterol and nutrients.;Long Term Goal: Adherence to prescribed nutrition plan.      Nutrition Discharge: Nutrition Scores:     Nutrition Assessments - 01/01/16 1437      MEDFICTS Scores   Pre Score 24      Nutrition Goals Re-Evaluation:   Psychosocial: Target Goals: Acknowledge presence or absence of depression, maximize coping skills, provide positive support system. Participant is able to verbalize types and ability to use techniques and skills needed for reducing stress and depression.  Initial Review & Psychosocial Screening:     Initial Psych Review & Screening - 12/21/15 1637      Initial Review   Current issues with History of Depression     Family Dynamics   Good Support System? Yes   Comments strong faith base      Barriers   Psychosocial barriers to participate in program The patient should benefit from training in stress management and relaxation.     Screening Interventions   Interventions Encouraged to exercise      Quality of Life Scores:     Quality of Life - 12/25/15 1446  Quality of Life Scores   Health/Function Pre 21.86 %   Socioeconomic Pre 25.71 %    Psych/Spiritual Pre 27.43 %   Family Pre 27.6 %   GLOBAL Pre 24.73 %      PHQ-9: Recent Review Flowsheet Data    Depression screen [facility] 2/9 12/21/2015 04/15/2015 02/03/2015 01/07/2015   Decreased Interest 0 0 0 1   Down, Depressed, Hopeless 1 0 0 1   PHQ - 2 Score 1 0 0 2   Altered sleeping - 0 - 1   Tired, decreased energy - 0 - 1   Change in appetite - 0 - 1   Feeling bad or failure about yourself  - 0 - 0   Trouble concentrating - 0 - 0   Moving slowly or fidgety/restless - 0 - 0   Suicidal thoughts - 0  - 0   PHQ-9 Score - 0 - 5   Difficult doing work/chores - Not difficult at all - Not difficult at all      Psychosocial Evaluation and Intervention:     Psychosocial Evaluation - 01/29/16 1624      Psychosocial Evaluation & Interventions   Interventions Stress management education;Relaxation education;Encouraged to exercise with the program and follow exercise prescription   Comments pt does have history of depression, which is better managed with recent medication adjustment per PCP.  pt mood is more light and upbeat.  pt admits she feels much better and is very grateful for the improvement.     Continued Psychosocial Services Needed Yes      Psychosocial Re-Evaluation:   Vocational Rehabilitation: Provide vocational rehab assistance to qualifying candidates.   Vocational Rehab Evaluation & Intervention:     Vocational Rehab - 12/17/15 1802      Initial Vocational Rehab Evaluation & Intervention   Assessment shows need for Vocational Rehabilitation No  Pt is retired and does not plan to return to [employer].      Education: Education Goals: Education classes will be provided on a weekly basis, covering required topics. Participant will state understanding/return demonstration of topics presented.  Learning Barriers/Preferences:     Learning Barriers/Preferences - 12/17/15 0825      Learning Barriers/Preferences   Learning Barriers Sight    Learning Preferences Skilled Demonstration;Written Material;Pictoral;Video      Education Topics: Count Your Pulse:  -Group instruction provided by verbal instruction, demonstration, patient participation and written materials to support subject.  Instructors address importance of being able to find your pulse and how to count your pulse when at home without a heart monitor.  Patients get hands on experience counting their pulse with staff help and individually.   Heart Attack, Angina, and Risk Factor Modification:  -Group instruction provided by verbal instruction, video, and written materials to support subject.  Instructors address signs and symptoms of angina and heart attacks.    Also discuss risk factors for heart disease and how to make changes to improve heart health risk factors. Flowsheet Row CARDIAC REHAB PHASE II EXERCISE from 01/29/2016 in [place]  Date  01/06/16  Educator  RN  Instruction Review Code  2- meets goals/outcomes      Functional Fitness:  -Group instruction provided by verbal instruction, demonstration, patient participation, and written materials to support subject.  Instructors address safety measures for doing things around the house.  Discuss how to get up and down off the floor, how to pick things up properly, how to safely get out of  a chair without assistance, and balance training. Flowsheet Row CARDIAC REHAB PHASE II EXERCISE from 01/29/2016 in [place]  Date  01/29/16  Instruction Review Code  2- meets goals/outcomes      Meditation and Mindfulness:  -Group instruction provided by verbal instruction, patient participation, and written materials to support subject.  Instructor addresses importance of mindfulness and meditation practice to help reduce stress and improve awareness.  Instructor also leads participants through a meditation exercise.    Stretching for Flexibility and  Mobility:  -Group instruction provided by verbal instruction, patient participation, and written materials to support subject.  Instructors lead participants through series of stretches that are designed to increase flexibility thus improving mobility.  These stretches are additional exercise for major muscle groups that are typically performed during regular warm up and cool down.   Hands Only CPR Anytime:  -Group instruction provided by verbal instruction, video, patient participation and written materials to support subject.  Instructors co-teach with AHA video for hands only CPR.  Participants get hands on experience with mannequins.   Nutrition I class: Heart Healthy Eating:  -Group instruction provided by PowerPoint slides, verbal discussion, and written materials to support subject matter. The instructor gives an explanation and review of the Therapeutic Lifestyle Changes diet recommendations, which includes a discussion on lipid goals, dietary fat, sodium, fiber, plant stanol/sterol esters, sugar, and the components of a well-balanced, healthy diet. Flowsheet Row CARDIAC REHAB PHASE II EXERCISE from 01/29/2016 in [place]  Date  01/01/16  Educator  RD  Instruction Review Code  Not applicable [class handouts given]      Nutrition II class: Lifestyle Skills:  -Group instruction provided by PowerPoint slides, verbal discussion, and written materials to support subject matter. The instructor gives an explanation and review of label reading, grocery shopping for heart health, heart healthy recipe modifications, and ways to make healthier choices when eating out. Flowsheet Row CARDIAC REHAB PHASE II EXERCISE from 01/29/2016 in [place]  Date  01/01/16  Educator  RD  Instruction Review Code  Not applicable [class handouts given]      Diabetes Question & Answer:  -Group instruction provided by PowerPoint slides, verbal  discussion, and written materials to support subject matter. The instructor gives an explanation and review of diabetes co-morbidities, pre- and post-prandial blood glucose goals, pre-exercise blood glucose goals, signs, symptoms, and treatment of hypoglycemia and hyperglycemia, and foot care basics. Flowsheet Row CARDIAC REHAB PHASE II EXERCISE from 01/29/2016 in [place]  Date  12/25/15  Educator  RD  Instruction Review Code  2- meets goals/outcomes      Diabetes Blitz:  -Group instruction provided by PowerPoint slides, verbal discussion, and written materials to support subject matter. The instructor gives an explanation and review of the physiology behind type 1 and type 2 diabetes, diabetes medications and rational behind using different medications, pre- and post-prandial blood glucose recommendations and Hemoglobin A1c goals, diabetes diet, and exercise including blood glucose guidelines for exercising safely.    Portion Distortion:  -Group instruction provided by PowerPoint slides, verbal discussion, written materials, and food models to support subject matter. The instructor gives an explanation of serving size versus portion size, changes in portions sizes over the last 20 years, and what consists of a serving from each food group. Flowsheet Row CARDIAC REHAB PHASE II EXERCISE from 01/29/2016 in [place]  Date  12/23/15  Educator  RD  Instruction Review Code  2- meets goals/outcomes      Stress Management:  -Group instruction provided by verbal instruction, video, and written materials to support subject matter.  Instructors review role of stress in heart disease and how to cope with stress positively.     Exercising on Your Own:  -Group instruction provided by verbal instruction, power point, and written materials to support subject.  Instructors discuss benefits of exercise, components of exercise, frequency and  intensity of exercise, and end points for exercise.  Also discuss use of nitroglycerin and activating EMS.  Review options of places to exercise outside of rehab.  Review guidelines for sex with heart disease.   Cardiac Drugs I:  -Group instruction provided by verbal instruction and written materials to support subject.  Instructor reviews cardiac drug classes: antiplatelets, anticoagulants, beta blockers, and statins.  Instructor discusses reasons, side effects, and lifestyle considerations for each drug class. Flowsheet Row CARDIAC REHAB PHASE II EXERCISE from 01/29/2016 in [place]  Date  12/30/15  Educator  pharmD  Instruction Review Code  2- meets goals/outcomes      Cardiac Drugs II:  -Group instruction provided by verbal instruction and written materials to support subject.  Instructor reviews cardiac drug classes: angiotensin converting enzyme inhibitors (ACE-I), angiotensin II receptor blockers (ARBs), nitrates, and calcium channel blockers.  Instructor discusses reasons, side effects, and lifestyle considerations for each drug class. Flowsheet Row CARDIAC REHAB PHASE II EXERCISE from 01/29/2016 in [place]  Date  01/27/16  Educator  Pharm D  Instruction Review Code  2- meets goals/outcomes      Anatomy and Physiology of the Circulatory System:  -Group instruction provided by verbal instruction, video, and written materials to support subject.  Reviews functional anatomy of heart, how it relates to various diagnoses, and what role the heart plays in the overall system. [place] PHASE II EXERCISE from 01/29/2016 in [place]  Date  01/20/16  Instruction Review Code  2- meets goals/outcomes      Knowledge Questionnaire Score:     Knowledge Questionnaire Score - 12/17/15 1536      Knowledge Questionnaire Score   Pre Score 21/24      Core Components/Risk  Factors/Patient Goals at Admission:     Personal Goals and Risk Factors at Admission - 12/17/15 0828      Core Components/Risk Factors/Patient Goals on Admission    Weight Management Yes;Obesity   Intervention Weight Management: Develop a combined nutrition and exercise program designed to reach desired caloric intake, while maintaining appropriate intake of nutrient and fiber, sodium and fats, and appropriate energy expenditure required for the weight goal.;Weight Management: Provide education and appropriate resources to help participant work on and attain dietary goals.;Weight Management/Obesity: Establish reasonable short term and long term weight goals.;Obesity: Provide education and appropriate resources to help participant work on and attain dietary goals.   Expected Outcomes Short Term: Continue to assess and modify interventions until short term weight is achieved;Weight Maintenance: Understanding of the daily nutrition guidelines, which includes 25-35% calories from fat, 7% or less cal from saturated fats, less than 285m cholesterol, less than 1.5gm of sodium, & 5 or more servings of fruits and vegetables daily;Weight Loss: Understanding of general recommendations for a balanced deficit meal plan, which promotes 1-2 lb weight loss per week and includes a negative energy balance of [phone] kcal/d;Understanding recommendations for meals to include 15-35%  energy as protein, 25-35% energy from fat, 35-60% energy from carbohydrates, less than 233m of dietary cholesterol, 20-35 gm of total fiber daily;Understanding of distribution of calorie intake throughout the day with the consumption of 4-5 meals/snacks   Increase Strength and Stamina Yes   Intervention Provide advice, education, support and counseling about physical activity/exercise needs.;Develop an individualized exercise prescription for aerobic and resistive training based on initial evaluation findings, risk stratification, comorbidities  and participant's personal goals.   Expected Outcomes Achievement of increased cardiorespiratory fitness and enhanced flexibility, muscular endurance and strength shown through measurements of functional capacity and personal statement of participant.   Improve shortness of breath with ADL's Yes   Intervention Provide education, individualized exercise plan and daily activity instruction to help decrease symptoms of SOB with activities of daily living.   Expected Outcomes Short Term: Achieves a reduction of symptoms when performing activities of daily living.   Hypertension Yes   Intervention Provide education on lifestyle modifcations including regular physical activity/exercise, weight management, moderate sodium restriction and increased consumption of fresh fruit, vegetables, and low fat dairy, alcohol moderation, and smoking cessation.;Monitor prescription use compliance.   Expected Outcomes Short Term: Continued assessment and intervention until BP is < 140/959mHG in hypertensive participants. < 130/8037mG in hypertensive participants with diabetes, heart failure or chronic kidney disease.;Long Term: Maintenance of blood pressure at goal levels.   Lipids Yes   Intervention Provide education and support for participant on nutrition & aerobic/resistive exercise along with prescribed medications to achieve LDL <51m2mDL >40mg86mExpected Outcomes Short Term: Participant states understanding of desired cholesterol values and is compliant with medications prescribed. Participant is following exercise prescription and nutrition guidelines.;Long Term: Cholesterol controlled with medications as prescribed, with individualized exercise RX and with personalized nutrition plan. Value goals: LDL < 51mg,53m > 40 mg.   Personal Goal Other Yes   Personal Goal short: Be able to do more activities with less fatigue  Long: Be able to climb stairs with less SOB and lose 20lbs   Intervention Provide nutrition and  education counseling to assist with increaseing exercise capacity, losing weight and increasing energy levels   Expected Outcomes Pt will be able to lose wt, do more activities and be able to climb stairs without difficulties      Core Components/Risk Factors/Patient Goals Review:      Goals and Risk Factor Review    Row Name 01/25/16 1712             Core Components/Risk Factors/Patient Goals Review   Personal Goals Review Other       Review Pt is doing well with ADL's and steady improving exercise tolerance       Expected Outcomes Pt will continue to improve in cardiovascular fitness and be able to do household chores without difficulty.          Core Components/Risk Factors/Patient Goals at Discharge (Final Review):      Goals and Risk Factor Review - 01/25/16 1712      Core Components/Risk Factors/Patient Goals Review   Personal Goals Review Other   Review Pt is doing well with ADL's and steady improving exercise tolerance   Expected Outcomes Pt will continue to improve in cardiovascular fitness and be able to do household chores without difficulty.      ITP Comments:     ITP Comments    Row Name 12/17/15 0819  [phone]     ITP Comments Dr. TraciTressia Miners  Turner, Medical Director          Comments: Pt is making expected progress toward personal goals after completing 14 sessions. Recommend continued exercise and life style modification education including  stress management and relaxation techniques to decrease cardiac risk profile.

## 2016-02-01 ENCOUNTER — Encounter (HOSPITAL_COMMUNITY)
Admission: RE | Admit: 2016-02-01 | Discharge: 2016-02-01 | Disposition: A | Discharge status: Home / Self Care | Payer: Medicare Other | Source: Ambulatory Visit | Attending: Cardiology | Admitting: Cardiology

## 2016-02-01 ENCOUNTER — Other Ambulatory Visit: Payer: Self-pay | Admitting: *Deleted

## 2016-02-01 DIAGNOSIS — Z955 Presence of coronary angioplasty implant and graft: Secondary | ICD-10-CM

## 2016-02-01 DIAGNOSIS — I214 Non-ST elevation (NSTEMI) myocardial infarction: Secondary | ICD-10-CM | POA: Diagnosis not present

## 2016-02-01 MED ORDER — CLOPIDOGREL BISULFATE 75 MG PO TABS: 75.0000 mg | ORAL_TABLET | Freq: Every day | ORAL | 3 refills | Status: DC

## 2016-02-03 ENCOUNTER — Other Ambulatory Visit: Payer: Self-pay

## 2016-02-03 ENCOUNTER — Encounter (HOSPITAL_COMMUNITY)
Admission: RE | Admit: 2016-02-03 | Discharge: 2016-02-03 | Disposition: A | Discharge status: Home / Self Care | Payer: Medicare Other | Source: Ambulatory Visit | Attending: Cardiology | Admitting: Cardiology

## 2016-02-03 DIAGNOSIS — I214 Non-ST elevation (NSTEMI) myocardial infarction: Secondary | ICD-10-CM

## 2016-02-03 DIAGNOSIS — Z955 Presence of coronary angioplasty implant and graft: Secondary | ICD-10-CM

## 2016-02-03 MED ORDER — CLOPIDOGREL BISULFATE 75 MG PO TABS: 75.0000 mg | ORAL_TABLET | Freq: Every day | ORAL | 3 refills | Status: DC

## 2016-02-05 ENCOUNTER — Encounter (HOSPITAL_COMMUNITY)
Admission: RE | Admit: 2016-02-05 | Discharge: 2016-02-05 | Disposition: A | Discharge status: Home / Self Care | Payer: Medicare Other | Source: Ambulatory Visit | Attending: Cardiology | Admitting: Cardiology

## 2016-02-05 DIAGNOSIS — I214 Non-ST elevation (NSTEMI) myocardial infarction: Secondary | ICD-10-CM | POA: Diagnosis not present

## 2016-02-05 DIAGNOSIS — Z955 Presence of coronary angioplasty implant and graft: Secondary | ICD-10-CM

## 2016-02-08 ENCOUNTER — Encounter (HOSPITAL_COMMUNITY)
Admission: RE | Admit: 2016-02-08 | Discharge: 2016-02-08 | Disposition: A | Discharge status: Home / Self Care | Payer: Medicare Other | Source: Ambulatory Visit | Attending: Cardiology | Admitting: Cardiology

## 2016-02-08 DIAGNOSIS — I214 Non-ST elevation (NSTEMI) myocardial infarction: Secondary | ICD-10-CM | POA: Diagnosis not present

## 2016-02-08 DIAGNOSIS — Z955 Presence of coronary angioplasty implant and graft: Secondary | ICD-10-CM

## 2016-02-10 ENCOUNTER — Encounter (HOSPITAL_COMMUNITY): Admission: RE | Admit: 2016-02-10 | Payer: Medicare Other | Source: Ambulatory Visit

## 2016-02-12 ENCOUNTER — Encounter (HOSPITAL_COMMUNITY)
Admission: RE | Admit: 2016-02-12 | Discharge: 2016-02-12 | Disposition: A | Discharge status: Home / Self Care | Payer: Medicare Other | Source: Ambulatory Visit | Attending: Cardiology | Admitting: Cardiology

## 2016-02-12 DIAGNOSIS — I214 Non-ST elevation (NSTEMI) myocardial infarction: Secondary | ICD-10-CM

## 2016-02-12 DIAGNOSIS — Z955 Presence of coronary angioplasty implant and graft: Secondary | ICD-10-CM

## 2016-02-15 ENCOUNTER — Encounter (HOSPITAL_COMMUNITY)
Admission: RE | Admit: 2016-02-15 | Discharge: 2016-02-15 | Disposition: A | Discharge status: Home / Self Care | Payer: Medicare Other | Source: Ambulatory Visit | Attending: Cardiology | Admitting: Cardiology

## 2016-02-15 DIAGNOSIS — I214 Non-ST elevation (NSTEMI) myocardial infarction: Secondary | ICD-10-CM | POA: Diagnosis not present

## 2016-02-15 DIAGNOSIS — Z955 Presence of coronary angioplasty implant and graft: Secondary | ICD-10-CM | POA: Diagnosis present

## 2016-02-17 ENCOUNTER — Encounter (HOSPITAL_COMMUNITY): Payer: Medicare Other

## 2016-02-19 ENCOUNTER — Encounter (HOSPITAL_COMMUNITY)
Admission: RE | Admit: 2016-02-19 | Discharge: 2016-02-19 | Disposition: A | Discharge status: Home / Self Care | Payer: Medicare Other | Source: Ambulatory Visit | Attending: Cardiology | Admitting: Cardiology

## 2016-02-19 DIAGNOSIS — I214 Non-ST elevation (NSTEMI) myocardial infarction: Secondary | ICD-10-CM | POA: Diagnosis not present

## 2016-02-19 DIAGNOSIS — Z955 Presence of coronary angioplasty implant and graft: Secondary | ICD-10-CM

## 2016-02-22 ENCOUNTER — Encounter (HOSPITAL_COMMUNITY)
Admission: RE | Admit: 2016-02-22 | Discharge: 2016-02-22 | Disposition: A | Discharge status: Home / Self Care | Payer: Medicare Other | Source: Ambulatory Visit | Attending: Cardiology | Admitting: Cardiology

## 2016-02-22 DIAGNOSIS — I214 Non-ST elevation (NSTEMI) myocardial infarction: Secondary | ICD-10-CM

## 2016-02-22 DIAGNOSIS — Z955 Presence of coronary angioplasty implant and graft: Secondary | ICD-10-CM

## 2016-02-24 ENCOUNTER — Encounter (HOSPITAL_COMMUNITY)
Admission: RE | Admit: 2016-02-24 | Discharge: 2016-02-24 | Disposition: A | Discharge status: Home / Self Care | Payer: Medicare Other | Source: Ambulatory Visit | Attending: Cardiology | Admitting: Cardiology

## 2016-02-24 DIAGNOSIS — I214 Non-ST elevation (NSTEMI) myocardial infarction: Secondary | ICD-10-CM

## 2016-02-24 DIAGNOSIS — Z955 Presence of coronary angioplasty implant and graft: Secondary | ICD-10-CM

## 2016-02-24 NOTE — Progress Notes (Signed)
Cardiac Individual Treatment Plan  Patient Details  Name: Angela Bond MRN: [id number] Date of Birth: 28-Oct-1945 Referring Provider:   Flowsheet Row CARDIAC REHAB PHASE II ORIENTATION from 12/17/2015 in [place]  Referring Provider  Candee Furbish, MD      Initial Encounter Date:  [place] PHASE II ORIENTATION from 12/17/2015 in [place]  Date  12/17/15  Referring Provider  Candee Furbish, MD      Visit Diagnosis: 11/05/15 Stented coronary artery  11/05/15 NSTEMI (non-ST elevated myocardial infarction) [facility])  Patient's Home Medications on Admission:  Current Outpatient Prescriptions:  .  aspirin EC 81 MG EC tablet, Take 1 tablet (81 mg total) by mouth daily., Disp: , Rfl:  .  calcium carbonate (OS-CAL) 600 MG TABS, Take 600 mg by mouth 2 (two) times daily with a meal., Disp: , Rfl:  .  carboxymethylcellulose (REFRESH PLUS) 0.5 % SOLN, Place 1 drop into both eyes QID. , Disp: , Rfl:  .  cephALEXin (KEFLEX) 250 MG capsule, Take 250 mg by mouth at bedtime., Disp: , Rfl:  .  cephALEXin (KEFLEX) 500 MG capsule, Take 1 capsule (500 mg total) by mouth 2 (two) times daily., Disp: 20 capsule, Rfl: 0 .  Cholecalciferol (VITAMIN D) 2000 UNITS tablet, Take 2,000 Units by mouth 2 (two) times daily. , Disp: , Rfl:  .  clopidogrel (PLAVIX) 75 MG tablet, Take 1 tablet (75 mg total) by mouth daily., Disp: 90 tablet, Rfl: 3 .  dexlansoprazole (DEXILANT) 60 MG capsule, Take 60 mg by mouth daily., Disp: , Rfl:  .  escitalopram (LEXAPRO) 20 MG tablet, Take 20 mg by mouth daily., Disp: , Rfl:  .  fexofenadine (ALLEGRA) 180 MG tablet, Take 180 mg by mouth daily., Disp: , Rfl:  .  gabapentin (NEURONTIN) 300 MG capsule, Take 1 capsule by mouth 3  times daily, Disp: 270 capsule, Rfl: 1 .  LORazepam (ATIVAN) 1 MG tablet, Take 1 mg by mouth at bedtime. For sleeping per patient, Disp: , Rfl:  .  lovastatin (MEVACOR) 40 MG tablet,  Take 40 mg by mouth at bedtime. , Disp: , Rfl:  .  meclizine (ANTIVERT) 25 MG tablet, Take 12.5-25 mg by mouth 3 (three) times daily as needed for dizziness., Disp: , Rfl:  .  metoprolol succinate (TOPROL-XL) 25 MG 24 hr tablet, Take 0.5 tablets (12.5 mg total) by mouth at bedtime., Disp: 30 tablet, Rfl: 3 .  nitroGLYCERIN (NITROSTAT) 0.4 MG SL tablet, Place 1 tablet (0.4 mg total) under the tongue every 5 (five) minutes as needed for chest pain (CP or SOB)., Disp: 25 tablet, Rfl: 2 .  sodium chloride (OCEAN) 0.65 % SOLN nasal spray, Place 2 sprays into both nostrils as needed for congestion., Disp: , Rfl:   Past Medical History: Past Medical History:  Diagnosis Date  . Anemia    hx  . Anxiety and depression   . Arthritis    "~ all over my body; in the joints"  . Breast cancer, right ([place]) 3/16  . Coronary artery disease    a.  s/p NSTEMI in June 2017 with DES to mid LAD.  [place] Kitchen Depression   . GERD (gastroesophageal reflux disease)   . H/O hiatal hernia   . Heart murmur   . History of blood transfusion    "when taking chemo & w/one of my knee replacements" (11/05/2015)  . Hypercholesteremia   . Hypertension   . Orthostatic hypotension   .  Pneumonia X 1  . Shingles 5/15   hx  . Shortness of breath    "stomach is in lungs" when had hernia 2014 none now(07/28/14)  . Sinus headache     Tobacco Use: History  Smoking Status  . Never Smoker  Smokeless Tobacco  . Never Used    Labs: Recent Review Flowsheet Data    Labs for ITP Cardiac and Pulmonary Rehab Latest Ref Rng & Units 11/04/2006 09/20/2013 09/21/2013 11/05/2015 11/06/2015   Cholestrol 0 - 200 mg/dL - - 166 - 158   LDLCALC 0 - 99 mg/dL - - 90 - 88   HDL >40 mg/dL - - 55 - 47   Trlycerides <150 mg/dL - - 107 - 113   Hemoglobin A1c 4.8 - 5.6 % - 5.6 - 5.5 -   HCO3 - 30.1(H) - - - -   TCO2 0 - 100 mmol/L 31 28 - - -      Capillary Blood Glucose: Lab Results  Component Value Date   GLUCAP 176 (H) 09/20/2013   GLUCAP 99  09/20/2013     Exercise Target Goals:    Exercise Program Goal: Individual exercise prescription set with THRR, safety & activity barriers. Participant demonstrates ability to understand and report RPE using BORG scale, to self-measure pulse accurately, and to acknowledge the importance of the exercise prescription.  Exercise Prescription Goal: Starting with aerobic activity 30 plus minutes a day, 3 days per week for initial exercise prescription. Provide home exercise prescription and guidelines that participant acknowledges understanding prior to discharge.  Activity Barriers & Risk Stratification:     Activity Barriers & Cardiac Risk Stratification - 12/17/15 0823      Activity Barriers & Cardiac Risk Stratification   Activity Barriers History of Falls;Back Problems;Other (comment);Shortness of Breath;Assistive Device;Joint Problems   Comments 2 fusion in lower back, broken R thumb and neck pain   Cardiac Risk Stratification High      6 Minute Walk:     6 Minute Walk    Row Name 12/17/15 1553         6 Minute Walk   Phase Initial     Distance 1208 feet     Walk Time 6 minutes     # of Rest Breaks 0     MPH 2.28     METS 2.2     RPE 11     VO2 Peak 7.7     Symptoms No     Resting HR 60 bpm     Resting BP 117/84     Max Ex. HR 92 bpm     Max Ex. BP 129/85     2 Minute Post BP 118/80        Initial Exercise Prescription:     Initial Exercise Prescription - 12/17/15 1500      Date of Initial Exercise RX and Referring Provider   Date 12/17/15   Referring Provider Candee Furbish, MD     Treadmill   MPH 1.7   Grade 0   Minutes 10   METs 2.3     NuStep   Level 2   Minutes 10   METs 1.5     Arm Ergometer   Level 1   Minutes 10   METs 1     Prescription Details   Frequency (times per week) 3   Duration Progress to 30 minutes of continuous aerobic without signs/symptoms of physical distress     Intensity   THRR 40-80% of  Max Heartrate 60-119    Ratings of Perceived Exertion 11-13   Perceived Dyspnea 0-4     Progression   Progression Continue to progress workloads to maintain intensity without signs/symptoms of physical distress.     Resistance Training   Training Prescription Yes   Weight 1lb   Reps 10-12      Perform Capillary Blood Glucose checks as needed.  Exercise Prescription Changes:      Exercise Prescription Changes    Row Name 01/07/16 1300 01/25/16 1700 02/22/16 1600         Exercise Review   Progression Yes Yes Yes       Response to Exercise   Blood Pressure (Admit) 110/72 112/82 112/80     Blood Pressure (Exercise) 124/80 132/70 125/88     Blood Pressure (Exit) 120/78 104/60 116/79     Heart Rate (Admit) 67 bpm 77 bpm 75 bpm     Heart Rate (Exercise) 103 bpm 103 bpm 95 bpm     Heart Rate (Exit) 67 bpm 72 bpm 75 bpm     Rating of Perceived Exertion (Exercise) '13 12 14     ' Comments  - Reviewed HEP on 01/01/16 Reviewed HEP on 01/01/16     Duration Progress to 30 minutes of continuous aerobic without signs/symptoms of physical distress Progress to 30 minutes of continuous aerobic without signs/symptoms of physical distress Progress to 30 minutes of continuous aerobic without signs/symptoms of physical distress     Intensity THRR unchanged THRR unchanged THRR unchanged       Progression   Progression Continue to progress workloads to maintain intensity without signs/symptoms of physical distress. Continue to progress workloads to maintain intensity without signs/symptoms of physical distress. Continue to progress workloads to maintain intensity without signs/symptoms of physical distress.     Average METs 2.2 3.1 2.7       Resistance Training   Training Prescription Yes Yes Yes     Weight 1lb 2lbs 2lbs     Reps 10-12 10-12 10-12       NuStep   Level '2 4 5     ' Minutes '10 10 10     ' METs 1.6 3.6 3       Arm Ergometer   Level '1 1 1     ' Minutes '10 10 10     ' METs 1 1 2.74       Track   Laps '9 10 6      ' Minutes '10 10 10     ' METs 2.39 2.74 2.03  pt not feeling wel;,  encrged drinking H2O/  pt felt better       Home Exercise Plan   Plans to continue exercise at Home  Reviewed on 01/01/16 see progress note Home  Reviewed on 01/01/16 see progress note Home  Reviewed on 01/01/16 see progress note     Frequency Add 2 additional days to program exercise sessions. Add 2 additional days to program exercise sessions. Add 2 additional days to program exercise sessions.        Exercise Comments:      Exercise Comments    Row Name 12/25/15 1119 01/07/16 1305 01/25/16 1712 02/22/16 1654     Exercise Comments There are no changes to current Ex Rx Reviewed MET's. Pt is tolerating exercise well; will continue to monitor exercise progression. Reviewed MET's and goals. Pt is tolerating exercise well; will continue to monitor exercise progression. Reviewed MET's and goals. Pt is tolerating exercise well; will continue  to monitor exercise progression.       Discharge Exercise Prescription (Final Exercise Prescription Changes):     Exercise Prescription Changes - 02/22/16 1600      Exercise Review   Progression Yes     Response to Exercise   Blood Pressure (Admit) 112/80   Blood Pressure (Exercise) 125/88   Blood Pressure (Exit) 116/79   Heart Rate (Admit) 75 bpm   Heart Rate (Exercise) 95 bpm   Heart Rate (Exit) 75 bpm   Rating of Perceived Exertion (Exercise) 14   Comments Reviewed HEP on 01/01/16   Duration Progress to 30 minutes of continuous aerobic without signs/symptoms of physical distress   Intensity THRR unchanged     Progression   Progression Continue to progress workloads to maintain intensity without signs/symptoms of physical distress.   Average METs 2.7     Resistance Training   Training Prescription Yes   Weight 2lbs   Reps 10-12     NuStep   Level 5   Minutes 10   METs 3     Arm Ergometer   Level 1   Minutes 10   METs 2.74     Track   Laps 6   Minutes 10    METs 2.03  pt not feeling wel;,  encrged drinking H2O/  pt felt better     Home Exercise Plan   Plans to continue exercise at Home  Reviewed on 01/01/16 see progress note   Frequency Add 2 additional days to program exercise sessions.      Nutrition:  Target Goals: Understanding of nutrition guidelines, daily intake of sodium <1535m, cholesterol <204m calories 30% from fat and 7% or less from saturated fats, daily to have 5 or more servings of fruits and vegetables.  Biometrics:     Pre Biometrics - 12/17/15 1551      Pre Biometrics   Waist Circumference 36 inches   Hip Circumference 43.5 inches   Waist to Hip Ratio 0.83 %   Triceps Skinfold 44 mm   % Body Fat 45.6 %   Grip Strength 21 kg   Flexibility 11.5 in   Single Leg Stand 3.75 seconds       Nutrition Therapy Plan and Nutrition Goals:     Nutrition Therapy & Goals - 12/18/15 1347      Nutrition Therapy   Diet Therapeutic Lifestyle Changes     Personal Nutrition Goals   Personal Goal #1 1-2 lb wt loss/week to a goal wt loss of 6-24 lb [facility] and counsel regarding individualized specific dietary modifications aiming towards targeted core components such as weight, hypertension, lipid management, diabetes, heart failure and other comorbidities.   Expected Outcomes Short Term Goal: Understand basic principles of dietary content, such as calories, fat, sodium, cholesterol and nutrients.;Long Term Goal: Adherence to prescribed nutrition plan.      Nutrition Discharge: Nutrition Scores:     Nutrition Assessments - 01/01/16 1437      MEDFICTS Scores   Pre Score 24      Nutrition Goals Re-Evaluation:   Psychosocial: Target Goals: Acknowledge presence or absence of depression, maximize coping skills, provide positive support system. Participant is able to verbalize types and ability to use techniques and skills needed for  reducing stress and depression.  Initial Review & Psychosocial Screening:     Initial Psych Review & Screening - 12/21/15 1637  Initial Review   Current issues with History of Depression     Family Dynamics   Good Support System? Yes   Comments strong faith base      Barriers   Psychosocial barriers to participate in program The patient should benefit from training in stress management and relaxation.     Screening Interventions   Interventions Encouraged to exercise      Quality of Life Scores:     Quality of Life - 02/10/16 1705      Quality of Life Scores   GLOBAL Pre --  pt low qol scores mostly indicative of her health related anxiety  and depression. pt has started antidepressant per PCP and states relief of symptoms. pt enjoys adult coloring and spending time with family.       PHQ-9: Recent Review Flowsheet Data    Depression screen [facility] 2/9 12/21/2015 04/15/2015 02/03/2015 01/07/2015   Decreased Interest 0 0 0 1   Down, Depressed, Hopeless 1 0 0 1   PHQ - 2 Score 1 0 0 2   Altered sleeping - 0 - 1   Tired, decreased energy - 0 - 1   Change in appetite - 0 - 1   Feeling bad or failure about yourself  - 0 - 0   Trouble concentrating - 0 - 0   Moving slowly or fidgety/restless - 0 - 0   Suicidal thoughts - 0  - 0   PHQ-9 Score - 0 - 5   Difficult doing work/chores - Not difficult at all - Not difficult at all      Psychosocial Evaluation and Intervention:     Psychosocial Evaluation - 01/29/16 1624      Psychosocial Evaluation & Interventions   Interventions Stress management education;Relaxation education;Encouraged to exercise with the program and follow exercise prescription   Comments pt does have history of depression, which is better managed with recent medication adjustment per PCP.  pt mood is more light and upbeat.  pt admits she feels much better and is very grateful for the improvement.     Continued Psychosocial Services Needed Yes       Psychosocial Re-Evaluation:     Psychosocial Re-Evaluation    [place] Name 02/24/16 1700 02/24/16 1703           Psychosocial Re-Evaluation   Interventions Stress management education;Relaxation education;Encouraged to attend Cardiac Rehabilitation for the exercise  -      Comments pt mood appears lighter,more relaxed. pt interacts appropriately with her peers and staff in group exercise setting. pt is very happy her husbands recent tests were negative for cancer. pt demonstrates decreased depression, increased smiles and laughter, and tolerates increased exercise workloads.   -      Continued Psychosocial Services Needed No -  pt depression is being managed by her PCP         Vocational Rehabilitation: Provide vocational rehab assistance to qualifying candidates.   Vocational Rehab Evaluation & Intervention:     Vocational Rehab - 12/17/15 1802      Initial Vocational Rehab Evaluation & Intervention   Assessment shows need for Vocational Rehabilitation No  Pt is retired and does not plan to return to [employer].      Education: Education Goals: Education classes will be provided on a weekly basis, covering required topics. Participant will state understanding/return demonstration of topics presented.  Learning Barriers/Preferences:     Learning Barriers/Preferences - 12/17/15 0825      Learning Barriers/Preferences  Learning Barriers Sight   Learning Preferences Skilled Demonstration;Written Material;Pictoral;Video      Education Topics: Count Your Pulse:  -Group instruction provided by verbal instruction, demonstration, patient participation and written materials to support subject.  Instructors address importance of being able to find your pulse and how to count your pulse when at home without a heart monitor.  Patients get hands on experience counting their pulse with staff help and individually.   Heart Attack, Angina, and Risk Factor Modification:   -Group instruction provided by verbal instruction, video, and written materials to support subject.  Instructors address signs and symptoms of angina and heart attacks.    Also discuss risk factors for heart disease and how to make changes to improve heart health risk factors. Flowsheet Row CARDIAC REHAB PHASE II EXERCISE from 02/03/2016 in [place]  Date  01/06/16  Educator  RN  Instruction Review Code  2- meets goals/outcomes      Functional Fitness:  -Group instruction provided by verbal instruction, demonstration, patient participation, and written materials to support subject.  Instructors address safety measures for doing things around the house.  Discuss how to get up and down off the floor, how to pick things up properly, how to safely get out of a chair without assistance, and balance training. Flowsheet Row CARDIAC REHAB PHASE II EXERCISE from 02/03/2016 in [place]  Date  01/29/16  Instruction Review Code  2- meets goals/outcomes      Meditation and Mindfulness:  -Group instruction provided by verbal instruction, patient participation, and written materials to support subject.  Instructor addresses importance of mindfulness and meditation practice to help reduce stress and improve awareness.  Instructor also leads participants through a meditation exercise.    Stretching for Flexibility and Mobility:  -Group instruction provided by verbal instruction, patient participation, and written materials to support subject.  Instructors lead participants through series of stretches that are designed to increase flexibility thus improving mobility.  These stretches are additional exercise for major muscle groups that are typically performed during regular warm up and cool down.   Hands Only CPR Anytime:  -Group instruction provided by verbal instruction, video, patient participation and written materials to support subject.   Instructors co-teach with AHA video for hands only CPR.  Participants get hands on experience with mannequins.   Nutrition I class: Heart Healthy Eating:  -Group instruction provided by PowerPoint slides, verbal discussion, and written materials to support subject matter. The instructor gives an explanation and review of the Therapeutic Lifestyle Changes diet recommendations, which includes a discussion on lipid goals, dietary fat, sodium, fiber, plant stanol/sterol esters, sugar, and the components of a well-balanced, healthy diet. Flowsheet Row CARDIAC REHAB PHASE II EXERCISE from 02/03/2016 in [place]  Date  01/01/16  Educator  RD  Instruction Review Code  Not applicable [class handouts given]      Nutrition II class: Lifestyle Skills:  -Group instruction provided by PowerPoint slides, verbal discussion, and written materials to support subject matter. The instructor gives an explanation and review of label reading, grocery shopping for heart health, heart healthy recipe modifications, and ways to make healthier choices when eating out. Flowsheet Row CARDIAC REHAB PHASE II EXERCISE from 02/03/2016 in [place]  Date  01/01/16  Educator  RD  Instruction Review Code  Not applicable [class handouts given]      Diabetes Question & Answer:  -Group instruction provided  by PowerPoint slides, verbal discussion, and written materials to support subject matter. The instructor gives an explanation and review of diabetes co-morbidities, pre- and post-prandial blood glucose goals, pre-exercise blood glucose goals, signs, symptoms, and treatment of hypoglycemia and hyperglycemia, and foot care basics. Flowsheet Row CARDIAC REHAB PHASE II EXERCISE from 02/03/2016 in [place]  Date  12/25/15  Educator  RD  Instruction Review Code  2- meets goals/outcomes      Diabetes Blitz:  -Group instruction  provided by PowerPoint slides, verbal discussion, and written materials to support subject matter. The instructor gives an explanation and review of the physiology behind type 1 and type 2 diabetes, diabetes medications and rational behind using different medications, pre- and post-prandial blood glucose recommendations and Hemoglobin A1c goals, diabetes diet, and exercise including blood glucose guidelines for exercising safely.    Portion Distortion:  -Group instruction provided by PowerPoint slides, verbal discussion, written materials, and food models to support subject matter. The instructor gives an explanation of serving size versus portion size, changes in portions sizes over the last 20 years, and what consists of a serving from each food group. Flowsheet Row CARDIAC REHAB PHASE II EXERCISE from 02/03/2016 in [place]  Date  12/23/15  Educator  RD  Instruction Review Code  2- meets goals/outcomes      Stress Management:  -Group instruction provided by verbal instruction, video, and written materials to support subject matter.  Instructors review role of stress in heart disease and how to cope with stress positively.     Exercising on Your Own:  -Group instruction provided by verbal instruction, power point, and written materials to support subject.  Instructors discuss benefits of exercise, components of exercise, frequency and intensity of exercise, and end points for exercise.  Also discuss use of nitroglycerin and activating EMS.  Review options of places to exercise outside of rehab.  Review guidelines for sex with heart disease. Flowsheet Row CARDIAC REHAB PHASE II EXERCISE from 02/03/2016 in [place]  Date  02/03/16  Instruction Review Code  2- meets goals/outcomes      Cardiac Drugs I:  -Group instruction provided by verbal instruction and written materials to support subject.  Instructor reviews cardiac drug  classes: antiplatelets, anticoagulants, beta blockers, and statins.  Instructor discusses reasons, side effects, and lifestyle considerations for each drug class. Flowsheet Row CARDIAC REHAB PHASE II EXERCISE from 02/03/2016 in [place]  Date  12/30/15  Educator  pharmD  Instruction Review Code  2- meets goals/outcomes      Cardiac Drugs II:  -Group instruction provided by verbal instruction and written materials to support subject.  Instructor reviews cardiac drug classes: angiotensin converting enzyme inhibitors (ACE-I), angiotensin II receptor blockers (ARBs), nitrates, and calcium channel blockers.  Instructor discusses reasons, side effects, and lifestyle considerations for each drug class. Flowsheet Row CARDIAC REHAB PHASE II EXERCISE from 02/03/2016 in [place]  Date  01/27/16  Educator  Pharm D  Instruction Review Code  2- meets goals/outcomes      Anatomy and Physiology of the Circulatory System:  -Group instruction provided by verbal instruction, video, and written materials to support subject.  Reviews functional anatomy of heart, how it relates to various diagnoses, and what role the heart plays in the overall system. Flowsheet Row CARDIAC REHAB PHASE II EXERCISE from 02/03/2016 in [place]  Date  01/20/16  Instruction Review Code  2- meets goals/outcomes      Knowledge Questionnaire Score:     Knowledge Questionnaire Score - 12/17/15 1536      Knowledge Questionnaire Score   Pre Score 21/24      Core Components/Risk Factors/Patient Goals at Admission:     Personal Goals and Risk Factors at Admission - 12/17/15 0828      Core Components/Risk Factors/Patient Goals on Admission    Weight Management Yes;Obesity   Intervention Weight Management: Develop a combined nutrition and exercise program designed to reach desired caloric intake, while maintaining appropriate  intake of nutrient and fiber, sodium and fats, and appropriate energy expenditure required for the weight goal.;Weight Management: Provide education and appropriate resources to help participant work on and attain dietary goals.;Weight Management/Obesity: Establish reasonable short term and long term weight goals.;Obesity: Provide education and appropriate resources to help participant work on and attain dietary goals.   Expected Outcomes Short Term: Continue to assess and modify interventions until short term weight is achieved;Weight Maintenance: Understanding of the daily nutrition guidelines, which includes 25-35% calories from fat, 7% or less cal from saturated fats, less than 228m cholesterol, less than 1.5gm of sodium, & 5 or more servings of fruits and vegetables daily;Weight Loss: Understanding of general recommendations for a balanced deficit meal plan, which promotes 1-2 lb weight loss per week and includes a negative energy balance of [phone] kcal/d;Understanding recommendations for meals to include 15-35% energy as protein, 25-35% energy from fat, 35-60% energy from carbohydrates, less than 2039mof dietary cholesterol, 20-35 gm of total fiber daily;Understanding of distribution of calorie intake throughout the day with the consumption of 4-5 meals/snacks   Increase Strength and Stamina Yes   Intervention Provide advice, education, support and counseling about physical activity/exercise needs.;Develop an individualized exercise prescription for aerobic and resistive training based on initial evaluation findings, risk stratification, comorbidities and participant's personal goals.   Expected Outcomes Achievement of increased cardiorespiratory fitness and enhanced flexibility, muscular endurance and strength shown through measurements of functional capacity and personal statement of participant.   Improve shortness of breath with ADL's Yes   Intervention Provide education, individualized exercise  plan and daily activity instruction to help decrease symptoms of SOB with activities of daily living.   Expected Outcomes Short Term: Achieves a reduction of symptoms when performing activities of daily living.   Hypertension Yes   Intervention Provide education on lifestyle modifcations including regular physical activity/exercise, weight management, moderate sodium restriction and increased consumption of fresh fruit, vegetables, and low fat dairy, alcohol moderation, and smoking cessation.;Monitor prescription use compliance.   Expected Outcomes Short Term: Continued assessment and intervention until BP is < 140/9047mG in hypertensive participants. < 130/27m73m in hypertensive participants with diabetes, heart failure or chronic kidney disease.;Long Term: Maintenance of blood pressure at goal levels.   Lipids Yes   Intervention Provide education and support for participant on nutrition & aerobic/resistive exercise along with prescribed medications to achieve LDL <70mg66mL >40mg.42mxpected Outcomes Short Term: Participant states understanding of desired cholesterol values and is compliant with medications prescribed. Participant is following exercise prescription and nutrition guidelines.;Long Term: Cholesterol controlled with medications as prescribed, with individualized exercise RX and with personalized nutrition plan. Value goals: LDL < 70mg, 57m> 40 mg.   Personal Goal Other Yes   Personal Goal short: Be able to do more activities with less fatigue  Long: Be able to climb stairs with less SOB and lose 20lbs  Intervention Provide nutrition and education counseling to assist with increaseing exercise capacity, losing weight and increasing energy levels   Expected Outcomes Pt will be able to lose wt, do more activities and be able to climb stairs without difficulties      Core Components/Risk Factors/Patient Goals Review:      Goals and Risk Factor Review    Row Name 01/25/16 1712  02/24/16 1716           Core Components/Risk Factors/Patient Goals Review   Personal Goals Review Other Improve shortness of breath with ADL's      Review Pt is doing well with ADL's and steady improving exercise tolerance Pt is able to climb stairs with less fatigue and SOB. Pt states " it easier to climb stairs"      Expected Outcomes Pt will continue to improve in cardiovascular fitness and be able to do household chores without difficulty. Pt will continue to improve in cardiovascular fitness and be able to perform functional activites without difficulty         Core Components/Risk Factors/Patient Goals at Discharge (Final Review):      Goals and Risk Factor Review - 02/24/16 1716      Core Components/Risk Factors/Patient Goals Review   Personal Goals Review Improve shortness of breath with ADL's   Review Pt is able to climb stairs with less fatigue and SOB. Pt states " it easier to climb stairs"   Expected Outcomes Pt will continue to improve in cardiovascular fitness and be able to perform functional activites without difficulty      ITP Comments:     ITP Comments    Row Name 12/17/15 0819 02/19/16 1443         ITP Comments Dr. Fransico Him, Medical Director attended Hypertension education lecture         Comments: Pt is making expected progress toward personal goals after completing 22 sessions. Recommend continued exercise and life style modification education including  stress management and relaxation techniques to decrease cardiac risk profile.

## 2016-02-26 ENCOUNTER — Encounter (HOSPITAL_COMMUNITY)
Admission: RE | Admit: 2016-02-26 | Discharge: 2016-02-26 | Disposition: A | Discharge status: Home / Self Care | Payer: Medicare Other | Source: Ambulatory Visit | Attending: Cardiology | Admitting: Cardiology

## 2016-02-26 DIAGNOSIS — I214 Non-ST elevation (NSTEMI) myocardial infarction: Secondary | ICD-10-CM

## 2016-02-26 DIAGNOSIS — Z955 Presence of coronary angioplasty implant and graft: Secondary | ICD-10-CM

## 2016-02-29 ENCOUNTER — Encounter (HOSPITAL_COMMUNITY)
Admission: RE | Admit: 2016-02-29 | Discharge: 2016-02-29 | Disposition: A | Discharge status: Home / Self Care | Payer: Medicare Other | Source: Ambulatory Visit | Attending: Cardiology | Admitting: Cardiology

## 2016-02-29 DIAGNOSIS — Z955 Presence of coronary angioplasty implant and graft: Secondary | ICD-10-CM

## 2016-02-29 DIAGNOSIS — I214 Non-ST elevation (NSTEMI) myocardial infarction: Secondary | ICD-10-CM

## 2016-03-02 ENCOUNTER — Encounter (HOSPITAL_COMMUNITY)
Admission: RE | Admit: 2016-03-02 | Discharge: 2016-03-02 | Disposition: A | Discharge status: Home / Self Care | Payer: Medicare Other | Source: Ambulatory Visit | Attending: Cardiology | Admitting: Cardiology

## 2016-03-02 DIAGNOSIS — I214 Non-ST elevation (NSTEMI) myocardial infarction: Secondary | ICD-10-CM | POA: Diagnosis not present

## 2016-03-02 DIAGNOSIS — Z955 Presence of coronary angioplasty implant and graft: Secondary | ICD-10-CM

## 2016-03-04 ENCOUNTER — Encounter (HOSPITAL_COMMUNITY): Payer: Medicare Other

## 2016-03-07 ENCOUNTER — Encounter (HOSPITAL_COMMUNITY)
Admission: RE | Admit: 2016-03-07 | Discharge: 2016-03-07 | Disposition: A | Discharge status: Home / Self Care | Payer: Medicare Other | Source: Ambulatory Visit | Attending: Cardiology | Admitting: Cardiology

## 2016-03-07 DIAGNOSIS — Z955 Presence of coronary angioplasty implant and graft: Secondary | ICD-10-CM

## 2016-03-07 DIAGNOSIS — I214 Non-ST elevation (NSTEMI) myocardial infarction: Secondary | ICD-10-CM | POA: Diagnosis not present

## 2016-03-09 ENCOUNTER — Encounter (HOSPITAL_COMMUNITY)
Admission: RE | Admit: 2016-03-09 | Discharge: 2016-03-09 | Disposition: A | Discharge status: Home / Self Care | Payer: Medicare Other | Source: Ambulatory Visit | Attending: Cardiology | Admitting: Cardiology

## 2016-03-09 DIAGNOSIS — I214 Non-ST elevation (NSTEMI) myocardial infarction: Secondary | ICD-10-CM

## 2016-03-09 DIAGNOSIS — Z955 Presence of coronary angioplasty implant and graft: Secondary | ICD-10-CM

## 2016-03-11 ENCOUNTER — Encounter: Payer: Self-pay | Admitting: Cardiology

## 2016-03-11 ENCOUNTER — Encounter (HOSPITAL_COMMUNITY): Payer: Medicare Other

## 2016-03-14 ENCOUNTER — Encounter (HOSPITAL_COMMUNITY)
Admission: RE | Admit: 2016-03-14 | Discharge: 2016-03-14 | Disposition: A | Discharge status: Home / Self Care | Payer: Medicare Other | Source: Ambulatory Visit | Attending: Cardiology | Admitting: Cardiology

## 2016-03-14 DIAGNOSIS — I214 Non-ST elevation (NSTEMI) myocardial infarction: Secondary | ICD-10-CM | POA: Diagnosis not present

## 2016-03-14 DIAGNOSIS — Z955 Presence of coronary angioplasty implant and graft: Secondary | ICD-10-CM

## 2016-03-15 ENCOUNTER — Other Ambulatory Visit: Payer: Self-pay

## 2016-03-15 DIAGNOSIS — C50411 Malignant neoplasm of upper-outer quadrant of right female breast: Secondary | ICD-10-CM

## 2016-03-15 NOTE — Assessment & Plan Note (Signed)
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 Adjuvant chemo with TC X 4, started 10/15/2014 completed 12/16/2014 (I chose his regimen because patient appears to be slightly older than her stated age and I'm concerned about toxicity to standard full dose chemotherapy with Adriamycin and Cytoxan followed by Taxol. In addition patient is also very reluctant to consider aggressive chemotherapy because she felt that her sister died because of toxicities of chemotherapy.) 2. 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 Hemoglobin 9.5 Treatment plan: There is no role of antiestrogen therapy since she is ER/PR negative. Patient will go on surveillance for breast cancer.  Ataxia and dizziness with recent fall: I would like to obtain an MRI of the brain. I will call her with the result of the MRI Physical therapy consult for limitation of range of motion in the right arm  Patient completed live strong program as well as [facility] program Return to clinic in 6 months for follow-up

## 2016-03-16 ENCOUNTER — Encounter (HOSPITAL_COMMUNITY)
Admission: RE | Admit: 2016-03-16 | Discharge: 2016-03-16 | Disposition: A | Discharge status: Home / Self Care | Payer: Medicare Other | Source: Ambulatory Visit | Attending: Cardiology | Admitting: Cardiology

## 2016-03-16 ENCOUNTER — Encounter: Payer: Self-pay | Admitting: Hematology and Oncology

## 2016-03-16 ENCOUNTER — Ambulatory Visit (HOSPITAL_BASED_OUTPATIENT_CLINIC_OR_DEPARTMENT_OTHER): Payer: Medicare Other | Admitting: Hematology and Oncology

## 2016-03-16 ENCOUNTER — Other Ambulatory Visit (HOSPITAL_BASED_OUTPATIENT_CLINIC_OR_DEPARTMENT_OTHER): Payer: Medicare Other

## 2016-03-16 DIAGNOSIS — Z955 Presence of coronary angioplasty implant and graft: Secondary | ICD-10-CM | POA: Insufficient documentation

## 2016-03-16 DIAGNOSIS — D6481 Anemia due to antineoplastic chemotherapy: Secondary | ICD-10-CM | POA: Diagnosis not present

## 2016-03-16 DIAGNOSIS — Z170 Estrogen receptor positive status [ER+]: Secondary | ICD-10-CM

## 2016-03-16 DIAGNOSIS — I214 Non-ST elevation (NSTEMI) myocardial infarction: Secondary | ICD-10-CM | POA: Diagnosis not present

## 2016-03-16 DIAGNOSIS — Z853 Personal history of malignant neoplasm of breast: Secondary | ICD-10-CM

## 2016-03-16 DIAGNOSIS — C50411 Malignant neoplasm of upper-outer quadrant of right female breast: Secondary | ICD-10-CM

## 2016-03-16 LAB — CBC WITH DIFFERENTIAL/PLATELET
BASO%: 0.4 % (ref 0.0–2.0)
BASOS ABS: 0 10*3/uL (ref 0.0–0.1)
EOS%: 6.5 % (ref 0.0–7.0)
Eosinophils Absolute: 0.4 10*3/uL (ref 0.0–0.5)
HEMATOCRIT: 38.7 % (ref 34.8–46.6)
HGB: 12.6 g/dL (ref 11.6–15.9)
LYMPH#: 1.1 10*3/uL (ref 0.9–3.3)
LYMPH%: 16.9 % (ref 14.0–49.7)
MCH: 29.6 pg (ref 25.1–34.0)
MCHC: 32.6 g/dL (ref 31.5–36.0)
MCV: 90.7 fL (ref 79.5–101.0)
MONO#: 0.5 10*3/uL (ref 0.1–0.9)
MONO%: 7.8 % (ref 0.0–14.0)
NEUT#: 4.5 10*3/uL (ref 1.5–6.5)
NEUT%: 68.4 % (ref 38.4–76.8)
Platelets: 183 10*3/uL (ref 145–400)
RBC: 4.27 10*6/uL (ref 3.70–5.45)
RDW: 13.3 % (ref 11.2–14.5)
WBC: 6.6 10*3/uL (ref 3.9–10.3)

## 2016-03-16 LAB — COMPREHENSIVE METABOLIC PANEL
ALT: 11 U/L (ref 0–55)
AST: 15 U/L (ref 5–34)
Albumin: 3.3 g/dL — ABNORMAL LOW (ref 3.5–5.0)
Alkaline Phosphatase: 96 U/L (ref 40–150)
Anion Gap: 8 mEq/L (ref 3–11)
BUN: 9.6 mg/dL (ref 7.0–26.0)
CALCIUM: 9.3 mg/dL (ref 8.4–10.4)
CHLORIDE: 102 meq/L (ref 98–109)
CO2: 31 mEq/L — ABNORMAL HIGH (ref 22–29)
Creatinine: 0.9 mg/dL (ref 0.6–1.1)
EGFR: 67 mL/min/{1.73_m2} — ABNORMAL LOW (ref >90)
Glucose: 86 mg/dl (ref 70–140)
POTASSIUM: 4.3 meq/L (ref 3.5–5.1)
Sodium: 141 mEq/L (ref 136–145)
Total Bilirubin: 0.63 mg/dL (ref 0.20–1.20)
Total Protein: 7.6 g/dL (ref 6.4–8.3)

## 2016-03-16 NOTE — Progress Notes (Signed)
Patient Care Team: Gaynelle Arabian, MD as PCP - General (Family Medicine) Jerline Pain, MD as Consulting Physician (Cardiology) Wonda Horner, MD as Consulting Physician (Gastroenterology) Ralene Ok, MD as Consulting Physician (Surgery) Nicholas Lose, MD as Consulting Physician (Hematology and Oncology) Sylvan Cheese, NP as Nurse Practitioner (Hematology and Oncology) [name], MD as Consulting Physician (General Surgery) Thea Silversmith, MD as Consulting Physician (Radiation Oncology)  DIAGNOSIS:  Encounter Diagnosis  Name Primary?  . Malignant neoplasm of upper-outer quadrant of right breast in female, estrogen receptor positive ([place])     SUMMARY OF ONCOLOGIC HISTORY:   Breast cancer of upper-outer quadrant of right female breast ([place])   07/30/2014 Mammogram    Right breast: Mass at 10 o'clock location 4 cm from the nipple measuring 1.7 x 1.3 x 1.3 cm. This corresponds to the palpable and mammographic finding. In the right axilla, there are several lymph nodes which are normal in size measuring up to 62m.      08/27/2014 Initial Biopsy    Right breast core needle bx: Invasive ductal carcinoma with LVI, grade 3, ER- (0%), PR- (0%), HER2/neu negative, Ki67 30%      08/27/2014 Clinical Stage    Stage IIA: T2 N0      09/22/2014 Definitive Surgery    Right lumpectomy/SLNB (Marlou Starks: Invasive ductal carcinoma, grade 3, 2.6 cm, high grade DCIS, LVI present, ER-, PR-, 2 LN removed and 1 positive for metastatic carcinoma (1/2)      09/22/2014 Pathologic Stage    Stage IIB: T2 N1 M0      10/15/2014 - 12/16/2014 Chemotherapy    Adjuvant chemotherapy with Taxotere Cytoxan 4 q3 weeks      10/21/2014 - 10/24/2014 Hospital Admission    Admission for neutropenic fever UTI Klebsiella      01/26/2015 - 03/11/2015 Radiation Therapy    Adjuvant RT (Pablo Ledger: Right breast / 45 Gray @ 1.8 GPearline Cablesper fraction x 25 fractions Right supraclavicular fossa/PAB 45 Gy '@1' .8 Gy per fraction  x 25 fractions Right breast boost / 16 Gray at 2[employer] Corporationper fraction x 8 fractions      05/19/2015 Survivorship    Survivorship care plan visit completed.       CHIEF COMPLIANT: Surveillance of triple negative right breast cancer  INTERVAL HISTORY: Angela KOSEKis a 70year old with above-mentioned history of triple negative right breast cancer who underwent right lumpectomy followed by adjuvant chemotherapy with Taxotere and Cytoxan and subsequently completed radiation therapy. She is currently on surveillance. 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 She underwent heart catheterization and stent placement with removal of the blood clot. She is doing much better since then. She denies any lumps or nodules in the breast. She has some tenderness and associated with a prior radiation.  REVIEW OF SYSTEMS:   Constitutional: Denies fevers, chills or abnormal weight loss Eyes: Denies blurriness of vision Ears, nose, mouth, throat, and face: Denies mucositis or sore throat Respiratory: Denies cough, dyspnea or wheezes Cardiovascular: Denies palpitation, chest discomfort Gastrointestinal:  Denies nausea, heartburn or change in bowel habits Skin: Denies abnormal skin rashes Lymphatics: Denies new lymphadenopathy or easy bruising Neurological:Denies numbness, tingling or new weaknesses Behavioral/Psych: Mood is stable, no new changes  Extremities: No lower extremity edema Breast: Right breast discomfort from prior radiation All other systems were reviewed with the patient and are negative.  I have reviewed the past  medical history, past surgical history, social history and family history with the patient and they are unchanged from previous note.  ALLERGIES:  is allergic to shellfish allergy; lactose intolerance (gi); brilinta [ticagrelor]; nitrofurantoin; penicillins; aleve [naproxen]; aspirin; ciprofloxacin;  doxycycline; evista [raloxifene]; fosamax [alendronate sodium]; latex; naproxen sodium; pravachol [pravastatin]; sulfa antibiotics; tape; welchol [colesevelam hcl]; and zocor [simvastatin].  MEDICATIONS:  Current Outpatient Prescriptions  Medication Sig Dispense Refill  . aspirin EC 81 MG EC tablet Take 1 tablet (81 mg total) by mouth daily.    . calcium carbonate (OS-CAL) 600 MG TABS Take 600 mg by mouth 2 (two) times daily with a meal.    . carboxymethylcellulose (REFRESH PLUS) 0.5 % SOLN Place 1 drop into both eyes QID.     Marland Kitchen cephALEXin (KEFLEX) 250 MG capsule Take 250 mg by mouth at bedtime.    . cephALEXin (KEFLEX) 500 MG capsule Take 1 capsule (500 mg total) by mouth 2 (two) times daily. 20 capsule 0  . Cholecalciferol (VITAMIN D) 2000 UNITS tablet Take 2,000 Units by mouth 2 (two) times daily.     . clopidogrel (PLAVIX) 75 MG tablet Take 1 tablet (75 mg total) by mouth daily. 90 tablet 3  . dexlansoprazole (DEXILANT) 60 MG capsule Take 60 mg by mouth daily.    Marland Kitchen escitalopram (LEXAPRO) 20 MG tablet Take 20 mg by mouth daily.    . fexofenadine (ALLEGRA) 180 MG tablet Take 180 mg by mouth daily.    Marland Kitchen gabapentin (NEURONTIN) 300 MG capsule Take 1 capsule by mouth 3  times daily 270 capsule 1  . LORazepam (ATIVAN) 1 MG tablet Take 1 mg by mouth at bedtime. For sleeping per patient    . lovastatin (MEVACOR) 40 MG tablet Take 40 mg by mouth at bedtime.     . meclizine (ANTIVERT) 25 MG tablet Take 12.5-25 mg by mouth 3 (three) times daily as needed for dizziness.    . metoprolol succinate (TOPROL-XL) 25 MG 24 hr tablet Take 0.5 tablets (12.5 mg total) by mouth at bedtime. 30 tablet 3  . nitroGLYCERIN (NITROSTAT) 0.4 MG SL tablet Place 1 tablet (0.4 mg total) under the tongue every 5 (five) minutes as needed for chest pain (CP or SOB). 25 tablet 2  . sodium chloride (OCEAN) 0.65 % SOLN nasal spray Place 2 sprays into both nostrils as needed for congestion.     No current facility-administered  medications for this visit.     PHYSICAL EXAMINATION: ECOG PERFORMANCE STATUS: 1 - Symptomatic but completely ambulatory  Vitals:   03/16/16 1018  BP: (!) 104/56  Pulse: (!) 58  Resp: 17  Temp: 98.1 F (36.7 C)   Filed Weights   03/16/16 1018  Weight: 165 lb 4.8 oz (75 kg)    GENERAL:alert, no distress and comfortable SKIN: skin color, texture, turgor are normal, no rashes or significant lesions EYES: normal, Conjunctiva are pink and non-injected, sclera clear OROPHARYNX:no exudate, no erythema and lips, buccal mucosa, and tongue normal  NECK: supple, thyroid normal size, non-tender, without nodularity LYMPH:  no palpable lymphadenopathy in the cervical, axillary or inguinal LUNGS: clear to auscultation and percussion with normal breathing effort HEART: regular rate & rhythm and no murmurs and no lower extremity edema ABDOMEN:abdomen soft, non-tender and normal bowel sounds MUSCULOSKELETAL:no cyanosis of digits and no clubbing  NEURO: alert & oriented x 3 with fluent speech, no focal motor/sensory deficits EXTREMITIES: No lower extremity edema BREAST: No palpable masses or nodules in either right or left breasts. No  palpable axillary supraclavicular or infraclavicular adenopathy no breast tenderness or nipple discharge. (exam performed in the presence of a chaperone)  LABORATORY DATA:  I have reviewed the data as listed   Chemistry      Component Value Date/Time   NA 140 01/16/2016 0213   NA 138 03/17/2015 1030   K 3.8 01/16/2016 0213   K 4.0 03/17/2015 1030   CL 104 01/16/2016 0213   CO2 27 01/16/2016 0213   CO2 25 03/17/2015 1030   BUN 9 01/16/2016 0213   BUN 16.8 03/17/2015 1030   CREATININE 0.80 01/16/2016 0213   CREATININE 0.8 03/17/2015 1030      Component Value Date/Time   CALCIUM 8.9 01/16/2016 0213   CALCIUM 9.0 03/17/2015 1030   ALKPHOS 78 11/05/2015 1045   ALKPHOS 83 03/17/2015 1030   AST 25 11/05/2015 1045   AST 133 (H) 03/17/2015 1030   ALT 14  11/05/2015 1045   ALT 94 (H) 03/17/2015 1030   BILITOT 0.6 11/05/2015 1045   BILITOT 0.77 03/17/2015 1030       Lab Results  Component Value Date   WBC 6.6 03/16/2016   HGB 12.6 03/16/2016   HCT 38.7 03/16/2016   MCV 90.7 03/16/2016   PLT 183 03/16/2016   NEUTROABS 4.5 03/16/2016     ASSESSMENT & PLAN:  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 Adjuvant chemo with TC X 4, started 10/15/2014 completed 12/16/2014 (I chose his regimen because patient appears to be slightly older than her stated age and I'm concerned about toxicity to standard full dose chemotherapy with Adriamycin and Cytoxan followed by Taxol. In addition patient is also very reluctant to consider aggressive chemotherapy because she felt that her sister died because of toxicities of chemotherapy.) 2. 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 Hemoglobin 9.5 Treatment plan: There is no role of antiestrogen therapy since she is ER/PR negative. Patient will go on surveillance for breast cancer.  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  Patient completed live strong program as well as [facility] program Return to clinic in 1 year for follow-up   No orders of the defined types were placed in this encounter.  The patient has a good understanding of the overall plan. she agrees with it. she will call with any problems that may develop before the next visit here.   Rulon Eisenmenger, MD 03/16/16

## 2016-03-18 ENCOUNTER — Encounter (HOSPITAL_COMMUNITY)
Admission: RE | Admit: 2016-03-18 | Discharge: 2016-03-18 | Disposition: A | Discharge status: Home / Self Care | Payer: Medicare Other | Source: Ambulatory Visit | Attending: Cardiology | Admitting: Cardiology

## 2016-03-18 DIAGNOSIS — Z955 Presence of coronary angioplasty implant and graft: Secondary | ICD-10-CM

## 2016-03-18 DIAGNOSIS — I214 Non-ST elevation (NSTEMI) myocardial infarction: Secondary | ICD-10-CM | POA: Diagnosis not present

## 2016-03-21 ENCOUNTER — Encounter (HOSPITAL_COMMUNITY)
Admission: RE | Admit: 2016-03-21 | Discharge: 2016-03-21 | Disposition: A | Discharge status: Home / Self Care | Payer: Medicare Other | Source: Ambulatory Visit | Attending: Cardiology | Admitting: Cardiology

## 2016-03-21 DIAGNOSIS — Z955 Presence of coronary angioplasty implant and graft: Secondary | ICD-10-CM

## 2016-03-21 DIAGNOSIS — I214 Non-ST elevation (NSTEMI) myocardial infarction: Secondary | ICD-10-CM

## 2016-03-23 ENCOUNTER — Ambulatory Visit (HOSPITAL_COMMUNITY)
Admission: RE | Admit: 2016-03-23 | Discharge: 2016-03-23 | Disposition: A | Discharge status: Home / Self Care | Payer: Medicare Other | Source: Ambulatory Visit | Attending: Cardiology | Admitting: Cardiology

## 2016-03-23 ENCOUNTER — Emergency Department (HOSPITAL_COMMUNITY)
Admission: EM | Admit: 2016-03-23 | Discharge: 2016-03-23 | Disposition: A | Discharge status: Home / Self Care | Payer: Medicare Other | Attending: Emergency Medicine | Admitting: Emergency Medicine

## 2016-03-23 ENCOUNTER — Encounter (HOSPITAL_COMMUNITY)
Admission: RE | Admit: 2016-03-23 | Discharge: 2016-03-23 | Disposition: A | Discharge status: Home / Self Care | Payer: Medicare Other | Source: Ambulatory Visit | Attending: Cardiology | Admitting: Cardiology

## 2016-03-23 ENCOUNTER — Emergency Department (HOSPITAL_COMMUNITY): Payer: Medicare Other

## 2016-03-23 ENCOUNTER — Other Ambulatory Visit: Payer: Self-pay

## 2016-03-23 ENCOUNTER — Encounter (HOSPITAL_COMMUNITY): Payer: Self-pay | Admitting: Emergency Medicine

## 2016-03-23 DIAGNOSIS — W1830XA Fall on same level, unspecified, initial encounter: Secondary | ICD-10-CM | POA: Insufficient documentation

## 2016-03-23 DIAGNOSIS — Z955 Presence of coronary angioplasty implant and graft: Secondary | ICD-10-CM

## 2016-03-23 DIAGNOSIS — Y939 Activity, unspecified: Secondary | ICD-10-CM | POA: Insufficient documentation

## 2016-03-23 DIAGNOSIS — Z96653 Presence of artificial knee joint, bilateral: Secondary | ICD-10-CM | POA: Diagnosis not present

## 2016-03-23 DIAGNOSIS — I10 Essential (primary) hypertension: Secondary | ICD-10-CM | POA: Insufficient documentation

## 2016-03-23 DIAGNOSIS — Z853 Personal history of malignant neoplasm of breast: Secondary | ICD-10-CM | POA: Insufficient documentation

## 2016-03-23 DIAGNOSIS — S8001XA Contusion of right knee, initial encounter: Secondary | ICD-10-CM | POA: Diagnosis not present

## 2016-03-23 DIAGNOSIS — W19XXXA Unspecified fall, initial encounter: Secondary | ICD-10-CM

## 2016-03-23 DIAGNOSIS — I214 Non-ST elevation (NSTEMI) myocardial infarction: Secondary | ICD-10-CM | POA: Diagnosis not present

## 2016-03-23 DIAGNOSIS — I252 Old myocardial infarction: Secondary | ICD-10-CM | POA: Diagnosis not present

## 2016-03-23 DIAGNOSIS — Y999 Unspecified external cause status: Secondary | ICD-10-CM | POA: Diagnosis not present

## 2016-03-23 DIAGNOSIS — I2510 Atherosclerotic heart disease of native coronary artery without angina pectoris: Secondary | ICD-10-CM | POA: Insufficient documentation

## 2016-03-23 DIAGNOSIS — Y9289 Other specified places as the place of occurrence of the external cause: Secondary | ICD-10-CM | POA: Diagnosis not present

## 2016-03-23 DIAGNOSIS — S8991XA Unspecified injury of right lower leg, initial encounter: Secondary | ICD-10-CM | POA: Diagnosis present

## 2016-03-23 DIAGNOSIS — Z91040 Latex allergy status: Secondary | ICD-10-CM | POA: Diagnosis not present

## 2016-03-23 DIAGNOSIS — S8000XA Contusion of unspecified knee, initial encounter: Secondary | ICD-10-CM

## 2016-03-23 DIAGNOSIS — Z7982 Long term (current) use of aspirin: Secondary | ICD-10-CM | POA: Insufficient documentation

## 2016-03-23 DIAGNOSIS — S8002XA Contusion of left knee, initial encounter: Secondary | ICD-10-CM | POA: Insufficient documentation

## 2016-03-23 DIAGNOSIS — Z79899 Other long term (current) drug therapy: Secondary | ICD-10-CM | POA: Insufficient documentation

## 2016-03-23 LAB — CBC
HCT: 38 % (ref 36.0–46.0)
Hemoglobin: 12.2 g/dL (ref 12.0–15.0)
MCH: 29.5 pg (ref 26.0–34.0)
MCHC: 32.1 g/dL (ref 30.0–36.0)
MCV: 92 fL (ref 78.0–100.0)
Platelets: 207 10*3/uL (ref 150–400)
RBC: 4.13 MIL/uL (ref 3.87–5.11)
RDW: 13.4 % (ref 11.5–15.5)
WBC: 6.5 10*3/uL (ref 4.0–10.5)

## 2016-03-23 LAB — BASIC METABOLIC PANEL
Anion gap: 9 (ref 5–15)
BUN: 8 mg/dL (ref 6–20)
CALCIUM: 9.2 mg/dL (ref 8.9–10.3)
CO2: 31 mmol/L (ref 22–32)
Chloride: 100 mmol/L — ABNORMAL LOW (ref 101–111)
Creatinine, Ser: 0.82 mg/dL (ref 0.44–1.00)
GFR calc Af Amer: >60 mL/min (ref >60)
GLUCOSE: 89 mg/dL (ref 65–99)
Potassium: 4.5 mmol/L (ref 3.5–5.1)
Sodium: 140 mmol/L (ref 135–145)

## 2016-03-23 LAB — I-STAT TROPONIN, ED: TROPONIN I, POC: 0 ng/mL (ref 0.00–0.08)

## 2016-03-23 NOTE — ED Notes (Signed)
Pt verbalized understanding discharge instructions and denies any further needs or questions at this time. VS stable, ambulatory and steady gait.   

## 2016-03-23 NOTE — ED Provider Notes (Signed)
[facility] DEPT Provider Note   CSN: [id number] Arrival date & time: 03/23/16  1357     History   Chief Complaint Chief Complaint  Patient presents with  . Dizziness  . Near Syncope  . Chest Pain    HPI Angela Bond is a 70 y.o. female.  She presents for evaluation of injuries to both knees in a fall. She was just getting to cardiac rehabilitation today when she fell, landing on her knees. She was able to get up with assistance, but sat in a wheelchair. She was sent here for evaluation afterwards. She describes 2 other falls this week, which occurred spontaneously and did not result in any injuries. She denies recent illnesses, including fever, chills, nausea, vomiting, cough, shortness of breath or chest pain. There've been no recent changes in her treatment plan. There are no other known modifying factors.  HPI  Past Medical History:  Diagnosis Date  . Anemia    hx  . Anxiety and depression   . Arthritis    "~ all over my body; in the joints"  . Breast cancer, right ([place]) 3/16  . Coronary artery disease    a.  s/p NSTEMI in June 2017 with DES to mid LAD.  Marland Kitchen Depression   . GERD (gastroesophageal reflux disease)   . H/O hiatal hernia   . Heart murmur   . History of blood transfusion    "when taking chemo & w/one of my knee replacements" (11/05/2015)  . Hypercholesteremia   . Hypertension   . Orthostatic hypotension   . Pneumonia X 1  . Shingles 5/15   hx  . Shortness of breath    "stomach is in lungs" when had hernia 2014 none now(07/28/14)  . Sinus headache     Patient Active Problem List   Diagnosis Date Noted  . CAD in native artery 01/16/2016  . Precordial pain 01/15/2016  . Hypotension 11/19/2015  . Cardiomyopathy, ischemic 11/19/2015  . Esophageal reflux   . Atypical chest pain   . NSTEMI (non-ST elevated myocardial infarction) ([place]) 11/05/2015  . Ataxia 09/15/2015  . Neutropenic fever ([place]) 12/24/2014  . Dehydration 12/24/2014  . Neutropenia  with fever ([place]) 12/23/2014  . Anemia due to chemotherapy 12/16/2014  . Neutropenia ([place]) 10/21/2014  . UTI (lower urinary tract infection) 10/21/2014  . Hypokalemia 10/21/2014  . Fall 10/16/2014  . Breast cancer of upper-outer quadrant of right female breast ([place]) 09/09/2014  . Lumbar spinal stenosis 07/31/2014  . Shingles 09/23/2013  . Chest pressure 09/20/2013  . Hypotension due to drugs 09/20/2013  . GERD (gastroesophageal reflux disease)   . Depression   . Hypercholesteremia   . Arthritis   . Abdominal pain, unspecified site 10/12/2012  . Atrial fibrillation ([place]) 08/12/2012  . Hypertension   . Sliding hiatal hernia s/p lap PEH repair 07/31/2012  . Other and unspecified angina pectoris 01/23/2012    Past Surgical History:  Procedure Laterality Date  . BACK SURGERY    . BREAST BIOPSY Right   . BREAST LUMPECTOMY WITH SENTINEL LYMPH NODE BIOPSY Right 09/22/2014   Procedure: RIGHT BREAST LUMPECTOMY WITH NEEDLE LOCALIZATION AND RIGHT AXILLARY SENTINEL LYMPH NODE BIOPSY;  Surgeon: [name], MD;  Location: [facility];  Service: General;  Laterality: Right;  . CARDIAC CATHETERIZATION    . CARDIAC CATHETERIZATION N/A 11/05/2015   Procedure: Left Heart Cath and Coronary Angiography;  Surgeon: Belva Crome, MD;  Location: [facility];  Service: Cardiovascular;  Laterality: N/A;  .  CARDIAC CATHETERIZATION N/A 11/05/2015   Procedure: Coronary Stent Intervention;  Surgeon: Belva Crome, MD;  Location: [facility];  Service: Cardiovascular;  Laterality: N/A;  . CARPAL TUNNEL RELEASE Right 09/2015  . CORONARY ANGIOPLASTY WITH STENT PLACEMENT  11/05/2015   "1 stent"  . ESOPHAGEAL MANOMETRY N/A 07/02/2012   Procedure: ESOPHAGEAL MANOMETRY (EM);  Surgeon: Lear Ng, MD;  Location: [facility];  Service: Endoscopy;  Laterality: N/A;  schooler to read  . EYE SURGERY Left 2013   tube placed tear duct  . HERNIA REPAIR    . HIATAL HERNIA REPAIR N/A 08/10/2012   Procedure:  LAPAROSCOPIC REPAIR OF HIATAL HERNIA WITH MESH AND EGD WITH PEG TUBE PLACEMENT;  Surgeon: Ralene Ok, MD;  Location: [facility];  Service: General;  Laterality: N/A;  . JOINT REPLACEMENT    . PORT-A-CATH REMOVAL  06/2015  . PORTACATH PLACEMENT Left 09/22/2014   Procedure: INSERTION PORT-A-CATH;  Surgeon: [name], MD;  Location: [facility];  Service: General;  Laterality: Left;  . POSTERIOR LUMBAR FUSION  2016   "had 2 vertebrae fused"  . TONSILLECTOMY  as child  . TOTAL KNEE ARTHROPLASTY Bilateral 2011,2012    OB History    No data available       Home Medications    Prior to Admission medications   Medication Sig Start Date End Date Taking? Authorizing Provider  aspirin EC 81 MG EC tablet Take 1 tablet (81 mg total) by mouth daily. 11/06/15  Yes Brittainy M Simmons, PA-C  calcium carbonate (OS-CAL) 600 MG TABS Take 600 mg by mouth 2 (two) times daily with a meal.   Yes Historical Provider, MD  carboxymethylcellulose (REFRESH PLUS) 0.5 % SOLN Place 1 drop into both eyes QID.    Yes Historical Provider, MD  cephALEXin (KEFLEX) 250 MG capsule Take 250 mg by mouth at bedtime. 01/20/16  Yes Historical Provider, MD  cephALEXin (KEFLEX) 500 MG capsule Take 1 capsule (500 mg total) by mouth 2 (two) times daily. 01/16/16  Yes Dayna N Dunn, PA-C  Cholecalciferol (VITAMIN D) 2000 UNITS tablet Take 2,000 Units by mouth 2 (two) times daily.    Yes Historical Provider, MD  clopidogrel (PLAVIX) 75 MG tablet Take 1 tablet (75 mg total) by mouth daily. 02/03/16  Yes Jerline Pain, MD  dexlansoprazole (DEXILANT) 60 MG capsule Take 60 mg by mouth daily.   Yes Historical Provider, MD  escitalopram (LEXAPRO) 20 MG tablet Take 20 mg by mouth daily. 01/22/16  Yes Historical Provider, MD  fexofenadine (ALLEGRA) 180 MG tablet Take 180 mg by mouth daily.   Yes Historical Provider, MD  gabapentin (NEURONTIN) 300 MG capsule Take 1 capsule by mouth 3  times daily 12/11/15  Yes Max T Hyatt, DPM  LORazepam (ATIVAN) 1 MG  tablet Take 1 mg by mouth at bedtime. For sleeping per patient 12/08/15  Yes Historical Provider, MD  lovastatin (MEVACOR) 40 MG tablet Take 40 mg by mouth at bedtime.    Yes Historical Provider, MD  meclizine (ANTIVERT) 25 MG tablet Take 12.5-25 mg by mouth 3 (three) times daily as needed for dizziness.   Yes Historical Provider, MD  metoprolol succinate (TOPROL-XL) 25 MG 24 hr tablet Take 0.5 tablets (12.5 mg total) by mouth at bedtime. 01/16/16  Yes Dayna N Dunn, PA-C  nitroGLYCERIN (NITROSTAT) 0.4 MG SL tablet Place 1 tablet (0.4 mg total) under the tongue every 5 (five) minutes as needed for chest pain (CP or SOB). 11/06/15  Yes Brittainy  Erie Noe, PA-C  sodium chloride (OCEAN) 0.65 % SOLN nasal spray Place 2 sprays into both nostrils as needed for congestion.   Yes Historical Provider, MD    Family History Family History  Problem Relation Age of Onset  . Heart disease Mother     67s, CHF  . Heart disease Father     d/o MI at 76  . Cancer Father     skin  . Diabetes Father   . Cancer Sister     breast  . Diabetes Sister   . Heart disease Sister     CABG in mid-60s    Social History Social History  Substance Use Topics  . Smoking status: Never Smoker  . Smokeless tobacco: Never Used  . Alcohol use No     Allergies   Shellfish allergy; Lactose intolerance (gi); Brilinta [ticagrelor]; Nitrofurantoin; Penicillins; Aleve [naproxen]; Aspirin; Ciprofloxacin; Doxycycline; Evista [raloxifene]; Fosamax [alendronate sodium]; Latex; Naproxen sodium; Pravachol [pravastatin]; Sulfa antibiotics; Tape; Welchol [colesevelam hcl]; and Zocor [simvastatin]   Review of Systems Review of Systems  All other systems reviewed and are negative.    Physical Exam Updated Vital Signs BP 104/67   Pulse 66   Temp 98.6 F (37 C) (Oral)   Resp 17   SpO2 98%   Physical Exam  Constitutional: She is oriented to person, place, and time. She appears well-developed.  Elderly, frail  HENT:  Head:  Normocephalic and atraumatic.  Eyes: Conjunctivae and EOM are normal. Pupils are equal, round, and reactive to light.  Neck: Normal range of motion and phonation normal. Neck supple.  Cardiovascular: Normal rate and regular rhythm.   Pulmonary/Chest: Effort normal and breath sounds normal. No respiratory distress. She exhibits no tenderness.  Abdominal: Soft. She exhibits no distension. There is no tenderness. There is no guarding.  Musculoskeletal: Normal range of motion.  Superficial bruising both knees anteriorly, with normal active and passive range of motion of the knees.  Neurological: She is alert and oriented to person, place, and time. She exhibits normal muscle tone.  Skin: Skin is warm and dry.  Psychiatric: She has a normal mood and affect. Her behavior is normal.  Nursing note and vitals reviewed.    ED Treatments / Results  Labs (all labs ordered are listed, but only abnormal results are displayed) Labs Reviewed  BASIC METABOLIC PANEL - Abnormal; Notable for the following:       Result Value   Chloride 100 (*)    All other components within normal limits  CBC  I-STAT TROPOININ, ED    EKG  EKG Interpretation  Date/Time:  Wednesday March 23 2016 14:05:04 EST Ventricular Rate:  60 PR Interval:  164 QRS Duration: 70 QT Interval:  434 QTC Calculation: 434 R Axis:   72 Text Interpretation:  Normal sinus rhythm Normal ECG since last tracing no significant change Confirmed by Eulis Foster  MD, Kameryn Tisdel [phone]) on 03/23/2016 5:28:53 PM       Radiology Dg Chest 2 View  Result Date: 03/23/2016 CLINICAL DATA:  Weakness and shortness of breath. History of right breast cancer. EXAM: CHEST  2 VIEW COMPARISON:  01/15/2016 and 09/22/2014 FINDINGS: Patchy densities near the right lung apex probably represent areas of scarring. This could represent post treatment changes. Otherwise, the lungs are clear. Heart and mediastinum are stable. No pleural effusions. Posterior lumbar  interbody fusion in the lower lumbar spine. Trachea is midline. Surgical clips in the right breast/axilla. IMPRESSION: No active cardiopulmonary disease. Evidence for scarring in the  right lung apex region. Electronically Signed   By: Markus Daft M.D.   On: 03/23/2016 14:30    Procedures Procedures (including critical care time)  Medications Ordered in ED Medications - No data to display   Initial Impression / Assessment and Plan / ED Course  I have reviewed the triage vital signs and the nursing notes.  Pertinent labs & imaging results that were available during my care of the patient were reviewed by me and considered in my medical decision making (see chart for details).  Clinical Course     Medications - No data to display  Patient Vitals for the past 24 hrs:  BP Temp Temp src Pulse Resp SpO2  03/23/16 1830 104/67 - - 66 17 98 %  03/23/16 1745 124/58 - - (!) 59 22 99 %  03/23/16 1730 (!) 107/50 - - (!) 52 12 97 %  03/23/16 1701 - - - (!) 53 16 100 %  03/23/16 1700 110/71 - - (!) 55 16 100 %  03/23/16 1651 115/63 - - - - -  03/23/16 1407 106/75 98.6 F (37 C) Oral (!) 57 17 100 %    7:01 PM Reevaluation with update and discussion. After initial assessment and treatment, an updated evaluation reveals She remains comfortable. She has been in to ambulate, eat and drink and has no further complaints. Findings discussed with patient and husband, all questions answered. Donne Baley L    Final Clinical Impressions(s) / ED Diagnoses   Final diagnoses:  Fall, initial encounter  Contusion of knee, unspecified laterality, initial encounter   Fall, without serious injury. Borderline low heart rate, and blood pressure. She is not orthostatic. Doubt serous bacterial infection. Metabolic instability or impending vascular collapse. She may benefit from withdrawal of metoprolol at this time. I do not want to change that treatment, because it was started by her cardiologist.  Nursing  Notes Reviewed/ Care Coordinated Applicable Imaging Reviewed Interpretation of Laboratory Data incorporated into ED treatment  The patient appears reasonably screened and/or stabilized for discharge and I doubt any other medical condition or other [facility] requiring further screening, evaluation, or treatment in the ED at this time prior to discharge.  Plan: Home Medications- continue; Home Treatments- rest; return here if the recommended treatment, does not improve the symptoms; Recommended follow up- call cardiology in the morning for recommendation of continuation of metoprolol, versus other interventions. Return to cardiac rehabilitation, next week.   New Prescriptions New Prescriptions   No medications on file     Daleen Bo, MD 03/23/16 1905

## 2016-03-23 NOTE — ED Triage Notes (Signed)
Pt was at cardiac rehab and started to feel dizzy prior to any activities. Pt fell and hit right knee- bruising noted to area per cardiac rehab nurse, did not lose consciousness. Pt has had intermittent CP since last night on right side.

## 2016-03-23 NOTE — Discharge Instructions (Signed)
Be careful when you walk, and sit down if you feel dizzy.  Call your cardiologist in the morning to ask if you should remain on the metoprolol.  It should be okay to return to cardiac rehabilitation on Monday, unless your cardiologist tells you not to.

## 2016-03-23 NOTE — ED Notes (Signed)
Provided Pt w/ a Coke and some graham crackers. Tolerated well.

## 2016-03-23 NOTE — Progress Notes (Signed)
Pt fell upon arrival to cardiac rehab.  Pt reports she felt dizzy and lightheaded which caused the fall. Pt c/o pain to right knee after fall.   Small area of bruising and swelling present.  BP:  130/70, telemetry-sinus rhythm HR-60.  Pt and witnesses deny pt hitting head or neck.  Pt assisted to wheelchair by staff.  12 lead EKG obtained.   Pt  reports she fell at home 2 days ago and felt dizzy at that time also.  Pt c/o headache today and chest discomfort, around right breast and neck associated with indigestion that began last night.  Off and on.  Unresponsive to Tums.  Pt currently chest pain free.   Pt transported to ED via stretcher due to headache and  knee pain, in stable condition.   Pt spouse made aware of pt transfer.

## 2016-03-23 NOTE — Progress Notes (Signed)
Cardiac Individual Treatment Plan  Patient Details  Name: Angela Bond MRN: [id number] Date of Birth: 09-30-1945 Referring Provider:   Flowsheet Row CARDIAC REHAB PHASE II ORIENTATION from 12/17/2015 in [facility]  Referring Provider  Candee Furbish, MD      Initial Encounter Date:  [place] PHASE II ORIENTATION from 12/17/2015 in [facility]  Date  12/17/15  Referring Provider  Candee Furbish, MD      Visit Diagnosis: 11/05/15 NSTEMI (non-ST elevated myocardial infarction) ([place])  11/05/15 Stented coronary artery  Patient's Home Medications on Admission:  Current Outpatient Prescriptions:  .  aspirin EC 81 MG EC tablet, Take 1 tablet (81 mg total) by mouth daily., Disp: , Rfl:  .  calcium carbonate (OS-CAL) 600 MG TABS, Take 600 mg by mouth 2 (two) times daily with a meal., Disp: , Rfl:  .  carboxymethylcellulose (REFRESH PLUS) 0.5 % SOLN, Place 1 drop into both eyes QID. , Disp: , Rfl:  .  cephALEXin (KEFLEX) 250 MG capsule, Take 250 mg by mouth at bedtime., Disp: , Rfl:  .  cephALEXin (KEFLEX) 500 MG capsule, Take 1 capsule (500 mg total) by mouth 2 (two) times daily., Disp: 20 capsule, Rfl: 0 .  Cholecalciferol (VITAMIN D) 2000 UNITS tablet, Take 2,000 Units by mouth 2 (two) times daily. , Disp: , Rfl:  .  clopidogrel (PLAVIX) 75 MG tablet, Take 1 tablet (75 mg total) by mouth daily., Disp: 90 tablet, Rfl: 3 .  dexlansoprazole (DEXILANT) 60 MG capsule, Take 60 mg by mouth daily., Disp: , Rfl:  .  escitalopram (LEXAPRO) 20 MG tablet, Take 20 mg by mouth daily., Disp: , Rfl:  .  fexofenadine (ALLEGRA) 180 MG tablet, Take 180 mg by mouth daily., Disp: , Rfl:  .  gabapentin (NEURONTIN) 300 MG capsule, Take 1 capsule by mouth 3  times daily, Disp: 270 capsule, Rfl: 1 .  LORazepam (ATIVAN) 1 MG tablet, Take 1 mg by mouth at bedtime. For sleeping per patient, Disp: , Rfl:  .  lovastatin (MEVACOR) 40 MG tablet,  Take 40 mg by mouth at bedtime. , Disp: , Rfl:  .  metoprolol succinate (TOPROL-XL) 25 MG 24 hr tablet, Take 0.5 tablets (12.5 mg total) by mouth at bedtime., Disp: 30 tablet, Rfl: 3 .  nitroGLYCERIN (NITROSTAT) 0.4 MG SL tablet, Place 1 tablet (0.4 mg total) under the tongue every 5 (five) minutes as needed for chest pain (CP or SOB)., Disp: 25 tablet, Rfl: 2 .  sodium chloride (OCEAN) 0.65 % SOLN nasal spray, Place 2 sprays into both nostrils as needed for congestion., Disp: , Rfl:  .  meclizine (ANTIVERT) 25 MG tablet, Take 12.5-25 mg by mouth 3 (three) times daily as needed for dizziness., Disp: , Rfl:   Past Medical History: Past Medical History:  Diagnosis Date  . Anemia    hx  . Anxiety and depression   . Arthritis    "~ all over my body; in the joints"  . Breast cancer, right ([place]) 3/16  . Coronary artery disease    a.  s/p NSTEMI in June 2017 with DES to mid LAD.  [place] Depression   . GERD (gastroesophageal reflux disease)   . H/O hiatal hernia   . Heart murmur   . History of blood transfusion    "when taking chemo & w/one of my knee replacements" (11/05/2015)  . Hypercholesteremia   . Hypertension   . Orthostatic hypotension   .  Pneumonia X 1  . Shingles 5/15   hx  . Shortness of breath    "stomach is in lungs" when had hernia 2014 none now(07/28/14)  . Sinus headache     Tobacco Use: History  Smoking Status  . Never Smoker  Smokeless Tobacco  . Never Used    Labs: Recent Review Flowsheet Data    Labs for ITP Cardiac and Pulmonary Rehab Latest Ref Rng & Units 11/04/2006 09/20/2013 09/21/2013 11/05/2015 11/06/2015   Cholestrol 0 - 200 mg/dL - - 166 - 158   LDLCALC 0 - 99 mg/dL - - 90 - 88   HDL >40 mg/dL - - 55 - 47   Trlycerides <150 mg/dL - - 107 - 113   Hemoglobin A1c 4.8 - 5.6 % - 5.6 - 5.5 -   HCO3 - 30.1(H) - - - -   TCO2 0 - 100 mmol/L 31 28 - - -      Capillary Blood Glucose: Lab Results  Component Value Date   GLUCAP 176 (H) 09/20/2013   GLUCAP 99  09/20/2013     Exercise Target Goals:    Exercise Program Goal: Individual exercise prescription set with THRR, safety & activity barriers. Participant demonstrates ability to understand and report RPE using BORG scale, to self-measure pulse accurately, and to acknowledge the importance of the exercise prescription.  Exercise Prescription Goal: Starting with aerobic activity 30 plus minutes a day, 3 days per week for initial exercise prescription. Provide home exercise prescription and guidelines that participant acknowledges understanding prior to discharge.  Activity Barriers & Risk Stratification:     Activity Barriers & Cardiac Risk Stratification - 12/17/15 0823      Activity Barriers & Cardiac Risk Stratification   Activity Barriers History of Falls;Back Problems;Other (comment);Shortness of Breath;Assistive Device;Joint Problems   Comments 2 fusion in lower back, broken R thumb and neck pain   Cardiac Risk Stratification High      6 Minute Walk:     6 Minute Walk    Row Name 12/17/15 1553         6 Minute Walk   Phase Initial     Distance 1208 feet     Walk Time 6 minutes     # of Rest Breaks 0     MPH 2.28     METS 2.2     RPE 11     VO2 Peak 7.7     Symptoms No     Resting HR 60 bpm     Resting BP 117/84     Max Ex. HR 92 bpm     Max Ex. BP 129/85     2 Minute Post BP 118/80        Initial Exercise Prescription:     Initial Exercise Prescription - 12/17/15 1500      Date of Initial Exercise RX and Referring Provider   Date 12/17/15   Referring Provider Candee Furbish, MD     Treadmill   MPH 1.7   Grade 0   Minutes 10   METs 2.3     NuStep   Level 2   Minutes 10   METs 1.5     Arm Ergometer   Level 1   Minutes 10   METs 1     Prescription Details   Frequency (times per week) 3   Duration Progress to 30 minutes of continuous aerobic without signs/symptoms of physical distress     Intensity   THRR 40-80% of  Max Heartrate 60-119    Ratings of Perceived Exertion 11-13   Perceived Dyspnea 0-4     Progression   Progression Continue to progress workloads to maintain intensity without signs/symptoms of physical distress.     Resistance Training   Training Prescription Yes   Weight 1lb   Reps 10-12      Perform Capillary Blood Glucose checks as needed.  Exercise Prescription Changes:     Exercise Prescription Changes    Row Name 01/07/16 1300 01/25/16 1700 02/22/16 1600 03/22/16 1100       Exercise Review   Progression Yes Yes Yes Yes      Response to Exercise   Blood Pressure (Admit) 110/72 112/82 112/80 126/80    Blood Pressure (Exercise) 124/80 132/70 125/88 140/80    Blood Pressure (Exit) 120/78 104/60 116/79 100/64    Heart Rate (Admit) 67 bpm 77 bpm 75 bpm 75 bpm    Heart Rate (Exercise) 103 bpm 103 bpm 95 bpm 105 bpm    Heart Rate (Exit) 67 bpm 72 bpm 75 bpm 70 bpm    Rating of Perceived Exertion (Exercise) '13 12 14 12    ' Symptoms  -  -  - none    Comments  - Reviewed HEP on 01/01/16 Reviewed HEP on 01/01/16 Reviewed HEP on 01/01/16    Duration Progress to 30 minutes of continuous aerobic without signs/symptoms of physical distress Progress to 30 minutes of continuous aerobic without signs/symptoms of physical distress Progress to 30 minutes of continuous aerobic without signs/symptoms of physical distress Progress to 30 minutes of continuous aerobic without signs/symptoms of physical distress    Intensity THRR unchanged THRR unchanged THRR unchanged THRR unchanged      Progression   Progression Continue to progress workloads to maintain intensity without signs/symptoms of physical distress. Continue to progress workloads to maintain intensity without signs/symptoms of physical distress. Continue to progress workloads to maintain intensity without signs/symptoms of physical distress. Continue to progress workloads to maintain intensity without signs/symptoms of physical distress.    Average METs 2.2 3.1  2.7 3.3      Resistance Training   Training Prescription Yes Yes Yes Yes    Weight 1lb 2lbs 2lbs 2lbs    Reps 10-12 10-12 10-12 10-12      NuStep   Level '2 4 5 5    ' Minutes '10 10 10 15    ' METs 1.6 3.6 3 3.9      Arm Ergometer   Level '1 1 1 1    ' Minutes '10 10 10 15    ' METs 1 1 2.74 2.74      Track   Laps '9 10 6  ' -    Minutes '10 10 10  ' -    METs 2.39 2.74 2.03  pt not feeling wel;,  encrged drinking H2O/  pt felt better -  pt not feeling wel;,  encrged drinking H2O/  pt felt better      Home Exercise Plan   Plans to continue exercise at Home  Reviewed on 01/01/16 see progress note Home  Reviewed on 01/01/16 see progress note Home  Reviewed on 01/01/16 see progress note Home  Reviewed on 01/01/16 see progress note    Frequency Add 2 additional days to program exercise sessions. Add 2 additional days to program exercise sessions. Add 2 additional days to program exercise sessions. Add 2 additional days to program exercise sessions.       Exercise Comments:     Exercise  Comments    Row Name 12/25/15 1119 01/07/16 1305 01/25/16 1712 02/22/16 1654 03/22/16 1133   Exercise Comments There are no changes to current Ex Rx Reviewed MET's. Pt is tolerating exercise well; will continue to monitor exercise progression. Reviewed MET's and goals. Pt is tolerating exercise well; will continue to monitor exercise progression. Reviewed MET's and goals. Pt is tolerating exercise well; will continue to monitor exercise progression. Reviewed MET's and goals. Pt is tolerating exercise well; will continue to monitor exercise progression.      Discharge Exercise Prescription (Final Exercise Prescription Changes):     Exercise Prescription Changes - 03/22/16 1100      Exercise Review   Progression Yes     Response to Exercise   Blood Pressure (Admit) 126/80   Blood Pressure (Exercise) 140/80   Blood Pressure (Exit) 100/64   Heart Rate (Admit) 75 bpm   Heart Rate (Exercise) 105 bpm   Heart  Rate (Exit) 70 bpm   Rating of Perceived Exertion (Exercise) 12   Symptoms none   Comments Reviewed HEP on 01/01/16   Duration Progress to 30 minutes of continuous aerobic without signs/symptoms of physical distress   Intensity THRR unchanged     Progression   Progression Continue to progress workloads to maintain intensity without signs/symptoms of physical distress.   Average METs 3.3     Resistance Training   Training Prescription Yes   Weight 2lbs   Reps 10-12     NuStep   Level 5   Minutes 15   METs 3.9     Arm Ergometer   Level 1   Minutes 15   METs 2.74     Track   METs --  pt not feeling wel;,  encrged drinking H2O/  pt felt better     Home Exercise Plan   Plans to continue exercise at Home  Reviewed on 01/01/16 see progress note   Frequency Add 2 additional days to program exercise sessions.      Nutrition:  Target Goals: Understanding of nutrition guidelines, daily intake of sodium <1516m, cholesterol <2065m calories 30% from fat and 7% or less from saturated fats, daily to have 5 or more servings of fruits and vegetables.  Biometrics:     Pre Biometrics - 12/17/15 1551      Pre Biometrics   Waist Circumference 36 inches   Hip Circumference 43.5 inches   Waist to Hip Ratio 0.83 %   Triceps Skinfold 44 mm   % Body Fat 45.6 %   Grip Strength 21 kg   Flexibility 11.5 in   Single Leg Stand 3.75 seconds       Nutrition Therapy Plan and Nutrition Goals:     Nutrition Therapy & Goals - 12/18/15 1347      Nutrition Therapy   Diet Therapeutic Lifestyle Changes     Personal Nutrition Goals   Personal Goal #1 1-2 lb wt loss/week to a goal wt loss of 6-24 lb [facility] regarding individualized specific dietary modifications aiming towards targeted core components such as weight, hypertension, lipid management, diabetes, heart failure and other comorbidities.    Expected Outcomes Short Term Goal: Understand basic principles of dietary content, such as calories, fat, sodium, cholesterol and nutrients.;Long Term Goal: Adherence to prescribed nutrition plan.      Nutrition Discharge: Nutrition Scores:     Nutrition Assessments - 01/01/16 1437  MEDFICTS Scores   Pre Score 24      Nutrition Goals Re-Evaluation:   Psychosocial: Target Goals: Acknowledge presence or absence of depression, maximize coping skills, provide positive support system. Participant is able to verbalize types and ability to use techniques and skills needed for reducing stress and depression.  Initial Review & Psychosocial Screening:     Initial Psych Review & Screening - 12/21/15 1637      Initial Review   Current issues with History of Depression     Family Dynamics   Good Support System? Yes   Comments strong faith base      Barriers   Psychosocial barriers to participate in program The patient should benefit from training in stress management and relaxation.     Screening Interventions   Interventions Encouraged to exercise      Quality of Life Scores:     Quality of Life - 02/10/16 1705      Quality of Life Scores   GLOBAL Pre --  pt low qol scores mostly indicative of her health related anxiety  and depression. pt has started antidepressant per PCP and states relief of symptoms. pt enjoys adult coloring and spending time with family.       PHQ-9: Recent Review Flowsheet Data    Depression screen [facility] 2/9 12/21/2015 04/15/2015 02/03/2015 01/07/2015   Decreased Interest 0 0 0 1   Down, Depressed, Hopeless 1 0 0 1   PHQ - 2 Score 1 0 0 2   Altered sleeping - 0 - 1   Tired, decreased energy - 0 - 1   Change in appetite - 0 - 1   Feeling bad or failure about yourself  - 0 - 0   Trouble concentrating - 0 - 0   Moving slowly or fidgety/restless - 0 - 0   Suicidal thoughts - 0  - 0   PHQ-9 Score - 0 - 5   Difficult doing work/chores - Not  difficult at all - Not difficult at all      Psychosocial Evaluation and Intervention:     Psychosocial Evaluation - 01/29/16 1624      Psychosocial Evaluation & Interventions   Interventions Stress management education;Relaxation education;Encouraged to exercise with the program and follow exercise prescription   Comments pt does have history of depression, which is better managed with recent medication adjustment per PCP.  pt mood is more light and upbeat.  pt admits she feels much better and is very grateful for the improvement.     Continued Psychosocial Services Needed Yes      Psychosocial Re-Evaluation:     Psychosocial Re-Evaluation    Row Name 02/24/16 1700 02/24/16 1703 03/22/16 1013         Psychosocial Re-Evaluation   Interventions Stress management education;Relaxation education;Encouraged to attend Cardiac Rehabilitation for the exercise  - Encouraged to attend Cardiac Rehabilitation for the exercise     Comments pt mood appears lighter,more relaxed. pt interacts appropriately with her peers and staff in group exercise setting. pt is very happy her husbands recent tests were negative for cancer. pt demonstrates decreased depression, increased smiles and laughter, and tolerates increased exercise workloads.   - no psychosocial needs identified, no inteventions necessary      Continued Psychosocial Services Needed No -  pt depression is being managed by her PCP No        Vocational Rehabilitation: Provide vocational rehab assistance to qualifying candidates.   Vocational Rehab Evaluation & Intervention:  Vocational Rehab - 12/17/15 1802      Initial Vocational Rehab Evaluation & Intervention   Assessment shows need for Vocational Rehabilitation No  Pt is retired and does not plan to return to [employer].      Education: Education Goals: Education classes will be provided on a weekly basis, covering required topics. Participant will state  understanding/return demonstration of topics presented.  Learning Barriers/Preferences:     Learning Barriers/Preferences - 12/17/15 0825      Learning Barriers/Preferences   Learning Barriers Sight   Learning Preferences Skilled Demonstration;Written Material;Pictoral;Video      Education Topics: Count Your Pulse:  -Group instruction provided by verbal instruction, demonstration, patient participation and written materials to support subject.  Instructors address importance of being able to find your pulse and how to count your pulse when at home without a heart monitor.  Patients get hands on experience counting their pulse with staff help and individually.   Heart Attack, Angina, and Risk Factor Modification:  -Group instruction provided by verbal instruction, video, and written materials to support subject.  Instructors address signs and symptoms of angina and heart attacks.    Also discuss risk factors for heart disease and how to make changes to improve heart health risk factors. Flowsheet Row CARDIAC REHAB PHASE II EXERCISE from 02/03/2016 in [facility]  Date  01/06/16  Educator  RN  Instruction Review Code  2- meets goals/outcomes      Functional Fitness:  -Group instruction provided by verbal instruction, demonstration, patient participation, and written materials to support subject.  Instructors address safety measures for doing things around the house.  Discuss how to get up and down off the floor, how to pick things up properly, how to safely get out of a chair without assistance, and balance training. Flowsheet Row CARDIAC REHAB PHASE II EXERCISE from 02/03/2016 in [facility]  Date  01/29/16  Instruction Review Code  2- meets goals/outcomes      Meditation and Mindfulness:  -Group instruction provided by verbal instruction, patient participation, and written materials to support subject.  Instructor  addresses importance of mindfulness and meditation practice to help reduce stress and improve awareness.  Instructor also leads participants through a meditation exercise.    Stretching for Flexibility and Mobility:  -Group instruction provided by verbal instruction, patient participation, and written materials to support subject.  Instructors lead participants through series of stretches that are designed to increase flexibility thus improving mobility.  These stretches are additional exercise for major muscle groups that are typically performed during regular warm up and cool down.   Hands Only CPR Anytime:  -Group instruction provided by verbal instruction, video, patient participation and written materials to support subject.  Instructors co-teach with AHA video for hands only CPR.  Participants get hands on experience with mannequins.   Nutrition I class: Heart Healthy Eating:  -Group instruction provided by PowerPoint slides, verbal discussion, and written materials to support subject matter. The instructor gives an explanation and review of the Therapeutic Lifestyle Changes diet recommendations, which includes a discussion on lipid goals, dietary fat, sodium, fiber, plant stanol/sterol esters, sugar, and the components of a well-balanced, healthy diet. Flowsheet Row CARDIAC REHAB PHASE II EXERCISE from 03/23/2016 in [facility]  Instruction Review Code  -- [class handouts given]      Nutrition II class: Lifestyle Skills:  -Group instruction provided by PowerPoint slides, verbal discussion, and written materials to  support subject matter. The instructor gives an explanation and review of label reading, grocery shopping for heart health, heart healthy recipe modifications, and ways to make healthier choices when eating out. Flowsheet Row CARDIAC REHAB PHASE II EXERCISE from 03/23/2016 in [facility]  Instruction Review Code  --  [class handouts given]      Diabetes Question & Answer:  -Group instruction provided by PowerPoint slides, verbal discussion, and written materials to support subject matter. The instructor gives an explanation and review of diabetes co-morbidities, pre- and post-prandial blood glucose goals, pre-exercise blood glucose goals, signs, symptoms, and treatment of hypoglycemia and hyperglycemia, and foot care basics. Flowsheet Row CARDIAC REHAB PHASE II EXERCISE from 02/03/2016 in [facility]  Date  12/25/15  Educator  RD  Instruction Review Code  2- meets goals/outcomes      Diabetes Blitz:  -Group instruction provided by PowerPoint slides, verbal discussion, and written materials to support subject matter. The instructor gives an explanation and review of the physiology behind type 1 and type 2 diabetes, diabetes medications and rational behind using different medications, pre- and post-prandial blood glucose recommendations and Hemoglobin A1c goals, diabetes diet, and exercise including blood glucose guidelines for exercising safely.    Portion Distortion:  -Group instruction provided by PowerPoint slides, verbal discussion, written materials, and food models to support subject matter. The instructor gives an explanation of serving size versus portion size, changes in portions sizes over the last 20 years, and what consists of a serving from each food group. Flowsheet Row CARDIAC REHAB PHASE II EXERCISE from 02/03/2016 in [facility]  Date  12/23/15  Educator  RD  Instruction Review Code  2- meets goals/outcomes      Stress Management:  -Group instruction provided by verbal instruction, video, and written materials to support subject matter.  Instructors review role of stress in heart disease and how to cope with stress positively.     Exercising on Your Own:  -Group instruction provided by verbal instruction, power point, and  written materials to support subject.  Instructors discuss benefits of exercise, components of exercise, frequency and intensity of exercise, and end points for exercise.  Also discuss use of nitroglycerin and activating EMS.  Review options of places to exercise outside of rehab.  Review guidelines for sex with heart disease. Flowsheet Row CARDIAC REHAB PHASE II EXERCISE from 02/03/2016 in [facility]  Date  02/03/16  Instruction Review Code  2- meets goals/outcomes      Cardiac Drugs I:  -Group instruction provided by verbal instruction and written materials to support subject.  Instructor reviews cardiac drug classes: antiplatelets, anticoagulants, beta blockers, and statins.  Instructor discusses reasons, side effects, and lifestyle considerations for each drug class. Flowsheet Row CARDIAC REHAB PHASE II EXERCISE from 02/03/2016 in [facility]  Date  12/30/15  Educator  pharmD  Instruction Review Code  2- meets goals/outcomes      Cardiac Drugs II:  -Group instruction provided by verbal instruction and written materials to support subject.  Instructor reviews cardiac drug classes: angiotensin converting enzyme inhibitors (ACE-I), angiotensin II receptor blockers (ARBs), nitrates, and calcium channel blockers.  Instructor discusses reasons, side effects, and lifestyle considerations for each drug class. Flowsheet Row CARDIAC REHAB PHASE II EXERCISE from 02/03/2016 in [facility]  Date  01/27/16  Educator  Pharm D  Instruction Review Code  2-  meets goals/outcomes      Anatomy and Physiology of the Circulatory System:  -Group instruction provided by verbal instruction, video, and written materials to support subject.  Reviews functional anatomy of heart, how it relates to various diagnoses, and what role the heart plays in the overall system. Flowsheet Row CARDIAC REHAB PHASE II EXERCISE from  02/03/2016 in [facility]  Date  01/20/16  Instruction Review Code  2- meets goals/outcomes      Knowledge Questionnaire Score:     Knowledge Questionnaire Score - 12/17/15 1536      Knowledge Questionnaire Score   Pre Score 21/24      Core Components/Risk Factors/Patient Goals at Admission:     Personal Goals and Risk Factors at Admission - 12/17/15 0828      Core Components/Risk Factors/Patient Goals on Admission    Weight Management Yes;Obesity   Intervention Weight Management: Develop a combined nutrition and exercise program designed to reach desired caloric intake, while maintaining appropriate intake of nutrient and fiber, sodium and fats, and appropriate energy expenditure required for the weight goal.;Weight Management: Provide education and appropriate resources to help participant work on and attain dietary goals.;Weight Management/Obesity: Establish reasonable short term and long term weight goals.;Obesity: Provide education and appropriate resources to help participant work on and attain dietary goals.   Expected Outcomes Short Term: Continue to assess and modify interventions until short term weight is achieved;Weight Maintenance: Understanding of the daily nutrition guidelines, which includes 25-35% calories from fat, 7% or less cal from saturated fats, less than 221m cholesterol, less than 1.5gm of sodium, & 5 or more servings of fruits and vegetables daily;Weight Loss: Understanding of general recommendations for a balanced deficit meal plan, which promotes 1-2 lb weight loss per week and includes a negative energy balance of [phone] kcal/d;Understanding recommendations for meals to include 15-35% energy as protein, 25-35% energy from fat, 35-60% energy from carbohydrates, less than 2035mof dietary cholesterol, 20-35 gm of total fiber daily;Understanding of distribution of calorie intake throughout the day with the consumption of 4-5  meals/snacks   Increase Strength and Stamina Yes   Intervention Provide advice, education, support and counseling about physical activity/exercise needs.;Develop an individualized exercise prescription for aerobic and resistive training based on initial evaluation findings, risk stratification, comorbidities and participant's personal goals.   Expected Outcomes Achievement of increased cardiorespiratory fitness and enhanced flexibility, muscular endurance and strength shown through measurements of functional capacity and personal statement of participant.   Improve shortness of breath with ADL's Yes   Intervention Provide education, individualized exercise plan and daily activity instruction to help decrease symptoms of SOB with activities of daily living.   Expected Outcomes Short Term: Achieves a reduction of symptoms when performing activities of daily living.   Hypertension Yes   Intervention Provide education on lifestyle modifcations including regular physical activity/exercise, weight management, moderate sodium restriction and increased consumption of fresh fruit, vegetables, and low fat dairy, alcohol moderation, and smoking cessation.;Monitor prescription use compliance.   Expected Outcomes Short Term: Continued assessment and intervention until BP is < 140/9058mG in hypertensive participants. < 130/24m8m in hypertensive participants with diabetes, heart failure or chronic kidney disease.;Long Term: Maintenance of blood pressure at goal levels.   Lipids Yes   Intervention Provide education and support for participant on nutrition & aerobic/resistive exercise along with prescribed medications to achieve LDL <70mg21mL >40mg.13mxpected Outcomes Short Term: Participant states understanding of desired cholesterol values and is compliant  with medications prescribed. Participant is following exercise prescription and nutrition guidelines.;Long Term: Cholesterol controlled with medications as  prescribed, with individualized exercise RX and with personalized nutrition plan. Value goals: LDL < 2m, HDL > 40 mg.   Personal Goal Other Yes   Personal Goal short: Be able to do more activities with less fatigue  Long: Be able to climb stairs with less SOB and lose 20lbs   Intervention Provide nutrition and education counseling to assist with increaseing exercise capacity, losing weight and increasing energy levels   Expected Outcomes Pt will be able to lose wt, do more activities and be able to climb stairs without difficulties      Core Components/Risk Factors/Patient Goals Review:      Goals and Risk Factor Review    Row Name 01/25/16 1712 02/24/16 1716 03/22/16 1133         Core Components/Risk Factors/Patient Goals Review   Personal Goals Review Other Improve shortness of breath with ADL's Other;Weight Management/Obesity     Review Pt is doing well with ADL's and steady improving exercise tolerance Pt is able to climb stairs with less fatigue and SOB. Pt states " it easier to climb stairs" Pt weight is fluctuating but overall feeling great and stamina is improving     Expected Outcomes Pt will continue to improve in cardiovascular fitness and be able to do household chores without difficulty. Pt will continue to improve in cardiovascular fitness and be able to perform functional activites without difficulty Pt will continue to improve in cardiovascular fitness and be able to perform functional activites without difficulty        Core Components/Risk Factors/Patient Goals at Discharge (Final Review):      Goals and Risk Factor Review - 03/22/16 1133      Core Components/Risk Factors/Patient Goals Review   Personal Goals Review Other;Weight Management/Obesity   Review Pt weight is fluctuating but overall feeling great and stamina is improving   Expected Outcomes Pt will continue to improve in cardiovascular fitness and be able to perform functional activites without difficulty       ITP Comments:     ITP Comments    Row Name 12/17/15 0819 02/19/16 1443         ITP Comments Dr. TFransico Him Medical Director attended Hypertension education lecture         Comments: Pt is making expected progress toward personal goals after completing 33 sessions. Recommend continued exercise and life style modification education including  stress management and relaxation techniques to decrease cardiac risk profile.

## 2016-03-23 NOTE — ED Notes (Signed)
Pt has a HX of heart failure and fell 3 times this week. Once at rehab and twice at home. This last fall, was at the cardio rehab, and she fell on her right knee.  Pt says she did not hit her head. Pt says she was " feeling lightheaded and just started falling." Pt denies blacking out completely.

## 2016-03-23 NOTE — ED Notes (Signed)
PT Ambulated in hall with assistant. PT did well walking to next room door then sat in wheel chair back to her room.

## 2016-03-24 ENCOUNTER — Telehealth: Payer: Self-pay | Admitting: Cardiology

## 2016-03-24 NOTE — Telephone Encounter (Signed)
Pt calling to report she has been having some falls.  Last week she fell once and this week is has fallen twice.  She says she doesn't know why she falls just all the sudden I'm just down.  She denies "passing out."  She takes Metoprolol 12.5 mg at bedtime.  She feels as though she has been staying hydrated.  She states her dizziness does not feel like the vertigo she has had in the past which feels like the room is spinning.  She thinks maybe it's from getting up too fast or changing positions.  She was evaluated in the ED and was told to call Dr Marlou Porch to see if any medication changes need to occur and when she can go back to rehab.  She also reports her legs are black, blue and sore d/t the falls.  Advised I will review with MS and call her back.  She denies any s/s at this time.

## 2016-03-24 NOTE — Telephone Encounter (Signed)
New message    Patient fell at cardiac rehab on yesterday. Blood pressure a little low . Rehab wanted Dr. Marlou Porch to know.   Patient went to emergency room.   Pt c/o BP issue: STAT if pt c/o blurred vision, one-sided weakness or slurred speech  1. What are your last 5 BP readings? Today  97/53  2. Are you having any other symptoms (ex. Dizziness, headache, blurred vision, passed out)? Falling a little dizzy just fall.   3. What is your BP issue? Was advise to call back and speak with nurse. / when should she return back to cardiac rehab.

## 2016-03-25 ENCOUNTER — Encounter (HOSPITAL_COMMUNITY): Admission: RE | Admit: 2016-03-25 | Payer: Medicare Other | Source: Ambulatory Visit

## 2016-03-25 NOTE — Telephone Encounter (Signed)
Left message for pt to stop Metoprolol. Monitor for continued dizziness and also monitor BP.  As long as dizziness improves off Metoprolol she may return to cardiac rehab on Monday.  Requested she call back with any questions or concerns.

## 2016-03-25 NOTE — Telephone Encounter (Signed)
Stop metoprolol 12.5 mg. Candee Furbish, MD

## 2016-03-28 ENCOUNTER — Telehealth (HOSPITAL_COMMUNITY): Payer: Self-pay | Admitting: Cardiac Rehabilitation

## 2016-03-28 NOTE — Telephone Encounter (Signed)
States she did get my message last week.  She did d/c Metoprolol and is no longer having c/o dizziness now.  She states she is still bruised from falling and will probably try to return to rehab on Wednesday.

## 2016-03-28 NOTE — Telephone Encounter (Signed)
pc to pt to discuss returning to cardiac rehab. Lm on am.

## 2016-03-30 ENCOUNTER — Encounter (HOSPITAL_COMMUNITY)
Admission: RE | Admit: 2016-03-30 | Discharge: 2016-03-30 | Disposition: A | Discharge status: Home / Self Care | Payer: Medicare Other | Source: Ambulatory Visit | Attending: Cardiology | Admitting: Cardiology

## 2016-03-30 DIAGNOSIS — I214 Non-ST elevation (NSTEMI) myocardial infarction: Secondary | ICD-10-CM | POA: Diagnosis not present

## 2016-04-01 ENCOUNTER — Encounter (HOSPITAL_COMMUNITY)
Admission: RE | Admit: 2016-04-01 | Discharge: 2016-04-01 | Disposition: A | Discharge status: Home / Self Care | Payer: Medicare Other | Source: Ambulatory Visit | Attending: Cardiology | Admitting: Cardiology

## 2016-04-01 ENCOUNTER — Encounter (HOSPITAL_COMMUNITY): Payer: Self-pay

## 2016-04-01 VITALS — Ht 59.25 in | Wt 167.1 lb

## 2016-04-01 DIAGNOSIS — I214 Non-ST elevation (NSTEMI) myocardial infarction: Secondary | ICD-10-CM | POA: Diagnosis not present

## 2016-04-01 DIAGNOSIS — Z955 Presence of coronary angioplasty implant and graft: Secondary | ICD-10-CM

## 2016-04-01 NOTE — Progress Notes (Signed)
Cardiac Individual Treatment Plan  Patient Details  Name: Angela Bond MRN: [id number] Date of Birth: 01/14/1946 Referring Provider:   Flowsheet Row CARDIAC REHAB PHASE II ORIENTATION from 12/17/2015 in [place]  Referring Provider  Candee Furbish, MD      Initial Encounter Date:  [place] PHASE II ORIENTATION from 12/17/2015 in [place]  Date  12/17/15  Referring Provider  Candee Furbish, MD      Visit Diagnosis: 11/05/15 NSTEMI (non-ST elevated myocardial infarction) ([place])  11/05/15 Stented coronary artery  Patient's Home Medications on Admission:  Current Outpatient Prescriptions:  .  aspirin EC 81 MG EC tablet, Take 1 tablet (81 mg total) by mouth daily., Disp: , Rfl:  .  calcium carbonate (OS-CAL) 600 MG TABS, Take 600 mg by mouth 2 (two) times daily with a meal., Disp: , Rfl:  .  carboxymethylcellulose (REFRESH PLUS) 0.5 % SOLN, Place 1 drop into both eyes QID. , Disp: , Rfl:  .  cephALEXin (KEFLEX) 250 MG capsule, Take 250 mg by mouth at bedtime., Disp: , Rfl:  .  cephALEXin (KEFLEX) 500 MG capsule, Take 1 capsule (500 mg total) by mouth 2 (two) times daily., Disp: 20 capsule, Rfl: 0 .  Cholecalciferol (VITAMIN D) 2000 UNITS tablet, Take 2,000 Units by mouth 2 (two) times daily. , Disp: , Rfl:  .  clopidogrel (PLAVIX) 75 MG tablet, Take 1 tablet (75 mg total) by mouth daily., Disp: 90 tablet, Rfl: 3 .  dexlansoprazole (DEXILANT) 60 MG capsule, Take 60 mg by mouth daily., Disp: , Rfl:  .  escitalopram (LEXAPRO) 20 MG tablet, Take 20 mg by mouth daily., Disp: , Rfl:  .  fexofenadine (ALLEGRA) 180 MG tablet, Take 180 mg by mouth daily., Disp: , Rfl:  .  gabapentin (NEURONTIN) 300 MG capsule, Take 1 capsule by mouth 3  times daily, Disp: 270 capsule, Rfl: 1 .  LORazepam (ATIVAN) 1 MG tablet, Take 1 mg by mouth at bedtime. For sleeping per patient, Disp: , Rfl:  .  lovastatin (MEVACOR) 40 MG tablet,  Take 40 mg by mouth at bedtime. , Disp: , Rfl:  .  meclizine (ANTIVERT) 25 MG tablet, Take 12.5-25 mg by mouth 3 (three) times daily as needed for dizziness., Disp: , Rfl:  .  metoprolol succinate (TOPROL-XL) 25 MG 24 hr tablet, Take 0.5 tablets (12.5 mg total) by mouth at bedtime., Disp: 30 tablet, Rfl: 3 .  nitroGLYCERIN (NITROSTAT) 0.4 MG SL tablet, Place 1 tablet (0.4 mg total) under the tongue every 5 (five) minutes as needed for chest pain (CP or SOB)., Disp: 25 tablet, Rfl: 2 .  sodium chloride (OCEAN) 0.65 % SOLN nasal spray, Place 2 sprays into both nostrils as needed for congestion., Disp: , Rfl:   Past Medical History: Past Medical History:  Diagnosis Date  . Anemia    hx  . Anxiety and depression   . Arthritis    "~ all over my body; in the joints"  . Breast cancer, right ([place]) 3/16  . Coronary artery disease    a.  s/p NSTEMI in June 2017 with DES to mid LAD.  [place] Kitchen Depression   . GERD (gastroesophageal reflux disease)   . H/O hiatal hernia   . Heart murmur   . History of blood transfusion    "when taking chemo & w/one of my knee replacements" (11/05/2015)  . Hypercholesteremia   . Hypertension   . Orthostatic hypotension   .  Pneumonia X 1  . Shingles 5/15   hx  . Shortness of breath    "stomach is in lungs" when had hernia 2014 none now(07/28/14)  . Sinus headache     Tobacco Use: History  Smoking Status  . Never Smoker  Smokeless Tobacco  . Never Used    Labs: Recent Review Flowsheet Data    Labs for ITP Cardiac and Pulmonary Rehab Latest Ref Rng & Units 11/04/2006 09/20/2013 09/21/2013 11/05/2015 11/06/2015   Cholestrol 0 - 200 mg/dL - - 166 - 158   LDLCALC 0 - 99 mg/dL - - 90 - 88   HDL >40 mg/dL - - 55 - 47   Trlycerides <150 mg/dL - - 107 - 113   Hemoglobin A1c 4.8 - 5.6 % - 5.6 - 5.5 -   HCO3 - 30.1(H) - - - -   TCO2 0 - 100 mmol/L 31 28 - - -      Capillary Blood Glucose: Lab Results  Component Value Date   GLUCAP 176 (H) 09/20/2013   GLUCAP 99  09/20/2013     Exercise Target Goals:    Exercise Program Goal: Individual exercise prescription set with THRR, safety & activity barriers. Participant demonstrates ability to understand and report RPE using BORG scale, to self-measure pulse accurately, and to acknowledge the importance of the exercise prescription.  Exercise Prescription Goal: Starting with aerobic activity 30 plus minutes a day, 3 days per week for initial exercise prescription. Provide home exercise prescription and guidelines that participant acknowledges understanding prior to discharge.  Activity Barriers & Risk Stratification:     Activity Barriers & Cardiac Risk Stratification - 12/17/15 0823      Activity Barriers & Cardiac Risk Stratification   Activity Barriers History of Falls;Back Problems;Other (comment);Shortness of Breath;Assistive Device;Joint Problems   Comments 2 fusion in lower back, broken R thumb and neck pain   Cardiac Risk Stratification High      6 Minute Walk:     6 Minute Walk    Row Name 12/17/15 1553         6 Minute Walk   Phase Initial     Distance 1208 feet     Walk Time 6 minutes     # of Rest Breaks 0     MPH 2.28     METS 2.2     RPE 11     VO2 Peak 7.7     Symptoms No     Resting HR 60 bpm     Resting BP 117/84     Max Ex. HR 92 bpm     Max Ex. BP 129/85     2 Minute Post BP 118/80        Initial Exercise Prescription:     Initial Exercise Prescription - 12/17/15 1500      Date of Initial Exercise RX and Referring Provider   Date 12/17/15   Referring Provider Candee Furbish, MD     Treadmill   MPH 1.7   Grade 0   Minutes 10   METs 2.3     NuStep   Level 2   Minutes 10   METs 1.5     Arm Ergometer   Level 1   Minutes 10   METs 1     Prescription Details   Frequency (times per week) 3   Duration Progress to 30 minutes of continuous aerobic without signs/symptoms of physical distress     Intensity   THRR 40-80% of  Max Heartrate 60-119    Ratings of Perceived Exertion 11-13   Perceived Dyspnea 0-4     Progression   Progression Continue to progress workloads to maintain intensity without signs/symptoms of physical distress.     Resistance Training   Training Prescription Yes   Weight 1lb   Reps 10-12      Perform Capillary Blood Glucose checks as needed.  Exercise Prescription Changes:      Exercise Prescription Changes    Row Name 01/07/16 1300 01/25/16 1700 02/22/16 1600 03/22/16 1100 04/12/16 1200     Exercise Review   Progression Yes Yes Yes Yes Yes     Response to Exercise   Blood Pressure (Admit) 110/72 112/82 112/80 126/80 104/64   Blood Pressure (Exercise) 124/80 132/70 125/88 140/80 114/70   Blood Pressure (Exit) 120/78 104/60 116/79 100/64 118/80   Heart Rate (Admit) 67 bpm 77 bpm 75 bpm 75 bpm 87 bpm   Heart Rate (Exercise) 103 bpm 103 bpm 95 bpm 105 bpm 131 bpm   Heart Rate (Exit) 67 bpm 72 bpm 75 bpm 70 bpm 88 bpm   Rating of Perceived Exertion (Exercise) '13 12 14 12 11   ' Symptoms  -  -  - none none   Comments  - Reviewed HEP on 01/01/16 Reviewed HEP on 01/01/16 Reviewed HEP on 01/01/16 [facility] HEP on 8/18.17   Duration Progress to 30 minutes of continuous aerobic without signs/symptoms of physical distress Progress to 30 minutes of continuous aerobic without signs/symptoms of physical distress Progress to 30 minutes of continuous aerobic without signs/symptoms of physical distress Progress to 30 minutes of continuous aerobic without signs/symptoms of physical distress Progress to 30 minutes of continuous aerobic without signs/symptoms of physical distress   Intensity THRR unchanged THRR unchanged THRR unchanged THRR unchanged THRR unchanged     Progression   Progression Continue to progress workloads to maintain intensity without signs/symptoms of physical distress. Continue to progress workloads to maintain intensity without signs/symptoms of physical distress. Continue to progress workloads to  maintain intensity without signs/symptoms of physical distress. Continue to progress workloads to maintain intensity without signs/symptoms of physical distress. Continue to progress workloads to maintain intensity without signs/symptoms of physical distress.   Average METs 2.2 3.1 2.7 3.3 3.3     Resistance Training   Training Prescription Yes Yes Yes Yes Yes   Weight 1lb 2lbs 2lbs 2lbs 2lbs   Reps 10-12 10-12 10-12 10-12 10-12     NuStep   Level '2 4 5 5  ' -   Minutes '10 10 10 15  ' -   METs 1.6 3.6 3 3.9  -     Arm Ergometer   Level '1 1 1 1 1   ' Minutes '10 10 10 15 15   ' METs 1 1 2.74 2.74 2.39     Track   Laps '9 10 6  ' - 16   Minutes '10 10 10  ' - 15   METs 2.39 2.74 2.03  pt not feeling wel;,  encrged drinking H2O/  pt felt better -  pt not feeling wel;,  encrged drinking H2O/  pt felt better 3.79     Home Exercise Plan   Plans to continue exercise at Home  Reviewed on 01/01/16 see progress note Home  Reviewed on 01/01/16 see progress note Home  Reviewed on 01/01/16 see progress note Home  Reviewed on 01/01/16 see progress note Home  Reviewed on 01/01/16   Frequency Add 2 additional days to program exercise sessions.  Add 2 additional days to program exercise sessions. Add 2 additional days to program exercise sessions. Add 2 additional days to program exercise sessions. Add 3 additional days to program exercise sessions.      Exercise Comments:      Exercise Comments    Row Name 12/25/15 1119 01/07/16 1305 01/25/16 1712 02/22/16 1654 03/22/16 1133   Exercise Comments There are no changes to current Ex Rx Reviewed MET's. Pt is tolerating exercise well; will continue to monitor exercise progression. Reviewed MET's and goals. Pt is tolerating exercise well; will continue to monitor exercise progression. Reviewed MET's and goals. Pt is tolerating exercise well; will continue to monitor exercise progression. Reviewed MET's and goals. Pt is tolerating exercise well; will continue to  monitor exercise progression.   [place] Name 04/12/16 1228           Exercise Comments Pt completed 36 sessions of cardiac rehab and plans to continue exercise at [place] and walk 2-3x/week.          Discharge Exercise Prescription (Final Exercise Prescription Changes):     Exercise Prescription Changes - 04/12/16 1200      Exercise Review   Progression Yes     Response to Exercise   Blood Pressure (Admit) 104/64   Blood Pressure (Exercise) 114/70   Blood Pressure (Exit) 118/80   Heart Rate (Admit) 87 bpm   Heart Rate (Exercise) 131 bpm   Heart Rate (Exit) 88 bpm   Rating of Perceived Exertion (Exercise) 11   Symptoms none   Comments [facility] HEP on 8/18.17   Duration Progress to 30 minutes of continuous aerobic without signs/symptoms of physical distress   Intensity THRR unchanged     Progression   Progression Continue to progress workloads to maintain intensity without signs/symptoms of physical distress.   Average METs 3.3     Resistance Training   Training Prescription Yes   Weight 2lbs   Reps 10-12     Arm Ergometer   Level 1   Minutes 15   METs 2.39     Track   Laps 16   Minutes 15   METs 3.79     Home Exercise Plan   Plans to continue exercise at Home  Reviewed on 01/01/16   Frequency Add 3 additional days to program exercise sessions.      Nutrition:  Target Goals: Understanding of nutrition guidelines, daily intake of sodium <1542m, cholesterol <2073m calories 30% from fat and 7% or less from saturated fats, daily to have 5 or more servings of fruits and vegetables.  Biometrics:     Pre Biometrics - 12/17/15 1551      Pre Biometrics   Waist Circumference 36 inches   Hip Circumference 43.5 inches   Waist to Hip Ratio 0.83 %   Triceps Skinfold 44 mm   % Body Fat 45.6 %   Grip Strength 21 kg   Flexibility 11.5 in   Single Leg Stand 3.75 seconds         Post Biometrics - 04/12/16 1222       Post  Biometrics   Height 4'  11.25" (1.505 m)   Weight 167 lb 1.7 oz (75.8 kg)   Waist Circumference 38.5 inches   Hip Circumference 46.5 inches   Waist to Hip Ratio 0.83 %   BMI (Calculated) 33.5   Triceps Skinfold 41 mm   % Body Fat 46.6 %   Grip Strength 22.5 kg   Flexibility 14 in  Single Leg Stand 2.2 seconds      Nutrition Therapy Plan and Nutrition Goals:     Nutrition Therapy & Goals - 12/18/15 1347      Nutrition Therapy   Diet Therapeutic Lifestyle Changes     Personal Nutrition Goals   Personal Goal #1 1-2 lb wt loss/week to a goal wt loss of 6-24 lb at graduation from [facility], educate and counsel regarding individualized specific dietary modifications aiming towards targeted core components such as weight, hypertension, lipid management, diabetes, heart failure and other comorbidities.   Expected Outcomes Short Term Goal: Understand basic principles of dietary content, such as calories, fat, sodium, cholesterol and nutrients.;Long Term Goal: Adherence to prescribed nutrition plan.      Nutrition Discharge: Nutrition Scores:     Nutrition Assessments - 04/15/16 1125      MEDFICTS Scores   Pre Score 24   Post Score 21   Score Difference -3      Nutrition Goals Re-Evaluation:     Nutrition Goals Re-Evaluation    Row Name 04/15/16 1128             Personal Goal #1 Re-Evaluation   Personal Goal #1 1-2 lb wt loss/week to a goal wt loss of 6-24 lb at graduation from Cardiac Rehab       Goal Progress Seen No       Comments Pt gained 5.5 lb while in Cardiac Rehab         Weight   Current Weight 168 lb 4.8 oz (76.3 kg)          Psychosocial: Target Goals: Acknowledge presence or absence of depression, maximize coping skills, provide positive support system. Participant is able to verbalize types and ability to use techniques and skills needed for reducing stress and depression.  Initial Review & Psychosocial Screening:      Initial Psych Review & Screening - 12/21/15 1637      Initial Review   Current issues with History of Depression     Family Dynamics   Good Support System? Yes   Comments strong faith base      Barriers   Psychosocial barriers to participate in program The patient should benefit from training in stress management and relaxation.     Screening Interventions   Interventions Encouraged to exercise      Quality of Life Scores:     Quality of Life - 03/31/16 1634      Quality of Life Scores   Health/Function Pre 21.86 %   Health/Function Post 30 %   Health/Function % Change 37.24 %   Socioeconomic Pre 25.71 %   Socioeconomic Post 28.29 %   Socioeconomic % Change  10.04 %   Psych/Spiritual Pre 27.43 %   Psych/Spiritual Post 30 %   Psych/Spiritual % Change 9.37 %   Family Pre 27.6 %   Family Post 30 %   Family % Change 8.7 %   GLOBAL Post 30 %      PHQ-9: Recent Review Flowsheet Data    Depression screen [facility] 2/9 04/01/2016 12/21/2015 04/15/2015 02/03/2015 01/07/2015   Decreased Interest 0 0 0 0 1   Down, Depressed, Hopeless 0  1 0 0 1   PHQ - 2 Score 0 1 0 0 2   Altered sleeping - - 0 - 1   Tired, decreased energy - - 0 - 1   Change in appetite - - 0 -  1   Feeling bad or failure about yourself  - - 0 - 0   Trouble concentrating - - 0 - 0   Moving slowly or fidgety/restless - - 0 - 0   Suicidal thoughts - - 0  - 0   PHQ-9 Score - - 0 - 5   Difficult doing work/chores - - Not difficult at all - Not difficult at all      Psychosocial Evaluation and Intervention:     Psychosocial Evaluation - 04/01/16 1414      Psychosocial Evaluation & Interventions   Continued Psychosocial Services Needed No     Discharge Psychosocial Assessment & Intervention   Discharge Continue support measures as needed   Comments pt depression will continue to be managed by her PCP      Psychosocial Re-Evaluation:     Psychosocial Re-Evaluation    [place] Name 02/24/16 1700 02/24/16  1703 03/22/16 1013         Psychosocial Re-Evaluation   Interventions Stress management education;Relaxation education;Encouraged to attend Cardiac Rehabilitation for the exercise  - Encouraged to attend Cardiac Rehabilitation for the exercise     Comments pt mood appears lighter,more relaxed. pt interacts appropriately with her peers and staff in group exercise setting. pt is very happy her husbands recent tests were negative for cancer. pt demonstrates decreased depression, increased smiles and laughter, and tolerates increased exercise workloads.   - no psychosocial needs identified, no inteventions necessary      Continued Psychosocial Services Needed No -  pt depression is being managed by her PCP No        Vocational Rehabilitation: Provide vocational rehab assistance to qualifying candidates.   Vocational Rehab Evaluation & Intervention:     Vocational Rehab - 12/17/15 1802      Initial Vocational Rehab Evaluation & Intervention   Assessment shows need for Vocational Rehabilitation No  Pt is retired and does not plan to return to [employer].      Education: Education Goals: Education classes will be provided on a weekly basis, covering required topics. Participant will state understanding/return demonstration of topics presented.  Learning Barriers/Preferences:     Learning Barriers/Preferences - 12/17/15 0825      Learning Barriers/Preferences   Learning Barriers Sight   Learning Preferences Skilled Demonstration;Written Material;Pictoral;Video      Education Topics: Count Your Pulse:  -Group instruction provided by verbal instruction, demonstration, patient participation and written materials to support subject.  Instructors address importance of being able to find your pulse and how to count your pulse when at home without a heart monitor.  Patients get hands on experience counting their pulse with staff help and individually.   Heart Attack, Angina,  and Risk Factor Modification:  -Group instruction provided by verbal instruction, video, and written materials to support subject.  Instructors address signs and symptoms of angina and heart attacks.    Also discuss risk factors for heart disease and how to make changes to improve heart health risk factors. Flowsheet Row CARDIAC REHAB PHASE II EXERCISE from 02/03/2016 in [place]  Date  01/06/16  Educator  RN  Instruction Review Code  2- meets goals/outcomes      Functional Fitness:  -Group instruction provided by verbal instruction, demonstration, patient participation, and written materials to support subject.  Instructors address safety measures for doing things around the house.  Discuss how to get up and down off the floor, how to pick things up properly, how to  safely get out of a chair without assistance, and balance training. Flowsheet Row CARDIAC REHAB PHASE II EXERCISE from 02/03/2016 in [place]  Date  01/29/16  Instruction Review Code  2- meets goals/outcomes      Meditation and Mindfulness:  -Group instruction provided by verbal instruction, patient participation, and written materials to support subject.  Instructor addresses importance of mindfulness and meditation practice to help reduce stress and improve awareness.  Instructor also leads participants through a meditation exercise.    Stretching for Flexibility and Mobility:  -Group instruction provided by verbal instruction, patient participation, and written materials to support subject.  Instructors lead participants through series of stretches that are designed to increase flexibility thus improving mobility.  These stretches are additional exercise for major muscle groups that are typically performed during regular warm up and cool down.   Hands Only CPR Anytime:  -Group instruction provided by verbal instruction, video, patient participation and written  materials to support subject.  Instructors co-teach with AHA video for hands only CPR.  Participants get hands on experience with mannequins.   Nutrition I class: Heart Healthy Eating:  -Group instruction provided by PowerPoint slides, verbal discussion, and written materials to support subject matter. The instructor gives an explanation and review of the Therapeutic Lifestyle Changes diet recommendations, which includes a discussion on lipid goals, dietary fat, sodium, fiber, plant stanol/sterol esters, sugar, and the components of a well-balanced, healthy diet. Flowsheet Row CARDIAC REHAB PHASE II EXERCISE from 03/23/2016 in [place]  Instruction Review Code  -- [class handouts given]      Nutrition II class: Lifestyle Skills:  -Group instruction provided by PowerPoint slides, verbal discussion, and written materials to support subject matter. The instructor gives an explanation and review of label reading, grocery shopping for heart health, heart healthy recipe modifications, and ways to make healthier choices when eating out. Flowsheet Row CARDIAC REHAB PHASE II EXERCISE from 03/23/2016 in [place]  Instruction Review Code  -- [class handouts given]      Diabetes Question & Answer:  -Group instruction provided by PowerPoint slides, verbal discussion, and written materials to support subject matter. The instructor gives an explanation and review of diabetes co-morbidities, pre- and post-prandial blood glucose goals, pre-exercise blood glucose goals, signs, symptoms, and treatment of hypoglycemia and hyperglycemia, and foot care basics. Flowsheet Row CARDIAC REHAB PHASE II EXERCISE from 02/03/2016 in [place]  Date  12/25/15  Educator  RD  Instruction Review Code  2- meets goals/outcomes      Diabetes Blitz:  -Group instruction provided by PowerPoint slides, verbal discussion, and written  materials to support subject matter. The instructor gives an explanation and review of the physiology behind type 1 and type 2 diabetes, diabetes medications and rational behind using different medications, pre- and post-prandial blood glucose recommendations and Hemoglobin A1c goals, diabetes diet, and exercise including blood glucose guidelines for exercising safely.    Portion Distortion:  -Group instruction provided by PowerPoint slides, verbal discussion, written materials, and food models to support subject matter. The instructor gives an explanation of serving size versus portion size, changes in portions sizes over the last 20 years, and what consists of a serving from each food group. Flowsheet Row CARDIAC REHAB PHASE II EXERCISE from 02/03/2016 in [place]  Date  12/23/15  Educator  RD  Instruction Review Code  2- meets goals/outcomes  Stress Management:  -Group instruction provided by verbal instruction, video, and written materials to support subject matter.  Instructors review role of stress in heart disease and how to cope with stress positively.     Exercising on Your Own:  -Group instruction provided by verbal instruction, power point, and written materials to support subject.  Instructors discuss benefits of exercise, components of exercise, frequency and intensity of exercise, and end points for exercise.  Also discuss use of nitroglycerin and activating EMS.  Review options of places to exercise outside of rehab.  Review guidelines for sex with heart disease. Flowsheet Row CARDIAC REHAB PHASE II EXERCISE from 02/03/2016 in [place]  Date  02/03/16  Instruction Review Code  2- meets goals/outcomes      Cardiac Drugs I:  -Group instruction provided by verbal instruction and written materials to support subject.  Instructor reviews cardiac drug classes: antiplatelets, anticoagulants, beta blockers, and  statins.  Instructor discusses reasons, side effects, and lifestyle considerations for each drug class. Flowsheet Row CARDIAC REHAB PHASE II EXERCISE from 02/03/2016 in [place]  Date  12/30/15  Educator  pharmD  Instruction Review Code  2- meets goals/outcomes      Cardiac Drugs II:  -Group instruction provided by verbal instruction and written materials to support subject.  Instructor reviews cardiac drug classes: angiotensin converting enzyme inhibitors (ACE-I), angiotensin II receptor blockers (ARBs), nitrates, and calcium channel blockers.  Instructor discusses reasons, side effects, and lifestyle considerations for each drug class. Flowsheet Row CARDIAC REHAB PHASE II EXERCISE from 02/03/2016 in [place]  Date  01/27/16  Educator  Pharm D  Instruction Review Code  2- meets goals/outcomes      Anatomy and Physiology of the Circulatory System:  -Group instruction provided by verbal instruction, video, and written materials to support subject.  Reviews functional anatomy of heart, how it relates to various diagnoses, and what role the heart plays in the overall system. [place] PHASE II EXERCISE from 02/03/2016 in [place]  Date  01/20/16  Instruction Review Code  2- meets goals/outcomes      Knowledge Questionnaire Score:     Knowledge Questionnaire Score - 03/31/16 1634      Knowledge Questionnaire Score   Post Score 23/24      Core Components/Risk Factors/Patient Goals at Admission:     Personal Goals and Risk Factors at Admission - 12/17/15 0828      Core Components/Risk Factors/Patient Goals on Admission    Weight Management Yes;Obesity   Intervention Weight Management: Develop a combined nutrition and exercise program designed to reach desired caloric intake, while maintaining appropriate intake of nutrient and fiber, sodium and fats, and appropriate  energy expenditure required for the weight goal.;Weight Management: Provide education and appropriate resources to help participant work on and attain dietary goals.;Weight Management/Obesity: Establish reasonable short term and long term weight goals.;Obesity: Provide education and appropriate resources to help participant work on and attain dietary goals.   Expected Outcomes Short Term: Continue to assess and modify interventions until short term weight is achieved;Weight Maintenance: Understanding of the daily nutrition guidelines, which includes 25-35% calories from fat, 7% or less cal from saturated fats, less than 241m cholesterol, less than 1.5gm of sodium, & 5 or more servings of fruits and vegetables daily;Weight Loss: Understanding of general recommendations for a balanced deficit meal plan, which promotes 1-2 lb weight loss per week  and includes a negative energy balance of [phone] kcal/d;Understanding recommendations for meals to include 15-35% energy as protein, 25-35% energy from fat, 35-60% energy from carbohydrates, less than 272m of dietary cholesterol, 20-35 gm of total fiber daily;Understanding of distribution of calorie intake throughout the day with the consumption of 4-5 meals/snacks   Increase Strength and Stamina Yes   Intervention Provide advice, education, support and counseling about physical activity/exercise needs.;Develop an individualized exercise prescription for aerobic and resistive training based on initial evaluation findings, risk stratification, comorbidities and participant's personal goals.   Expected Outcomes Achievement of increased cardiorespiratory fitness and enhanced flexibility, muscular endurance and strength shown through measurements of functional capacity and personal statement of participant.   Improve shortness of breath with ADL's Yes   Intervention Provide education, individualized exercise plan and daily activity instruction to help decrease symptoms  of SOB with activities of daily living.   Expected Outcomes Short Term: Achieves a reduction of symptoms when performing activities of daily living.   Hypertension Yes   Intervention Provide education on lifestyle modifcations including regular physical activity/exercise, weight management, moderate sodium restriction and increased consumption of fresh fruit, vegetables, and low fat dairy, alcohol moderation, and smoking cessation.;Monitor prescription use compliance.   Expected Outcomes Short Term: Continued assessment and intervention until BP is < 140/950mHG in hypertensive participants. < 130/8050mG in hypertensive participants with diabetes, heart failure or chronic kidney disease.;Long Term: Maintenance of blood pressure at goal levels.   Lipids Yes   Intervention Provide education and support for participant on nutrition & aerobic/resistive exercise along with prescribed medications to achieve LDL <35m55mDL >40mg55mExpected Outcomes Short Term: Participant states understanding of desired cholesterol values and is compliant with medications prescribed. Participant is following exercise prescription and nutrition guidelines.;Long Term: Cholesterol controlled with medications as prescribed, with individualized exercise RX and with personalized nutrition plan. Value goals: LDL < 35mg,63m > 40 mg.   Personal Goal Other Yes   Personal Goal short: Be able to do more activities with less fatigue  Long: Be able to climb stairs with less SOB and lose 20lbs   Intervention Provide nutrition and education counseling to assist with increaseing exercise capacity, losing weight and increasing energy levels   Expected Outcomes Pt will be able to lose wt, do more activities and be able to climb stairs without difficulties      Core Components/Risk Factors/Patient Goals Review:      Goals and Risk Factor Review    Row Name 01/25/16 1712 02/24/16 1716 03/22/16 1133 04/15/16 1129       Core  Components/Risk Factors/Patient Goals Review   Personal Goals Review Other Improve shortness of breath with ADL's Other;Weight Management/Obesity Weight Management/Obesity    Review Pt is doing well with ADL's and steady improving exercise tolerance Pt is able to climb stairs with less fatigue and SOB. Pt states " it easier to climb stairs" Pt weight is fluctuating but overall feeling great and stamina is improving Pt has gained 5.5 lb.     Expected Outcomes Pt will continue to improve in cardiovascular fitness and be able to do household chores without difficulty. Pt will continue to improve in cardiovascular fitness and be able to perform functional activites without difficulty Pt will continue to improve in cardiovascular fitness and be able to perform functional activites without difficulty Slow wt loss of 1-2 lb/week to a wt loss goal of 6-24 lb decrease over 3 months.       Core  Components/Risk Factors/Patient Goals at Discharge (Final Review):      Goals and Risk Factor Review - 04/15/16 1129      Core Components/Risk Factors/Patient Goals Review   Personal Goals Review Weight Management/Obesity   Review Pt has gained 5.5 lb.    Expected Outcomes Slow wt loss of 1-2 lb/week to a wt loss goal of 6-24 lb decrease over 3 months.      ITP Comments:     ITP Comments    Row Name 12/17/15 [phone] 02/19/16 1443         ITP Comments Dr. Fransico Him, Medical Director attended Hypertension education lecture         Comments: Pt graduated from cardiac rehab program today with completion of 36 exercise sessions in Phase II. Pt maintained good attendance and progressed nicely during her  participation in rehab as evidenced by increased MET level.   Medication list reconciled. Repeat  PHQ score- 0, which is improved. Pt reports symptoms are well managed with lexapro as prescribed by her PCP .  Pt has made significant lifestyle changes and should be commended for her success. Pt feels she has  achieved his goals during cardiac rehab, which include increased strength and stamina. Pt very proudly reports she was able to rake leaves yesterday without dyspnea.    Pt plans to continue exercising on her own at [employer].

## 2016-04-04 NOTE — Addendum Note (Signed)
Addended by: Alexia Freestone on: 04/04/2016 11:39 AM   Modules accepted: Orders

## 2016-04-05 NOTE — Addendum Note (Signed)
Addended by: Alexia Freestone on: 04/05/2016 08:11 AM   Modules accepted: Orders

## 2016-04-30 ENCOUNTER — Other Ambulatory Visit: Payer: Self-pay | Admitting: Nurse Practitioner

## 2016-05-19 ENCOUNTER — Ambulatory Visit (INDEPENDENT_AMBULATORY_CARE_PROVIDER_SITE_OTHER): Payer: Medicare Other

## 2016-05-19 ENCOUNTER — Encounter: Payer: Self-pay | Admitting: Podiatry

## 2016-05-19 ENCOUNTER — Telehealth: Payer: Self-pay | Admitting: *Deleted

## 2016-05-19 ENCOUNTER — Ambulatory Visit (INDEPENDENT_AMBULATORY_CARE_PROVIDER_SITE_OTHER): Payer: Medicare Other | Admitting: Podiatry

## 2016-05-19 DIAGNOSIS — M25571 Pain in right ankle and joints of right foot: Secondary | ICD-10-CM | POA: Diagnosis not present

## 2016-05-19 DIAGNOSIS — S99911A Unspecified injury of right ankle, initial encounter: Secondary | ICD-10-CM | POA: Diagnosis not present

## 2016-05-19 DIAGNOSIS — M722 Plantar fascial fibromatosis: Secondary | ICD-10-CM | POA: Diagnosis not present

## 2016-05-19 DIAGNOSIS — R2681 Unsteadiness on feet: Secondary | ICD-10-CM | POA: Diagnosis not present

## 2016-05-19 NOTE — Progress Notes (Signed)
She presents today states that she feels like she is having pain to the anterolateral aspect of the right foot and her right heel. She states that she started falling more frequently. She also states that she's been twisting this foot more frequently. She feels like she is losing her balance.  Objective: Vital signs are stable she's alert and oriented 3 pulses are palpable. She is pain on palpation of the sinus tarsi of the right foot and on N range of motion of the subtalar joint right. She also has tenderness on palpation of the medial calcaneal tubercle of the right heel. She has tenderness on palpation and inversion of the ankle along the anterior talofibular ligament. Radiographs 3 views ankle and foot taken today of the right side do not demonstrate any type of abnormal osseous architecture. No fractures are identified.  Assessment: Ankle sprain and lateral ankle instability associated with subtalar joint capsulitis right foot and plantar fasciitis right foot.  Plan: I have referred her to physical therapy for strengthening and balance training. I also referred her for plantar fasciitis. I reinjected the plantar fascia today in the sinus tarsi of the right foot both with Kenalog and local anesthetic. Follow-up with her and proximally one month

## 2016-05-19 NOTE — Telephone Encounter (Addendum)
-----   Message from Rip Harbour, [facility] sent at 05/19/2016  8:49 AM EST ----- Regarding: PT referral Patient given PT referral to set up appts at [facility]. Required form, demographics faxed to [facility].

## 2016-05-24 ENCOUNTER — Ambulatory Visit: Payer: Medicare Other | Admitting: Podiatry

## 2016-06-01 ENCOUNTER — Ambulatory Visit: Payer: Medicare Other | Admitting: Nurse Practitioner

## 2016-06-08 ENCOUNTER — Ambulatory Visit (INDEPENDENT_AMBULATORY_CARE_PROVIDER_SITE_OTHER): Payer: Medicare Other | Admitting: Nurse Practitioner

## 2016-06-08 ENCOUNTER — Encounter: Payer: Self-pay | Admitting: Nurse Practitioner

## 2016-06-08 VITALS — BP 110/70 | HR 71 | Ht 60.0 in | Wt 169.0 lb

## 2016-06-08 DIAGNOSIS — I2583 Coronary atherosclerosis due to lipid rich plaque: Secondary | ICD-10-CM

## 2016-06-08 DIAGNOSIS — E7800 Pure hypercholesterolemia, unspecified: Secondary | ICD-10-CM | POA: Diagnosis not present

## 2016-06-08 DIAGNOSIS — I2510 Atherosclerotic heart disease of native coronary artery without angina pectoris: Secondary | ICD-10-CM

## 2016-06-08 NOTE — Progress Notes (Signed)
CARDIOLOGY OFFICE NOTE  Date:  06/08/2016    [name] Date of Birth: 1946-04-21 Medical Record [id number]  PCP:  Simona Huh, MD  Cardiologist:  [facility]   Chief Complaint  Patient presents with  . Coronary Artery Disease    4 month check - seen for Dr. Marlou Porch    History of Present Illness: Angela Bond is a 71 y.o. female who presents today for a 4 month check. Seen for Dr. Marlou Porch.   She has a past medical history of CAD s/p NSTEMI in June 2017 with DES to mid LAD, HTN, orthostatic hypotension, HLD,and anxiety.   She presented to [facility] with chest pain/NSTEMI with cath in June of 2017 of this year. She had successful DES placement to her mid LAD. There was residual disease in her proximal RCA (30%) and 35% in his ostial 2nd diagonal. She was placed on DAPT with ASA and Brilinta which was switched to Plavix outpatient for cost reasons. Of note, she was seen in the ED on 11/10/15 for chest pain 4 days after she was discharged from her NSTEMI. A GI cocktail relieved her pain in the ED and she was discharged from the ED. Her lisinopril was discontinued due to some orthostatic hypotension in the office in July of 2017. Beta blocker has been subsequently stopped for orthostasis as well.   She had chest pain during cardiac rehab - ended up getting admitted and had inpatient stress testing which was stable without ischemia. EF 68%. Tried to rechallenge her with Toprol but was stopped again.   Last seen back in September by Dr. Marlou Porch and she was doing well. Crestor changed to Lovastatin due to cost.   Comes in today. Here alone. She is doing well. No complaints. No chest pain. Breathing is ok as long as she does not overexert too much. Tries to stay active. Seeing PCP next month for all her labs. She has no real concerns today. She continues to bruise easily - this is chronic and due to DAPT therapy. No recent falls.   Past Medical History:  Diagnosis Date  . Anemia    hx  . Anxiety and depression   . Arthritis    "~ all over my body; in the joints"  . Breast cancer, right ([place]) 3/16  . Coronary artery disease    a.  s/p NSTEMI in June 2017 with DES to mid LAD.  Marland Kitchen Depression   . GERD (gastroesophageal reflux disease)   . H/O hiatal hernia   . Heart murmur   . History of blood transfusion    "when taking chemo & w/one of my knee replacements" (11/05/2015)  . Hypercholesteremia   . Hypertension   . Orthostatic hypotension   . Pneumonia X 1  . Shingles 5/15   hx  . Shortness of breath    "stomach is in lungs" when had hernia 2014 none now(07/28/14)  . Sinus headache     Past Surgical History:  Procedure Laterality Date  . BACK SURGERY    . BREAST BIOPSY Right   . BREAST LUMPECTOMY WITH SENTINEL LYMPH NODE BIOPSY Right 09/22/2014   Procedure: RIGHT BREAST LUMPECTOMY WITH NEEDLE LOCALIZATION AND RIGHT AXILLARY SENTINEL LYMPH NODE BIOPSY;  Surgeon: [name], MD;  Location: [place];  Service: General;  Laterality: Right;  . CARDIAC CATHETERIZATION    . CARDIAC CATHETERIZATION N/A 11/05/2015   Procedure: Left Heart Cath and Coronary Angiography;  Surgeon: Belva Crome, MD;  Location:  [place] INVASIVE CV LAB;  Service: Cardiovascular;  Laterality: N/A;  . CARDIAC CATHETERIZATION N/A 11/05/2015   Procedure: Coronary Stent Intervention;  Surgeon: Belva Crome, MD;  Location: [place] CV LAB;  Service: Cardiovascular;  Laterality: N/A;  . CARPAL TUNNEL RELEASE Right 09/2015  . CORONARY ANGIOPLASTY WITH STENT PLACEMENT  11/05/2015   "1 stent"  . ESOPHAGEAL MANOMETRY N/A 07/02/2012   Procedure: ESOPHAGEAL MANOMETRY (EM);  Surgeon: Lear Ng, MD;  Location: [facility];  Service: Endoscopy;  Laterality: N/A;  schooler to read  . EYE SURGERY Left 2013   tube placed tear duct  . HERNIA REPAIR    . HIATAL HERNIA REPAIR N/A 08/10/2012   Procedure: LAPAROSCOPIC REPAIR OF HIATAL HERNIA WITH MESH AND EGD WITH PEG TUBE PLACEMENT;  Surgeon: Ralene Ok,  MD;  Location: [facility];  Service: General;  Laterality: N/A;  . JOINT REPLACEMENT    . PORT-A-CATH REMOVAL  06/2015  . PORTACATH PLACEMENT Left 09/22/2014   Procedure: INSERTION PORT-A-CATH;  Surgeon: [name], MD;  Location: [place];  Service: General;  Laterality: Left;  . POSTERIOR LUMBAR FUSION  2016   "had 2 vertebrae fused"  . TONSILLECTOMY  as child  . TOTAL KNEE ARTHROPLASTY Bilateral 2011,2012     Medications: Current Outpatient Prescriptions  Medication Sig Dispense Refill  . aspirin EC 81 MG EC tablet Take 1 tablet (81 mg total) by mouth daily.    . calcium carbonate (OS-CAL) 600 MG TABS Take 600 mg by mouth 2 (two) times daily with a meal.    . carboxymethylcellulose (REFRESH PLUS) 0.5 % SOLN Place 1 drop into both eyes QID.     Marland Kitchen cephALEXin (KEFLEX) 250 MG capsule Take 250 mg by mouth at bedtime.     . Cholecalciferol (VITAMIN D) 2000 UNITS tablet Take 2,000 Units by mouth 2 (two) times daily.     . clopidogrel (PLAVIX) 75 MG tablet Take 1 tablet (75 mg total) by mouth daily. 90 tablet 3  . dexlansoprazole (DEXILANT) 60 MG capsule Take 60 mg by mouth daily.    Marland Kitchen escitalopram (LEXAPRO) 20 MG tablet Take 20 mg by mouth daily.    . fexofenadine (ALLEGRA) 180 MG tablet Take 180 mg by mouth daily.    Marland Kitchen gabapentin (NEURONTIN) 300 MG capsule Take 1 capsule by mouth 3  times daily 270 capsule 1  . LORazepam (ATIVAN) 1 MG tablet Take 1 mg by mouth at bedtime. For sleeping per patient    . lovastatin (MEVACOR) 40 MG tablet Take 40 mg by mouth at bedtime.     . meclizine (ANTIVERT) 25 MG tablet Take 12.5-25 mg by mouth 3 (three) times daily as needed for dizziness.    . nitroGLYCERIN (NITROSTAT) 0.4 MG SL tablet Place 1 tablet (0.4 mg total) under the tongue every 5 (five) minutes as needed for chest pain (CP or SOB). 25 tablet 2  . ranitidine (ZANTAC) 150 MG tablet Take 150 mg by mouth at bedtime.    . sodium chloride (OCEAN) 0.65 % SOLN nasal spray Place 2 sprays into both nostrils  as needed for congestion.     No current facility-administered medications for this visit.     Allergies: Allergies  Allergen Reactions  . Shellfish Allergy Anaphylaxis    ANAPHYLAXIS TO OYSTERS  . Lactose Intolerance (Gi) Other (See Comments)  . Brilinta [Ticagrelor]     Other reaction(s): Other (See Comments) Indigestion Indigestion  . Nitrofurantoin     Other reaction(s): Other (See Comments) "  broke out down there" "broke out down there"  . Penicillins Nausea And Vomiting    Has patient had a PCN reaction causing immediate rash, facial/tongue/throat swelling, SOB or lightheadedness with hypotension: Yes Has patient had a PCN reaction causing severe rash involving mucus membranes or skin necrosis: No Has patient had a PCN reaction that required hospitalization No Has patient had a PCN reaction occurring within the last 10 years: No If all of the above answers are "NO", then may proceed with Cephalosporin use.   [name] [Naproxen] Nausea And Vomiting  . Aspirin Other (See Comments)    Burns stomach  . Ciprofloxacin Nausea And Vomiting  . Doxycycline Nausea And Vomiting  . Evista [Raloxifene] Other (See Comments)    LEG ACHES  . Fosamax [Alendronate Sodium] Other (See Comments)    Leg aches  . Latex Rash  . Naproxen Sodium Nausea And Vomiting  . Pravachol [Pravastatin] Other (See Comments)    GI upset   . Sulfa Antibiotics Nausea Only  . Tape Rash  . Welchol [Colesevelam Hcl] Other (See Comments)    Stomach cramps  . Zocor [Simvastatin] Rash    Social History: The patient  reports that she has never smoked. She has never used smokeless tobacco. She reports that she does not drink alcohol or use drugs.   Family History: The patient's family history includes Cancer in her father and sister; Diabetes in her father and sister; Heart disease in her father, mother, and sister.   Review of Systems: Please see the history of present illness.   Otherwise, the review of  systems is positive for none.   All other systems are reviewed and negative.   Physical Exam: VS:  BP 110/70   Pulse 71   Ht 5' (1.524 m)   Wt 169 lb (76.7 kg)   SpO2 100% Comment: at rest  BMI 33.01 kg/m  .  BMI Body mass index is 33.01 kg/m.  Wt Readings from Last 3 Encounters:  06/08/16 169 lb (76.7 kg)  04/12/16 167 lb 1.7 oz (75.8 kg)  03/16/16 165 lb 4.8 oz (75 kg)    General: Pleasant. Elderly female who is obese. She is alert and in no acute distress.   HEENT: Normal.  Neck: Supple, no JVD, carotid bruits, or masses noted.  Cardiac: Regular rate and rhythm. No murmurs, rubs, or gallops. No edema.  Respiratory:  Lungs are clear to auscultation bilaterally with normal work of breathing.  GI: Soft and nontender.  MS: No deformity or atrophy. Gait and ROM intact.  Skin: Warm and dry. Color is normal.  Neuro:  Strength and sensation are intact and no gross focal deficits noted.  Psych: Alert, appropriate and with normal affect.   LABORATORY DATA:  EKG:  EKG is not ordered today.  Lab Results  Component Value Date   WBC 6.5 03/23/2016   HGB 12.2 03/23/2016   HCT 38.0 03/23/2016   PLT 207 03/23/2016   GLUCOSE 89 03/23/2016   CHOL 158 11/06/2015   TRIG 113 11/06/2015   HDL 47 11/06/2015   LDLCALC 88 11/06/2015   ALT 11 03/16/2016   AST 15 03/16/2016   NA 140 03/23/2016   K 4.5 03/23/2016   CL 100 (L) 03/23/2016   CREATININE 0.82 03/23/2016   BUN 8 03/23/2016   CO2 31 03/23/2016   TSH 1.640 09/20/2013   INR 1.34 12/24/2014   HGBA1C 5.5 11/05/2015    BNP (last 3 results) No results for input(s): BNP in  the last 8760 hours.  ProBNP (last 3 results) No results for input(s): PROBNP in the last 8760 hours.   Other Studies Reviewed Today: Cath 11/05/15: 1. Prox RCA to Mid RCA lesion, 30% stenosed. 2. Mid LAD lesion, 99% stenosed. Post intervention, there is a 5% residual stenosis. 3. Ost 2nd Diag lesion, 35% stenosed.   Acute coronary syndrome caused  by acute/subacute thrombotic obstruction of the proximal LAD forming a [place] 111 bifurcation lesion with the large diagonal #1.  Luminal irregularities are noted in the circumflex and right coronary.  Severe anteroapical hypokinesis with estimated ejection fraction 35-40%. LV end-diastolic pressure is normal.  Successful PTCA and stent of the LAD thrombus containing lesion with reduction in the 95% stenosis to 0% with TIMI grade 3 flow using a drug-eluting stent postdilated to 3.5 mm in diameter. The diagonal branch though "jailed" ended with less than 40% ostial obstruction.  RECOMMENDATIONS: 1. Probable discharge in a.m. assuming no intercurrent complications. 2. Low dose Ace and beta blocker therapy were started due to LV dysfunction in the setting of acute coronary syndrome. Suspect the left ventricular function will significantly improved and of the wall motion abnormality is related to "stunned" myocardium. 3. High intensity statin therapy was ordered although the patient has many potential allergies/intolerances.   NUC stress 01/16/16: 1. Septal scar/infarction. No findings for ischemia.  2. Septal wall motion abnormality and bulging of the inferoseptal wall with contraction.  3. Left ventricular ejection fraction 68%  4. Non invasive risk stratification*: Intermediate to low risk.  Assessment/Plan:  1. Coronary artery disease  - Stable with no active anginal symptoms. Prior DES to mid LAD. Low risk Myoview from 01/2016. Would continue with CV risk factor modification.   2. HTN with prior Orthostatic hypotension  - Currently very stable. We have to be careful with her blood pressure medications. Currently not an issue.   3. Hyperlipidemia   - Continue with lovastatin. She does not wish to take atorvastatin. Crestor to expensive. She will have her labs next month with her PCP   Current medicines are reviewed with the patient today.  The patient does not have concerns  regarding medicines other than what has been noted above.  The following changes have been made:  See above.  Labs/ tests ordered today include:   No orders of the defined types were placed in this encounter.    Disposition:   FU with Dr. Marlou Porch in 4 months.   Patient is agreeable to this plan and will call if any problems develop in the interim.   SignedTruitt Merle, NP  06/08/2016 11:27 AM  [facility] [address] [place], [place]  [address] Phone: [phone] Fax: [phone]

## 2016-06-08 NOTE — Patient Instructions (Addendum)
We will be checking the following labs today - NONE   Medication Instructions:    Continue with your current medicines.     Testing/Procedures To Be Arranged:  N/A  Follow-Up:   See Dr. Skains in 4 months    Other Special Instructions:   N/A    If you need a refill on your cardiac medications before your next appointment, please call your pharmacy.   Call the [facility] office at [phone] if you have any questions, problems or concerns.      

## 2016-07-05 ENCOUNTER — Other Ambulatory Visit: Payer: Self-pay | Admitting: Hematology and Oncology

## 2016-07-05 DIAGNOSIS — Z853 Personal history of malignant neoplasm of breast: Secondary | ICD-10-CM

## 2016-07-19 ENCOUNTER — Other Ambulatory Visit: Payer: Self-pay | Admitting: Podiatry

## 2016-07-19 NOTE — Telephone Encounter (Signed)
Pt needs an appt prior to future refills. 

## 2016-07-22 ENCOUNTER — Encounter: Payer: Self-pay | Admitting: *Deleted

## 2016-07-28 ENCOUNTER — Telehealth: Payer: Self-pay | Admitting: Nurse Practitioner

## 2016-07-28 NOTE — Telephone Encounter (Signed)
Patient is calling, states that she received letter to call our office. Thanks.

## 2016-07-29 NOTE — Telephone Encounter (Signed)
Spoke with pt who called by after receiving my letter.  She states her PCP reviewed her results with her and told her the cardiologist would treat her cholesterol based on her results.  Advised of Dr Marlou Porch orders however pt states she has tried so many statins and can not tolerate them.

## 2016-08-04 ENCOUNTER — Ambulatory Visit
Admission: RE | Admit: 2016-08-04 | Discharge: 2016-08-04 | Disposition: A | Discharge status: Home / Self Care | Payer: Medicare Other | Source: Ambulatory Visit | Attending: Hematology and Oncology | Admitting: Hematology and Oncology

## 2016-08-04 DIAGNOSIS — Z853 Personal history of malignant neoplasm of breast: Secondary | ICD-10-CM

## 2016-08-04 HISTORY — DX: Personal history of irradiation: Z92.3

## 2016-08-04 HISTORY — DX: Personal history of antineoplastic chemotherapy: Z92.21

## 2016-08-05 NOTE — Progress Notes (Signed)
Patient ID: Angela Bond                 DOB: June 22, 1945                    MRN: [id number]     HPI: Angela Bond is a 71 y.o. female patient of Dr. Marlou Porch that presents today for lipid evaluation. PMH includes CAD s/p NSTEMI in June 2017 with DES to mid LAD, HTN, orthostatic hypotension, HLD,and anxiety. Used to be on Crestor, but was too expensive and was changed to lovastatin.  Patient was seen by Truitt Merle, NP 2 months ago. At that time, it was noted that the patient did not wish to try atorvastatin (she has had friends with leg aches) and that rosuvastatin was too expensive. She does not remember how long ago she tried rosuvastatin, and whether it had become generic at that point yet. She had the most issues with simvastatin, which caused significant muscle aches.   She presents today and notes that her legs ache, but that it could be related to her varicose veins and other comorbidities. She is willing to try rosuvastatin and see how she tolerates it.   LDL Goal: <70  Current Medications: lovastatin 40mg  daily  Intolerances: Will not try Lipitor; pravastatin 40mg  daily; simvastatin - leg aches  Diet: Typically eats out at fast food places, but tries to eat grilled instead of fried foods. She avoids excess sugars.    Exercise: Is getting ready to start Silver Sneakers.   Family History: The patient's family history includes Cancer in her father and sister; Diabetes in her father and sister; Heart disease in her father, mother, and sister.   Social History: Denies tobacco products and alcohol.   Labs: 07/05/16  TC 191,  HDL 65,  TG 101, LDL 106, nonHDL 127 (on lovastatin 40 mg)  Past Medical History:  Diagnosis Date  . Anemia    hx  . Anxiety and depression   . Arthritis    "~ all over my body; in the joints"  . Breast cancer, right ([place]) 3/16  . Coronary artery disease    a.  s/p NSTEMI in June 2017 with DES to mid LAD.  Angela Bond Depression   . GERD (gastroesophageal  reflux disease)   . H/O hiatal hernia   . Heart murmur   . History of blood transfusion    "when taking chemo & w/one of my knee replacements" (11/05/2015)  . Hypercholesteremia   . Hypertension   . Orthostatic hypotension   . Personal history of chemotherapy   . Personal history of radiation therapy   . Pneumonia X 1  . Shingles 5/15   hx  . Shortness of breath    "stomach is in lungs" when had hernia 2014 none now(07/28/14)  . Sinus headache     Current Outpatient Prescriptions on File Prior to Visit  Medication Sig Dispense Refill  . aspirin EC 81 MG EC tablet Take 1 tablet (81 mg total) by mouth daily.    . calcium carbonate (OS-CAL) 600 MG TABS Take 600 mg by mouth 2 (two) times daily with a meal.    . carboxymethylcellulose (REFRESH PLUS) 0.5 % SOLN Place 1 drop into both eyes QID.     Angela Bond cephALEXin (KEFLEX) 250 MG capsule Take 250 mg by mouth at bedtime.     . Cholecalciferol (VITAMIN D) 2000 UNITS tablet Take 2,000 Units by mouth 2 (two) times daily.     Angela Bond  clopidogrel (PLAVIX) 75 MG tablet Take 1 tablet (75 mg total) by mouth daily. 90 tablet 3  . dexlansoprazole (DEXILANT) 60 MG capsule Take 60 mg by mouth daily.    Angela Bond escitalopram (LEXAPRO) 20 MG tablet Take 20 mg by mouth daily.    . fexofenadine (ALLEGRA) 180 MG tablet Take 180 mg by mouth daily.    Angela Bond gabapentin (NEURONTIN) 300 MG capsule TAKE 1 CAPSULE BY MOUTH 3  TIMES DAILY 270 capsule 1  . LORazepam (ATIVAN) 1 MG tablet Take 1 mg by mouth at bedtime. For sleeping per patient    . lovastatin (MEVACOR) 40 MG tablet Take 40 mg by mouth at bedtime.     . meclizine (ANTIVERT) 25 MG tablet Take 12.5-25 mg by mouth 3 (three) times daily as needed for dizziness.    . nitroGLYCERIN (NITROSTAT) 0.4 MG SL tablet Place 1 tablet (0.4 mg total) under the tongue every 5 (five) minutes as needed for chest pain (CP or SOB). 25 tablet 2  . ranitidine (ZANTAC) 150 MG tablet Take 150 mg by mouth at bedtime.    . sodium chloride (OCEAN)  0.65 % SOLN nasal spray Place 2 sprays into both nostrils as needed for congestion.     No current facility-administered medications on file prior to visit.     Allergies  Allergen Reactions  . Shellfish Allergy Anaphylaxis    ANAPHYLAXIS TO OYSTERS  . Lactose Intolerance (Gi) Other (See Comments)  . Brilinta [Ticagrelor]     Other reaction(s): Other (See Comments) Indigestion Indigestion  . Nitrofurantoin     Other reaction(s): Other (See Comments) "broke out down there" "broke out down there"  . Penicillins Nausea And Vomiting    Has patient had a PCN reaction causing immediate rash, facial/tongue/throat swelling, SOB or lightheadedness with hypotension: Yes Has patient had a PCN reaction causing severe rash involving mucus membranes or skin necrosis: No Has patient had a PCN reaction that required hospitalization No Has patient had a PCN reaction occurring within the last 10 years: No If all of the above answers are "NO", then may proceed with Cephalosporin use.   [name] [Naproxen] Nausea And Vomiting  . Aspirin Other (See Comments)    Burns stomach  . Ciprofloxacin Nausea And Vomiting  . Doxycycline Nausea And Vomiting  . Evista [Raloxifene] Other (See Comments)    LEG ACHES  . Fosamax [Alendronate Sodium] Other (See Comments)    Leg aches  . Latex Rash  . Naproxen Sodium Nausea And Vomiting  . Pravachol [Pravastatin] Other (See Comments)    GI upset   . Sulfa Antibiotics Nausea Only  . Tape Rash  . Welchol [Colesevelam Hcl] Other (See Comments)    Stomach cramps  . Zocor [Simvastatin] Rash    Assessment/Plan: Hyperlipidemia: Patient is uncontrolled with LDL greater than goal of <70 1. Stop lovastatin and start rosuvastatin 10 mg daily. We will titrate the dose to achieve goal LDL as tolerated.  If patient does not tolerate rosuvastatin, we could switch back to lovastatin and add ezetimibe.   Follow up lipid/hepatic panel in 2-3 months with Dr. Marlou Porch.    Patient was seen today with Catie Darnelle Maffucci, PharmD Candidate 2018.  Thank you,  Lelan Pons. Patterson Hammersmith, [facility]  08/05/2016 11:02 AM

## 2016-08-09 ENCOUNTER — Encounter (INDEPENDENT_AMBULATORY_CARE_PROVIDER_SITE_OTHER): Payer: Self-pay

## 2016-08-09 ENCOUNTER — Ambulatory Visit (INDEPENDENT_AMBULATORY_CARE_PROVIDER_SITE_OTHER): Payer: Medicare Other | Admitting: Pharmacist

## 2016-08-09 DIAGNOSIS — E7800 Pure hypercholesterolemia, unspecified: Secondary | ICD-10-CM | POA: Diagnosis not present

## 2016-08-09 DIAGNOSIS — I10 Essential (primary) hypertension: Secondary | ICD-10-CM | POA: Diagnosis not present

## 2016-08-09 MED ORDER — ROSUVASTATIN CALCIUM 10 MG PO TABS
10.0000 mg | ORAL_TABLET | Freq: Every day | ORAL | 3 refills | Status: DC
Start: 2016-08-09 — End: 2016-09-22

## 2016-08-09 NOTE — Patient Instructions (Addendum)
Finish the lovastatin 40 mg that you have at home. Then, start rosuvastatin (Crestor) 10 mg daily. Call [facility] pharmacy to see what your copay will be before they mail it to you.   Follow up with lab work before you see Dr. Marlou Porch.   You can try Co-Q-10, which you can get over the counter. You can also try the magnesium that you have at home.     Cholesterol Cholesterol is a white, waxy, fat-like substance that is needed by the human body in small amounts. The liver makes all the cholesterol we need. Cholesterol is carried from the liver by the blood through the blood vessels. Deposits of cholesterol (plaques) may build up on blood vessel (artery) walls. Plaques make the arteries narrower and stiffer. Cholesterol plaques increase the risk for heart attack and stroke. You cannot feel your cholesterol level even if it is very high. The only way to know that it is high is to have a blood test. Once you know your cholesterol levels, you should keep a record of the test results. Work with your health care provider to keep your levels in the desired range. What do the results mean?  Total cholesterol is a rough measure of all the cholesterol in your blood.  LDL (low-density lipoprotein) is the "bad" cholesterol. This is the type that causes plaque to build up on the artery walls. You want this level to be low.  HDL (high-density lipoprotein) is the "good" cholesterol because it cleans the arteries and carries the LDL away. You want this level to be high.  Triglycerides are fat that the body can either burn for energy or store. High levels are closely linked to heart disease. What are the desired levels of cholesterol?  Total cholesterol below 200.  LDL below 100 for people who are at risk, below 70 for people at very high risk.  HDL above 40 is good. A level of 60 or higher is considered to be protective against heart disease.  Triglycerides below 150. How can I lower my cholesterol? Diet    Follow your diet program as told by your health care provider.  Choose fish or white meat chicken and [place], roasted or baked. Limit fatty cuts of red meat, fried foods, and processed meats, such as sausage and lunch meats.  Eat lots of fresh fruits and vegetables.  Choose whole grains, beans, pasta, potatoes, and cereals.  Choose olive oil, corn oil, or canola oil, and use only small amounts.  Avoid butter, mayonnaise, shortening, or palm kernel oils.  Avoid foods with trans fats.  Drink skim or nonfat milk and eat low-fat or nonfat yogurt and cheeses. Avoid whole milk, cream, ice cream, egg yolks, and full-fat cheeses.  Healthier desserts include angel food cake, ginger snaps, animal crackers, hard candy, popsicles, and low-fat or nonfat frozen yogurt. Avoid pastries, cakes, pies, and cookies. Exercise  Follow your exercise program as told by your health care provider. A regular program:  Helps to decrease LDL and raise HDL.  Helps with weight control.  Do things that increase your activity level, such as gardening, walking, and taking the stairs.  Ask your health care provider about ways that you can be more active in your daily life. Medicine  Take over-the-counter and prescription medicines only as told by your health care provider.  Medicine may be prescribed by your health care provider to help lower cholesterol and decrease the risk for heart disease. This is usually done if diet and exercise  have failed to bring down cholesterol levels.  If you have several risk factors, you may need medicine even if your levels are normal. This information is not intended to replace advice given to you by your health care provider. Make sure you discuss any questions you have with your health care provider. Document Released: 01/25/2001 Document Revised: 11/28/2015 Document Reviewed: 10/31/2015 Elsevier Interactive Patient Education  2017 [employer].

## 2016-08-10 ENCOUNTER — Encounter: Payer: Self-pay | Admitting: Pharmacist

## 2016-09-01 ENCOUNTER — Encounter: Payer: Self-pay | Admitting: Cardiology

## 2016-09-19 ENCOUNTER — Other Ambulatory Visit: Payer: Medicare Other | Admitting: *Deleted

## 2016-09-19 ENCOUNTER — Encounter (INDEPENDENT_AMBULATORY_CARE_PROVIDER_SITE_OTHER): Payer: Self-pay

## 2016-09-19 DIAGNOSIS — E7800 Pure hypercholesterolemia, unspecified: Secondary | ICD-10-CM

## 2016-09-19 LAB — LIPID PANEL
CHOL/HDL RATIO: 2.3 ratio (ref 0.0–4.4)
Cholesterol, Total: 129 mg/dL (ref 100–199)
HDL: 57 mg/dL (ref >39)
LDL Calculated: 49 mg/dL (ref 0–99)
Triglycerides: 117 mg/dL (ref 0–149)
VLDL CHOLESTEROL CAL: 23 mg/dL (ref 5–40)

## 2016-09-19 LAB — HEPATIC FUNCTION PANEL
ALBUMIN: 3.8 g/dL (ref 3.5–4.8)
ALK PHOS: 77 IU/L (ref 39–117)
ALT: 10 IU/L (ref 0–32)
AST: 17 IU/L (ref 0–40)
BILIRUBIN TOTAL: 0.4 mg/dL (ref 0.0–1.2)
BILIRUBIN, DIRECT: 0.14 mg/dL (ref 0.00–0.40)
TOTAL PROTEIN: 6.7 g/dL (ref 6.0–8.5)

## 2016-09-22 ENCOUNTER — Encounter: Payer: Self-pay | Admitting: Cardiology

## 2016-09-22 ENCOUNTER — Ambulatory Visit (INDEPENDENT_AMBULATORY_CARE_PROVIDER_SITE_OTHER): Payer: Medicare Other | Admitting: Cardiology

## 2016-09-22 VITALS — BP 120/70 | HR 75 | Ht 60.0 in | Wt 173.4 lb

## 2016-09-22 DIAGNOSIS — I951 Orthostatic hypotension: Secondary | ICD-10-CM

## 2016-09-22 DIAGNOSIS — I2583 Coronary atherosclerosis due to lipid rich plaque: Secondary | ICD-10-CM | POA: Diagnosis not present

## 2016-09-22 DIAGNOSIS — I2510 Atherosclerotic heart disease of native coronary artery without angina pectoris: Secondary | ICD-10-CM

## 2016-09-22 DIAGNOSIS — I252 Old myocardial infarction: Secondary | ICD-10-CM | POA: Diagnosis not present

## 2016-09-22 DIAGNOSIS — I10 Essential (primary) hypertension: Secondary | ICD-10-CM

## 2016-09-22 MED ORDER — NITROGLYCERIN 0.4 MG SL SUBL: 0.4000 mg | SUBLINGUAL_TABLET | SUBLINGUAL | 3 refills | Status: DC | PRN

## 2016-09-22 MED ORDER — EZETIMIBE 10 MG PO TABS: 10.0000 mg | ORAL_TABLET | Freq: Every day | ORAL | 3 refills | Status: DC

## 2016-09-22 MED ORDER — LOVASTATIN 40 MG PO TABS: 40.0000 mg | ORAL_TABLET | Freq: Every day | ORAL | 3 refills | Status: DC

## 2016-09-22 NOTE — Patient Instructions (Signed)
Medication Instructions:  Please stop your Crestor. Start Mevacor 40 mg a day and Zetia 10 mg a day. Continue all other medications as listed.  Follow-Up: Follow up in 6 months with Truitt Merle, NP.  You will receive a letter in the mail 2 months before you are due.  Please call us when you receive this letter to schedule your follow up appointment.  If you need a refill on your cardiac medications before your next appointment, please call your pharmacy.  Thank you for choosing [facility]!!

## 2016-09-22 NOTE — Progress Notes (Signed)
Cardiology Office Note    Date:  09/22/2016   ID:  Angela Bond, DOB 1945/07/04, MRN [id number]  PCP:  Gaynelle Arabian, MD  Cardiologist:   Candee Furbish, MD   Chief Complaint  Patient presents with  . Follow-up    History of Present Illness:  Angela Bond is a 71 y.o. female with a past medical history of CAD s/p NSTEMI in June 2017 with DES to mid LAD, HTN, orthostatic hypotension, HLD, and anxiety who presented to [facility] with chest pain. Her last cath was in June of this year, done in the setting of NSTEMI. She had successful DES placement to her mid LAD. There was residual disease in her proximal RCA (30%) and 35% in his ostial 2nd diagonal. She was placed on DAPT with ASA and Brilinta which was switched to Plavix outpatient for cost reasons. Of note, she was seen in the ED on 11/10/15 for chest pain 4 days after she was discharged from her NSTEMI. A GI cocktail relieved her pain in the ED and she was discharged from the ED. Her lisinopril was discontinued in July of this year as she had some orthostatic hypotension in the office. She reported compliance with medical therapy.   During one cardiac rehab session, she was hypotensive with BP of 88/60 post exercise. She was given a Gatorade and her BP rose to 94/58. She was dizzy and diaphoretic at the time. She called our office and her beta blocker (metoprolol 12.5 mg BID) was discontinued. She was at cardiac rehab again and developed left sided chest pain with radiation to her neck and shoulder with associated SOB, diaphoresis and nausea, somewhat reminiscent of prior angina. EKG during pain showed no acute ST/T changes.  Initial troponin was negative.Vitals were stable with BP 142/98 (mildly hypertensive). CXR NAD. Dr. Burt Knack felt her symptoms were difficult to sort out. Her PCI was just performed about 2 months ago and he felt it was unlikely that she has restenosis or new coronary plaque rupture as she has been compliant with medical  therapy. Labs otherwise notable for Hgb 10.7 (c/w prior). Per notes from our team, the patient had troponin rechecked down in nuc med and it was negative. She underwent inpatient stress test. This showed septal scar/infarction, no findings for ischemia, EF 68%. It was recommended to try her back on a nightly dose of Toprol 12.5mg  daily but she was unable to take..    She did not wish to take atorvastatin, Crestor was too expensive. She is now on lovastatin in doing well with this. She was reassured by the nuclear stress test that was low risk with no ischemia. She is now able to walk without any difficulty. She is less short of breath. Overall she is happy. She changed her depression medicine and she is feeling much better.  09/22/16-she comes in today for follow-up. Notes from Truitt Merle and pharmacy team reviewed. Crestor too weak, legs hurt. No CP, no SOB (mild up hill to house) She is also full of small skin lesions. I wonder if these are scabies.  Past Medical History:  Diagnosis Date  . Anemia    hx  . Anxiety and depression   . Arthritis    "~ all over my body; in the joints"  . Breast cancer, right ([place]) 3/16  . Coronary artery disease    a.  s/p NSTEMI in June 2017 with DES to mid LAD.  Marland Kitchen Depression   . GERD (gastroesophageal reflux disease)   .  H/O hiatal hernia   . Heart murmur   . History of blood transfusion    "when taking chemo & w/one of my knee replacements" (11/05/2015)  . Hypercholesteremia   . Hypertension   . Orthostatic hypotension   . Personal history of chemotherapy   . Personal history of radiation therapy   . Pneumonia X 1  . Shingles 5/15   hx  . Shortness of breath    "stomach is in lungs" when had hernia 2014 none now(07/28/14)  . Sinus headache     Past Surgical History:  Procedure Laterality Date  . BACK SURGERY    . BREAST BIOPSY Right   . BREAST LUMPECTOMY Right 08/2014  . BREAST LUMPECTOMY WITH SENTINEL LYMPH NODE BIOPSY Right 09/22/2014    Procedure: RIGHT BREAST LUMPECTOMY WITH NEEDLE LOCALIZATION AND RIGHT AXILLARY SENTINEL LYMPH NODE BIOPSY;  Surgeon: [name], MD;  Location: [facility];  Service: General;  Laterality: Right;  . CARDIAC CATHETERIZATION    . CARDIAC CATHETERIZATION N/A 11/05/2015   Procedure: Left Heart Cath and Coronary Angiography;  Surgeon: Belva Crome, MD;  Location: [place] [facility];  Service: Cardiovascular;  Laterality: N/A;  . CARDIAC CATHETERIZATION N/A 11/05/2015   Procedure: Coronary Stent Intervention;  Surgeon: Belva Crome, MD;  Location: [place] [facility];  Service: Cardiovascular;  Laterality: N/A;  . CARPAL TUNNEL RELEASE Right 09/2015  . CORONARY ANGIOPLASTY WITH STENT PLACEMENT  11/05/2015   "1 stent"  . ESOPHAGEAL MANOMETRY N/A 07/02/2012   Procedure: ESOPHAGEAL MANOMETRY (EM);  Surgeon: Lear Ng, MD;  Location: [facility];  Service: Endoscopy;  Laterality: N/A;  schooler to read  . EYE SURGERY Left 2013   tube placed tear duct  . HERNIA REPAIR    . HIATAL HERNIA REPAIR N/A 08/10/2012   Procedure: LAPAROSCOPIC REPAIR OF HIATAL HERNIA WITH MESH AND EGD WITH PEG TUBE PLACEMENT;  Surgeon: Ralene Ok, MD;  Location: [facility];  Service: General;  Laterality: N/A;  . JOINT REPLACEMENT    . PORT-A-CATH REMOVAL  06/2015  . PORTACATH PLACEMENT Left 09/22/2014   Procedure: INSERTION PORT-A-CATH;  Surgeon: [name], MD;  Location: [place] [facility];  Service: General;  Laterality: Left;  . POSTERIOR LUMBAR FUSION  2016   "had 2 vertebrae fused"  . TONSILLECTOMY  as child  . TOTAL KNEE ARTHROPLASTY Bilateral 2011,2012    Current Medications: Outpatient Medications Prior to Visit  Medication Sig Dispense Refill  . aspirin EC 81 MG EC tablet Take 1 tablet (81 mg total) by mouth daily.    . calcium carbonate (OS-CAL) 600 MG TABS Take 600 mg by mouth 2 (two) times daily with a meal.    . carboxymethylcellulose (REFRESH PLUS) 0.5 % SOLN Place 1 drop into both eyes 4 (four) times daily.     .  cephALEXin (KEFLEX) 250 MG capsule Take 250 mg by mouth at bedtime.     . Cholecalciferol (VITAMIN D) 2000 UNITS tablet Take 2,000 Units by mouth 2 (two) times daily.     . clopidogrel (PLAVIX) 75 MG tablet Take 1 tablet (75 mg total) by mouth daily. 90 tablet 3  . dexlansoprazole (DEXILANT) 60 MG capsule Take 60 mg by mouth daily.    Marland Kitchen escitalopram (LEXAPRO) 20 MG tablet Take 20 mg by mouth daily.    . fexofenadine (ALLEGRA) 180 MG tablet Take 180 mg by mouth daily.    Marland Kitchen gabapentin (NEURONTIN) 300 MG capsule TAKE 1 CAPSULE BY MOUTH 3  TIMES DAILY  270 capsule 1  . LORazepam (ATIVAN) 1 MG tablet Take 0.5-1 mg by mouth 2 (two) times daily as needed for anxiety. For sleeping per patient    . meclizine (ANTIVERT) 25 MG tablet Take 12.5-25 mg by mouth 3 (three) times daily as needed for dizziness.    . ranitidine (ZANTAC) 150 MG tablet Take 150 mg by mouth at bedtime.    . sodium chloride (OCEAN) 0.65 % SOLN nasal spray Place 2 sprays into both nostrils as needed for congestion.    . nitroGLYCERIN (NITROSTAT) 0.4 MG SL tablet Place 1 tablet (0.4 mg total) under the tongue every 5 (five) minutes as needed for chest pain (CP or SOB). 25 tablet 2  . rosuvastatin (CRESTOR) 10 MG tablet Take 1 tablet (10 mg total) by mouth daily. 30 tablet 3   No facility-administered medications prior to visit.      Allergies:   Shellfish allergy; Lactose intolerance (gi); Brilinta [ticagrelor]; Nitrofurantoin; Penicillins; Aleve [naproxen]; Aspirin; Ciprofloxacin; Doxycycline; Evista [raloxifene]; Fosamax [alendronate sodium]; Latex; Naproxen sodium; Pravachol [pravastatin]; Sulfa antibiotics; Tape; Welchol [colesevelam hcl]; and Zocor [simvastatin]   Social History   Social History  . Marital status: Married    Spouse name: N/A  . Number of children: 1  . Years of education: N/A   Social History Main Topics  . Smoking status: Never Smoker  . Smokeless tobacco: Never Used  . Alcohol use No  . Drug use: No  .  Sexual activity: No   Other Topics Concern  . None   Social History Narrative   Lives with husband.  Does not use assist device.       Family History:  The patient's family history includes Cancer in her father and sister; Diabetes in her father and sister; Heart disease in her father, mother, and sister.   ROS:   Please see the history of present illness.    ROS denies syncope, orthopnea, PND.  PHYSICAL EXAM:   VS:  BP 120/70   Pulse 75   Ht 5' (1.524 m)   Wt 173 lb 6.4 oz (78.7 kg)   BMI 33.86 kg/m    GEN: Well nourished, well developed, in no acute distress , smells of urine room HEENT: normal  Neck: no JVD, carotid bruits, or masses Cardiac: RRR; no murmurs, rubs, or gallops, minor lower extremity, right greater than left edema  Respiratory:  clear to auscultation bilaterally, normal work of breathing GI: soft, nontender, nondistended, + BS MS: no deformity or atrophy  Skin: Multiple skin excoriations throughout her body, arms and legs, ? Scabies Neuro:  Alert and Oriented x 3, Strength and sensation are intact Psych: euthymic mood, full affect  Wt Readings from Last 3 Encounters:  09/22/16 173 lb 6.4 oz (78.7 kg)  06/08/16 169 lb (76.7 kg)  04/12/16 167 lb 1.7 oz (75.8 kg)      Studies/Labs Reviewed:   EKG:  None today  Recent Labs: 03/23/2016: BUN 8; Creatinine, Ser 0.82; Hemoglobin 12.2; Platelets 207; Potassium 4.5; Sodium 140 09/19/2016: ALT 10   Lipid Panel    Component Value Date/Time   CHOL 129 09/19/2016 0944   TRIG 117 09/19/2016 0944   HDL 57 09/19/2016 0944   CHOLHDL 2.3 09/19/2016 0944   CHOLHDL 3.4 11/06/2015 0519   VLDL 23 11/06/2015 0519   LDLCALC 49 09/19/2016 0944    Additional studies/ records that were reviewed today include:   Cath 11/05/15: 1. Prox RCA to Mid RCA lesion, 30% stenosed. 2. Mid LAD  lesion, 99% stenosed. Post intervention, there is a 5% residual stenosis. 3. Ost 2nd Diag lesion, 35% stenosed.    Acute coronary  syndrome caused by acute/subacute thrombotic obstruction of the proximal LAD forming a [place] 111 bifurcation lesion with the large diagonal #1.  Luminal irregularities are noted in the circumflex and right coronary.  Severe anteroapical hypokinesis with estimated ejection fraction 35-40%. LV end-diastolic pressure is normal.  Successful PTCA and stent of the LAD thrombus containing lesion with reduction in the 95% stenosis to 0% with TIMI grade 3 flow using a drug-eluting stent postdilated to 3.5 mm in diameter. The diagonal branch though "jailed" ended with less than 40% ostial obstruction.  RECOMMENDATIONS: 1. Probable discharge in a.m. assuming no intercurrent complications. 2. Low dose Ace and beta blocker therapy were started due to LV dysfunction in the setting of acute coronary syndrome. Suspect the left ventricular function will significantly improved and of the wall motion abnormality is related to "stunned" myocardium. 3. High intensity statin therapy was ordered although the patient has many potential allergies/intolerances.   NUC stress 01/16/16: 1. Septal scar/infarction.  No findings for ischemia.  2. Septal wall motion abnormality and bulging of the inferoseptal wall with contraction.  3. Left ventricular ejection fraction 68%  4. Non invasive risk stratification*: Intermediate to low risk.    ASSESSMENT:    1. Coronary artery disease due to lipid rich plaque   2. History of MI (myocardial infarction)   3. Essential hypertension   4. Orthostatic hypotension      PLAN:  In order of problems listed above:  Coronary artery disease  - Stable with no active anginal symptoms.  DES to mid LAD, Mild residual disease RCA and ostial second diagonal. Dual antiplatelet therapy. Plavix. Cost reasons. - Since she had pain during cardiac rehabilitation, she was admitted and had inpatient stress test which was stable without ischemia, EF 68%. They tried to rechallenge her  Toprol but this was stopped again.  - Overall she's not having any chest discomfort stable.  History of MI  - June 2017-non-ST elevation myocardial infarction. DES to mid LAD.  Orthostatic hypotension  - Currently very stable. We have to be careful with her blood pressure medications. She has been hypotensive at times post exercise. Previously lisinopril was discontinued because of orthostasis. Beta blocker had been stopped as well because of orthostasis.  - Willing to tolerate higher blood pressure.  Hyperlipidemia  -She states that she did not tolerate the Crestor. Stopped it 2 days ago because of leg weakness and cramping. She wishes to go back on lovastatin 40 and add Zetia. In 6 months we can recheck the lipid panel.  Skin lesions  - I have asked her to address this with Dr. Marisue Humble, question the possibility of scabies.  We will see her back in 6 months,APP.     Medication Adjustments/Labs and Tests Ordered: Current medicines are reviewed at length with the patient today.  Concerns regarding medicines are outlined above.  Medication changes, Labs and Tests ordered today are listed in the Patient Instructions below. Patient Instructions  Medication Instructions:  Please stop your Crestor. Start Mevacor 40 mg a day and Zetia 10 mg a day. Continue all other medications as listed.  Follow-Up: Follow up in 6 months with Truitt Merle, NP.  You will receive a letter in the mail 2 months before you are due.  Please call us when you receive this letter to schedule your follow up appointment.  If you need  a refill on your cardiac medications before your next appointment, please call your pharmacy.  Thank you for choosing [facility]!!        Signed, Candee Furbish, MD  09/22/2016 10:41 AM    [facility] [address], [place], [place]  [address] Phone: [phone]; Fax: [phone]

## 2016-10-05 ENCOUNTER — Other Ambulatory Visit: Payer: Self-pay | Admitting: Physician Assistant

## 2016-12-26 ENCOUNTER — Other Ambulatory Visit: Payer: Self-pay | Admitting: Cardiology

## 2017-01-26 ENCOUNTER — Other Ambulatory Visit: Payer: Self-pay | Admitting: Podiatry

## 2017-02-16 ENCOUNTER — Telehealth: Payer: Self-pay | Admitting: Hematology and Oncology

## 2017-02-16 ENCOUNTER — Ambulatory Visit (HOSPITAL_BASED_OUTPATIENT_CLINIC_OR_DEPARTMENT_OTHER): Payer: Medicare Other | Admitting: Hematology and Oncology

## 2017-02-16 ENCOUNTER — Ambulatory Visit (HOSPITAL_BASED_OUTPATIENT_CLINIC_OR_DEPARTMENT_OTHER): Payer: Medicare Other

## 2017-02-16 VITALS — BP 148/81 | HR 70 | Temp 98.2°F | Resp 18

## 2017-02-16 DIAGNOSIS — D508 Other iron deficiency anemias: Secondary | ICD-10-CM

## 2017-02-16 DIAGNOSIS — D509 Iron deficiency anemia, unspecified: Secondary | ICD-10-CM | POA: Insufficient documentation

## 2017-02-16 DIAGNOSIS — Z853 Personal history of malignant neoplasm of breast: Secondary | ICD-10-CM | POA: Diagnosis not present

## 2017-02-16 HISTORY — DX: Iron deficiency anemia, unspecified: D50.9

## 2017-02-16 MED ORDER — SODIUM CHLORIDE 0.9 % IV SOLN
Freq: Once | INTRAVENOUS | Status: AC
  Administered 2017-02-16: 16:00:00 via INTRAVENOUS

## 2017-02-16 MED ORDER — SODIUM CHLORIDE 0.9 % IV SOLN
510.0000 mg | Freq: Once | INTRAVENOUS | Status: AC
  Administered 2017-02-16: 510 mg via INTRAVENOUS
  Filled 2017-02-16: qty 17

## 2017-02-16 NOTE — Progress Notes (Signed)
Patient Care Team: Gaynelle Arabian, MD as PCP - General (Family Medicine) Jerline Pain, MD as Consulting Physician (Cardiology) Wonda Horner, MD as Consulting Physician (Gastroenterology) Ralene Ok, MD as Consulting Physician (Surgery) Nicholas Lose, MD as Consulting Physician (Hematology and Oncology) Sylvan Cheese, NP as Nurse Practitioner (Hematology and Oncology) Jovita Kussmaul, MD as Consulting Physician (General Surgery) Thea Silversmith, MD (Inactive) as Consulting Physician (Radiation Oncology)  DIAGNOSIS:  Encounter Diagnosis  Name Primary?  . Iron deficiency anemia secondary to inadequate dietary iron intake     SUMMARY OF ONCOLOGIC HISTORY:   Breast cancer of upper-outer quadrant of right female breast ([place])   07/30/2014 Mammogram    Right breast: Mass at 10 o'clock location 4 cm from the nipple measuring 1.7 x 1.3 x 1.3 cm. This corresponds to the palpable and mammographic finding. In the right axilla, there are several lymph nodes which are normal in size measuring up to 48m.      08/27/2014 Initial Biopsy    Right breast core needle bx: Invasive ductal carcinoma with LVI, grade 3, ER- (0%), PR- (0%), HER2/neu negative, Ki67 30%      08/27/2014 Clinical Stage    Stage IIA: T2 N0      09/22/2014 Definitive Surgery    Right lumpectomy/SLNB (Marlou Starks: Invasive ductal carcinoma, grade 3, 2.6 cm, high grade DCIS, LVI present, ER-, PR-, 2 LN removed and 1 positive for metastatic carcinoma (1/2)      09/22/2014 Pathologic Stage    Stage IIB: T2 N1 M0      10/15/2014 - 12/16/2014 Chemotherapy    Adjuvant chemotherapy with Taxotere Cytoxan 4 q3 weeks      10/21/2014 - 10/24/2014 Hospital Admission    Admission for neutropenic fever UTI Klebsiella      01/26/2015 - 03/11/2015 Radiation Therapy    Adjuvant RT (Pablo Ledger: Right breast / 45 Gray @ 1.8 GPearline Cablesper fraction x 25 fractions Right supraclavicular fossa/PAB 45 Gy '@1' .8 Gy per fraction x 25 fractions  Right breast boost / 16 Gray at 2[employer] Corporationper fraction x 8 fractions      05/19/2015 Survivorship    Survivorship care plan visit completed.       CHIEF COMPLIANT: [place]  INTERVAL HISTORY: Angela SCHROYERis a 71year old breast cancer survivor who was noted to have severe iron deficiency anemia and [facility]. At that time she had severe microcytic anemia and profound iron deficiency. She was put on or lateral therapy by primary care physician. Repeat hemoglobin check not show any improvement. Because of [place] She had colonoscopy which did not show any evidence of bleeding. 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 She complains of severe fatigue and shortness of breath to minimal exertion.  REVIEW OF SYSTEMS:   Constitutional: Denies fevers, chills or abnormal weight loss Eyes: Denies blurriness of vision Ears, nose, mouth, throat, and face: Denies mucositis or sore throat Respiratory: Denies cough, dyspnea or wheezes Cardiovascular: Denies palpitation, chest discomfort Gastrointestinal:  Denies nausea, heartburn or change in bowel habits Skin: Denies abnormal skin rashes Lymphatics: Denies new lymphadenopathy or easy bruising Neurological:Denies numbness, tingling or new weaknesses Behavioral/Psych: Mood is stable, no new changes  Extremities: No lower extremity edema Breast:  denies any pain or lumps or nodules in either breasts All other systems were  reviewed with the patient and are negative.  I have reviewed the past medical history, past surgical history, social history and family history with the patient and they are unchanged from previous note.  ALLERGIES:  is allergic to shellfish allergy; lactose intolerance (gi); brilinta [ticagrelor]; nitrofurantoin; penicillins; aleve [naproxen]; aspirin; ciprofloxacin;  doxycycline; evista [raloxifene]; fosamax [alendronate sodium]; latex; naproxen sodium; pravachol [pravastatin]; sulfa antibiotics; tape; welchol [colesevelam hcl]; and zocor [simvastatin].  MEDICATIONS:  Current Outpatient Prescriptions  Medication Sig Dispense Refill  . aspirin EC 81 MG EC tablet Take 1 tablet (81 mg total) by mouth daily.    . calcium carbonate (OS-CAL) 600 MG TABS Take 600 mg by mouth 2 (two) times daily with a meal.    . carboxymethylcellulose (REFRESH PLUS) 0.5 % SOLN Place 1 drop into both eyes 4 (four) times daily.     . cephALEXin (KEFLEX) 250 MG capsule Take 250 mg by mouth at bedtime.     . Cholecalciferol (VITAMIN D) 2000 UNITS tablet Take 2,000 Units by mouth 2 (two) times daily.     . clopidogrel (PLAVIX) 75 MG tablet TAKE 1 TABLET BY MOUTH  DAILY 90 tablet 2  . dexlansoprazole (DEXILANT) 60 MG capsule Take 60 mg by mouth daily.    Marland Kitchen escitalopram (LEXAPRO) 20 MG tablet Take 20 mg by mouth daily.    Marland Kitchen ezetimibe (ZETIA) 10 MG tablet Take 1 tablet (10 mg total) by mouth daily. 90 tablet 3  . fexofenadine (ALLEGRA) 180 MG tablet Take 180 mg by mouth daily.    Marland Kitchen gabapentin (NEURONTIN) 300 MG capsule TAKE 1 CAPSULE BY MOUTH 3  TIMES DAILY 270 capsule 1  . LORazepam (ATIVAN) 1 MG tablet Take 0.5-1 mg by mouth 2 (two) times daily as needed for anxiety. For sleeping per patient    . lovastatin (MEVACOR) 40 MG tablet Take 1 tablet (40 mg total) by mouth at bedtime. 90 tablet 3  . meclizine (ANTIVERT) 25 MG tablet Take 12.5-25 mg by mouth 3 (three) times daily as needed for dizziness.    . nitroGLYCERIN (NITROSTAT) 0.4 MG SL tablet Place 1 tablet (0.4 mg total) under the tongue every 5 (five) minutes as needed for chest pain (CP or SOB). 25 tablet 3  . ranitidine (ZANTAC) 150 MG tablet Take 150 mg by mouth at bedtime.    . sodium chloride (OCEAN) 0.65 % SOLN nasal spray Place 2 sprays into both nostrils as needed for congestion.     No current facility-administered  medications for this visit.     PHYSICAL EXAMINATION: ECOG PERFORMANCE STATUS: 1 - Symptomatic but completely ambulatory  Vitals:   02/16/17 1510  BP: 127/69  Pulse: 90  Resp: 18  Temp: 99.1 F (37.3 C)  SpO2: 97%   Filed Weights   02/16/17 1510  Weight: 181 lb 9.6 oz (82.4 kg)    GENERAL:alert, no distress and comfortable SKIN: skin color, texture, turgor are normal, no rashes or significant lesions EYES: normal, Conjunctiva are pink and non-injected, sclera clear OROPHARYNX:no exudate, no erythema and lips, buccal mucosa, and tongue normal  NECK: supple, thyroid normal size, non-tender, without nodularity LYMPH:  no palpable lymphadenopathy in the cervical, axillary or inguinal LUNGS: clear to auscultation and percussion with normal breathing effort HEART: regular rate & rhythm and no murmurs and no lower extremity edema ABDOMEN:abdomen soft, non-tender and normal bowel sounds MUSCULOSKELETAL:no cyanosis of digits and no clubbing  NEURO: alert & oriented x 3 with fluent speech, no focal motor/sensory deficits EXTREMITIES: No lower  extremity edema  LABORATORY DATA:  I have reviewed the data as listed   Chemistry      Component Value Date/Time   NA 140 03/23/2016 1414   NA 141 03/16/2016 1006   K 4.5 03/23/2016 1414   K 4.3 03/16/2016 1006   CL 100 (L) 03/23/2016 1414   CO2 31 03/23/2016 1414   CO2 31 (H) 03/16/2016 1006   BUN 8 03/23/2016 1414   BUN 9.6 03/16/2016 1006   CREATININE 0.82 03/23/2016 1414   CREATININE 0.9 03/16/2016 1006      Component Value Date/Time   CALCIUM 9.2 03/23/2016 1414   CALCIUM 9.3 03/16/2016 1006   ALKPHOS 77 09/19/2016 0942   ALKPHOS 96 03/16/2016 1006   AST 17 09/19/2016 0942   AST 15 03/16/2016 1006   ALT 10 09/19/2016 0942   ALT 11 03/16/2016 1006   BILITOT 0.4 09/19/2016 0942   BILITOT 0.63 03/16/2016 1006       Lab Results  Component Value Date   WBC 6.5 03/23/2016   HGB 12.2 03/23/2016   HCT 38.0 03/23/2016    MCV 92.0 03/23/2016   PLT 207 03/23/2016   NEUTROABS 4.5 03/16/2016    ASSESSMENT & PLAN:  Iron deficiency anemia 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 Patient received blood transfusion at [facility].  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  I discussed with her that she may need periodic IV iron infusions. I will see the patient back every 3 months with recheck of her labs and follow-up.   Breast cancer: Currently in remission.  I spent 25 minutes talking to the patient of which more than half was spent in counseling and coordination of care.  Orders Placed This Encounter  Procedures  . Iron and TIBC    Standing Status:   Future    Standing Expiration Date:   02/16/2018  . Ferritin    Standing Status:   Future    Standing Expiration Date:   02/16/2018  . CBC with Differential    Standing Status:   Future    Standing Expiration Date:   02/16/2018   The patient has a good understanding of the overall plan. she agrees with it. she will call with any problems that may develop before the next visit here.   Rulon Eisenmenger, MD 02/16/17

## 2017-02-16 NOTE — Telephone Encounter (Signed)
Appt has been scheduled for the pt to see Dr. Lindi Adie today at 315pm. This is a referral from Dr. Marisue Humble for persistent IDA. Pt has agreed to the appt date and time.

## 2017-02-16 NOTE — Telephone Encounter (Signed)
Lft the pt a vm to schedule an appt with Dr. Lindi Adie.

## 2017-02-16 NOTE — Patient Instructions (Signed)

## 2017-02-16 NOTE — Assessment & Plan Note (Signed)
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 Patient received blood transfusion at [facility].  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  I discussed with her that she may need periodic IV iron infusions. I will see the patient back every 3 months with recheck of her labs and follow-up.

## 2017-02-17 ENCOUNTER — Telehealth: Payer: Self-pay | Admitting: Hematology and Oncology

## 2017-02-17 NOTE — Telephone Encounter (Signed)
Left message for patient regarding upcoming 10/12 appointment. Mailed calendar of upcoming appointments.

## 2017-02-19 ENCOUNTER — Other Ambulatory Visit: Payer: Self-pay | Admitting: Oncology

## 2017-02-24 ENCOUNTER — Ambulatory Visit (HOSPITAL_BASED_OUTPATIENT_CLINIC_OR_DEPARTMENT_OTHER): Payer: Medicare Other

## 2017-02-24 VITALS — BP 123/76 | HR 64 | Temp 98.7°F | Resp 18

## 2017-02-24 DIAGNOSIS — D508 Other iron deficiency anemias: Secondary | ICD-10-CM

## 2017-02-24 DIAGNOSIS — D509 Iron deficiency anemia, unspecified: Secondary | ICD-10-CM

## 2017-02-24 MED ORDER — SODIUM CHLORIDE 0.9 % IV SOLN
510.0000 mg | Freq: Once | INTRAVENOUS | Status: AC
  Administered 2017-02-24: 510 mg via INTRAVENOUS
  Filled 2017-02-24: qty 17

## 2017-02-24 MED ORDER — SODIUM CHLORIDE 0.9 % IV SOLN
Freq: Once | INTRAVENOUS | Status: AC
  Administered 2017-02-24: 14:00:00 via INTRAVENOUS

## 2017-02-24 NOTE — Progress Notes (Signed)
OK to treat with fereheme today despite elevated temp per Dr Lindi Adie.  Order repeated & verified.

## 2017-02-24 NOTE — Patient Instructions (Signed)

## 2017-03-16 ENCOUNTER — Encounter: Payer: Self-pay | Admitting: Hematology and Oncology

## 2017-03-16 ENCOUNTER — Ambulatory Visit (HOSPITAL_BASED_OUTPATIENT_CLINIC_OR_DEPARTMENT_OTHER): Payer: Medicare Other | Admitting: Hematology and Oncology

## 2017-03-16 DIAGNOSIS — D509 Iron deficiency anemia, unspecified: Secondary | ICD-10-CM

## 2017-03-16 DIAGNOSIS — C50411 Malignant neoplasm of upper-outer quadrant of right female breast: Secondary | ICD-10-CM

## 2017-03-16 DIAGNOSIS — N6323 Unspecified lump in the left breast, lower outer quadrant: Secondary | ICD-10-CM | POA: Diagnosis not present

## 2017-03-16 DIAGNOSIS — Z170 Estrogen receptor positive status [ER+]: Secondary | ICD-10-CM

## 2017-03-16 DIAGNOSIS — D508 Other iron deficiency anemias: Secondary | ICD-10-CM

## 2017-03-16 DIAGNOSIS — Z853 Personal history of malignant neoplasm of breast: Secondary | ICD-10-CM

## 2017-03-16 NOTE — Assessment & Plan Note (Signed)
ron deficiency anemia due to malabsorption: Iron studies done at [facility] show 3% iron saturation and ferritin of 10 with an MCV of 67 and a hemoglobin of 8.1. Patient received blood transfusion at [place] given on 02/17/2017 x 2 doses  Return to clinic in 3 months for lab and follow-up

## 2017-03-16 NOTE — Assessment & Plan Note (Deleted)
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 Patient received blood transfusion at [place] given on 02/17/2017 x 2 doses  Return to clinic in 3 months for lab and follow-up

## 2017-03-16 NOTE — Progress Notes (Signed)
Patient Care Team: Gaynelle Arabian, MD as PCP - General (Family Medicine) Jerline Pain, MD as Consulting Physician (Cardiology) Wonda Horner, MD as Consulting Physician (Gastroenterology) Ralene Ok, MD as Consulting Physician (Surgery) Nicholas Lose, MD as Consulting Physician (Hematology and Oncology) Sylvan Cheese, NP as Nurse Practitioner (Hematology and Oncology) Jovita Kussmaul, MD as Consulting Physician (General Surgery) Thea Silversmith, MD (Inactive) as Consulting Physician (Radiation Oncology)  DIAGNOSIS:  Encounter Diagnoses  Name Primary?  . Malignant neoplasm of upper-outer quadrant of right breast in female, estrogen receptor positive ([place])   . Iron deficiency anemia secondary to inadequate dietary iron intake     SUMMARY OF ONCOLOGIC HISTORY:   Breast cancer of upper-outer quadrant of right female breast ([place])   07/30/2014 Mammogram    Right breast: Mass at 10 o'clock location 4 cm from the nipple measuring 1.7 x 1.3 x 1.3 cm. This corresponds to the palpable and mammographic finding. In the right axilla, there are several lymph nodes which are normal in size measuring up to 85m.      08/27/2014 Initial Biopsy    Right breast core needle bx: Invasive ductal carcinoma with LVI, grade 3, ER- (0%), PR- (0%), HER2/neu negative, Ki67 30%      08/27/2014 Clinical Stage    Stage IIA: T2 N0      09/22/2014 Definitive Surgery    Right lumpectomy/SLNB (Marlou Starks: Invasive ductal carcinoma, grade 3, 2.6 cm, high grade DCIS, LVI present, ER-, PR-, 2 LN removed and 1 positive for metastatic carcinoma (1/2)      09/22/2014 Pathologic Stage    Stage IIB: T2 N1 M0      10/15/2014 - 12/16/2014 Chemotherapy    Adjuvant chemotherapy with Taxotere Cytoxan 4 q3 weeks      10/21/2014 - 10/24/2014 Hospital Admission    Admission for neutropenic fever UTI Klebsiella      01/26/2015 - 03/11/2015 Radiation Therapy    Adjuvant RT (Pablo Ledger: Right breast / 45 Gray @ 1.8  GPearline Cablesper fraction x 25 fractions Right supraclavicular fossa/PAB 45 Gy '@1' .8 Gy per fraction x 25 fractions Right breast boost / 16 Gray at 2[employer]per fraction x 8 fractions      05/19/2015 Survivorship    Survivorship care plan visit completed.       CHIEF COMPLIANT: Urgent visit for a palpable lump in the left breast  INTERVAL HISTORY: Angela DWIGGINSis a 71year old lady with above-mentioned history of right breast cancer who is in remission and came in today for urgent visit because she recently fell and had extensive bruising and since then she felt a small lump in the left lateral lower outer quadrant of the left breast.  There was some bruising accompanying this. Last week she received IV iron treatment and had done extremely well.  She tells me that her energy levels are improving.  REVIEW OF SYSTEMS:   Constitutional: Denies fevers, chills or abnormal weight loss Eyes: Denies blurriness of vision Ears, nose, mouth, throat, and face: Denies mucositis or sore throat Respiratory: Denies cough, dyspnea or wheezes Cardiovascular: Denies palpitation, chest discomfort Gastrointestinal:  Denies nausea, heartburn or change in bowel habits Skin: Denies abnormal skin rashes Lymphatics: Denies new lymphadenopathy or easy bruising Neurological:Denies numbness, tingling or new weaknesses Behavioral/Psych: Mood is stable, no new changes  Extremities: No lower extremity edema Breast: Left breast lump All other systems were reviewed with the patient and are negative.  I have reviewed the past medical history, past surgical  history, social history and family history with the patient and they are unchanged from previous note.  ALLERGIES:  is allergic to shellfish allergy; lactose intolerance (gi); brilinta [ticagrelor]; nitrofurantoin; penicillins; aleve [naproxen]; aspirin; ciprofloxacin; doxycycline; evista [raloxifene]; fosamax [alendronate sodium]; latex; naproxen sodium; pravachol  [pravastatin]; sulfa antibiotics; tape; welchol [colesevelam hcl]; and zocor [simvastatin].  MEDICATIONS:  Current Outpatient Prescriptions  Medication Sig Dispense Refill  . aspirin EC 81 MG EC tablet Take 1 tablet (81 mg total) by mouth daily.    . calcium carbonate (OS-CAL) 600 MG TABS Take 600 mg by mouth 2 (two) times daily with a meal.    . carboxymethylcellulose (REFRESH PLUS) 0.5 % SOLN Place 1 drop into both eyes 4 (four) times daily.     . cephALEXin (KEFLEX) 250 MG capsule Take 250 mg by mouth at bedtime.     . Cholecalciferol (VITAMIN D) 2000 UNITS tablet Take 2,000 Units by mouth 2 (two) times daily.     . clopidogrel (PLAVIX) 75 MG tablet TAKE 1 TABLET BY MOUTH  DAILY 90 tablet 2  . dexlansoprazole (DEXILANT) 60 MG capsule Take 60 mg by mouth daily.    Marland Kitchen escitalopram (LEXAPRO) 20 MG tablet Take 20 mg by mouth daily.    Marland Kitchen ezetimibe (ZETIA) 10 MG tablet Take 1 tablet (10 mg total) by mouth daily. 90 tablet 3  . fexofenadine (ALLEGRA) 180 MG tablet Take 180 mg by mouth daily.    Marland Kitchen gabapentin (NEURONTIN) 300 MG capsule TAKE 1 CAPSULE BY MOUTH 3  TIMES DAILY 270 capsule 1  . LORazepam (ATIVAN) 1 MG tablet Take 0.5-1 mg by mouth 2 (two) times daily as needed for anxiety. For sleeping per patient    . lovastatin (MEVACOR) 40 MG tablet Take 1 tablet (40 mg total) by mouth at bedtime. 90 tablet 3  . meclizine (ANTIVERT) 25 MG tablet Take 12.5-25 mg by mouth 3 (three) times daily as needed for dizziness.    . nitroGLYCERIN (NITROSTAT) 0.4 MG SL tablet Place 1 tablet (0.4 mg total) under the tongue every 5 (five) minutes as needed for chest pain (CP or SOB). 25 tablet 3  . ranitidine (ZANTAC) 150 MG tablet Take 150 mg by mouth at bedtime.    . sodium chloride (OCEAN) 0.65 % SOLN nasal spray Place 2 sprays into both nostrils as needed for congestion.     No current facility-administered medications for this visit.     PHYSICAL EXAMINATION: ECOG PERFORMANCE STATUS: 1 - Symptomatic but  completely ambulatory  Vitals:   03/16/17 1034  BP: 111/62  Pulse: 79  Resp: 17  Temp: 98.3 F (36.8 C)  SpO2: 99%   Filed Weights   03/16/17 1034  Weight: 182 lb 11.2 oz (82.9 kg)    GENERAL:alert, no distress and comfortable SKIN: skin color, texture, turgor are normal, no rashes or significant lesions EYES: normal, Conjunctiva are pink and non-injected, sclera clear OROPHARYNX:no exudate, no erythema and lips, buccal mucosa, and tongue normal  NECK: supple, thyroid normal size, non-tender, without nodularity LYMPH:  no palpable lymphadenopathy in the cervical, axillary or inguinal LUNGS: clear to auscultation and percussion with normal breathing effort HEART: regular rate & rhythm and no murmurs and no lower extremity edema ABDOMEN:abdomen soft, non-tender and normal bowel sounds MUSCULOSKELETAL:no cyanosis of digits and no clubbing  NEURO: alert & oriented x 3 with fluent speech, no focal motor/sensory deficits EXTREMITIES: No lower extremity edema BREAST: Small palpable lump in the left breast 2 cm in size accompanied by bruising  most likely hematoma. (exam performed in the presence of a chaperone)  LABORATORY DATA:  I have reviewed the data as listed   Chemistry      Component Value Date/Time   NA 140 03/23/2016 1414   NA 141 03/16/2016 1006   K 4.5 03/23/2016 1414   K 4.3 03/16/2016 1006   CL 100 (L) 03/23/2016 1414   CO2 31 03/23/2016 1414   CO2 31 (H) 03/16/2016 1006   BUN 8 03/23/2016 1414   BUN 9.6 03/16/2016 1006   CREATININE 0.82 03/23/2016 1414   CREATININE 0.9 03/16/2016 1006      Component Value Date/Time   CALCIUM 9.2 03/23/2016 1414   CALCIUM 9.3 03/16/2016 1006   ALKPHOS 77 09/19/2016 0942   ALKPHOS 96 03/16/2016 1006   AST 17 09/19/2016 0942   AST 15 03/16/2016 1006   ALT 10 09/19/2016 0942   ALT 11 03/16/2016 1006   BILITOT 0.4 09/19/2016 0942   BILITOT 0.63 03/16/2016 1006       Lab Results  Component Value Date   WBC 6.5  03/23/2016   HGB 12.2 03/23/2016   HCT 38.0 03/23/2016   MCV 92.0 03/23/2016   PLT 207 03/23/2016   NEUTROABS 4.5 03/16/2016    ASSESSMENT & PLAN:  Iron deficiency anemia ron deficiency anemia due to malabsorption: Iron studies done at [place] show 3% iron saturation and ferritin of 10 with an MCV of 67 and a hemoglobin of 8.1. Patient received blood transfusion at [place] given on 02/17/2017 x 2 doses  Return to clinic in 3 months for lab and follow-up   Breast cancer of upper-outer quadrant of right female breast Patient felt a knot in the left breast. Examination reveals a small nodule that is freely mobile. Recommendation: Ultrasound evaluation and a mammogram Return to clinic based upon those findings.   I spent 25 minutes talking to the patient of which more than half was spent in counseling and coordination of care.  Orders Placed This Encounter  Procedures  . US BREAST LTD UNI LEFT INC AXILLA    Standing Status:   Future    Standing Expiration Date:   05/16/2018    Order Specific Question:   Reason for Exam (SYMPTOM  OR DIAGNOSIS REQUIRED)    Answer:   palbable lump left breast    Order Specific Question:   Preferred imaging location?    Answer:   [facility]  . MM DIAG BREAST TOMO UNI LEFT    Standing Status:   Future    Standing Expiration Date:   05/16/2018    Order Specific Question:   Reason for Exam (SYMPTOM  OR DIAGNOSIS REQUIRED)    Answer:   left breast lump    Order Specific Question:   Preferred imaging location?    Answer:   [facility]   The patient has a good understanding of the overall plan. she agrees with it. she will call with any problems that may develop before the next visit here.   Rulon Eisenmenger, MD 03/16/17

## 2017-03-16 NOTE — Assessment & Plan Note (Signed)
Patient felt a knot in the left breast. Examination reveals a small nodule that is freely mobile. Recommendation: Ultrasound evaluation and a mammogram Return to clinic based upon those findings.

## 2017-03-20 ENCOUNTER — Ambulatory Visit
Admission: RE | Admit: 2017-03-20 | Discharge: 2017-03-20 | Disposition: A | Discharge status: Home / Self Care | Payer: Medicare Other | Source: Ambulatory Visit | Attending: Hematology and Oncology | Admitting: Hematology and Oncology

## 2017-03-20 ENCOUNTER — Other Ambulatory Visit: Payer: Self-pay | Admitting: Hematology and Oncology

## 2017-03-20 DIAGNOSIS — C50411 Malignant neoplasm of upper-outer quadrant of right female breast: Secondary | ICD-10-CM

## 2017-03-20 DIAGNOSIS — N641 Fat necrosis of breast: Secondary | ICD-10-CM

## 2017-03-20 DIAGNOSIS — Z170 Estrogen receptor positive status [ER+]: Secondary | ICD-10-CM

## 2017-03-21 ENCOUNTER — Encounter: Payer: Self-pay | Admitting: Nurse Practitioner

## 2017-03-21 ENCOUNTER — Ambulatory Visit (INDEPENDENT_AMBULATORY_CARE_PROVIDER_SITE_OTHER): Payer: Medicare Other | Admitting: Nurse Practitioner

## 2017-03-21 VITALS — BP 120/80 | HR 68 | Ht 60.0 in | Wt 182.8 lb

## 2017-03-21 DIAGNOSIS — R609 Edema, unspecified: Secondary | ICD-10-CM | POA: Diagnosis not present

## 2017-03-21 DIAGNOSIS — E7800 Pure hypercholesterolemia, unspecified: Secondary | ICD-10-CM

## 2017-03-21 DIAGNOSIS — I2510 Atherosclerotic heart disease of native coronary artery without angina pectoris: Secondary | ICD-10-CM

## 2017-03-21 DIAGNOSIS — I2583 Coronary atherosclerosis due to lipid rich plaque: Secondary | ICD-10-CM

## 2017-03-21 DIAGNOSIS — I252 Old myocardial infarction: Secondary | ICD-10-CM | POA: Diagnosis not present

## 2017-03-21 DIAGNOSIS — I10 Essential (primary) hypertension: Secondary | ICD-10-CM

## 2017-03-21 MED ORDER — FUROSEMIDE 20 MG PO TABS: 20.0000 mg | ORAL_TABLET | Freq: Every day | ORAL | 6 refills | Status: DC

## 2017-03-21 NOTE — Progress Notes (Signed)
CARDIOLOGY OFFICE NOTE  Date:  03/21/2017    Angela Bond Date of Birth: 1946-01-04 Medical Record #[id number]  PCP:  Gaynelle Arabian, MD  Cardiologist:  Marisa Cyphers    Chief Complaint  Patient presents with  . Coronary Artery Disease    6 month check - seen for Dr. Marlou Porch    History of Present Illness: Angela Bond is a 71 y.o. female who presents today for a 6 month check. Seen for Dr. Marlou Porch.   She has a history of CAD s/p NSTEMI in June 2017 with DES to mid LAD, HTN, orthostatic hypotension, HLD,and anxiety.   At the time of her cardiac cath - there was residual disease in her proximal RCA (30%) and 35% in his ostial 2nd diagonal. She was placed on DAPT with ASA and Brilinta which was switched to Plavix outpatient for cost reasons. Of note, she was seen in the ED on 11/10/15 for chest pain 4 days after she was discharged from her NSTEMI. A GI cocktail relieved her pain in the ED and she was discharged from the ED. Her lisinopril was discontinued in July of this year as she had some orthostatic hypotension in the office. She reported compliance with medical therapy.   During one cardiac rehab session, she was hypotensive with BP of 88/60 post exercise. She was given a Gatorade and her BP rose to 94/58. She was dizzy and diaphoretic at the time. She called our office and her beta blocker (metoprolol 12.5 mg BID) was discontinued. She was at cardiac rehab again and developed left sided chest pain with radiation to her neck and shoulder with associated SOB, diaphoresis and nausea, somewhat reminiscent of prior angina. EKG during pain showed no acute ST/T changes. Initial troponin was negative.Vitals were stable with BP 142/98 (mildly hypertensive). CXR NAD. Dr. Burt Knack felt her symptoms were difficult to sort out. Her PCI was just performed about 2 months ago and he felt it was unlikely that she has restenosis or new coronary plaque rupture as she has been compliant with  medical therapy. Labs otherwise notable for Hgb 10.7 (c/w prior). Per notes from our team, the patient had troponin rechecked down in nuc med and it was negative. She underwent inpatient stress test. This showed septal scar/infarction, no findings for ischemia, EF 68%. It was recommended to try her back on a nightly dose of Toprol 12.5mg  daily but she was unable to take..   She did not wish to take atorvastatin, Crestor was too expensive. She is now on lovastatin in doing well with this. She was reassured by the nuclear stress test that was low risk with no ischemia.  Last seen back in July of 2018 by Dr. Marlou Porch and felt to be doing well - there was a concern for scabies.   Comes in today. Here with her husband. The room smells of urine. She feels like she is doing ok. Still with some DOE - may be a little worse. Finished with rehab but really not staying active. Has joined [employer] - but not going. No chest pain. She is fasting today. Tolerating her medicines. Probably gets too much salt. Complains of more swelling in her legs.   Past Medical History:  Diagnosis Date  . Anemia    hx  . Anxiety and depression   . Arthritis    "~ all over my body; in the joints"  . Breast cancer, right ([place]) 3/16  . Coronary artery disease  a.  s/p NSTEMI in June 2017 with DES to mid LAD.  Marland Kitchen Depression   . GERD (gastroesophageal reflux disease)   . H/O hiatal hernia   . Heart murmur   . History of blood transfusion    "when taking chemo & w/one of my knee replacements" (11/05/2015)  . Hypercholesteremia   . Hypertension   . Orthostatic hypotension   . Personal history of chemotherapy   . Personal history of radiation therapy   . Pneumonia X 1  . Shingles 5/15   hx  . Shortness of breath    "stomach is in lungs" when had hernia 2014 none now(07/28/14)  . Sinus headache     Past Surgical History:  Procedure Laterality Date  . BACK SURGERY    . BREAST BIOPSY Right   . BREAST LUMPECTOMY  Right 08/2014  . CARDIAC CATHETERIZATION    . CARPAL TUNNEL RELEASE Right 09/2015  . CORONARY ANGIOPLASTY WITH STENT PLACEMENT  11/05/2015   "1 stent"  . EYE SURGERY Left 2013   tube placed tear duct  . HERNIA REPAIR    . JOINT REPLACEMENT    . PORT-A-CATH REMOVAL  06/2015  . POSTERIOR LUMBAR FUSION  2016   "had 2 vertebrae fused"  . TONSILLECTOMY  as child  . TOTAL KNEE ARTHROPLASTY Bilateral 2011,2012     Medications: Current Meds  Medication Sig  . aspirin EC 81 MG EC tablet Take 1 tablet (81 mg total) by mouth daily.  . calcium carbonate (OS-CAL) 600 MG TABS Take 600 mg by mouth 2 (two) times daily with a meal.  . carboxymethylcellulose (REFRESH PLUS) 0.5 % SOLN Place 1 drop into both eyes 4 (four) times daily.   . cephALEXin (KEFLEX) 250 MG capsule Take 250 mg by mouth at bedtime.   . Cholecalciferol (VITAMIN D) 2000 UNITS tablet Take 2,000 Units by mouth 2 (two) times daily.   . clopidogrel (PLAVIX) 75 MG tablet TAKE 1 TABLET BY MOUTH  DAILY  . dexlansoprazole (DEXILANT) 60 MG capsule Take 60 mg by mouth daily.  Marland Kitchen escitalopram (LEXAPRO) 20 MG tablet Take 20 mg by mouth daily.  . fexofenadine (ALLEGRA) 180 MG tablet Take 180 mg by mouth daily.  Marland Kitchen gabapentin (NEURONTIN) 300 MG capsule TAKE 1 CAPSULE BY MOUTH 3  TIMES DAILY  . LORazepam (ATIVAN) 1 MG tablet Take 0.5-1 mg by mouth 2 (two) times daily as needed for anxiety. For sleeping per patient  . lovastatin (MEVACOR) 40 MG tablet Take 1 tablet (40 mg total) by mouth at bedtime.  . meclizine (ANTIVERT) 25 MG tablet Take 12.5-25 mg by mouth 3 (three) times daily as needed for dizziness.  . nitroGLYCERIN (NITROSTAT) 0.4 MG SL tablet Place 1 tablet (0.4 mg total) under the tongue every 5 (five) minutes as needed for chest pain (CP or SOB).  . ranitidine (ZANTAC) 150 MG tablet Take 150 mg by mouth at bedtime.  . sodium chloride (OCEAN) 0.65 % SOLN nasal spray Place 2 sprays into both nostrils as needed for congestion.      Allergies: Allergies  Allergen Reactions  . Shellfish Allergy Anaphylaxis    ANAPHYLAXIS TO OYSTERS  . Lactose Intolerance (Gi) Other (See Comments)  . Brilinta [Ticagrelor]     Other reaction(s): Other (See Comments) Indigestion Indigestion  . Nitrofurantoin     Other reaction(s): Other (See Comments) "broke out down there" "broke out down there"  . Penicillins Nausea And Vomiting    Has patient had a PCN reaction causing immediate  rash, facial/tongue/throat swelling, SOB or lightheadedness with hypotension: Yes Has patient had a PCN reaction causing severe rash involving mucus membranes or skin necrosis: No Has patient had a PCN reaction that required hospitalization No Has patient had a PCN reaction occurring within the last 10 years: No If all of the above answers are "NO", then may proceed with Cephalosporin use.   [name] [Naproxen] Nausea And Vomiting  . Aspirin Other (See Comments)    Burns stomach  . Ciprofloxacin Nausea And Vomiting  . Doxycycline Nausea And Vomiting  . Evista [Raloxifene] Other (See Comments)    LEG ACHES  . Fosamax [Alendronate Sodium] Other (See Comments)    Leg aches  . Latex Rash  . Naproxen Sodium Nausea And Vomiting  . Pravachol [Pravastatin] Other (See Comments)    GI upset   . Sulfa Antibiotics Nausea Only  . Tape Rash  . Welchol [Colesevelam Hcl] Other (See Comments)    Stomach cramps  . Zocor [Simvastatin] Rash    Social History: The patient  reports that  has never smoked. she has never used smokeless tobacco. She reports that she does not drink alcohol or use drugs.   Family History: The patient's family history includes Cancer in her father and sister; Diabetes in her father and sister; Heart disease in her father, mother, and sister.   Review of Systems: Please see the history of present illness.   Otherwise, the review of systems is positive for none.   All other systems are reviewed and negative.   Physical  Exam: VS:  BP 120/80 (BP Location: Left Arm, Patient Position: Sitting, Cuff Size: Normal)   Pulse 68   Ht 5' (1.524 m)   Wt 182 lb 12.8 oz (82.9 kg)   BMI 35.70 kg/m  .  BMI Body mass index is 35.7 kg/m.  Wt Readings from Last 3 Encounters:  03/21/17 182 lb 12.8 oz (82.9 kg)  03/16/17 182 lb 11.2 oz (82.9 kg)  02/16/17 181 lb 9.6 oz (82.4 kg)    General:  Obese. Alert and in no acute distress.  Her weight is up from 173 since May. She smells of urine. She has what may be some bite/small ulcerations noted.  HEENT: Normal.  Neck: Supple, no JVD, carotid bruits, or masses noted.  Cardiac: Regular rate and rhythm. No murmurs, rubs, or gallops. +1 edema.  Respiratory:  Lungs are clear to auscultation bilaterally with normal work of breathing.  GI: Soft and nontender.  MS: No deformity or atrophy. Gait and ROM intact.  Skin: Warm and dry. Color is normal.  Neuro:  Strength and sensation are intact and no gross focal deficits noted.  Psych: Alert, appropriate and with normal affect.   LABORATORY DATA:  EKG:  EKG is ordered today. This demonstrates NSR.  Lab Results  Component Value Date   WBC 6.5 03/23/2016   HGB 12.2 03/23/2016   HCT 38.0 03/23/2016   PLT 207 03/23/2016   GLUCOSE 89 03/23/2016   CHOL 129 09/19/2016   TRIG 117 09/19/2016   HDL 57 09/19/2016   LDLCALC 49 09/19/2016   ALT 10 09/19/2016   AST 17 09/19/2016   NA 140 03/23/2016   K 4.5 03/23/2016   CL 100 (L) 03/23/2016   CREATININE 0.82 03/23/2016   BUN 8 03/23/2016   CO2 31 03/23/2016   TSH 1.640 09/20/2013   INR 1.34 12/24/2014   HGBA1C 5.5 11/05/2015       BNP (last 3 results) No results for  input(s): BNP in the last 8760 hours.  ProBNP (last 3 results) No results for input(s): PROBNP in the last 8760 hours.   Other Studies Reviewed Today:  Cath 11/05/15: 1. Prox RCA to Mid RCA lesion, 30% stenosed. 2. Mid LAD lesion, 99% stenosed. Post intervention, there is a 5% residual  stenosis. 3. Ost 2nd Diag lesion, 35% stenosed.   Acute coronary syndrome caused by acute/subacute thrombotic obstruction of the proximal LAD forming a [place] 111 bifurcation lesion with the large diagonal #1.  Luminal irregularities are noted in the circumflex and right coronary.  Severe anteroapical hypokinesis with estimated ejection fraction 35-40%. LV end-diastolic pressure is normal.  Successful PTCA and stent of the LAD thrombus containing lesion with reduction in the 95% stenosis to 0% with TIMI grade 3 flow using a drug-eluting stent postdilated to 3.5 mm in diameter. The diagonal branch though "jailed" ended with less than 40% ostial obstruction.  RECOMMENDATIONS: 1. Probable discharge in a.m. assuming no intercurrent complications. 2. Low dose Ace and beta blocker therapy were started due to LV dysfunction in the setting of acute coronary syndrome. Suspect the left ventricular function will significantly improved and of the wall motion abnormality is related to "stunned" myocardium. 3. High intensity statin therapy was ordered although the patient has many potential allergies/intolerances.   NUC stress 01/16/16: 1. Septal scar/infarction. No findings for ischemia.  2. Septal wall motion abnormality and bulging of the inferoseptal wall with contraction.  3. Left ventricular ejection fraction 68%  4. Non invasive risk stratification*: Intermediate to low risk.  Assessment/Plan:   1. Swelling - EF was down by cath but better by echo and by repeat Myoview - I suspect she is getting too much salt. Adding Lasix 20 mg a day. Lab today and again in 2 weeks - needs to restrict her salt.   2. CAD - no active chest pain - prior DES to the mid LAD - has residual disease - continue with current regimen. Needs CV risk factor modification.   3. HTN - BP ok on current reigmen  4. HLD - lab today - on statin therapy  5. History of orthostasis - currently not an issue  6.  Obesity  7. Skin lesions - still concerning  Current medicines are reviewed with the patient today.  The patient does not have concerns regarding medicines other than what has been noted above.  The following changes have been made:  See above.  Labs/ tests ordered today include:    Orders Placed This Encounter  Procedures  . Basic metabolic panel  . EKG 12-Lead     Disposition:   FU with Dr. Marlou Porch in 3 months.    Patient is agreeable to this plan and will call if any problems develop in the interim.   SignedTruitt Merle, NP  03/21/2017 9:29 AM  [facility] [address] [place], [place]  [address] Phone: [phone] Fax: [phone]

## 2017-03-21 NOTE — Patient Instructions (Addendum)
We will be checking the following labs today - BMET, BNP, CBC, HPF and lipids   Medication Instructions:    Continue with your current medicines. BUT  I am adding Lasix 20 mg a day - this will help with the swelling    Testing/Procedures To Be Arranged:  N/A  Follow-Up:   See Dr. Marlou Porch in about 3 months    Other Special Instructions:   Lab in 2 weeks - BMET    If you need a refill on your cardiac medications before your next appointment, please call your pharmacy.   Call the [place] office at [phone] if you have any questions, problems or concerns.

## 2017-03-22 LAB — BASIC METABOLIC PANEL
BUN/Creatinine Ratio: 15 (ref 12–28)
BUN: 14 mg/dL (ref 8–27)
CO2: 30 mmol/L — ABNORMAL HIGH (ref 20–29)
Calcium: 9 mg/dL (ref 8.7–10.3)
Chloride: 99 mmol/L (ref 96–106)
Creatinine, Ser: 0.91 mg/dL (ref 0.57–1.00)
GFR calc Af Amer: 73 mL/min/{1.73_m2} (ref >59)
GFR calc non Af Amer: 63 mL/min/{1.73_m2} (ref >59)
Glucose: 89 mg/dL (ref 65–99)
Potassium: 4 mmol/L (ref 3.5–5.2)
Sodium: 141 mmol/L (ref 134–144)

## 2017-03-22 LAB — HEPATIC FUNCTION PANEL
ALT: 11 IU/L (ref 0–32)
AST: 18 IU/L (ref 0–40)
Albumin: 4 g/dL (ref 3.5–4.8)
Alkaline Phosphatase: 70 IU/L (ref 39–117)
Bilirubin Total: 0.4 mg/dL (ref 0.0–1.2)
Bilirubin, Direct: 0.15 mg/dL (ref 0.00–0.40)
Total Protein: 7.1 g/dL (ref 6.0–8.5)

## 2017-03-22 LAB — CBC
Hematocrit: 46 % (ref 34.0–46.6)
Hemoglobin: 15.2 g/dL (ref 11.1–15.9)
MCH: 27.1 pg (ref 26.6–33.0)
MCHC: 33 g/dL (ref 31.5–35.7)
MCV: 82 fL (ref 79–97)
Platelets: 146 10*3/uL — ABNORMAL LOW (ref 150–379)
RBC: 5.6 x10E6/uL — ABNORMAL HIGH (ref 3.77–5.28)
WBC: 5 10*3/uL (ref 3.4–10.8)

## 2017-03-22 LAB — LIPID PANEL
Chol/HDL Ratio: 2.3 ratio (ref 0.0–4.4)
Cholesterol, Total: 152 mg/dL (ref 100–199)
HDL: 66 mg/dL (ref >39)
LDL Calculated: 69 mg/dL (ref 0–99)
Triglycerides: 84 mg/dL (ref 0–149)
VLDL Cholesterol Cal: 17 mg/dL (ref 5–40)

## 2017-03-22 LAB — PRO B NATRIURETIC PEPTIDE: NT-Pro BNP: 177 pg/mL (ref 0–301)

## 2017-04-04 ENCOUNTER — Other Ambulatory Visit: Payer: Self-pay | Admitting: Cardiology

## 2017-04-04 ENCOUNTER — Other Ambulatory Visit: Payer: Medicare Other | Admitting: *Deleted

## 2017-04-04 ENCOUNTER — Telehealth: Payer: Self-pay

## 2017-04-04 ENCOUNTER — Other Ambulatory Visit: Payer: Self-pay | Admitting: Nurse Practitioner

## 2017-04-04 DIAGNOSIS — I2510 Atherosclerotic heart disease of native coronary artery without angina pectoris: Secondary | ICD-10-CM

## 2017-04-04 DIAGNOSIS — I10 Essential (primary) hypertension: Secondary | ICD-10-CM

## 2017-04-04 DIAGNOSIS — I2583 Coronary atherosclerosis due to lipid rich plaque: Principal | ICD-10-CM

## 2017-04-04 LAB — BASIC METABOLIC PANEL
BUN/Creatinine Ratio: 13 (ref 12–28)
BUN: 13 mg/dL (ref 8–27)
CO2: 32 mmol/L — ABNORMAL HIGH (ref 20–29)
Calcium: 9.6 mg/dL (ref 8.7–10.3)
Chloride: 92 mmol/L — ABNORMAL LOW (ref 96–106)
Creatinine, Ser: 1.04 mg/dL — ABNORMAL HIGH (ref 0.57–1.00)
GFR calc Af Amer: 62 mL/min/{1.73_m2} (ref >59)
GFR calc non Af Amer: 54 mL/min/{1.73_m2} — ABNORMAL LOW (ref >59)
Glucose: 110 mg/dL — ABNORMAL HIGH (ref 65–99)
Potassium: 3.4 mmol/L — ABNORMAL LOW (ref 3.5–5.2)
Sodium: 140 mmol/L (ref 134–144)

## 2017-04-04 MED ORDER — FUROSEMIDE 20 MG PO TABS: 20.0000 mg | ORAL_TABLET | ORAL | 3 refills | Status: DC

## 2017-04-04 MED ORDER — FUROSEMIDE 20 MG PO TABS: 20.0000 mg | ORAL_TABLET | Freq: Every day | ORAL | 3 refills | Status: DC

## 2017-04-04 NOTE — Telephone Encounter (Signed)
Notes recorded by Teressa Senter, RN on 04/04/2017 at 5:00 PM EST Patient made aware of results, pt instructed to take lasix every other day and patient is scheduled for BMET on 04/25/17. Patient in agreement with plan and thanked me for the call.

## 2017-04-04 NOTE — Telephone Encounter (Signed)
-----   Message from Burtis Junes, NP sent at 04/04/2017  4:37 PM EST ----- Ok to report. Labs are stable - potassium a little low as well as her chloride - lets' cut her Lasix to every other day - BMET in 3 weeks and otherwise, would continue on current regimen.

## 2017-04-25 ENCOUNTER — Other Ambulatory Visit: Payer: Medicare Other

## 2017-04-28 ENCOUNTER — Other Ambulatory Visit: Payer: Medicare Other | Admitting: *Deleted

## 2017-04-28 DIAGNOSIS — I2510 Atherosclerotic heart disease of native coronary artery without angina pectoris: Secondary | ICD-10-CM

## 2017-04-28 DIAGNOSIS — I2583 Coronary atherosclerosis due to lipid rich plaque: Principal | ICD-10-CM

## 2017-04-29 LAB — BASIC METABOLIC PANEL
BUN/Creatinine Ratio: 12 (ref 12–28)
BUN: 10 mg/dL (ref 8–27)
CO2: 27 mmol/L (ref 20–29)
Calcium: 8.9 mg/dL (ref 8.7–10.3)
Chloride: 98 mmol/L (ref 96–106)
Creatinine, Ser: 0.85 mg/dL (ref 0.57–1.00)
GFR calc Af Amer: 79 mL/min/{1.73_m2} (ref >59)
GFR calc non Af Amer: 69 mL/min/{1.73_m2} (ref >59)
Glucose: 75 mg/dL (ref 65–99)
Potassium: 3.8 mmol/L (ref 3.5–5.2)
Sodium: 143 mmol/L (ref 134–144)

## 2017-05-03 ENCOUNTER — Other Ambulatory Visit: Payer: Self-pay | Admitting: Neurosurgery

## 2017-05-03 DIAGNOSIS — M542 Cervicalgia: Secondary | ICD-10-CM

## 2017-05-07 ENCOUNTER — Ambulatory Visit
Admission: RE | Admit: 2017-05-07 | Discharge: 2017-05-07 | Disposition: A | Discharge status: Home / Self Care | Payer: Medicare Other | Source: Ambulatory Visit | Attending: Neurosurgery | Admitting: Neurosurgery

## 2017-05-07 DIAGNOSIS — M542 Cervicalgia: Secondary | ICD-10-CM

## 2017-05-24 ENCOUNTER — Inpatient Hospital Stay: Payer: Medicare Other | Attending: Hematology and Oncology

## 2017-05-24 DIAGNOSIS — K909 Intestinal malabsorption, unspecified: Secondary | ICD-10-CM | POA: Diagnosis not present

## 2017-05-24 DIAGNOSIS — D508 Other iron deficiency anemias: Secondary | ICD-10-CM | POA: Insufficient documentation

## 2017-05-24 DIAGNOSIS — Z853 Personal history of malignant neoplasm of breast: Secondary | ICD-10-CM | POA: Diagnosis not present

## 2017-05-24 LAB — CBC WITH DIFFERENTIAL/PLATELET
ABS GRANULOCYTE: 4.1 10*3/uL (ref 1.5–6.5)
BASOS PCT: 0 %
Basophils Absolute: 0 10*3/uL (ref 0.0–0.1)
EOS ABS: 0.3 10*3/uL (ref 0.0–0.5)
Eosinophils Relative: 5 %
HEMATOCRIT: 37.8 % (ref 34.8–46.6)
HEMOGLOBIN: 12 g/dL (ref 11.6–15.9)
Lymphocytes Relative: 18 %
Lymphs Abs: 1.1 10*3/uL (ref 0.9–3.3)
MCH: 28.5 pg (ref 25.1–34.0)
MCHC: 31.7 g/dL (ref 31.5–36.0)
MCV: 89.8 fL (ref 79.5–101.0)
Monocytes Absolute: 0.5 10*3/uL (ref 0.1–0.9)
Monocytes Relative: 8 %
NEUTROS ABS: 4.1 10*3/uL (ref 1.5–6.5)
Neutrophils Relative %: 69 %
Platelets: 182 10*3/uL (ref 145–400)
RBC: 4.21 MIL/uL (ref 3.70–5.45)
RDW: 15.4 % (ref 11.2–16.1)
WBC: 6 10*3/uL (ref 3.9–10.3)

## 2017-05-24 LAB — IRON AND TIBC
IRON: 58 ug/dL (ref 41–142)
Saturation Ratios: 17 % — ABNORMAL LOW (ref 21–57)
TIBC: 338 ug/dL (ref 236–444)
UIBC: 279 ug/dL

## 2017-05-24 LAB — FERRITIN: FERRITIN: 43 ng/mL (ref 9–269)

## 2017-05-24 NOTE — Assessment & Plan Note (Addendum)
  Patient felt a knot in the left breast. Examination reveals a small nodule that is freely mobile. Recommendation: Ultrasound evaluation and a mammogram were Negative

## 2017-05-24 NOTE — Assessment & Plan Note (Signed)
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 Patient received blood transfusion at [facility] given on 02/17/2017 x 2 doses  Return to clinic in 3 months

## 2017-05-25 ENCOUNTER — Telehealth: Payer: Self-pay | Admitting: Hematology and Oncology

## 2017-05-25 ENCOUNTER — Inpatient Hospital Stay (HOSPITAL_BASED_OUTPATIENT_CLINIC_OR_DEPARTMENT_OTHER): Payer: Medicare Other | Admitting: Hematology and Oncology

## 2017-05-25 DIAGNOSIS — Z170 Estrogen receptor positive status [ER+]: Secondary | ICD-10-CM

## 2017-05-25 DIAGNOSIS — C50411 Malignant neoplasm of upper-outer quadrant of right female breast: Secondary | ICD-10-CM

## 2017-05-25 DIAGNOSIS — K909 Intestinal malabsorption, unspecified: Secondary | ICD-10-CM

## 2017-05-25 DIAGNOSIS — Z853 Personal history of malignant neoplasm of breast: Secondary | ICD-10-CM | POA: Diagnosis not present

## 2017-05-25 DIAGNOSIS — D508 Other iron deficiency anemias: Secondary | ICD-10-CM | POA: Diagnosis not present

## 2017-05-25 NOTE — Progress Notes (Signed)
Patient Care Team: Gaynelle Arabian, MD as PCP - General (Family Medicine) Jerline Pain, MD as Consulting Physician (Cardiology) Wonda Horner, MD as Consulting Physician (Gastroenterology) Ralene Ok, MD as Consulting Physician (Surgery) Nicholas Lose, MD as Consulting Physician (Hematology and Oncology) Sylvan Cheese, NP as Nurse Practitioner (Hematology and Oncology) Jovita Kussmaul, MD as Consulting Physician (General Surgery) Thea Silversmith, MD (Inactive) as Consulting Physician (Radiation Oncology)  DIAGNOSIS:  Encounter Diagnoses  Name Primary?  . Malignant neoplasm of upper-outer quadrant of right breast in female, estrogen receptor positive ([place])   . Iron deficiency anemia secondary to inadequate dietary iron intake     SUMMARY OF ONCOLOGIC HISTORY:   Breast cancer of upper-outer quadrant of right female breast ([place])   07/30/2014 Mammogram    Right breast: Mass at 10 o'clock location 4 cm from the nipple measuring 1.7 x 1.3 x 1.3 cm. This corresponds to the palpable and mammographic finding. In the right axilla, there are several lymph nodes which are normal in size measuring up to 53m.      08/27/2014 Initial Biopsy    Right breast core needle bx: Invasive ductal carcinoma with LVI, grade 3, ER- (0%), PR- (0%), HER2/neu negative, Ki67 30%      08/27/2014 Clinical Stage    Stage IIA: T2 N0      09/22/2014 Definitive Surgery    Right lumpectomy/SLNB (Marlou Starks: Invasive ductal carcinoma, grade 3, 2.6 cm, high grade DCIS, LVI present, ER-, PR-, 2 LN removed and 1 positive for metastatic carcinoma (1/2)      09/22/2014 Pathologic Stage    Stage IIB: T2 N1 M0      10/15/2014 - 12/16/2014 Chemotherapy    Adjuvant chemotherapy with Taxotere Cytoxan 4 q3 weeks      10/21/2014 - 10/24/2014 Hospital Admission    Admission for neutropenic fever UTI Klebsiella      01/26/2015 - 03/11/2015 Radiation Therapy    Adjuvant RT (Pablo Ledger: Right breast / 45 Gray @ 1.8  GPearline Cablesper fraction x 25 fractions Right supraclavicular fossa/PAB 45 Gy '@1' .8 Gy per fraction x 25 fractions Right breast boost / 16 Gray at 2[employer] Corporationper fraction x 8 fractions      05/19/2015 Survivorship    Survivorship care plan visit completed.       CHIEF COMPLIANT: Follow-up of triple negative breast cancer, follow-up of iron deficiency anemia  INTERVAL HISTORY: Angela REGALis a 72year old with above-mentioned history of right breast cancer triple negative disease who underwent lumpectomy followed by adjuvant chemotherapy and radiation.  She is here for routine surveillance checkup.  She reports no new problems or concerns.  She denies any lumps or nodules in the breast.  She is also here to follow-up on iron deficiency anemia.  She received IV iron therapy in October 2018.  REVIEW OF SYSTEMS:   Constitutional: Denies fevers, chills or abnormal weight loss Eyes: Denies blurriness of vision Ears, nose, mouth, throat, and face: Denies mucositis or sore throat Respiratory: Denies cough, dyspnea or wheezes Cardiovascular: Denies palpitation, chest discomfort Gastrointestinal:  Denies nausea, heartburn or change in bowel habits Skin: Denies abnormal skin rashes Lymphatics: Denies new lymphadenopathy or easy bruising Neurological:Denies numbness, tingling or new weaknesses Behavioral/Psych: Mood is stable, no new changes  Extremities: No lower extremity edema Breast:  denies any pain or lumps or nodules in either breasts All other systems were reviewed with the patient and are negative.  I have reviewed the past medical history, past surgical history,  social history and family history with the patient and they are unchanged from previous note.  ALLERGIES:  is allergic to shellfish allergy; lactose intolerance (gi); brilinta [ticagrelor]; nitrofurantoin; penicillins; aleve [naproxen]; aspirin; ciprofloxacin; doxycycline; evista [raloxifene]; fosamax [alendronate sodium]; latex; naproxen  sodium; pravachol [pravastatin]; sulfa antibiotics; tape; welchol [colesevelam hcl]; and zocor [simvastatin].  MEDICATIONS:  Current Outpatient Medications  Medication Sig Dispense Refill  . aspirin EC 81 MG EC tablet Take 1 tablet (81 mg total) by mouth daily.    . calcium carbonate (OS-CAL) 600 MG TABS Take 600 mg by mouth 2 (two) times daily with a meal.    . carboxymethylcellulose (REFRESH PLUS) 0.5 % SOLN Place 1 drop into both eyes 4 (four) times daily.     . Cholecalciferol (VITAMIN D) 2000 UNITS tablet Take 2,000 Units by mouth 2 (two) times daily.     . clopidogrel (PLAVIX) 75 MG tablet TAKE 1 TABLET BY MOUTH  DAILY 90 tablet 2  . dexlansoprazole (DEXILANT) 60 MG capsule Take 60 mg by mouth daily.    Marland Kitchen escitalopram (LEXAPRO) 20 MG tablet Take 20 mg by mouth daily.    Marland Kitchen ezetimibe (ZETIA) 10 MG tablet Take 1 tablet (10 mg total) by mouth daily. 90 tablet 3  . fexofenadine (ALLEGRA) 180 MG tablet Take 180 mg by mouth daily.    . furosemide (LASIX) 20 MG tablet Take 1 tablet (20 mg total) by mouth every other day. 45 tablet 3  . gabapentin (NEURONTIN) 300 MG capsule TAKE 1 CAPSULE BY MOUTH 3  TIMES DAILY 270 capsule 1  . LORazepam (ATIVAN) 1 MG tablet Take 0.5-1 mg by mouth 2 (two) times daily as needed for anxiety. For sleeping per patient    . lovastatin (MEVACOR) 40 MG tablet Take 1 tablet (40 mg total) by mouth at bedtime. 90 tablet 3  . meclizine (ANTIVERT) 25 MG tablet Take 12.5-25 mg by mouth 3 (three) times daily as needed for dizziness.    . nitroGLYCERIN (NITROSTAT) 0.4 MG SL tablet Place 1 tablet (0.4 mg total) under the tongue every 5 (five) minutes as needed for chest pain (CP or SOB). 25 tablet 3  . ranitidine (ZANTAC) 150 MG tablet Take 150 mg by mouth at bedtime.    . sodium chloride (OCEAN) 0.65 % SOLN nasal spray Place 2 sprays into both nostrils as needed for congestion.     No current facility-administered medications for this visit.     PHYSICAL EXAMINATION: ECOG  PERFORMANCE STATUS: 1 - Symptomatic but completely ambulatory  Vitals:   05/25/17 1354  BP: 129/74  Pulse: 70  Resp: 16  Temp: 98.5 F (36.9 C)  SpO2: 98%   Filed Weights   05/25/17 1354  Weight: 183 lb 3.2 oz (83.1 kg)    GENERAL:alert, no distress and comfortable SKIN: skin color, texture, turgor are normal, no rashes or significant lesions EYES: normal, Conjunctiva are pink and non-injected, sclera clear OROPHARYNX:no exudate, no erythema and lips, buccal mucosa, and tongue normal  NECK: supple, thyroid normal size, non-tender, without nodularity LYMPH:  no palpable lymphadenopathy in the cervical, axillary or inguinal LUNGS: clear to auscultation and percussion with normal breathing effort HEART: regular rate & rhythm and no murmurs and no lower extremity edema ABDOMEN:abdomen soft, non-tender and normal bowel sounds MUSCULOSKELETAL:no cyanosis of digits and no clubbing  NEURO: alert & oriented x 3 with fluent speech, no focal motor/sensory deficits EXTREMITIES: No lower extremity edema BREAST: No palpable masses or nodules in either right or left breasts.  No palpable axillary supraclavicular or infraclavicular adenopathy no breast tenderness or nipple discharge. (exam performed in the presence of a chaperone)  LABORATORY DATA:  I have reviewed the data as listed CMP Latest Ref Rng & Units 04/28/2017 04/04/2017 03/21/2017  Glucose 65 - 99 mg/dL 75 110(H) 89  BUN 8 - 27 mg/dL '10 13 14  ' Creatinine 0.57 - 1.00 mg/dL 0.85 1.04(H) 0.91  Sodium 134 - 144 mmol/L 143 140 141  Potassium 3.5 - 5.2 mmol/L 3.8 3.4(L) 4.0  Chloride 96 - 106 mmol/L 98 92(L) 99  CO2 20 - 29 mmol/L 27 32(H) 30(H)  Calcium 8.7 - 10.3 mg/dL 8.9 9.6 9.0  Total Protein 6.0 - 8.5 g/dL - - 7.1  Total Bilirubin 0.0 - 1.2 mg/dL - - 0.4  Alkaline Phos 39 - 117 IU/L - - 70  AST 0 - 40 IU/L - - 18  ALT 0 - 32 IU/L - - 11    Lab Results  Component Value Date   WBC 6.0 05/24/2017   HGB 12.0 05/24/2017   HCT  37.8 05/24/2017   MCV 89.8 05/24/2017   PLT 182 05/24/2017   NEUTROABS 4.1 05/24/2017    ASSESSMENT & PLAN:  Breast cancer of upper-outer quadrant of right female breast  Patient felt a knot in the left breast. Examination reveals a small nodule that is freely mobile. Recommendation: Ultrasound evaluation and a mammogram were Negative Patient will have another ultrasound evaluation of the breast in February. She will see Dr. Marlou Starks next month.  Iron deficiency anemia ron deficiency anemia due to malabsorption: Iron studies done at [facility] show 3% iron saturation and ferritin of 10 with an MCV of 67 and a hemoglobin of 8.1. Patient received blood transfusion at [facility].  IV Iron given on 02/17/2017 x 2 doses Lab review: Hemoglobin 12, MCV 90 TIBC 338, ferritin 43, Lab work did not support the cause of her fatigue.  No evidence of iron deficiency anemia.  Patient does not need any IV iron infusion.    Return to clinic in 6 months with labs and follow-up  I spent 25 minutes talking to the patient of which more than half was spent in counseling and coordination of care.  Orders Placed This Encounter  Procedures  . Iron and TIBC    Standing Status:   Future    Standing Expiration Date:   05/25/2018  . Ferritin    Standing Status:   Future    Standing Expiration Date:   05/25/2018  . CBC (Cancer Center Only)    Standing Status:   Future    Standing Expiration Date:   05/25/2018   The patient has a good understanding of the overall plan. she agrees with it. she will call with any problems that may develop before the next visit here.   Harriette Ohara, MD 05/25/17

## 2017-05-25 NOTE — Telephone Encounter (Signed)
Gave patient AVS and calendar of upcoming July appointments.  °

## 2017-06-10 ENCOUNTER — Other Ambulatory Visit: Payer: Self-pay | Admitting: Cardiology

## 2017-06-19 ENCOUNTER — Other Ambulatory Visit: Payer: Medicare Other

## 2017-06-19 ENCOUNTER — Other Ambulatory Visit: Payer: Self-pay | Admitting: Hematology and Oncology

## 2017-06-19 DIAGNOSIS — C50411 Malignant neoplasm of upper-outer quadrant of right female breast: Secondary | ICD-10-CM

## 2017-06-19 DIAGNOSIS — N641 Fat necrosis of breast: Secondary | ICD-10-CM

## 2017-06-19 DIAGNOSIS — Z170 Estrogen receptor positive status [ER+]: Secondary | ICD-10-CM

## 2017-06-22 ENCOUNTER — Encounter: Payer: Self-pay | Admitting: Cardiology

## 2017-06-22 ENCOUNTER — Ambulatory Visit (INDEPENDENT_AMBULATORY_CARE_PROVIDER_SITE_OTHER): Payer: Medicare Other | Admitting: Cardiology

## 2017-06-22 VITALS — BP 128/84 | HR 70 | Ht 60.0 in | Wt 180.2 lb

## 2017-06-22 DIAGNOSIS — E7800 Pure hypercholesterolemia, unspecified: Secondary | ICD-10-CM | POA: Diagnosis not present

## 2017-06-22 DIAGNOSIS — I480 Paroxysmal atrial fibrillation: Secondary | ICD-10-CM

## 2017-06-22 DIAGNOSIS — I252 Old myocardial infarction: Secondary | ICD-10-CM

## 2017-06-22 DIAGNOSIS — I2510 Atherosclerotic heart disease of native coronary artery without angina pectoris: Secondary | ICD-10-CM | POA: Diagnosis not present

## 2017-06-22 DIAGNOSIS — I209 Angina pectoris, unspecified: Secondary | ICD-10-CM | POA: Diagnosis not present

## 2017-06-22 DIAGNOSIS — I2583 Coronary atherosclerosis due to lipid rich plaque: Secondary | ICD-10-CM | POA: Diagnosis not present

## 2017-06-22 DIAGNOSIS — I951 Orthostatic hypotension: Secondary | ICD-10-CM

## 2017-06-22 NOTE — Progress Notes (Signed)
Cardiology Office Note    Date:  06/22/2017   ID:  Angela Bond, DOB 08/08/1945, MRN [id number]  PCP:  Gaynelle Arabian, MD  Cardiologist:   Candee Furbish, MD     History of Present Illness:  Angela Bond is a 72 y.o. female with a past medical history of CAD s/p NSTEMI in June 2017 with DES to mid LAD, HTN, orthostatic hypotension, HLD, and anxiety who presented to [facility] with chest pain. There was residual disease in her proximal RCA (30%) and 35% in his ostial 2nd diagonal. She was placed on DAPT with ASA and Brilinta which was switched to Plavix outpatient for cost reasons. Of note, she was seen in the ED on 11/10/15 for chest pain 4 days after she was discharged from her NSTEMI. A GI cocktail relieved her pain in the ED and she was discharged from the ED. Her lisinopril was discontinued in July of this year as she had some orthostatic hypotension in the office. She reported compliance with medical therapy.   During one cardiac rehab session, she was hypotensive with BP of 88/60 post exercise. She was given a Gatorade and her BP rose to 94/58. She was dizzy and diaphoretic at the time. She called our office and her beta blocker (metoprolol 12.5 mg BID) was discontinued. She was at cardiac rehab again and developed left sided chest pain with radiation to her neck and shoulder with associated SOB, diaphoresis and nausea, somewhat reminiscent of prior angina. EKG during pain showed no acute ST/T changes.  Initial troponin was negative.Vitals were stable with BP 142/98 (mildly hypertensive). CXR NAD. Dr. Burt Knack felt her symptoms were difficult to sort out. Her PCI was just performed about 2 months prior to this and he felt it was unlikely that she has restenosis or new coronary plaque rupture as she has been compliant with medical therapy. Labs otherwise notable for Hgb 10.7 (c/w prior). Per notes from our team,she had troponin rechecked down in nuc med and it was negative. She underwent inpatient  stress test. This showed septal scar/infarction, no findings for ischemia, EF 68%. It was recommended to try her back on a nightly dose of Toprol 12.5mg  daily but she was unable to take.  She did not wish to take atorvastatin, Crestor was too expensive. She is now on lovastatin in doing well with this. She was reassured by the nuclear stress test that was low risk with no ischemia. She is now able to walk without any difficulty. She is less short of breath. Overall she is happy. She changed her depression medicine and she is feeling much better.  09/22/16-she comes in today for follow-up. Notes from Truitt Merle and pharmacy team reviewed. Crestor too weak, legs hurt. No CP, no SOB (mild up hill to house) She is also full of small skin lesions.  06/22/17 - getting over cold, mild wheeze. No fever, chills, no syncope, no bleeding. Skin rash has improved. No CP.    Past Medical History:  Diagnosis Date  . Anemia    hx  . Anxiety and depression   . Arthritis    "~ all over my body; in the joints"  . Breast cancer, right ([place]) 3/16  . Coronary artery disease    a.  s/p NSTEMI in June 2017 with DES to mid LAD.  Marland Kitchen Depression   . GERD (gastroesophageal reflux disease)   . H/O hiatal hernia   . Heart murmur   . History of blood transfusion    "  when taking chemo & w/one of my knee replacements" (11/05/2015)  . Hypercholesteremia   . Hypertension   . Orthostatic hypotension   . Personal history of chemotherapy   . Personal history of radiation therapy   . Pneumonia X 1  . Shingles 5/15   hx  . Shortness of breath    "stomach is in lungs" when had hernia 2014 none now(07/28/14)  . Sinus headache     Past Surgical History:  Procedure Laterality Date  . BACK SURGERY    . BREAST BIOPSY Right   . BREAST LUMPECTOMY Right 08/2014  . BREAST LUMPECTOMY WITH SENTINEL LYMPH NODE BIOPSY Right 09/22/2014   Procedure: RIGHT BREAST LUMPECTOMY WITH NEEDLE LOCALIZATION AND RIGHT AXILLARY SENTINEL LYMPH  NODE BIOPSY;  Surgeon: [name], MD;  Location: [facility];  Service: General;  Laterality: Right;  . CARDIAC CATHETERIZATION    . CARDIAC CATHETERIZATION N/A 11/05/2015   Procedure: Left Heart Cath and Coronary Angiography;  Surgeon: Belva Crome, MD;  Location: [facility] [facility];  Service: Cardiovascular;  Laterality: N/A;  . CARDIAC CATHETERIZATION N/A 11/05/2015   Procedure: Coronary Stent Intervention;  Surgeon: Belva Crome, MD;  Location: [place] [facility];  Service: Cardiovascular;  Laterality: N/A;  . CARPAL TUNNEL RELEASE Right 09/2015  . CORONARY ANGIOPLASTY WITH STENT PLACEMENT  11/05/2015   "1 stent"  . ESOPHAGEAL MANOMETRY N/A 07/02/2012   Procedure: ESOPHAGEAL MANOMETRY (EM);  Surgeon: Lear Ng, MD;  Location: [facility];  Service: Endoscopy;  Laterality: N/A;  schooler to read  . EYE SURGERY Left 2013   tube placed tear duct  . HERNIA REPAIR    . HIATAL HERNIA REPAIR N/A 08/10/2012   Procedure: LAPAROSCOPIC REPAIR OF HIATAL HERNIA WITH MESH AND EGD WITH PEG TUBE PLACEMENT;  Surgeon: Ralene Ok, MD;  Location: [facility];  Service: General;  Laterality: N/A;  . JOINT REPLACEMENT    . PORT-A-CATH REMOVAL  06/2015  . PORTACATH PLACEMENT Left 09/22/2014   Procedure: INSERTION PORT-A-CATH;  Surgeon: [name], MD;  Location: [place];  Service: General;  Laterality: Left;  . POSTERIOR LUMBAR FUSION  2016   "had 2 vertebrae fused"  . TONSILLECTOMY  as child  . TOTAL KNEE ARTHROPLASTY Bilateral 2011,2012    Current Medications: Outpatient Medications Prior to Visit  Medication Sig Dispense Refill  . aspirin EC 81 MG EC tablet Take 1 tablet (81 mg total) by mouth daily.    . calcium carbonate (OS-CAL) 600 MG TABS Take 600 mg by mouth 2 (two) times daily with a meal.    . carboxymethylcellulose (REFRESH PLUS) 0.5 % SOLN Place 1 drop into both eyes 4 (four) times daily.     . Cholecalciferol (VITAMIN D) 2000 UNITS tablet Take 2,000 Units by mouth 2 (two) times daily.      . clopidogrel (PLAVIX) 75 MG tablet TAKE 1 TABLET BY MOUTH  DAILY 90 tablet 2  . dexlansoprazole (DEXILANT) 60 MG capsule Take 60 mg by mouth daily.    Marland Kitchen escitalopram (LEXAPRO) 20 MG tablet Take 20 mg by mouth daily.    Marland Kitchen ezetimibe (ZETIA) 10 MG tablet TAKE 1 TABLET BY MOUTH  DAILY 90 tablet 3  . fexofenadine (ALLEGRA) 180 MG tablet Take 180 mg by mouth daily.    . furosemide (LASIX) 20 MG tablet Take 1 tablet (20 mg total) by mouth every other day. 45 tablet 3  . gabapentin (NEURONTIN) 300 MG capsule TAKE 1 CAPSULE BY MOUTH 3  TIMES  DAILY 270 capsule 1  . LORazepam (ATIVAN) 1 MG tablet Take 0.5-1 mg by mouth 2 (two) times daily as needed for anxiety. For sleeping per patient    . lovastatin (MEVACOR) 40 MG tablet TAKE 1 TABLET BY MOUTH AT  BEDTIME 90 tablet 3  . meclizine (ANTIVERT) 25 MG tablet Take 12.5-25 mg by mouth 3 (three) times daily as needed for dizziness.    . nitroGLYCERIN (NITROSTAT) 0.4 MG SL tablet Place 1 tablet (0.4 mg total) under the tongue every 5 (five) minutes as needed for chest pain (CP or SOB). 25 tablet 3  . ranitidine (ZANTAC) 150 MG tablet Take 150 mg by mouth at bedtime.    . sodium chloride (OCEAN) 0.65 % SOLN nasal spray Place 2 sprays into both nostrils as needed for congestion.     No facility-administered medications prior to visit.      Allergies:   Shellfish allergy; Lactose intolerance (gi); Brilinta [ticagrelor]; Nitrofurantoin; Penicillins; Aleve [naproxen]; Aspirin; Ciprofloxacin; Doxycycline; Evista [raloxifene]; Fosamax [alendronate sodium]; Latex; Naproxen sodium; Pravachol [pravastatin]; Sulfa antibiotics; Tape; Welchol [colesevelam hcl]; and Zocor [simvastatin]   Social History   Socioeconomic History  . Marital status: Married    Spouse name: None  . Number of children: 1  . Years of education: None  . Highest education level: None  Social Needs  . Financial resource strain: None  . Food insecurity - worry: None  . Food insecurity -  inability: None  . Transportation needs - medical: None  . Transportation needs - non-medical: None  Occupational History  . None  Tobacco Use  . Smoking status: Never Smoker  . Smokeless tobacco: Never Used  Substance and Sexual Activity  . Alcohol use: No  . Drug use: No  . Sexual activity: No  Other Topics Concern  . None  Social History Narrative   Lives with husband.  Does not use assist device.       Family History:  The patient's family history includes Cancer in her father and sister; Diabetes in her father and sister; Heart disease in her father, mother, and sister.   ROS:   Please see the history of present illness.    Review of Systems  All other systems reviewed and are negative.   PHYSICAL EXAM:   VS:  BP 128/84   Pulse 70   Ht 5' (1.524 m)   Wt 180 lb 3.2 oz (81.7 kg)   SpO2 99%   BMI 35.19 kg/m    GEN: Well nourished, well developed, in no acute distress HEENT: normal  Neck: no JVD, carotid bruits, or masses Cardiac: RRR; no murmurs, rubs, or gallops,no edema  Respiratory:  clear to auscultation bilaterally, normal work of breathing GI: soft, nontender, nondistended, + BS MS: no deformity or atrophy  Skin: warm and dry, no rash, skin excoriations are improved Neuro:  Alert and Oriented x 3, Strength and sensation are intact Psych: euthymic mood, full affect   Wt Readings from Last 3 Encounters:  06/22/17 180 lb 3.2 oz (81.7 kg)  05/25/17 183 lb 3.2 oz (83.1 kg)  03/21/17 182 lb 12.8 oz (82.9 kg)      Studies/Labs Reviewed:   EKG:  None today  Recent Labs: 03/21/2017: ALT 11; NT-Pro BNP 177 04/28/2017: BUN 10; Creatinine, Ser 0.85; Potassium 3.8; Sodium 143 05/24/2017: Hemoglobin 12.0; Platelets 182   Lipid Panel    Component Value Date/Time   CHOL 152 03/21/2017 0935   TRIG 84 03/21/2017 0935   HDL 66  03/21/2017 0935   CHOLHDL 2.3 03/21/2017 0935   CHOLHDL 3.4 11/06/2015 0519   VLDL 23 11/06/2015 0519   LDLCALC 69 03/21/2017 0935     Additional studies/ records that were reviewed today include:   Cath 11/05/15: 1. Prox RCA to Mid RCA lesion, 30% stenosed. 2. Mid LAD lesion, 99% stenosed. Post intervention, there is a 5% residual stenosis. 3. Ost 2nd Diag lesion, 35% stenosed.    Acute coronary syndrome caused by acute/subacute thrombotic obstruction of the proximal LAD forming a [place] 111 bifurcation lesion with the large diagonal #1.  Luminal irregularities are noted in the circumflex and right coronary.  Severe anteroapical hypokinesis with estimated ejection fraction 35-40%. LV end-diastolic pressure is normal.  Successful PTCA and stent of the LAD thrombus containing lesion with reduction in the 95% stenosis to 0% with TIMI grade 3 flow using a drug-eluting stent postdilated to 3.5 mm in diameter. The diagonal branch though "jailed" ended with less than 40% ostial obstruction.   NUC stress 01/16/16: 1. Septal scar/infarction.  No findings for ischemia.  2. Septal wall motion abnormality and bulging of the inferoseptal wall with contraction.  3. Left ventricular ejection fraction 68%  4. Non invasive risk stratification*: Intermediate to low risk.    ASSESSMENT:    1. CAD in native artery   2. Hypercholesteremia   3. Paroxysmal atrial fibrillation (HCC)   4. Angina pectoris ([place])   5. History of MI (myocardial infarction)   6. Coronary artery disease due to lipid rich plaque   7. Orthostatic hypotension      PLAN:  In order of problems listed above:  Coronary artery disease  - DES to mid LAD 2017 with mild residual disease RCA and ostial second diagonal. Dual antiplatelet therapy. Plavix.  - Since she had pain during cardiac rehabilitation, she was admitted and had inpatient stress test which was stable without ischemia, EF 68%. They tried to rechallenge her Toprol but this was stopped again.  - Overall she's not having any chest discomfort stable. Doing well.   Angina -Well-controlled  with current regimen.  Brief episode post PCI, reassurance with nuclear stress test.  Negative troponin.  History of MI  - June 2017-non-ST elevation myocardial infarction. DES to mid LAD.  Orthostatic hypotension  - Currently very stable. We have to be careful with her blood pressure medications. She has been hypotensive at times post exercise. Previously lisinopril was discontinued because of orthostasis. Beta blocker had been stopped as well because of orthostasis.  - Willing to tolerate higher blood pressure. No changes. Doing well.   Hyperlipidemia  -She states that she did not tolerate the Crestor. Stopped it because of leg weakness and cramping. Back on lovastatin 40 and add Zetia.LDL 69 on 03/2017. Asked her to try over the counter magnesium.   Brief postoperative atrial fibrillation in 2014 during PEG tube placement, EGD -No further episodes.  No anticoagulation warranted at that time.  Brief.  We will see her back in 6 months, APP.     Medication Adjustments/Labs and Tests Ordered: Current medicines are reviewed at length with the patient today.  Concerns regarding medicines are outlined above.  Medication changes, Labs and Tests ordered today are listed in the Patient Instructions below. Patient Instructions  Medication Instructions:  The current medical regimen is effective;  continue present plan and medications.  Follow-Up: Follow up in 6 months with Truitt Merle, NP.  You will receive a letter in the mail 2 months before you are due.  Please call us when you receive this letter to schedule your follow up appointment.  Follow up in 1 year with Dr. Marlou Porch.  You will receive a letter in the mail 2 months before you are due.  Please call us when you receive this letter to schedule your follow up appointment.  If you need a refill on your cardiac medications before your next appointment, please call your pharmacy.  Thank you for choosing [facility]!!         Signed, Candee Furbish, MD  06/22/2017 10:54 AM    [facility] [address], [place], [place]  [address] Phone: [phone]; Fax: [phone]

## 2017-06-22 NOTE — Patient Instructions (Signed)
Medication Instructions:  The current medical regimen is effective;  continue present plan and medications.  Follow-Up: Follow up in 6 months with Lori Gerhardt, NP.  You will receive a letter in the mail 2 months before you are due.  Please call us when you receive this letter to schedule your follow up appointment.  Follow up in 1 year with Dr. Skains.  You will receive a letter in the mail 2 months before you are due.  Please call us when you receive this letter to schedule your follow up appointment.  If you need a refill on your cardiac medications before your next appointment, please call your pharmacy.  Thank you for choosing  [facility]!!     

## 2017-07-31 ENCOUNTER — Emergency Department (HOSPITAL_COMMUNITY): Payer: Medicare Other

## 2017-07-31 ENCOUNTER — Ambulatory Visit (HOSPITAL_COMMUNITY)
Admission: EM | Admit: 2017-07-31 | Discharge: 2017-08-01 | Disposition: A | Discharge status: Home / Self Care | Payer: Medicare Other | Attending: Cardiology | Admitting: Cardiology

## 2017-07-31 ENCOUNTER — Other Ambulatory Visit: Payer: Self-pay

## 2017-07-31 ENCOUNTER — Encounter (HOSPITAL_COMMUNITY): Payer: Self-pay

## 2017-07-31 DIAGNOSIS — Z79899 Other long term (current) drug therapy: Secondary | ICD-10-CM | POA: Diagnosis not present

## 2017-07-31 DIAGNOSIS — F329 Major depressive disorder, single episode, unspecified: Secondary | ICD-10-CM | POA: Diagnosis not present

## 2017-07-31 DIAGNOSIS — Z91013 Allergy to seafood: Secondary | ICD-10-CM | POA: Insufficient documentation

## 2017-07-31 DIAGNOSIS — Z881 Allergy status to other antibiotic agents status: Secondary | ICD-10-CM | POA: Diagnosis not present

## 2017-07-31 DIAGNOSIS — Z882 Allergy status to sulfonamides status: Secondary | ICD-10-CM | POA: Diagnosis not present

## 2017-07-31 DIAGNOSIS — I951 Orthostatic hypotension: Secondary | ICD-10-CM

## 2017-07-31 DIAGNOSIS — Z96653 Presence of artificial knee joint, bilateral: Secondary | ICD-10-CM | POA: Insufficient documentation

## 2017-07-31 DIAGNOSIS — R079 Chest pain, unspecified: Secondary | ICD-10-CM | POA: Diagnosis present

## 2017-07-31 DIAGNOSIS — Z886 Allergy status to analgesic agent status: Secondary | ICD-10-CM | POA: Diagnosis not present

## 2017-07-31 DIAGNOSIS — Z888 Allergy status to other drugs, medicaments and biological substances status: Secondary | ICD-10-CM | POA: Insufficient documentation

## 2017-07-31 DIAGNOSIS — Z6833 Body mass index (BMI) 33.0-33.9, adult: Secondary | ICD-10-CM | POA: Insufficient documentation

## 2017-07-31 DIAGNOSIS — Z7902 Long term (current) use of antithrombotics/antiplatelets: Secondary | ICD-10-CM | POA: Insufficient documentation

## 2017-07-31 DIAGNOSIS — I2510 Atherosclerotic heart disease of native coronary artery without angina pectoris: Secondary | ICD-10-CM | POA: Diagnosis present

## 2017-07-31 DIAGNOSIS — K219 Gastro-esophageal reflux disease without esophagitis: Secondary | ICD-10-CM | POA: Diagnosis not present

## 2017-07-31 DIAGNOSIS — Z853 Personal history of malignant neoplasm of breast: Secondary | ICD-10-CM | POA: Insufficient documentation

## 2017-07-31 DIAGNOSIS — I252 Old myocardial infarction: Secondary | ICD-10-CM | POA: Insufficient documentation

## 2017-07-31 DIAGNOSIS — I700 Atherosclerosis of aorta: Secondary | ICD-10-CM | POA: Insufficient documentation

## 2017-07-31 DIAGNOSIS — E7800 Pure hypercholesterolemia, unspecified: Secondary | ICD-10-CM | POA: Diagnosis present

## 2017-07-31 DIAGNOSIS — Z955 Presence of coronary angioplasty implant and graft: Secondary | ICD-10-CM | POA: Insufficient documentation

## 2017-07-31 DIAGNOSIS — I10 Essential (primary) hypertension: Secondary | ICD-10-CM | POA: Diagnosis present

## 2017-07-31 DIAGNOSIS — Z880 Allergy status to penicillin: Secondary | ICD-10-CM | POA: Diagnosis not present

## 2017-07-31 DIAGNOSIS — F419 Anxiety disorder, unspecified: Secondary | ICD-10-CM | POA: Insufficient documentation

## 2017-07-31 DIAGNOSIS — I200 Unstable angina: Secondary | ICD-10-CM

## 2017-07-31 DIAGNOSIS — Z7982 Long term (current) use of aspirin: Secondary | ICD-10-CM | POA: Diagnosis not present

## 2017-07-31 DIAGNOSIS — I255 Ischemic cardiomyopathy: Secondary | ICD-10-CM | POA: Diagnosis not present

## 2017-07-31 DIAGNOSIS — I119 Hypertensive heart disease without heart failure: Secondary | ICD-10-CM | POA: Insufficient documentation

## 2017-07-31 DIAGNOSIS — I25110 Atherosclerotic heart disease of native coronary artery with unstable angina pectoris: Secondary | ICD-10-CM | POA: Insufficient documentation

## 2017-07-31 DIAGNOSIS — E669 Obesity, unspecified: Secondary | ICD-10-CM | POA: Diagnosis not present

## 2017-07-31 HISTORY — DX: Atherosclerosis of aorta: I70.0

## 2017-07-31 LAB — BRAIN NATRIURETIC PEPTIDE: B Natriuretic Peptide: 47.8 pg/mL (ref 0.0–100.0)

## 2017-07-31 LAB — CBC WITH DIFFERENTIAL/PLATELET
Basophils Absolute: 0 10*3/uL (ref 0.0–0.1)
Basophils Relative: 0 %
Eosinophils Absolute: 0.4 10*3/uL (ref 0.0–0.7)
Eosinophils Relative: 4 %
HCT: 36.5 % (ref 36.0–46.0)
Hemoglobin: 11.5 g/dL — ABNORMAL LOW (ref 12.0–15.0)
Lymphocytes Relative: 17 %
Lymphs Abs: 1.6 10*3/uL (ref 0.7–4.0)
MCH: 29.3 pg (ref 26.0–34.0)
MCHC: 31.5 g/dL (ref 30.0–36.0)
MCV: 93.1 fL (ref 78.0–100.0)
Monocytes Absolute: 0.6 10*3/uL (ref 0.1–1.0)
Monocytes Relative: 6 %
Neutro Abs: 6.9 10*3/uL (ref 1.7–7.7)
Neutrophils Relative %: 73 %
Platelets: 203 10*3/uL (ref 150–400)
RBC: 3.92 MIL/uL (ref 3.87–5.11)
RDW: 15 % (ref 11.5–15.5)
WBC: 9.4 10*3/uL (ref 4.0–10.5)

## 2017-07-31 LAB — BASIC METABOLIC PANEL
Anion gap: 15 (ref 5–15)
BUN: 6 mg/dL (ref 6–20)
CO2: 25 mmol/L (ref 22–32)
Calcium: 8.8 mg/dL — ABNORMAL LOW (ref 8.9–10.3)
Chloride: 100 mmol/L — ABNORMAL LOW (ref 101–111)
Creatinine, Ser: 0.85 mg/dL (ref 0.44–1.00)
GFR calc Af Amer: >60 mL/min (ref >60)
GFR calc non Af Amer: >60 mL/min (ref >60)
Glucose, Bld: 90 mg/dL (ref 65–99)
Potassium: 3.6 mmol/L (ref 3.5–5.1)
Sodium: 140 mmol/L (ref 135–145)

## 2017-07-31 LAB — HEPARIN LEVEL (UNFRACTIONATED): Heparin Unfractionated: 0.49 IU/mL (ref 0.30–0.70)

## 2017-07-31 LAB — TROPONIN I
Troponin I: <0.03 ng/mL (ref <0.03)
Troponin I: <0.03 ng/mL (ref <0.03)

## 2017-07-31 MED ORDER — METOPROLOL TARTRATE 12.5 MG HALF TABLET
12.5000 mg | ORAL_TABLET | Freq: Two times a day (BID) | ORAL | Status: DC
  Administered 2017-07-31 – 2017-08-01 (×2): 12.5 mg via ORAL
  Filled 2017-07-31 (×2): qty 1

## 2017-07-31 MED ORDER — EZETIMIBE 10 MG PO TABS
10.0000 mg | ORAL_TABLET | Freq: Every day | ORAL | Status: DC
  Administered 2017-08-01: 10 mg via ORAL
  Filled 2017-07-31: qty 1

## 2017-07-31 MED ORDER — SODIUM CHLORIDE 0.9% FLUSH: 3.0000 mL | INTRAVENOUS | Status: DC | PRN

## 2017-07-31 MED ORDER — DIPHENHYDRAMINE HCL 50 MG/ML IJ SOLN
50.0000 mg | Freq: Once | INTRAMUSCULAR | Status: AC
  Administered 2017-08-01: 50 mg via INTRAVENOUS
  Filled 2017-07-31 (×2): qty 1

## 2017-07-31 MED ORDER — ATORVASTATIN CALCIUM 40 MG PO TABS
40.0000 mg | ORAL_TABLET | Freq: Every day | ORAL | Status: DC
  Administered 2017-08-01: 40 mg via ORAL
  Filled 2017-07-31: qty 1

## 2017-07-31 MED ORDER — SODIUM CHLORIDE 0.9% FLUSH
3.0000 mL | Freq: Two times a day (BID) | INTRAVENOUS | Status: DC
  Administered 2017-07-31 – 2017-08-01 (×2): 3 mL via INTRAVENOUS

## 2017-07-31 MED ORDER — ASPIRIN 81 MG PO CHEW
81.0000 mg | CHEWABLE_TABLET | ORAL | Status: AC
  Administered 2017-08-01: 81 mg via ORAL
  Filled 2017-07-31: qty 1

## 2017-07-31 MED ORDER — HEPARIN BOLUS VIA INFUSION
3500.0000 [IU] | Freq: Once | INTRAVENOUS | Status: AC
  Administered 2017-07-31: 3500 [IU] via INTRAVENOUS
  Filled 2017-07-31: qty 3500

## 2017-07-31 MED ORDER — PANTOPRAZOLE SODIUM 40 MG PO TBEC
40.0000 mg | DELAYED_RELEASE_TABLET | Freq: Every day | ORAL | Status: DC
  Administered 2017-07-31 – 2017-08-01 (×2): 40 mg via ORAL
  Filled 2017-07-31 (×2): qty 1

## 2017-07-31 MED ORDER — SODIUM CHLORIDE 0.9 % IV SOLN: 250.0000 mL | INTRAVENOUS | Status: DC | PRN

## 2017-07-31 MED ORDER — NITROGLYCERIN IN D5W 200-5 MCG/ML-% IV SOLN
0.0000 ug/min | Freq: Once | INTRAVENOUS | Status: DC
  Filled 2017-07-31: qty 250

## 2017-07-31 MED ORDER — ACETAMINOPHEN 325 MG PO TABS: 325.0000 mg | ORAL_TABLET | Freq: Four times a day (QID) | ORAL | Status: DC | PRN

## 2017-07-31 MED ORDER — PREDNISONE 20 MG PO TABS
50.0000 mg | ORAL_TABLET | Freq: Four times a day (QID) | ORAL | Status: AC
  Administered 2017-07-31 – 2017-08-01 (×3): 50 mg via ORAL
  Filled 2017-07-31: qty 1
  Filled 2017-07-31 (×2): qty 2

## 2017-07-31 MED ORDER — CLOPIDOGREL BISULFATE 75 MG PO TABS
75.0000 mg | ORAL_TABLET | Freq: Every day | ORAL | Status: DC
  Administered 2017-08-01: 75 mg via ORAL
  Filled 2017-07-31: qty 1

## 2017-07-31 MED ORDER — ESCITALOPRAM OXALATE 10 MG PO TABS
20.0000 mg | ORAL_TABLET | Freq: Every day | ORAL | Status: DC
  Administered 2017-08-01: 20 mg via ORAL
  Filled 2017-07-31: qty 2

## 2017-07-31 MED ORDER — NITROGLYCERIN 0.4 MG SL SUBL: 0.4000 mg | SUBLINGUAL_TABLET | SUBLINGUAL | Status: DC | PRN

## 2017-07-31 MED ORDER — DIPHENHYDRAMINE HCL 25 MG PO CAPS: 50.0000 mg | ORAL_CAPSULE | Freq: Once | ORAL | Status: AC

## 2017-07-31 MED ORDER — HEPARIN (PORCINE) IN NACL 100-0.45 UNIT/ML-% IJ SOLN
900.0000 [IU]/h | INTRAMUSCULAR | Status: DC
  Administered 2017-07-31: 750 [IU]/h via INTRAVENOUS
  Filled 2017-07-31: qty 250

## 2017-07-31 MED ORDER — SODIUM CHLORIDE 0.9 % WEIGHT BASED INFUSION
1.0000 mL/kg/h | INTRAVENOUS | Status: DC
  Administered 2017-08-01: 1 mL/kg/h via INTRAVENOUS

## 2017-07-31 MED ORDER — LORAZEPAM 0.5 MG PO TABS
0.5000 mg | ORAL_TABLET | Freq: Two times a day (BID) | ORAL | Status: DC | PRN
  Administered 2017-07-31: 1 mg via ORAL
  Filled 2017-07-31: qty 2

## 2017-07-31 MED ORDER — ASPIRIN EC 81 MG PO TBEC: 81.0000 mg | DELAYED_RELEASE_TABLET | Freq: Every day | ORAL | Status: DC

## 2017-07-31 MED ORDER — SODIUM CHLORIDE 0.9 % WEIGHT BASED INFUSION
3.0000 mL/kg/h | INTRAVENOUS | Status: DC
  Administered 2017-08-01: 3 mL/kg/h via INTRAVENOUS

## 2017-07-31 NOTE — ED Triage Notes (Signed)
Pt arrives to ED from home with complaints of centralized, non-radiating chest tightness  since 1400 today but also had episodes of chest tightness the past two days as well. EMS reports pt has hx of stents placed, EKG was unremarkable. 2Sl nitro, 324 asa, and 4 of zofran en route. Cp decreased from 9/10 to 2/10. Pt placed in position of comfort with bed locked and lowered, call bell in reach.

## 2017-07-31 NOTE — ED Notes (Addendum)
Pt ambulated to RR and back to room. Pt stated that she felt fine. Pt did not complain of any pain.

## 2017-07-31 NOTE — Progress Notes (Signed)
ANTICOAGULATION CONSULT NOTE - Initial Consult  Pharmacy Consult for heparin Indication: chest pain/ACS  Allergies  Allergen Reactions  . Shellfish Allergy Anaphylaxis    ANAPHYLAXIS TO OYSTERS  . Lactose Intolerance (Gi) Nausea And Vomiting  . Brilinta [Ticagrelor] Other (See Comments)    Indigestion  . Penicillins Nausea And Vomiting    Has patient had a PCN reaction causing immediate rash, facial/tongue/throat swelling, SOB or lightheadedness with hypotension: Yes Has patient had a PCN reaction causing severe rash involving mucus membranes or skin necrosis: No Has patient had a PCN reaction that required hospitalization: No Has patient had a PCN reaction occurring within the last 10 years: No If all of the above answers are "NO", then may proceed with Cephalosporin use.   [name] [Naproxen] Nausea And Vomiting  . Aspirin Other (See Comments)    Burns stomach  . Ciprofloxacin Nausea And Vomiting  . Doxycycline Nausea And Vomiting  . Evista [Raloxifene] Other (See Comments)    LEG ACHES  . Fosamax [Alendronate Sodium] Other (See Comments)    Leg aches  . Latex Rash  . Naproxen Sodium Nausea And Vomiting  . Nitrofurantoin Rash    "I broke out down there"  . Pravachol [Pravastatin] Other (See Comments)    GI upset   . Sulfa Antibiotics Nausea And Vomiting  . Tape Rash  . Welchol [Colesevelam Hcl] Other (See Comments)    Stomach cramps  . Zocor [Simvastatin] Rash    Patient Measurements: Height: 5' (152.4 cm) Weight: 170 lb (77.1 kg) IBW/kg (Calculated) : 45.5 Heparin Dosing Weight: 63 Kg  Vital Signs:    Labs: No results for input(s): HGB, HCT, PLT, APTT, LABPROT, INR, HEPARINUNFRC, HEPRLOWMOCWT, CREATININE, CKTOTAL, CKMB, TROPONINI in the last 72 hours.  CrCl cannot be calculated (Patient's most recent lab result is older than the maximum 21 days allowed.).   Medical History: Past Medical History:  Diagnosis Date  . Anemia    hx  . Anxiety and depression    . Arthritis    "~ all over my body; in the joints"  . Breast cancer, right ([place]) 3/16  . Coronary artery disease    a.  s/p NSTEMI in June 2017 with DES to mid LAD.  Marland Kitchen Depression   . GERD (gastroesophageal reflux disease)   . H/O hiatal hernia   . Heart murmur   . History of blood transfusion    "when taking chemo & w/one of my knee replacements" (11/05/2015)  . Hypercholesteremia   . Hypertension   . Orthostatic hypotension   . Personal history of chemotherapy   . Personal history of radiation therapy   . Pneumonia X 1  . Shingles 5/15   hx  . Shortness of breath    "stomach is in lungs" when had hernia 2014 none now(07/28/14)  . Sinus headache     Assessment: 3 yoF presenting with chest pain. Patient on plavix and aspirin PTA, no anticoagulation documented. CBC WNL. Pharmacy to dose heparin.  Goal of Therapy:  Heparin level 0.3-0.7 units/ml Monitor platelets by anticoagulation protocol: Yes   Plan:  Give 3500 units bolus x 1 Start heparin infusion at 750 units/hr Check anti-Xa level in 6 hours and daily while on heparin Continue to monitor H&H and platelets  Nicole Kindred L Modena [place] 07/31/2017,3:41 PM

## 2017-07-31 NOTE — ED Notes (Signed)
Pt is very difficult stick, blood draw attempted by staff x7. Unable to collect d-dimer at this time

## 2017-07-31 NOTE — ED Notes (Signed)
Pt stating she wants to wait for blood work before starting any medicine

## 2017-07-31 NOTE — ED Notes (Signed)
Unable to get blood ?

## 2017-07-31 NOTE — ED Notes (Signed)
Writer attempted to collect blood work [employer]

## 2017-07-31 NOTE — Progress Notes (Signed)
ANTICOAGULATION CONSULT NOTE - Follow Up Consult  Pharmacy Consult for heparin Indication: chest pain/ACS  Allergies  Allergen Reactions  . Shellfish Allergy Anaphylaxis    ANAPHYLAXIS TO OYSTERS  . Lactose Intolerance (Gi) Nausea And Vomiting  . Brilinta [Ticagrelor] Other (See Comments)    Indigestion  . Penicillins Nausea And Vomiting    Has patient had a PCN reaction causing immediate rash, facial/tongue/throat swelling, SOB or lightheadedness with hypotension: Yes Has patient had a PCN reaction causing severe rash involving mucus membranes or skin necrosis: No Has patient had a PCN reaction that required hospitalization: No Has patient had a PCN reaction occurring within the last 10 years: No If all of the above answers are "NO", then may proceed with Cephalosporin use.   [name] [Naproxen] Nausea And Vomiting  . Aspirin Other (See Comments)    Burns stomach  . Ciprofloxacin Nausea And Vomiting  . Doxycycline Nausea And Vomiting  . Evista [Raloxifene] Other (See Comments)    LEG ACHES  . Fosamax [Alendronate Sodium] Other (See Comments)    Leg aches  . Latex Rash  . Naproxen Sodium Nausea And Vomiting  . Nitrofurantoin Rash    "I broke out down there"  . Pravachol [Pravastatin] Other (See Comments)    GI upset   . Sulfa Antibiotics Nausea And Vomiting  . Tape Rash  . Welchol [Colesevelam Hcl] Other (See Comments)    Stomach cramps  . Zocor [Simvastatin] Rash    Patient Measurements: Height: 5' (152.4 cm) Weight: 170 lb (77.1 kg) IBW/kg (Calculated) : 45.5 Heparin Dosing Weight: 63 Kg  Vital Signs: BP: 121/67 (03/18 2030) Pulse Rate: 71 (03/18 2030)  Labs: Recent Labs    07/31/17 1651 07/31/17 2044  HGB 11.5*  --   HCT 36.5  --   PLT 203  --   HEPARINUNFRC  --  0.49  CREATININE 0.85  --   TROPONINI <0.03  --     Estimated Creatinine Clearance: 54.9 mL/min (by C-G formula based on SCr of 0.85 mg/dL).   Medical History: Past Medical History:   Diagnosis Date  . Anemia    hx  . Anxiety and depression   . Arthritis    "~ all over my body; in the joints"  . Breast cancer, right ([place]) 3/16  . Coronary artery disease    a.  s/p NSTEMI in June 2017 with DES to mid LAD.  Marland Kitchen Depression   . GERD (gastroesophageal reflux disease)   . H/O hiatal hernia   . History of blood transfusion    "when taking chemo & w/one of my knee replacements" (11/05/2015)  . Hypercholesteremia   . Hypertension   . Orthostatic hypotension   . Personal history of chemotherapy   . Personal history of radiation therapy   . Pneumonia X 1  . Shingles 5/15   hx    Assessment: 68 yoF presenting with chest pain. Patient on plavix and aspirin PTA, no anticoagulation documented. Has hx of  CAD with previous stenting of the LAD bifurcation lesion in 2017.  Order for heparin entered to start at 1600 - was started at 16. Initial heparin level was drawn 3 hours after start which is 0.49, therapeutic, on 750 units/hr. Delay in start of infusion was related to IV access issues. Hgb is 11.5, plts WNL. No signs/symptoms of bleeding.  Goal of Therapy:  Heparin level 0.3-0.7 units/ml Monitor platelets by anticoagulation protocol: Yes   Plan:  Continue heparin infusion at 750 units/hr Check  anti-Xa level in 6 hours and daily while on heparin Continue to monitor H&H and platelets  Doylene Canard, PharmD Clinical Pharmacist  Pager: [phone] Phone: [phone] 07/31/2017,9:41 PM

## 2017-07-31 NOTE — ED Provider Notes (Signed)
[facility] EMERGENCY DEPARTMENT Provider Note   CSN: [id number] Arrival date & time: 07/31/17  1448     History   Chief Complaint Chief Complaint  Patient presents with  . Chest Pain    HPI Angela Bond is a 72 y.o. female.  HPI   72 year old female with chest pain.  Patient was doing some light cleaning in her home today she began having chest tightness, dyspnea and diaphoresis.  She received 2 nitroglycerin sublingually, 324 mg aspirin 4 mg of Zofran in route.  Symptoms minimally better at this point but still has some mild chest discomfort.  She reports having some new dyspnea with exertion over the past several days to about a week.  Past history non-STEMI with drug-eluting stent to her LAD in 2017.  Past Medical History:  Diagnosis Date  . Anemia    hx  . Anxiety and depression   . Arthritis    "~ all over my body; in the joints"  . Breast cancer, right ([place]) 3/16  . Coronary artery disease    a.  s/p NSTEMI in June 2017 with DES to mid LAD.  Marland Kitchen Depression   . GERD (gastroesophageal reflux disease)   . H/O hiatal hernia   . Heart murmur   . History of blood transfusion    "when taking chemo & w/one of my knee replacements" (11/05/2015)  . Hypercholesteremia   . Hypertension   . Orthostatic hypotension   . Personal history of chemotherapy   . Personal history of radiation therapy   . Pneumonia X 1  . Shingles 5/15   hx  . Shortness of breath    "stomach is in lungs" when had hernia 2014 none now(07/28/14)  . Sinus headache     Patient Active Problem List   Diagnosis Date Noted  . Iron deficiency anemia 02/16/2017  . CAD in native artery 01/16/2016  . Precordial pain 01/15/2016  . Hypotension 11/19/2015  . Cardiomyopathy, ischemic 11/19/2015  . Esophageal reflux   . Atypical chest pain   . NSTEMI (non-ST elevated myocardial infarction) ([place]) 11/05/2015  . Ataxia 09/15/2015  . Neutropenic fever ([place]) 12/24/2014  . Dehydration  12/24/2014  . Neutropenia with fever ([place]) 12/23/2014  . Anemia due to chemotherapy 12/16/2014  . Neutropenia ([place]) 10/21/2014  . UTI (lower urinary tract infection) 10/21/2014  . Hypokalemia 10/21/2014  . Fall 10/16/2014  . Breast cancer of upper-outer quadrant of right female breast ([place]) 09/09/2014  . Lumbar spinal stenosis 07/31/2014  . Shingles 09/23/2013  . Chest pressure 09/20/2013  . Hypotension due to drugs 09/20/2013  . GERD (gastroesophageal reflux disease)   . Depression   . Hypercholesteremia   . Arthritis   . Abdominal pain, unspecified site 10/12/2012  . Atrial fibrillation ([place]) 08/12/2012  . Hypertension   . Sliding hiatal hernia s/p lap PEH repair 07/31/2012  . Other and unspecified angina pectoris 01/23/2012    Past Surgical History:  Procedure Laterality Date  . BACK SURGERY    . BREAST BIOPSY Right   . BREAST LUMPECTOMY Right 08/2014  . BREAST LUMPECTOMY WITH SENTINEL LYMPH NODE BIOPSY Right 09/22/2014   Procedure: RIGHT BREAST LUMPECTOMY WITH NEEDLE LOCALIZATION AND RIGHT AXILLARY SENTINEL LYMPH NODE BIOPSY;  Surgeon: [name], MD;  Location: [facility];  Service: General;  Laterality: Right;  . CARDIAC CATHETERIZATION    . CARDIAC CATHETERIZATION N/A 11/05/2015   Procedure: Left Heart Cath and Coronary Angiography;  Surgeon: Belva Crome, MD;  Location:  [facility] INVASIVE CV LAB;  Service: Cardiovascular;  Laterality: N/A;  . CARDIAC CATHETERIZATION N/A 11/05/2015   Procedure: Coronary Stent Intervention;  Surgeon: Belva Crome, MD;  Location: [facility];  Service: Cardiovascular;  Laterality: N/A;  . CARPAL TUNNEL RELEASE Right 09/2015  . CORONARY ANGIOPLASTY WITH STENT PLACEMENT  11/05/2015   "1 stent"  . ESOPHAGEAL MANOMETRY N/A 07/02/2012   Procedure: ESOPHAGEAL MANOMETRY (EM);  Surgeon: Lear Ng, MD;  Location: [facility];  Service: Endoscopy;  Laterality: N/A;  schooler to read  . EYE SURGERY Left 2013   tube placed tear duct  . HERNIA  REPAIR    . HIATAL HERNIA REPAIR N/A 08/10/2012   Procedure: LAPAROSCOPIC REPAIR OF HIATAL HERNIA WITH MESH AND EGD WITH PEG TUBE PLACEMENT;  Surgeon: Ralene Ok, MD;  Location: [facility];  Service: General;  Laterality: N/A;  . JOINT REPLACEMENT    . PORT-A-CATH REMOVAL  06/2015  . PORTACATH PLACEMENT Left 09/22/2014   Procedure: INSERTION PORT-A-CATH;  Surgeon: [name], MD;  Location: [facility];  Service: General;  Laterality: Left;  . POSTERIOR LUMBAR FUSION  2016   "had 2 vertebrae fused"  . TONSILLECTOMY  as child  . TOTAL KNEE ARTHROPLASTY Bilateral 2011,2012    OB History    No data available       Home Medications    Prior to Admission medications   Medication Sig Start Date End Date Taking? Authorizing Provider  aspirin EC 81 MG EC tablet Take 1 tablet (81 mg total) by mouth daily. 11/06/15   Lyda Jester M, PA-C  calcium carbonate (OS-CAL) 600 MG TABS Take 600 mg by mouth 2 (two) times daily with a meal.    [provider]  carboxymethylcellulose (REFRESH PLUS) 0.5 % SOLN Place 1 drop into both eyes 4 (four) times daily.     [provider]  Cholecalciferol (VITAMIN D) 2000 UNITS tablet Take 2,000 Units by mouth 2 (two) times daily.     [provider]  clopidogrel (PLAVIX) 75 MG tablet TAKE 1 TABLET BY MOUTH  DAILY 12/26/16   Jerline Pain, MD  dexlansoprazole (DEXILANT) 60 MG capsule Take 60 mg by mouth daily.    [provider]  escitalopram (LEXAPRO) 20 MG tablet Take 20 mg by mouth daily. 01/22/16   [provider]  ezetimibe (ZETIA) 10 MG tablet TAKE 1 TABLET BY MOUTH  DAILY 06/12/17   Jerline Pain, MD  fexofenadine (ALLEGRA) 180 MG tablet Take 180 mg by mouth daily.    [provider]  furosemide (LASIX) 20 MG tablet Take 1 tablet (20 mg total) by mouth every other day. 04/04/17   Burtis Junes, NP  gabapentin (NEURONTIN) 300 MG capsule TAKE 1 CAPSULE BY MOUTH 3  TIMES DAILY 01/27/17   Hyatt, Max T, DPM    LORazepam (ATIVAN) 1 MG tablet Take 0.5-1 mg by mouth 2 (two) times daily as needed for anxiety. For sleeping per patient 12/08/15   [provider]  lovastatin (MEVACOR) 40 MG tablet TAKE 1 TABLET BY MOUTH AT  BEDTIME 06/12/17   Jerline Pain, MD  meclizine (ANTIVERT) 25 MG tablet Take 12.5-25 mg by mouth 3 (three) times daily as needed for dizziness.    [provider]  nitroGLYCERIN (NITROSTAT) 0.4 MG SL tablet Place 1 tablet (0.4 mg total) under the tongue every 5 (five) minutes as needed for chest pain (CP or SOB). 09/22/16   Jerline Pain, MD  ranitidine (  ZANTAC) 150 MG tablet Take 150 mg by mouth at bedtime.    [provider]  sodium chloride (OCEAN) 0.65 % SOLN nasal spray Place 2 sprays into both nostrils as needed for congestion.    [provider]    Family History Family History  Problem Relation Age of Onset  . Heart disease Mother        65s, CHF  . Heart disease Father        d/o MI at 78  . Cancer Father        skin  . Diabetes Father   . Cancer Sister        breast  . Diabetes Sister   . Heart disease Sister        CABG in mid-60s    Social History Social History   Tobacco Use  . Smoking status: Never Smoker  . Smokeless tobacco: Never Used  Substance Use Topics  . Alcohol use: No  . Drug use: No     Allergies   Shellfish allergy; Lactose intolerance (gi); Brilinta [ticagrelor]; Penicillins; Aleve [naproxen]; Aspirin; Ciprofloxacin; Doxycycline; Evista [raloxifene]; Fosamax [alendronate sodium]; Latex; Naproxen sodium; Nitrofurantoin; Pravachol [pravastatin]; Sulfa antibiotics; Tape; Welchol [colesevelam hcl]; and Zocor [simvastatin]   Review of Systems Review of Systems  All systems reviewed and negative, other than as noted in HPI.  Physical Exam Updated Vital Signs Ht 5' (1.524 m)   Wt 77.1 kg (170 lb)   SpO2 96%   BMI 33.20 kg/m   Physical Exam  Constitutional: She appears well-developed and  well-nourished. No distress.  HENT:  Head: Normocephalic and atraumatic.  Eyes: Conjunctivae are normal. Right eye exhibits no discharge. Left eye exhibits no discharge.  Neck: Neck supple.  Cardiovascular: Normal rate, regular rhythm and normal heart sounds. Exam reveals no gallop and no friction rub.  No murmur heard. Pulmonary/Chest: Effort normal and breath sounds normal. No respiratory distress.  Abdominal: Soft. She exhibits no distension. There is no tenderness.  Musculoskeletal: She exhibits edema. She exhibits no tenderness.  Asymmetric lower extremity edema, right greater than left.  No calf tenderness.  Negative Homans.  Neurological: She is alert.  Skin: Skin is warm and dry.  Psychiatric: She has a normal mood and affect. Her behavior is normal. Thought content normal.  Nursing note and vitals reviewed.    ED Treatments / Results  Labs (all labs ordered are listed, but only abnormal results are displayed) Labs Reviewed  MRSA PCR SCREENING - Abnormal; Notable for the following components:      Result Value   MRSA by PCR POSITIVE (*)    All other components within normal limits  CBC WITH DIFFERENTIAL/PLATELET - Abnormal; Notable for the following components:   Hemoglobin 11.5 (*)    All other components within normal limits  BASIC METABOLIC PANEL - Abnormal; Notable for the following components:   Chloride 100 (*)    Calcium 8.8 (*)    All other components within normal limits  HEPARIN LEVEL (UNFRACTIONATED) - Abnormal; Notable for the following components:   Heparin Unfractionated 0.14 (*)    All other components within normal limits  BASIC METABOLIC PANEL - Abnormal; Notable for the following components:   Glucose, Bld 165 (*)    All other components within normal limits  TROPONIN I  BRAIN NATRIURETIC PEPTIDE  HEPARIN LEVEL (UNFRACTIONATED)  CBC  TROPONIN I  TROPONIN I  TROPONIN I  LIPID PANEL  PROTIME-INR  CREATININE, SERUM  CBC  POCT ACTIVATED CLOTTING  TIME    EKG  EKG Interpretation  Date/Time:  Monday July 31 2017 15:10:36 EDT Ventricular Rate:  72 PR Interval:    QRS Duration: 69 QT Interval:  406 QTC Calculation: 445 R Axis:   65 Text Interpretation:  Sinus rhythm No significant change since last tracing Confirmed by Virgel Manifold [phone]) on 07/31/2017 4:53:46 PM       Radiology No results found.   Dg Chest 2 View  Result Date: 07/31/2017 CLINICAL DATA:  Chest pain EXAM: CHEST - 2 VIEW COMPARISON:  03/23/2016 FINDINGS: Mildly low lung volumes. No focal consolidation or pleural effusion. Stable cardiomediastinal silhouette. Aortic atherosclerosis. No pneumothorax. Surgical clips over the right breast. IMPRESSION: No active cardiopulmonary disease.  Low lung volumes. Electronically Signed   By: Donavan Foil M.D.   On: 07/31/2017 16:12    Procedures Procedures (including critical care time)  CRITICAL CARE Performed by: Virgel Manifold Total critical care time: 35 minutes Critical care time was exclusive of separately billable procedures and treating other patients. Critical care was necessary to treat or prevent imminent or life-threatening deterioration. Critical care was time spent personally by me on the following activities: development of treatment plan with patient and/or surrogate as well as nursing, discussions with consultants, evaluation of patient's response to treatment, examination of patient, obtaining history from patient or surrogate, ordering and performing treatments and interventions, ordering and review of laboratory studies, ordering and review of radiographic studies, pulse oximetry and re-evaluation of patient's condition.   Medications Ordered in ED Medications  nitroGLYCERIN 50 mg in dextrose 5 % 250 mL (0.2 mg/mL) infusion (not administered)     Initial Impression / Assessment and Plan / ED Course  I have reviewed the triage vital signs and the nursing notes.  Pertinent labs & imaging results  that were available during my care of the patient were reviewed by me and considered in my medical decision making (see chart for details).     72 year old female with chest pain.  Several typical features.  Concern for unstable angina.  She is still having some ongoing mild discomfort.  Concern for possible unstable angina. Will start nitroglycerin drip.  Heparin.  EKG does not appear to be acutely changed.  She will need admitted for ongoing evaluation.  Final Clinical Impressions(s) / ED Diagnoses   Final diagnoses:  Chest pain, unspecified type    ED Discharge Orders    None       Virgel Manifold, MD 08/02/17 [phone]

## 2017-07-31 NOTE — H&P (Signed)
History and Physical   Admit date: 07/31/2017 Name:  Angela Bond Medical record number: [id number] DOB/Age:  [birth year]  71 y.o. female  Referring Physician:   [facility]  Primary Cardiologist: Dr. Marlou Porch  Primary Physician:   Dr. Marisue Humble  Chief complaint/reason for admission: Chest pain  HPI:  This 72 year old female has a history of orthostatic hypotension as well as CAD.  She had a non-STEMI accompanied with stenting of the myocardium in June 2017.  At that time she had a 99% bifurcation lesion in the LAD that was stented and had a jailed diagonal that was dilated.  She had DAPT with Brilinta and this was later changed.  She has had issues with orthostatic hypotension and is had some issues with diaphoresis and nausea.  She had a myocardial perfusion scan done showing an EF of 68% and a repeat echo was remarkable for preservation of her LV function.  She has had reported statin intolerance and has had some resistant to taking some medicines previously.  She has noted some difficulty with dyspnea on going up a hill at her driveway over the past few weeks and also developed some edema particularly involving the right leg but also with the left leg.  She saw Dr. Marlou Porch in February.  Today she was cleaning house and had the onset of sweating and diaphoresis then accompanied by chest tightness.  She then became dizzy and short of breath and EMS was called.  After aspirin and nitroglycerin the discomfort resolved after about 30 minutes and she was transported here.  Initial EKG was unremarkable and troponins have been negative x1 thus far.  She was started on heparin and cardiology was asked to see her.   Past Medical History:  Diagnosis Date  . Anemia    hx  . Anxiety and depression   . Arthritis    "~ all over my body; in the joints"  . Breast cancer, right ([place]) 3/16  . Coronary artery disease    a.  s/p NSTEMI in June 2017 with DES to mid LAD.  Marland Kitchen Depression   . GERD  (gastroesophageal reflux disease)   . H/O hiatal hernia   . History of blood transfusion    "when taking chemo & w/one of my knee replacements" (11/05/2015)  . Hypercholesteremia   . Hypertension   . Orthostatic hypotension   . Personal history of chemotherapy   . Personal history of radiation therapy   . Pneumonia X 1  . Shingles 5/15   hx      Past Surgical History:  Procedure Laterality Date  . BREAST BIOPSY Right   . BREAST LUMPECTOMY Right 08/2014  . BREAST LUMPECTOMY WITH SENTINEL LYMPH NODE BIOPSY Right 09/22/2014   Procedure: RIGHT BREAST LUMPECTOMY WITH NEEDLE LOCALIZATION AND RIGHT AXILLARY SENTINEL LYMPH NODE BIOPSY;  Surgeon: [name], MD;  Location: [facility];  Service: General;  Laterality: Right;  . CARDIAC CATHETERIZATION    . CARDIAC CATHETERIZATION N/A 11/05/2015   Procedure: Left Heart Cath and Coronary Angiography;  Surgeon: Belva Crome, MD;  Location: [facility];  Service: Cardiovascular;  Laterality: N/A;  . CARDIAC CATHETERIZATION N/A 11/05/2015   Procedure: Coronary Stent Intervention;  Surgeon: Belva Crome, MD;  Location: [facility];  Service: Cardiovascular;  Laterality: N/A;  . CARPAL TUNNEL RELEASE Right 09/2015  . CORONARY ANGIOPLASTY WITH STENT PLACEMENT  11/05/2015   "1 stent"  . ESOPHAGEAL MANOMETRY N/A 07/02/2012   Procedure: ESOPHAGEAL  MANOMETRY (EM);  Surgeon: Lear Ng, MD;  [facility];  Service: Endoscopy;  Laterality: N/A;  schooler to read  . EYE SURGERY Left 2013   tube placed tear duct  . HIATAL HERNIA REPAIR N/A 08/10/2012   Procedure: LAPAROSCOPIC REPAIR OF HIATAL HERNIA WITH MESH AND EGD WITH PEG TUBE PLACEMENT;  Surgeon: Ralene Ok, MD;  [facility];  Service: General;  Laterality: N/A;  . PORT-A-CATH REMOVAL  06/2015  . PORTACATH PLACEMENT Left 09/22/2014   Procedure: INSERTION PORT-A-CATH;  Surgeon: [name], MD;  Location: [facility];  Service: General;  Laterality: Left;  . POSTERIOR LUMBAR  FUSION  2016   "had 2 vertebrae fused"  . TONSILLECTOMY  as child  . TOTAL KNEE ARTHROPLASTY Bilateral 2011,2012  .  Allergies: is allergic to shellfish allergy; lactose intolerance (gi); brilinta [ticagrelor]; penicillins; aleve [naproxen]; aspirin; ciprofloxacin; doxycycline; evista [raloxifene]; fosamax [alendronate sodium]; latex; naproxen sodium; nitrofurantoin; pravachol [pravastatin]; sulfa antibiotics; tape; welchol [colesevelam hcl]; and zocor [simvastatin].   Medications: Prior to Admission medications   Medication Sig Start Date End Date Taking? Authorizing Provider  acetaminophen (TYLENOL) 325 MG tablet Take 325 mg by mouth every 6 (six) hours as needed (for pain or headaches).   Yes [provider]  aspirin EC 81 MG EC tablet Take 1 tablet (81 mg total) by mouth daily. 11/06/15  Yes Lyda Jester M, PA-C  calcium carbonate (OS-CAL) 600 MG TABS Take 600 mg by mouth 2 (two) times daily with a meal.   Yes [provider]  carboxymethylcellulose (REFRESH PLUS) 0.5 % SOLN Place 1 drop into both eyes 4 (four) times daily.    Yes [provider]  Cholecalciferol (VITAMIN D) 2000 UNITS tablet Take 2,000 Units by mouth 2 (two) times daily.    Yes [provider]  clopidogrel (PLAVIX) 75 MG tablet TAKE 1 TABLET BY MOUTH  DAILY Patient taking differently: Take 75 mg by mouth once a day 12/26/16  Yes Skains, Thana Farr, MD  dexlansoprazole (DEXILANT) 60 MG capsule Take 60 mg by mouth daily.   Yes [provider]  escitalopram (LEXAPRO) 20 MG tablet Take 20 mg by mouth daily. 01/22/16  Yes [provider]  ezetimibe (ZETIA) 10 MG tablet TAKE 1 TABLET BY MOUTH  DAILY Patient taking differently: Take 10 mg by mouth at bedtime 06/12/17  Yes Jerline Pain, MD  fexofenadine (ALLEGRA) 180 MG tablet Take 180 mg by mouth daily.   Yes [provider]  furosemide (LASIX) 20 MG tablet Take 1 tablet (20 mg total) by mouth every other day.  04/04/17  Yes Burtis Junes, NP  gabapentin (NEURONTIN) 300 MG capsule TAKE 1 CAPSULE BY MOUTH 3  TIMES DAILY Patient taking differently: Take 300 mg by mouth three times a day 01/27/17  Yes Hyatt, Max T, DPM  LORazepam (ATIVAN) 1 MG tablet Take 0.5-1 mg by mouth 2 (two) times daily as needed for anxiety or sleep.  12/08/15  Yes [provider]  lovastatin (MEVACOR) 40 MG tablet TAKE 1 TABLET BY MOUTH AT  BEDTIME Patient taking differently: Take 40 mg by mouth at bedtime 06/12/17  Yes Jerline Pain, MD  meclizine (ANTIVERT) 25 MG tablet Take 12.5-25 mg by mouth 3 (three) times daily as needed for dizziness.   Yes [provider]  nitroGLYCERIN (NITROSTAT) 0.4 MG SL tablet Place 1 tablet (0.4 mg total) under the tongue every 5 (five) minutes as needed for chest pain (CP or SOB). 09/22/16  Yes Jerline Pain, MD  ranitidine (ZANTAC) 150 MG tablet Take 150 mg by mouth at bedtime.   Yes [provider]  sodium chloride (OCEAN) 0.65 % SOLN nasal spray Place 2 sprays into both nostrils as needed for congestion.   Yes [provider]    Family History:  Family Status  Relation Name Status  . Mother  Deceased  . Father  Deceased  . Sister  Deceased  . Sister  Alive  . MGM  Deceased  . MGF  Deceased  . PGM  Deceased  . PGF  Deceased    Social History:   reports that  has never smoked. she has never used smokeless tobacco. She reports that she does not drink alcohol or use drugs.   Social History   Social History Narrative   Lives with husband.  Does not use assist device.       Review of Systems: He has had some intermittent constipation.    Edema as noted above.  She has also had chronic low back pain.  She has a hiatal hernia and has had constipation.she has urinary frequency.  She has somewhat diminished vision and complains of episodic headaches.  Has intermittent dizzy spells. Other than as noted above, the remainder of the review of systems is  normal  Physical Exam: BP 130/79   Pulse 78   Resp 14   Ht 5' (1.524 m)   Wt 77.1 kg (170 lb)   SpO2 98%   BMI 33.20 kg/m  General appearance: Pleasant obese white female in no acute distress Head: Normocephalic, without obvious abnormality, atraumatic Eyes: conjunctivae/corneas clear. PERRL, EOM's intact. Fundi not examined  neck: no adenopathy, no carotid bruit, no JVD and supple, symmetrical, trachea midline Lungs: clear to auscultation bilaterally Heart: Regular rate and rhythm, normal S1-S2, no M8,4/1 to 2/6 systolic murmur at the left sternal border Abdomen: soft, non-tender; bowel sounds normal; no masses,  no organomegaly Pelvic: deferred Extremities:  there is 2+ edema of the right leg and 1+ edema of the left leg, full range of motion, no Homans sign, the right leg is slightly red. Pulses: 2+ and symmetric Skin: Skin color, texture, turgor normal. No rashes or lesions Neurologic: Grossly normal  Labs: CBC Recent Labs    07/31/17 1651  WBC 9.4  RBC 3.92  HGB 11.5*  HCT 36.5  PLT 203  MCV 93.1  MCH 29.3  MCHC 31.5  RDW 15.0  LYMPHSABS 1.6  MONOABS 0.6  EOSABS 0.4  BASOSABS 0.0   CMP  Recent Labs    07/31/17 1651  NA 140  K 3.6  CL 100*  CO2 25  GLUCOSE 90  BUN 6  CREATININE 0.85  CALCIUM 8.8*  GFRNONAA >60  GFRAA >60    BNP (last 3 results) Recent Labs    07/31/17 1651  BNP 47.8    ProBNP (last 3 results) Recent Labs    03/21/17 0935  PROBNP 177    Cardiac Panel (last 3 results) Recent Labs    07/31/17 1651  TROPONINI <0.03    EKG: No acute changes Independently reviewed by me  Radiology:    IMPRESSIONS: 1.  Chest pain consistent with unstable angina pectoris 2.  CAD with previous stenting of the LAD bifurcation lesion in 2017 3.  Hypertensive heart disease 4.  Aortic atherosclerosis 5.  History of breast cancer 6.  Obesity 7.  Edema 8.  History of orthostatic hypotension 9.  Reported history of contrast  allergy  10. Hyperlipidemia  PLAN: Patient is currently pain-free.  Initial EKG does not show ischemic changes and was personally reviewed by me.  Troponin is currently negative and cereals are pending.  At this point I think she needs to be continued on heparin and observed overnight with serial enzymes.  She has previously had a myocardial perfusion scan but with a suggestive history feel she will likely need repeat catheterization to exclude recurrent coronary artery disease.  Cardiac catheterization was discussed with the patient fully including risks of myocardial infarction, death, stroke, bleeding, arrhythmia, dye allergy, renal insufficiency or bleeding.  The patient understands and is willing to proceed.  Possibility of intervention at the same time also discussed with patient and husband and they understand and are agreeable to proceed   Signed: W. Doristine Church MD [facility]  07/31/2017, 8:12 PM No acute changes

## 2017-08-01 ENCOUNTER — Encounter (HOSPITAL_COMMUNITY): Payer: Self-pay | Admitting: Cardiology

## 2017-08-01 ENCOUNTER — Ambulatory Visit (HOSPITAL_COMMUNITY)
Admission: EM | Disposition: A | Discharge status: Home / Self Care | Payer: Self-pay | Source: Home / Self Care | Attending: Emergency Medicine

## 2017-08-01 ENCOUNTER — Inpatient Hospital Stay (HOSPITAL_COMMUNITY): Payer: Medicare Other

## 2017-08-01 ENCOUNTER — Telehealth: Payer: Self-pay | Admitting: Cardiology

## 2017-08-01 DIAGNOSIS — R072 Precordial pain: Secondary | ICD-10-CM | POA: Diagnosis not present

## 2017-08-01 DIAGNOSIS — I25110 Atherosclerotic heart disease of native coronary artery with unstable angina pectoris: Secondary | ICD-10-CM | POA: Diagnosis not present

## 2017-08-01 DIAGNOSIS — I200 Unstable angina: Secondary | ICD-10-CM

## 2017-08-01 HISTORY — PX: LEFT HEART CATH AND CORONARY ANGIOGRAPHY: CATH118249

## 2017-08-01 LAB — LIPID PANEL
CHOL/HDL RATIO: 2.6 ratio
Cholesterol: 153 mg/dL (ref 0–200)
HDL: 59 mg/dL (ref >40)
LDL CALC: 81 mg/dL (ref 0–99)
Triglycerides: 63 mg/dL (ref <150)
VLDL: 13 mg/dL (ref 0–40)

## 2017-08-01 LAB — HEPARIN LEVEL (UNFRACTIONATED): Heparin Unfractionated: 0.14 IU/mL — ABNORMAL LOW (ref 0.30–0.70)

## 2017-08-01 LAB — CBC
HCT: 41.5 % (ref 36.0–46.0)
HEMATOCRIT: 38.4 % (ref 36.0–46.0)
Hemoglobin: 12.1 g/dL (ref 12.0–15.0)
Hemoglobin: 13.4 g/dL (ref 12.0–15.0)
MCH: 29.2 pg (ref 26.0–34.0)
MCH: 29.8 pg (ref 26.0–34.0)
MCHC: 31.5 g/dL (ref 30.0–36.0)
MCHC: 32.3 g/dL (ref 30.0–36.0)
MCV: 92.8 fL (ref 78.0–100.0)
MCV: 92.2 fL (ref 78.0–100.0)
PLATELETS: 171 10*3/uL (ref 150–400)
PLATELETS: 199 10*3/uL (ref 150–400)
RBC: 4.14 MIL/uL (ref 3.87–5.11)
RBC: 4.5 MIL/uL (ref 3.87–5.11)
RDW: 14.8 % (ref 11.5–15.5)
RDW: 14.6 % (ref 11.5–15.5)
WBC: 5.8 10*3/uL (ref 4.0–10.5)
WBC: 6.4 10*3/uL (ref 4.0–10.5)

## 2017-08-01 LAB — TROPONIN I
Troponin I: <0.03 ng/mL (ref <0.03)
Troponin I: <0.03 ng/mL (ref <0.03)

## 2017-08-01 LAB — ECHOCARDIOGRAM COMPLETE
Height: 60 in
Weight: 2948.87 oz

## 2017-08-01 LAB — PROTIME-INR
INR: 1.05
PROTHROMBIN TIME: 13.6 s (ref 11.4–15.2)

## 2017-08-01 LAB — BASIC METABOLIC PANEL
ANION GAP: 13 (ref 5–15)
BUN: 6 mg/dL (ref 6–20)
CHLORIDE: 101 mmol/L (ref 101–111)
CO2: 25 mmol/L (ref 22–32)
Calcium: 8.9 mg/dL (ref 8.9–10.3)
Creatinine, Ser: 0.9 mg/dL (ref 0.44–1.00)
GFR calc Af Amer: >60 mL/min (ref >60)
GFR calc non Af Amer: >60 mL/min (ref >60)
GLUCOSE: 165 mg/dL — AB (ref 65–99)
POTASSIUM: 4.2 mmol/L (ref 3.5–5.1)
Sodium: 139 mmol/L (ref 135–145)

## 2017-08-01 LAB — MRSA PCR SCREENING: MRSA by PCR: POSITIVE — AB

## 2017-08-01 LAB — CREATININE, SERUM
CREATININE: 0.89 mg/dL (ref 0.44–1.00)
GFR calc Af Amer: >60 mL/min (ref >60)

## 2017-08-01 LAB — POCT ACTIVATED CLOTTING TIME: ACTIVATED CLOTTING TIME: 131 s

## 2017-08-01 SURGERY — LEFT HEART CATH AND CORONARY ANGIOGRAPHY
Anesthesia: LOCAL

## 2017-08-01 MED ORDER — MUPIROCIN 2 % EX OINT
1.0000 "application " | TOPICAL_OINTMENT | Freq: Two times a day (BID) | CUTANEOUS | Status: DC
  Administered 2017-08-01: 1 via NASAL
  Filled 2017-08-01: qty 22

## 2017-08-01 MED ORDER — ACETAMINOPHEN 325 MG PO TABS: 650.0000 mg | ORAL_TABLET | ORAL | Status: DC | PRN

## 2017-08-01 MED ORDER — METOPROLOL TARTRATE 25 MG PO TABS: 12.5000 mg | ORAL_TABLET | Freq: Two times a day (BID) | ORAL | 3 refills | Status: DC

## 2017-08-01 MED ORDER — SODIUM CHLORIDE 0.9 % IV SOLN: INTRAVENOUS | Status: AC

## 2017-08-01 MED ORDER — IOPAMIDOL (ISOVUE-370) INJECTION 76%
INTRAVENOUS | Status: AC
  Filled 2017-08-01: qty 100

## 2017-08-01 MED ORDER — SODIUM CHLORIDE 0.9% FLUSH: 3.0000 mL | Freq: Two times a day (BID) | INTRAVENOUS | Status: DC

## 2017-08-01 MED ORDER — LIDOCAINE HCL (PF) 1 % IJ SOLN
INTRAMUSCULAR | Status: AC
  Filled 2017-08-01: qty 30

## 2017-08-01 MED ORDER — HEPARIN SODIUM (PORCINE) 1000 UNIT/ML IJ SOLN
INTRAMUSCULAR | Status: AC
  Filled 2017-08-01: qty 1

## 2017-08-01 MED ORDER — CHLORHEXIDINE GLUCONATE CLOTH 2 % EX PADS: 6.0000 | MEDICATED_PAD | Freq: Every day | CUTANEOUS | Status: DC

## 2017-08-01 MED ORDER — HEPARIN (PORCINE) IN NACL 2-0.9 UNIT/ML-% IJ SOLN
INTRAMUSCULAR | Status: AC | PRN
  Administered 2017-08-01 (×2): 500 mL

## 2017-08-01 MED ORDER — MIDAZOLAM HCL 2 MG/2ML IJ SOLN
INTRAMUSCULAR | Status: DC | PRN
  Administered 2017-08-01: 1 mg via INTRAVENOUS

## 2017-08-01 MED ORDER — FENTANYL CITRATE (PF) 100 MCG/2ML IJ SOLN
INTRAMUSCULAR | Status: AC
  Filled 2017-08-01: qty 2

## 2017-08-01 MED ORDER — MUPIROCIN 2 % EX OINT: 1.0000 "application " | TOPICAL_OINTMENT | Freq: Two times a day (BID) | CUTANEOUS | Status: DC

## 2017-08-01 MED ORDER — ONDANSETRON HCL 4 MG/2ML IJ SOLN: 4.0000 mg | Freq: Four times a day (QID) | INTRAMUSCULAR | Status: DC | PRN

## 2017-08-01 MED ORDER — LIDOCAINE HCL (PF) 1 % IJ SOLN
INTRAMUSCULAR | Status: DC | PRN
  Administered 2017-08-01: 2 mL
  Administered 2017-08-01: 15 mL

## 2017-08-01 MED ORDER — MIDAZOLAM HCL 2 MG/2ML IJ SOLN
INTRAMUSCULAR | Status: AC
  Filled 2017-08-01: qty 2

## 2017-08-01 MED ORDER — IOPAMIDOL (ISOVUE-370) INJECTION 76%
INTRAVENOUS | Status: DC | PRN
  Administered 2017-08-01: 35 mL

## 2017-08-01 MED ORDER — FENTANYL CITRATE (PF) 100 MCG/2ML IJ SOLN
INTRAMUSCULAR | Status: DC | PRN
  Administered 2017-08-01: 25 ug via INTRAVENOUS

## 2017-08-01 MED ORDER — MIDAZOLAM HCL 2 MG/2ML IJ SOLN
INTRAMUSCULAR | Status: DC | PRN
  Administered 2017-08-01: 2 mg via INTRAVENOUS

## 2017-08-01 MED ORDER — VERAPAMIL HCL 2.5 MG/ML IV SOLN
INTRAVENOUS | Status: AC
  Filled 2017-08-01: qty 2

## 2017-08-01 MED ORDER — GABAPENTIN 300 MG PO CAPS
300.0000 mg | ORAL_CAPSULE | Freq: Three times a day (TID) | ORAL | Status: DC
  Administered 2017-08-01 (×2): 300 mg via ORAL
  Filled 2017-08-01 (×2): qty 1

## 2017-08-01 MED ORDER — HEPARIN SODIUM (PORCINE) 5000 UNIT/ML IJ SOLN: 5000.0000 [IU] | Freq: Three times a day (TID) | INTRAMUSCULAR | Status: DC

## 2017-08-01 MED ORDER — SODIUM CHLORIDE 0.9 % IV SOLN: 250.0000 mL | INTRAVENOUS | Status: DC | PRN

## 2017-08-01 MED ORDER — SODIUM CHLORIDE 0.9% FLUSH: 3.0000 mL | INTRAVENOUS | Status: DC | PRN

## 2017-08-01 MED ORDER — HEPARIN (PORCINE) IN NACL 2-0.9 UNIT/ML-% IJ SOLN
INTRAMUSCULAR | Status: AC
  Filled 2017-08-01: qty 1000

## 2017-08-01 SURGICAL SUPPLY — 15 items
CATH INFINITI 5FR JL4 (CATHETERS) ×1 IMPLANT
CATH INFINITI JR4 5F (CATHETERS) ×1 IMPLANT
COVER PRB 48X5XTLSCP FOLD TPE (BAG) IMPLANT
COVER PROBE 5X48 (BAG) ×2
GUIDEWIRE INQWIRE 1.5J.035X260 (WIRE) IMPLANT
INQWIRE 1.5J .035X260CM (WIRE) ×2
KIT HEART LEFT (KITS) ×2 IMPLANT
NDL PERC 21GX4CM (NEEDLE) IMPLANT
NEEDLE PERC 21GX4CM (NEEDLE) ×2 IMPLANT
PACK CARDIAC CATHETERIZATION (CUSTOM PROCEDURE TRAY) ×2 IMPLANT
SHEATH PINNACLE 5F 10CM (SHEATH) ×1 IMPLANT
SHEATH RAIN RADIAL 21G 6FR (SHEATH) ×1 IMPLANT
TRANSDUCER W/STOPCOCK (MISCELLANEOUS) ×2 IMPLANT
TUBING CIL FLEX 10 FLL-RA (TUBING) ×2 IMPLANT
WIRE EMERALD 3MM-J .035X150CM (WIRE) ×1 IMPLANT

## 2017-08-01 NOTE — Telephone Encounter (Signed)
The pt is being discharged today. Will call tomorrow. 

## 2017-08-01 NOTE — Interval H&P Note (Signed)
Cath Lab Visit (complete for each Cath Lab visit)  Clinical Evaluation Leading to the Procedure:   ACS: Yes.    Non-ACS:    Anginal Classification: CCS IV  Anti-ischemic medical therapy: Minimal Therapy (1 class of medications)  Non-Invasive Test Results: No non-invasive testing performed  Prior CABG: No previous CABG      History and Physical Interval Note:  08/01/2017 9:31 AM  Angela Bond  has presented today for surgery, with the diagnosis of unstable angina  The various methods of treatment have been discussed with the patient and family. After consideration of risks, benefits and other options for treatment, the patient has consented to  Procedure(s): LEFT HEART CATH AND CORONARY ANGIOGRAPHY (N/A) as a surgical intervention .  The patient's history has been reviewed, patient examined, no change in status, stable for surgery.  I have reviewed the patient's chart and labs.  Questions were answered to the patient's satisfaction.     Larae Grooms

## 2017-08-01 NOTE — Care Management CC44 (Signed)
Condition Code 44 Documentation Completed  Patient Details  Name: Angela Bond MRN: [id number] Date of Birth: May 31, 1945   Condition Code 44 given:  Yes Patient signature on Condition Code 44 notice:  Yes Documentation of 2 MD's agreement:  Yes Code 44 added to claim:  Yes    Kathy, Wahid, RN 08/01/2017, 5:16 PM

## 2017-08-01 NOTE — Progress Notes (Signed)
ANTICOAGULATION CONSULT NOTE - Follow Up Consult  Pharmacy Consult for Heparin Indication: chest pain/ACS  Patient Measurements: Height: 5' (152.4 cm) Weight: 184 lb 4.9 oz (83.6 kg) IBW/kg (Calculated) : 45.5 Heparin Dosing Weight: 63 Kg  Vital Signs: Temp: 98 F (36.7 C) (03/19 0355) Temp Source: Oral (03/19 0355) BP: 126/76 (03/19 0355) Pulse Rate: 60 (03/19 0355)  Labs: Recent Labs    07/31/17 1651 07/31/17 2044 07/31/17 2235 08/01/17 0355  HGB 11.5*  --   --  12.1  HCT 36.5  --   --  38.4  PLT 203  --   --  171  HEPARINUNFRC  --  0.49  --  0.14*  CREATININE 0.85  --   --   --   TROPONINI <0.03  --  <0.03  --     Estimated Creatinine Clearance: 57.3 mL/min (by C-G formula based on SCr of 0.85 mg/dL).   Medical History: Past Medical History:  Diagnosis Date  . Anemia    hx  . Anxiety and depression   . Arthritis    "~ all over my body; in the joints"  . Breast cancer, right ([place]) 3/16  . Coronary artery disease    a.  s/p NSTEMI in June 2017 with DES to mid LAD.  Marland Kitchen Depression   . GERD (gastroesophageal reflux disease)   . H/O hiatal hernia   . History of blood transfusion    "when taking chemo & w/one of my knee replacements" (11/05/2015)  . Hypercholesteremia   . Hypertension   . Orthostatic hypotension   . Personal history of chemotherapy   . Personal history of radiation therapy   . Pneumonia X 1  . Shingles 5/15   hx    Assessment: 54 yoF presenting with chest pain for heparin. Patient on plavix and aspirin PTA, no anticoagulation documented. Has hx of  CAD with previous stenting of the LAD bifurcation lesion in 2017.  3/19 AM: heparin level is sub-therapeutic, CBC good  Goal of Therapy:  Heparin level 0.3-0.7 units/ml Monitor platelets by anticoagulation protocol: Yes   Plan:  Inc heparin to 900 units/hr 1400 HL  Narda Bonds, PharmD, BCPS Clinical Pharmacist Phone: [phone]

## 2017-08-01 NOTE — Telephone Encounter (Signed)
New Message:    TOC appt with Truitt Merle on 3/27 per Sharee Pimple NP

## 2017-08-01 NOTE — Discharge Summary (Signed)
Discharge Summary    Patient ID: Angela Bond,  MRN: [id number], DOB/AGE: Oct 29, 1945 72 y.o.  Admit date: 07/31/2017 Discharge date: 08/01/2017  Primary Care Provider: Gaynelle Arabian Primary Cardiologist: Candee Furbish, MD  Discharge Diagnoses    Principal Problem:   Unstable angina [facility] Active Problems:   Hypertension   Hypercholesteremia   Orthostatic hypotension   CAD (coronary artery disease), native coronary artery  Allergies Allergies  Allergen Reactions  . Shellfish Allergy Anaphylaxis    ANAPHYLAXIS TO OYSTERS  . Lactose Intolerance (Gi) Nausea And Vomiting  . Brilinta [Ticagrelor] Other (See Comments)    Indigestion  . Penicillins Nausea And Vomiting    Has patient had a PCN reaction causing immediate rash, facial/tongue/throat swelling, SOB or lightheadedness with hypotension: Yes Has patient had a PCN reaction causing severe rash involving mucus membranes or skin necrosis: No Has patient had a PCN reaction that required hospitalization: No Has patient had a PCN reaction occurring within the last 10 years: No If all of the above answers are "NO", then may proceed with Cephalosporin use.   [name] [Naproxen] Nausea And Vomiting  . Aspirin Other (See Comments)    Burns stomach  . Ciprofloxacin Nausea And Vomiting  . Doxycycline Nausea And Vomiting  . Evista [Raloxifene] Other (See Comments)    LEG ACHES  . Fosamax [Alendronate Sodium] Other (See Comments)    Leg aches  . Latex Rash  . Naproxen Sodium Nausea And Vomiting  . Nitrofurantoin Rash    "I broke out down there"  . Pravachol [Pravastatin] Other (See Comments)    GI upset   . Sulfa Antibiotics Nausea And Vomiting  . Tape Rash  . Welchol [Colesevelam Hcl] Other (See Comments)    Stomach cramps  . Zocor [Simvastatin] Rash    Diagnostic Studies/Procedures    Cardiac catheterization 07/31/17:   Mid RCA lesion is 25% stenosed.  Previously placed Prox LAD stent, DES,is widely  patent.  Ost 1st Diag lesion is 75% stenosed. The ostium is jailed by the stent. THis is a small vessel.  Mid LAD lesion is 25% stenosed.  The left ventricular systolic function is normal.  LV end diastolic pressure is mildly elevated.  The left ventricular ejection fraction is 55-65% by visual estimate.  There is no aortic valve stenosis.   Continue medical therapy.  I encouraged her to be more active to try to prevent deconditioning.   History of Present Illness     Ms. Angela Bond is a 75yoF with a hx of CAD s/p non-STEMI with drug-eluting stent to her LAD in 2017 (99% bifurcation lesion in the LAD that was stented and had a jailed diagonal that was dilated), orthostatic hypotension, right breast CA, GERD, HTN and HLD who presented to [facility] on 07/31/17 with complaints of chest tightness with associated diaphoresis, dizziness, and shortness of breath while cleaning her house. Upon interview, she additionally reports moderate exercise intolerance over the last several weeks including difficulty with dyspnea going up her driveway (hilled) and BLE edema however, has otherwise been in her usual state of health. She reported taking one ASA and SL NTG given her symptoms, which resolved after approximately 30 minutes time. She ultimately called EMS who transported her to the ED for further evaluation.  In route to the ED, she received 2 additional SL NTG, 324 mg ASA, and 4 mg Zofran. Upon arrival to the ED, her symptoms were decreased once again, however still present. Her troponin levels remained negative at <  0.03, <0.03. Her EKG was unremarkable, negative for acute changes. CXR with no active cardiopulmonary disease. Given her history and ACS symptoms, she was started on a Hep gtt and cardiology was asked to see her.   Of note, she has a history of orthostatic hypotension with associated diaphoresis and nausea, although her BP's have actually been elevated during her hospital admission. Per chart  review, it appears that she had a myocardial perfusion scan in 01/2016 which showed a fixed defect and he septal area consistent with scar/infarction, no reversible defects to suggest ischemia and an EF of 68%. A repeat echo which was performed at this time and was remarkable for preservation of her LV function. In the past, she has reported statin intolerance and has been somewhat resistant to medication therapies. She last saw Dr. Marlou Porch in 06/2017 with no significant changes.    Hospital Course   On 08/01/2017, she was taken to the cardiac cath lab which essentially revealed an unremarkable procedure. There was a mid RCA lesion which is 25% stenosed, previously placed proximal LAD stent which was widely patent, ostial first diagonal lesion that is 75% stenosed. The ostium is jailed by the previosly placed stent. There is a  mid LAD lesion which is 25% stenosed.  The left ventricular systolic function was normal, LV end-diastolic pressure was mildly elevated and her LVEF was estimated at 55-65%. There is no aortic valve stenosis. The plan is to continue medical therapy and encourage more activity to prevent deconditioning.    During her hospital stay, a lipid panel was obtained which revealed elevated cholesterol at 153, HDL 59, LDL 81 and Trigs 63.  She will need to continue her statin. Her electrolytes and kidney function were within normal limits. Her blood pressure remained mildly elevated at 130/65, 143/70, 146/57, therefore she was started on metoprolol 12.5 mg BID.  =Given her history of depression and new beta-blocker, she will need to be monitor for increased depressive symptoms. Her Lexapro was restarted this admission. This will need reassessment at her follow-up visit.  The patient was seen and examined by Dr. Debara Pickett who feels that she is stable and ready for discharge on 08/01/2017 after post cath bedrest time complete.  She will be set up with follow-up appointment and all medication will be  reviewed.  Consultants: None  General: Well developed, well nourished, NAD Skin: Warm, dry, intact  Head: Normocephalic, atraumatic, clear, moist mucus membranes. Neck: Negative for carotid bruits. No JVD Lungs:Clear to ausculation bilaterally. No wheezes, rales, or rhonchi. Breathing is unlabored. Cardiovascular: RRR with S1 S2. No murmurs, rubs, or gallops Abdomen: Soft, non-tender, non-distended with normoactive bowel sounds.  No obvious abdominal masses. MSK: Strength and tone appear normal for age. 5/5 in all extremities Extremities: No edema. No clubbing or cyanosis. DP/PT pulses 2+ bilaterally Neuro: Alert and oriented. No focal deficits. No facial asymmetry. MAE spontaneously. Psych: Responds to questions appropriately with normal affect.   _____________  Discharge Vitals Blood pressure 130/65, pulse (!) 55, temperature 98.6 F (37 C), temperature source Oral, resp. rate 15, height 5' (1.524 m), weight 184 lb 4.9 oz (83.6 kg), SpO2 95 %.  Filed Weights   07/31/17 1455 07/31/17 2153 08/01/17 0355  Weight: 170 lb (77.1 kg) 184 lb 4.9 oz (83.6 kg) 184 lb 4.9 oz (83.6 kg)    Labs & Radiologic Studies    CBC Recent Labs    07/31/17 1651 08/01/17 0355  WBC 9.4 5.8  NEUTROABS 6.9  --   HGB  11.5* 12.1  HCT 36.5 38.4  MCV 93.1 92.8  PLT 203 196   Basic Metabolic Panel Recent Labs    07/31/17 1651 08/01/17 0355 08/01/17 1129  NA 140 139  --   K 3.6 4.2  --   CL 100* 101  --   CO2 25 25  --   GLUCOSE 90 165*  --   BUN 6 6  --   CREATININE 0.85 0.90 0.89  CALCIUM 8.8* 8.9  --     Cardiac Enzymes Recent Labs    07/31/17 2235 08/01/17 0355 08/01/17 1129  TROPONINI <0.03 <0.03 <0.03   Fasting Lipid Panel Recent Labs    08/01/17 0355  CHOL 153  HDL 59  LDLCALC 81  TRIG 63  CHOLHDL 2.6  ____________  Dg Chest 2 View  Result Date: 07/31/2017 CLINICAL DATA:  Chest pain EXAM: CHEST - 2 VIEW COMPARISON:  03/23/2016 FINDINGS: Mildly low lung volumes. No  focal consolidation or pleural effusion. Stable cardiomediastinal silhouette. Aortic atherosclerosis. No pneumothorax. Surgical clips over the right breast. IMPRESSION: No active cardiopulmonary disease.  Low lung volumes. Electronically Signed   By: Donavan Foil M.D.   On: 07/31/2017 16:12   Disposition   Pt is being discharged home today in good condition.  Follow-up Plans & Appointments    Follow-up Information    Burtis Junes, NP Follow up on 08/09/2017.   Specialties:  Nurse Practitioner, Interventional Cardiology, Cardiology, Radiology Why:  Your appointment is on 08/09/2017 at 10 AM with Truitt Merle, nurse practitioner with Dr. Marlou Porch.  Please arrive to your appointment at 9:45 AM.  Thank you Contact information: [place] [place] [address] [phone]          Discharge Instructions    Call MD for:  difficulty breathing, headache or visual disturbances   Complete by:  As directed    Call MD for:  redness, tenderness, or signs of infection (pain, swelling, redness, odor or green/yellow discharge around incision site)   Complete by:  As directed    Call MD for:  severe uncontrolled pain   Complete by:  As directed    Call MD for:  temperature >100.4   Complete by:  As directed    Diet - low sodium heart healthy   Complete by:  As directed    Discharge instructions   Complete by:  As directed    If you notice any bleeding such as blood in stool, black tarry stools, blood in urine, nosebleeds or any other unusual bleeding, call your doctor immediately.  No driving for 3 days. No lifting over 5 lbs for 1 week. No sexual activity for 1 week. Keep procedure site clean & dry. If you notice increased pain, swelling, bleeding or pus, call/return!  You may shower, but no soaking baths/hot tubs/pools for 1 week.   The patient was instructed to monitor their blood pressure at home and to call if tending to run high.  Please monitor for increased signs  of depression and let a healthcare provider know if you feel that they are worsening.   Increase activity slowly   Complete by:  As directed      Discharge Medications   Allergies as of 08/01/2017      Reactions   Shellfish Allergy Anaphylaxis   ANAPHYLAXIS TO OYSTERS   Lactose Intolerance (gi) Nausea And Vomiting   Brilinta [ticagrelor] Other (See Comments)   Indigestion   Penicillins Nausea And Vomiting  Has patient had a PCN reaction causing immediate rash, facial/tongue/throat swelling, SOB or lightheadedness with hypotension: Yes Has patient had a PCN reaction causing severe rash involving mucus membranes or skin necrosis: No Has patient had a PCN reaction that required hospitalization: No Has patient had a PCN reaction occurring within the last 10 years: No If all of the above answers are "NO", then may proceed with Cephalosporin use.   Aleve [naproxen] Nausea And Vomiting   Aspirin Other (See Comments)   Burns stomach   Ciprofloxacin Nausea And Vomiting   Doxycycline Nausea And Vomiting   Evista [raloxifene] Other (See Comments)   LEG ACHES   Fosamax [alendronate Sodium] Other (See Comments)   Leg aches   Latex Rash   Naproxen Sodium Nausea And Vomiting   Nitrofurantoin Rash   "I broke out down there"   Pravachol [pravastatin] Other (See Comments)   GI upset   Sulfa Antibiotics Nausea And Vomiting   Tape Rash   Welchol [colesevelam Hcl] Other (See Comments)   Stomach cramps   Zocor [simvastatin] Rash      Medication List    TAKE these medications   acetaminophen 325 MG tablet Commonly known as:  TYLENOL Take 325 mg by mouth every 6 (six) hours as needed (for pain or headaches).   aspirin 81 MG EC tablet Take 1 tablet (81 mg total) by mouth daily.   calcium carbonate 600 MG Tabs tablet Commonly known as:  OS-CAL Take 600 mg by mouth 2 (two) times daily with a meal.   carboxymethylcellulose 0.5 % Soln Commonly known as:  REFRESH PLUS Place 1 drop into  both eyes 4 (four) times daily.   clopidogrel 75 MG tablet Commonly known as:  PLAVIX TAKE 1 TABLET BY MOUTH  DAILY What changed:    how much to take  how to take this  when to take this   dexlansoprazole 60 MG capsule Commonly known as:  DEXILANT Take 60 mg by mouth daily.   escitalopram 20 MG tablet Commonly known as:  LEXAPRO Take 20 mg by mouth daily.   ezetimibe 10 MG tablet Commonly known as:  ZETIA TAKE 1 TABLET BY MOUTH  DAILY What changed:    how much to take  how to take this  when to take this   fexofenadine 180 MG tablet Commonly known as:  ALLEGRA Take 180 mg by mouth daily.   furosemide 20 MG tablet Commonly known as:  LASIX Take 1 tablet (20 mg total) by mouth every other day.   gabapentin 300 MG capsule Commonly known as:  NEURONTIN TAKE 1 CAPSULE BY MOUTH 3  TIMES DAILY What changed:    how much to take  how to take this  when to take this   LORazepam 1 MG tablet Commonly known as:  ATIVAN Take 0.5-1 mg by mouth 2 (two) times daily as needed for anxiety or sleep.   lovastatin 40 MG tablet Commonly known as:  MEVACOR TAKE 1 TABLET BY MOUTH AT  BEDTIME What changed:    how much to take  how to take this  when to take this   meclizine 25 MG tablet Commonly known as:  ANTIVERT Take 12.5-25 mg by mouth 3 (three) times daily as needed for dizziness.   metoprolol tartrate 25 MG tablet Commonly known as:  LOPRESSOR Take 0.5 tablets (12.5 mg total) by mouth 2 (two) times daily.   nitroGLYCERIN 0.4 MG SL tablet Commonly known as:  NITROSTAT Place 1 tablet (0.4  mg total) under the tongue every 5 (five) minutes as needed for chest pain (CP or SOB).   ranitidine 150 MG tablet Commonly known as:  ZANTAC Take 150 mg by mouth at bedtime.   sodium chloride 0.65 % Soln nasal spray Commonly known as:  OCEAN Place 2 sprays into both nostrils as needed for congestion.   Vitamin D 2000 units tablet Take 2,000 Units by mouth 2 (two)  times daily.       Outstanding Labs/Studies   None  Duration of Discharge Encounter   Greater than 30 minutes including physician time.  SignedKathyrn Drown NP 08/01/2017, 1:56 PM

## 2017-08-01 NOTE — Discharge Instructions (Signed)

## 2017-08-01 NOTE — Care Management Obs Status (Signed)
[facility] NOTIFICATION   Patient Details  Name: ODELL FASCHING MRN: [id number] Date of Birth: 1945-12-09   Medicare Observation Status Notification Given:  Yes    Kayana, Thoen, RN 08/01/2017, 5:16 PM

## 2017-08-01 NOTE — Progress Notes (Signed)
  Echocardiogram 2D Echocardiogram has been performed.  Jennette Dubin 08/01/2017, 2:57 PM

## 2017-08-02 MED FILL — Heparin Sodium (Porcine) 2 Unit/ML in Sodium Chloride 0.9%: INTRAMUSCULAR | Qty: 1000 | Status: AC

## 2017-08-02 MED FILL — Verapamil HCl IV Soln 2.5 MG/ML: INTRAVENOUS | Qty: 2 | Status: AC

## 2017-08-02 MED FILL — Heparin Sodium (Porcine) Inj 1000 Unit/ML: INTRAMUSCULAR | Qty: 10 | Status: AC

## 2017-08-02 NOTE — Telephone Encounter (Signed)
**Note De-Identified Laine Fonner Obfuscation** LMTCB on cell phone and the pts home phone has been busy X 3. Will call later.

## 2017-08-03 ENCOUNTER — Emergency Department (HOSPITAL_COMMUNITY)
Admission: EM | Admit: 2017-08-03 | Discharge: 2017-08-03 | Disposition: A | Discharge status: Home / Self Care | Payer: Medicare Other | Attending: Emergency Medicine | Admitting: Emergency Medicine

## 2017-08-03 ENCOUNTER — Emergency Department (HOSPITAL_COMMUNITY): Payer: Medicare Other

## 2017-08-03 ENCOUNTER — Telehealth: Payer: Self-pay | Admitting: Cardiology

## 2017-08-03 ENCOUNTER — Encounter (HOSPITAL_COMMUNITY): Payer: Self-pay

## 2017-08-03 ENCOUNTER — Other Ambulatory Visit: Payer: Self-pay

## 2017-08-03 DIAGNOSIS — Z853 Personal history of malignant neoplasm of breast: Secondary | ICD-10-CM | POA: Insufficient documentation

## 2017-08-03 DIAGNOSIS — W0110XA Fall on same level from slipping, tripping and stumbling with subsequent striking against unspecified object, initial encounter: Secondary | ICD-10-CM | POA: Diagnosis not present

## 2017-08-03 DIAGNOSIS — Y999 Unspecified external cause status: Secondary | ICD-10-CM | POA: Insufficient documentation

## 2017-08-03 DIAGNOSIS — W19XXXA Unspecified fall, initial encounter: Secondary | ICD-10-CM

## 2017-08-03 DIAGNOSIS — I2510 Atherosclerotic heart disease of native coronary artery without angina pectoris: Secondary | ICD-10-CM | POA: Insufficient documentation

## 2017-08-03 DIAGNOSIS — S098XXA Other specified injuries of head, initial encounter: Secondary | ICD-10-CM | POA: Diagnosis not present

## 2017-08-03 DIAGNOSIS — I10 Essential (primary) hypertension: Secondary | ICD-10-CM | POA: Diagnosis not present

## 2017-08-03 DIAGNOSIS — Y9389 Activity, other specified: Secondary | ICD-10-CM | POA: Insufficient documentation

## 2017-08-03 DIAGNOSIS — R0789 Other chest pain: Secondary | ICD-10-CM | POA: Insufficient documentation

## 2017-08-03 DIAGNOSIS — Y92009 Unspecified place in unspecified non-institutional (private) residence as the place of occurrence of the external cause: Secondary | ICD-10-CM | POA: Insufficient documentation

## 2017-08-03 MED ORDER — LORAZEPAM 1 MG PO TABS
0.5000 mg | ORAL_TABLET | Freq: Once | ORAL | Status: AC
  Administered 2017-08-03: 0.5 mg via ORAL
  Filled 2017-08-03: qty 1

## 2017-08-03 NOTE — ED Notes (Signed)
Patient decided to stay. 

## 2017-08-03 NOTE — ED Notes (Signed)
Patient is leaving per husband due to wait.

## 2017-08-03 NOTE — Telephone Encounter (Signed)
Patient contacted on 08/03/2017 @ 12:44pm Patient understands to follow up with  Provider on 08/09/2017 @ 10am with Truitt Merle NP Patient understands discharge instructions? yes Patient understands medications and regimen? She has all her medications except one she cant remember what it is or what its for, she is picking up today. Patient understands to bring all medications to this visit?  Patient states she is "shakey" and her legs/feet are swollen. She states she takes her lasix every other day, she is not having any chest pain. Hospital discharge noted BLE.  I ask her to check her B/P it was 120/83 with HR 70. I ask her about pitting edema, she said just a little. She states she is at [facility]".  I instructed her to elevate her legs when she gets home, she confirms.  Will route note to Dr Marlou Porch and Kelli Churn, RN

## 2017-08-03 NOTE — ED Triage Notes (Signed)
Pt presents with complaints of tripping and falling today, reports being on blood thinners since leaving the hospital last week.  Denies any LOC or confusion. Pt is ambulatory.

## 2017-08-03 NOTE — ED Provider Notes (Signed)
[facility] Provider Note   CSN: [id number] Arrival date & time: 08/03/17  1418     History   Chief Complaint Chief Complaint  Patient presents with  . Fall    HPI Angela Bond is a 72 y.o. female.  The history is provided by the patient.  Fall  This is a recurrent problem. The current episode started 6 to 12 hours ago. Pertinent negatives include no chest pain, no abdominal pain, no headaches and no shortness of breath.  -Patient states she fell backwards at the trip in stopping on her floor.  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  Patient recently discharged after an ACS evaluation and had a heart cath did not show any obstructive disease.  Patient states she fell and hit the back of her head and her right lateral chest wall.  She denies any neck or back pain or any hip or extremity pain.  She denies abdominal pain.  She takes aspirin and Plavix.  Denies loss of consciousness.  Past Medical History:  Diagnosis Date  . Anemia    hx  . Anxiety and depression   . Arthritis    "~ all over my body; in the joints"  . Breast cancer, right ([place]) 3/16  . Coronary artery disease    a.  s/p NSTEMI in June 2017 with DES to mid LAD.  [place] Kitchen Depression   . GERD (gastroesophageal reflux disease)   . H/O hiatal hernia   . History of blood transfusion    "when taking chemo & w/one of my knee replacements" (11/05/2015)  . Hypercholesteremia   . Hypertension   . Orthostatic hypotension   . Personal history of chemotherapy   . Personal history of radiation therapy   . Pneumonia X 1  . Shingles 5/15   hx    Patient Active Problem List   Diagnosis Date Noted  . Obesity (BMI 30-39.9) 07/31/2017  . Aortic atherosclerosis ([place]) 07/31/2017  . Iron deficiency anemia 02/16/2017  . CAD (coronary artery disease), native coronary artery 01/16/2016  . Orthostatic hypotension 11/19/2015  . Breast cancer of upper-outer quadrant of right  female breast ([place]) 09/09/2014  . Lumbar spinal stenosis 07/31/2014  . GERD (gastroesophageal reflux disease)   . Depression   . Hypercholesteremia   . Arthritis   . Paroxysmal atrial fibrillation ([place]) 08/12/2012  . Hypertension   . Sliding hiatal hernia s/p lap PEH repair 07/31/2012  . Unstable angina ([place]) 01/23/2012    Past Surgical History:  Procedure Laterality Date  . BREAST BIOPSY Right   . BREAST LUMPECTOMY Right 08/2014  . BREAST LUMPECTOMY WITH SENTINEL LYMPH NODE BIOPSY Right 09/22/2014   Procedure: RIGHT BREAST LUMPECTOMY WITH NEEDLE LOCALIZATION AND RIGHT AXILLARY SENTINEL LYMPH NODE BIOPSY;  Surgeon: [name], MD;  Location: [facility];  Service: General;  Laterality: Right;  . CARDIAC CATHETERIZATION    . CARDIAC CATHETERIZATION N/A 11/05/2015   Procedure: Left Heart Cath and Coronary Angiography;  Surgeon: Belva Crome, MD;  Location: [facility];  Service: Cardiovascular;  Laterality: N/A;  . CARDIAC CATHETERIZATION N/A 11/05/2015   Procedure: Coronary Stent Intervention;  Surgeon: Belva Crome, MD;  Location: [facility];  Service: Cardiovascular;  Laterality: N/A;  . CARPAL TUNNEL RELEASE Right 09/2015  . CORONARY ANGIOPLASTY WITH STENT PLACEMENT  11/05/2015   "1 stent"  . ESOPHAGEAL MANOMETRY N/A 07/02/2012   Procedure: ESOPHAGEAL MANOMETRY (EM);  Surgeon: Evette Doffing  Aloha Gell, MD;  Location: [facility];  Service: Endoscopy;  Laterality: N/A;  schooler to read  . EYE SURGERY Left 2013   tube placed tear duct  . HIATAL HERNIA REPAIR N/A 08/10/2012   Procedure: LAPAROSCOPIC REPAIR OF HIATAL HERNIA WITH MESH AND EGD WITH PEG TUBE PLACEMENT;  Surgeon: Ralene Ok, MD;  Location: [facility];  Service: General;  Laterality: N/A;  . LEFT HEART CATH AND CORONARY ANGIOGRAPHY N/A 08/01/2017   Procedure: LEFT HEART CATH AND CORONARY ANGIOGRAPHY;  Surgeon: Jettie Booze, MD;  Location: [facility];  Service: Cardiovascular;  Laterality: N/A;  .  PORT-A-CATH REMOVAL  06/2015  . PORTACATH PLACEMENT Left 09/22/2014   Procedure: INSERTION PORT-A-CATH;  Surgeon: [name], MD;  Location: [facility];  Service: General;  Laterality: Left;  . POSTERIOR LUMBAR FUSION  2016   "had 2 vertebrae fused"  . TONSILLECTOMY  as child  . TOTAL KNEE ARTHROPLASTY Bilateral 2011,2012    OB History   None      Home Medications    Prior to Admission medications   Medication Sig Start Date End Date Taking? Authorizing Provider  acetaminophen (TYLENOL) 325 MG tablet Take 325 mg by mouth every 6 (six) hours as needed (for pain or headaches).    [provider]  aspirin EC 81 MG EC tablet Take 1 tablet (81 mg total) by mouth daily. 11/06/15   Lyda Jester M, PA-C  calcium carbonate (OS-CAL) 600 MG TABS Take 600 mg by mouth 2 (two) times daily with a meal.    [provider]  carboxymethylcellulose (REFRESH PLUS) 0.5 % SOLN Place 1 drop into both eyes 4 (four) times daily.     [provider]  Cholecalciferol (VITAMIN D) 2000 UNITS tablet Take 2,000 Units by mouth 2 (two) times daily.     [provider]  clopidogrel (PLAVIX) 75 MG tablet TAKE 1 TABLET BY MOUTH  DAILY Patient taking differently: Take 75 mg by mouth once a day 12/26/16   Jerline Pain, MD  dexlansoprazole (DEXILANT) 60 MG capsule Take 60 mg by mouth daily.    [provider]  escitalopram (LEXAPRO) 20 MG tablet Take 20 mg by mouth daily. 01/22/16   [provider]  ezetimibe (ZETIA) 10 MG tablet TAKE 1 TABLET BY MOUTH  DAILY Patient taking differently: Take 10 mg by mouth at bedtime 06/12/17   Jerline Pain, MD  fexofenadine (ALLEGRA) 180 MG tablet Take 180 mg by mouth daily.    [provider]  furosemide (LASIX) 20 MG tablet Take 1 tablet (20 mg total) by mouth every other day. 04/04/17   Burtis Junes, NP  gabapentin (NEURONTIN) 300 MG capsule TAKE 1 CAPSULE BY MOUTH 3  TIMES DAILY Patient taking differently: Take 300 mg  by mouth three times a day 01/27/17   Hyatt, Max T, DPM  LORazepam (ATIVAN) 1 MG tablet Take 0.5-1 mg by mouth 2 (two) times daily as needed for anxiety or sleep.  12/08/15   [provider]  lovastatin (MEVACOR) 40 MG tablet TAKE 1 TABLET BY MOUTH AT  BEDTIME Patient taking differently: Take 40 mg by mouth at bedtime 06/12/17   Jerline Pain, MD  meclizine (ANTIVERT) 25 MG tablet Take 12.5-25 mg by mouth 3 (three) times daily as needed for dizziness.    [provider]  metoprolol tartrate (LOPRESSOR) 25 MG tablet Take 0.5 tablets (12.5 mg total) by mouth 2 (two) times daily. 08/01/17   Glenford Peers,  Alphonsa Overall, NP  nitroGLYCERIN (NITROSTAT) 0.4 MG SL tablet Place 1 tablet (0.4 mg total) under the tongue every 5 (five) minutes as needed for chest pain (CP or SOB). 09/22/16   Jerline Pain, MD  ranitidine (ZANTAC) 150 MG tablet Take 150 mg by mouth at bedtime.    [provider]  sodium chloride (OCEAN) 0.65 % SOLN nasal spray Place 2 sprays into both nostrils as needed for congestion.    [provider]    Family History Family History  Problem Relation Age of Onset  . Heart disease Mother        64s, CHF  . Heart disease Father        d/o MI at 67  . Cancer Father        skin  . Diabetes Father   . Cancer Sister        breast  . Diabetes Sister   . Heart disease Sister        CABG in mid-60s    Social History Social History   Tobacco Use  . Smoking status: Never Smoker  . Smokeless tobacco: Never Used  Substance Use Topics  . Alcohol use: No  . Drug use: No     Allergies   Shellfish allergy; Lactose intolerance (gi); Brilinta [ticagrelor]; Penicillins; Aleve [naproxen]; Aspirin; Ciprofloxacin; Doxycycline; Evista [raloxifene]; Fosamax [alendronate sodium]; Latex; Naproxen sodium; Nitrofurantoin; Pravachol [pravastatin]; Sulfa antibiotics; Tape; Welchol [colesevelam hcl]; and Zocor [simvastatin]   Review of Systems Review of Systems    Constitutional: Negative for chills and fever.  HENT: Negative for ear pain and sore throat.   Eyes: Negative for pain and visual disturbance.  Respiratory: Negative for cough and shortness of breath.   Cardiovascular: Positive for leg swelling. Negative for chest pain and palpitations.  Gastrointestinal: Negative for abdominal pain and vomiting.  Genitourinary: Negative for dysuria.  Musculoskeletal: Negative for back pain.  Skin: Negative for rash.  Neurological: Negative for syncope, light-headedness and headaches.  All other systems reviewed and are negative.    Physical Exam Updated Vital Signs BP (!) 148/77   Pulse 64   Temp 98.4 F (36.9 C) (Oral)   Resp 18   SpO2 99%   Physical Exam  Constitutional: She is oriented to person, place, and time. She appears well-developed and well-nourished. No distress.  HENT:  Head: Normocephalic and atraumatic.    Mouth/Throat: Oropharynx is clear and moist.  Eyes: Pupils are equal, round, and reactive to light. Conjunctivae are normal.  Neck: Neck supple.  Cardiovascular: Normal rate, regular rhythm and intact distal pulses.  Pulmonary/Chest: Effort normal and breath sounds normal. No respiratory distress. She has no wheezes. She has no rales.  Abdominal: Soft. She exhibits no distension. There is no tenderness. There is no guarding.  Musculoskeletal: She exhibits edema (2+ edema).  No spinal tenderness.  Extremities atraumatic and nontender.  Mild right lateral chest wall tenderness.  Neurological: She is alert and oriented to person, place, and time. She has normal strength. No sensory deficit. GCS eye subscore is 4. GCS verbal subscore is 5. GCS motor subscore is 6.  Skin: Skin is warm and dry.  Psychiatric: She has a normal mood and affect.  Nursing note and vitals reviewed.    ED Treatments / Results  Labs (all labs ordered are listed, but only abnormal results are displayed) Labs Reviewed - No data to display  EKG   EKG Interpretation None       Radiology Dg Ribs Unilateral  W/chest Right  Result Date: 08/03/2017 CLINICAL DATA:  Right chest wall pain. EXAM: RIGHT RIBS AND CHEST - 3+ VIEW COMPARISON:  Frontal and lateral views of the chest 07/31/2017, additional chest radiographs reviewed FINDINGS: No fracture or other bone lesions are seen involving the ribs. There is no evidence of pneumothorax or pleural effusion. Mild right upper lung scarring. Heart size and mediastinal contours are unchanged with chronic right hilar prominence. Surgical clips in the right axilla. IMPRESSION: Unremarkable radiographic appearance of the right ribs. Electronically Signed   By: Jeb Levering M.D.   On: 08/03/2017 23:11   Ct Head Wo Contrast  Result Date: 08/03/2017 CLINICAL DATA:  [place] and struck head today, denies loss of consciousness, history RIGHT breast cancer, coronary artery disease post NSTEMI and coronary stenting, hypertension EXAM: CT HEAD WITHOUT CONTRAST TECHNIQUE: Contiguous axial images were obtained from the base of the skull through the vertex without intravenous contrast. Sagittal and coronal MPR images reconstructed from axial data set. COMPARISON:  06/10/2015 FINDINGS: Brain: Normal ventricular morphology. No midline shift or mass effect. Normal appearance of brain parenchyma. No intracranial hemorrhage, mass lesion, evidence of acute infarction, or extra-axial fluid collection. Vascular: Unremarkable Skull: Intact Sinuses/Orbits: Mucosal retention cyst in RIGHT maxillary sinus. Remaining visualized paranasal sinuses and mastoid air cells clear Other: N/A IMPRESSION: No acute intracranial abnormalities. Electronically Signed   By: Lavonia Dana M.D.   On: 08/03/2017 16:15    Procedures Procedures (including critical care time)  Medications Ordered in ED Medications  LORazepam (ATIVAN) tablet 0.5 mg (0.5 mg Oral Given During Downtime 08/03/17 2200)     Initial Impression / Assessment and Plan / ED  Course  I have reviewed the triage vital signs and the nursing notes.  Pertinent labs & imaging results that were available during my care of the patient were reviewed by me and considered in my medical decision making (see chart for details).    Patient is a 72 year old female with history as above who presents with a purely mechanical fall.   Patient is on aspirin and Plavix.  Recently admitted for ACS evaluation which led to a heart cath but did not show any obstructive disease.  Patient had no cardiac symptoms today related to the fall.  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  Patient hit her head but did not lose consciousness.  She is fully awake and alert and remembers the event.  She also complains of mild right lateral chest wall pain.  CT head and chest x-ray does not show any acute findings.  Patient at discharge asked about her leg swelling and she does have 2+ pitting edema bilaterally which he states is worse from sitting in the lobby.  Patient currently taking Lasix every other day.  I told her she may take it every day for 3 days and then revert back to every other day.  Patient to follow-up with PCP in the next 2 or 3 days and she has a follow-up appointment with cardiology in 6 days.  Patient discharged in good condition.  Final Clinical Impressions(s) / ED Diagnoses   Final diagnoses:  Fall, initial encounter    ED Discharge Orders    None       Tobie Poet, DO 08/03/17 2330    Margette Fast, MD 08/04/17 [phone]

## 2017-08-03 NOTE — Discharge Instructions (Addendum)
-  Make sure the floors in your house is clear of any obstructions so that you can walk around without tripping.

## 2017-08-03 NOTE — Telephone Encounter (Signed)
Patient states that she fell when she got home from hospital and hit the back of her head.  Patient states that head feels really heavy, patient is on blood-thinner.

## 2017-08-03 NOTE — ED Notes (Signed)
ED Provider at bedside. 

## 2017-08-03 NOTE — Telephone Encounter (Signed)
Spoke with patient who reports she fell on Tuesday (3/19) after leaving the hospital and is concerned b/c she hit her head and had been on heparin.  She reports her head is "swollen and feels funny."  Advised she should have called when the accident occurred and encouraged her to seek further evaluation.  She reports she is presently at the ER getting a CT scan.

## 2017-08-03 NOTE — ED Notes (Signed)
Discharge instructions reviewed with pt. Pt denied further requests/questons.

## 2017-08-04 NOTE — Telephone Encounter (Signed)
No new action at this time.  Candee Furbish, MD

## 2017-08-07 ENCOUNTER — Other Ambulatory Visit: Payer: Medicare Other

## 2017-08-08 ENCOUNTER — Ambulatory Visit
Admission: RE | Admit: 2017-08-08 | Discharge: 2017-08-08 | Disposition: A | Discharge status: Home / Self Care | Payer: Medicare Other | Source: Ambulatory Visit | Attending: Hematology and Oncology | Admitting: Hematology and Oncology

## 2017-08-08 ENCOUNTER — Other Ambulatory Visit: Payer: Self-pay | Admitting: Hematology and Oncology

## 2017-08-08 DIAGNOSIS — N6310 Unspecified lump in the right breast, unspecified quadrant: Secondary | ICD-10-CM

## 2017-08-08 DIAGNOSIS — C50411 Malignant neoplasm of upper-outer quadrant of right female breast: Secondary | ICD-10-CM

## 2017-08-08 DIAGNOSIS — Z170 Estrogen receptor positive status [ER+]: Secondary | ICD-10-CM

## 2017-08-08 DIAGNOSIS — N641 Fat necrosis of breast: Secondary | ICD-10-CM

## 2017-08-08 NOTE — Progress Notes (Signed)
CARDIOLOGY OFFICE NOTE  Date:  08/09/2017    Angela Bond Date of Birth: 01-Jan-1946 Medical Record #[id number]  PCP:  Gaynelle Arabian, MD  Cardiologist:  Marisa Cyphers    Chief Complaint  Patient presents with  . Coronary Artery Disease    Follow up visit - seen for Dr. Marlou Porch    History of Present Illness: Angela Bond is a 72 y.o. female who presents today for a 6 week check. Seen for Dr. Marlou Porch.   She has a complex history - she has CAD s/p non-STEMI with drug-eluting stent to her LAD in 2017 (99% bifurcation lesion in the LAD that was stented and had a jailed diagonal that was dilated), orthostatic hypotension, right breast CA, GERD, HTN and HLD.   She has been intolerant to several medicines. Metoprolol stopped in the past due to dizziness. She has had her ACE stopped as well due to orthostasis.   Last seen by me back in November of 2018 - she was doing ok from our standpoint.   Saw Dr. Marlou Porch last month - felt to be doing ok. Was to come back in 6 months.  She presented to [facility] on 07/31/17 with complaints of chest tightness with associated diaphoresis, dizziness, and shortness of breath while cleaning her house. She had echo and was recathed - see below - to continue with medical management. Beta blocker restarted. She had also been out of her depression medicine - Lexapro which was restarted.   Then back in the ER after last admission - had had a fall - hit her head - had chest wall pain. CT negative. Did have more swelling in her legs - lasix was increased for 3 days.   Comes in today. Here with her husband. She is still having trouble getting her Lexapro in her mail order. She is dizzy. She thinks she was dizzy and tripped which led to the fall last week. Notes she is not "happy go lucky" as she is normally. She is scratching her skin due to her "nerves". No chest pain. Does have swelling - probably getting too much salt.   Past Medical History:    Diagnosis Date  . Anemia    hx  . Anxiety and depression   . Arthritis    "~ all over my body; in the joints"  . Breast cancer, right ([place]) 3/16  . Coronary artery disease    a.  s/p NSTEMI in June 2017 with DES to mid LAD.  Marland Kitchen Depression   . GERD (gastroesophageal reflux disease)   . H/O hiatal hernia   . History of blood transfusion    "when taking chemo & w/one of my knee replacements" (11/05/2015)  . Hypercholesteremia   . Hypertension   . Orthostatic hypotension   . Personal history of chemotherapy   . Personal history of radiation therapy   . Pneumonia X 1  . Shingles 5/15   hx    Past Surgical History:  Procedure Laterality Date  . BREAST BIOPSY Right   . BREAST LUMPECTOMY Right 08/2014  . BREAST LUMPECTOMY WITH SENTINEL LYMPH NODE BIOPSY Right 09/22/2014   Procedure: RIGHT BREAST LUMPECTOMY WITH NEEDLE LOCALIZATION AND RIGHT AXILLARY SENTINEL LYMPH NODE BIOPSY;  Surgeon: [name], MD;  Location: [facility];  Service: General;  Laterality: Right;  . CARDIAC CATHETERIZATION    . CARDIAC CATHETERIZATION N/A 11/05/2015   Procedure: Left Heart Cath and Coronary Angiography;  Surgeon: Belva Crome, MD;  Location: [facility];  Service: Cardiovascular;  Laterality: N/A;  . CARDIAC CATHETERIZATION N/A 11/05/2015   Procedure: Coronary Stent Intervention;  Surgeon: Belva Crome, MD;  Location: [facility];  Service: Cardiovascular;  Laterality: N/A;  . CARPAL TUNNEL RELEASE Right 09/2015  . CORONARY ANGIOPLASTY WITH STENT PLACEMENT  11/05/2015   "1 stent"  . ESOPHAGEAL MANOMETRY N/A 07/02/2012   Procedure: ESOPHAGEAL MANOMETRY (EM);  Surgeon: Lear Ng, MD;  Location: [facility];  Service: Endoscopy;  Laterality: N/A;  schooler to read  . EYE SURGERY Left 2013   tube placed tear duct  . HIATAL HERNIA REPAIR N/A 08/10/2012   Procedure: LAPAROSCOPIC REPAIR OF HIATAL HERNIA WITH MESH AND EGD WITH PEG TUBE PLACEMENT;  Surgeon: Ralene Ok, MD;  Location:  [facility];  Service: General;  Laterality: N/A;  . LEFT HEART CATH AND CORONARY ANGIOGRAPHY N/A 08/01/2017   Procedure: LEFT HEART CATH AND CORONARY ANGIOGRAPHY;  Surgeon: Jettie Booze, MD;  Location: [facility];  Service: Cardiovascular;  Laterality: N/A;  . PORT-A-CATH REMOVAL  06/2015  . PORTACATH PLACEMENT Left 09/22/2014   Procedure: INSERTION PORT-A-CATH;  Surgeon: [name], MD;  Location: [facility];  Service: General;  Laterality: Left;  . POSTERIOR LUMBAR FUSION  2016   "had 2 vertebrae fused"  . TONSILLECTOMY  as child  . TOTAL KNEE ARTHROPLASTY Bilateral 2011,2012     Medications: Current Meds  Medication Sig  . acetaminophen (TYLENOL) 325 MG tablet Take 325 mg by mouth every 6 (six) hours as needed (for pain or headaches).  Marland Kitchen aspirin EC 81 MG EC tablet Take 1 tablet (81 mg total) by mouth daily.  . calcium carbonate (OS-CAL) 600 MG TABS Take 600 mg by mouth 2 (two) times daily with a meal.  . carboxymethylcellulose (REFRESH PLUS) 0.5 % SOLN Place 1 drop into both eyes 4 (four) times daily.   . Cholecalciferol (VITAMIN D) 2000 UNITS tablet Take 2,000 Units by mouth 2 (two) times daily.   . clopidogrel (PLAVIX) 75 MG tablet Take 75 mg by mouth daily.  Marland Kitchen dexlansoprazole (DEXILANT) 60 MG capsule Take 60 mg by mouth daily.  Marland Kitchen escitalopram (LEXAPRO) 20 MG tablet Take 20 mg by mouth daily.  Marland Kitchen ezetimibe (ZETIA) 10 MG tablet Take 10 mg by mouth at bedtime.  . fexofenadine (ALLEGRA) 180 MG tablet Take 180 mg by mouth daily.  . furosemide (LASIX) 20 MG tablet Take 1 tablet (20 mg total) by mouth every other day.  . gabapentin (NEURONTIN) 300 MG capsule Take 300 mg by mouth 3 (three) times daily.  Marland Kitchen LORazepam (ATIVAN) 1 MG tablet Take 0.5-1 mg by mouth 2 (two) times daily as needed for anxiety or sleep.   Marland Kitchen lovastatin (MEVACOR) 40 MG tablet Take 40 mg by mouth at bedtime.  . meclizine (ANTIVERT) 25 MG tablet Take 12.5-25 mg by mouth 3 (three) times daily as needed for  dizziness.  . nitroGLYCERIN (NITROSTAT) 0.4 MG SL tablet Place 1 tablet (0.4 mg total) under the tongue every 5 (five) minutes as needed for chest pain (CP or SOB).  . ranitidine (ZANTAC) 150 MG tablet Take 150 mg by mouth at bedtime.  . sodium chloride (OCEAN) 0.65 % SOLN nasal spray Place 2 sprays into both nostrils as needed for congestion.  . [DISCONTINUED] metoprolol tartrate (LOPRESSOR) 25 MG tablet Take 0.5 tablets (12.5 mg total) by mouth 2 (two) times daily.     Allergies: Allergies  Allergen Reactions  . Shellfish  Allergy Anaphylaxis    ANAPHYLAXIS TO OYSTERS  . Lactose Intolerance (Gi) Nausea And Vomiting  . Brilinta [Ticagrelor] Other (See Comments)    Indigestion  . Penicillins Nausea And Vomiting    Has patient had a PCN reaction causing immediate rash, facial/tongue/throat swelling, SOB or lightheadedness with hypotension: Yes Has patient had a PCN reaction causing severe rash involving mucus membranes or skin necrosis: No Has patient had a PCN reaction that required hospitalization: No Has patient had a PCN reaction occurring within the last 10 years: No If all of the above answers are "NO", then may proceed with Cephalosporin use.   [name] [Naproxen] Nausea And Vomiting  . Aspirin Other (See Comments)    Burns stomach  . Ciprofloxacin Nausea And Vomiting  . Doxycycline Nausea And Vomiting  . Evista [Raloxifene] Other (See Comments)    LEG ACHES  . Fosamax [Alendronate Sodium] Other (See Comments)    Leg aches  . Latex Rash  . Naproxen Sodium Nausea And Vomiting  . Nitrofurantoin Rash    "I broke out down there"  . Pravachol [Pravastatin] Other (See Comments)    GI upset   . Sulfa Antibiotics Nausea And Vomiting  . Tape Rash  . Welchol [Colesevelam Hcl] Other (See Comments)    Stomach cramps  . Zocor [Simvastatin] Rash    Social History: The patient  reports that she has never smoked. She has never used smokeless tobacco. She reports that she does not  drink alcohol or use drugs.   Family History: The patient's family history includes Cancer in her father and sister; Diabetes in her father and sister; Heart disease in her father, mother, and sister.   Review of Systems: Please see the history of present illness.   Otherwise, the review of systems is positive for none.   All other systems are reviewed and negative.   Physical Exam: VS:  BP 130/80   Pulse 61   Ht 5' (1.524 m)   Wt 185 lb 12.8 oz (84.3 kg)   SpO2 99%   BMI 36.29 kg/m  .  BMI Body mass index is 36.29 kg/m.  Wt Readings from Last 3 Encounters:  08/09/17 185 lb 12.8 oz (84.3 kg)  08/01/17 184 lb 4.9 oz (83.6 kg)  06/22/17 180 lb 3.2 oz (81.7 kg)    General: Pleasant. Elderly. Obese. Alert and in no acute distress.   HEENT: Normal.  Neck: Supple, no JVD, carotid bruits, or masses noted.  Cardiac: Regular rate and rhythm. No murmurs, rubs, or gallops. Legs are full/fleshy with some edema.  Respiratory:  Lungs are clear to auscultation bilaterally with normal work of breathing.  GI: Soft and nontender.  MS: No deformity or atrophy. Gait and ROM intact.  Skin: Warm and dry. Color is normal. She has lots of small ulcerations from scratching noted on all extremities.  Neuro:  Strength and sensation are intact and no gross focal deficits noted.  Psych: Alert, appropriate and with normal affect.   LABORATORY DATA:  EKG:  EKG is not ordered today.  Lab Results  Component Value Date   WBC 6.4 08/01/2017   HGB 13.4 08/01/2017   HCT 41.5 08/01/2017   PLT 199 08/01/2017   GLUCOSE 165 (H) 08/01/2017   CHOL 153 08/01/2017   TRIG 63 08/01/2017   HDL 59 08/01/2017   LDLCALC 81 08/01/2017   ALT 11 03/21/2017   AST 18 03/21/2017   NA 139 08/01/2017   K 4.2 08/01/2017   CL  101 08/01/2017   CREATININE 0.89 08/01/2017   BUN 6 08/01/2017   CO2 25 08/01/2017   TSH 1.640 09/20/2013   INR 1.05 08/01/2017   HGBA1C 5.5 11/05/2015     BNP (last 3 results) Recent Labs     07/31/17 1651  BNP 47.8    ProBNP (last 3 results) Recent Labs    03/21/17 0935  PROBNP 177     Other Studies Reviewed Today:  Cardiac catheterization 07/31/17:   Mid RCA lesion is 25% stenosed.  Previously placed Prox LAD stent, DES,is widely patent.  Ost 1st Diag lesion is 75% stenosed. The ostium is jailed by the stent. THis is a small vessel.  Mid LAD lesion is 25% stenosed.  The left ventricular systolic function is normal.  LV end diastolic pressure is mildly elevated.  The left ventricular ejection fraction is 55-65% by visual estimate.  There is no aortic valve stenosis.  Continue medical therapy. I encouraged her to be more active to try to prevent deconditioning.    Echo Study Conclusions 07/2017  - Left ventricle: The cavity size was normal. Systolic function was   normal. The estimated ejection fraction was in the range of 55%   to 60%. Wall motion was normal; there were no regional wall   motion abnormalities. Left ventricular diastolic function   parameters were normal. - Pulmonary arteries: PA peak pressure: 32 mm Hg (S).  NUC stress 01/16/16: 1. Septal scar/infarction. No findings for ischemia.  2. Septal wall motion abnormality and bulging of the inferoseptal wall with contraction.  3. Left ventricular ejection fraction 68%  4. Non invasive risk stratification*: Intermediate to low risk.  Assessment & Plan:  1. Coronary artery disease with prior MI - June 2017 with NSTEMI and DES to the mid LAD  Recent admission for chest pain - stable cath findings. Stable echo. She was started back on beta blocker which she has not tolerated in the past - stopping today. Remains on DAPT.   2. Recent Fall - she says combination of being dizzy and tripping - some hoarding behaviors noted in the record - stopping beta blocker today - hopefully this will help.   3. Orthostatic hypotension - she has had to have her ACE stopped in the past. She  has not done well with beta blocker - stopping this again today. Need to tolerate a higher BP. BP is fine here today.   4. Hyperlipidemia - on statin therapy  5. Remote postoperative atrial fibrillation in 2014 during PEG tube placement, EGD -No further episodes.  No anticoagulation warranted at that time.  Hopefully this will not recur.  6. Depression - waiting on her Lexapro - she will restart soon - I think this will improve her situation as well - she has been off for about a month - did not taper just stopped abruptly due to issues with refill.   7. Lower extremity edema - would cautiously use extra diuretic - see above. Encouraged her to continue to elevate her legs, restrict salt, etc.    Current medicines are reviewed with the patient today.  The patient does not have concerns regarding medicines other than what has been noted above.  The following changes have been made:  See above.  Labs/ tests ordered today include:   No orders of the defined types were placed in this encounter.    Disposition:   FU with me in 3 months.    Patient is agreeable to this plan and will  call if any problems develop in the interim.   SignedTruitt Merle, NP  08/09/2017 10:01 AM  [facility] [address] [place], [place]  [address] Phone: [phone] Fax: [phone]

## 2017-08-09 ENCOUNTER — Ambulatory Visit: Payer: Medicare Other | Admitting: Nurse Practitioner

## 2017-08-09 ENCOUNTER — Encounter: Payer: Self-pay | Admitting: Nurse Practitioner

## 2017-08-09 VITALS — BP 130/80 | HR 61 | Ht 60.0 in | Wt 185.8 lb

## 2017-08-09 DIAGNOSIS — I951 Orthostatic hypotension: Secondary | ICD-10-CM

## 2017-08-09 DIAGNOSIS — R609 Edema, unspecified: Secondary | ICD-10-CM

## 2017-08-09 DIAGNOSIS — I10 Essential (primary) hypertension: Secondary | ICD-10-CM | POA: Diagnosis not present

## 2017-08-09 DIAGNOSIS — I2510 Atherosclerotic heart disease of native coronary artery without angina pectoris: Secondary | ICD-10-CM | POA: Diagnosis not present

## 2017-08-09 NOTE — Patient Instructions (Addendum)
We will be checking the following labs today - NONE   Medication Instructions:    Continue with your current medicines. BUT  STOP Metoprolol    Testing/Procedures To Be Arranged:  N/A  Follow-Up:   See me in 3 months    Other Special Instructions:   N/A    If you need a refill on your cardiac medications before your next appointment, please call your pharmacy.   Call the [place] office at [phone] if you have any questions, problems or concerns.

## 2017-08-21 ENCOUNTER — Ambulatory Visit
Admission: RE | Admit: 2017-08-21 | Discharge: 2017-08-21 | Disposition: A | Discharge status: Home / Self Care | Payer: Medicare Other | Source: Ambulatory Visit | Attending: Family Medicine | Admitting: Family Medicine

## 2017-08-21 ENCOUNTER — Other Ambulatory Visit: Payer: Self-pay | Admitting: Family Medicine

## 2017-08-21 DIAGNOSIS — R05 Cough: Secondary | ICD-10-CM

## 2017-08-21 DIAGNOSIS — R059 Cough, unspecified: Secondary | ICD-10-CM

## 2017-09-13 ENCOUNTER — Other Ambulatory Visit: Payer: Self-pay | Admitting: Cardiology

## 2017-10-05 ENCOUNTER — Ambulatory Visit: Payer: Medicare Other | Admitting: Podiatry

## 2017-10-05 ENCOUNTER — Ambulatory Visit (INDEPENDENT_AMBULATORY_CARE_PROVIDER_SITE_OTHER): Payer: Medicare Other

## 2017-10-05 ENCOUNTER — Encounter: Payer: Self-pay | Admitting: Podiatry

## 2017-10-05 DIAGNOSIS — M779 Enthesopathy, unspecified: Secondary | ICD-10-CM

## 2017-10-05 DIAGNOSIS — S9031XA Contusion of right foot, initial encounter: Secondary | ICD-10-CM | POA: Diagnosis not present

## 2017-10-05 DIAGNOSIS — M7751 Other enthesopathy of right foot: Secondary | ICD-10-CM

## 2017-10-05 DIAGNOSIS — M7752 Other enthesopathy of left foot: Secondary | ICD-10-CM

## 2017-10-05 MED ORDER — GABAPENTIN 600 MG PO TABS: 600.0000 mg | ORAL_TABLET | Freq: Three times a day (TID) | ORAL | 3 refills | Status: DC

## 2017-10-05 NOTE — Progress Notes (Signed)
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  States that both of them have been stinging and burning rarely but the gabapentin 300 mg helps considerably for a long time but no longer has been.  She also has pain to the hallux right because of falling in the driveway.  She states that she hurt nothing other than her hallux.  Her graph objective: Vital signs are stable she is alert and oriented x3.  No erythema some mild edema to the forefoot bilaterally.  Mild ecchymosis to the base of the proximal phalanx hallux right.  No other changes in her past medical history medications or allergies.  Assessment: Neuropathy bilateral.  Contusion toe right hallux she also has capsulitis and pain to the dorsal aspect of bilateral foot.  Plan: At this point I injected 20 mg of Kenalog 5 mg Marcaine bilateral dorsal aspect of the foot after sterile Betadine skin prep.  Tolerated procedure well without complications.  Increase her gabapentin to 600 mg 3 times a day.  She will follow-up with me in 1 month to for reevaluation.

## 2017-11-02 ENCOUNTER — Telehealth: Payer: Self-pay | Admitting: *Deleted

## 2017-11-02 MED ORDER — GABAPENTIN 600 MG PO TABS: 600.0000 mg | ORAL_TABLET | Freq: Three times a day (TID) | ORAL | 3 refills | Status: DC

## 2017-11-02 NOTE — Telephone Encounter (Signed)
Request for Gabapentin 600mg . Dr. Milinda Pointer states refill as previously. Escribed to [facility].

## 2017-11-03 ENCOUNTER — Other Ambulatory Visit: Payer: Self-pay | Admitting: *Deleted

## 2017-11-03 MED ORDER — GABAPENTIN 600 MG PO TABS: 600.0000 mg | ORAL_TABLET | Freq: Three times a day (TID) | ORAL | 3 refills | Status: DC

## 2017-11-07 ENCOUNTER — Ambulatory Visit (INDEPENDENT_AMBULATORY_CARE_PROVIDER_SITE_OTHER): Payer: Medicare Other | Admitting: Podiatry

## 2017-11-07 ENCOUNTER — Encounter: Payer: Self-pay | Admitting: Podiatry

## 2017-11-07 DIAGNOSIS — M778 Other enthesopathies, not elsewhere classified: Secondary | ICD-10-CM

## 2017-11-07 DIAGNOSIS — M779 Enthesopathy, unspecified: Secondary | ICD-10-CM

## 2017-11-07 DIAGNOSIS — M7752 Other enthesopathy of left foot: Secondary | ICD-10-CM

## 2017-11-08 ENCOUNTER — Encounter: Payer: Self-pay | Admitting: Nurse Practitioner

## 2017-11-08 ENCOUNTER — Ambulatory Visit: Payer: Medicare Other | Admitting: Nurse Practitioner

## 2017-11-08 VITALS — BP 98/78 | HR 74 | Ht 60.0 in | Wt 183.8 lb

## 2017-11-08 DIAGNOSIS — I255 Ischemic cardiomyopathy: Secondary | ICD-10-CM

## 2017-11-08 DIAGNOSIS — I10 Essential (primary) hypertension: Secondary | ICD-10-CM

## 2017-11-08 NOTE — Progress Notes (Signed)
She presents today complaining of painful joints as she points to the subtalar joint of the left foot the fourth toe of the left foot denying any trauma to that stating that it was more swollen than what it is at this point but feels much improved.  She is also complaining of pain to the right first metatarsophalangeal joint.  Again denies any trauma.  Denies any other joint pain.  Objective: Vital signs are stable alert and oriented x3.  Pulses are palpable.  Neurologic sensorium is intact deep tendon reflexes are intact muscle strength is normal symmetrical bilateral.  Orthopedic evaluation demonstrates all joints distal ankle full range of motion without crepitation.  Has pain on range of motion of the first metatarsophalangeal joint of the right foot without crepitation.  Adductovarus rotated hammertoe deformity fourth left with mild erythema and nodularity over the DIPJ as in a Heberden's node.  She also has pain on end range of motion of the subtalar joint ankle joint appears to be normal without crepitation.  Assessment: Pain in limb secondary to capsulitis of the first metatarsophalangeal joint of the right foot DIPJ fourth digit left foot and subtalar joint the left foot.  Plan: Discussed etiology pathology conservative versus surgical therapies discussed appropriate shoe gear stretching exercise ice therapy sugar modifications.  At this point after sterile Betadine skin prep to all sites I injected 2 mg of dexamethasone and local anesthetic to the first metatarsophalangeal joint of the right foot to the DIPJ of the fourth digit left foot and 20 mill grams Kenalog and 5 mg Marcaine to the point of maximal tenderness or subtalar joint of the left foot.  She tolerated procedure well without complications follow-up with her in 1 month.

## 2017-11-08 NOTE — Patient Instructions (Addendum)
We will be checking the following labs today - CBC and BMET   Medication Instructions:    Continue with your current medicines.     Testing/Procedures To Be Arranged:  N/A  Follow-Up:   See me in 4 months    Other Special Instructions:   N/A    If you need a refill on your cardiac medications before your next appointment, please call your pharmacy.   Call the [place] office at [phone] if you have any questions, problems or concerns.

## 2017-11-08 NOTE — Progress Notes (Addendum)
CARDIOLOGY OFFICE NOTE  Date:  11/08/2017    [name] Date of Birth: 26-Sep-1945 Medical Record #[id number]  PCP:  Gaynelle Arabian, MD  Cardiologist:  Marisa Cyphers    Chief Complaint  Patient presents with  . Coronary Artery Disease    3 month check - seen for Dr. Marlou Porch    History of Present Illness: Angela Bond is a 72 y.o. female who presents today for a 3 month check. Seen for Dr. Marlou Porch.   She has a complex history - she has CAD s/pnon-STEMI with drug-eluting stent to her LAD in 2017(99% bifurcation lesion in the LAD that was stented and had a jailed diagonal that was dilated), orthostatic hypotension, right breast CA, GERD, HTN and HLD.   She has been intolerant to several medicines. Metoprolol stopped in the past due to dizziness. She has had her ACE stopped as well due to orthostasis.   Saw Dr. Marlou Porch last in February and was felt to be doing ok. I then saw her in March after an ER visit for chest pain - she had echo and was recathed - to continue with medical management. She then went back to the ER after having a fall - apparently some hoarding behaviors noted. At the time of the last visit - she was doing ok.   Comes in today. Here alone today. Husband went fishing. She does not feel well. Her head hurts. Her head hurts over the left eye/forehead. She is more short of breath but says overall she is ok. She has been lightheaded at times - no falls. BP soft.  Her headache seems to be the more pressing issue - she is wondering if it is allergies/sinuses. She has not reached out to primary care. She is down a few pounds. Wanting to lose weight.   Past Medical History:  Diagnosis Date  . Anemia    hx  . Anxiety and depression   . Aortic atherosclerosis ([place]) 07/31/2017  . Arthritis    "~ all over my body; in the joints"  . Breast cancer, right ([place]) 3/16  . Coronary artery disease    a.  s/p NSTEMI in June 2017 with DES to mid LAD.  Marland Kitchen Depression    . GERD (gastroesophageal reflux disease)   . H/O hiatal hernia   . History of blood transfusion    "when taking chemo & w/one of my knee replacements" (11/05/2015)  . Hypercholesteremia   . Hypertension   . Iron deficiency anemia 02/16/2017  . Orthostatic hypotension   . Personal history of chemotherapy   . Personal history of radiation therapy   . Pneumonia X 1  . Shingles 5/15   hx  . Sliding hiatal hernia s/p lap PEH repair 07/31/2012  . Unstable angina ([place]) 01/23/2012    Past Surgical History:  Procedure Laterality Date  . BREAST BIOPSY Right   . BREAST LUMPECTOMY Right 08/2014  . BREAST LUMPECTOMY WITH SENTINEL LYMPH NODE BIOPSY Right 09/22/2014   Procedure: RIGHT BREAST LUMPECTOMY WITH NEEDLE LOCALIZATION AND RIGHT AXILLARY SENTINEL LYMPH NODE BIOPSY;  Surgeon: [name], MD;  Location: [facility];  Service: General;  Laterality: Right;  . CARDIAC CATHETERIZATION    . CARDIAC CATHETERIZATION N/A 11/05/2015   Procedure: Left Heart Cath and Coronary Angiography;  Surgeon: Belva Crome, MD;  Location: [facility];  Service: Cardiovascular;  Laterality: N/A;  . CARDIAC CATHETERIZATION N/A 11/05/2015   Procedure: Coronary Stent Intervention;  Surgeon:  Belva Crome, MD;  Location: [facility];  Service: Cardiovascular;  Laterality: N/A;  . CARPAL TUNNEL RELEASE Right 09/2015  . CORONARY ANGIOPLASTY WITH STENT PLACEMENT  11/05/2015   "1 stent"  . ESOPHAGEAL MANOMETRY N/A 07/02/2012   Procedure: ESOPHAGEAL MANOMETRY (EM);  Surgeon: Lear Ng, MD;  Location: [facility];  Service: Endoscopy;  Laterality: N/A;  schooler to read  . EYE SURGERY Left 2013   tube placed tear duct  . HIATAL HERNIA REPAIR N/A 08/10/2012   Procedure: LAPAROSCOPIC REPAIR OF HIATAL HERNIA WITH MESH AND EGD WITH PEG TUBE PLACEMENT;  Surgeon: Ralene Ok, MD;  Location: [facility];  Service: General;  Laterality: N/A;  . LEFT HEART CATH AND CORONARY ANGIOGRAPHY N/A 08/01/2017   Procedure: LEFT  HEART CATH AND CORONARY ANGIOGRAPHY;  Surgeon: Jettie Booze, MD;  Location: [facility];  Service: Cardiovascular;  Laterality: N/A;  . PORT-A-CATH REMOVAL  06/2015  . PORTACATH PLACEMENT Left 09/22/2014   Procedure: INSERTION PORT-A-CATH;  Surgeon: [name], MD;  Location: [facility];  Service: General;  Laterality: Left;  . POSTERIOR LUMBAR FUSION  2016   "had 2 vertebrae fused"  . TONSILLECTOMY  as child  . TOTAL KNEE ARTHROPLASTY Bilateral 2011,2012     Medications: Current Meds  Medication Sig  . acetaminophen (TYLENOL) 325 MG tablet Take 325 mg by mouth every 6 (six) hours as needed (for pain or headaches).  . Ascorbic Acid (VITAMIN C) 100 MG tablet Take 100 mg by mouth daily.  Marland Kitchen aspirin EC 81 MG EC tablet Take 1 tablet (81 mg total) by mouth daily.  . calcium carbonate (OS-CAL) 600 MG TABS Take 600 mg by mouth 2 (two) times daily with a meal.  . carboxymethylcellulose (REFRESH PLUS) 0.5 % SOLN Place 1 drop into both eyes 4 (four) times daily.   . Cholecalciferol (VITAMIN D) 2000 UNITS tablet Take 2,000 Units by mouth 2 (two) times daily.   . clopidogrel (PLAVIX) 75 MG tablet TAKE 1 TABLET BY MOUTH  DAILY  . dexlansoprazole (DEXILANT) 60 MG capsule Take 60 mg by mouth daily.  Marland Kitchen escitalopram (LEXAPRO) 20 MG tablet Take 20 mg by mouth daily.  Marland Kitchen ezetimibe (ZETIA) 10 MG tablet Take 10 mg by mouth at bedtime.  . fexofenadine (ALLEGRA) 180 MG tablet Take 180 mg by mouth daily.  . furosemide (LASIX) 20 MG tablet Take 1 tablet (20 mg total) by mouth every other day.  . gabapentin (NEURONTIN) 600 MG tablet Take 1 tablet (600 mg total) by mouth 3 (three) times daily.  Marland Kitchen LORazepam (ATIVAN) 1 MG tablet Take 0.5-1 mg by mouth 2 (two) times daily as needed for anxiety or sleep.   Marland Kitchen lovastatin (MEVACOR) 40 MG tablet Take 40 mg by mouth at bedtime.  . meclizine (ANTIVERT) 25 MG tablet Take 12.5-25 mg by mouth 3 (three) times daily as needed for dizziness.  . nitroGLYCERIN (NITROSTAT)  0.4 MG SL tablet Place 1 tablet (0.4 mg total) under the tongue every 5 (five) minutes as needed for chest pain (CP or SOB).  . ranitidine (ZANTAC) 150 MG tablet Take 150 mg by mouth at bedtime.  . sodium chloride (OCEAN) 0.65 % SOLN nasal spray Place 2 sprays into both nostrils as needed for congestion.     Allergies: Allergies  Allergen Reactions  . Shellfish Allergy Anaphylaxis    ANAPHYLAXIS TO OYSTERS  . Lactose Intolerance (Gi) Nausea And Vomiting  . Brilinta [Ticagrelor] Other (See Comments)    Indigestion  .  Penicillins Nausea And Vomiting    Has patient had a PCN reaction causing immediate rash, facial/tongue/throat swelling, SOB or lightheadedness with hypotension: Yes Has patient had a PCN reaction causing severe rash involving mucus membranes or skin necrosis: No Has patient had a PCN reaction that required hospitalization: No Has patient had a PCN reaction occurring within the last 10 years: No If all of the above answers are "NO", then may proceed with Cephalosporin use.   [name] [Naproxen] Nausea And Vomiting  . Aspirin Other (See Comments)    Burns stomach  . Ciprofloxacin Nausea And Vomiting  . Doxycycline Nausea And Vomiting  . Evista [Raloxifene] Other (See Comments)    LEG ACHES  . Fosamax [Alendronate Sodium] Other (See Comments)    Leg aches  . Latex Rash  . Naproxen Sodium Nausea And Vomiting  . Nitrofurantoin Rash    "I broke out down there"  . Pravachol [Pravastatin] Other (See Comments)    GI upset   . Sulfa Antibiotics Nausea And Vomiting  . Tape Rash  . Welchol [Colesevelam Hcl] Other (See Comments)    Stomach cramps  . Zocor [Simvastatin] Rash    Social History: The patient  reports that she has never smoked. She has never used smokeless tobacco. She reports that she does not drink alcohol or use drugs.   Family History: The patient's family history includes Cancer in her father and sister; Diabetes in her father and sister; Heart disease  in her father, mother, and sister.   Review of Systems: Please see the history of present illness.   Otherwise, the review of systems is positive for none.   All other systems are reviewed and negative.   Physical Exam: VS:  BP 98/78 (BP Location: Left Arm, Patient Position: Sitting, Cuff Size: Large)   Pulse 74   Ht 5' (1.524 m)   Wt 183 lb 12.8 oz (83.4 kg)   SpO2 91% Comment: walking in/walked 96  BMI 35.90 kg/m  .  BMI Body mass index is 35.9 kg/m.  Wt Readings from Last 3 Encounters:  11/08/17 183 lb 12.8 oz (83.4 kg)  08/09/17 185 lb 12.8 oz (84.3 kg)  08/01/17 184 lb 4.9 oz (83.6 kg)    General: Pleasant. Well developed, well nourished and in no acute distress. She actually looks better today.   HEENT: Normal.  Neck: Supple, no JVD, carotid bruits, or masses noted.  Cardiac: Regular rate and rhythm. No murmurs, rubs, or gallops. No edema.  Respiratory:  Lungs are clear to auscultation bilaterally with normal work of breathing.  GI: Soft and nontender.  MS: No deformity or atrophy. Gait and ROM intact.  Skin: Warm and dry. Color is normal.  Neuro:  Strength and sensation are intact and no gross focal deficits noted.  Psych: Alert, appropriate and with normal affect.   LABORATORY DATA:  EKG:  EKG is not ordered today.  Lab Results  Component Value Date   WBC 6.4 08/01/2017   HGB 13.4 08/01/2017   HCT 41.5 08/01/2017   PLT 199 08/01/2017   GLUCOSE 165 (H) 08/01/2017   CHOL 153 08/01/2017   TRIG 63 08/01/2017   HDL 59 08/01/2017   LDLCALC 81 08/01/2017   ALT 11 03/21/2017   AST 18 03/21/2017   NA 139 08/01/2017   K 4.2 08/01/2017   CL 101 08/01/2017   CREATININE 0.89 08/01/2017   BUN 6 08/01/2017   CO2 25 08/01/2017   TSH 1.640 09/20/2013   INR 1.05  08/01/2017   HGBA1C 5.5 11/05/2015     BNP (last 3 results) Recent Labs    07/31/17 1651  BNP 47.8    ProBNP (last 3 results) Recent Labs    03/21/17 0935  PROBNP 177     Other Studies  Reviewed Today:  Cardiac catheterization 07/31/17:   Mid RCA lesion is 25% stenosed.  Previously placed Prox LAD stent, DES,is widely patent.  Ost 1st Diag lesion is 75% stenosed. The ostium is jailed by the stent. THis is a small vessel.  Mid LAD lesion is 25% stenosed.  The left ventricular systolic function is normal.  LV end diastolic pressure is mildly elevated.  The left ventricular ejection fraction is 55-65% by visual estimate.  There is no aortic valve stenosis.  Continue medical therapy. I encouraged her to be more active to try to prevent deconditioning.    Echo Study Conclusions 07/2017  - Left ventricle: The cavity size was normal. Systolic function was normal. The estimated ejection fraction was in the range of 55% to 60%. Wall motion was normal; there were no regional wall motion abnormalities. Left ventricular diastolic function parameters were normal. - Pulmonary arteries: PA peak pressure: 32 mm Hg (S).  NUC stress 01/16/16: 1. Septal scar/infarction. No findings for ischemia.  2. Septal wall motion abnormality and bulging of the inferoseptal wall with contraction.  3. Left ventricular ejection fraction 68%  4. Non invasive risk stratification*: Intermediate to low risk.    Assessment & Plan:  1. Coronary artery disease with prior MI June 2017 with NSTEMI and DES to the mid LAD - she was recathed this past March for chest pain - stable findings and stable echo - she has not tolerated beta blocker in the past - she has not been able to be on ACE due to prior othostatic hypotension. She is basically just on Plavix. No chest pain noted.   2. Prior fall - has not recurred.   3. HLD - on statin  4. Orthostasis - no syncope. Really not on anything that I can stop.   5. Remote postoperative atrial fibrillation in 2014during PEG tube placement, EGD -No further episodes. No anticoagulation warranted at that time. Hopefully this  will not recur.  6. Depression - she is back on Lexapro  7. Lower extremity edema - would cautiously use extra diuretic - see above. Encouraged her to continue to elevate her legs, restrict salt, etc.   8. Obesity - she is planning on continuing to try and lose weight.   9. Headache - she will contact her PCP. We are going to check some lab today.    Current medicines are reviewed with the patient today.  The patient does not have concerns regarding medicines other than what has been noted above.  The following changes have been made:  See above.  Labs/ tests ordered today include:   No orders of the defined types were placed in this encounter.    Disposition:   FU with me in about 4 months. I think she looks better today. No changes made..    Patient is agreeable to this plan and will call if any problems develop in the interim.   SignedTruitt Merle, NP  11/08/2017 1:55 PM  [facility] [address] [place], [place]  [address] Phone: [phone] Fax: [phone]

## 2017-11-09 LAB — CBC
Hematocrit: 34.5 % (ref 34.0–46.6)
Hemoglobin: 11.2 g/dL (ref 11.1–15.9)
MCH: 28.2 pg (ref 26.6–33.0)
MCHC: 32.5 g/dL (ref 31.5–35.7)
MCV: 87 fL (ref 79–97)
Platelets: 234 10*3/uL (ref 150–450)
RBC: 3.97 x10E6/uL (ref 3.77–5.28)
RDW: 13.9 % (ref 12.3–15.4)
WBC: 8.7 10*3/uL (ref 3.4–10.8)

## 2017-11-09 LAB — BASIC METABOLIC PANEL
BUN/Creatinine Ratio: 19 (ref 12–28)
BUN: 15 mg/dL (ref 8–27)
CO2: 21 mmol/L (ref 20–29)
Calcium: 9 mg/dL (ref 8.7–10.3)
Chloride: 104 mmol/L (ref 96–106)
Creatinine, Ser: 0.8 mg/dL (ref 0.57–1.00)
GFR calc Af Amer: 85 mL/min/{1.73_m2} (ref >59)
GFR calc non Af Amer: 74 mL/min/{1.73_m2} (ref >59)
Glucose: 80 mg/dL (ref 65–99)
Potassium: 3.9 mmol/L (ref 3.5–5.2)
Sodium: 144 mmol/L (ref 134–144)

## 2017-11-14 ENCOUNTER — Inpatient Hospital Stay: Payer: Medicare Other | Attending: Hematology and Oncology

## 2017-11-14 DIAGNOSIS — Z853 Personal history of malignant neoplasm of breast: Secondary | ICD-10-CM | POA: Insufficient documentation

## 2017-11-14 DIAGNOSIS — D508 Other iron deficiency anemias: Secondary | ICD-10-CM

## 2017-11-14 DIAGNOSIS — D509 Iron deficiency anemia, unspecified: Secondary | ICD-10-CM | POA: Insufficient documentation

## 2017-11-14 LAB — CBC (CANCER CENTER ONLY)
HEMATOCRIT: 35.7 % (ref 34.8–46.6)
HEMOGLOBIN: 11.2 g/dL — AB (ref 11.6–15.9)
MCH: 28.5 pg (ref 25.1–34.0)
MCHC: 31.4 g/dL — ABNORMAL LOW (ref 31.5–36.0)
MCV: 90.8 fL (ref 79.5–101.0)
Platelet Count: 208 10*3/uL (ref 145–400)
RBC: 3.93 MIL/uL (ref 3.70–5.45)
RDW: 13.7 % (ref 11.2–14.5)
WBC: 6.1 10*3/uL (ref 3.9–10.3)

## 2017-11-14 LAB — IRON AND TIBC
Iron: 44 ug/dL (ref 41–142)
Saturation Ratios: 11 % — ABNORMAL LOW (ref 21–57)
TIBC: 384 ug/dL (ref 236–444)
UIBC: 340 ug/dL

## 2017-11-14 LAB — FERRITIN: Ferritin: 27 ng/mL (ref 11–307)

## 2017-11-15 ENCOUNTER — Telehealth: Payer: Self-pay | Admitting: Hematology and Oncology

## 2017-11-15 ENCOUNTER — Inpatient Hospital Stay (HOSPITAL_BASED_OUTPATIENT_CLINIC_OR_DEPARTMENT_OTHER): Payer: Medicare Other | Admitting: Hematology and Oncology

## 2017-11-15 DIAGNOSIS — Z853 Personal history of malignant neoplasm of breast: Secondary | ICD-10-CM

## 2017-11-15 DIAGNOSIS — D509 Iron deficiency anemia, unspecified: Secondary | ICD-10-CM

## 2017-11-15 DIAGNOSIS — C50411 Malignant neoplasm of upper-outer quadrant of right female breast: Secondary | ICD-10-CM

## 2017-11-15 DIAGNOSIS — Z171 Estrogen receptor negative status [ER-]: Secondary | ICD-10-CM

## 2017-11-15 DIAGNOSIS — D508 Other iron deficiency anemias: Secondary | ICD-10-CM

## 2017-11-15 NOTE — Progress Notes (Signed)
Patient Care Team: Gaynelle Arabian, MD as PCP - General (Family Medicine) Jerline Pain, MD as PCP - Cardiology (Cardiology) Jerline Pain, MD as Consulting Physician (Cardiology) Wonda Horner, MD as Consulting Physician (Gastroenterology) Ralene Ok, MD as Consulting Physician (Surgery) Nicholas Lose, MD as Consulting Physician (Hematology and Oncology) Sylvan Cheese, NP as Nurse Practitioner (Hematology and Oncology) Jovita Kussmaul, MD as Consulting Physician (General Surgery) Thea Silversmith, MD as Consulting Physician (Radiation Oncology)  DIAGNOSIS:  Encounter Diagnoses  Name Primary?  . Malignant neoplasm of upper-outer quadrant of right breast in female, estrogen receptor negative ([place])   . Iron deficiency anemia secondary to inadequate dietary iron intake     SUMMARY OF ONCOLOGIC HISTORY:   Breast cancer of upper-outer quadrant of right female breast ([place])   07/30/2014 Mammogram    Right breast: Mass at 10 o'clock location 4 cm from the nipple measuring 1.7 x 1.3 x 1.3 cm. This corresponds to the palpable and mammographic finding. In the right axilla, there are several lymph nodes which are normal in size measuring up to 19m.      08/27/2014 Initial Biopsy    Right breast core needle bx: Invasive ductal carcinoma with LVI, grade 3, ER- (0%), PR- (0%), HER2/neu negative, Ki67 30%      08/27/2014 Clinical Stage    Stage IIA: T2 N0      09/22/2014 Definitive Surgery    Right lumpectomy/SLNB (Marlou Starks: Invasive ductal carcinoma, grade 3, 2.6 cm, high grade DCIS, LVI present, ER-, PR-, 2 LN removed and 1 positive for metastatic carcinoma (1/2)      09/22/2014 Pathologic Stage    Stage IIB: T2 N1 M0      10/15/2014 - 12/16/2014 Chemotherapy    Adjuvant chemotherapy with Taxotere Cytoxan 4 q3 weeks      10/21/2014 - 10/24/2014 Hospital Admission    Admission for neutropenic fever UTI Klebsiella      01/26/2015 - 03/11/2015 Radiation Therapy    Adjuvant RT  (Pablo Ledger: Right breast / 45 Gray @ 1.8 GPearline Cablesper fraction x 25 fractions Right supraclavicular fossa/PAB 45 Gy '@1' .8 Gy per fraction x 25 fractions Right breast boost / 16 Gray at 2[employer] Corporationper fraction x 8 fractions      05/19/2015 Survivorship    Survivorship care plan visit completed.       CHIEF COMPLIANT: Follow-up of breast cancer and iron deficiency anemia  INTERVAL HISTORY: Angela WALTHOURis a 72year old with above-mentioned history of right breast cancer treated with lumpectomy and adjuvant chemotherapy and radiation and is currently in surveillance.  She was diagnosed with iron deficiency anemia and is here for blood work and follow-up.  She has profound fatigue and is not able to carry on with her day-to-day activities.  REVIEW OF SYSTEMS:   Constitutional: Denies fevers, chills or abnormal weight loss Eyes: Denies blurriness of vision Ears, nose, mouth, throat, and face: Denies mucositis or sore throat Respiratory: Denies cough, dyspnea or wheezes Cardiovascular: Denies palpitation, chest discomfort Gastrointestinal:  Denies nausea, heartburn or change in bowel habits Skin: Denies abnormal skin rashes Lymphatics: Denies new lymphadenopathy or easy bruising Neurological:Denies numbness, tingling or new weaknesses Behavioral/Psych: Mood is stable, no new changes  Extremities: No lower extremity edema Breast:  denies any pain or lumps or nodules in either breasts All other systems were reviewed with the patient and are negative.  I have reviewed the past medical history, past surgical history, social history and family history with the patient  and they are unchanged from previous note.  ALLERGIES:  is allergic to shellfish allergy; lactose intolerance (gi); brilinta [ticagrelor]; penicillins; aleve [naproxen]; aspirin; ciprofloxacin; doxycycline; evista [raloxifene]; fosamax [alendronate sodium]; latex; naproxen sodium; nitrofurantoin; pravachol [pravastatin]; sulfa antibiotics;  tape; welchol [colesevelam hcl]; and zocor [simvastatin].  MEDICATIONS:  Current Outpatient Medications  Medication Sig Dispense Refill  . acetaminophen (TYLENOL) 325 MG tablet Take 325 mg by mouth every 6 (six) hours as needed (for pain or headaches).    . Ascorbic Acid (VITAMIN C) 100 MG tablet Take 100 mg by mouth daily.    Marland Kitchen aspirin EC 81 MG EC tablet Take 1 tablet (81 mg total) by mouth daily.    . calcium carbonate (OS-CAL) 600 MG TABS Take 600 mg by mouth 2 (two) times daily with a meal.    . carboxymethylcellulose (REFRESH PLUS) 0.5 % SOLN Place 1 drop into both eyes 4 (four) times daily.     . Cholecalciferol (VITAMIN D) 2000 UNITS tablet Take 2,000 Units by mouth 2 (two) times daily.     . clopidogrel (PLAVIX) 75 MG tablet TAKE 1 TABLET BY MOUTH  DAILY 90 tablet 0  . dexlansoprazole (DEXILANT) 60 MG capsule Take 60 mg by mouth daily.    Marland Kitchen escitalopram (LEXAPRO) 20 MG tablet Take 20 mg by mouth daily.    Marland Kitchen ezetimibe (ZETIA) 10 MG tablet Take 10 mg by mouth at bedtime.    . fexofenadine (ALLEGRA) 180 MG tablet Take 180 mg by mouth daily.    . furosemide (LASIX) 20 MG tablet Take 1 tablet (20 mg total) by mouth every other day. 45 tablet 3  . gabapentin (NEURONTIN) 600 MG tablet Take 1 tablet (600 mg total) by mouth 3 (three) times daily. 90 tablet 3  . LORazepam (ATIVAN) 1 MG tablet Take 0.5-1 mg by mouth 2 (two) times daily as needed for anxiety or sleep.     Marland Kitchen lovastatin (MEVACOR) 40 MG tablet Take 40 mg by mouth at bedtime.    . meclizine (ANTIVERT) 25 MG tablet Take 12.5-25 mg by mouth 3 (three) times daily as needed for dizziness.    . nitroGLYCERIN (NITROSTAT) 0.4 MG SL tablet Place 1 tablet (0.4 mg total) under the tongue every 5 (five) minutes as needed for chest pain (CP or SOB). 25 tablet 3  . ranitidine (ZANTAC) 150 MG tablet Take 150 mg by mouth at bedtime.    . sodium chloride (OCEAN) 0.65 % SOLN nasal spray Place 2 sprays into both nostrils as needed for congestion.      No current facility-administered medications for this visit.     PHYSICAL EXAMINATION: ECOG PERFORMANCE STATUS: 1 - Symptomatic but completely ambulatory  Vitals:   11/15/17 0951  BP: 119/67  Pulse: 63  Resp: 17  Temp: 98.6 F (37 C)  SpO2: 98%   Filed Weights   11/15/17 0951  Weight: 184 lb 4.8 oz (83.6 kg)    GENERAL:alert, no distress and comfortable SKIN: skin color, texture, turgor are normal, no rashes or significant lesions EYES: normal, Conjunctiva are pink and non-injected, sclera clear OROPHARYNX:no exudate, no erythema and lips, buccal mucosa, and tongue normal  NECK: supple, thyroid normal size, non-tender, without nodularity LYMPH:  no palpable lymphadenopathy in the cervical, axillary or inguinal LUNGS: clear to auscultation and percussion with normal breathing effort HEART: regular rate & rhythm and no murmurs and no lower extremity edema ABDOMEN:abdomen soft, non-tender and normal bowel sounds MUSCULOSKELETAL:no cyanosis of digits and no clubbing  NEURO: alert &  oriented x 3 with fluent speech, no focal motor/sensory deficits EXTREMITIES: No lower extremity edema  LABORATORY DATA:  I have reviewed the data as listed CMP Latest Ref Rng & Units 11/08/2017 08/01/2017 08/01/2017  Glucose 65 - 99 mg/dL 80 - 165(H)  BUN 8 - 27 mg/dL 15 - 6  Creatinine 0.57 - 1.00 mg/dL 0.80 0.89 0.90  Sodium 134 - 144 mmol/L 144 - 139  Potassium 3.5 - 5.2 mmol/L 3.9 - 4.2  Chloride 96 - 106 mmol/L 104 - 101  CO2 20 - 29 mmol/L 21 - 25  Calcium 8.7 - 10.3 mg/dL 9.0 - 8.9  Total Protein 6.0 - 8.5 g/dL - - -  Total Bilirubin 0.0 - 1.2 mg/dL - - -  Alkaline Phos 39 - 117 IU/L - - -  AST 0 - 40 IU/L - - -  ALT 0 - 32 IU/L - - -    Lab Results  Component Value Date   WBC 6.1 11/14/2017   HGB 11.2 (L) 11/14/2017   HCT 35.7 11/14/2017   MCV 90.8 11/14/2017   PLT 208 11/14/2017   NEUTROABS 6.9 07/31/2017    ASSESSMENT & PLAN:  Breast cancer of upper-outer quadrant of  right female breast ([place]) Diagnosed 2016 treated with lumpectomy, Taxotere/Cytoxan as well as radiation therapy, triple negative  Iron deficiency anemia 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 Patient received blood transfusion at [facility].  IV Iron given on 02/17/2017 x 2 doses  Lab review: Hemoglobin 11.2, ferritin 27, iron saturation 11% I discussed with her that her iron levels are starting to come down as well as her energy levels.  Because of this I recommended that she get 2 dose of IV iron 1 week apart.  Previously whenever she got iron she had improvement in her energy levels.  Return to clinic in 6 months with labs and follow-up    Orders Placed This Encounter  Procedures  . Ferritin    Standing Status:   Future    Standing Expiration Date:   11/15/2018  . Iron and TIBC    Standing Status:   Future    Standing Expiration Date:   11/15/2018  . CBC with Differential (Cancer Center Only)    Standing Status:   Future    Standing Expiration Date:   11/16/2018   The patient has a good understanding of the overall plan. she agrees with it. she will call with any problems that may develop before the next visit here.   Harriette Ohara, MD 11/15/17

## 2017-11-15 NOTE — Assessment & Plan Note (Signed)
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 Patient received blood transfusion at [facility].  IV Iron given on 02/17/2017 x 2 doses  Lab review: Hemoglobin 11.2, ferritin 27, iron saturation 11% I discussed with her that her iron levels are starting to come down but at this time there is no need for IV Iron  Return to clinic in 6 months with labs and follow-up

## 2017-11-15 NOTE — Assessment & Plan Note (Signed)
Diagnosed 2016 treated with lumpectomy, Taxotere/Cytoxan as well as radiation therapy, triple negative

## 2017-11-15 NOTE — Telephone Encounter (Signed)
Gave patient avs and calendar of upcoming appts.  °

## 2017-11-22 ENCOUNTER — Inpatient Hospital Stay: Payer: Medicare Other

## 2017-11-22 VITALS — BP 128/77 | HR 70 | Temp 99.6°F | Resp 18 | Ht 60.0 in | Wt 183.8 lb

## 2017-11-22 DIAGNOSIS — D508 Other iron deficiency anemias: Secondary | ICD-10-CM

## 2017-11-22 DIAGNOSIS — D509 Iron deficiency anemia, unspecified: Secondary | ICD-10-CM | POA: Diagnosis not present

## 2017-11-22 MED ORDER — SODIUM CHLORIDE 0.9 % IV SOLN
510.0000 mg | Freq: Once | INTRAVENOUS | Status: AC
  Administered 2017-11-22: 510 mg via INTRAVENOUS
  Filled 2017-11-22: qty 17

## 2017-11-22 MED ORDER — SODIUM CHLORIDE 0.9 % IV SOLN
Freq: Once | INTRAVENOUS | Status: AC
  Administered 2017-11-22: 13:00:00 via INTRAVENOUS

## 2017-11-22 NOTE — Patient Instructions (Signed)

## 2017-11-29 ENCOUNTER — Inpatient Hospital Stay: Payer: Medicare Other

## 2017-11-29 VITALS — BP 127/77 | HR 68 | Temp 98.4°F | Resp 18

## 2017-11-29 DIAGNOSIS — D508 Other iron deficiency anemias: Secondary | ICD-10-CM

## 2017-11-29 DIAGNOSIS — D509 Iron deficiency anemia, unspecified: Secondary | ICD-10-CM | POA: Diagnosis not present

## 2017-11-29 MED ORDER — FERUMOXYTOL INJECTION 510 MG/17 ML
510.0000 mg | Freq: Once | INTRAVENOUS | Status: AC
  Administered 2017-11-29: 510 mg via INTRAVENOUS
  Filled 2017-11-29: qty 17

## 2017-11-29 NOTE — Patient Instructions (Signed)

## 2017-12-19 ENCOUNTER — Other Ambulatory Visit: Payer: Self-pay | Admitting: Cardiology

## 2017-12-19 ENCOUNTER — Ambulatory Visit: Payer: Medicare Other | Admitting: Nurse Practitioner

## 2018-01-09 ENCOUNTER — Other Ambulatory Visit: Payer: Self-pay | Admitting: Family Medicine

## 2018-01-09 DIAGNOSIS — R1013 Epigastric pain: Secondary | ICD-10-CM

## 2018-01-16 ENCOUNTER — Ambulatory Visit
Admission: RE | Admit: 2018-01-16 | Discharge: 2018-01-16 | Disposition: A | Discharge status: Home / Self Care | Payer: Medicare Other | Source: Ambulatory Visit | Attending: Family Medicine | Admitting: Family Medicine

## 2018-01-16 DIAGNOSIS — R1013 Epigastric pain: Secondary | ICD-10-CM

## 2018-01-29 ENCOUNTER — Other Ambulatory Visit: Payer: Self-pay | Admitting: Nurse Practitioner

## 2018-01-29 MED ORDER — EZETIMIBE 10 MG PO TABS: 10.0000 mg | ORAL_TABLET | Freq: Every day | ORAL | 2 refills | Status: DC

## 2018-02-13 ENCOUNTER — Other Ambulatory Visit: Payer: Self-pay | Admitting: General Surgery

## 2018-02-13 DIAGNOSIS — K8020 Calculus of gallbladder without cholecystitis without obstruction: Secondary | ICD-10-CM

## 2018-02-16 ENCOUNTER — Ambulatory Visit
Admission: RE | Admit: 2018-02-16 | Discharge: 2018-02-16 | Disposition: A | Discharge status: Home / Self Care | Payer: Medicare Other | Source: Ambulatory Visit | Attending: General Surgery | Admitting: General Surgery

## 2018-02-16 ENCOUNTER — Encounter: Payer: Self-pay | Admitting: Radiology

## 2018-02-16 DIAGNOSIS — K8020 Calculus of gallbladder without cholecystitis without obstruction: Secondary | ICD-10-CM

## 2018-02-16 MED ORDER — IOPAMIDOL (ISOVUE-300) INJECTION 61%
100.0000 mL | Freq: Once | INTRAVENOUS | Status: AC | PRN
  Administered 2018-02-16: 100 mL via INTRAVENOUS

## 2018-02-16 NOTE — Progress Notes (Signed)
Patient began to feel ill after CT scan  She reported dizziness and shaky feeling.  Pt reported having nothing to eat for several hours. Pt was provided crackers and ginger ale, and reportedly felt markedly better following these.

## 2018-02-28 ENCOUNTER — Encounter: Payer: Self-pay | Admitting: Nurse Practitioner

## 2018-02-28 ENCOUNTER — Ambulatory Visit: Payer: Medicare Other | Admitting: Nurse Practitioner

## 2018-02-28 VITALS — BP 120/70 | HR 66 | Ht 60.0 in | Wt 188.8 lb

## 2018-02-28 DIAGNOSIS — I2510 Atherosclerotic heart disease of native coronary artery without angina pectoris: Secondary | ICD-10-CM

## 2018-02-28 NOTE — Progress Notes (Signed)
CARDIOLOGY OFFICE NOTE  Date:  02/28/2018    Angela Bond Date of Birth: [birth year] Medical Record #[id number]  PCP:  Gaynelle Arabian, MD  Cardiologist:  Marisa Cyphers    Chief Complaint  Patient presents with  . Coronary Artery Disease    4 month check. Seen for Dr. Marlou Porch    History of Present Illness: Angela Bond is a 72 y.o. female who presents today for a 4 month check. Seen for Dr. Marlou Porch.  She has a complex history - she hasCAD s/pnon-STEMI with drug-eluting stent to her LAD in 2017(99% bifurcation lesion in the LAD that was stented and had a jailed diagonal that was dilated), orthostatic hypotension, right breast CA, GERD, HTN and HLD.   She has been intolerant to several medicines. Metoprolol stopped in the past due to dizziness. She has had her ACE stopped as well due to orthostasis.  Saw Dr. Marlou Porch last in February of 2019 and was felt to be doing ok. I then saw her in March after an ER visit for chest pain - she had echo and was recathed - to continue with medical management. She then went back to the ER after having a fall - apparently some hoarding behaviors noted. At the time of the last visit - she was doing ok but with lots of somatic complaints.   Comes in today. Here alone today. She continues to have several complaints. She remains dizzy however, this is chronic. She has had some falls several times since her last visit. She says she "just falls when I bend over".  She does not pass out. Her balance is very poor per her report. She will also throw up or her food comes back up if she bends over at eating. She has had a recent CT of her belly - she does have some gallstones noted. Her PCP sent her to see Dr. Marlou Starks. She is waiting on test results and plan of action. She is asking about pap smears. She also notes fungal infection in her perineum. No chest pain noted. She has had more swelling in her legs - she does probably use too much salt. There  is some eating out. She does not wear support stockings. She is taking her Lasix QOD. She does stay chronically anxious and "picks" at her skin.   Past Medical History:  Diagnosis Date  . Anemia    hx  . Anxiety and depression   . Aortic atherosclerosis ([place]) 07/31/2017  . Arthritis    "~ all over my body; in the joints"  . Breast cancer, right ([place]) 3/16  . Coronary artery disease    a.  s/p NSTEMI in June 2017 with DES to mid LAD.  Marland Kitchen Depression   . GERD (gastroesophageal reflux disease)   . H/O hiatal hernia   . History of blood transfusion    "when taking chemo & w/one of my knee replacements" (11/05/2015)  . Hypercholesteremia   . Hypertension   . Iron deficiency anemia 02/16/2017  . Orthostatic hypotension   . Personal history of chemotherapy   . Personal history of radiation therapy   . Pneumonia X 1  . Shingles 5/15   hx  . Sliding hiatal hernia s/p lap PEH repair 07/31/2012  . Unstable angina ([place]) 01/23/2012    Past Surgical History:  Procedure Laterality Date  . BREAST BIOPSY Right   . BREAST LUMPECTOMY Right 08/2014  . BREAST LUMPECTOMY WITH SENTINEL LYMPH NODE BIOPSY Right  09/22/2014   Procedure: RIGHT BREAST LUMPECTOMY WITH NEEDLE LOCALIZATION AND RIGHT AXILLARY SENTINEL LYMPH NODE BIOPSY;  Surgeon: [name], MD;  Location: [facility];  Service: General;  Laterality: Right;  . CARDIAC CATHETERIZATION    . CARDIAC CATHETERIZATION N/A 11/05/2015   Procedure: Left Heart Cath and Coronary Angiography;  Surgeon: Belva Crome, MD;  Location: [facility];  Service: Cardiovascular;  Laterality: N/A;  . CARDIAC CATHETERIZATION N/A 11/05/2015   Procedure: Coronary Stent Intervention;  Surgeon: Belva Crome, MD;  Location: [facility];  Service: Cardiovascular;  Laterality: N/A;  . CARPAL TUNNEL RELEASE Right 09/2015  . CORONARY ANGIOPLASTY WITH STENT PLACEMENT  11/05/2015   "1 stent"  . ESOPHAGEAL MANOMETRY N/A 07/02/2012   Procedure: ESOPHAGEAL MANOMETRY (EM);   Surgeon: Lear Ng, MD;  Location: [facility];  Service: Endoscopy;  Laterality: N/A;  schooler to read  . EYE SURGERY Left 2013   tube placed tear duct  . HIATAL HERNIA REPAIR N/A 08/10/2012   Procedure: LAPAROSCOPIC REPAIR OF HIATAL HERNIA WITH MESH AND EGD WITH PEG TUBE PLACEMENT;  Surgeon: Ralene Ok, MD;  Location: [facility];  Service: General;  Laterality: N/A;  . LEFT HEART CATH AND CORONARY ANGIOGRAPHY N/A 08/01/2017   Procedure: LEFT HEART CATH AND CORONARY ANGIOGRAPHY;  Surgeon: Jettie Booze, MD;  Location: [facility];  Service: Cardiovascular;  Laterality: N/A;  . PORT-A-CATH REMOVAL  06/2015  . PORTACATH PLACEMENT Left 09/22/2014   Procedure: INSERTION PORT-A-CATH;  Surgeon: [name], MD;  Location: [facility];  Service: General;  Laterality: Left;  . POSTERIOR LUMBAR FUSION  2016   "had 2 vertebrae fused"  . TONSILLECTOMY  as child  . TOTAL KNEE ARTHROPLASTY Bilateral 2011,2012     Medications: Current Meds  Medication Sig  . acetaminophen (TYLENOL) 325 MG tablet Take 325 mg by mouth every 6 (six) hours as needed (for pain or headaches).  Marland Kitchen aspirin EC 81 MG EC tablet Take 1 tablet (81 mg total) by mouth daily.  . calcium carbonate (OS-CAL) 600 MG TABS Take 600 mg by mouth 2 (two) times daily with a meal.  . carboxymethylcellulose (REFRESH PLUS) 0.5 % SOLN Place 1 drop into both eyes 4 (four) times daily.   . Cholecalciferol (VITAMIN D) 2000 UNITS tablet Take 2,000 Units by mouth 2 (two) times daily.   . clopidogrel (PLAVIX) 75 MG tablet TAKE 1 TABLET BY MOUTH  DAILY  . dexlansoprazole (DEXILANT) 60 MG capsule Take 60 mg by mouth daily.  Marland Kitchen escitalopram (LEXAPRO) 20 MG tablet Take 20 mg by mouth daily.  Marland Kitchen ezetimibe (ZETIA) 10 MG tablet Take 1 tablet (10 mg total) by mouth at bedtime.  . fexofenadine (ALLEGRA) 180 MG tablet Take 180 mg by mouth daily.  . furosemide (LASIX) 20 MG tablet Take 1 tablet (20 mg total) by mouth every other day.  .  gabapentin (NEURONTIN) 600 MG tablet Take 1 tablet (600 mg total) by mouth 3 (three) times daily.  Marland Kitchen LORazepam (ATIVAN) 1 MG tablet Take 0.5-1 mg by mouth 2 (two) times daily as needed for anxiety or sleep.   Marland Kitchen lovastatin (MEVACOR) 40 MG tablet Take 40 mg by mouth at bedtime.  . meclizine (ANTIVERT) 25 MG tablet Take 12.5-25 mg by mouth 3 (three) times daily as needed for dizziness.  . nitroGLYCERIN (NITROSTAT) 0.4 MG SL tablet Place 1 tablet (0.4 mg total) under the tongue every 5 (five) minutes as needed for chest pain (CP or  SOB).  . ranitidine (ZANTAC) 150 MG tablet Take 150 mg by mouth at bedtime.  . sodium chloride (OCEAN) 0.65 % SOLN nasal spray Place 2 sprays into both nostrils as needed for congestion.  . vitamin C (ASCORBIC ACID) 500 MG tablet Take 500 mg by mouth daily.  . [DISCONTINUED] Ascorbic Acid (VITAMIN C) 100 MG tablet Take 100 mg by mouth daily.     Allergies: Allergies  Allergen Reactions  . Shellfish Allergy Anaphylaxis    ANAPHYLAXIS TO OYSTERS  . Lactose Intolerance (Gi) Nausea And Vomiting  . Brilinta [Ticagrelor] Other (See Comments)    Indigestion  . Penicillins Nausea And Vomiting    Has patient had a PCN reaction causing immediate rash, facial/tongue/throat swelling, SOB or lightheadedness with hypotension: Yes Has patient had a PCN reaction causing severe rash involving mucus membranes or skin necrosis: No Has patient had a PCN reaction that required hospitalization: No Has patient had a PCN reaction occurring within the last 10 years: No If all of the above answers are "NO", then may proceed with Cephalosporin use.   [name] [Naproxen] Nausea And Vomiting  . Aspirin Other (See Comments)    Burns stomach  . Ciprofloxacin Nausea And Vomiting  . Doxycycline Nausea And Vomiting  . Evista [Raloxifene] Other (See Comments)    LEG ACHES  . Fosamax [Alendronate Sodium] Other (See Comments)    Leg aches  . Latex Rash  . Naproxen Sodium Nausea And Vomiting    . Nitrofurantoin Rash    "I broke out down there"  . Pravachol [Pravastatin] Other (See Comments)    GI upset   . Sulfa Antibiotics Nausea And Vomiting  . Tape Rash  . Welchol [Colesevelam Hcl] Other (See Comments)    Stomach cramps  . Zocor [Simvastatin] Rash    Social History: The patient  reports that she has never smoked. She has never used smokeless tobacco. She reports that she does not drink alcohol or use drugs.   Family History: The patient's family history includes Cancer in her father and sister; Diabetes in her father and sister; Heart disease in her father, mother, and sister.   Review of Systems: Please see the history of present illness.   Otherwise, the review of systems is positive for none.   All other systems are reviewed and negative.   Physical Exam: VS:  BP 120/70 (BP Location: Left Arm, Patient Position: Sitting, Cuff Size: Normal)   Pulse 66   Ht 5' (1.524 m)   Wt 188 lb 12.8 oz (85.6 kg)   SpO2 94%   BMI 36.87 kg/m  .  BMI Body mass index is 36.87 kg/m.  Wt Readings from Last 3 Encounters:  02/28/18 188 lb 12.8 oz (85.6 kg)  11/22/17 183 lb 12.8 oz (83.4 kg)  11/15/17 184 lb 4.8 oz (83.6 kg)    General: Pleasant. Alert and in no acute distress.   HEENT: Normal.  Neck: Supple, no JVD, carotid bruits, or masses noted.  Cardiac: Regular rate and rhythm. No murmurs, rubs, or gallops. She has 1+ bilateral edema.  Respiratory:  Lungs are clear to auscultation bilaterally with normal work of breathing.  GI: Soft and nontender.  MS: No deformity or atrophy. Gait and ROM intact.  Skin: Warm and dry. Color is normal. She has multiple "pick" areas over her arms and legs.  Neuro:  Strength and sensation are intact and no gross focal deficits noted.  Psych: Alert, appropriate and with normal affect.   LABORATORY DATA:  EKG:  EKG is not ordered today.  Lab Results  Component Value Date   WBC 6.1 11/14/2017   HGB 11.2 (L) 11/14/2017   HCT 35.7  11/14/2017   PLT 208 11/14/2017   GLUCOSE 80 11/08/2017   CHOL 153 08/01/2017   TRIG 63 08/01/2017   HDL 59 08/01/2017   LDLCALC 81 08/01/2017   ALT 11 03/21/2017   AST 18 03/21/2017   NA 144 11/08/2017   K 3.9 11/08/2017   CL 104 11/08/2017   CREATININE 0.80 11/08/2017   BUN 15 11/08/2017   CO2 21 11/08/2017   TSH 1.640 09/20/2013   INR 1.05 08/01/2017   HGBA1C 5.5 11/05/2015       BNP (last 3 results) Recent Labs    07/31/17 1651  BNP 47.8    ProBNP (last 3 results) Recent Labs    03/21/17 0935  PROBNP 177     Other Studies Reviewed Today:  Cardiac catheterization 07/31/17:   Mid RCA lesion is 25% stenosed.  Previously placed Prox LAD stent, DES,is widely patent.  Ost 1st Diag lesion is 75% stenosed. The ostium is jailed by the stent. THis is a small vessel.  Mid LAD lesion is 25% stenosed.  The left ventricular systolic function is normal.  LV end diastolic pressure is mildly elevated.  The left ventricular ejection fraction is 55-65% by visual estimate.  There is no aortic valve stenosis.  Continue medical therapy. I encouraged her to be more active to try to prevent deconditioning.    EchoStudy Conclusions3/2019  - Left ventricle: The cavity size was normal. Systolic function was normal. The estimated ejection fraction was in the range of 55% to 60%. Wall motion was normal; there were no regional wall motion abnormalities. Left ventricular diastolic function parameters were normal. - Pulmonary arteries: PA peak pressure: 32 mm Hg (S).  NUC stress 01/16/16: 1. Septal scar/infarction. No findings for ischemia.  2. Septal wall motion abnormality and bulging of the inferoseptal wall with contraction.  3. Left ventricular ejection fraction 68%  4. Non invasive risk stratification*: Intermediate to low risk.    Assessment & Plan:  1.Coronary artery diseasewith prior MI June 2017 with NSTEMI and DES to the mid  LAD - she was recathed this past March of 2019 for chest pain - stable findings and stable echo - she has not tolerated beta blocker in the past - she has not been able to be on ACE due to prior othostatic hypotension. She is basically just on Plavix and every other day diuretic. No chest pain noted.   2. Prior fall - this continues - seems to be more related to her balance - I do not get the sense this is from low BP.   3. HLD - on statin -  Labs from PCP noted.   4. Orthostasis - no frank syncope.    5. Remotepostoperative atrial fibrillation in 2014during PEG tube placement, EGD -No further episodes. No anticoagulation warranted at that time.Hopefully this will not recur. She would be too high risk for anticoagulation given her frequency of falls.   6. Depression- she is back on Lexapro  7. Lower extremity edema - she is going to use extra lasix for just 3 days - will need to do this cautiously. Needs to restrict her salt and try support stockings.   8. Probable fungal rash - she is going to check with GYN.  Current medicines are reviewed with the patient today.  The patient does not have  concerns regarding medicines other than what has been noted above.  The following changes have been made:  See above.  Labs/ tests ordered today include:   No orders of the defined types were placed in this encounter.    Disposition:   FU with Dr. Marlou Porch in 6 months.    Patient is agreeable to this plan and will call if any problems develop in the interim.   SignedTruitt Merle, NP  02/28/2018 3:06 PM  [facility] [address] [place], [place]  [address] Phone: [phone] Fax: [phone]

## 2018-02-28 NOTE — Patient Instructions (Addendum)
We will be checking the following labs today - NONE  If you have labs (blood work) drawn today and your tests are completely normal, you will receive your results only by: Marland Kitchen MyChart Message (if you have MyChart) OR . A paper copy in the mail If you have any lab test that is abnormal or we need to change your treatment, we will call you to review the results.   Medication Instructions:    Continue with your current medicines.   Take your fluid pill every day for 3 days and then back to just every other day   If you need a refill on your cardiac medications before your next appointment, please call your pharmacy.     Testing/Procedures To Be Arranged:  N/A  Follow-Up:   See Dr. Marlou Porch in about 6 months    At [facility], you and your health needs are our priority.  As part of our continuing mission to provide you with exceptional heart care, we have created designated Provider Care Teams.  These Care Teams include your primary Cardiologist (physician) and Advanced Practice Providers (APPs -  Physician Assistants and Nurse Practitioners) who all work together to provide you with the care you need, when you need it.  Special Instructions:  . Really try to restrict your salt as best you can.  . Try to get some knee high compression stockings - with at least light to medium compression . Would try to get back to see your gynecologist to discuss Pap smear and the fungal infection .  Call the [facility] office at [phone] if you have any questions, problems or concerns.

## 2018-03-01 ENCOUNTER — Telehealth: Payer: Self-pay

## 2018-03-01 NOTE — Telephone Encounter (Signed)
   [facility] Pre-operative Risk Assessment    Request for surgical clearance:  1. What type of surgery is being performed? [facility]   2. When is this surgery scheduled? TBD   3. What type of clearance is required (medical clearance vs. Pharmacy clearance to hold med vs. Both)? Both  4. Are there any medications that need to be held prior to surgery and how long? plavix for 5 days preoperatively   5. Practice name and name of physician performing surgery? [facility]    6. What is your office phone number [phone]    7.   What is your office fax number [phone]  8.   Anesthesia type (None, local, MAC, general) ?           General

## 2018-03-02 NOTE — Telephone Encounter (Signed)
   Primary Cardiologist: Candee Furbish, MD  Chart reviewed as part of pre-operative protocol coverage. Pt recently seen by Truitt Merle, NP, 2 days ago on 02/28/18 and denied chest pain. Pt is on Plavix for CAD.   I will route to Dr. Marlou Porch to see if ok to hold Plavix for 5 days prior to procedure.   Final clearance pending  Lyda Jester, PA-C 03/02/2018, 3:00 PM

## 2018-03-05 NOTE — Telephone Encounter (Signed)
She may hold her Plavix 5 days prior to surgery.  Please resume day after surgery if possible. Candee Furbish, MD

## 2018-03-06 NOTE — Telephone Encounter (Signed)
   Primary Cardiologist: Angela Furbish, MD  Chart reviewed as part of pre-operative protocol coverage. Patient was contacted 03/06/2018 in reference to pre-operative risk assessment for pending surgery as outlined below.  Angela Bond was last seen on 02/28/18 by Angela Merle NP at which time she was not having chest pain and no further testing was warranted. The patient had stable cath 07/2017. I spoke with her by phone and she affirms no new CP, dyspnea or syncope. Therefore, based on ACC/AHA guidelines, the patient would be at acceptable risk for the planned procedure without further cardiovascular testing. Per Dr. Marlou Bond, "She may hold her Plavix 5 days prior to surgery.  Please resume day after surgery if possible."  I will route this recommendation to the requesting party via [facility] fax function and remove from pre-op pool.  Please call with questions.  Angela Pitter, PA-C 03/06/2018, 1:33 PM

## 2018-03-09 ENCOUNTER — Encounter: Payer: Self-pay | Admitting: Podiatry

## 2018-03-09 ENCOUNTER — Ambulatory Visit: Payer: Self-pay

## 2018-03-09 ENCOUNTER — Ambulatory Visit: Payer: Medicare Other | Admitting: Podiatry

## 2018-03-09 DIAGNOSIS — M79674 Pain in right toe(s): Secondary | ICD-10-CM

## 2018-03-09 DIAGNOSIS — M79605 Pain in left leg: Secondary | ICD-10-CM

## 2018-03-09 DIAGNOSIS — B351 Tinea unguium: Secondary | ICD-10-CM

## 2018-03-09 DIAGNOSIS — S93492A Sprain of other ligament of left ankle, initial encounter: Secondary | ICD-10-CM | POA: Diagnosis not present

## 2018-03-09 DIAGNOSIS — M79675 Pain in left toe(s): Secondary | ICD-10-CM

## 2018-03-09 NOTE — Patient Instructions (Addendum)
Protection - utilize boot (or brace) when ambulating Rest - Get plenty of sleep and take it easy during the day Ice - Ice the injured area often, no more than 20 minutes at a time Compression - Keep the injured area wrapped as instructed Elevate - Elevating the injury will reduce swellingankle Ankle Sprain An ankle sprain is a stretch or tear in one of the tough tissues (ligaments) in your ankle. Follow these instructions at home:  Rest your ankle.  Take over-the-counter and prescription medicines only as told by your doctor.  For 2-3 days, keep your ankle higher than the level of your heart (elevated) as much as possible.  If directed, put ice on the area: ? Put ice in a plastic bag. ? Place a towel between your skin and the bag. ? Leave the ice on for 20 minutes, 2-3 times a day.  If you were given a brace: ? Wear it as told. ? Take it off to shower or bathe. ? Try not to move your ankle much, but wiggle your toes from time to time. This helps to prevent swelling.  If you were given an elastic bandage (dressing): ? Take it off when you shower or bathe. ? Try not to move your ankle much, but wiggle your toes from time to time. This helps to prevent swelling. ? Adjust the bandage to make it more comfortable if it feels too tight. ? Loosen the bandage if you lose feeling in your foot, your foot tingles, or your foot gets cold and blue.  If you have crutches, use them as told by your doctor. Continue to use them until you can walk without feeling pain in your ankle. Contact a doctor if:  Your bruises or swelling are quickly getting worse.  Your pain does not get better after you take medicine. Get help right away if:  You cannot feel your toes or foot.  Your toes or your foot looks blue.  You have very bad pain that gets worse. This information is not intended to replace advice given to you by your health care provider. Make sure you discuss any questions you have with your  health care provider. Document Released: 10/19/2007 Document Revised: 10/08/2015 Document Reviewed: 12/02/2014 Elsevier Interactive Patient Education  [employer].

## 2018-03-12 ENCOUNTER — Other Ambulatory Visit: Payer: Self-pay | Admitting: Nurse Practitioner

## 2018-03-16 ENCOUNTER — Ambulatory Visit: Payer: Self-pay | Admitting: General Surgery

## 2018-03-20 ENCOUNTER — Other Ambulatory Visit: Payer: Self-pay

## 2018-03-20 ENCOUNTER — Encounter (HOSPITAL_COMMUNITY)
Admission: RE | Admit: 2018-03-20 | Discharge: 2018-03-20 | Disposition: A | Discharge status: Home / Self Care | Payer: Medicare Other | Source: Ambulatory Visit | Attending: General Surgery | Admitting: General Surgery

## 2018-03-20 ENCOUNTER — Encounter (HOSPITAL_COMMUNITY): Payer: Self-pay

## 2018-03-20 DIAGNOSIS — Z01812 Encounter for preprocedural laboratory examination: Secondary | ICD-10-CM | POA: Insufficient documentation

## 2018-03-20 HISTORY — DX: Acute myocardial infarction, unspecified: I21.9

## 2018-03-20 HISTORY — DX: Presence of spectacles and contact lenses: Z97.3

## 2018-03-20 HISTORY — DX: Unspecified multiple injuries, initial encounter: T07.XXXA

## 2018-03-20 HISTORY — DX: Headache: R51

## 2018-03-20 HISTORY — DX: Presence of dental prosthetic device (complete) (partial): Z97.2

## 2018-03-20 HISTORY — DX: Headache, unspecified: R51.9

## 2018-03-20 HISTORY — DX: Unsteadiness on feet: R26.81

## 2018-03-20 HISTORY — DX: Calculus of gallbladder without cholecystitis without obstruction: K80.20

## 2018-03-20 LAB — CBC
HEMATOCRIT: 38.6 % (ref 36.0–46.0)
Hemoglobin: 11.7 g/dL — ABNORMAL LOW (ref 12.0–15.0)
MCH: 29.3 pg (ref 26.0–34.0)
MCHC: 30.3 g/dL (ref 30.0–36.0)
MCV: 96.7 fL (ref 80.0–100.0)
Platelets: 241 10*3/uL (ref 150–400)
RBC: 3.99 MIL/uL (ref 3.87–5.11)
RDW: 13 % (ref 11.5–15.5)
WBC: 8.6 10*3/uL (ref 4.0–10.5)
nRBC: 0 % (ref 0.0–0.2)

## 2018-03-20 LAB — COMPREHENSIVE METABOLIC PANEL
ALT: 13 U/L (ref 0–44)
ANION GAP: 12 (ref 5–15)
AST: 21 U/L (ref 15–41)
Albumin: 3.6 g/dL (ref 3.5–5.0)
Alkaline Phosphatase: 59 U/L (ref 38–126)
BUN: 9 mg/dL (ref 8–23)
CHLORIDE: 102 mmol/L (ref 98–111)
CO2: 25 mmol/L (ref 22–32)
Calcium: 9.2 mg/dL (ref 8.9–10.3)
Creatinine, Ser: 0.87 mg/dL (ref 0.44–1.00)
GFR calc Af Amer: >60 mL/min (ref >60)
Glucose, Bld: 90 mg/dL (ref 70–99)
POTASSIUM: 3.9 mmol/L (ref 3.5–5.1)
SODIUM: 139 mmol/L (ref 135–145)
Total Bilirubin: 0.7 mg/dL (ref 0.3–1.2)
Total Protein: 7.8 g/dL (ref 6.5–8.1)

## 2018-03-20 NOTE — Pre-Procedure Instructions (Signed)
   ARIYONA EID  03/20/2018     [id number] - [place], [place] - [place] [address] [place] [address] Phone: [phone] Fax: [phone]  [place], [place] [facility] [address] [place] [address] [place] [address] Phone: [phone] Fax: [phone]    Your procedure is scheduled on Wednesday, March 28, 2018  Report to [facility] Admitting at 1:00 P.M.  Call this number if you have problems the morning of surgery:  [phone]   Remember: Hold Plavix 5 days prior to surgery ( per Dr. Marlou Porch).   Do not eat or drink after midnight Tuesday, March 27, 2018  You may drink clear liquids until 12:00P.M .  Clear liquids allowed are:                    Water, Juice (non-citric and without pulp), Carbonated beverages, Clear Tea, Black Coffee only, Plain Jell-O only, Gatorade and Plain Popsicles only    Take these medicines the morning of surgery with A SIP OF WATER: dexlansoprazole (DEXILANT), escitalopram (LEXAPRO), gabapentin (NEURONTIN), eye drops  If needed: Tylenol for pain, fexofenadine (ALLEGRA) for allergies, meclizine (ANTIVERT) for dizziness, Nitro for chest pain, Nasal spray for congestion  Stop taking Aspirin ( unless advised otherwise by your surgeon), vitamin, fish oil and herbal medications. Do not take any NSAIDs ie: Ibuprofen, Advil, Naproxen (Aleve), Motrin, BC and Goody Powder; stop Wednesday, March 21, 2018.  Do not wear jewelry, make-up or nail polish.  Do not wear lotions, powders, or perfumes, or deodorant.  Do not shave 48 hours prior to surgery.    Do not bring valuables to the hospital.  [facility] is not responsible for any belongings or valuables. Contacts, dentures or bridgework may not be worn into surgery.  Patients discharged the day of surgery will not be allowed to drive home.  Special instructions:See " [facility]-  Preparing For Surgery " sheet Please read over the following fact sheets that you were given. Pain Booklet, Coughing and Deep Breathing, MRSA Information and Surgical Site Infection Prevention

## 2018-03-20 NOTE — Progress Notes (Signed)
Pt denies SOB and chest pain. Pt stated that she is under the care of Dr. Marlou Porch, Cardiology. Pt stated that she had a stress test > 10 years ago. Pt stated that she was instructed to take last dose of Plavix on 03/22/18. Pt stated that she was not provided with pre-op instructions regarding Aspirin; lvm with Hassan Rowan, Sales promotion account executive, at [facility] to f/u with pt regarding Aspirin. Pt verbalized understanding of all pre-op instructions. Pt chart forwarded to anesthesia to review cardiac note in Epic.

## 2018-03-21 LAB — SURGICAL PCR SCREEN
MRSA, PCR: POSITIVE — AB
STAPHYLOCOCCUS AUREUS: POSITIVE — AB

## 2018-03-21 NOTE — Anesthesia Preprocedure Evaluation (Addendum)
Anesthesia Evaluation  Patient identified by MRN, date of birth, ID band Patient awake    Reviewed: Allergy & Precautions, NPO status , Patient's Chart, lab work & pertinent test results  Airway Mallampati: I  TM Distance: >3 FB Neck ROM: Full    Dental   Pulmonary    Pulmonary exam normal        Cardiovascular hypertension, Pt. on medications + Past MI  Normal cardiovascular exam     Neuro/Psych Anxiety Depression    GI/Hepatic GERD  Medicated and Controlled,  Endo/Other    Renal/GU      Musculoskeletal   Abdominal   Peds  Hematology   Anesthesia Other Findings   Reproductive/Obstetrics                            Anesthesia Physical Anesthesia Plan  ASA: III  Anesthesia Plan: General   Post-op Pain Management:    Induction:   PONV Risk Score and Plan: 2 and Ondansetron and Treatment may vary due to age or medical condition  Airway Management Planned: Oral ETT  Additional Equipment:   Intra-op Plan:   Post-operative Plan: Extubation in OR  Informed Consent: I have reviewed the patients History and Physical, chart, labs and discussed the procedure including the risks, benefits and alternatives for the proposed anesthesia with the patient or authorized representative who has indicated his/her understanding and acceptance.     Plan Discussed with: CRNA and Surgeon  Anesthesia Plan Comments: (CAD s/p non-STEMI with DES to LAD in 2017. Cardiac clearance per Melina Copa, PA-C 03/06/18 Pt has Bed Bugs)      Anesthesia Quick Evaluation

## 2018-03-27 MED ORDER — VANCOMYCIN HCL IN DEXTROSE 1-5 GM/200ML-% IV SOLN
1000.0000 mg | INTRAVENOUS | Status: AC
  Administered 2018-03-28: 1000 mg via INTRAVENOUS
  Filled 2018-03-27: qty 200

## 2018-03-28 ENCOUNTER — Ambulatory Visit (HOSPITAL_COMMUNITY): Payer: Medicare Other | Admitting: Anesthesiology

## 2018-03-28 ENCOUNTER — Ambulatory Visit (HOSPITAL_COMMUNITY): Payer: Medicare Other | Admitting: Physician Assistant

## 2018-03-28 ENCOUNTER — Encounter (HOSPITAL_COMMUNITY)
Admission: RE | Disposition: A | Discharge status: Home / Self Care | Payer: Self-pay | Source: Ambulatory Visit | Attending: General Surgery

## 2018-03-28 ENCOUNTER — Encounter (HOSPITAL_COMMUNITY): Payer: Self-pay | Admitting: Urology

## 2018-03-28 ENCOUNTER — Ambulatory Visit (HOSPITAL_COMMUNITY)
Admission: RE | Admit: 2018-03-28 | Discharge: 2018-03-29 | Disposition: A | Discharge status: Home / Self Care | Payer: Medicare Other | Source: Ambulatory Visit | Attending: General Surgery | Admitting: General Surgery

## 2018-03-28 ENCOUNTER — Ambulatory Visit (HOSPITAL_COMMUNITY): Payer: Medicare Other

## 2018-03-28 DIAGNOSIS — Z9221 Personal history of antineoplastic chemotherapy: Secondary | ICD-10-CM | POA: Diagnosis not present

## 2018-03-28 DIAGNOSIS — Z882 Allergy status to sulfonamides status: Secondary | ICD-10-CM | POA: Diagnosis not present

## 2018-03-28 DIAGNOSIS — Z881 Allergy status to other antibiotic agents status: Secondary | ICD-10-CM | POA: Insufficient documentation

## 2018-03-28 DIAGNOSIS — Z853 Personal history of malignant neoplasm of breast: Secondary | ICD-10-CM | POA: Insufficient documentation

## 2018-03-28 DIAGNOSIS — Z7902 Long term (current) use of antithrombotics/antiplatelets: Secondary | ICD-10-CM | POA: Diagnosis not present

## 2018-03-28 DIAGNOSIS — Z79899 Other long term (current) drug therapy: Secondary | ICD-10-CM | POA: Diagnosis not present

## 2018-03-28 DIAGNOSIS — K8020 Calculus of gallbladder without cholecystitis without obstruction: Secondary | ICD-10-CM | POA: Diagnosis present

## 2018-03-28 DIAGNOSIS — Z886 Allergy status to analgesic agent status: Secondary | ICD-10-CM | POA: Diagnosis not present

## 2018-03-28 DIAGNOSIS — K8010 Calculus of gallbladder with chronic cholecystitis without obstruction: Secondary | ICD-10-CM | POA: Insufficient documentation

## 2018-03-28 DIAGNOSIS — Z7982 Long term (current) use of aspirin: Secondary | ICD-10-CM | POA: Diagnosis not present

## 2018-03-28 DIAGNOSIS — Z923 Personal history of irradiation: Secondary | ICD-10-CM | POA: Insufficient documentation

## 2018-03-28 DIAGNOSIS — Z419 Encounter for procedure for purposes other than remedying health state, unspecified: Secondary | ICD-10-CM

## 2018-03-28 HISTORY — PX: CHOLECYSTECTOMY: SHX55

## 2018-03-28 SURGERY — LAPAROSCOPIC CHOLECYSTECTOMY WITH INTRAOPERATIVE CHOLANGIOGRAM
Anesthesia: General | Site: Abdomen

## 2018-03-28 MED ORDER — EPHEDRINE SULFATE 50 MG/ML IJ SOLN
INTRAMUSCULAR | Status: DC | PRN
  Administered 2018-03-28 (×5): 10 mg via INTRAVENOUS

## 2018-03-28 MED ORDER — ACETAMINOPHEN 500 MG PO TABS
ORAL_TABLET | ORAL | Status: AC
  Administered 2018-03-28: 1000 mg via ORAL
  Filled 2018-03-28: qty 2

## 2018-03-28 MED ORDER — SUGAMMADEX SODIUM 200 MG/2ML IV SOLN
INTRAVENOUS | Status: DC | PRN
  Administered 2018-03-28: 200 mg via INTRAVENOUS

## 2018-03-28 MED ORDER — ACETAMINOPHEN 500 MG PO TABS
1000.0000 mg | ORAL_TABLET | ORAL | Status: AC
  Administered 2018-03-28: 1000 mg via ORAL

## 2018-03-28 MED ORDER — DEXAMETHASONE SODIUM PHOSPHATE 10 MG/ML IJ SOLN
INTRAMUSCULAR | Status: AC
  Filled 2018-03-28: qty 1

## 2018-03-28 MED ORDER — CHLORHEXIDINE GLUCONATE CLOTH 2 % EX PADS: 6.0000 | MEDICATED_PAD | Freq: Once | CUTANEOUS | Status: DC

## 2018-03-28 MED ORDER — BUPIVACAINE-EPINEPHRINE 0.25% -1:200000 IJ SOLN
INTRAMUSCULAR | Status: DC | PRN
  Administered 2018-03-28: 18 mL

## 2018-03-28 MED ORDER — GLYCOPYRROLATE 0.2 MG/ML IJ SOLN
INTRAMUSCULAR | Status: DC | PRN
  Administered 2018-03-28: 0.2 mg via INTRAVENOUS

## 2018-03-28 MED ORDER — HYDROCODONE-ACETAMINOPHEN 5-325 MG PO TABS
1.0000 | ORAL_TABLET | ORAL | Status: DC | PRN
  Administered 2018-03-29: 2 via ORAL
  Filled 2018-03-28: qty 2

## 2018-03-28 MED ORDER — MIDAZOLAM HCL 5 MG/5ML IJ SOLN
INTRAMUSCULAR | Status: DC | PRN
  Administered 2018-03-28 (×2): 1 mg via INTRAVENOUS

## 2018-03-28 MED ORDER — SODIUM CHLORIDE 0.9 % IV SOLN
INTRAVENOUS | Status: DC | PRN
  Administered 2018-03-28: 5 mL

## 2018-03-28 MED ORDER — PANTOPRAZOLE SODIUM 40 MG IV SOLR
40.0000 mg | Freq: Every day | INTRAVENOUS | Status: DC
  Administered 2018-03-28: 40 mg via INTRAVENOUS
  Filled 2018-03-28: qty 40

## 2018-03-28 MED ORDER — FUROSEMIDE 20 MG PO TABS
20.0000 mg | ORAL_TABLET | ORAL | Status: DC
  Administered 2018-03-28: 20 mg via ORAL
  Filled 2018-03-28: qty 1

## 2018-03-28 MED ORDER — LIDOCAINE HCL (CARDIAC) PF 100 MG/5ML IV SOSY
PREFILLED_SYRINGE | INTRAVENOUS | Status: DC | PRN
  Administered 2018-03-28: 20 mg via INTRAVENOUS

## 2018-03-28 MED ORDER — ROCURONIUM BROMIDE 50 MG/5ML IV SOSY
PREFILLED_SYRINGE | INTRAVENOUS | Status: AC
  Filled 2018-03-28: qty 5

## 2018-03-28 MED ORDER — MORPHINE SULFATE (PF) 2 MG/ML IV SOLN: 1.0000 mg | INTRAVENOUS | Status: DC | PRN

## 2018-03-28 MED ORDER — SUGAMMADEX SODIUM 200 MG/2ML IV SOLN
INTRAVENOUS | Status: AC
  Filled 2018-03-28: qty 2

## 2018-03-28 MED ORDER — ONDANSETRON HCL 4 MG/2ML IJ SOLN
INTRAMUSCULAR | Status: AC
  Filled 2018-03-28: qty 2

## 2018-03-28 MED ORDER — BUPIVACAINE-EPINEPHRINE (PF) 0.25% -1:200000 IJ SOLN
INTRAMUSCULAR | Status: AC
  Filled 2018-03-28: qty 30

## 2018-03-28 MED ORDER — HEPARIN SODIUM (PORCINE) 5000 UNIT/ML IJ SOLN
5000.0000 [IU] | Freq: Three times a day (TID) | INTRAMUSCULAR | Status: DC
  Administered 2018-03-29: 5000 [IU] via SUBCUTANEOUS
  Filled 2018-03-28: qty 1

## 2018-03-28 MED ORDER — IOPAMIDOL (ISOVUE-300) INJECTION 61%
INTRAVENOUS | Status: AC
  Filled 2018-03-28: qty 50

## 2018-03-28 MED ORDER — PROPOFOL 10 MG/ML IV BOLUS
INTRAVENOUS | Status: DC | PRN
  Administered 2018-03-28: 50 mg via INTRAVENOUS
  Administered 2018-03-28: 100 mg via INTRAVENOUS
  Administered 2018-03-28: 50 mg via INTRAVENOUS

## 2018-03-28 MED ORDER — PHENYLEPHRINE HCL 10 MG/ML IJ SOLN
INTRAMUSCULAR | Status: DC | PRN
  Administered 2018-03-28: 100 ug via INTRAVENOUS

## 2018-03-28 MED ORDER — ROCURONIUM BROMIDE 100 MG/10ML IV SOLN
INTRAVENOUS | Status: DC | PRN
  Administered 2018-03-28: 50 mg via INTRAVENOUS

## 2018-03-28 MED ORDER — PROPOFOL 10 MG/ML IV BOLUS
INTRAVENOUS | Status: AC
  Filled 2018-03-28: qty 20

## 2018-03-28 MED ORDER — ONDANSETRON HCL 4 MG/2ML IJ SOLN
4.0000 mg | Freq: Four times a day (QID) | INTRAMUSCULAR | Status: DC | PRN
  Administered 2018-03-29: 4 mg via INTRAVENOUS
  Filled 2018-03-28: qty 2

## 2018-03-28 MED ORDER — LACTATED RINGERS IV SOLN
INTRAVENOUS | Status: DC
  Administered 2018-03-28: 14:00:00 via INTRAVENOUS

## 2018-03-28 MED ORDER — KCL IN DEXTROSE-NACL 20-5-0.9 MEQ/L-%-% IV SOLN
INTRAVENOUS | Status: DC
  Administered 2018-03-28: 19:00:00 via INTRAVENOUS
  Filled 2018-03-28 (×2): qty 1000

## 2018-03-28 MED ORDER — ONDANSETRON HCL 4 MG/2ML IJ SOLN
INTRAMUSCULAR | Status: DC | PRN
  Administered 2018-03-28: 4 mg via INTRAVENOUS

## 2018-03-28 MED ORDER — DEXAMETHASONE SODIUM PHOSPHATE 10 MG/ML IJ SOLN
INTRAMUSCULAR | Status: DC | PRN
  Administered 2018-03-28: 4 mg via INTRAVENOUS

## 2018-03-28 MED ORDER — SODIUM CHLORIDE 0.9 % IR SOLN
Status: DC | PRN
  Administered 2018-03-28: 1000 mL

## 2018-03-28 MED ORDER — MEPERIDINE HCL 50 MG/ML IJ SOLN: 6.2500 mg | INTRAMUSCULAR | Status: DC | PRN

## 2018-03-28 MED ORDER — FENTANYL CITRATE (PF) 100 MCG/2ML IJ SOLN
INTRAMUSCULAR | Status: DC | PRN
  Administered 2018-03-28: 100 ug via INTRAVENOUS
  Administered 2018-03-28: 50 ug via INTRAVENOUS
  Administered 2018-03-28: 100 ug via INTRAVENOUS

## 2018-03-28 MED ORDER — DIPHENHYDRAMINE-ZINC ACETATE 2-0.1 % EX CREA
1.0000 "application " | TOPICAL_CREAM | Freq: Every evening | CUTANEOUS | Status: DC | PRN
  Filled 2018-03-28: qty 28

## 2018-03-28 MED ORDER — GABAPENTIN 300 MG PO CAPS
300.0000 mg | ORAL_CAPSULE | ORAL | Status: AC
  Administered 2018-03-28: 300 mg via ORAL

## 2018-03-28 MED ORDER — MIDAZOLAM HCL 2 MG/2ML IJ SOLN
INTRAMUSCULAR | Status: AC
  Filled 2018-03-28: qty 2

## 2018-03-28 MED ORDER — ONDANSETRON 4 MG PO TBDP: 4.0000 mg | ORAL_TABLET | Freq: Four times a day (QID) | ORAL | Status: DC | PRN

## 2018-03-28 MED ORDER — GLYCOPYRROLATE PF 0.2 MG/ML IJ SOSY
PREFILLED_SYRINGE | INTRAMUSCULAR | Status: AC
  Filled 2018-03-28: qty 1

## 2018-03-28 MED ORDER — ONDANSETRON HCL 4 MG/2ML IJ SOLN: 4.0000 mg | Freq: Once | INTRAMUSCULAR | Status: DC | PRN

## 2018-03-28 MED ORDER — ESCITALOPRAM OXALATE 20 MG PO TABS
20.0000 mg | ORAL_TABLET | Freq: Every day | ORAL | Status: DC
Start: 2018-03-28 — End: 2018-03-29
  Administered 2018-03-28 – 2018-03-29 (×2): 20 mg via ORAL
  Filled 2018-03-28 (×2): qty 1

## 2018-03-28 MED ORDER — HYDROMORPHONE HCL 1 MG/ML IJ SOLN: 0.2500 mg | INTRAMUSCULAR | Status: DC | PRN

## 2018-03-28 MED ORDER — LORATADINE 10 MG PO TABS
10.0000 mg | ORAL_TABLET | Freq: Every day | ORAL | Status: DC
  Administered 2018-03-28 – 2018-03-29 (×2): 10 mg via ORAL
  Filled 2018-03-28 (×2): qty 1

## 2018-03-28 MED ORDER — LORAZEPAM 1 MG PO TABS
1.0000 mg | ORAL_TABLET | Freq: Every day | ORAL | Status: DC
  Administered 2018-03-28: 1 mg via ORAL
  Filled 2018-03-28: qty 1

## 2018-03-28 MED ORDER — GABAPENTIN 300 MG PO CAPS
ORAL_CAPSULE | ORAL | Status: AC
  Administered 2018-03-28: 300 mg via ORAL
  Filled 2018-03-28: qty 1

## 2018-03-28 MED ORDER — FENTANYL CITRATE (PF) 250 MCG/5ML IJ SOLN
INTRAMUSCULAR | Status: AC
  Filled 2018-03-28: qty 5

## 2018-03-28 MED ORDER — LIDOCAINE 2% (20 MG/ML) 5 ML SYRINGE
INTRAMUSCULAR | Status: AC
  Filled 2018-03-28: qty 5

## 2018-03-28 MED ORDER — 0.9 % SODIUM CHLORIDE (POUR BTL) OPTIME
TOPICAL | Status: DC | PRN
  Administered 2018-03-28: 1000 mL

## 2018-03-28 MED ORDER — EZETIMIBE 10 MG PO TABS
10.0000 mg | ORAL_TABLET | Freq: Every day | ORAL | Status: DC
  Administered 2018-03-28: 10 mg via ORAL
  Filled 2018-03-28: qty 1

## 2018-03-28 MED ORDER — EPHEDRINE 5 MG/ML INJ
INTRAVENOUS | Status: AC
  Filled 2018-03-28: qty 10

## 2018-03-28 MED ORDER — GABAPENTIN 600 MG PO TABS
600.0000 mg | ORAL_TABLET | Freq: Two times a day (BID) | ORAL | Status: DC
  Administered 2018-03-28 – 2018-03-29 (×2): 600 mg via ORAL
  Filled 2018-03-28 (×2): qty 1

## 2018-03-28 SURGICAL SUPPLY — 36 items
ADH SKN CLS APL DERMABOND .7 (GAUZE/BANDAGES/DRESSINGS) ×1
APPLIER CLIP 5 13 M/L LIGAMAX5 (MISCELLANEOUS) ×2
APR CLP MED LRG 5 ANG JAW (MISCELLANEOUS) ×1
BAG SPEC RTRVL LRG 6X4 10 (ENDOMECHANICALS) ×1
CANISTER SUCT 3000ML PPV (MISCELLANEOUS) ×2 IMPLANT
CATH REDDICK CHOLANGI 4FR 50CM (CATHETERS) ×2 IMPLANT
CHLORAPREP W/TINT 26ML (MISCELLANEOUS) ×2 IMPLANT
CLIP APPLIE 5 13 M/L LIGAMAX5 (MISCELLANEOUS) ×1 IMPLANT
COVER MAYO STAND STRL (DRAPES) ×2 IMPLANT
COVER SURGICAL LIGHT HANDLE (MISCELLANEOUS) ×2 IMPLANT
COVER WAND RF STERILE (DRAPES) ×2 IMPLANT
DERMABOND ADVANCED (GAUZE/BANDAGES/DRESSINGS) ×1
DERMABOND ADVANCED .7 DNX12 (GAUZE/BANDAGES/DRESSINGS) ×1 IMPLANT
DRAPE C-ARM 42X72 X-RAY (DRAPES) ×2 IMPLANT
ELECT REM PT RETURN 9FT ADLT (ELECTROSURGICAL) ×2
ELECTRODE REM PT RTRN 9FT ADLT (ELECTROSURGICAL) ×1 IMPLANT
GLOVE SURG SS PI 7.5 STRL IVOR (GLOVE) ×1 IMPLANT
GOWN STRL REUS W/ TWL LRG LVL3 (GOWN DISPOSABLE) ×3 IMPLANT
GOWN STRL REUS W/TWL LRG LVL3 (GOWN DISPOSABLE) ×6
IV CATH 14GX2 1/4 (CATHETERS) ×2 IMPLANT
KIT BASIN OR (CUSTOM PROCEDURE TRAY) ×2 IMPLANT
KIT TURNOVER KIT B (KITS) ×2 IMPLANT
NS IRRIG 1000ML POUR BTL (IV SOLUTION) ×2 IMPLANT
PAD ARMBOARD 7.5X6 YLW CONV (MISCELLANEOUS) ×2 IMPLANT
POUCH SPECIMEN RETRIEVAL 10MM (ENDOMECHANICALS) ×2 IMPLANT
SCISSORS LAP 5X35 DISP (ENDOMECHANICALS) ×2 IMPLANT
SET IRRIG TUBING LAPAROSCOPIC (IRRIGATION / IRRIGATOR) ×2 IMPLANT
SLEEVE ENDOPATH XCEL 5M (ENDOMECHANICALS) ×4 IMPLANT
SPECIMEN JAR SMALL (MISCELLANEOUS) ×2 IMPLANT
SUT MNCRL AB 4-0 PS2 18 (SUTURE) ×2 IMPLANT
TOWEL GREEN STERILE FF (TOWEL DISPOSABLE) ×1 IMPLANT
TRAY LAPAROSCOPIC MC (CUSTOM PROCEDURE TRAY) ×2 IMPLANT
TROCAR XCEL BLUNT TIP 100MML (ENDOMECHANICALS) ×2 IMPLANT
TROCAR XCEL NON-BLD 5MMX100MML (ENDOMECHANICALS) ×2 IMPLANT
TUBING INSUFFLATION (TUBING) ×2 IMPLANT
WATER STERILE IRR 1000ML POUR (IV SOLUTION) ×2 IMPLANT

## 2018-03-28 NOTE — H&P (Signed)
Angela Bond  Location: [facility] Patient #: [id number] DOB: 09-22-1945 Married / Language: English / Race: White Female    The patient is a 72 year old female who presents for a follow-up for Abdominal pain. The patient is a 72 year old white female who is about 3-1/2 years status post right breast lumpectomy and some amount of mapping for a T2 N1 right breast cancer that was triple negative. She was treated with chemotherapy and radiation therapy. She seems to be doing well from that standpoint. Over recent months she has been having left-sided abdominal pain and low back pain. The pain is been associated with nausea but no vomiting. She had an ultrasound done that showed stones in the gallbladder but no gallbladder wall thickening or ductal dilatation.   Allergies Pravachol *ANTIHYPERLIPIDEMICS*  Fosamax *ENDOCRINE AND METABOLIC AGENTS - MISC.*  Sulfa 10 *OPHTHALMIC AGENTS*  Zocor *ANTIHYPERLIPIDEMICS*  Rash. Welchol *ANTIHYPERLIPIDEMICS*  Evista *ENDOCRINE AND METABOLIC AGENTS - MISC.*  Aleve *ANALGESICS - ANTI-INFLAMMATORY*  Vomiting. Lactose *PHARMACEUTICAL ADJUVANTS*  Aspirin *ANALGESICS - NonNarcotic*  Doxycycline Monohydrate *TETRACYCLINES*  Ciprofloxacin *CHEMICALS*  Allergies Reconciled   Medication History  FLUoxetine HCl (20MG  Capsule, Oral) Active. Furosemide (20MG  Tablet, Oral) Active. LORazepam (1MG  Tablet, Oral) Active. Lovastatin (40MG  Tablet, Oral) Active. Polyethylene Glycol 3350 (Oral) Active. RaNITidine HCl (150MG  Tablet, Oral) Active. Sodium & Potassium Bicarbonate (Oral) Active. Gabapentin (600MG  Tablet, Oral) Active. Tylenol (Oral) Specific strength unknown - Active. Vitamin C (Oral) Specific strength unknown - Active. Aspirin (81MG  Tablet DR, Oral) Active. Calcium Carbonate (Oral) Specific strength unknown - Active. Refresh Plus (Ophthalmic) Specific strength unknown - Active. Vitamin D (2000UNIT Capsule,  Oral) Active. Plavix (75MG  Tablet, Oral) Active. Dexilant (60MG  Capsule DR, Oral) Active. Lexapro (20MG  Tablet, Oral) Active. Zetia (10MG  Tablet, Oral) Active. Allegra Allergy (180MG  Tablet, Oral) Active. Antivert (25MG  Tablet, Oral) Active. Nitroglycerin (0.4MG  Tab Sublingual, Sublingual) Active. Medications Reconciled    Review of Systems  General Present- Appetite Loss and Weight Loss. Not Present- Chills, Fatigue, Fever, Night Sweats and Weight Gain. HEENT Present- Seasonal Allergies. Not Present- Earache, Hearing Loss, Hoarseness, Nose Bleed, Oral Ulcers, Ringing in the Ears, Sinus Pain, Sore Throat, Visual Disturbances, Wears glasses/contact lenses and Yellow Eyes. Respiratory Not Present- Bloody sputum, Chronic Cough, Difficulty Breathing, Snoring and Wheezing. Breast Present- Breast Mass. Not Present- Breast Pain, Nipple Discharge and Skin Changes. Cardiovascular Not Present- Chest Pain, Difficulty Breathing Lying Down, Leg Cramps, Palpitations, Rapid Heart Rate, Shortness of Breath and Swelling of Extremities. Gastrointestinal Present- Constipation. Not Present- Abdominal Pain, Bloating, Bloody Stool, Change in Bowel Habits, Chronic diarrhea, Difficulty Swallowing, Excessive gas, Gets full quickly at meals, Hemorrhoids, Indigestion, Nausea, Rectal Pain and Vomiting. Female Genitourinary Not Present- Frequency, Nocturia, Painful Urination, Pelvic Pain and Urgency. Musculoskeletal Present- Joint Pain. Not Present- Back Pain, Joint Stiffness, Muscle Pain, Muscle Weakness and Swelling of Extremities. Neurological Not Present- Decreased Memory, Fainting, Headaches, Numbness, Seizures, Tingling, Tremor, Trouble walking and Weakness. Psychiatric Present- Anxiety and Depression. Not Present- Bipolar, Change in Sleep Pattern, Fearful and Frequent crying. Endocrine Not Present- Cold Intolerance, Excessive Hunger, Hair Changes, Heat Intolerance, Hot flashes and New Diabetes. Hematology  Not Present- Easy Bruising, Excessive bleeding, Gland problems, HIV and Persistent Infections.  Vitals  Weight: 188 lb Height: 60in Body Surface Area: 1.82 m Body Mass Index: 36.72 kg/m  Pain Level: 4/10 Temp.: 96.70F(Temporal)  Pulse: 92 (Regular)  P.OX: 93% (Room air) BP: 108/76 (Sitting, Left Wrist, Standard) Back Pain      Physical Exam  General Mental Status-Alert. General  Appearance-Consistent with stated age. Hydration-Well hydrated. Voice-Normal.  Head and Neck Head-normocephalic, atraumatic with no lesions or palpable masses. Trachea-midline. Thyroid Gland Characteristics - normal size and consistency.  Eye Eyeball - Bilateral-Extraocular movements intact. Sclera/Conjunctiva - Bilateral-No scleral icterus.  Chest and Lung Exam Chest and lung exam reveals -quiet, even and easy respiratory effort with no use of accessory muscles and on auscultation, normal breath sounds, no adventitious sounds and normal vocal resonance. Inspection Chest Wall - Normal. Back - normal.  Breast Note: The right breast and axillary incisions have healed nicely with no sign of infection or seroma. There is no palpable mass in either breast. There is no palpable axillary, supraclavicular, or cervical lymphadenopathy.   Cardiovascular Cardiovascular examination reveals -normal heart sounds, regular rate and rhythm with no murmurs and normal pedal pulses bilaterally.  Abdomen Note: The abdomen is soft. There is tenderness to palpation in the left upper quadrant.   Neurologic Neurologic evaluation reveals -alert and oriented x 3 with no impairment of recent or remote memory. Mental Status-Normal.  Musculoskeletal Normal Exam - Left-Upper Extremity Strength Normal and Lower Extremity Strength Normal. Normal Exam - Right-Upper Extremity Strength Normal and Lower Extremity Strength Normal.  Lymphatic Head & Neck  General Head & Neck  Lymphatics: Bilateral - Description - Normal. Axillary  General Axillary Region: Bilateral - Description - Normal. Tenderness - Non Tender. Femoral & Inguinal  Generalized Femoral & Inguinal Lymphatics: Bilateral - Description - Normal. Tenderness - Non Tender.    Assessment & Plan  GALLSTONES (K80.20) Impression: The patient is about 3-1/2 years status post right breast lumpectomy for breast cancer. Over the last few months she has been having left-sided abdominal pain and back pain with some nausea. She had an ultrasound done that did show stones in the gallbladder but no gallbladder wall thickening or ductal dilatation. Because her pain is in an atypical place for symptomatic gallstones I think she should have a CT scan of her abdomen and pelvis to make sure that there is nothing else that could be causing her symptoms. If her CT is unremarkable other than the stones then I think there is a chance that removing her gallbladder may improve her symptoms somewhat but I cannot guarantee it will take all her pain away since her pain is not typical. I have discussed with her in detail the risks and benefits of the operation as well as some of the technical aspects and she understands and wishes to proceed. We will call her with the results of the CT scan and then proceed accordingly. Current Plans CT ABDOMEN AND PELVIS W CONTRAST [phone])

## 2018-03-28 NOTE — Anesthesia Procedure Notes (Signed)
Performed by: Jonna Munro, CRNA

## 2018-03-28 NOTE — Interval H&P Note (Signed)
History and Physical Interval Note:  03/28/2018 2:14 PM  Angela Bond  has presented today for surgery, with the diagnosis of gallstones  The various methods of treatment have been discussed with the patient and family. After consideration of risks, benefits and other options for treatment, the patient has consented to  Procedure(s): LAPAROSCOPIC CHOLECYSTECTOMY WITH INTRAOPERATIVE CHOLANGIOGRAM ERAS PATHWAY (N/A) as a surgical intervention .  The patient's history has been reviewed, patient examined, no change in status, stable for surgery.  I have reviewed the patient's chart and labs.  Questions were answered to the patient's satisfaction.     [name]

## 2018-03-28 NOTE — Transfer of Care (Signed)
Immediate Anesthesia Transfer of Care Note  Patient: Angela Bond  Procedure(s) Performed: LAPAROSCOPIC CHOLECYSTECTOMY WITH INTRAOPERATIVE CHOLANGIOGRAM ERAS PATHWAY (N/A Abdomen)  Patient Location: PACU  Anesthesia Type:General  Level of Consciousness: awake, alert  and oriented  Airway & Oxygen Therapy: Patient Spontanous Breathing and Patient connected to face mask oxygen  Post-op Assessment: Report given to RN and Post -op Vital signs reviewed and stable  Post vital signs: Reviewed  Last Vitals:  Vitals Value Taken Time  BP 97/57 03/28/2018  4:39 PM  Temp    Pulse 105 03/28/2018  4:41 PM  Resp 12 03/28/2018  4:41 PM  SpO2 100 % 03/28/2018  4:41 PM  Vitals shown include unvalidated device data.  Last Pain:  Vitals:   03/28/18 1325  TempSrc:   PainSc: 0-No pain      Patients Stated Pain Goal: 0 (83/81/84 0375)  Complications: No apparent anesthesia complications

## 2018-03-28 NOTE — Op Note (Signed)
03/28/2018  4:23 PM  PATIENT:  Angela Bond  72 y.o. female  PRE-OPERATIVE DIAGNOSIS:  gallstones  POST-OPERATIVE DIAGNOSIS:  gallstones  PROCEDURE:  Procedure(s): LAPAROSCOPIC CHOLECYSTECTOMY WITH INTRAOPERATIVE CHOLANGIOGRAM ERAS PATHWAY (N/A)  SURGEON:  Surgeon(s) and Role:    * Jovita Kussmaul, MD - Primary  PHYSICIAN ASSISTANT:   ASSISTANTS: none   ANESTHESIA:   local and general  EBL:  10 mL   BLOOD ADMINISTERED:none  DRAINS: none   LOCAL MEDICATIONS USED:  MARCAINE     SPECIMEN:  Source of Specimen:  gallbladder  DISPOSITION OF SPECIMEN:  PATHOLOGY  COUNTS:  YES  TOURNIQUET:  * No tourniquets in log *  DICTATION: .Dragon Dictation     Procedure: After informed consent was obtained the patient was brought to the operating room and placed in the supine position on the operating room table. After adequate induction of general anesthesia the patient's abdomen was prepped with ChloraPrep allowed to dry and draped in usual sterile manner. An appropriate timeout was performed. The area below the umbilicus was infiltrated with quarter percent  Marcaine. A small incision was made with a 15 blade knife. The incision was carried down through the subcutaneous tissue bluntly with a hemostat and Army-Nav[employer] retractors. The linea alba was identified. The linea alba was incised with a 15 blade knife and each side was grasped with Coker clamps. The preperitoneal space was then probed with a hemostat until the peritoneum was opened and access was gained to the abdominal cavity. A 0 Vicryl pursestring stitch was placed in the fascia surrounding the opening. A Hassan cannula was then placed through the opening and anchored in place with the previously placed Vicryl purse string stitch. The abdomen was insufflated with carbon dioxide without difficulty. A laparoscope was inserted through the [facility] cannula in the right upper quadrant was inspected. Next the epigastric region was  infiltrated with % Marcaine. A small incision was made with a 15 blade knife. A 5 mm port was placed bluntly through this incision into the abdominal cavity under direct vision. Next 2 sites were chosen laterally on the right side of the abdomen for placement of 5 mm ports. Each of these areas was infiltrated with quarter percent Marcaine. Small stab incisions were made with a 15 blade knife. 5 mm ports were then placed bluntly through these incisions into the abdominal cavity under direct vision without difficulty. A blunt grasper was placed through the lateralmost 5 mm port and used to grasp the dome of the gallbladder and elevated anteriorly and superiorly. Another blunt grasper was placed through the other 5 mm port and used to retract the body and neck of the gallbladder. A dissector was placed through the epigastric port and using the electrocautery the peritoneal reflection at the gallbladder neck was opened. Blunt dissection was then carried out in this area until the gallbladder neck-cystic duct junction was readily identified and a good window was created. A single clip was placed on the gallbladder neck. A small  ductotomy was made just below the clip with laparoscopic scissors. A 14-gauge Angiocath was then placed through the anterior abdominal wall under direct vision. A Reddick cholangiogram catheter was then placed through the Angiocath and flushed. The catheter was then placed in the cystic duct and anchored in place with a clip. A cholangiogram was obtained that showed no filling defects good emptying into the duodenum an adequate length on the cystic duct. The anchoring clip and catheters were then removed from the patient.  3 clips were placed proximally on the cystic duct and the duct was divided between the 2 sets of clips. Posterior to this the cystic artery was identified and again dissected bluntly in a circumferential manner until a good window  was created. 2 clips were placed proximally  and one distally on the artery and the artery was divided between the 2 sets of clips. Next a laparoscopic hook cautery device was used to separate the gallbladder from the liver bed. Prior to completely detaching the gallbladder from the liver bed the liver bed was inspected and several small bleeding points were coagulated with the electrocautery until the area was completely hemostatic. The gallbladder was then detached the rest of it from the liver bed without difficulty. A laparoscopic bag was inserted through the hassan port. The laparoscope was moved to the epigastric port. The gallbladder was placed within the bag and the bag was sealed.  The bag with the gallbladder was then removed with the [facility] cannula through the infraumbilical port without difficulty. The fascial defect was then closed with the previously placed Vicryl pursestring stitch as well as with another figure-of-eight 0 Vicryl stitch. The liver bed was inspected again and found to be hemostatic. The abdomen was irrigated with copious amounts of saline until the effluent was clear. The ports were then removed under direct vision without difficulty and were found to be hemostatic. The gas was allowed to escape. The skin incisions were all closed with interrupted 4-0 Monocryl subcuticular stitches. Dermabond dressings were applied. The patient tolerated the procedure well. At the end of the case all needle sponge and instrument counts were correct. The patient was then awakened and taken to recovery in stable condition  PLAN OF CARE: Admit for overnight observation  PATIENT DISPOSITION:  PACU - hemodynamically stable.   Delay start of Pharmacological VTE agent (>24hrs) due to surgical blood loss or risk of bleeding: no

## 2018-03-28 NOTE — Anesthesia Procedure Notes (Signed)
Procedure Name: Intubation Date/Time: 03/28/2018 3:18 PM Performed by: Jonna Munro, CRNA Pre-anesthesia Checklist: Patient identified, Emergency Drugs available, Suction available, Patient being monitored and Timeout performed Patient Re-evaluated:Patient Re-evaluated prior to induction Oxygen Delivery Method: Circle system utilized Preoxygenation: Pre-oxygenation with 100% oxygen Induction Type: IV induction Ventilation: Mask ventilation without difficulty Laryngoscope Size: Mac and 3 Grade View: Grade I Tube type: Oral Tube size: 7.0 mm Number of attempts: 1 Airway Equipment and Method: Stylet Secured at: 22 cm Tube secured with: Tape Dental Injury: Teeth and Oropharynx as per pre-operative assessment

## 2018-03-28 NOTE — Anesthesia Postprocedure Evaluation (Signed)
Anesthesia Post Note  Patient: AYOMIDE ZULETA  Procedure(s) Performed: LAPAROSCOPIC CHOLECYSTECTOMY WITH INTRAOPERATIVE CHOLANGIOGRAM ERAS PATHWAY (N/A Abdomen)     Patient location during evaluation: PACU Anesthesia Type: General Level of consciousness: awake and alert, oriented and patient cooperative Pain management: pain level controlled Vital Signs Assessment: post-procedure vital signs reviewed and stable Respiratory status: spontaneous breathing, nonlabored ventilation and respiratory function stable Cardiovascular status: blood pressure returned to baseline and stable Postop Assessment: no apparent nausea or vomiting Anesthetic complications: no    Last Vitals:  Vitals:   03/28/18 1252  BP: (!) 148/69  Pulse: 73  Resp: 18  Temp: 37.1 C  SpO2: 99%    Last Pain:  Vitals:   03/28/18 1325  TempSrc:   PainSc: 0-No pain                 Reshonda Koerber,E. Jheri Mitter

## 2018-03-28 NOTE — Progress Notes (Signed)
Physician notified staff pt has history of bed bugs in office. Bed bugs noted at PAT appointment after blood draw. Pt confirmed that she does have bed bugs at home. Pt bathed in CHG soap last night and this morning and states she put on clean clothes that did not have bed bugs on them this morning. No bugs noted today in pre-op. Belongings placed in double bag and precautions taken.   Notified anesthesiologist of this and CRNA.   Pt also c/o of rash to abdominal folds. Redness noted to this area. Pt states she will show Dr. Marlou Starks this. Pt also states she may go home today, but her husband is the only person who can take care of her at home. Pt reports he has some memory issues and sleeps all of the time. Pt states she will discuss this with Dr. Marlou Starks prior to surgery. I notified Dr. Christella Hartigan of this.

## 2018-03-29 ENCOUNTER — Encounter (HOSPITAL_COMMUNITY): Payer: Self-pay | Admitting: General Surgery

## 2018-03-29 DIAGNOSIS — K8010 Calculus of gallbladder with chronic cholecystitis without obstruction: Secondary | ICD-10-CM | POA: Diagnosis not present

## 2018-03-29 MED ORDER — HYDROCODONE-ACETAMINOPHEN 5-325 MG PO TABS: 1.0000 | ORAL_TABLET | Freq: Four times a day (QID) | ORAL | 0 refills | Status: DC | PRN

## 2018-03-29 MED ORDER — PANTOPRAZOLE SODIUM 40 MG PO TBEC: 40.0000 mg | DELAYED_RELEASE_TABLET | Freq: Every day | ORAL | Status: DC

## 2018-03-29 NOTE — Progress Notes (Signed)
1 Day Post-Op   Subjective/Chief Complaint: Nauseated overnigh but feels better this am   Objective: Vital signs in last 24 hours: Temp:  [97.5 F (36.4 C)-98.7 F (37.1 C)] 98.1 F (36.7 C) (11/14 0443) Pulse Rate:  [73-110] 85 (11/14 0443) Resp:  [12-21] 16 (11/14 0443) BP: (97-148)/(53-90) 106/63 (11/14 0443) SpO2:  [91 %-100 %] 91 % (11/14 0443) Weight:  [83.5 kg] 83.5 kg (11/13 1252) Last BM Date: 03/28/18  Intake/Output from previous day: 11/13 0701 - 11/14 0700 In: 1338.9 [P.O.:240; I.V.:898.9; IV Piggyback:200] Out: 935 [Urine:900; Emesis/NG output:25; Blood:10] Intake/Output this shift: No intake/output data recorded.  General appearance: alert and cooperative Resp: clear to auscultation bilaterally Cardio: regular rate and rhythm GI: soft, mild tenderness  Lab Results:  No results for input(s): WBC, HGB, HCT, PLT in the last 72 hours. BMET No results for input(s): NA, K, CL, CO2, GLUCOSE, BUN, CREATININE, CALCIUM in the last 72 hours. PT/INR No results for input(s): LABPROT, INR in the last 72 hours. ABG No results for input(s): PHART, HCO3 in the last 72 hours.  Invalid input(s): PCO2, PO2  Studies/Results: Dg Cholangiogram Operative  Result Date: 03/28/2018 CLINICAL DATA:  Intraoperative cholangiogram during laparoscopic cholecystectomy. EXAM: INTRAOPERATIVE CHOLANGIOGRAM FLUOROSCOPY TIME:  15 seconds COMPARISON:  CT abdomen and pelvis - 02/16/2018 FINDINGS: Intraoperative cholangiographic images of the right upper abdominal quadrant during laparoscopic cholecystectomy are provided for review. Surgical clips overlie the expected location of the gallbladder fossa. Contrast injection demonstrates selective cannulation of the central aspect of the cystic duct. There is passage of contrast through the central aspect of the cystic duct with filling of a non dilated common bile duct. There is passage of contrast though the CBD and into the descending portion of the  duodenum. There is minimal reflux of injected contrast into the common hepatic duct and central aspect of the non dilated intrahepatic biliary system. There are no discrete filling defects within the opacified portions of the biliary system to suggest the presence of choledocholithiasis. IMPRESSION: No evidence of choledocholithiasis. Electronically Signed   By: Sandi Mariscal M.D.   On: 03/28/2018 16:58    Anti-infectives: Anti-infectives (From admission, onward)   Start     Dose/Rate Route Frequency Ordered Stop   03/28/18 1330  vancomycin (VANCOCIN) IVPB 1000 mg/200 mL premix     1,000 mg 200 mL/hr over 60 Minutes Intravenous To ShortStay Surgical 03/27/18 1019 03/28/18 1600      Assessment/Plan: s/p Procedure(s): LAPAROSCOPIC CHOLECYSTECTOMY WITH INTRAOPERATIVE CHOLANGIOGRAM ERAS PATHWAY (N/A) Advance diet Discharge  LOS: 0 days    [name] 03/29/2018

## 2018-03-29 NOTE — Progress Notes (Signed)
[name] to be D/C'd  per MD order. Discussed with the patient and all questions fully answered.  VSS, Skin clean, dry and intact without evidence of skin break down, no evidence of skin tears noted.  IV catheter discontinued intact. Site without signs and symptoms of complications. Dressing and pressure applied.  An After Visit Summary was printed and given to the patient. Patient received prescription.  D/c education completed with patient/family including follow up instructions, medication list, d/c activities limitations if indicated, with other d/c instructions as indicated by MD - patient able to verbalize understanding, all questions fully answered.   Patient instructed to return to ED, call 911, or call MD for any changes in condition.   Patient to be escorted via [place], and D/C home via private auto.

## 2018-03-30 ENCOUNTER — Ambulatory Visit: Payer: Medicare Other | Admitting: Podiatry

## 2018-03-31 ENCOUNTER — Encounter: Payer: Self-pay | Admitting: Podiatry

## 2018-03-31 NOTE — Progress Notes (Signed)
Subjective: Angela Bond presents today with diabetes, diabetic neuropathy and cc of painful, discolored, thick toenails which interfere with daily activities and routine tasks. Pain is aggravated when wearing enclosed shoe gear. Pain is relieved with periodic debridement.  She relates new problem on today where she fell in her kitchen on yesterday afternoon and twisted her ankle.  She relates pain in the inside of her ankle, front of her ankle and top of her foot. She relates it is painful to walk.  Objective: Vascular Examination: Capillary refill time <3 seconds x 10 digits Dorsalis pedis and posterior tibial pulses present b/l No digital hair x 10 digits Skin temperature warm to warm b/l  Dermatological Examination: Skin with normal turgor, texture and tone Toenails 1-5 b/l are thickened, elongated, dystrophic with subungual debris  Musculoskeletal: Guarding noted left foot/ankle due to pain. +Pain on anterior and lateral ankle joint. Muscle strength 5/5 to all LE muscle groups  Neurological: Sensation intact with 10 gram monofilament. Vibratory sensation intact.  Xray left foot/ankle: No evidence of fracture noted.  Assessment: 1. Ankle sprain left LE 2. Painful onychomycosis 1-5 b/l.  Plan: 1. Xray taken of the left foot/ankle Discussed ankle sprain.  Patient given written instructions on PRICE: Protect the ankle with ankle brace dispensed today. Rest the ankle. Ice the ankle with dispensed ice pack 15-20 minutes twice a day. Elevate the extremity when at rest. Take over-the-counter and prescription medicines only as told by your doctor. For 2-3 days, keep your ankle higher than the level of your heart (elevated) as much as possible. 2. Patient to continue soft, supportive shoe gear 3. Patient to report any pedal injuries to medical professional immediately. 4. Follow up 3 weeks. Patient/POA to call should there be a concern in the interim.

## 2018-04-05 ENCOUNTER — Ambulatory Visit: Payer: Medicare Other | Admitting: Gynecology

## 2018-04-08 ENCOUNTER — Other Ambulatory Visit: Payer: Self-pay | Admitting: Nurse Practitioner

## 2018-04-10 ENCOUNTER — Other Ambulatory Visit: Payer: Self-pay | Admitting: Family Medicine

## 2018-04-10 ENCOUNTER — Ambulatory Visit
Admission: RE | Admit: 2018-04-10 | Discharge: 2018-04-10 | Disposition: A | Discharge status: Home / Self Care | Payer: Medicare Other | Source: Ambulatory Visit | Attending: Family Medicine | Admitting: Family Medicine

## 2018-04-10 DIAGNOSIS — R52 Pain, unspecified: Secondary | ICD-10-CM

## 2018-05-15 ENCOUNTER — Inpatient Hospital Stay: Payer: Medicare Other | Attending: Hematology and Oncology

## 2018-05-15 DIAGNOSIS — D509 Iron deficiency anemia, unspecified: Secondary | ICD-10-CM | POA: Insufficient documentation

## 2018-05-15 DIAGNOSIS — C50411 Malignant neoplasm of upper-outer quadrant of right female breast: Secondary | ICD-10-CM | POA: Diagnosis not present

## 2018-05-15 DIAGNOSIS — D508 Other iron deficiency anemias: Secondary | ICD-10-CM

## 2018-05-15 LAB — CBC WITH DIFFERENTIAL (CANCER CENTER ONLY)
Abs Immature Granulocytes: 0.02 10*3/uL (ref 0.00–0.07)
BASOS PCT: 0 %
Basophils Absolute: 0 10*3/uL (ref 0.0–0.1)
EOS ABS: 0.6 10*3/uL — AB (ref 0.0–0.5)
Eosinophils Relative: 7 %
HCT: 37.9 % (ref 36.0–46.0)
Hemoglobin: 11.4 g/dL — ABNORMAL LOW (ref 12.0–15.0)
Immature Granulocytes: 0 %
Lymphocytes Relative: 19 %
Lymphs Abs: 1.5 10*3/uL (ref 0.7–4.0)
MCH: 27.1 pg (ref 26.0–34.0)
MCHC: 30.1 g/dL (ref 30.0–36.0)
MCV: 90.2 fL (ref 80.0–100.0)
MONO ABS: 0.6 10*3/uL (ref 0.1–1.0)
MONOS PCT: 7 %
NEUTROS ABS: 5.3 10*3/uL (ref 1.7–7.7)
NEUTROS PCT: 67 %
PLATELETS: 277 10*3/uL (ref 150–400)
RBC: 4.2 MIL/uL (ref 3.87–5.11)
RDW: 13.8 % (ref 11.5–15.5)
WBC Count: 8 10*3/uL (ref 4.0–10.5)
nRBC: 0 % (ref 0.0–0.2)

## 2018-05-15 LAB — FERRITIN: Ferritin: 36 ng/mL (ref 11–307)

## 2018-05-15 LAB — IRON AND TIBC
IRON: 35 ug/dL — AB (ref 41–142)
Saturation Ratios: 9 % — ABNORMAL LOW (ref 21–57)
TIBC: 396 ug/dL (ref 236–444)
UIBC: 362 ug/dL (ref 120–384)

## 2018-05-17 ENCOUNTER — Ambulatory Visit: Payer: Medicare Other | Admitting: Gynecology

## 2018-05-17 ENCOUNTER — Encounter: Payer: Self-pay | Admitting: Gynecology

## 2018-05-17 VITALS — BP 124/82 | Ht 59.0 in | Wt 179.0 lb

## 2018-05-17 DIAGNOSIS — N952 Postmenopausal atrophic vaginitis: Secondary | ICD-10-CM | POA: Diagnosis not present

## 2018-05-17 DIAGNOSIS — Z853 Personal history of malignant neoplasm of breast: Secondary | ICD-10-CM

## 2018-05-17 DIAGNOSIS — R21 Rash and other nonspecific skin eruption: Secondary | ICD-10-CM | POA: Diagnosis not present

## 2018-05-17 DIAGNOSIS — M8580 Other specified disorders of bone density and structure, unspecified site: Secondary | ICD-10-CM

## 2018-05-17 DIAGNOSIS — Z01419 Encounter for gynecological examination (general) (routine) without abnormal findings: Secondary | ICD-10-CM

## 2018-05-17 DIAGNOSIS — Z9289 Personal history of other medical treatment: Secondary | ICD-10-CM | POA: Diagnosis not present

## 2018-05-17 MED ORDER — NYSTATIN 100000 UNIT/GM EX CREA: 1.0000 "application " | TOPICAL_CREAM | Freq: Two times a day (BID) | CUTANEOUS | 4 refills | Status: DC

## 2018-05-17 NOTE — Progress Notes (Signed)
    LAINY WROBLESKI [birth year] [id number]        73 y.o.  G1P1 for annual gynecologic exam.  Has not had a gynecologic exam in a number of years.  Without gynecologic complaints.  Past medical history,surgical history, problem list, medications, allergies, family history and social history were all reviewed and documented as reviewed in the EPIC chart.  ROS:  Performed with pertinent positives and negatives included in the history, assessment and plan.   Additional significant findings : None   Exam: Caryn Bee assistant Vitals:   05/17/18 0932  BP: 124/82  Weight: 179 lb (81.2 kg)  Height: 4\' 11"  (1.499 m)   Body mass index is 36.15 kg/m.  General appearance:  Normal affect, orientation and appearance. Skin: Grossly normal excepting scab-like lesions over back chest and legs HEENT: Without gross lesions.  No cervical or supraclavicular adenopathy. Thyroid normal.  Lungs:  Clear without wheezing, rales or rhonchi Cardiac: RR, without RMG Abdominal:  Soft, nontender, without masses, guarding, rebound, organomegaly or hernia Breasts:  Examined lying and sitting without masses, retractions, discharge or axillary adenopathy.  Status post right lumpectomy. Pelvic:  Ext, BUS, Vagina: With atrophic changes.  Classic fungal infection in creases of her groin and panniculus  Cervix: Difficult to visualize flush with the upper vagina.  Pap smear done  Uterus: Unable to palpate.  No gross masses or tenderness   Adnexa: Without masses or tenderness    Anus and perineum: Normal   Rectovaginal: Normal sphincter tone without palpated masses or tenderness.    Assessment/Plan:  73 y.o. G1P1 female for annual gynecologic exam.   1. Postmenopausal.  No significant menopausal symptoms or any vaginal bleeding. 2. Rash in her groin consistent with fungal.  Nystatin cream prescription provided to use as needed. 3. History of right breast cancer.  Exam NED.  Mammography 07/2017.  Actively followed  by oncology 4. Scab-like lesions.  Patient notes that been there for "some time".  Recommended she follow-up with dermatology and she agrees to arrange. 5. Colonoscopy 2018.  Repeat at their recommended interval. 6. Pap smear 2010.  Pap smear done today.  No history of significant abnormal Pap smears. 7. DEXA 2002 T score -1.2.  Recommended follow-up DEXA now and she will schedule. 8. Health maintenance.  No routine lab work done as patient does this elsewhere.  Follow-up 1 year, sooner as needed.   Anastasio Auerbach MD, 10:00 AM 05/17/2018

## 2018-05-17 NOTE — Progress Notes (Signed)
Patient Care Team: Gaynelle Arabian, MD as PCP - General (Family Medicine) Jerline Pain, MD as PCP - Cardiology (Cardiology) Jerline Pain, MD as Consulting Physician (Cardiology) Wonda Horner, MD as Consulting Physician (Gastroenterology) Ralene Ok, MD as Consulting Physician (Surgery) Nicholas Lose, MD as Consulting Physician (Hematology and Oncology) Sylvan Cheese, NP as Nurse Practitioner (Hematology and Oncology) Jovita Kussmaul, MD as Consulting Physician (General Surgery) Thea Silversmith, MD as Consulting Physician (Radiation Oncology)  DIAGNOSIS:    ICD-10-CM   1. Malignant neoplasm of upper-outer quadrant of right breast in female, estrogen receptor negative ([place]) C50.411    Z17.1   2. Iron deficiency anemia, unspecified iron deficiency anemia type D50.9     SUMMARY OF ONCOLOGIC HISTORY:   Breast cancer of upper-outer quadrant of right female breast ([place])   07/30/2014 Mammogram    Right breast: Mass at 10 o'clock location 4 cm from the nipple measuring 1.7 x 1.3 x 1.3 cm. This corresponds to the palpable and mammographic finding. In the right axilla, there are several lymph nodes which are normal in size measuring up to 62m.    08/27/2014 Initial Biopsy    Right breast core needle bx: Invasive ductal carcinoma with LVI, grade 3, ER- (0%), PR- (0%), HER2/neu negative, Ki67 30%    08/27/2014 Clinical Stage    Stage IIA: T2 N0    09/22/2014 Definitive Surgery    Right lumpectomy/SLNB (Marlou Starks: Invasive ductal carcinoma, grade 3, 2.6 cm, high grade DCIS, LVI present, ER-, PR-, 2 LN removed and 1 positive for metastatic carcinoma (1/2)    09/22/2014 Pathologic Stage    Stage IIB: T2 N1 M0    10/15/2014 - 12/16/2014 Chemotherapy    Adjuvant chemotherapy with Taxotere Cytoxan 4 q3 weeks    10/21/2014 - 10/24/2014 Hospital Admission    Admission for neutropenic fever UTI Klebsiella    01/26/2015 - 03/11/2015 Radiation Therapy    Adjuvant RT (Pablo Ledger: Right  breast / 45 Gray @ 1.8 GPearline Cablesper fraction x 25 fractions Right supraclavicular fossa/PAB 45 Gy '@1' .8 Gy per fraction x 25 fractions Right breast boost / 16 Gray at 2[employer] Corporationper fraction x 8 fractions    05/19/2015 Survivorship    Survivorship care plan visit completed.    03/28/2018 Surgery    Laparoscopic cholecystectomy     CHIEF COMPLIANT: Follow-up of breast cancer and iron deficiency anemia  INTERVAL HISTORY: Angela SPERAis a 73y.o. with above-mentioned history of right breast cancer treated with lumpectomy and adjuvant chemotherapy and radiation and is currently in surveillance. She was diagnosed with iron deficiency anemia and is here for blood work and follow-up.  She denies any lumps or nodules in the breast. She is going through lots of financial difficulties and gets food from the food bank.   REVIEW OF SYSTEMS:   Constitutional: Denies fevers, chills or abnormal weight loss Eyes: Denies blurriness of vision Ears, nose, mouth, throat, and face: Denies mucositis or sore throat Respiratory: Denies cough, dyspnea or wheezes Cardiovascular: Denies palpitation, chest discomfort Gastrointestinal:  Denies nausea, heartburn or change in bowel habits Skin: Denies abnormal skin rashes Lymphatics: Denies new lymphadenopathy or easy bruising Neurological:Denies numbness, tingling or new weaknesses Behavioral/Psych: Mood is stable, no new changes  Extremities: No lower extremity edema Breast:  denies any pain or lumps or nodules in either breasts All other systems were reviewed with the patient and are negative.  I have reviewed the past medical history, past surgical history, social history  and family history with the patient and they are unchanged from previous note.  ALLERGIES:  is allergic to shellfish allergy; aspirin; penicillins; aleve [naproxen]; brilinta [ticagrelor]; ciprofloxacin; doxycycline; evista [raloxifene]; fosamax [alendronate sodium]; lactose intolerance (gi);  latex; naproxen sodium; nitrofurantoin; pravachol [pravastatin]; sulfa antibiotics; tape; welchol [colesevelam hcl]; and zocor [simvastatin].  MEDICATIONS:  Current Outpatient Medications  Medication Sig Dispense Refill  . acetaminophen (TYLENOL) 500 MG tablet Take 1,000 mg by mouth every 6 (six) hours as needed for moderate pain or headache.    Marland Kitchen aspirin EC 81 MG EC tablet Take 1 tablet (81 mg total) by mouth daily.    . carboxymethylcellulose (REFRESH TEARS) 0.5 % SOLN Place 1 drop into both eyes 4 (four) times daily.    . Cholecalciferol (VITAMIN D) 2000 UNITS tablet Take 2,000 Units by mouth 2 (two) times daily.     . clopidogrel (PLAVIX) 75 MG tablet TAKE 1 TABLET BY MOUTH  DAILY (Patient taking differently: Take 75 mg by mouth daily. ) 90 tablet 1  . dexlansoprazole (DEXILANT) 60 MG capsule Take 60 mg by mouth daily.    . diphenhydrAMINE-zinc acetate (BENADRYL) cream Apply 1 application topically at bedtime as needed for itching.    . escitalopram (LEXAPRO) 20 MG tablet Take 20 mg by mouth daily.    Marland Kitchen ezetimibe (ZETIA) 10 MG tablet Take 1 tablet (10 mg total) by mouth at bedtime. 90 tablet 2  . fexofenadine (ALLEGRA) 180 MG tablet Take 180 mg by mouth daily as needed for allergies.     . furosemide (LASIX) 20 MG tablet Take 1 tablet (20 mg total) by mouth every other day. 30 tablet 3  . gabapentin (NEURONTIN) 600 MG tablet Take 1 tablet (600 mg total) by mouth 3 (three) times daily. (Patient taking differently: Take 600 mg by mouth 2 (two) times daily. ) 90 tablet 3  . LORazepam (ATIVAN) 1 MG tablet Take 1 mg by mouth at bedtime.     . lovastatin (MEVACOR) 40 MG tablet Take 40 mg by mouth at bedtime.    . Magnesium 500 MG TABS Take 500 mg by mouth at bedtime.    . meclizine (ANTIVERT) 25 MG tablet Take 12.5-25 mg by mouth 3 (three) times daily as needed for dizziness.    . nitroGLYCERIN (NITROSTAT) 0.4 MG SL tablet Place 1 tablet (0.4 mg total) under the tongue every 5 (five) minutes as  needed for chest pain (CP or SOB). 25 tablet 3  . nystatin cream (MYCOSTATIN) Apply 1 application topically 2 (two) times daily. 30 g 4  . ranitidine (ZANTAC) 150 MG tablet Take 150 mg by mouth at bedtime.    . sodium chloride (OCEAN) 0.65 % SOLN nasal spray Place 2 sprays into both nostrils as needed for congestion.    . vitamin C (ASCORBIC ACID) 500 MG tablet Take 500 mg by mouth daily.     No current facility-administered medications for this visit.     PHYSICAL EXAMINATION: ECOG PERFORMANCE STATUS: 1 - Symptomatic but completely ambulatory  Vitals:   05/18/18 1057  BP: (!) 144/82  Pulse: 75  Resp: 16  Temp: 98.7 F (37.1 C)  SpO2: 100%   Filed Weights   05/18/18 1057  Weight: 180 lb 4.8 oz (81.8 kg)    GENERAL:alert, no distress and comfortable SKIN: skin color, texture, turgor are normal, no rashes or significant lesions EYES: normal, Conjunctiva are pink and non-injected, sclera clear OROPHARYNX:no exudate, no erythema and lips, buccal mucosa, and tongue normal  NECK:  supple, thyroid normal size, non-tender, without nodularity LYMPH:  no palpable lymphadenopathy in the cervical, axillary or inguinal LUNGS: clear to auscultation and percussion with normal breathing effort HEART: regular rate & rhythm and no murmurs and no lower extremity edema ABDOMEN:abdomen soft, non-tender and normal bowel sounds MUSCULOSKELETAL:no cyanosis of digits and no clubbing  NEURO: alert & oriented x 3 with fluent speech, no focal motor/sensory deficits EXTREMITIES: No lower extremity edema BREAST: No palpable masses or nodules in either right or left breasts. No palpable axillary supraclavicular or infraclavicular adenopathy no breast tenderness or nipple discharge. (exam performed in the presence of a chaperone)  LABORATORY DATA:  I have reviewed the data as listed CMP Latest Ref Rng & Units 03/20/2018 11/08/2017 08/01/2017  Glucose 70 - 99 mg/dL 90 80 -  BUN 8 - 23 mg/dL 9 15 -    Creatinine 0.44 - 1.00 mg/dL 0.87 0.80 0.89  Sodium 135 - 145 mmol/L 139 144 -  Potassium 3.5 - 5.1 mmol/L 3.9 3.9 -  Chloride 98 - 111 mmol/L 102 104 -  CO2 22 - 32 mmol/L 25 21 -  Calcium 8.9 - 10.3 mg/dL 9.2 9.0 -  Total Protein 6.5 - 8.1 g/dL 7.8 - -  Total Bilirubin 0.3 - 1.2 mg/dL 0.7 - -  Alkaline Phos 38 - 126 U/L 59 - -  AST 15 - 41 U/L 21 - -  ALT 0 - 44 U/L 13 - -    Lab Results  Component Value Date   WBC 8.0 05/15/2018   HGB 11.4 (L) 05/15/2018   HCT 37.9 05/15/2018   MCV 90.2 05/15/2018   PLT 277 05/15/2018   NEUTROABS 5.3 05/15/2018    ASSESSMENT & PLAN:  Breast cancer of upper-outer quadrant of right female breast ([place]) Diagnosed 2016 treated with lumpectomy, Taxotere/Cytoxan as well as radiation therapy, triple negative  Breast cancer surveillance: 1.  Breast exam 05/18/2018: Benign 2.  Mammogram and ultrasound 08/08/2017: 4.7 cm area of hemorrhage and fat necrosis right breast corresponding to the elongated palpable mass with bruising (as a result of fall).  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 Patient received blood transfusion at [facility].  IV Iron given on 02/17/2017, 11/22/2017  Lab review 05/15/2018: Hemoglobin 11.4, ferritin 36, iron saturation 9% Based on the 9% iron saturation, and her ongoing symptoms, I recommended IV iron therapy  Return to clinic in 6 months with labs and follow-up  No orders of the defined types were placed in this encounter.  The patient has a good understanding of the overall plan. she agrees with it. she will call with any problems that may develop before the next visit here.  Nicholas Lose, MD 05/18/2018   I, Molly Dorshimer, am acting as scribe for Nicholas Lose, MD.  I have reviewed the above documentation for  accuracy and completeness, and I agree with the above.

## 2018-05-17 NOTE — Patient Instructions (Signed)
Apply the prescribed cream to the rash in your groin at bedtime as needed.  Follow-up for the bone density as scheduled.

## 2018-05-17 NOTE — Addendum Note (Signed)
Addended by: Nelva Nay on: 05/17/2018 11:04 AM   Modules accepted: Orders

## 2018-05-18 ENCOUNTER — Inpatient Hospital Stay: Payer: Medicare Other | Attending: Hematology and Oncology | Admitting: Hematology and Oncology

## 2018-05-18 DIAGNOSIS — Z853 Personal history of malignant neoplasm of breast: Secondary | ICD-10-CM | POA: Insufficient documentation

## 2018-05-18 DIAGNOSIS — D509 Iron deficiency anemia, unspecified: Secondary | ICD-10-CM | POA: Diagnosis not present

## 2018-05-18 DIAGNOSIS — C50411 Malignant neoplasm of upper-outer quadrant of right female breast: Secondary | ICD-10-CM

## 2018-05-18 DIAGNOSIS — Z171 Estrogen receptor negative status [ER-]: Secondary | ICD-10-CM

## 2018-05-18 LAB — PAP IG W/ RFLX HPV ASCU

## 2018-05-18 NOTE — Assessment & Plan Note (Signed)
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 Patient received blood transfusion at [facility].  IV Iron given on 02/17/2017, 11/22/2017  Lab review 05/15/2018: Hemoglobin 11.4, ferritin 36, iron saturation 9% Based on the 9% iron saturation, and her ongoing symptoms, I recommended IV iron therapy

## 2018-05-18 NOTE — Assessment & Plan Note (Signed)
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  Breast exam 05/18/2018: Benign 2.  Mammogram and ultrasound 08/08/2017: 4.7 cm area of hemorrhage and fat necrosis right breast corresponding to the elongated palpable mass with bruising (as a result of fall).  3 small areas of residual fat necrosis left breast  Return to clinic in 1 year for follow-up

## 2018-05-22 ENCOUNTER — Other Ambulatory Visit: Payer: Self-pay | Admitting: Hematology and Oncology

## 2018-05-22 ENCOUNTER — Encounter: Payer: Self-pay | Admitting: Hematology and Oncology

## 2018-05-24 ENCOUNTER — Inpatient Hospital Stay: Payer: Medicare Other

## 2018-05-24 VITALS — BP 131/74 | HR 74 | Temp 98.7°F | Resp 18

## 2018-05-24 DIAGNOSIS — Z853 Personal history of malignant neoplasm of breast: Secondary | ICD-10-CM | POA: Diagnosis present

## 2018-05-24 DIAGNOSIS — D508 Other iron deficiency anemias: Secondary | ICD-10-CM

## 2018-05-24 DIAGNOSIS — D509 Iron deficiency anemia, unspecified: Secondary | ICD-10-CM | POA: Diagnosis not present

## 2018-05-24 MED ORDER — SODIUM CHLORIDE 0.9 % IV SOLN
Freq: Once | INTRAVENOUS | Status: AC
  Administered 2018-05-24: 10:00:00 via INTRAVENOUS
  Filled 2018-05-24: qty 250

## 2018-05-24 MED ORDER — SODIUM CHLORIDE 0.9 % IV SOLN
510.0000 mg | Freq: Once | INTRAVENOUS | Status: AC
  Administered 2018-05-24: 510 mg via INTRAVENOUS
  Filled 2018-05-24: qty 17

## 2018-05-24 NOTE — Patient Instructions (Signed)

## 2018-05-31 ENCOUNTER — Inpatient Hospital Stay: Payer: Medicare Other

## 2018-05-31 VITALS — BP 118/63 | HR 66 | Temp 98.4°F | Resp 18

## 2018-05-31 DIAGNOSIS — D509 Iron deficiency anemia, unspecified: Secondary | ICD-10-CM | POA: Diagnosis not present

## 2018-05-31 DIAGNOSIS — D508 Other iron deficiency anemias: Secondary | ICD-10-CM

## 2018-05-31 MED ORDER — SODIUM CHLORIDE 0.9 % IV SOLN
510.0000 mg | Freq: Once | INTRAVENOUS | Status: AC
  Administered 2018-05-31: 510 mg via INTRAVENOUS
  Filled 2018-05-31: qty 17

## 2018-05-31 MED ORDER — SODIUM CHLORIDE 0.9 % IV SOLN
Freq: Once | INTRAVENOUS | Status: AC
  Administered 2018-05-31: 10:00:00 via INTRAVENOUS
  Filled 2018-05-31: qty 250

## 2018-05-31 NOTE — Patient Instructions (Signed)

## 2018-06-29 ENCOUNTER — Other Ambulatory Visit: Payer: Self-pay | Admitting: Cardiology

## 2018-08-15 ENCOUNTER — Encounter (HOSPITAL_COMMUNITY): Payer: Self-pay | Admitting: Emergency Medicine

## 2018-08-15 ENCOUNTER — Inpatient Hospital Stay (HOSPITAL_COMMUNITY)
Admission: EM | Admit: 2018-08-15 | Discharge: 2018-08-18 | DRG: 641 | Disposition: A | Discharge status: Home / Self Care | Payer: Medicare Other | Attending: Internal Medicine | Admitting: Internal Medicine

## 2018-08-15 ENCOUNTER — Emergency Department (HOSPITAL_COMMUNITY): Payer: Medicare Other

## 2018-08-15 ENCOUNTER — Other Ambulatory Visit: Payer: Self-pay

## 2018-08-15 DIAGNOSIS — Z79899 Other long term (current) drug therapy: Secondary | ICD-10-CM

## 2018-08-15 DIAGNOSIS — Z96653 Presence of artificial knee joint, bilateral: Secondary | ICD-10-CM | POA: Diagnosis present

## 2018-08-15 DIAGNOSIS — Z880 Allergy status to penicillin: Secondary | ICD-10-CM

## 2018-08-15 DIAGNOSIS — Z886 Allergy status to analgesic agent status: Secondary | ICD-10-CM | POA: Diagnosis not present

## 2018-08-15 DIAGNOSIS — Z955 Presence of coronary angioplasty implant and graft: Secondary | ICD-10-CM

## 2018-08-15 DIAGNOSIS — Z7902 Long term (current) use of antithrombotics/antiplatelets: Secondary | ICD-10-CM | POA: Diagnosis not present

## 2018-08-15 DIAGNOSIS — I2510 Atherosclerotic heart disease of native coronary artery without angina pectoris: Secondary | ICD-10-CM | POA: Diagnosis present

## 2018-08-15 DIAGNOSIS — K219 Gastro-esophageal reflux disease without esophagitis: Secondary | ICD-10-CM | POA: Diagnosis present

## 2018-08-15 DIAGNOSIS — Z923 Personal history of irradiation: Secondary | ICD-10-CM

## 2018-08-15 DIAGNOSIS — Z9221 Personal history of antineoplastic chemotherapy: Secondary | ICD-10-CM

## 2018-08-15 DIAGNOSIS — R8271 Bacteriuria: Secondary | ICD-10-CM | POA: Diagnosis present

## 2018-08-15 DIAGNOSIS — E7800 Pure hypercholesterolemia, unspecified: Secondary | ICD-10-CM | POA: Diagnosis present

## 2018-08-15 DIAGNOSIS — F329 Major depressive disorder, single episode, unspecified: Secondary | ICD-10-CM | POA: Diagnosis present

## 2018-08-15 DIAGNOSIS — Z91013 Allergy to seafood: Secondary | ICD-10-CM

## 2018-08-15 DIAGNOSIS — R6889 Other general symptoms and signs: Secondary | ICD-10-CM

## 2018-08-15 DIAGNOSIS — Z7982 Long term (current) use of aspirin: Secondary | ICD-10-CM

## 2018-08-15 DIAGNOSIS — Z20822 Contact with and (suspected) exposure to covid-19: Secondary | ICD-10-CM | POA: Diagnosis present

## 2018-08-15 DIAGNOSIS — Z91011 Allergy to milk products: Secondary | ICD-10-CM

## 2018-08-15 DIAGNOSIS — Z882 Allergy status to sulfonamides status: Secondary | ICD-10-CM

## 2018-08-15 DIAGNOSIS — I10 Essential (primary) hypertension: Secondary | ICD-10-CM | POA: Diagnosis present

## 2018-08-15 DIAGNOSIS — I252 Old myocardial infarction: Secondary | ICD-10-CM

## 2018-08-15 DIAGNOSIS — F419 Anxiety disorder, unspecified: Secondary | ICD-10-CM | POA: Diagnosis present

## 2018-08-15 DIAGNOSIS — Z91040 Latex allergy status: Secondary | ICD-10-CM | POA: Diagnosis not present

## 2018-08-15 DIAGNOSIS — Z853 Personal history of malignant neoplasm of breast: Secondary | ICD-10-CM

## 2018-08-15 DIAGNOSIS — Z8249 Family history of ischemic heart disease and other diseases of the circulatory system: Secondary | ICD-10-CM | POA: Diagnosis not present

## 2018-08-15 DIAGNOSIS — B9620 Unspecified Escherichia coli [E. coli] as the cause of diseases classified elsewhere: Secondary | ICD-10-CM | POA: Diagnosis present

## 2018-08-15 DIAGNOSIS — Z20828 Contact with and (suspected) exposure to other viral communicable diseases: Secondary | ICD-10-CM

## 2018-08-15 DIAGNOSIS — E872 Acidosis: Secondary | ICD-10-CM | POA: Diagnosis present

## 2018-08-15 DIAGNOSIS — R509 Fever, unspecified: Secondary | ICD-10-CM | POA: Diagnosis not present

## 2018-08-15 DIAGNOSIS — G629 Polyneuropathy, unspecified: Secondary | ICD-10-CM | POA: Diagnosis present

## 2018-08-15 DIAGNOSIS — I951 Orthostatic hypotension: Secondary | ICD-10-CM

## 2018-08-15 DIAGNOSIS — E876 Hypokalemia: Secondary | ICD-10-CM | POA: Diagnosis present

## 2018-08-15 DIAGNOSIS — Z809 Family history of malignant neoplasm, unspecified: Secondary | ICD-10-CM | POA: Diagnosis not present

## 2018-08-15 LAB — CBC WITH DIFFERENTIAL/PLATELET
Abs Immature Granulocytes: 0.03 10*3/uL (ref 0.00–0.07)
Basophils Absolute: 0 10*3/uL (ref 0.0–0.1)
Basophils Relative: 0 %
Eosinophils Absolute: 0.1 10*3/uL (ref 0.0–0.5)
Eosinophils Relative: 1 %
HCT: 36.3 % (ref 36.0–46.0)
Hemoglobin: 11.9 g/dL — ABNORMAL LOW (ref 12.0–15.0)
Immature Granulocytes: 0 %
Lymphocytes Relative: 10 %
Lymphs Abs: 0.8 10*3/uL (ref 0.7–4.0)
MCH: 30.5 pg (ref 26.0–34.0)
MCHC: 32.8 g/dL (ref 30.0–36.0)
MCV: 93.1 fL (ref 80.0–100.0)
Monocytes Absolute: 0.8 10*3/uL (ref 0.1–1.0)
Monocytes Relative: 10 %
Neutro Abs: 6.3 10*3/uL (ref 1.7–7.7)
Neutrophils Relative %: 79 %
Platelets: 186 10*3/uL (ref 150–400)
RBC: 3.9 MIL/uL (ref 3.87–5.11)
RDW: 15 % (ref 11.5–15.5)
WBC: 8 10*3/uL (ref 4.0–10.5)
nRBC: 0 % (ref 0.0–0.2)

## 2018-08-15 LAB — RESPIRATORY PANEL BY PCR

## 2018-08-15 LAB — COMPREHENSIVE METABOLIC PANEL
ALT: 11 U/L (ref 0–44)
AST: 16 U/L (ref 15–41)
Albumin: 2.5 g/dL — ABNORMAL LOW (ref 3.5–5.0)
Alkaline Phosphatase: 72 U/L (ref 38–126)
Anion gap: 13 (ref 5–15)
BUN: 6 mg/dL — ABNORMAL LOW (ref 8–23)
CO2: 20 mmol/L — ABNORMAL LOW (ref 22–32)
Calcium: 6.6 mg/dL — ABNORMAL LOW (ref 8.9–10.3)
Chloride: 109 mmol/L (ref 98–111)
Creatinine, Ser: 0.79 mg/dL (ref 0.44–1.00)
GFR calc Af Amer: >60 mL/min (ref >60)
GFR calc non Af Amer: >60 mL/min (ref >60)
Glucose, Bld: 84 mg/dL (ref 70–99)
Potassium: 2.1 mmol/L — CL (ref 3.5–5.1)
Sodium: 142 mmol/L (ref 135–145)
Total Bilirubin: 0.7 mg/dL (ref 0.3–1.2)
Total Protein: 5.8 g/dL — ABNORMAL LOW (ref 6.5–8.1)

## 2018-08-15 LAB — INFLUENZA PANEL BY PCR (TYPE A & B)
Influenza A By PCR: NEGATIVE
Influenza B By PCR: NEGATIVE

## 2018-08-15 LAB — URINALYSIS, ROUTINE W REFLEX MICROSCOPIC
Bilirubin Urine: NEGATIVE
Glucose, UA: NEGATIVE mg/dL
Ketones, ur: NEGATIVE mg/dL
Leukocytes,Ua: NEGATIVE
Nitrite: NEGATIVE
Protein, ur: NEGATIVE mg/dL
Specific Gravity, Urine: 1.009 (ref 1.005–1.030)
pH: 7 (ref 5.0–8.0)

## 2018-08-15 LAB — MAGNESIUM: Magnesium: 1.3 mg/dL — ABNORMAL LOW (ref 1.7–2.4)

## 2018-08-15 LAB — TROPONIN I: Troponin I: <0.03 ng/mL (ref <0.03)

## 2018-08-15 LAB — LACTIC ACID, PLASMA
Lactic Acid, Venous: 2.4 mmol/L (ref 0.5–1.9)
Lactic Acid, Venous: 1 mmol/L (ref 0.5–1.9)

## 2018-08-15 MED ORDER — SODIUM CHLORIDE 0.9 % IV BOLUS
1000.0000 mL | Freq: Once | INTRAVENOUS | Status: AC
  Administered 2018-08-15: 1000 mL via INTRAVENOUS

## 2018-08-15 MED ORDER — GABAPENTIN 300 MG PO CAPS
600.0000 mg | ORAL_CAPSULE | Freq: Two times a day (BID) | ORAL | Status: DC
  Administered 2018-08-15 – 2018-08-18 (×6): 600 mg via ORAL
  Filled 2018-08-15 (×6): qty 2

## 2018-08-15 MED ORDER — DOCUSATE SODIUM 100 MG PO CAPS
100.0000 mg | ORAL_CAPSULE | Freq: Two times a day (BID) | ORAL | Status: DC
  Administered 2018-08-15 – 2018-08-18 (×6): 100 mg via ORAL
  Filled 2018-08-15 (×6): qty 1

## 2018-08-15 MED ORDER — NON FORMULARY: 60.0000 mg | Freq: Every day | Status: DC

## 2018-08-15 MED ORDER — ACETAMINOPHEN 325 MG PO TABS
650.0000 mg | ORAL_TABLET | Freq: Four times a day (QID) | ORAL | Status: DC | PRN
  Administered 2018-08-16 – 2018-08-18 (×3): 650 mg via ORAL
  Filled 2018-08-15 (×3): qty 2

## 2018-08-15 MED ORDER — ESCITALOPRAM OXALATE 20 MG PO TABS
20.0000 mg | ORAL_TABLET | Freq: Every day | ORAL | Status: DC
  Administered 2018-08-16 – 2018-08-18 (×3): 20 mg via ORAL
  Filled 2018-08-15 (×3): qty 1

## 2018-08-15 MED ORDER — NITROGLYCERIN 0.4 MG SL SUBL: 0.4000 mg | SUBLINGUAL_TABLET | SUBLINGUAL | Status: DC | PRN

## 2018-08-15 MED ORDER — ATORVASTATIN CALCIUM 10 MG PO TABS: 20.0000 mg | ORAL_TABLET | Freq: Every day | ORAL | Status: DC

## 2018-08-15 MED ORDER — ACETAMINOPHEN 650 MG RE SUPP: 650.0000 mg | Freq: Four times a day (QID) | RECTAL | Status: DC | PRN

## 2018-08-15 MED ORDER — POTASSIUM CHLORIDE 10 MEQ/100ML IV SOLN
10.0000 meq | INTRAVENOUS | Status: DC
  Administered 2018-08-15 (×2): 10 meq via INTRAVENOUS
  Filled 2018-08-15 (×2): qty 100

## 2018-08-15 MED ORDER — ENOXAPARIN SODIUM 40 MG/0.4ML ~~LOC~~ SOLN
40.0000 mg | Freq: Every day | SUBCUTANEOUS | Status: DC
  Administered 2018-08-15 – 2018-08-17 (×3): 40 mg via SUBCUTANEOUS
  Filled 2018-08-15 (×3): qty 0.4

## 2018-08-15 MED ORDER — SODIUM CHLORIDE 0.9 % IV SOLN
1.0000 g | INTRAVENOUS | Status: DC
  Administered 2018-08-15: 20:00:00 1 g via INTRAVENOUS
  Filled 2018-08-15 (×2): qty 10

## 2018-08-15 MED ORDER — LACTATED RINGERS IV SOLN
INTRAVENOUS | Status: DC
  Administered 2018-08-15 – 2018-08-16 (×2): via INTRAVENOUS

## 2018-08-15 MED ORDER — ACETAMINOPHEN 500 MG PO TABS: 1000.0000 mg | ORAL_TABLET | Freq: Four times a day (QID) | ORAL | Status: DC | PRN

## 2018-08-15 MED ORDER — POTASSIUM CHLORIDE CRYS ER 20 MEQ PO TBCR
40.0000 meq | EXTENDED_RELEASE_TABLET | Freq: Once | ORAL | Status: AC
  Administered 2018-08-15: 40 meq via ORAL
  Filled 2018-08-15: qty 2

## 2018-08-15 MED ORDER — ACETAMINOPHEN 500 MG PO TABS
1000.0000 mg | ORAL_TABLET | Freq: Once | ORAL | Status: AC
  Administered 2018-08-15: 1000 mg via ORAL
  Filled 2018-08-15: qty 2

## 2018-08-15 MED ORDER — EZETIMIBE 10 MG PO TABS
10.0000 mg | ORAL_TABLET | Freq: Every day | ORAL | Status: DC
  Administered 2018-08-15 – 2018-08-17 (×3): 10 mg via ORAL
  Filled 2018-08-15 (×3): qty 1

## 2018-08-15 MED ORDER — FAMOTIDINE 20 MG PO TABS
20.0000 mg | ORAL_TABLET | Freq: Every day | ORAL | Status: DC
  Administered 2018-08-15 – 2018-08-17 (×3): 20 mg via ORAL
  Filled 2018-08-15 (×3): qty 1

## 2018-08-15 MED ORDER — POLYVINYL ALCOHOL 1.4 % OP SOLN
1.0000 [drp] | Freq: Four times a day (QID) | OPHTHALMIC | Status: DC
  Administered 2018-08-15 – 2018-08-18 (×9): 1 [drp] via OPHTHALMIC
  Filled 2018-08-15: qty 15

## 2018-08-15 MED ORDER — MAGNESIUM OXIDE 400 (241.3 MG) MG PO TABS
800.0000 mg | ORAL_TABLET | Freq: Two times a day (BID) | ORAL | Status: DC
  Administered 2018-08-15 – 2018-08-16 (×3): 800 mg via ORAL
  Filled 2018-08-15 (×4): qty 2

## 2018-08-15 MED ORDER — CLOPIDOGREL BISULFATE 75 MG PO TABS
75.0000 mg | ORAL_TABLET | Freq: Every day | ORAL | Status: DC
  Administered 2018-08-16 – 2018-08-18 (×3): 75 mg via ORAL
  Filled 2018-08-15 (×3): qty 1

## 2018-08-15 MED ORDER — POTASSIUM CHLORIDE CRYS ER 20 MEQ PO TBCR
40.0000 meq | EXTENDED_RELEASE_TABLET | Freq: Three times a day (TID) | ORAL | Status: DC
  Administered 2018-08-15 – 2018-08-16 (×4): 40 meq via ORAL
  Filled 2018-08-15 (×4): qty 2

## 2018-08-15 MED ORDER — ONDANSETRON HCL 4 MG PO TABS: 4.0000 mg | ORAL_TABLET | Freq: Four times a day (QID) | ORAL | Status: DC | PRN

## 2018-08-15 MED ORDER — MAGNESIUM SULFATE 2 GM/50ML IV SOLN
2.0000 g | Freq: Once | INTRAVENOUS | Status: AC
  Administered 2018-08-15: 18:00:00 2 g via INTRAVENOUS
  Filled 2018-08-15: qty 50

## 2018-08-15 MED ORDER — PANTOPRAZOLE SODIUM 40 MG PO TBEC
40.0000 mg | DELAYED_RELEASE_TABLET | Freq: Every day | ORAL | Status: DC
  Administered 2018-08-16 – 2018-08-18 (×3): 40 mg via ORAL
  Filled 2018-08-15 (×3): qty 1

## 2018-08-15 MED ORDER — ASPIRIN EC 81 MG PO TBEC
81.0000 mg | DELAYED_RELEASE_TABLET | Freq: Every day | ORAL | Status: DC
  Administered 2018-08-16 – 2018-08-18 (×3): 81 mg via ORAL
  Filled 2018-08-15 (×3): qty 1

## 2018-08-15 MED ORDER — ONDANSETRON HCL 4 MG/2ML IJ SOLN: 4.0000 mg | Freq: Four times a day (QID) | INTRAMUSCULAR | Status: DC | PRN

## 2018-08-15 NOTE — ED Notes (Signed)
Pt is having difficulty tolerating IV potassium, d/c and switch to oral per Dr Nani Ravens

## 2018-08-15 NOTE — ED Triage Notes (Signed)
Pt here via [facility], was shopping at [employer] when she got into a verbal altercation, began feeling hot and sweaty and thought she was going to pass out. A&O x4.
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
## 2018-08-15 NOTE — H&P (Signed)
History and Physical    DORENE BRUNI PIR:[id number] DOB: 1946-04-15 DOA: 08/15/2018  PCP: Gaynelle Arabian, MD  Patient coming from: home  Chief Complaint: light-headed  HPI: Angela Bond is a 73 y.o. female with medical history significant of CAD w stenting, essential hypertension, unsteady gait, GERD.  She lives at home and provides the history herself.  The patient was grocery shopping and while checking out, she began to feel lightheaded, flushed, nauseous, and sweaty.  This sensation lasted for around 15 minutes before resolving.  She has had continued weakness since then.  She denies any recent travel, upper respiratory symptoms, shortness of breath, cough, abdominal pain, diarrhea, nausea/vomiting outside of the above episode, urinary complaints, or rashes.  She did not even feel feverish until coming to the emergency department.  Patient states she is very hungry and is eating and drinking normally prior to this.  Of note, she is on Lasix every other day.  Her potassium has always been within normal limits without taking supplemental potassium with a diuretic.  She also takes a magnesium supplement daily.  ED Course: Potassium found to be 2.1.  Magnesium 1.3.  Repletion started for both.  Chest x-ray unremarkable.  Urinalysis/microscopy shows some bacteria without bleeding.  Lactic acid 2.4. EKG without acute changes suggestive of hypokalemia.  Review of Systems: As per HPI otherwise 10 point review of systems negative.   Past Medical History:  Diagnosis Date  . Anemia    hx  . Anxiety and depression   . Aortic atherosclerosis ([place]) 07/31/2017  . Arthritis    "~ all over my body; in the joints"  . Breast cancer, right ([place]) 3/16  . Coronary artery disease    a.  s/p NSTEMI in June 2017 with DES to mid LAD.  Marland Kitchen Depression   . Gallstones   . GERD (gastroesophageal reflux disease)   . H/O hiatal hernia   . Headache   . Heart murmur   . History of blood transfusion    "when  taking chemo & w/one of my knee replacements" (11/05/2015)  . Hypercholesteremia   . Hypertension   . Iron deficiency anemia 02/16/2017  . Multiple bruises   . Myocardial infarction ([place])   . Orthostatic hypotension   . Personal history of chemotherapy   . Personal history of radiation therapy   . Pneumonia X 1  . Shingles 5/15   hx  . Sliding hiatal hernia s/p lap PEH repair 07/31/2012  . Unstable angina ([place]) 01/23/2012  . Unsteady gait    " I fall easily"  . Wears dentures    partial  . Wears glasses    Past Surgical History:  Procedure Laterality Date  . BREAST BIOPSY Right   . BREAST LUMPECTOMY Right 08/2014  . BREAST LUMPECTOMY WITH SENTINEL LYMPH NODE BIOPSY Right 09/22/2014   Procedure: RIGHT BREAST LUMPECTOMY WITH NEEDLE LOCALIZATION AND RIGHT AXILLARY SENTINEL LYMPH NODE BIOPSY;  Surgeon: [name], MD;  Location: [facility];  Service: General;  Laterality: Right;  . CARDIAC CATHETERIZATION    . CARDIAC CATHETERIZATION N/A 11/05/2015   Procedure: Left Heart Cath and Coronary Angiography;  Surgeon: Belva Crome, MD;  Location: [facility];  Service: Cardiovascular;  Laterality: N/A;  . CARDIAC CATHETERIZATION N/A 11/05/2015   Procedure: Coronary Stent Intervention;  Surgeon: Belva Crome, MD;  Location: [facility];  Service: Cardiovascular;  Laterality: N/A;  . CARPAL TUNNEL RELEASE Right 09/2015  . CHOLECYSTECTOMY N/A 03/28/2018  Procedure: LAPAROSCOPIC CHOLECYSTECTOMY WITH INTRAOPERATIVE CHOLANGIOGRAM ERAS PATHWAY;  Surgeon: Jovita Kussmaul, MD;  Location: [facility];  Service: General;  Laterality: N/A;  . CORONARY ANGIOPLASTY WITH STENT PLACEMENT  11/05/2015   "1 stent"  . ESOPHAGEAL MANOMETRY N/A 07/02/2012   Procedure: ESOPHAGEAL MANOMETRY (EM);  Surgeon: Lear Ng, MD;  Location: [facility];  Service: Endoscopy;  Laterality: N/A;  schooler to read  . EYE SURGERY Left 2013   tube placed tear duct  . HIATAL HERNIA REPAIR N/A 08/10/2012   Procedure:  LAPAROSCOPIC REPAIR OF HIATAL HERNIA WITH MESH AND EGD WITH PEG TUBE PLACEMENT;  Surgeon: Ralene Ok, MD;  Location: [facility];  Service: General;  Laterality: N/A;  . LEFT HEART CATH AND CORONARY ANGIOGRAPHY N/A 08/01/2017   Procedure: LEFT HEART CATH AND CORONARY ANGIOGRAPHY;  Surgeon: Jettie Booze, MD;  Location: [facility];  Service: Cardiovascular;  Laterality: N/A;  . PORT-A-CATH REMOVAL  06/2015  . PORTACATH PLACEMENT Left 09/22/2014   Procedure: INSERTION PORT-A-CATH;  Surgeon: [name], MD;  Location: [facility];  Service: General;  Laterality: Left;  . POSTERIOR LUMBAR FUSION  2016   "had 2 vertebrae fused"  . TONSILLECTOMY  as child  . TOTAL KNEE ARTHROPLASTY Bilateral 2011,2012     reports that she has never smoked. She has never used smokeless tobacco. She reports that she does not drink alcohol or use drugs.  Allergies  Allergen Reactions  . Shellfish Allergy Anaphylaxis and Other (See Comments)    ANAPHYLAXIS TO OYSTERS  . Aspirin Other (See Comments)    Burns stomach  . Penicillins Nausea And Vomiting and Other (See Comments)    PATIENT HAS HAD A PCN REACTION WITH IMMEDIATE RASH, FACIAL/TONGUE/THROAT SWELLING, SOB, OR LIGHTHEADEDNESS WITH HYPOTENSION:  #  #  YES  #  #  Has patient had a PCN reaction causing severe rash involving mucus membranes or skin necrosis: No Has patient had a PCN reaction that required hospitalization: No Has patient had a PCN reaction occurring within the last 10 years: No If all of the above answers are "NO", then may proceed with Cephalosporin use.   [name] [Naproxen] Nausea And Vomiting  . Brilinta [Ticagrelor] Other (See Comments)    Indigestion  . Ciprofloxacin Nausea And Vomiting  . Doxycycline Nausea And Vomiting  . Evista [Raloxifene] Other (See Comments)    LEG ACHES  . Fosamax [Alendronate Sodium] Other (See Comments)    Leg aches  . Lactose Intolerance (Gi) Nausea And Vomiting  . Latex Rash  . Naproxen Sodium  Nausea And Vomiting  . Nitrofurantoin Rash and Other (See Comments)    "I broke out down there"  . Pravachol [Pravastatin] Other (See Comments)    GI upset   . Sulfa Antibiotics Nausea And Vomiting  . Tape Rash  . Welchol [Colesevelam Hcl] Other (See Comments)    Stomach cramps  . Zocor [Simvastatin] Rash    Family History  Problem Relation Age of Onset  . Heart disease Mother        11s, CHF  . Heart disease Father        d/o MI at 45  . Cancer Father        skin  . Diabetes Father   . Cancer Sister 35       breast  . Diabetes Sister   . Heart disease Sister        CABG in mid-60s    Prior to Admission medications  Medication Sig Start Date End Date Taking? Authorizing Provider  acetaminophen (TYLENOL) 500 MG tablet Take 1,000 mg by mouth every 6 (six) hours as needed for moderate pain or headache.    [provider]  aspirin EC 81 MG EC tablet Take 1 tablet (81 mg total) by mouth daily. 11/06/15   Lyda Jester M, PA-C  carboxymethylcellulose (REFRESH TEARS) 0.5 % SOLN Place 1 drop into both eyes 4 (four) times daily.    [provider]  Cholecalciferol (VITAMIN D) 2000 UNITS tablet Take 2,000 Units by mouth 2 (two) times daily.     [provider]  clopidogrel (PLAVIX) 75 MG tablet TAKE 1 TABLET BY MOUTH  DAILY Patient taking differently: Take 75 mg by mouth daily.  03/12/18   Jerline Pain, MD  dexlansoprazole (DEXILANT) 60 MG capsule Take 60 mg by mouth daily.    [provider]  diphenhydrAMINE-zinc acetate (BENADRYL) cream Apply 1 application topically at bedtime as needed for itching.    [provider]  escitalopram (LEXAPRO) 20 MG tablet Take 20 mg by mouth daily. 01/22/16   [provider]  ezetimibe (ZETIA) 10 MG tablet Take 1 tablet (10 mg total) by mouth at bedtime. 01/29/18   Burtis Junes, NP  fexofenadine (ALLEGRA) 180 MG tablet Take 180 mg by mouth daily as needed for allergies.     [provider]  furosemide (LASIX) 20 MG tablet Take 1 tablet (20 mg total) by mouth every other day. 04/09/18   Burtis Junes, NP  gabapentin (NEURONTIN) 600 MG tablet Take 1 tablet (600 mg total) by mouth 3 (three) times daily. Patient taking differently: Take 600 mg by mouth 2 (two) times daily.  11/03/17   Hyatt, Max T, DPM  LORazepam (ATIVAN) 1 MG tablet Take 1 mg by mouth at bedtime.     [provider]  lovastatin (MEVACOR) 40 MG tablet TAKE 1 TABLET BY MOUTH AT  BEDTIME 06/29/18   Jerline Pain, MD  Magnesium 500 MG TABS Take 500 mg by mouth at bedtime.    [provider]  meclizine (ANTIVERT) 25 MG tablet Take 12.5-25 mg by mouth 3 (three) times daily as needed for dizziness.    [provider]  nitroGLYCERIN (NITROSTAT) 0.4 MG SL tablet Place 1 tablet (0.4 mg total) under the tongue every 5 (five) minutes as needed for chest pain (CP or SOB). 09/22/16   Jerline Pain, MD  nystatin cream (MYCOSTATIN) Apply 1 application topically 2 (two) times daily. 05/17/18   Fontaine, Belinda Block, MD  ranitidine (ZANTAC) 150 MG tablet Take 150 mg by mouth at bedtime.    [provider]  sodium chloride (OCEAN) 0.65 % SOLN nasal spray Place 2 sprays into both nostrils as needed for congestion.    [provider]  vitamin C (ASCORBIC ACID) 500 MG tablet Take 500 mg by mouth daily.    [provider]    Physical Exam: Vitals:   08/15/18 1645 08/15/18 1715 08/15/18 1815 08/15/18 1830  BP: 108/63 107/67 113/75 116/63  Pulse: 73 73 70   Resp: 19 14 14 15   Temp:      TempSrc:      SpO2: 95% 96% 93%     Constitutional: NAD, calm, comfortable Eyes: PERRL, lids and conjunctivae normal ENMT: Mucous membranes are moist. Posterior pharynx clear of any exudate or lesions.Normal dentition.  Neck: normal, supple, no masses, no thyromegaly Respiratory: clear to auscultation bilaterally, no wheezing, no crackles.  Normal respiratory effort. No accessory  muscle use.  Cardiovascular: RRR. No extremity edema. 2+ pedal pulses. No carotid bruits.  Abdomen: ttp over suprapubic region and epigastric region (which she relates to GERD), nondistended, no masses palpated. No hepatosplenomegaly. Bowel sounds positive.  Musculoskeletal: no clubbing / cyanosis. No joint deformity upper and lower extremities. Good ROM, no contractures. Normal muscle tone.  Skin: no rashes, lesions, ulcers.  Neurologic: CN 2-12 grossly intact. Sensation intact, DTR normal.  Psychiatric: Normal judgment and insight. Alert and oriented x 3. Normal mood.   Labs on Admission: I have personally reviewed following labs and imaging studies  Basic Metabolic Panel: Recent Labs  Lab 08/15/18 1545 08/15/18 1700  NA 142  --   K 2.1*  --   CL 109  --   CO2 20*  --   GLUCOSE 84  --   BUN 6*  --   CREATININE 0.79  --   CALCIUM 6.6*  --   MG  --  1.3*  Liver Function Tests: Recent Labs  Lab 08/15/18 1545  AST 16  ALT 11  ALKPHOS 72  BILITOT 0.7  PROT 5.8*  ALBUMIN 2.5*   Cardiac Enzymes: Recent Labs  Lab 08/15/18 1545  TROPONINI <0.03   Urine analysis:    Component Value Date/Time   COLORURINE YELLOW 08/15/2018 1640   APPEARANCEUR CLEAR 08/15/2018 1640   LABSPEC 1.009 08/15/2018 1640   LABSPEC 1.010 03/17/2015 1216   PHURINE 7.0 08/15/2018 1640   GLUCOSEU NEGATIVE 08/15/2018 1640   GLUCOSEU Negative 03/17/2015 1216   HGBUR SMALL (A) 08/15/2018 1640   BILIRUBINUR NEGATIVE 08/15/2018 1640   BILIRUBINUR Negative 03/17/2015 1216   KETONESUR NEGATIVE 08/15/2018 1640   PROTEINUR NEGATIVE 08/15/2018 1640   UROBILINOGEN 0.2 03/17/2015 1216   NITRITE NEGATIVE 08/15/2018 1640   LEUKOCYTESUR NEGATIVE 08/15/2018 1640   LEUKOCYTESUR Trace 03/17/2015 1216   Radiological Exams on Admission: Dg Chest Port 1 View  Result Date: 08/15/2018 CLINICAL DATA:  Fever. Pt in droplet and contact precautions, pt wearing droplet mask, tech wearing gown, gloves, droplet mask,  buffont. EXAM: PORTABLE CHEST 1 VIEW COMPARISON:  07/31/2017 and older studies. FINDINGS: Cardiac silhouette is normal size. No mediastinal masses. There is prominence of the right hilum which appears to be vascular and is stable from multiple prior studies a back several years. No convincing hilar masses or enlarged lymph nodes. Mild scarring in the upper lobes, right greater than left, stable. Lungs otherwise clear. No pleural effusion or pneumothorax. Skeletal structures are grossly intact. There stable changes from prior right breast surgery. IMPRESSION: No active disease. Electronically Signed   By: Lajean Manes M.D.   On: 08/15/2018 16:15   EKG: Independently reviewed.   Assessment/Plan Principal Problem:   Hypokalemia  Replete; as she is eating, will do K[place] con 40 mEq TID  Check BMP at midnight and tomorrow AM  Replete mag  Telemetry for next 24 hrs or until day team takes over  No acute EKG changes on admission  Hold Lasix for now Active Problems:   GERD (gastroesophageal reflux disease)  Cont Dexilant 60 mg daily  Change ranitidine to famotidine nightly   CAD (coronary artery disease), native coronary artery  Cont Plavix and ASA  Change lovastatin to atorvastatin due to hosp formulary   Hypomagnesemia  800 mg bid  Reck in AM   Fever  Gentle hydration LR 50 ml/hr- no swelling on admission, reassess in AM  CXR neg, no URI s/s's, no GI issues other than  TTP  Small amount of bacteria on microscopy, will empirically tx for UTI with Rocephin 1 g daily, culture pending  Ck flu, RVP and Covid 19 due to lack of good source  Precautions    Lactic acidosis  Reck in AM  DVT prophylaxis: Lovenox Code Status: Full  Family Communication: Self Disposition Plan: Home Consults called: None  Admission status: inpatient  Severity of Illness: The appropriate patient status for this patient is INPATIENT. Inpatient status is judged to be reasonable and necessary in order to provide the  required intensity of service to ensure the patient's safety. The patient's presenting symptoms, physical exam findings, and initial radiographic and laboratory data in the context of their chronic comorbidities is felt to place them at high risk for further clinical deterioration. Furthermore, it is not anticipated that the patient will be medically stable for discharge from the hospital within 2 midnights of admission. The following factors support the patient status of inpatient.   " The patient's presenting symptoms include weakness. " The worrisome physical exam findings include fevers. " The initial radiographic and laboratory data are worrisome because of lactic acidosis. " The chronic co-morbidities include CAD.   * I certify that at the point of admission it is my clinical judgment that the patient will require inpatient hospital care spanning beyond 2 midnights from the point of admission due to high intensity of service, high risk for further deterioration and high frequency of surveillance required.*   Shelda Pal, DO [facility] [email or web address] 08/15/2018, 6:49 PM

## 2018-08-15 NOTE — ED Notes (Signed)
ED TO INPATIENT HANDOFF REPORT  ED Nurse Name and Phone #:  Lowella Petties [phone]  S Name/Age/Gender [name] 73 y.o. female Room/Bed: 046C/046C  Code Status   Code Status: Full Code  Home/SNF/Other Home Patient oriented to: self, place, time and situation Is this baseline? Yes   Triage Complete: Triage complete  Chief Complaint syncope    Triage Note Pt here via [facility], was shopping at [employer] when she got into a verbal altercation, began feeling hot and sweaty and thought she was going to pass out. A&O x4.    Allergies Allergies  Allergen Reactions  . Shellfish Allergy Anaphylaxis and Other (See Comments)    ANAPHYLAXIS TO OYSTERS  . Aspirin Other (See Comments)    Burns stomach  . Penicillins Nausea And Vomiting and Other (See Comments)    PATIENT HAS HAD A PCN REACTION WITH IMMEDIATE RASH, FACIAL/TONGUE/THROAT SWELLING, SOB, OR LIGHTHEADEDNESS WITH HYPOTENSION:  #  #  YES  #  #  Has patient had a PCN reaction causing severe rash involving mucus membranes or skin necrosis: No Has patient had a PCN reaction that required hospitalization: No Has patient had a PCN reaction occurring within the last 10 years: No If all of the above answers are "NO", then may proceed with Cephalosporin use.   [name] [Naproxen] Nausea And Vomiting  . Brilinta [Ticagrelor] Other (See Comments)    Indigestion  . Ciprofloxacin Nausea And Vomiting  . Doxycycline Nausea And Vomiting  . Evista [Raloxifene] Other (See Comments)    LEG ACHES  . Fosamax [Alendronate Sodium] Other (See Comments)    Leg aches  . Lactose Intolerance (Gi) Nausea And Vomiting  . Latex Rash  . Naproxen Sodium Nausea And Vomiting  . Nitrofurantoin Rash and Other (See Comments)    "I broke out down there"  . Pravachol [Pravastatin] Other (See Comments)    GI upset   . Sulfa Antibiotics Nausea And Vomiting  . Tape Rash  . Welchol [Colesevelam Hcl] Other (See Comments)    Stomach cramps  . Zocor [Simvastatin]  Rash    Level of Care/Admitting Diagnosis ED Disposition    ED Disposition Condition [facility] Area: [place] [[id number]]  Level of Care: Telemetry Medical [104]  Diagnosis: Hypokalemia [269485]  Admitting Physician: Shelda Pal [[id number]]  Attending Physician: Shelda Pal [phone]  Estimated length of stay: past midnight tomorrow  Certification:: I certify this patient will need inpatient services for at least 2 midnights  Bed request comments: Covid r/o, low risk  PT Class (Do Not Modify): Inpatient [101]  PT Acc Code (Do Not Modify): Private [1]       B Medical/Surgery History Past Medical History:  Diagnosis Date  . Anemia    hx  . Anxiety and depression   . Aortic atherosclerosis ([place]) 07/31/2017  . Arthritis    "~ all over my body; in the joints"  . Breast cancer, right ([place]) 3/16  . Coronary artery disease    a.  s/p NSTEMI in June 2017 with DES to mid LAD.  Marland Kitchen Depression   . Gallstones   . GERD (gastroesophageal reflux disease)   . H/O hiatal hernia   . Headache   . Heart murmur   . History of blood transfusion    "when taking chemo & w/one of my knee replacements" (11/05/2015)  . Hypercholesteremia   . Hypertension   . Iron deficiency anemia 02/16/2017  . Multiple bruises   .  Myocardial infarction ([place])   . Orthostatic hypotension   . Personal history of chemotherapy   . Personal history of radiation therapy   . Pneumonia X 1  . Shingles 5/15   hx  . Sliding hiatal hernia s/p lap PEH repair 07/31/2012  . Unstable angina ([place]) 01/23/2012  . Unsteady gait    " I fall easily"  . Wears dentures    partial  . Wears glasses    Past Surgical History:  Procedure Laterality Date  . BREAST BIOPSY Right   . BREAST LUMPECTOMY Right 08/2014  . BREAST LUMPECTOMY WITH SENTINEL LYMPH NODE BIOPSY Right 09/22/2014   Procedure: RIGHT BREAST LUMPECTOMY WITH NEEDLE LOCALIZATION AND RIGHT AXILLARY SENTINEL LYMPH NODE  BIOPSY;  Surgeon: [name], MD;  Location: [facility];  Service: General;  Laterality: Right;  . CARDIAC CATHETERIZATION    . CARDIAC CATHETERIZATION N/A 11/05/2015   Procedure: Left Heart Cath and Coronary Angiography;  Surgeon: Belva Crome, MD;  Location: [facility];  Service: Cardiovascular;  Laterality: N/A;  . CARDIAC CATHETERIZATION N/A 11/05/2015   Procedure: Coronary Stent Intervention;  Surgeon: Belva Crome, MD;  Location: [facility];  Service: Cardiovascular;  Laterality: N/A;  . CARPAL TUNNEL RELEASE Right 09/2015  . CHOLECYSTECTOMY N/A 03/28/2018   Procedure: LAPAROSCOPIC CHOLECYSTECTOMY WITH INTRAOPERATIVE CHOLANGIOGRAM ERAS PATHWAY;  Surgeon: Jovita Kussmaul, MD;  Location: [facility];  Service: General;  Laterality: N/A;  . CORONARY ANGIOPLASTY WITH STENT PLACEMENT  11/05/2015   "1 stent"  . ESOPHAGEAL MANOMETRY N/A 07/02/2012   Procedure: ESOPHAGEAL MANOMETRY (EM);  Surgeon: Lear Ng, MD;  Location: [facility];  Service: Endoscopy;  Laterality: N/A;  schooler to read  . EYE SURGERY Left 2013   tube placed tear duct  . HIATAL HERNIA REPAIR N/A 08/10/2012   Procedure: LAPAROSCOPIC REPAIR OF HIATAL HERNIA WITH MESH AND EGD WITH PEG TUBE PLACEMENT;  Surgeon: Ralene Ok, MD;  Location[facility];  Service: General;  Laterality: N/A;  . LEFT HEART CATH AND CORONARY ANGIOGRAPHY N/A 08/01/2017   Procedure: LEFT HEART CATH AND CORONARY ANGIOGRAPHY;  Surgeon: Jettie Booze, MD;  Location: [facility];  Service: Cardiovascular;  Laterality: N/A;  . PORT-A-CATH REMOVAL  06/2015  . PORTACATH PLACEMENT Left 09/22/2014   Procedure: INSERTION PORT-A-CATH;  Surgeon: [name], MD;  Location: [facility];  Service: General;  Laterality: Left;  . POSTERIOR LUMBAR FUSION  2016   "had 2 vertebrae fused"  . TONSILLECTOMY  as child  . TOTAL KNEE ARTHROPLASTY Bilateral 2011,2012     A IV Location/Drains/Wounds Patient Lines/Drains/Airways Status   Active  Line/Drains/Airways    Name:   Placement date:   Placement time:   Site:   Days:   Peripheral IV 08/15/18 Left Hand   08/15/18    1542    Hand   less than 1   External Urinary Catheter   03/28/18    2200    -   140   Incision (Closed) 03/28/18 Abdomen Other (Comment)   03/28/18    1545     140   Incision - 4 Ports Abdomen 1: Umbilicus 2: Mid;Upper 3: Right;Medial;Upper 4: Right;Lateral;Lower   03/28/18    1546     140          Intake/Output Last 24 hours  Intake/Output Summary (Last 24 hours) at 08/15/2018 2129 Last data filed at 08/15/2018 2039 Gross per 24 hour  Intake 248.58 ml  Output -  Net 248.58 ml    Labs/Imaging Results for orders placed or performed during the hospital encounter of 08/15/18 (from the past 48 hour(s))  Comprehensive metabolic panel     Status: Abnormal   Collection Time: 08/15/18  3:45 PM  Result Value Ref Range   Sodium 142 135 - 145 mmol/L   Potassium 2.1 (LL) 3.5 - 5.1 mmol/L    Comment: CRITICAL RESULT CALLED TO, READ BACK BY AND VERIFIED WITH: E CLINE,RN 1704 08/15/2018 D BRADLEY    Chloride 109 98 - 111 mmol/L   CO2 20 (L) 22 - 32 mmol/L   Glucose, Bld 84 70 - 99 mg/dL   BUN 6 (L) 8 - 23 mg/dL   Creatinine, Ser 0.79 0.44 - 1.00 mg/dL   Calcium 6.6 (L) 8.9 - 10.3 mg/dL   Total Protein 5.8 (L) 6.5 - 8.1 g/dL   Albumin 2.5 (L) 3.5 - 5.0 g/dL   AST 16 15 - 41 U/L   ALT 11 0 - 44 U/L   Alkaline Phosphatase 72 38 - 126 U/L   Total Bilirubin 0.7 0.3 - 1.2 mg/dL   GFR calc non Af Amer >60 >60 mL/min   GFR calc Af Amer >60 >60 mL/min   Anion gap 13 5 - 15    Comment: Performed at [facility], [address], [place], [place] [address]  Troponin I - ONCE - STAT     Status: None   Collection Time: 08/15/18  3:45 PM  Result Value Ref Range   Troponin I <0.03 <0.03 ng/mL    Comment: Performed at [facility], [place] [address] [place], [place] [address]  Lactic acid, plasma     Status: Abnormal   Collection Time: 08/15/18  3:45 PM  Result  Value Ref Range   Lactic Acid, Venous 2.4 (HH) 0.5 - 1.9 mmol/L    Comment: CRITICAL RESULT CALLED TO, READ BACK BY AND VERIFIED WITH: L BISHOP,RN 1649 08/15/2018 D BRADLEY Performed at [facility], [place] [address] [place], [place] [address]   Urinalysis, Routine w reflex microscopic     Status: Abnormal   Collection Time: 08/15/18  4:40 PM  Result Value Ref Range   Color, Urine YELLOW YELLOW   APPearance CLEAR CLEAR   Specific Gravity, Urine 1.009 1.005 - 1.030   pH 7.0 5.0 - 8.0   Glucose, UA NEGATIVE NEGATIVE mg/dL   Hgb urine dipstick SMALL (A) NEGATIVE   Bilirubin Urine NEGATIVE NEGATIVE   Ketones, ur NEGATIVE NEGATIVE mg/dL   Protein, ur NEGATIVE NEGATIVE mg/dL   Nitrite NEGATIVE NEGATIVE   Leukocytes,Ua NEGATIVE NEGATIVE   RBC / HPF 6-10 0 - 5 RBC/hpf   WBC, UA 0-5 0 - 5 WBC/hpf   Bacteria, UA RARE (A) NONE SEEN   Squamous Epithelial / LPF 0-5 0 - 5   Mucus PRESENT     Comment: Performed at [facility], [place] [address] [place],  [address]  Magnesium     Status: Abnormal   Collection Time: 08/15/18  5:00 PM  Result Value Ref Range   Magnesium 1.3 (L) 1.7 - 2.4 mg/dL    Comment: Performed at [facility] [address], [place], [place] [address]  Lactic acid, plasma     Status: None   Collection Time: 08/15/18  5:47 PM  Result Value Ref Range   Lactic Acid, Venous 1.0 0.5 - 1.9 mmol/L    Comment: Performed at [facility],  [address], [place], [place] [address]  CBC with Differential     Status: Abnormal   Collection Time: 08/15/18  5:47 PM  Result Value Ref Range   WBC 8.0 4.0 - 10.5 K/uL   RBC 3.90 3.87 - 5.11 MIL/uL   Hemoglobin 11.9 (L) 12.0 - 15.0 g/dL   HCT 36.3 36.0 - 46.0 %   MCV 93.1 80.0 - 100.0 fL   MCH 30.5 26.0 - 34.0 pg   MCHC 32.8 30.0 - 36.0 g/dL   RDW 15.0 11.5 - 15.5 %   Platelets 186 150 - 400 K/uL   nRBC 0.0 0.0 - 0.2 %   Neutrophils Relative % 79 %   Neutro Abs 6.3 1.7 - 7.7 K/uL   Lymphocytes Relative  10 %   Lymphs Abs 0.8 0.7 - 4.0 K/uL   Monocytes Relative 10 %   Monocytes Absolute 0.8 0.1 - 1.0 K/uL   Eosinophils Relative 1 %   Eosinophils Absolute 0.1 0.0 - 0.5 K/uL   Basophils Relative 0 %   Basophils Absolute 0.0 0.0 - 0.1 K/uL   Immature Granulocytes 0 %   Abs Immature Granulocytes 0.03 0.00 - 0.07 K/uL    Comment: Performed at [facility] [address], [place], [place] [address]  Influenza panel by PCR (type A & B)     Status: None   Collection Time: 08/15/18  8:10 PM  Result Value Ref Range   Influenza A By PCR NEGATIVE NEGATIVE   Influenza B By PCR NEGATIVE NEGATIVE    Comment: (NOTE) The Xpert Xpress Flu assay is intended as an aid in the diagnosis of  influenza and should not be used as a sole basis for treatment.  This  assay is FDA approved for nasopharyngeal swab specimens only. Nasal  washings and aspirates are unacceptable for Xpert Xpress Flu testing. Performed at [facility], [place] [address], [place], [place] [address]    Dg Chest Port 1 View  Result Date: 08/15/2018 CLINICAL DATA:  Fever. Pt in droplet and contact precautions, pt wearing droplet mask, tech wearing gown, gloves, droplet mask, buffont. EXAM: PORTABLE CHEST 1 VIEW COMPARISON:  07/31/2017 and older studies. FINDINGS: Cardiac silhouette is normal size. No mediastinal masses. There is prominence of the right hilum which appears to be vascular and is stable from multiple prior studies a back several years. No convincing hilar masses or enlarged lymph nodes. Mild scarring in the upper lobes, right greater than left, stable. Lungs otherwise clear. No pleural effusion or pneumothorax. Skeletal structures are grossly intact. There stable changes from prior right breast surgery. IMPRESSION: No active disease. Electronically Signed   By: Lajean Manes M.D.   On: 08/15/2018 16:15    Pending Labs Unresulted Labs (From admission, onward)    Start     Ordered   08/22/18 0500  Creatinine, serum   (enoxaparin (LOVENOX)    CrCl >/= 30 ml/min)  Weekly,   R    Comments:  while on enoxaparin therapy    08/15/18 1848   08/16/18 7782  Basic metabolic panel  Tomorrow morning,   R     08/15/18 1848   08/16/18 0500  CBC  Tomorrow morning,   R     08/15/18 1848   08/16/18 0500  Magnesium  Tomorrow morning,   R     08/15/18 1849   08/16/18 0500  Lactic acid, plasma  Tomorrow morning,   R     08/15/18 1904  08/16/18 6759  Basic metabolic panel  Once-Timed,   R     08/15/18 1848   08/15/18 1906  Novel Coronavirus, NAA (hospital order; send-out to ref lab)  (Novel Coronavirus, NAA [facility] Order; send-out to ref lab) with precautions panel)  Once,   R    Comments:  Low risk   Question Answer Comment  Current symptoms Other (testing not indicated)   Patient immune status Normal      08/15/18 1906   08/15/18 1858  Respiratory Panel by PCR  (Respiratory virus panel with precautions)  Once,   R     08/15/18 1857   08/15/18 1844  CBC  (enoxaparin (LOVENOX)    CrCl >/= 30 ml/min)  Once,   R    Comments:  Baseline for enoxaparin therapy IF NOT ALREADY DRAWN.  Notify MD if PLT < 100 K.    08/15/18 1848   08/15/18 1844  Creatinine, serum  (enoxaparin (LOVENOX)    CrCl >/= 30 ml/min)  Once,   R    Comments:  Baseline for enoxaparin therapy IF NOT ALREADY DRAWN.    08/15/18 1848   08/15/18 1534  Urine culture  ONCE - STAT,   STAT     08/15/18 1533   08/15/18 1533  Blood culture (routine x 2)  BLOOD CULTURE X 2,   STAT     08/15/18 1532   08/15/18 1522  CBC with Differential/Platelet  Once,   R     08/15/18 1522          Vitals/Pain Today's Vitals   08/15/18 2030 08/15/18 2045 08/15/18 2100 08/15/18 2115  BP: 132/60 (!) 106/57 128/88 108/65  Pulse: 65 73 70 64  Resp: 18 16 18 17   Temp:      TempSrc:      SpO2: 97% 98% 100% 98%  PainSc:        Isolation Precautions Droplet precaution  Medications Medications  potassium chloride 10 mEq in 100 mL IVPB (10 mEq Intravenous New  Bag/Given 08/15/18 2007)  aspirin EC tablet 81 mg (has no administration in time range)  carboxymethylcellulose (REFRESH PLUS) 0.5 % ophthalmic solution 1 drop (has no administration in time range)  clopidogrel (PLAVIX) tablet 75 mg (has no administration in time range)  escitalopram (LEXAPRO) tablet 20 mg (has no administration in time range)  ezetimibe (ZETIA) tablet 10 mg (has no administration in time range)  gabapentin (NEURONTIN) capsule 600 mg (has no administration in time range)  nitroGLYCERIN (NITROSTAT) SL tablet 0.4 mg (has no administration in time range)  enoxaparin (LOVENOX) injection 40 mg (has no administration in time range)  lactated ringers infusion ( Intravenous New Bag/Given 08/15/18 2059)  acetaminophen (TYLENOL) tablet 650 mg (has no administration in time range)    Or  acetaminophen (TYLENOL) suppository 650 mg (has no administration in time range)  docusate sodium (COLACE) capsule 100 mg (has no administration in time range)  cefTRIAXone (ROCEPHIN) 1 g in sodium chloride 0.9 % 100 mL IVPB (0 g Intravenous Stopped 08/15/18 2039)  ondansetron (ZOFRAN) tablet 4 mg (has no administration in time range)    Or  ondansetron (ZOFRAN) injection 4 mg (has no administration in time range)  magnesium oxide (MAG-OX) tablet 800 mg (has no administration in time range)  potassium chloride SA (K-DUR,KLOR-CON) CR tablet 40 mEq (has no administration in time range)  famotidine (PEPCID) tablet 20 mg (has no administration in time range)  atorvastatin (LIPITOR) tablet 20 mg (has no administration in time range)  pantoprazole (PROTONIX) EC tablet 40 mg (has no administration in time range)  sodium chloride 0.9 % bolus 1,000 mL (1,000 mLs Intravenous New Bag/Given 08/15/18 1542)  acetaminophen (TYLENOL) tablet 1,000 mg (1,000 mg Oral Given 08/15/18 1541)  potassium chloride SA (K-DUR,KLOR-CON) CR tablet 40 mEq (40 mEq Oral Given 08/15/18 1806)  magnesium sulfate IVPB 2 g 50 mL (0 g Intravenous  Stopped 08/15/18 1904)    Mobility walks Low fall risk   Focused Assessments    R Recommendations: See Admitting Provider Note  Report given to:   Additional Notes:

## 2018-08-15 NOTE — ED Notes (Signed)
Phlebotomy at bedside.

## 2018-08-16 DIAGNOSIS — R509 Fever, unspecified: Secondary | ICD-10-CM

## 2018-08-16 DIAGNOSIS — E876 Hypokalemia: Principal | ICD-10-CM

## 2018-08-16 LAB — BASIC METABOLIC PANEL
Anion gap: 7 (ref 5–15)
BUN: 8 mg/dL (ref 8–23)
CO2: 26 mmol/L (ref 22–32)
Calcium: 8.2 mg/dL — ABNORMAL LOW (ref 8.9–10.3)
Chloride: 104 mmol/L (ref 98–111)
Creatinine, Ser: 0.85 mg/dL (ref 0.44–1.00)
GFR calc Af Amer: >60 mL/min (ref >60)
GFR calc non Af Amer: >60 mL/min (ref >60)
Glucose, Bld: 113 mg/dL — ABNORMAL HIGH (ref 70–99)
Potassium: 4 mmol/L (ref 3.5–5.1)
Sodium: 137 mmol/L (ref 135–145)

## 2018-08-16 LAB — CBC
HCT: 34.8 % — ABNORMAL LOW (ref 36.0–46.0)
Hemoglobin: 11 g/dL — ABNORMAL LOW (ref 12.0–15.0)
MCH: 29.3 pg (ref 26.0–34.0)
MCHC: 31.6 g/dL (ref 30.0–36.0)
MCV: 92.6 fL (ref 80.0–100.0)
Platelets: 162 10*3/uL (ref 150–400)
RBC: 3.76 MIL/uL — ABNORMAL LOW (ref 3.87–5.11)
RDW: 15 % (ref 11.5–15.5)
WBC: 6.8 10*3/uL (ref 4.0–10.5)
nRBC: 0 % (ref 0.0–0.2)

## 2018-08-16 LAB — TSH: TSH: 1.074 u[IU]/mL (ref 0.350–4.500)

## 2018-08-16 LAB — MAGNESIUM: Magnesium: 2.2 mg/dL (ref 1.7–2.4)

## 2018-08-16 LAB — MRSA PCR SCREENING: MRSA by PCR: POSITIVE — AB

## 2018-08-16 LAB — LACTIC ACID, PLASMA: Lactic Acid, Venous: 1.9 mmol/L (ref 0.5–1.9)

## 2018-08-16 MED ORDER — CHLORHEXIDINE GLUCONATE CLOTH 2 % EX PADS
6.0000 | MEDICATED_PAD | Freq: Every day | CUTANEOUS | Status: DC
  Administered 2018-08-16 – 2018-08-17 (×2): 6 via TOPICAL

## 2018-08-16 MED ORDER — CYCLOBENZAPRINE HCL 10 MG PO TABS
5.0000 mg | ORAL_TABLET | Freq: Once | ORAL | Status: AC
  Administered 2018-08-16: 5 mg via ORAL
  Filled 2018-08-16: qty 1

## 2018-08-16 MED ORDER — SODIUM CHLORIDE 0.9 % IV SOLN
500.0000 mg | Freq: Once | INTRAVENOUS | Status: AC
  Administered 2018-08-16: 500 mg via INTRAVENOUS
  Filled 2018-08-16: qty 500

## 2018-08-16 MED ORDER — ATORVASTATIN CALCIUM 10 MG PO TABS
20.0000 mg | ORAL_TABLET | Freq: Every day | ORAL | Status: DC
  Administered 2018-08-16 – 2018-08-17 (×2): 20 mg via ORAL
  Filled 2018-08-16 (×2): qty 2

## 2018-08-16 MED ORDER — MUPIROCIN 2 % EX OINT
1.0000 "application " | TOPICAL_OINTMENT | Freq: Two times a day (BID) | CUTANEOUS | Status: DC
  Administered 2018-08-16 – 2018-08-18 (×5): 1 via NASAL
  Filled 2018-08-16 (×2): qty 22

## 2018-08-16 MED ORDER — SODIUM CHLORIDE 0.9 % IV SOLN
500.0000 mg | INTRAVENOUS | Status: DC
  Administered 2018-08-17: 500 mg via INTRAVENOUS
  Filled 2018-08-16 (×3): qty 500

## 2018-08-16 MED ORDER — SODIUM CHLORIDE 0.9 % IV SOLN
1.0000 g | INTRAVENOUS | Status: DC
  Administered 2018-08-16 – 2018-08-17 (×2): 1 g via INTRAVENOUS
  Filled 2018-08-16: qty 1
  Filled 2018-08-16 (×2): qty 10

## 2018-08-16 MED ORDER — ENSURE ENLIVE PO LIQD
237.0000 mL | Freq: Two times a day (BID) | ORAL | Status: DC
  Administered 2018-08-16 – 2018-08-18 (×5): 237 mL via ORAL

## 2018-08-16 MED ORDER — SODIUM CHLORIDE 0.9 % IV SOLN
500.0000 mg | INTRAVENOUS | Status: DC
  Filled 2018-08-16 (×2): qty 500

## 2018-08-16 NOTE — Progress Notes (Signed)
[facility] PROGRESS NOTE  WAFA MARTES LSL:[id number] DOB: October 24, 1945 DOA: 08/15/2018 PCP: Gaynelle Arabian, MD  Assessment/Plan: Symptomatic hypokalemia and hypomagnesemia, improving.  Suspect related to use of home Lasix though patient denies any increase in dosage does not remember any decrease oral intake.  No other obvious etiologies for potassium loss denies any diarrhea/vomiting has no known thyroid issues. - Monitor potassium and magnesium additional 24 hours off Lasix to ensure stability -Check TSH -Hold home Lasix  Syncopal episode Associated with increased weakness related to symptomatic hypokalemia/hypomagnesemia.  Feels strength is much better today. -Continue to monitor neurologic status, expect improvement with electrolytes LFT -Check orthostatic vitals  Fever  Initial fever of 100., T-max today of 101.1.  Unclear etiology given denies any shortness of breath, cough, abdominal pain, diarrhea, urinary symptoms.  Recently completed amoxicillin x10 days (completed last dose 1 week ago) only has residual sinus tenderness.  Doubt UTI as no urinary symptoms and UA unremarkable.  RVP and flu panel negative given age, unclear source would like to rule out CO VID -COVID PCR pending -Follow blood cultures -Continue ceftriaxone, add azithromycin given recent sinusitis as potential nidus  Lactic acidosis, resolved Has slight lactic acidosis of 2.4 on arrival which is since resolved was 1.9 with full resuscitation.  Blood pressure has remained normotensive -Monitor BP  Mood disorder, stable -Continue Lexapro  Neuropathy, stable -Continue gabapentin    Code Status: Full code Family Communication: No family at bedside (indicate person spoken with, relationship, and if by phone, the number) Disposition Plan: Monitor potassium and magnesium, monitor for fever curve   Consultants:  None  Procedures:  None  Antibiotics:  IV ceftriaxone 4/1  IV azithromycin  4/2  HPI/Subjective:  Angela Bond is a 73 y.o. year old female with medical history significant for CAD w stenting, essential hypertension, unsteady gait, GERD who presented on 08/15/2018 with presyncopal episode preceded by lightheadedness, feeling of flushed, nausea and diaphoresis for 15 minutes and was found to have hypokalemia and hypomagnesemia and weakness.  Feels much better this morning Reports improvement in strength No lightheadedness or dizziness No cough, no shortness of breath, no chest pain.  Objective: Vitals:   08/16/18 0451 08/16/18 0637  BP: 126/68   Pulse: 74   Resp:    Temp: (!) 101.1 F (38.4 C) 100.2 F (37.9 C)  SpO2:      Intake/Output Summary (Last 24 hours) at 08/16/2018 1143 Last data filed at 08/16/2018 1157 Gross per 24 hour  Intake 1049.41 ml  Output 600 ml  Net 449.41 ml   Filed Weights   08/15/18 2253  Weight: 76.4 kg    Exam:   General: Elderly female lying in bed, no distress  Cardiovascular: Regular rate and rhythm, no edema  Respiratory: Normal respiratory effort on room air  Abdomen: Soft, nondistended, nontender  Musculoskeletal: Normal range of motion  Skin no rashes or lesions  Neurologic alert oriented x4, no appreciable focal deficits  Data Reviewed: Basic Metabolic Panel: Recent Labs  Lab 08/15/18 1545 08/15/18 1700 08/16/18 0551  NA 142  --  137  K 2.1*  --  4.0  CL 109  --  104  CO2 20*  --  26  GLUCOSE 84  --  113*  BUN 6*  --  8  CREATININE 0.79  --  0.85  CALCIUM 6.6*  --  8.2*  MG  --  1.3* 2.2   Liver Function Tests: Recent Labs  Lab 08/15/18 1545  AST 16  ALT 11  ALKPHOS 72  BILITOT 0.7  PROT 5.8*  ALBUMIN 2.5*   No results for input(s): LIPASE, AMYLASE in the last 168 hours. No results for input(s): AMMONIA in the last 168 hours. CBC: Recent Labs  Lab 08/15/18 1747 08/16/18 0551  WBC 8.0 6.8  NEUTROABS 6.3  --   HGB 11.9* 11.0*  HCT 36.3 34.8*  MCV 93.1 92.6  PLT 186 162    Cardiac Enzymes: Recent Labs  Lab 08/15/18 1545  TROPONINI <0.03   BNP (last 3 results) No results for input(s): BNP in the last 8760 hours.  ProBNP (last 3 results) No results for input(s): PROBNP in the last 8760 hours.  CBG: No results for input(s): GLUCAP in the last 168 hours.  Recent Results (from the past 240 hour(s))  Respiratory Panel by PCR     Status: None   Collection Time: 08/15/18  8:10 PM  Result Value Ref Range Status   Adenovirus NOT DETECTED NOT DETECTED Final   Coronavirus 229E NOT DETECTED NOT DETECTED Final    Comment: (NOTE) The Coronavirus on the Respiratory Panel, DOES NOT test for the novel  Coronavirus (2019 nCoV)    Coronavirus HKU1 NOT DETECTED NOT DETECTED Final   Coronavirus NL63 NOT DETECTED NOT DETECTED Final   Coronavirus OC43 NOT DETECTED NOT DETECTED Final   Metapneumovirus NOT DETECTED NOT DETECTED Final   Rhinovirus / Enterovirus NOT DETECTED NOT DETECTED Final   Influenza A NOT DETECTED NOT DETECTED Final   Influenza B NOT DETECTED NOT DETECTED Final   Parainfluenza Virus 1 NOT DETECTED NOT DETECTED Final   Parainfluenza Virus 2 NOT DETECTED NOT DETECTED Final   Parainfluenza Virus 3 NOT DETECTED NOT DETECTED Final   Parainfluenza Virus 4 NOT DETECTED NOT DETECTED Final   Respiratory Syncytial Virus NOT DETECTED NOT DETECTED Final   Bordetella pertussis NOT DETECTED NOT DETECTED Final   Chlamydophila pneumoniae NOT DETECTED NOT DETECTED Final   Mycoplasma pneumoniae NOT DETECTED NOT DETECTED Final    Comment: Performed at [facility], [address], [place], [place] [address]  MRSA PCR Screening     Status: Abnormal   Collection Time: 08/16/18  3:07 AM  Result Value Ref Range Status   MRSA by PCR POSITIVE (A) NEGATIVE Final    Comment:        The GeneXpert MRSA Assay (FDA approved for NASAL specimens only), is one component of a comprehensive MRSA colonization surveillance program. It is not intended to diagnose  MRSA infection nor to guide or monitor treatment for MRSA infections. RESULT CALLED TO, READ BACK BY AND VERIFIED WITH: COLUMBRES,S RN 0543 08/16/2018 MITCHELL,L Performed at [facility], [place] [address] [place], [place] [address]      Studies: Dg Chest Port 1 View  Result Date: 08/15/2018 CLINICAL DATA:  Fever. Pt in droplet and contact precautions, pt wearing droplet mask, tech wearing gown, gloves, droplet mask, buffont. EXAM: PORTABLE CHEST 1 VIEW COMPARISON:  07/31/2017 and older studies. FINDINGS: Cardiac silhouette is normal size. No mediastinal masses. There is prominence of the right hilum which appears to be vascular and is stable from multiple prior studies a back several years. No convincing hilar masses or enlarged lymph nodes. Mild scarring in the upper lobes, right greater than left, stable. Lungs otherwise clear. No pleural effusion or pneumothorax. Skeletal structures are grossly intact. There stable changes from prior right breast surgery. IMPRESSION: No active disease. Electronically Signed   By: Dedra Skeens.D.  On: 08/15/2018 16:15    Scheduled Meds: . aspirin EC  81 mg Oral Daily  . atorvastatin  20 mg Oral q1800  . Chlorhexidine Gluconate Cloth  6 each Topical Q0600  . clopidogrel  75 mg Oral Daily  . docusate sodium  100 mg Oral BID  . enoxaparin (LOVENOX) injection  40 mg Subcutaneous QHS  . escitalopram  20 mg Oral Daily  . ezetimibe  10 mg Oral QHS  . famotidine  20 mg Oral QHS  . feeding supplement (ENSURE ENLIVE)  237 mL Oral BID BM  . gabapentin  600 mg Oral BID  . magnesium oxide  800 mg Oral BID  . mupirocin ointment  1 application Nasal BID  . pantoprazole  40 mg Oral Daily  . polyvinyl alcohol  1 drop Both Eyes QID  . potassium chloride  40 mEq Oral TID   Continuous Infusions: . cefTRIAXone (ROCEPHIN)  IV Stopped (08/15/18 2039)  . lactated ringers 50 mL/hr at 08/15/18 2059    Principal Problem:   Hypokalemia Active Problems:    Hypertension   GERD (gastroesophageal reflux disease)   CAD (coronary artery disease), native coronary artery   Hypomagnesemia   Fever      Desiree Hane  [facility]

## 2018-08-16 NOTE — Progress Notes (Addendum)
Nutrition Brief Note  RD working remotely  Patient identified on the Malnutrition Screening Tool (MST) Report.  Per encounters below, pt has had a 7% weight loss since 05/2018. This is not significant for time frame.  Wt Readings from Last 15 Encounters:  08/15/18 76.4 kg  05/18/18 81.8 kg  05/17/18 81.2 kg  03/28/18 83.5 kg  03/20/18 83.6 kg  02/28/18 85.6 kg  11/22/17 83.4 kg  11/15/17 83.6 kg  11/08/17 83.4 kg  08/09/17 84.3 kg  08/01/17 83.6 kg  06/22/17 81.7 kg  05/25/17 83.1 kg  03/21/17 82.9 kg  03/16/17 82.9 kg   Body mass index is 37.76 kg/m.  meets criteria for Obesity Class II based on current BMI.   Current diet order is Regular.  Pt did not like what she received for breakfast this AM, however, has a good appetite.  Labs and medications reviewed.   No nutrition interventions warranted at this time.  If nutrition issues arise, please consult RD.   Arthur Holms, RD, LDN Pager #: [phone] After-Hours Pager #: [phone]

## 2018-08-17 DIAGNOSIS — R8271 Bacteriuria: Secondary | ICD-10-CM | POA: Diagnosis present

## 2018-08-17 DIAGNOSIS — R6889 Other general symptoms and signs: Secondary | ICD-10-CM

## 2018-08-17 DIAGNOSIS — I10 Essential (primary) hypertension: Secondary | ICD-10-CM

## 2018-08-17 DIAGNOSIS — Z20822 Contact with and (suspected) exposure to covid-19: Secondary | ICD-10-CM | POA: Diagnosis present

## 2018-08-17 LAB — CBC WITH DIFFERENTIAL/PLATELET
Abs Immature Granulocytes: 0.01 10*3/uL (ref 0.00–0.07)
Basophils Absolute: 0 10*3/uL (ref 0.0–0.1)
Basophils Relative: 0 %
Eosinophils Absolute: 0.2 10*3/uL (ref 0.0–0.5)
Eosinophils Relative: 4 %
HCT: 38.8 % (ref 36.0–46.0)
Hemoglobin: 12.2 g/dL (ref 12.0–15.0)
Immature Granulocytes: 0 %
Lymphocytes Relative: 25 %
Lymphs Abs: 1.2 10*3/uL (ref 0.7–4.0)
MCH: 29.3 pg (ref 26.0–34.0)
MCHC: 31.4 g/dL (ref 30.0–36.0)
MCV: 93 fL (ref 80.0–100.0)
Monocytes Absolute: 0.6 10*3/uL (ref 0.1–1.0)
Monocytes Relative: 11 %
Neutro Abs: 3 10*3/uL (ref 1.7–7.7)
Neutrophils Relative %: 60 %
Platelets: 186 10*3/uL (ref 150–400)
RBC: 4.17 MIL/uL (ref 3.87–5.11)
RDW: 15 % (ref 11.5–15.5)
WBC: 5 10*3/uL (ref 4.0–10.5)
nRBC: 0 % (ref 0.0–0.2)

## 2018-08-17 LAB — COMPREHENSIVE METABOLIC PANEL
ALT: 12 U/L (ref 0–44)
AST: 19 U/L (ref 15–41)
Albumin: 2.8 g/dL — ABNORMAL LOW (ref 3.5–5.0)
Alkaline Phosphatase: 87 U/L (ref 38–126)
Anion gap: 8 (ref 5–15)
BUN: 12 mg/dL (ref 8–23)
CO2: 25 mmol/L (ref 22–32)
Calcium: 8.7 mg/dL — ABNORMAL LOW (ref 8.9–10.3)
Chloride: 104 mmol/L (ref 98–111)
Creatinine, Ser: 0.99 mg/dL (ref 0.44–1.00)
GFR calc Af Amer: >60 mL/min (ref >60)
GFR calc non Af Amer: 57 mL/min — ABNORMAL LOW (ref >60)
Glucose, Bld: 118 mg/dL — ABNORMAL HIGH (ref 70–99)
Potassium: 4.9 mmol/L (ref 3.5–5.1)
Sodium: 137 mmol/L (ref 135–145)
Total Bilirubin: 0.5 mg/dL (ref 0.3–1.2)
Total Protein: 6.7 g/dL (ref 6.5–8.1)

## 2018-08-17 LAB — URINE CULTURE: Culture: >=100000 — AB

## 2018-08-17 LAB — MAGNESIUM: Magnesium: 2.1 mg/dL (ref 1.7–2.4)

## 2018-08-17 MED ORDER — SODIUM CHLORIDE 0.9 % IV SOLN
INTRAVENOUS | Status: DC | PRN
  Administered 2018-08-17: 250 mL via INTRAVENOUS

## 2018-08-17 NOTE — Progress Notes (Addendum)
[facility] PROGRESS NOTE  Angela Bond FXT:[id number] DOB: [birth year] DOA: 08/15/2018 PCP: Gaynelle Arabian, MD  Assessment/Plan: Symptomatic hypokalemia and hypomagnesemia, improving.  Suspect related to use of home Lasix though patient denies any increase in dosage does not remember any decrease oral intake.  No other obvious etiologies for potassium loss denies any diarrhea/vomiting has no known thyroid issues and TSH wnl. Potassium and Mag are corrected -Hold home Lasix - discontinue further supplementation and monitor on CMP and Mg check in 24 hours  Syncopal episode Associated with increased weakness related to symptomatic hypokalemia/hypomagnesemia.  -Continue to monitor neurologic status, expect improvement with electrolytes  -Check orthostatic vitals  Fever  Initial fever of 100.6, Tmax of 101 on 4/2(am). Tmax today 99.4.   Unclear etiology given denies any shortness of breath, cough, abdominal pain, diarrhea, urinary symptoms.  Recently completed amoxicillin x10 days (completed last dose 1 week ago) only has residual sinus tenderness.  Doubt UTI as no urinary symptoms and UA unremarkable.  RVP and flu panel negative given age, unclear source would like to rule out CO VID given her respiratory status could worsen fairly quickly given her age she deems continue inpatient care to monitor for 24 hour period without fevers and no new symptoms -COVID PCR pending, -Follow blood cultures -Continue ceftriaxone, and azithromycin given recent sinusitis as potential nidus, plan to deescalate to oral if remains afebrile for 24 hours to cover for sinusitis  Lactic acidosis, resolved  slight lactic acidosis of 2.4 on arrival which is since resolved was 1.9 with fluid resuscitation.  Blood pressure has remained normotensive -Monitor BP  Mood disorder, stable -Continue Lexapro  Neuropathy, stable -Continue gabapentin  Asymptomatic E.coli Bacteriuria Continues to deny any urinary  symptoms. Ua pretty unremarkable as well -no antibiotics as this is not consistent with UTI    Code Status: Full code Family Communication: No family at bedside (indicate person spoken with, relationship, and if by phone, the number) Disposition Plan: Monitor potassium and magnesium, monitor for fever curve unclear source would like to rule out CO VID given her respiratory status could worsen fairly quickly given her age she deems continue inpatient care to monitor for 24 hour period without fevers and no new symptoms   Consultants:  None  Procedures:  None  Antibiotics:  IV ceftriaxone 4/1  IV azithromycin 4/2  HPI/Subjective:  Angela Bond is a 73 y.o. year old female with medical history significant for CAD w stenting, essential hypertension, unsteady gait, GERD who presented on 08/15/2018 with presyncopal episode preceded by lightheadedness, feeling of flushed, nausea and diaphoresis for 15 minutes and was found to have hypokalemia and hypomagnesemia and weakness.  No cough No abd pain, normal urination without odor  Objective: Vitals:   08/17/18 0500 08/17/18 0810  BP: 111/69 (!) 142/95  Pulse: 65 64  Resp: 18 18  Temp: 99.4 F (37.4 C) 98.1 F (36.7 C)  SpO2:      Intake/Output Summary (Last 24 hours) at 08/17/2018 1031 Last data filed at 08/17/2018 0810 Gross per 24 hour  Intake 677.01 ml  Output 1350 ml  Net -672.99 ml   Filed Weights   08/15/18 2253  Weight: 76.4 kg    Exam:   General: Elderly female lying in bed, no distress  Cardiovascular: Regular rate and rhythm, no edema  Respiratory: Normal respiratory effort on room air  Abdomen: Soft, nondistended, nontender  Musculoskeletal: Normal range of motion  Skin no rashes or lesions  Neurologic alert oriented x4, no  appreciable focal deficits  Data Reviewed: Basic Metabolic Panel: Recent Labs  Lab 08/15/18 1545 08/15/18 1700 08/16/18 0551 08/17/18 0832  NA 142  --  137 137  K 2.1*   --  4.0 4.9  CL 109  --  104 104  CO2 20*  --  26 25  GLUCOSE 84  --  113* 118*  BUN 6*  --  8 12  CREATININE 0.79  --  0.85 0.99  CALCIUM 6.6*  --  8.2* 8.7*  MG  --  1.3* 2.2  --    Liver Function Tests: Recent Labs  Lab 08/15/18 1545 08/17/18 0832  AST 16 19  ALT 11 12  ALKPHOS 72 87  BILITOT 0.7 0.5  PROT 5.8* 6.7  ALBUMIN 2.5* 2.8*   No results for input(s): LIPASE, AMYLASE in the last 168 hours. No results for input(s): AMMONIA in the last 168 hours. CBC: Recent Labs  Lab 08/15/18 1747 08/16/18 0551 08/17/18 0832  WBC 8.0 6.8 5.0  NEUTROABS 6.3  --  3.0  HGB 11.9* 11.0* 12.2  HCT 36.3 34.8* 38.8  MCV 93.1 92.6 93.0  PLT 186 162 186   Cardiac Enzymes: Recent Labs  Lab 08/15/18 1545  TROPONINI <0.03   BNP (last 3 results) No results for input(s): BNP in the last 8760 hours.  ProBNP (last 3 results) No results for input(s): PROBNP in the last 8760 hours.  CBG: No results for input(s): GLUCAP in the last 168 hours.  Recent Results (from the past 240 hour(s))  Blood culture (routine x 2)     Status: None (Preliminary result)   Collection Time: 08/15/18  4:13 PM  Result Value Ref Range Status   Specimen Description BLOOD LEFT WRIST  Final   Special Requests   Final    BOTTLES DRAWN AEROBIC ONLY Blood Culture results may not be optimal due to an inadequate volume of blood received in culture bottles   Culture   Final    NO GROWTH < 24 HOURS Performed at [facility] [address] [place], [place] [address]    Report Status PENDING  Incomplete  Blood culture (routine x 2)     Status: None (Preliminary result)   Collection Time: 08/15/18  4:25 PM  Result Value Ref Range Status   Specimen Description SITE NOT SPECIFIED  Final   Special Requests   Final    BOTTLES DRAWN AEROBIC ONLY Blood Culture results may not be optimal due to an inadequate volume of blood received in culture bottles   Culture   Final    NO GROWTH < 24 HOURS Performed at  [facility] [address], [place], [place] [address]    Report Status PENDING  Incomplete  Urine culture     Status: Abnormal   Collection Time: 08/15/18  4:40 PM  Result Value Ref Range Status   Specimen Description URINE, CLEAN CATCH  Final   Special Requests   Final    NONE Performed at [facility], [place] [address], [place], [place] [address]    Culture >=100,000 COLONIES/mL ESCHERICHIA COLI (A)  Final   Report Status 08/17/2018 FINAL  Final   Organism ID, Bacteria ESCHERICHIA COLI (A)  Final      Susceptibility   Escherichia coli - MIC*    AMPICILLIN >=32 RESISTANT Resistant     CEFAZOLIN <=4 SENSITIVE Sensitive     CEFTRIAXONE <=1 SENSITIVE Sensitive     CIPROFLOXACIN <=0.25 SENSITIVE Sensitive  GENTAMICIN >=16 RESISTANT Resistant     IMIPENEM <=0.25 SENSITIVE Sensitive     NITROFURANTOIN <=16 SENSITIVE Sensitive     TRIMETH/SULFA >=320 RESISTANT Resistant     AMPICILLIN/SULBACTAM >=32 RESISTANT Resistant     PIP/TAZO <=4 SENSITIVE Sensitive     Extended ESBL NEGATIVE Sensitive     * >=100,000 COLONIES/mL ESCHERICHIA COLI  Respiratory Panel by PCR     Status: None   Collection Time: 08/15/18  8:10 PM  Result Value Ref Range Status   Adenovirus NOT DETECTED NOT DETECTED Final   Coronavirus 229E NOT DETECTED NOT DETECTED Final    Comment: (NOTE) The Coronavirus on the Respiratory Panel, DOES NOT test for the novel  Coronavirus (2019 nCoV)    Coronavirus HKU1 NOT DETECTED NOT DETECTED Final   Coronavirus NL63 NOT DETECTED NOT DETECTED Final   Coronavirus OC43 NOT DETECTED NOT DETECTED Final   Metapneumovirus NOT DETECTED NOT DETECTED Final   Rhinovirus / Enterovirus NOT DETECTED NOT DETECTED Final   Influenza A NOT DETECTED NOT DETECTED Final   Influenza B NOT DETECTED NOT DETECTED Final   Parainfluenza Virus 1 NOT DETECTED NOT DETECTED Final   Parainfluenza Virus 2 NOT DETECTED NOT DETECTED Final   Parainfluenza Virus 3 NOT DETECTED NOT DETECTED  Final   Parainfluenza Virus 4 NOT DETECTED NOT DETECTED Final   Respiratory Syncytial Virus NOT DETECTED NOT DETECTED Final   Bordetella pertussis NOT DETECTED NOT DETECTED Final   Chlamydophila pneumoniae NOT DETECTED NOT DETECTED Final   Mycoplasma pneumoniae NOT DETECTED NOT DETECTED Final    Comment: Performed at [facility], [place]. [address] [place], [place] [address]  MRSA PCR Screening     Status: Abnormal   Collection Time: 08/16/18  3:07 AM  Result Value Ref Range Status   MRSA by PCR POSITIVE (A) NEGATIVE Final    Comment:        The GeneXpert MRSA Assay (FDA approved for NASAL specimens only), is one component of a comprehensive MRSA colonization surveillance program. It is not intended to diagnose MRSA infection nor to guide or monitor treatment for MRSA infections. RESULT CALLED TO, READ BACK BY AND VERIFIED WITH: COLUMBRES,S RN 0543 08/16/2018 MITCHELL,L Performed at [facility], [place] [address], [place], [place] [address]      Studies: Dg Chest Port 1 View  Result Date: 08/15/2018 CLINICAL DATA:  Fever. Pt in droplet and contact precautions, pt wearing droplet mask, tech wearing gown, gloves, droplet mask, buffont. EXAM: PORTABLE CHEST 1 VIEW COMPARISON:  07/31/2017 and older studies. FINDINGS: Cardiac silhouette is normal size. No mediastinal masses. There is prominence of the right hilum which appears to be vascular and is stable from multiple prior studies a back several years. No convincing hilar masses or enlarged lymph nodes. Mild scarring in the upper lobes, right greater than left, stable. Lungs otherwise clear. No pleural effusion or pneumothorax. Skeletal structures are grossly intact. There stable changes from prior right breast surgery. IMPRESSION: No active disease. Electronically Signed   By: Lajean Manes M.D.   On: 08/15/2018 16:15    Scheduled Meds: . aspirin EC  81 mg Oral Daily  . atorvastatin  20 mg Oral q1800  . Chlorhexidine Gluconate  Cloth  6 each Topical Q0600  . clopidogrel  75 mg Oral Daily  . docusate sodium  100 mg Oral BID  . enoxaparin (LOVENOX) injection  40 mg Subcutaneous QHS  . escitalopram  20 mg Oral Daily  . ezetimibe  10  mg Oral QHS  . famotidine  20 mg Oral QHS  . feeding supplement (ENSURE ENLIVE)  237 mL Oral BID BM  . gabapentin  600 mg Oral BID  . magnesium oxide  800 mg Oral BID  . mupirocin ointment  1 application Nasal BID  . pantoprazole  40 mg Oral Daily  . polyvinyl alcohol  1 drop Both Eyes QID  . potassium chloride  40 mEq Oral TID   Continuous Infusions: . azithromycin    . cefTRIAXone (ROCEPHIN)  IV 1 g (08/16/18 2146)  . lactated ringers 50 mL/hr at 08/16/18 1421    Principal Problem:   Hypokalemia Active Problems:   Hypertension   GERD (gastroesophageal reflux disease)   CAD (coronary artery disease), native coronary artery   Hypomagnesemia   Fever      Desiree Hane  [facility]

## 2018-08-18 DIAGNOSIS — R8271 Bacteriuria: Secondary | ICD-10-CM

## 2018-08-18 DIAGNOSIS — R6889 Other general symptoms and signs: Secondary | ICD-10-CM

## 2018-08-18 LAB — CBC WITH DIFFERENTIAL/PLATELET
Abs Immature Granulocytes: 0.02 10*3/uL (ref 0.00–0.07)
Basophils Absolute: 0 10*3/uL (ref 0.0–0.1)
Basophils Relative: 0 %
Eosinophils Absolute: 0.4 10*3/uL (ref 0.0–0.5)
Eosinophils Relative: 6 %
HCT: 38.2 % (ref 36.0–46.0)
Hemoglobin: 12.6 g/dL (ref 12.0–15.0)
Immature Granulocytes: 0 %
Lymphocytes Relative: 19 %
Lymphs Abs: 1.2 10*3/uL (ref 0.7–4.0)
MCH: 30.6 pg (ref 26.0–34.0)
MCHC: 33 g/dL (ref 30.0–36.0)
MCV: 92.7 fL (ref 80.0–100.0)
Monocytes Absolute: 0.7 10*3/uL (ref 0.1–1.0)
Monocytes Relative: 11 %
Neutro Abs: 4.1 10*3/uL (ref 1.7–7.7)
Neutrophils Relative %: 64 %
Platelets: 202 10*3/uL (ref 150–400)
RBC: 4.12 MIL/uL (ref 3.87–5.11)
RDW: 14.9 % (ref 11.5–15.5)
WBC: 6.5 10*3/uL (ref 4.0–10.5)
nRBC: 0 % (ref 0.0–0.2)

## 2018-08-18 LAB — COMPREHENSIVE METABOLIC PANEL
ALT: 13 U/L (ref 0–44)
AST: 18 U/L (ref 15–41)
Albumin: 2.6 g/dL — ABNORMAL LOW (ref 3.5–5.0)
Alkaline Phosphatase: 84 U/L (ref 38–126)
Anion gap: 10 (ref 5–15)
BUN: 13 mg/dL (ref 8–23)
CO2: 30 mmol/L (ref 22–32)
Calcium: 8.8 mg/dL — ABNORMAL LOW (ref 8.9–10.3)
Chloride: 98 mmol/L (ref 98–111)
Creatinine, Ser: 0.87 mg/dL (ref 0.44–1.00)
GFR calc Af Amer: >60 mL/min (ref >60)
GFR calc non Af Amer: >60 mL/min (ref >60)
Glucose, Bld: 110 mg/dL — ABNORMAL HIGH (ref 70–99)
Potassium: 4 mmol/L (ref 3.5–5.1)
Sodium: 138 mmol/L (ref 135–145)
Total Bilirubin: 0.5 mg/dL (ref 0.3–1.2)
Total Protein: 6.9 g/dL (ref 6.5–8.1)

## 2018-08-18 LAB — NOVEL CORONAVIRUS, NAA (HOSP ORDER, SEND-OUT TO REF LAB; TAT 18-24 HRS): SARS-CoV-2, NAA: NOT DETECTED

## 2018-08-18 MED ORDER — POTASSIUM CHLORIDE ER 10 MEQ PO TBCR: 20.0000 meq | EXTENDED_RELEASE_TABLET | Freq: Every day | ORAL | 0 refills | Status: DC

## 2018-08-18 MED ORDER — CEFPODOXIME PROXETIL 200 MG PO TABS: 200.0000 mg | ORAL_TABLET | Freq: Two times a day (BID) | ORAL | 0 refills | Status: DC

## 2018-08-18 MED ORDER — POTASSIUM CHLORIDE ER 10 MEQ PO TBCR: 10.0000 meq | EXTENDED_RELEASE_TABLET | ORAL | 0 refills | Status: DC

## 2018-08-18 NOTE — Progress Notes (Signed)
Report given to [facility] .Transferred to  5 MW via bed in fair condition .

## 2018-08-18 NOTE — Progress Notes (Signed)
Angela Bond to be D/C'd Home per MD order.  Discussed prescriptions and follow up appointments with the patient. Prescriptions given to patient, medication list explained in detail. Pt verbalized understanding.  Allergies as of 08/18/2018      Reactions   Shellfish Allergy Anaphylaxis, Other (See Comments)   ANAPHYLAXIS TO OYSTERS   Aspirin Other (See Comments)   Burns stomach   Penicillins Nausea And Vomiting, Other (See Comments)   PATIENT HAS HAD A PCN REACTION WITH IMMEDIATE RASH, FACIAL/TONGUE/THROAT SWELLING, SOB, OR LIGHTHEADEDNESS WITH HYPOTENSION:  #  #  YES  #  #  Has patient had a PCN reaction causing severe rash involving mucus membranes or skin necrosis: No Has patient had a PCN reaction that required hospitalization: No Has patient had a PCN reaction occurring within the last 10 years: No If all of the above answers are "NO", then may proceed with Cephalosporin use.   Aleve [naproxen] Nausea And Vomiting   Brilinta [ticagrelor] Other (See Comments)   Indigestion   Ciprofloxacin Nausea And Vomiting   Doxycycline Nausea And Vomiting   Evista [raloxifene] Other (See Comments)   LEG ACHES   Fosamax [alendronate Sodium] Other (See Comments)   Leg aches   Lactose Intolerance (gi) Nausea And Vomiting   Latex Rash   Naproxen Sodium Nausea And Vomiting   Nitrofurantoin Rash, Other (See Comments)   "I broke out down there"   Pravachol [pravastatin] Other (See Comments)   GI upset   Sulfa Antibiotics Nausea And Vomiting   Tape Rash   Welchol [colesevelam Hcl] Other (See Comments)   Stomach cramps   Zocor [simvastatin] Rash      Medication List    TAKE these medications   acetaminophen 500 MG tablet Commonly known as:  TYLENOL Take 1,000 mg by mouth every 6 (six) hours as needed for moderate pain or headache.   aspirin 81 MG EC tablet Take 1 tablet (81 mg total) by mouth daily.   cefpodoxime 200 MG tablet Commonly known as:  VANTIN Take 1 tablet (200 mg total) by  mouth 2 (two) times daily. For 2 more days   clopidogrel 75 MG tablet Commonly known as:  PLAVIX TAKE 1 TABLET BY MOUTH  DAILY   dexlansoprazole 60 MG capsule Commonly known as:  DEXILANT Take 60 mg by mouth daily.   diphenhydrAMINE-zinc acetate cream Commonly known as:  BENADRYL Apply 1 application topically at bedtime as needed for itching.   escitalopram 20 MG tablet Commonly known as:  LEXAPRO Take 20 mg by mouth daily.   ezetimibe 10 MG tablet Commonly known as:  ZETIA Take 1 tablet (10 mg total) by mouth at bedtime.   fexofenadine 180 MG tablet Commonly known as:  ALLEGRA Take 180 mg by mouth daily as needed for allergies.   furosemide 20 MG tablet Commonly known as:  LASIX Take 1 tablet (20 mg total) by mouth every other day.   gabapentin 600 MG tablet Commonly known as:  Neurontin Take 1 tablet (600 mg total) by mouth 3 (three) times daily. What changed:  when to take this   lovastatin 40 MG tablet Commonly known as:  MEVACOR TAKE 1 TABLET BY MOUTH AT  BEDTIME   Magnesium 500 MG Tabs Take 500 mg by mouth at bedtime.   meclizine 25 MG tablet Commonly known as:  ANTIVERT Take 12.5-25 mg by mouth 3 (three) times daily as needed for dizziness.   nitroGLYCERIN 0.4 MG SL tablet Commonly known as:  NITROSTAT Place  1 tablet (0.4 mg total) under the tongue every 5 (five) minutes as needed for chest pain (CP or SOB).   nystatin cream Commonly known as:  MYCOSTATIN Apply 1 application topically 2 (two) times daily.   potassium chloride 10 MEQ tablet Commonly known as:  K-DUR Take 1 tablet (10 mEq total) by mouth every other day. Take with Lasix only   ranitidine 150 MG tablet Commonly known as:  ZANTAC Take 150 mg by mouth at bedtime.   Refresh Tears 0.5 % Soln Generic drug:  carboxymethylcellulose Place 1 drop into both eyes 4 (four) times daily.   sodium chloride 0.65 % Soln nasal spray Commonly known as:  OCEAN Place 2 sprays into both nostrils as  needed for congestion.   tiZANidine 2 MG tablet Commonly known as:  ZANAFLEX Take 2-4 mg by mouth every 8 (eight) hours as needed for muscle spasms.   vitamin C 500 MG tablet Commonly known as:  ASCORBIC ACID Take 500 mg by mouth daily.   Vitamin D 50 MCG (2000 UT) tablet Take 2,000 Units by mouth 2 (two) times daily.       Vitals:   08/18/18 0428 08/18/18 0902  BP: 123/67 118/64  Pulse: (!) 59 66  Resp: 19 18  Temp: 98.5 F (36.9 C) 98.5 F (36.9 C)  SpO2: 99% 100%    Skin clean, dry and intact without evidence of skin break down, no evidence of skin tears noted. IV catheter discontinued intact. Site without signs and symptoms of complications. Dressing and pressure applied. Pt denies pain at this time. No complaints noted.  An After Visit Summary was printed and given to the patient. Patient escorted via [place], and D/C home via private auto.

## 2018-08-18 NOTE — Discharge Summary (Signed)
Physician Discharge Summary  Angela Bond PPI:[id number] DOB: April 11, 1946 DOA: 08/15/2018  PCP: Gaynelle Arabian, MD  Admit date: 08/15/2018 Discharge date: 08/18/2018  Admitted From: home Disposition:  home  Recommendations for Outpatient Follow-up:  1. Follow up with PCP in 1-2 weeks as needed, televisit  Home Health: none Equipment/Devices: none  Discharge Condition: stable CODE STATUS: Full code Diet recommendation: regular  HPI: Per admitting MD, Angela Bond is a 73 y.o. female with medical history significant of CAD w stenting, essential hypertension, unsteady gait, GERD.  She lives at home and provides the history herself. The patient was grocery shopping and while checking out, she began to feel lightheaded, flushed, nauseous, and sweaty.  This sensation lasted for around 15 minutes before resolving.  She has had continued weakness since then.  She denies any recent travel, upper respiratory symptoms, shortness of breath, cough, abdominal pain, diarrhea, nausea/vomiting outside of the above episode, urinary complaints, or rashes.  She did not even feel feverish until coming to the emergency department.  Patient states she is very hungry and is eating and drinking normally prior to this.  Of note, she is on Lasix every other day.  Her potassium has always been within normal limits without taking supplemental potassium with a diuretic.  She also takes a magnesium supplement daily. ED Course: Potassium found to be 2.1.  Magnesium 1.3.  Repletion started for both.  Chest x-ray unremarkable.  Urinalysis/microscopy shows some bacteria without bleeding.  Lactic acid 2.4. EKG without acute changes suggestive of hypokalemia.  Hospital Course:  Symptomatic hypokalemia and hypomagnesemia -likely in the setting of home Lasix, this was repleted and has normalized.  Patient's symptoms have resolved, she is back to baseline, and will be sent home in stable condition.  She will be placed on  potassium supplementation to take in the days that she is taking the Lasix.  Syncopal episode - ssociated with increased weakness related to symptomatic hypokalemia/hypomagnesemia, no events on telemetry and otherwise stable  Febrile illness -initially a fever of 100.6, unclear etiology but she did have some URI type symptoms.  She recently completed amoxicillin x10 days for sinusitis and has some sinus tenderness still and runny nose.  She was tested for Covid and returned negative.  She replaced for a few more days of cefpodoxime as she responded well to ceftriaxone.  Lactic acidosis, resolved   Mood disorder, stable -Continue Lexapro  Neuropathy, stable -Continue gabapentin  Asymptomatic E.coli Bacteriuria - no urinary symptoms. Ua pretty unremarkable as well  Discharge Diagnoses:  Principal Problem:   Hypokalemia Active Problems:   Hypertension   GERD (gastroesophageal reflux disease)   CAD (coronary artery disease), native coronary artery   Hypomagnesemia   Fever   Suspected Covid-19 Virus Infection   Asymptomatic bacteriuria     Discharge Instructions   Allergies as of 08/18/2018      Reactions   Shellfish Allergy Anaphylaxis, Other (See Comments)   ANAPHYLAXIS TO OYSTERS   Aspirin Other (See Comments)   Burns stomach   Penicillins Nausea And Vomiting, Other (See Comments)   PATIENT HAS HAD A PCN REACTION WITH IMMEDIATE RASH, FACIAL/TONGUE/THROAT SWELLING, SOB, OR LIGHTHEADEDNESS WITH HYPOTENSION:  #  #  YES  #  #  Has patient had a PCN reaction causing severe rash involving mucus membranes or skin necrosis: No Has patient had a PCN reaction that required hospitalization: No Has patient had a PCN reaction occurring within the last 10 years: No If all of the above  answers are "NO", then may proceed with Cephalosporin use.   Aleve [naproxen] Nausea And Vomiting   Brilinta [ticagrelor] Other (See Comments)   Indigestion   Ciprofloxacin Nausea And Vomiting    Doxycycline Nausea And Vomiting   Evista [raloxifene] Other (See Comments)   LEG ACHES   Fosamax [alendronate Sodium] Other (See Comments)   Leg aches   Lactose Intolerance (gi) Nausea And Vomiting   Latex Rash   Naproxen Sodium Nausea And Vomiting   Nitrofurantoin Rash, Other (See Comments)   "I broke out down there"   Pravachol [pravastatin] Other (See Comments)   GI upset   Sulfa Antibiotics Nausea And Vomiting   Tape Rash   Welchol [colesevelam Hcl] Other (See Comments)   Stomach cramps   Zocor [simvastatin] Rash      Medication List    TAKE these medications   acetaminophen 500 MG tablet Commonly known as:  TYLENOL Take 1,000 mg by mouth every 6 (six) hours as needed for moderate pain or headache.   aspirin 81 MG EC tablet Take 1 tablet (81 mg total) by mouth daily.   cefpodoxime 200 MG tablet Commonly known as:  VANTIN Take 1 tablet (200 mg total) by mouth 2 (two) times daily. For 2 more days   clopidogrel 75 MG tablet Commonly known as:  PLAVIX TAKE 1 TABLET BY MOUTH  DAILY   dexlansoprazole 60 MG capsule Commonly known as:  DEXILANT Take 60 mg by mouth daily.   diphenhydrAMINE-zinc acetate cream Commonly known as:  BENADRYL Apply 1 application topically at bedtime as needed for itching.   escitalopram 20 MG tablet Commonly known as:  LEXAPRO Take 20 mg by mouth daily.   ezetimibe 10 MG tablet Commonly known as:  ZETIA Take 1 tablet (10 mg total) by mouth at bedtime.   fexofenadine 180 MG tablet Commonly known as:  ALLEGRA Take 180 mg by mouth daily as needed for allergies.   furosemide 20 MG tablet Commonly known as:  LASIX Take 1 tablet (20 mg total) by mouth every other day.   gabapentin 600 MG tablet Commonly known as:  Neurontin Take 1 tablet (600 mg total) by mouth 3 (three) times daily. What changed:  when to take this   lovastatin 40 MG tablet Commonly known as:  MEVACOR TAKE 1 TABLET BY MOUTH AT  BEDTIME   Magnesium 500 MG  Tabs Take 500 mg by mouth at bedtime.   meclizine 25 MG tablet Commonly known as:  ANTIVERT Take 12.5-25 mg by mouth 3 (three) times daily as needed for dizziness.   nitroGLYCERIN 0.4 MG SL tablet Commonly known as:  NITROSTAT Place 1 tablet (0.4 mg total) under the tongue every 5 (five) minutes as needed for chest pain (CP or SOB).   nystatin cream Commonly known as:  MYCOSTATIN Apply 1 application topically 2 (two) times daily.   potassium chloride 10 MEQ tablet Commonly known as:  K-DUR Take 1 tablet (10 mEq total) by mouth every other day. Take with Lasix only   ranitidine 150 MG tablet Commonly known as:  ZANTAC Take 150 mg by mouth at bedtime.   Refresh Tears 0.5 % Soln Generic drug:  carboxymethylcellulose Place 1 drop into both eyes 4 (four) times daily.   sodium chloride 0.65 % Soln nasal spray Commonly known as:  OCEAN Place 2 sprays into both nostrils as needed for congestion.   tiZANidine 2 MG tablet Commonly known as:  ZANAFLEX Take 2-4 mg by mouth every 8 (eight) hours  as needed for muscle spasms.   vitamin C 500 MG tablet Commonly known as:  ASCORBIC ACID Take 500 mg by mouth daily.   Vitamin D 50 MCG (2000 UT) tablet Take 2,000 Units by mouth 2 (two) times daily.      Follow-up Information    Gaynelle Arabian, MD Follow up.   Specialty:  Family Medicine Why:  please schedule an E-visit as soon as possible Contact information: [address] [place] [address] [phone]        Jerline Pain, MD .   Specialty:  Cardiology Contact information: [phone] [address] [place] [address] [phone]           Consultations:  None   Procedures/Studies:  Dg Chest Port 1 View  Result Date: 08/15/2018 CLINICAL DATA:  Fever. Pt in droplet and contact precautions, pt wearing droplet mask, tech wearing gown, gloves, droplet mask, buffont. EXAM: PORTABLE CHEST 1 VIEW COMPARISON:  07/31/2017 and older  studies. FINDINGS: Cardiac silhouette is normal size. No mediastinal masses. There is prominence of the right hilum which appears to be vascular and is stable from multiple prior studies a back several years. No convincing hilar masses or enlarged lymph nodes. Mild scarring in the upper lobes, right greater than left, stable. Lungs otherwise clear. No pleural effusion or pneumothorax. Skeletal structures are grossly intact. There stable changes from prior right breast surgery. IMPRESSION: No active disease. Electronically Signed   By: Lajean Manes M.D.   On: 08/15/2018 16:15      Subjective: - no chest pain, shortness of breath, no abdominal pain, nausea or vomiting.   Discharge Exam: BP 118/64 (BP Location: Left Arm)   Pulse 66   Temp 98.5 F (36.9 C) (Oral)   Resp 18   Ht 4\' 8"  (1.422 m)   Wt 76.4 kg   SpO2 100%   BMI 37.76 kg/m   General: Pt is alert, awake, not in acute distress Cardiovascular: RRR, S1/S2 +, no rubs, no gallops Respiratory: CTA bilaterally, no wheezing, no rhonchi Abdominal: Soft, NT, ND, bowel sounds + Extremities: no edema, no cyanosis    The results of significant diagnostics from this hospitalization (including imaging, microbiology, ancillary and laboratory) are listed below for reference.     Microbiology: Recent Results (from the past 240 hour(s))  Blood culture (routine x 2)     Status: None (Preliminary result)   Collection Time: 08/15/18  4:13 PM  Result Value Ref Range Status   Specimen Description BLOOD LEFT WRIST  Final   Special Requests   Final    BOTTLES DRAWN AEROBIC ONLY Blood Culture results may not be optimal due to an inadequate volume of blood received in culture bottles   Culture   Final    NO GROWTH 2 DAYS Performed at [facility], [place] [address] [place], [place] [address]    Report Status PENDING  Incomplete  Blood culture (routine x 2)     Status: None (Preliminary result)   Collection Time: 08/15/18  4:25 PM   Result Value Ref Range Status   Specimen Description SITE NOT SPECIFIED  Final   Special Requests   Final    BOTTLES DRAWN AEROBIC ONLY Blood Culture results may not be optimal due to an inadequate volume of blood received in culture bottles   Culture   Final    NO GROWTH 2 DAYS Performed at [facility], [place] [address] [place], [place] [address]  Report Status PENDING  Incomplete  Urine culture     Status: Abnormal   Collection Time: 08/15/18  4:40 PM  Result Value Ref Range Status   Specimen Description URINE, CLEAN CATCH  Final   Special Requests   Final    NONE Performed at [facility], [place] [address] [place], [place] [address]    Culture >=100,000 COLONIES/mL ESCHERICHIA COLI (A)  Final   Report Status 08/17/2018 FINAL  Final   Organism ID, Bacteria ESCHERICHIA COLI (A)  Final      Susceptibility   Escherichia coli - MIC*    AMPICILLIN >=32 RESISTANT Resistant     CEFAZOLIN <=4 SENSITIVE Sensitive     CEFTRIAXONE <=1 SENSITIVE Sensitive     CIPROFLOXACIN <=0.25 SENSITIVE Sensitive     GENTAMICIN >=16 RESISTANT Resistant     IMIPENEM <=0.25 SENSITIVE Sensitive     NITROFURANTOIN <=16 SENSITIVE Sensitive     TRIMETH/SULFA >=320 RESISTANT Resistant     AMPICILLIN/SULBACTAM >=32 RESISTANT Resistant     PIP/TAZO <=4 SENSITIVE Sensitive     Extended ESBL NEGATIVE Sensitive     * >=100,000 COLONIES/mL ESCHERICHIA COLI  Respiratory Panel by PCR     Status: None   Collection Time: 08/15/18  8:10 PM  Result Value Ref Range Status   Adenovirus NOT DETECTED NOT DETECTED Final   Coronavirus 229E NOT DETECTED NOT DETECTED Final    Comment: (NOTE) The Coronavirus on the Respiratory Panel, DOES NOT test for the novel  Coronavirus (2019 nCoV)    Coronavirus HKU1 NOT DETECTED NOT DETECTED Final   Coronavirus NL63 NOT DETECTED NOT DETECTED Final   Coronavirus OC43 NOT DETECTED NOT DETECTED Final   Metapneumovirus NOT DETECTED NOT DETECTED Final   Rhinovirus /  Enterovirus NOT DETECTED NOT DETECTED Final   Influenza A NOT DETECTED NOT DETECTED Final   Influenza B NOT DETECTED NOT DETECTED Final   Parainfluenza Virus 1 NOT DETECTED NOT DETECTED Final   Parainfluenza Virus 2 NOT DETECTED NOT DETECTED Final   Parainfluenza Virus 3 NOT DETECTED NOT DETECTED Final   Parainfluenza Virus 4 NOT DETECTED NOT DETECTED Final   Respiratory Syncytial Virus NOT DETECTED NOT DETECTED Final   Bordetella pertussis NOT DETECTED NOT DETECTED Final   Chlamydophila pneumoniae NOT DETECTED NOT DETECTED Final   Mycoplasma pneumoniae NOT DETECTED NOT DETECTED Final    Comment: Performed at [facility], [place]. [address], [place], [place] [address]  Novel Coronavirus, NAA (hospital order; send-out to ref lab)     Status: None   Collection Time: 08/15/18  8:10 PM  Result Value Ref Range Status   SARS-CoV-2, NAA NOT DETECTED NOT DETECTED Final    Comment: Negative (Not Detected) results do not exclude infection caused by SARS CoV 2 and should not be used as the sole basis for treatment or other patient management decisions. Optimum specimen types and timing for peak viral levels during infections caused  by SARS CoV 2 have not been determined. Collection of multiple specimens (types and time points) from the same patient may be necessary to detect the virus. Improper specimen collection and handling, sequence variability underlying assay primers and or probes, or the presence of organisms in  quantities less than the limit of detection of the assay may lead to false negative results. Positive and negative predictive values of testing are highly dependent on prevalence. False negative results are more likely when prevalence of disease is high. (NOTE) The expected result is Negative (Not Detected).  The SARS CoV 2 test is intended for the presumptive qualitative  detection of nucleic acid from SARS CoV 2 in upper and lower  respir atory specimens. Testing methodology is  real time RT PCR. Test results must be correlated with clinical presentation and  evaluated in the context of other laboratory and epidemiologic data.  Test performance can be affected because the epidemiology and  clinical spectrum of infection caused by SARS CoV 2 is not fully  known. For example, the optimum types of specimens to collect and  when during the course of infection these specimens are most likely  to contain detectable viral RNA may not be known. This test has not been Food and Drug Administration (FDA) cleared or  approved and has been authorized by FDA under an Emergency Use  Authorization (EUA). The test is only authorized for the duration of  the declaration that circumstances exist justifying the authorization  of emergency use of in vitro diagnostic tests for detection and or  diagnosis of SARS CoV 2 under Section 564(b)(1) of the Act, 21 U.S.C.  section 7575563052 3(b)(1), unless the authorization is terminated or   revoked sooner. [facility] is certified under the  Clinical Laboratory Improvement Amendments of 1988 (CLIA), 42 U.S.C.  section [phone], to perform high complexity tests. Performed at [id number] [address], [place], [place] [address] Laboratory Director: Loleta Books, MD    Coronavirus Source NASOPHARYNGEAL  Final    Comment: Performed at [facility], [place] [address] [place], [place] [address]  MRSA PCR Screening     Status: Abnormal   Collection Time: 08/16/18  3:07 AM  Result Value Ref Range Status   MRSA by PCR POSITIVE (A) NEGATIVE Final    Comment:        The GeneXpert MRSA Assay (FDA approved for NASAL specimens only), is one component of a comprehensive MRSA colonization surveillance program. It is not intended to diagnose MRSA infection nor to guide or monitor treatment for MRSA infections. RESULT CALLED TO, READ BACK BY AND VERIFIED WITH: COLUMBRES,S RN  0543 08/16/2018 MITCHELL,L Performed at [facility], [place] [address], [place], [place] [address]      Labs: BNP (last 3 results) No results for input(s): BNP in the last 8760 hours. Basic Metabolic Panel: Recent Labs  Lab 08/15/18 1545 08/15/18 1700 08/16/18 0551 08/17/18 0832 08/18/18 0601  NA 142  --  137 137 138  K 2.1*  --  4.0 4.9 4.0  CL 109  --  104 104 98  CO2 20*  --  26 25 30   GLUCOSE 84  --  113* 118* 110*  BUN 6*  --  8 12 13   CREATININE 0.79  --  0.85 0.99 0.87  CALCIUM 6.6*  --  8.2* 8.7* 8.8*  MG  --  1.3* 2.2 2.1  --    Liver Function Tests: Recent Labs  Lab 08/15/18 1545 08/17/18 0832 08/18/18 0601  AST 16 19 18   ALT 11 12 13   ALKPHOS 72 87 84  BILITOT 0.7 0.5 0.5  PROT 5.8* 6.7 6.9  ALBUMIN 2.5* 2.8* 2.6*   No results for input(s): LIPASE, AMYLASE in the last 168 hours. No results for input(s): AMMONIA in the last 168 hours. CBC: Recent Labs  Lab 08/15/18 1747 08/16/18 0551 08/17/18 0832 08/18/18 0601  WBC 8.0 6.8 5.0 6.5  NEUTROABS 6.3  --  3.0 4.1  HGB  11.9* 11.0* 12.2 12.6  HCT 36.3 34.8* 38.8 38.2  MCV 93.1 92.6 93.0 92.7  PLT 186 162 186 202   Cardiac Enzymes: Recent Labs  Lab 08/15/18 1545  TROPONINI <0.03   BNP: Invalid input(s): POCBNP CBG: No results for input(s): GLUCAP in the last 168 hours. D-Dimer No results for input(s): DDIMER in the last 72 hours. Hgb A1c No results for input(s): HGBA1C in the last 72 hours. Lipid Profile No results for input(s): CHOL, HDL, LDLCALC, TRIG, CHOLHDL, LDLDIRECT in the last 72 hours. Thyroid function studies Recent Labs    08/16/18 1135  TSH 1.074   Anemia work up No results for input(s): VITAMINB12, FOLATE, FERRITIN, TIBC, IRON, RETICCTPCT in the last 72 hours. Urinalysis    Component Value Date/Time   COLORURINE YELLOW 08/15/2018 1640   APPEARANCEUR CLEAR 08/15/2018 1640   LABSPEC 1.009 08/15/2018 1640   LABSPEC 1.010 03/17/2015 1216   PHURINE 7.0 08/15/2018 1640    GLUCOSEU NEGATIVE 08/15/2018 1640   GLUCOSEU Negative 03/17/2015 1216   HGBUR SMALL (A) 08/15/2018 1640   BILIRUBINUR NEGATIVE 08/15/2018 1640   BILIRUBINUR Negative 03/17/2015 1216   KETONESUR NEGATIVE 08/15/2018 1640   PROTEINUR NEGATIVE 08/15/2018 1640   UROBILINOGEN 0.2 03/17/2015 1216   NITRITE NEGATIVE 08/15/2018 1640   LEUKOCYTESUR NEGATIVE 08/15/2018 1640   LEUKOCYTESUR Trace 03/17/2015 1216   Sepsis Labs Invalid input(s): PROCALCITONIN,  WBC,  LACTICIDVEN  FURTHER DISCHARGE INSTRUCTIONS:   Get Medicines reviewed and adjusted: Please take all your medications with you for your next visit with your Primary MD   Laboratory/radiological data: Please request your Primary MD to go over all hospital tests and procedure/radiological results at the follow up, please ask your Primary MD to get all Hospital records sent to his/her office.   In some cases, they will be blood work, cultures and biopsy results pending at the time of your discharge. Please request that your primary care M.D. goes through all the records of your hospital data and follows up on these results.   Also Note the following: If you experience worsening of your admission symptoms, develop shortness of breath, life threatening emergency, suicidal or homicidal thoughts you must seek medical attention immediately by calling 911 or calling your MD immediately  if symptoms less severe.   You must read complete instructions/literature along with all the possible adverse reactions/side effects for all the Medicines you take and that have been prescribed to you. Take any new Medicines after you have completely understood and accpet all the possible adverse reactions/side effects.    Do not drive when taking Pain medications or sleeping medications (Benzodaizepines)   Do not take more than prescribed Pain, Sleep and Anxiety Medications. It is not advisable to combine anxiety,sleep and pain medications without talking with  your primary care practitioner   Special Instructions: If you have smoked or chewed Tobacco  in the last 2 yrs please stop smoking, stop any regular Alcohol  and or any Recreational drug use.   Wear Seat belts while driving.   Please note: You were cared for by a hospitalist during your hospital stay. Once you are discharged, your primary care physician will handle any further medical issues. Please note that NO REFILLS for any discharge medications will be authorized once you are discharged, as it is imperative that you return to your primary care physician (or establish a relationship with a primary care physician if you do not have one) for your post hospital discharge needs so that they can  reassess your need for medications and monitor your lab values.  Time coordinating discharge: 25 minutes  SIGNED:  Marzetta Board, PA-S 08/18/2018, 11:24 AM

## 2018-08-18 NOTE — Discharge Instructions (Signed)
Follow with Gaynelle Arabian, MD in 5-7 days  Please get a complete blood count and chemistry panel checked by your Primary MD at your next visit, and again as instructed by your Primary MD. Please get your medications reviewed and adjusted by your Primary MD.  Please request your Primary MD to go over all Hospital Tests and Procedure/Radiological results at the follow up, please get all Hospital records sent to your Prim MD by signing hospital release before you go home.  In some cases, there will be blood work, cultures and biopsy results pending at the time of your discharge. Please request that your primary care M.D. goes through all the records of your hospital data and follows up on these results.  If you had Pneumonia of Lung problems at the Hospital: Please get a 2 view Chest X ray done in 6-8 weeks after hospital discharge or sooner if instructed by your Primary MD.  If you have Congestive Heart Failure: Please call your Cardiologist or Primary MD anytime you have any of the following symptoms:  1) 3 pound weight gain in 24 hours or 5 pounds in 1 week  2) shortness of breath, with or without a dry hacking cough  3) swelling in the hands, feet or stomach  4) if you have to sleep on extra pillows at night in order to breathe  Follow cardiac low salt diet and 1.5 lit/day fluid restriction.  If you have diabetes Accuchecks 4 times/day, Once in AM empty stomach and then before each meal. Log in all results and show them to your primary doctor at your next visit. If any glucose reading is under 80 or above 300 call your primary MD immediately.  If you have Seizure/Convulsions/Epilepsy: Please do not drive, operate heavy machinery, participate in activities at heights or participate in high speed sports until you have seen by Primary MD or a Neurologist and advised to do so again.  If you had Gastrointestinal Bleeding: Please ask your Primary MD to check a complete blood count within  one week of discharge or at your next visit. Your endoscopic/colonoscopic biopsies that are pending at the time of discharge, will also need to followed by your Primary MD.  Get Medicines reviewed and adjusted. Please take all your medications with you for your next visit with your Primary MD  Please request your Primary MD to go over all hospital tests and procedure/radiological results at the follow up, please ask your Primary MD to get all Hospital records sent to his/her office.  If you experience worsening of your admission symptoms, develop shortness of breath, life threatening emergency, suicidal or homicidal thoughts you must seek medical attention immediately by calling 911 or calling your MD immediately  if symptoms less severe.  You must read complete instructions/literature along with all the possible adverse reactions/side effects for all the Medicines you take and that have been prescribed to you. Take any new Medicines after you have completely understood and accpet all the possible adverse reactions/side effects.   Do not drive or operate heavy machinery when taking Pain medications.   Do not take more than prescribed Pain, Sleep and Anxiety Medications  Special Instructions: If you have smoked or chewed Tobacco  in the last 2 yrs please stop smoking, stop any regular Alcohol  and or any Recreational drug use.  Wear Seat belts while driving.  Please note You were cared for by a hospitalist during your hospital stay. If you have any questions about your discharge  medications or the care you received while you were in the hospital after you are discharged, you can call the unit and asked to speak with the hospitalist on call if the hospitalist that took care of you is not available. Once you are discharged, your primary care physician will handle any further medical issues. Please note that NO REFILLS for any discharge medications will be authorized once you are discharged, as it is  imperative that you return to your primary care physician (or establish a relationship with a primary care physician if you do not have one) for your aftercare needs so that they can reassess your need for medications and monitor your lab values.  You can reach the hospitalist office at phone [phone] or fax [phone]   If you do not have a primary care physician, you can call [phone] for a physician referral.  Activity: As tolerated with Full fall precautions use walker/cane & assistance as needed    Diet: regular  Disposition Home

## 2018-08-20 LAB — CULTURE, BLOOD (ROUTINE X 2)
Culture: NO GROWTH
Culture: NO GROWTH

## 2018-08-31 ENCOUNTER — Telehealth: Payer: Self-pay | Admitting: *Deleted

## 2018-08-31 NOTE — Telephone Encounter (Signed)
TELEPHONE CALL NOTE  This patient has been deemed a candidate for follow-up tele-health visit to limit community exposure during the Covid-19 pandemic. I spoke with the patient via phone to discuss instructions. This has been outlined on the patient's AVS (dotphrase: [email or web address]). The patient was advised to review the section on consent for treatment as well. The patient will receive a phone call 2-3 days prior to their E-Visit at which time consent will be verbally confirmed. A Virtual Office Visit appointment type has been scheduled for 10 am 4/23 with Angela Bond, with "VIDEO" or "TELEPHONE" in the appointment notes - patient prefers Video (Doximity).  I have either confirmed the patient is active in MyChart or offered to send sign-up link to phone/email via Mychart icon beside patient's photo. Pt declined  Ellwood Dense, RN 08/31/2018 1:59 PM   YOUR CARDIOLOGY TEAM HAS ARRANGED FOR AN E-VISIT FOR YOUR APPOINTMENT - PLEASE REVIEW IMPORTANT INFORMATION BELOW SEVERAL DAYS PRIOR TO YOUR APPOINTMENT  Due to the recent COVID-19 pandemic, we are transitioning in-person office visits to tele-medicine visits in an effort to decrease unnecessary exposure to our patients, their families, and staff. Medicare and most insurances are covering these visits without a copay needed. We also encourage you to sign up for MyChart if you have not already done so. You will need a smartphone if possible. For patients that do not have this, we can still complete the visit using a regular telephone but do prefer a smartphone to enable video when possible. You may have a family member that lives with you that can help. If possible, we also ask that you have a blood pressure cuff and scale at home to measure your blood pressure, heart rate and weight prior to your scheduled appointment. Patients with clinical needs that need an in-person evaluation and testing will still be able to come to the office if absolutely necessary. If  you have any questions, feel free to call our office.     YOUR PROVIDER WILL BE USING THE FOLLOWING PLATFORM TO COMPLETE YOUR VISIT: Doximity  . IF USING MYCHART - How to Download the MyChart App to Your SmartPhone   - If Apple, go to [employer] and type in MyChart in the search bar and download the app. If Android, ask patient to go to [employer] and type in [place] in the search bar and download the app. The app is free but as with any other app downloads, your phone may require you to verify saved payment information or Apple/Android password.  - You will need to then log into the app with your MyChart username and password, and select [facility] as your healthcare provider to link the account. When it is time for your visit, go to the MyChart app, find appointments, and click Begin Video Visit. Be sure to Select Allow for your device to access the Microphone and Camera for your visit. You will then be connected, and your provider will be with you shortly.  **If you have any issues connecting or need assistance, please contact MyChart service desk [phone])**  **If using a computer, in order to ensure the best quality for your visit, you will need to use either of the following Internet Browsers: [employer], or [employer]**  . IF USING DOXIMITY or [email or web address].ME - The staff will give you instructions on receiving your link to join the meeting the day of your visit.  Instructions given    2-3 DAYS BEFORE YOUR APPOINTMENT  You will receive a telephone call from one of our [facility] team members - your caller ID may say "Unknown caller." If this is a video visit, we will walk you through how to set up your device to be able to complete the visit. We will remind you check your blood pressure, heart rate and weight prior to your scheduled appointment. If you have an Apple Watch or Kardia, please upload any pertinent ECG strips the day before or morning of your  appointment to [place]. Our staff will also make sure you have reviewed the consent and agree to move forward with your scheduled tele-health visit.     THE DAY OF YOUR APPOINTMENT  Approximately 15 minutes prior to your scheduled appointment, you will receive a telephone call from one of [facility] team - your caller ID may say "Unknown caller."  Our staff will confirm medications, vital signs for the day and any symptoms you may be experiencing. Please have this information available prior to the time of visit start. It may also be helpful for you to have a pad of paper and pen handy for any instructions given during your visit. They will also walk you through joining the smartphone meeting if this is a video visit.    CONSENT FOR TELE-HEALTH VISIT - PLEASE REVIEW  I hereby voluntarily request, consent and authorize [facility] and its employed or contracted physicians, physician assistants, nurse practitioners or other licensed health care professionals (the Practitioner), to provide me with telemedicine health care services (the "Services") as deemed necessary by the treating Practitioner. I acknowledge and consent to receive the Services by the Practitioner via telemedicine. I understand that the telemedicine visit will involve communicating with the Practitioner through live audiovisual communication technology and the disclosure of certain medical information by electronic transmission. I acknowledge that I have been given the opportunity to request an in-person assessment or other available alternative prior to the telemedicine visit and am voluntarily participating in the telemedicine visit.  I understand that I have the right to withhold or withdraw my consent to the use of telemedicine in the course of my care at any time, without affecting my right to future care or treatment, and that the Practitioner or I may terminate the telemedicine visit at any time. I understand that I have the  right to inspect all information obtained and/or recorded in the course of the telemedicine visit and may receive copies of available information for a reasonable fee.  I understand that some of the potential risks of receiving the Services via telemedicine include:  Marland Kitchen Delay or interruption in medical evaluation due to technological equipment failure or disruption; . Information transmitted may not be sufficient (e.g. poor resolution of images) to allow for appropriate medical decision making by the Practitioner; and/or  . In rare instances, security protocols could fail, causing a breach of personal health information.  Furthermore, I acknowledge that it is my responsibility to provide information about my medical history, conditions and care that is complete and accurate to the best of my ability. I acknowledge that Practitioner's advice, recommendations, and/or decision may be based on factors not within their control, such as incomplete or inaccurate data provided by me or distortions of diagnostic images or specimens that may result from electronic transmissions. I understand that the practice of medicine is not an exact science and that Practitioner makes no warranties or guarantees regarding treatment outcomes. I acknowledge that I will receive a copy of this consent concurrently upon execution  via email to the email address I last provided but may also request a printed copy by calling the office of [facility].    I understand that my insurance will be billed for this visit.   I have read or had this consent read to me. . I understand the contents of this consent, which adequately explains the benefits and risks of the Services being provided via telemedicine.  . I have been provided ample opportunity to ask questions regarding this consent and the Services and have had my questions answered to my satisfaction. . I give my informed consent for the services to be provided through the use of  telemedicine in my medical care  By participating in this telemedicine visit I agree to the above. Consent verbally obtained 08/31/2018

## 2018-09-06 ENCOUNTER — Telehealth (INDEPENDENT_AMBULATORY_CARE_PROVIDER_SITE_OTHER): Payer: Medicare Other | Admitting: Cardiology

## 2018-09-06 ENCOUNTER — Other Ambulatory Visit: Payer: Self-pay

## 2018-09-06 ENCOUNTER — Encounter: Payer: Self-pay | Admitting: Cardiology

## 2018-09-06 VITALS — BP 106/75 | HR 87 | Temp 98.5°F | Ht <= 58 in | Wt 169.0 lb

## 2018-09-06 DIAGNOSIS — I252 Old myocardial infarction: Secondary | ICD-10-CM

## 2018-09-06 DIAGNOSIS — I255 Ischemic cardiomyopathy: Secondary | ICD-10-CM

## 2018-09-06 DIAGNOSIS — I2510 Atherosclerotic heart disease of native coronary artery without angina pectoris: Secondary | ICD-10-CM | POA: Diagnosis not present

## 2018-09-06 DIAGNOSIS — I209 Angina pectoris, unspecified: Secondary | ICD-10-CM

## 2018-09-06 DIAGNOSIS — E7800 Pure hypercholesterolemia, unspecified: Secondary | ICD-10-CM

## 2018-09-06 DIAGNOSIS — I951 Orthostatic hypotension: Secondary | ICD-10-CM

## 2018-09-06 DIAGNOSIS — I480 Paroxysmal atrial fibrillation: Secondary | ICD-10-CM

## 2018-09-06 MED ORDER — FUROSEMIDE 20 MG PO TABS: ORAL_TABLET | ORAL | 3 refills | Status: DC

## 2018-09-06 NOTE — Progress Notes (Signed)
Virtual Visit via Video Note   This visit type was conducted due to national recommendations for restrictions regarding the COVID-19 Pandemic (e.g. social distancing) in an effort to limit this patient's exposure and mitigate transmission in our community.  Due to her co-morbid illnesses, this patient is at least at moderate risk for complications without adequate follow up.  This format is felt to be most appropriate for this patient at this time.  All issues noted in this document were discussed and addressed.  A limited physical exam was performed with this format.  Please refer to the patient's chart for her consent to telehealth for [facility].   Evaluation Performed:  Follow-up visit  Date:  09/06/2018   ID:  Ronnetta, Currington [birth year], MRN [id number]  Patient Location: Home Provider Location: Home  PCP:  Gaynelle Arabian, MD  Cardiologist:  Candee Furbish, MD  Electrophysiologist:  None   Chief Complaint: Hospital follow-up hypokalemia  History of Present Illness:    DESTINY HAGIN is a 73 y.o. female with recent discharge from hospital 08/18/2018 with severe hypokalemia likely in the setting of home Lasix.  She was placed on supplements to take on the days that she is taking Lasix.  She had a syncopal episode that was associated with weakness related to symptomatic hypokalemia.  No events on telemetry.  Could have been vagal also mediated.  Nausea sweating surrounding the event.  She was taking Lasix every other day.  Her potassium and always been within normal limits.  When she came to the ED her potassium was 2.1.  Chest x-ray was normal.  EKG showed no changes.  She also had a fever, COVID negative.  Ceftriaxone was given.  She has coronary artery disease status post a non-STEMI with drug-eluting stent to her LAD in 2017, jailed diagonal was dilated.  Previous orthostatic hypotension noted, right breast cancer GERD hypertension hyperlipidemia.  Had had previously several  medication intolerances such as metoprolol secondary to dizziness.  Mild dizzy at times when take lasix. Allergies bad this year.   The patient does not have symptoms concerning for COVID-19 infection (fever, chills, cough, or new shortness of breath).    Past Medical History:  Diagnosis Date  . Anemia    hx  . Anxiety and depression   . Aortic atherosclerosis ([place]) 07/31/2017  . Arthritis    "~ all over my body; in the joints"  . Breast cancer, right ([place]) 3/16  . Coronary artery disease    a.  s/p NSTEMI in June 2017 with DES to mid LAD.  Marland Kitchen Depression   . Gallstones   . GERD (gastroesophageal reflux disease)   . H/O hiatal hernia   . Headache   . Heart murmur   . History of blood transfusion    "when taking chemo & w/one of my knee replacements" (11/05/2015)  . Hypercholesteremia   . Hypertension   . Iron deficiency anemia 02/16/2017  . Multiple bruises   . Myocardial infarction ([place])   . Orthostatic hypotension   . Personal history of chemotherapy   . Personal history of radiation therapy   . Pneumonia X 1  . Shingles 5/15   hx  . Sliding hiatal hernia s/p lap PEH repair 07/31/2012  . Unstable angina ([place]) 01/23/2012  . Unsteady gait    " I fall easily"  . Wears dentures    partial  . Wears glasses    Past Surgical History:  Procedure Laterality Date  . BREAST  BIOPSY Right   . BREAST LUMPECTOMY Right 08/2014  . BREAST LUMPECTOMY WITH SENTINEL LYMPH NODE BIOPSY Right 09/22/2014   Procedure: RIGHT BREAST LUMPECTOMY WITH NEEDLE LOCALIZATION AND RIGHT AXILLARY SENTINEL LYMPH NODE BIOPSY;  Surgeon: [name], MD;  Location: [facility];  Service: General;  Laterality: Right;  . CARDIAC CATHETERIZATION    . CARDIAC CATHETERIZATION N/A 11/05/2015   Procedure: Left Heart Cath and Coronary Angiography;  Surgeon: Belva Crome, MD;  Location: [place] [facility];  Service: Cardiovascular;  Laterality: N/A;  . CARDIAC CATHETERIZATION N/A 11/05/2015   Procedure: Coronary Stent  Intervention;  Surgeon: Belva Crome, MD;  Location: [place] [facility];  Service: Cardiovascular;  Laterality: N/A;  . CARPAL TUNNEL RELEASE Right 09/2015  . CHOLECYSTECTOMY N/A 03/28/2018   Procedure: LAPAROSCOPIC CHOLECYSTECTOMY WITH INTRAOPERATIVE CHOLANGIOGRAM ERAS PATHWAY;  Surgeon: Jovita Kussmaul, MD;  Location: [facility];  Service: General;  Laterality: N/A;  . CORONARY ANGIOPLASTY WITH STENT PLACEMENT  11/05/2015   "1 stent"  . ESOPHAGEAL MANOMETRY N/A 07/02/2012   Procedure: ESOPHAGEAL MANOMETRY (EM);  Surgeon: Lear Ng, MD;  Location: [facility];  Service: Endoscopy;  Laterality: N/A;  schooler to read  . EYE SURGERY Left 2013   tube placed tear duct  . HIATAL HERNIA REPAIR N/A 08/10/2012   Procedure: LAPAROSCOPIC REPAIR OF HIATAL HERNIA WITH MESH AND EGD WITH PEG TUBE PLACEMENT;  Surgeon: Ralene Ok, MD;  Location: [facility];  Service: General;  Laterality: N/A;  . LEFT HEART CATH AND CORONARY ANGIOGRAPHY N/A 08/01/2017   Procedure: LEFT HEART CATH AND CORONARY ANGIOGRAPHY;  Surgeon: Jettie Booze, MD;  Location: [place] [facility];  Service: Cardiovascular;  Laterality: N/A;  . PORT-A-CATH REMOVAL  06/2015  . PORTACATH PLACEMENT Left 09/22/2014   Procedure: INSERTION PORT-A-CATH;  Surgeon: [name], MD;  Location: [place] [facility];  Service: General;  Laterality: Left;  . POSTERIOR LUMBAR FUSION  2016   "had 2 vertebrae fused"  . TONSILLECTOMY  as child  . TOTAL KNEE ARTHROPLASTY Bilateral 2011,2012     Current Meds  Medication Sig  . acetaminophen (TYLENOL) 500 MG tablet Take 1,000 mg by mouth every 6 (six) hours as needed for moderate pain or headache.  Marland Kitchen aspirin EC 81 MG EC tablet Take 1 tablet (81 mg total) by mouth daily.  . carboxymethylcellulose (REFRESH TEARS) 0.5 % SOLN Place 1 drop into both eyes 4 (four) times daily.  . Cholecalciferol (VITAMIN D) 2000 UNITS tablet Take 2,000 Units by mouth 2 (two) times daily.   . clopidogrel (PLAVIX) 75 MG tablet TAKE 1  TABLET BY MOUTH  DAILY  . dexlansoprazole (DEXILANT) 60 MG capsule Take 60 mg by mouth daily.  . diphenhydrAMINE-zinc acetate (BENADRYL) cream Apply 1 application topically at bedtime as needed for itching.  . escitalopram (LEXAPRO) 20 MG tablet Take 20 mg by mouth daily.  Marland Kitchen ezetimibe (ZETIA) 10 MG tablet Take 1 tablet (10 mg total) by mouth at bedtime.  . famotidine (PEPCID) 20 MG tablet Take 20 mg by mouth daily.  . fexofenadine (ALLEGRA) 180 MG tablet Take 180 mg by mouth daily as needed for allergies.   . furosemide (LASIX) 20 MG tablet Take one tablet daily as needed for 3 lbs over night weight gain  . gabapentin (NEURONTIN) 600 MG tablet Take 1 tablet (600 mg total) by mouth 3 (three) times daily. (Patient taking differently: Take 600 mg by mouth 2 (two) times daily. )  . KLOR-CON M10 10 MEQ tablet  Take 10 mEq by mouth as needed. Take 1 tablet daily with Furosemide   . LORazepam (ATIVAN) 1 MG tablet Take 1 mg by mouth daily.  Marland Kitchen lovastatin (MEVACOR) 40 MG tablet TAKE 1 TABLET BY MOUTH AT  BEDTIME  . Magnesium 500 MG TABS Take 500 mg by mouth at bedtime.  . meclizine (ANTIVERT) 25 MG tablet Take 12.5-25 mg by mouth 3 (three) times daily as needed for dizziness.  . montelukast (SINGULAIR) 10 MG tablet Take 10 mg by mouth at bedtime.  . nitroGLYCERIN (NITROSTAT) 0.4 MG SL tablet Place 1 tablet (0.4 mg total) under the tongue every 5 (five) minutes as needed for chest pain (CP or SOB).  Marland Kitchen nystatin cream (MYCOSTATIN) Apply 1 application topically 2 (two) times daily.  . potassium chloride (K-DUR) 10 MEQ tablet Take 1 tablet (10 mEq total) by mouth every other day. Take with Lasix only  . sodium chloride (OCEAN) 0.65 % SOLN nasal spray Place 2 sprays into both nostrils as needed for congestion.  Marland Kitchen tiZANidine (ZANAFLEX) 2 MG tablet Take 2-4 mg by mouth every 8 (eight) hours as needed for muscle spasms.  . vitamin C (ASCORBIC ACID) 500 MG tablet Take 500 mg by mouth daily.  . [DISCONTINUED]  furosemide (LASIX) 20 MG tablet Take 1 tablet (20 mg total) by mouth every other day.     Allergies:   Shellfish allergy; Aspirin; Penicillins; Aleve [naproxen]; Brilinta [ticagrelor]; Ciprofloxacin; Doxycycline; Evista [raloxifene]; Fosamax [alendronate sodium]; Lactose intolerance (gi); Latex; Naproxen sodium; Nitrofurantoin; Pravachol [pravastatin]; Sulfa antibiotics; Tape; Welchol [colesevelam hcl]; and Zocor [simvastatin]   Social History   Tobacco Use  . Smoking status: Never Smoker  . Smokeless tobacco: Never Used  Substance Use Topics  . Alcohol use: No  . Drug use: No     Family Hx: The patient's family history includes Cancer in her father; Cancer (age of onset: 34) in her sister; Diabetes in her father and sister; Heart disease in her father, mother, and sister.  ROS:   Please see the history of present illness.    Minimal dizziness, no chest pain no shortness of breath no fevers.  Bad allergies. All other systems reviewed and are negative.   Prior CV studies:   The following studies were reviewed today:  2019 - Normal 60%  Labs/Other Tests and Data Reviewed:    EKG:  An ECG dated 08/15/2018 was personally reviewed today and demonstrated:  Sinus rhythm no other changes 74.  Recent Labs: 08/16/2018: TSH 1.074 08/17/2018: Magnesium 2.1 08/18/2018: ALT 13; BUN 13; Creatinine, Ser 0.87; Hemoglobin 12.6; Platelets 202; Potassium 4.0; Sodium 138   Recent Lipid Panel Lab Results  Component Value Date/Time   CHOL 153 08/01/2017 03:55 AM   CHOL 152 03/21/2017 09:35 AM   TRIG 63 08/01/2017 03:55 AM   HDL 59 08/01/2017 03:55 AM   HDL 66 03/21/2017 09:35 AM   CHOLHDL 2.6 08/01/2017 03:55 AM   LDLCALC 81 08/01/2017 03:55 AM   LDLCALC 69 03/21/2017 09:35 AM    Wt Readings from Last 3 Encounters:  09/06/18 169 lb (76.7 kg)  08/15/18 168 lb 6.9 oz (76.4 kg)  05/18/18 180 lb 4.8 oz (81.8 kg)     Objective:    Vital Signs:  BP 106/75   Pulse 87   Temp 98.5 F (36.9 C)    Ht 4\' 8"  (1.422 m)   Wt 169 lb (76.7 kg)   SpO2 93%   BMI 37.89 kg/m    VITAL SIGNS:  reviewed  GEN:  no acute distress EYES:  sclerae anicteric, EOMI - Extraocular Movements Intact RESPIRATORY:  normal respiratory effort, symmetric expansion CARDIOVASCULAR:  no peripheral edema SKIN:  no rash, lesions or ulcers. MUSCULOSKELETAL:  no obvious deformities. NEURO:  alert and oriented x 3, no obvious focal deficit PSYCH:  normal affect  ASSESSMENT & PLAN:    Syncope in the setting of severe hypokalemia and Lasix use - She is feeling much better.  She has been taking the Lasix every other day low-dose with 10 mEq of potassium.  She does state that sometimes after she takes the Lasix she feels a little bit dizzy.  Her EF is normal.  Her leg swelling is resolved.  I would like to have her only take the Lasix on an as-needed basis, for instance if her weight goes up by 3 pounds or if she starts to notice increased leg swelling.  Otherwise, continue with conservative management strategies.  She also thinks that her cramping in her legs was better when she took the potassium.  I advised her not to take the potassium at all times even though she has normal renal function.  She can take an isolated potassium if she is feeling some cramping however.  Hydrate well.  Coronary artery disease  -Doing well stable post LAD stent.  No anginal symptoms.  Lower extremity edema - Improved/resolved.  See above for details.  Stopping every other day Lasix.  Hyperlipidemia -Continue with statin.  No changes made.  COVID-19 Education: The signs and symptoms of COVID-19 were discussed with the patient and how to seek care for testing (follow up with PCP or arrange E-visit).  The importance of social distancing was discussed today.  Time:   Today, I have spent 15 minutes with the patient with telehealth technology discussing the above problems.     Medication Adjustments/Labs and Tests Ordered: Current  medicines are reviewed at length with the patient today.  Concerns regarding medicines are outlined above.   Tests Ordered: No orders of the defined types were placed in this encounter.   Medication Changes: Meds ordered this encounter  Medications  . furosemide (LASIX) 20 MG tablet    Sig: Take one tablet daily as needed for 3 lbs over night weight gain    Dispense:  30 tablet    Refill:  3    Disposition:  Follow up in 3 month(s)  Signed, Candee Furbish, MD  09/06/2018 10:57 AM    [facility]

## 2018-09-06 NOTE — Patient Instructions (Signed)
Medication Instructions:  Please take Furosemide once daily as needed for 3 lb over night weight gain.  Take Potassium Chloride 10 MEQ on these days. Continue all other medications as listed.  If you need a refill on your cardiac medications before your next appointment, please call your pharmacy.   Follow-Up: Follow up in 3 months with Truitt Merle, NP.  Thank you for choosing [facility]!!

## 2018-09-07 ENCOUNTER — Other Ambulatory Visit: Payer: Self-pay | Admitting: Cardiology

## 2018-09-07 MED ORDER — POTASSIUM CHLORIDE ER 10 MEQ PO TBCR: 10.0000 meq | EXTENDED_RELEASE_TABLET | ORAL | 3 refills | Status: DC

## 2018-09-07 MED ORDER — FUROSEMIDE 20 MG PO TABS: ORAL_TABLET | ORAL | 3 refills | Status: DC

## 2018-09-07 MED ORDER — EZETIMIBE 10 MG PO TABS: 10.0000 mg | ORAL_TABLET | Freq: Every day | ORAL | 3 refills | Status: DC

## 2018-09-07 MED ORDER — NITROGLYCERIN 0.4 MG SL SUBL: 0.4000 mg | SUBLINGUAL_TABLET | SUBLINGUAL | 6 refills | Status: AC | PRN

## 2018-09-07 MED ORDER — CLOPIDOGREL BISULFATE 75 MG PO TABS: 75.0000 mg | ORAL_TABLET | Freq: Every day | ORAL | 3 refills | Status: DC

## 2018-09-07 NOTE — Telephone Encounter (Signed)
Pt's medications were sent to pt's pharmacy as requested. Confirmation received.  

## 2018-10-18 ENCOUNTER — Other Ambulatory Visit: Payer: Self-pay

## 2018-10-18 MED ORDER — GABAPENTIN 600 MG PO TABS: 600.0000 mg | ORAL_TABLET | Freq: Three times a day (TID) | ORAL | 0 refills | Status: DC

## 2018-10-18 NOTE — Telephone Encounter (Signed)
Pharmacy refill request for Gabapentin 600mg   Per Dr. Milinda Pointer, ok to refill once and needs appt.   Script has been sent to pharmacy

## 2018-10-23 ENCOUNTER — Other Ambulatory Visit: Payer: Self-pay | Admitting: Hematology and Oncology

## 2018-10-23 DIAGNOSIS — Z853 Personal history of malignant neoplasm of breast: Secondary | ICD-10-CM

## 2018-11-06 ENCOUNTER — Other Ambulatory Visit: Payer: Self-pay

## 2018-11-06 ENCOUNTER — Ambulatory Visit
Admission: RE | Admit: 2018-11-06 | Discharge: 2018-11-06 | Disposition: A | Discharge status: Home / Self Care | Payer: Medicare Other | Source: Ambulatory Visit | Attending: Hematology and Oncology | Admitting: Hematology and Oncology

## 2018-11-06 DIAGNOSIS — Z853 Personal history of malignant neoplasm of breast: Secondary | ICD-10-CM

## 2018-11-15 NOTE — Assessment & Plan Note (Signed)
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 show 3% iron saturation and ferritin of 10 with an MCV of 67 and a hemoglobin of 8.1. Patient received blood transfusion at [facility] given on 02/17/2017, 11/22/2017, January 2020  Lab review  Return to clinic in 6 months with labs and follow-up

## 2018-11-15 NOTE — Assessment & Plan Note (Signed)
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  Breast exam 05/18/2018: Benign 2.  Mammogram and ultrasound 10/17/2018 no mammographic evidence of malignancy, breast density category C

## 2018-11-19 ENCOUNTER — Other Ambulatory Visit: Payer: Self-pay

## 2018-11-19 DIAGNOSIS — D509 Iron deficiency anemia, unspecified: Secondary | ICD-10-CM

## 2018-11-20 ENCOUNTER — Encounter: Payer: Self-pay | Admitting: General Practice

## 2018-11-20 ENCOUNTER — Inpatient Hospital Stay: Payer: Medicare Other | Attending: Hematology and Oncology

## 2018-11-20 ENCOUNTER — Other Ambulatory Visit: Payer: Self-pay

## 2018-11-20 DIAGNOSIS — E876 Hypokalemia: Secondary | ICD-10-CM | POA: Diagnosis not present

## 2018-11-20 DIAGNOSIS — D509 Iron deficiency anemia, unspecified: Secondary | ICD-10-CM | POA: Insufficient documentation

## 2018-11-20 DIAGNOSIS — Z853 Personal history of malignant neoplasm of breast: Secondary | ICD-10-CM | POA: Insufficient documentation

## 2018-11-20 LAB — CBC WITH DIFFERENTIAL (CANCER CENTER ONLY)
Abs Immature Granulocytes: 0.02 10*3/uL (ref 0.00–0.07)
Basophils Absolute: 0 10*3/uL (ref 0.0–0.1)
Basophils Relative: 1 %
Eosinophils Absolute: 0.3 10*3/uL (ref 0.0–0.5)
Eosinophils Relative: 5 %
HCT: 36.7 % (ref 36.0–46.0)
Hemoglobin: 11.8 g/dL — ABNORMAL LOW (ref 12.0–15.0)
Immature Granulocytes: 0 %
Lymphocytes Relative: 19 %
Lymphs Abs: 1.2 10*3/uL (ref 0.7–4.0)
MCH: 30.2 pg (ref 26.0–34.0)
MCHC: 32.2 g/dL (ref 30.0–36.0)
MCV: 93.9 fL (ref 80.0–100.0)
Monocytes Absolute: 0.5 10*3/uL (ref 0.1–1.0)
Monocytes Relative: 8 %
Neutro Abs: 4.3 10*3/uL (ref 1.7–7.7)
Neutrophils Relative %: 67 %
Platelet Count: 199 10*3/uL (ref 150–400)
RBC: 3.91 MIL/uL (ref 3.87–5.11)
RDW: 13.5 % (ref 11.5–15.5)
WBC Count: 6.5 10*3/uL (ref 4.0–10.5)
nRBC: 0 % (ref 0.0–0.2)

## 2018-11-20 LAB — FERRITIN: Ferritin: 76 ng/mL (ref 11–307)

## 2018-11-20 LAB — IRON AND TIBC
Iron: 55 ug/dL (ref 41–142)
Saturation Ratios: 18 % — ABNORMAL LOW (ref 21–57)
TIBC: 301 ug/dL (ref 236–444)
UIBC: 246 ug/dL (ref 120–384)

## 2018-11-20 NOTE — Progress Notes (Signed)
[facility] CSW Progress Notes  Message from [employer], patient reports bedbugs in the home.  Does not have funds to pay for exterminator.  CSW is not aware of any community resources for this problem.  Called [employer] and [employer] - neither have any resources to suggest.  Per both resources, treatment is costly and no community agencies provide funding for this need.  If patient rents, she will need to contact landlord and see if they can provide any relief.  If she owns her own home, the patient is responsible for costs of extermination.  Called patient, left VM w request to call back for further information.  Angela Shell, LCSW Clinical Social Worker Phone:  [phone]

## 2018-11-21 ENCOUNTER — Telehealth: Payer: Self-pay

## 2018-11-21 NOTE — Progress Notes (Signed)
Patient Care Team: Gaynelle Arabian, MD as PCP - General (Family Medicine) Jerline Pain, MD as PCP - Cardiology (Cardiology) Jerline Pain, MD as Consulting Physician (Cardiology) Wonda Horner, MD as Consulting Physician (Gastroenterology) Ralene Ok, MD as Consulting Physician (Surgery) Nicholas Lose, MD as Consulting Physician (Hematology and Oncology) Sylvan Cheese, NP as Nurse Practitioner (Hematology and Oncology) Jovita Kussmaul, MD as Consulting Physician (General Surgery) Thea Silversmith, MD as Consulting Physician (Radiation Oncology)  DIAGNOSIS:    ICD-10-CM   1. Malignant neoplasm of upper-outer quadrant of right breast in female, estrogen receptor negative ([place])  C50.411    Z17.1   2. Iron deficiency anemia, unspecified iron deficiency anemia type  D50.9     SUMMARY OF ONCOLOGIC HISTORY: Oncology History  Breast cancer of upper-outer quadrant of right female breast ([place])  07/30/2014 Mammogram   Right breast: Mass at 10 o'clock location 4 cm from the nipple measuring 1.7 x 1.3 x 1.3 cm. This corresponds to the palpable and mammographic finding. In the right axilla, there are several lymph nodes which are normal in size measuring up to 10m.   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%), PR- (0%), HER2/neu negative, Ki67 30%   08/27/2014 Clinical Stage   Stage IIA: T2 N0   09/22/2014 Definitive Surgery   Right lumpectomy/SLNB (Marlou Starks: Invasive ductal carcinoma, grade 3, 2.6 cm, high grade DCIS, LVI present, ER-, PR-, 2 LN removed and 1 positive for metastatic carcinoma (1/2)   09/22/2014 Pathologic Stage   Stage IIB: T2 N1 M0   10/15/2014 - 12/16/2014 Chemotherapy   Adjuvant chemotherapy with Taxotere Cytoxan 4 q3 weeks   10/21/2014 - 10/24/2014 Hospital Admission   Admission for neutropenic fever UTI Klebsiella   01/26/2015 - 03/11/2015 Radiation Therapy   Adjuvant RT (Pablo Ledger: Right  breast / 45 Gray @ 1.8 GPearline Cablesper fraction x 25 fractions Right supraclavicular fossa/PAB 45 Gy '@1' .8 Gy per fraction x 25 fractions Right breast boost / 16 Gray at 2[employer] Corporationper fraction x 8 fractions   05/19/2015 Survivorship   Survivorship care plan visit completed.   03/28/2018 Surgery   Laparoscopic cholecystectomy     CHIEF COMPLIANT: Follow-up of breast cancer and iron deficiency anemia  INTERVAL HISTORY: Angela RUFFNERis a 73y.o. with above-mentioned history of right breast cancer treated with lumpectomy, adjuvant chemotherapy, and radiation who is currently in surveillance. She was also diagnosed with iron deficiency anemia previously treated with IV iron. She was admitted from 08/15/18-08/18/18 for URI symptoms and put on cefpodoxime for a few days. She was COVID negative. Mammogram on 11/06/18 showed no evidence of malignancy bilaterally. She presents to the clinic today for follow-up.   REVIEW OF SYSTEMS:   Constitutional: Denies fevers, chills or abnormal weight loss Eyes: Denies blurriness of vision Ears, nose, mouth, throat, and face: Denies mucositis or sore throat Respiratory: Denies cough, dyspnea or wheezes Cardiovascular: Denies palpitation, chest discomfort Gastrointestinal: Denies nausea, heartburn or change in bowel habits Skin: Denies abnormal skin rashes Lymphatics: Denies new lymphadenopathy or easy bruising Neurological: Denies numbness, tingling or new weaknesses Behavioral/Psych: Mood is stable, no new changes  Extremities: No lower extremity edema Breast: denies any pain or lumps or nodules in either breasts All other systems were reviewed with the patient and are negative.  I have reviewed the past medical history, past surgical history, social history and family history with the patient and they are  unchanged from previous note.  ALLERGIES:  is allergic to shellfish allergy; aspirin; penicillins; aleve [naproxen]; brilinta [ticagrelor]; ciprofloxacin;  doxycycline; evista [raloxifene]; fosamax [alendronate sodium]; lactose intolerance (gi); latex; naproxen sodium; nitrofurantoin; pravachol [pravastatin]; sulfa antibiotics; tape; welchol [colesevelam hcl]; and zocor [simvastatin].  MEDICATIONS:  Current Outpatient Medications  Medication Sig Dispense Refill  . acetaminophen (TYLENOL) 500 MG tablet Take 1,000 mg by mouth every 6 (six) hours as needed for moderate pain or headache.    Marland Kitchen aspirin EC 81 MG EC tablet Take 1 tablet (81 mg total) by mouth daily.    . carboxymethylcellulose (REFRESH TEARS) 0.5 % SOLN Place 1 drop into both eyes 4 (four) times daily.    . Cholecalciferol (VITAMIN D) 2000 UNITS tablet Take 2,000 Units by mouth 2 (two) times daily.     . clopidogrel (PLAVIX) 75 MG tablet Take 1 tablet (75 mg total) by mouth daily. 90 tablet 3  . dexlansoprazole (DEXILANT) 60 MG capsule Take 60 mg by mouth daily.    . diphenhydrAMINE-zinc acetate (BENADRYL) cream Apply 1 application topically at bedtime as needed for itching.    . escitalopram (LEXAPRO) 20 MG tablet Take 20 mg by mouth daily.    Marland Kitchen ezetimibe (ZETIA) 10 MG tablet Take 1 tablet (10 mg total) by mouth at bedtime. 90 tablet 3  . famotidine (PEPCID) 20 MG tablet Take 20 mg by mouth daily.    . fexofenadine (ALLEGRA) 180 MG tablet Take 180 mg by mouth daily as needed for allergies.     . furosemide (LASIX) 20 MG tablet Take one tablet daily as needed for 3 lbs over night weight gain 30 tablet 3  . gabapentin (NEURONTIN) 600 MG tablet Take 1 tablet (600 mg total) by mouth 3 (three) times daily. 90 tablet 0  . KLOR-CON M10 10 MEQ tablet Take 10 mEq by mouth as needed. Take 1 tablet daily with Furosemide     . LORazepam (ATIVAN) 1 MG tablet Take 1 mg by mouth daily.    Marland Kitchen lovastatin (MEVACOR) 40 MG tablet TAKE 1 TABLET BY MOUTH AT  BEDTIME 90 tablet 2  . Magnesium 500 MG TABS Take 500 mg by mouth at bedtime.    . meclizine (ANTIVERT) 25 MG tablet Take 12.5-25 mg by mouth 3 (three)  times daily as needed for dizziness.    . montelukast (SINGULAIR) 10 MG tablet Take 10 mg by mouth at bedtime.    . nitroGLYCERIN (NITROSTAT) 0.4 MG SL tablet Place 1 tablet (0.4 mg total) under the tongue every 5 (five) minutes as needed for chest pain (CP or SOB). 25 tablet 6  . nystatin cream (MYCOSTATIN) Apply 1 application topically 2 (two) times daily. 30 g 4  . potassium chloride (K-DUR) 10 MEQ tablet Take 1 tablet (10 mEq total) by mouth every other day. Take with Lasix only 30 tablet 3  . sodium chloride (OCEAN) 0.65 % SOLN nasal spray Place 2 sprays into both nostrils as needed for congestion.    Marland Kitchen tiZANidine (ZANAFLEX) 2 MG tablet Take 2-4 mg by mouth every 8 (eight) hours as needed for muscle spasms.    . vitamin C (ASCORBIC ACID) 500 MG tablet Take 500 mg by mouth daily.     No current facility-administered medications for this visit.     PHYSICAL EXAMINATION: ECOG PERFORMANCE STATUS: 1 - Symptomatic but completely ambulatory  Vitals:   11/22/18 1024  BP: 111/69  Pulse: 65  Resp: 18  Temp: 98.5 F (36.9 C)  SpO2: 97%  Filed Weights   11/22/18 1024  Weight: 161 lb 4 oz (73.1 kg)    GENERAL: alert, no distress and comfortable SKIN: skin color, texture, turgor are normal, no rashes or significant lesions EYES: normal, Conjunctiva are pink and non-injected, sclera clear OROPHARYNX: no exudate, no erythema and lips, buccal mucosa, and tongue normal  NECK: supple, thyroid normal size, non-tender, without nodularity LYMPH: no palpable lymphadenopathy in the cervical, axillary or inguinal LUNGS: clear to auscultation and percussion with normal breathing effort HEART: regular rate & rhythm and no murmurs and no lower extremity edema ABDOMEN: abdomen soft, non-tender and normal bowel sounds MUSCULOSKELETAL: no cyanosis of digits and no clubbing  NEURO: alert & oriented x 3 with fluent speech, no focal motor/sensory deficits EXTREMITIES: No lower extremity edema   LABORATORY DATA:  I have reviewed the data as listed CMP Latest Ref Rng & Units 08/18/2018 08/17/2018 08/16/2018  Glucose 70 - 99 mg/dL 110(H) 118(H) 113(H)  BUN 8 - 23 mg/dL '13 12 8  ' Creatinine 0.44 - 1.00 mg/dL 0.87 0.99 0.85  Sodium 135 - 145 mmol/L 138 137 137  Potassium 3.5 - 5.1 mmol/L 4.0 4.9 4.0  Chloride 98 - 111 mmol/L 98 104 104  CO2 22 - 32 mmol/L '30 25 26  ' Calcium 8.9 - 10.3 mg/dL 8.8(L) 8.7(L) 8.2(L)  Total Protein 6.5 - 8.1 g/dL 6.9 6.7 -  Total Bilirubin 0.3 - 1.2 mg/dL 0.5 0.5 -  Alkaline Phos 38 - 126 U/L 84 87 -  AST 15 - 41 U/L 18 19 -  ALT 0 - 44 U/L 13 12 -    Lab Results  Component Value Date   WBC 6.5 11/20/2018   HGB 11.8 (L) 11/20/2018   HCT 36.7 11/20/2018   MCV 93.9 11/20/2018   PLT 199 11/20/2018   NEUTROABS 4.3 11/20/2018    ASSESSMENT & PLAN:  Breast cancer of upper-outer quadrant of right female breast ([place]) Diagnosed 2016 treated with lumpectomy, Taxotere/Cytoxan as well as radiation therapy, triple negative  Breast cancer surveillance: 1.  Breast exam 05/18/2018: Benign 2.  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 Patient received blood transfusion at [place] given on 02/17/2017, 11/22/2017, January 2020  Lab review: 11/20/2018: Iron saturation 18%, ferritin 76, hemoglobin 11.8, MCV 93.9 She does not need IV iron at this time. Continues to have severe fatigue unrelated to her iron deficiency.  She is helping take care of her husband who has some dementia and health issues.  Her husband also has prostate cancer and is losing weight.  Hospitalization in April for hypokalemia due to diuretic usage: Currently taking potassium replacement therapy.  Return to clinic in 6 months with labs and  follow-up    No orders of the defined types were placed in this encounter.  The patient has a good understanding of the overall plan. she agrees with it. she will call with any problems that may develop before the next visit here.  Nicholas Lose, MD 11/22/2018  Julious Oka Dorshimer am acting as scribe for Dr. Nicholas Lose.  I have reviewed the above documentation for accuracy and completeness, and I agree with the above.

## 2018-11-21 NOTE — Telephone Encounter (Signed)
RN left voicemail for return call.   

## 2018-11-22 ENCOUNTER — Inpatient Hospital Stay (HOSPITAL_BASED_OUTPATIENT_CLINIC_OR_DEPARTMENT_OTHER): Payer: Medicare Other | Admitting: Hematology and Oncology

## 2018-11-22 ENCOUNTER — Other Ambulatory Visit: Payer: Self-pay

## 2018-11-22 DIAGNOSIS — E876 Hypokalemia: Secondary | ICD-10-CM | POA: Diagnosis not present

## 2018-11-22 DIAGNOSIS — Z171 Estrogen receptor negative status [ER-]: Secondary | ICD-10-CM

## 2018-11-22 DIAGNOSIS — C50411 Malignant neoplasm of upper-outer quadrant of right female breast: Secondary | ICD-10-CM

## 2018-11-22 DIAGNOSIS — D509 Iron deficiency anemia, unspecified: Secondary | ICD-10-CM

## 2018-11-22 DIAGNOSIS — Z853 Personal history of malignant neoplasm of breast: Secondary | ICD-10-CM

## 2018-11-23 ENCOUNTER — Other Ambulatory Visit: Payer: Self-pay

## 2018-11-23 ENCOUNTER — Emergency Department (HOSPITAL_COMMUNITY): Payer: Medicare Other

## 2018-11-23 ENCOUNTER — Emergency Department (HOSPITAL_COMMUNITY)
Admission: EM | Admit: 2018-11-23 | Discharge: 2018-11-24 | Disposition: A | Discharge status: Home / Self Care | Payer: Medicare Other | Attending: Emergency Medicine | Admitting: Emergency Medicine

## 2018-11-23 DIAGNOSIS — I252 Old myocardial infarction: Secondary | ICD-10-CM | POA: Diagnosis not present

## 2018-11-23 DIAGNOSIS — S42491A Other displaced fracture of lower end of right humerus, initial encounter for closed fracture: Secondary | ICD-10-CM | POA: Diagnosis not present

## 2018-11-23 DIAGNOSIS — N390 Urinary tract infection, site not specified: Secondary | ICD-10-CM | POA: Insufficient documentation

## 2018-11-23 DIAGNOSIS — Y9301 Activity, walking, marching and hiking: Secondary | ICD-10-CM | POA: Insufficient documentation

## 2018-11-23 DIAGNOSIS — I2510 Atherosclerotic heart disease of native coronary artery without angina pectoris: Secondary | ICD-10-CM | POA: Diagnosis not present

## 2018-11-23 DIAGNOSIS — R51 Headache: Secondary | ICD-10-CM | POA: Diagnosis not present

## 2018-11-23 DIAGNOSIS — W010XXA Fall on same level from slipping, tripping and stumbling without subsequent striking against object, initial encounter: Secondary | ICD-10-CM | POA: Insufficient documentation

## 2018-11-23 DIAGNOSIS — Y999 Unspecified external cause status: Secondary | ICD-10-CM | POA: Insufficient documentation

## 2018-11-23 DIAGNOSIS — Z7982 Long term (current) use of aspirin: Secondary | ICD-10-CM | POA: Diagnosis not present

## 2018-11-23 DIAGNOSIS — I10 Essential (primary) hypertension: Secondary | ICD-10-CM | POA: Insufficient documentation

## 2018-11-23 DIAGNOSIS — Z79899 Other long term (current) drug therapy: Secondary | ICD-10-CM | POA: Insufficient documentation

## 2018-11-23 DIAGNOSIS — Z7902 Long term (current) use of antithrombotics/antiplatelets: Secondary | ICD-10-CM | POA: Insufficient documentation

## 2018-11-23 DIAGNOSIS — Y92410 Unspecified street and highway as the place of occurrence of the external cause: Secondary | ICD-10-CM | POA: Diagnosis not present

## 2018-11-23 DIAGNOSIS — Z91040 Latex allergy status: Secondary | ICD-10-CM | POA: Diagnosis not present

## 2018-11-23 DIAGNOSIS — S4991XA Unspecified injury of right shoulder and upper arm, initial encounter: Secondary | ICD-10-CM | POA: Diagnosis present

## 2018-11-23 LAB — BASIC METABOLIC PANEL
Anion gap: 11 (ref 5–15)
BUN: 9 mg/dL (ref 8–23)
CO2: 26 mmol/L (ref 22–32)
Calcium: 8.7 mg/dL — ABNORMAL LOW (ref 8.9–10.3)
Chloride: 101 mmol/L (ref 98–111)
Creatinine, Ser: 0.97 mg/dL (ref 0.44–1.00)
GFR calc Af Amer: >60 mL/min (ref >60)
GFR calc non Af Amer: 58 mL/min — ABNORMAL LOW (ref >60)
Glucose, Bld: 130 mg/dL — ABNORMAL HIGH (ref 70–99)
Potassium: 3.5 mmol/L (ref 3.5–5.1)
Sodium: 138 mmol/L (ref 135–145)

## 2018-11-23 LAB — CBC
HCT: 37.4 % (ref 36.0–46.0)
Hemoglobin: 11.7 g/dL — ABNORMAL LOW (ref 12.0–15.0)
MCH: 30.3 pg (ref 26.0–34.0)
MCHC: 31.3 g/dL (ref 30.0–36.0)
MCV: 96.9 fL (ref 80.0–100.0)
Platelets: 199 10*3/uL (ref 150–400)
RBC: 3.86 MIL/uL — ABNORMAL LOW (ref 3.87–5.11)
RDW: 13.3 % (ref 11.5–15.5)
WBC: 7.9 10*3/uL (ref 4.0–10.5)
nRBC: 0 % (ref 0.0–0.2)

## 2018-11-23 LAB — URINALYSIS, ROUTINE W REFLEX MICROSCOPIC
Bilirubin Urine: NEGATIVE
Glucose, UA: NEGATIVE mg/dL
Hgb urine dipstick: NEGATIVE
Ketones, ur: NEGATIVE mg/dL
Nitrite: POSITIVE — AB
Protein, ur: NEGATIVE mg/dL
Specific Gravity, Urine: 1.016 (ref 1.005–1.030)
WBC, UA: >50 WBC/hpf — ABNORMAL HIGH (ref 0–5)
pH: 6 (ref 5.0–8.0)

## 2018-11-23 MED ORDER — SODIUM CHLORIDE 0.9% FLUSH
3.0000 mL | Freq: Once | INTRAVENOUS | Status: AC
  Administered 2018-11-24: 3 mL via INTRAVENOUS

## 2018-11-23 NOTE — ED Provider Notes (Signed)
[facility] EMERGENCY DEPARTMENT Provider Note   CSN: [id number] Arrival date & time: 11/23/18  1846    History   Chief Complaint Chief Complaint  Patient presents with   Fall    HPI NIYATI HEINKE is a 73 y.o. female.     Patient presents to the emergency department for evaluation after a fall.  She reports that she was walking on the road when the fall occurred.  She is not sure how she fell.  She does not think she passed out.  She is complaining primarily of right elbow pain, but does have some head and neck pain as well.  Elbow pain is moderate and worsens with any movement.     Past Medical History:  Diagnosis Date   Anemia    hx   Anxiety and depression    Aortic atherosclerosis ([place]) 07/31/2017   Arthritis    "~ all over my body; in the joints"   Breast cancer, right ([place]) 3/16   Coronary artery disease    a.  s/p NSTEMI in June 2017 with DES to mid LAD.   Depression    Gallstones    GERD (gastroesophageal reflux disease)    H/O hiatal hernia    Headache    Heart murmur    History of blood transfusion    "when taking chemo & w/one of my knee replacements" (11/05/2015)   Hypercholesteremia    Hypertension    Iron deficiency anemia 02/16/2017   Multiple bruises    Myocardial infarction (HCC)    Orthostatic hypotension    Personal history of chemotherapy    Personal history of radiation therapy    Pneumonia X 1   Shingles 5/15   hx   Sliding hiatal hernia s/p lap PEH repair 07/31/2012   Unstable angina (HCC) 01/23/2012   Unsteady gait    " I fall easily"   Wears dentures    partial   Wears glasses     Patient Active Problem List   Diagnosis Date Noted   Suspected Covid-19 Virus Infection 08/17/2018   Asymptomatic bacteriuria 08/17/2018   Hypomagnesemia 08/15/2018   Fever 08/15/2018   Gallstones 03/28/2018   Obesity (BMI 30-39.9) 07/31/2017   Aortic atherosclerosis ([place]) 07/31/2017   Iron  deficiency anemia 02/16/2017   CAD (coronary artery disease), native coronary artery 01/16/2016   Orthostatic hypotension 11/19/2015   Hypokalemia 10/21/2014   Breast cancer of upper-outer quadrant of right female breast ([place]) 09/09/2014   Lumbar spinal stenosis 07/31/2014   GERD (gastroesophageal reflux disease)    Depression    Hypercholesteremia    Arthritis    Paroxysmal atrial fibrillation ([place]) 08/12/2012   Hypertension    Sliding hiatal hernia s/p lap PEH repair 07/31/2012   Unstable angina ([place]) 01/23/2012    Past Surgical History:  Procedure Laterality Date   BREAST BIOPSY Right    BREAST LUMPECTOMY Right 08/2014   BREAST LUMPECTOMY WITH SENTINEL LYMPH NODE BIOPSY Right 09/22/2014   Procedure: RIGHT BREAST LUMPECTOMY WITH NEEDLE LOCALIZATION AND RIGHT AXILLARY SENTINEL LYMPH NODE BIOPSY;  Surgeon: [name], MD;  Location: [place];  Service: General;  Laterality: Right;   CARDIAC CATHETERIZATION     CARDIAC CATHETERIZATION N/A 11/05/2015   Procedure: Left Heart Cath and Coronary Angiography;  Surgeon: Belva Crome, MD;  Location: [place] [facility];  Service: Cardiovascular;  Laterality: N/A;   CARDIAC CATHETERIZATION N/A 11/05/2015   Procedure: Coronary Stent Intervention;  Surgeon: Belva Crome, MD;  Location: [place] [facility];  Service: Cardiovascular;  Laterality: N/A;   CARPAL TUNNEL RELEASE Right 09/2015   CHOLECYSTECTOMY N/A 03/28/2018   Procedure: LAPAROSCOPIC CHOLECYSTECTOMY WITH INTRAOPERATIVE CHOLANGIOGRAM ERAS PATHWAY;  Surgeon: Jovita Kussmaul, MD;  Location: [place];  Service: General;  Laterality: N/A;   CORONARY ANGIOPLASTY WITH STENT PLACEMENT  11/05/2015   "1 stent"   ESOPHAGEAL MANOMETRY N/A 07/02/2012   Procedure: ESOPHAGEAL MANOMETRY (EM);  Surgeon: Lear Ng, MD;  Location: [facility];  Service: Endoscopy;  Laterality: N/A;  schooler to read   EYE SURGERY Left 2013   tube placed tear duct   HIATAL HERNIA REPAIR N/A  08/10/2012   Procedure: LAPAROSCOPIC REPAIR OF HIATAL HERNIA WITH MESH AND EGD WITH PEG TUBE PLACEMENT;  Surgeon: Ralene Ok, MD;  Location: [facility];  Service: General;  Laterality: N/A;   LEFT HEART CATH AND CORONARY ANGIOGRAPHY N/A 08/01/2017   Procedure: LEFT HEART CATH AND CORONARY ANGIOGRAPHY;  Surgeon: Jettie Booze, MD;  Location: [place] [facility];  Service: Cardiovascular;  Laterality: N/A;   PORT-A-CATH REMOVAL  06/2015   PORTACATH PLACEMENT Left 09/22/2014   Procedure: INSERTION PORT-A-CATH;  Surgeon: [name], MD;  Location: [place];  Service: General;  Laterality: Left;   POSTERIOR LUMBAR FUSION  2016   "had 2 vertebrae fused"   TONSILLECTOMY  as child   TOTAL KNEE ARTHROPLASTY Bilateral 2011,2012     OB History    Gravida  1   Para  1   Term      Preterm      AB      Living  1     SAB      TAB      Ectopic      Multiple      Live Births               Home Medications    Prior to Admission medications   Medication Sig Start Date End Date Taking? Authorizing Provider  acetaminophen (TYLENOL) 500 MG tablet Take 1,000 mg by mouth every 6 (six) hours as needed for moderate pain or headache.   Yes [provider]  aspirin EC 81 MG EC tablet Take 1 tablet (81 mg total) by mouth daily. 11/06/15  Yes Lyda Jester M, PA-C  Cholecalciferol (VITAMIN D) 2000 UNITS tablet Take 2,000 Units by mouth 2 (two) times daily.    Yes [provider]  clopidogrel (PLAVIX) 75 MG tablet Take 1 tablet (75 mg total) by mouth daily. 09/07/18  Yes Jerline Pain, MD  dexlansoprazole (DEXILANT) 60 MG capsule Take 60 mg by mouth daily.   Yes [provider]  escitalopram (LEXAPRO) 20 MG tablet Take 20 mg by mouth daily. 01/22/16  Yes [provider]  ezetimibe (ZETIA) 10 MG tablet Take 1 tablet (10 mg total) by mouth at bedtime. 09/07/18  Yes Jerline Pain, MD  famotidine (PEPCID) 20 MG tablet Take 20 mg by mouth daily. 08/31/18   Yes [provider]  fexofenadine (ALLEGRA) 180 MG tablet Take 180 mg by mouth daily as needed for allergies.    Yes [provider]  furosemide (LASIX) 20 MG tablet Take one tablet daily as needed for 3 lbs over night weight gain Patient taking differently: Take 20 mg by mouth every other day.  09/07/18  Yes Jerline Pain, MD  gabapentin (NEURONTIN) 600 MG tablet Take 1 tablet (600 mg total) by mouth 3 (three) times daily. 10/18/18  Yes Hyatt, Max T, DPM  LORazepam (ATIVAN) 1 MG tablet Take 1 mg by mouth daily. 09/04/18  Yes [provider]  lovastatin (MEVACOR) 40 MG tablet TAKE 1 TABLET BY MOUTH AT  BEDTIME Patient taking differently: Take 40 mg by mouth at bedtime.  06/29/18  Yes Jerline Pain, MD  Magnesium 500 MG TABS Take 500 mg by mouth at bedtime.   Yes [provider]  meclizine (ANTIVERT) 25 MG tablet Take 12.5-25 mg by mouth 3 (three) times daily as needed for dizziness.   Yes [provider]  montelukast (SINGULAIR) 10 MG tablet Take 10 mg by mouth at bedtime. 08/23/18  Yes [provider]  nitroGLYCERIN (NITROSTAT) 0.4 MG SL tablet Place 1 tablet (0.4 mg total) under the tongue every 5 (five) minutes as needed for chest pain (CP or SOB). 09/07/18  Yes Jerline Pain, MD  potassium chloride (K-DUR) 10 MEQ tablet Take 1 tablet (10 mEq total) by mouth every other day. Take with Lasix only 09/07/18  Yes Skains, Thana Farr, MD  sodium chloride (OCEAN) 0.65 % SOLN nasal spray Place 2 sprays into both nostrils as needed for congestion.   Yes [provider]  tiZANidine (ZANAFLEX) 2 MG tablet Take 2-4 mg by mouth every 8 (eight) hours as needed for muscle spasms. 05/01/18  Yes [provider]  vitamin C (ASCORBIC ACID) 500 MG tablet Take 500 mg by mouth daily.   Yes [provider]  cephALEXin (KEFLEX) 500 MG capsule Take 1 capsule (500 mg total) by mouth 2 (two) times daily. 11/24/18   Orpah Greek, MD    HYDROcodone-acetaminophen (NORCO/VICODIN) 5-325 MG tablet Take 1 tablet by mouth every 4 (four) hours as needed for moderate pain. 11/24/18   Orpah Greek, MD  nystatin cream (MYCOSTATIN) Apply 1 application topically 2 (two) times daily. Patient not taking: Reported on 11/24/2018 05/17/18   Fontaine, Belinda Block, MD  ondansetron (ZOFRAN) 4 MG tablet Take 1 tablet (4 mg total) by mouth every 6 (six) hours as needed for nausea or vomiting. 11/24/18   Leam Madero, Gwenyth Allegra, MD    Family History Family History  Problem Relation Age of Onset   Heart disease Mother        30s, CHF   Heart disease Father        d/o MI at 74   Cancer Father        skin   Diabetes Father    Cancer Sister 82       breast   Diabetes Sister    Heart disease Sister        CABG in mid-60s    Social History Social History   Tobacco Use   Smoking status: Never Smoker   Smokeless tobacco: Never Used  Substance Use Topics   Alcohol use: No   Drug use: No     Allergies   Shellfish allergy, Aspirin, Penicillins, Aleve [naproxen], Brilinta [ticagrelor], Ciprofloxacin, Doxycycline, Evista [raloxifene], Fosamax [alendronate sodium], Lactose intolerance (gi), Latex, Naproxen sodium, Nitrofurantoin, Pravachol [pravastatin], Sulfa antibiotics, Tape, Welchol [colesevelam hcl], and Zocor [simvastatin]   Review of Systems Review of Systems  Musculoskeletal: Positive for arthralgias and neck pain.  Neurological: Positive for headaches.  All other systems reviewed and are negative.    Physical Exam Updated Vital Signs BP 133/79    Pulse 72    Temp 99.2 F (37.3 C) (Oral)    Resp (!) 21    SpO2 98%   Physical Exam Vitals  signs and nursing note reviewed.  Constitutional:      General: She is not in acute distress.    Appearance: Normal appearance. She is well-developed.  HENT:     Head: Normocephalic and atraumatic.     Right Ear: Hearing normal.     Left Ear: Hearing normal.     Nose:  Nose normal.  Eyes:     Conjunctiva/sclera: Conjunctivae normal.     Pupils: Pupils are equal, round, and reactive to light.  Neck:     Musculoskeletal: Normal range of motion and neck supple.  Cardiovascular:     Rate and Rhythm: Regular rhythm.     Heart sounds: S1 normal and S2 normal. No murmur. No friction rub. No gallop.   Pulmonary:     Effort: Pulmonary effort is normal. No respiratory distress.     Breath sounds: Normal breath sounds.  Chest:     Chest wall: No tenderness.  Abdominal:     General: Bowel sounds are normal.     Palpations: Abdomen is soft.     Tenderness: There is no abdominal tenderness. There is no guarding or rebound. Negative signs include Murphy's sign and McBurney's sign.     Hernia: No hernia is present.  Musculoskeletal:     Right elbow: She exhibits decreased range of motion. She exhibits no deformity. Tenderness found.     Comments: Cracked distal tip of thumbnail with abrasion of tip of thumb  Skin:    General: Skin is warm and dry.     Findings: No rash.  Neurological:     Mental Status: She is alert and oriented to person, place, and time.     GCS: GCS eye subscore is 4. GCS verbal subscore is 5. GCS motor subscore is 6.     Cranial Nerves: No cranial nerve deficit.     Sensory: No sensory deficit.     Coordination: Coordination normal.  Psychiatric:        Speech: Speech normal.        Behavior: Behavior normal.        Thought Content: Thought content normal.      ED Treatments / Results  Labs (all labs ordered are listed, but only abnormal results are displayed) Labs Reviewed  BASIC METABOLIC PANEL - Abnormal; Notable for the following components:      Result Value   Glucose, Bld 130 (*)    Calcium 8.7 (*)    GFR calc non Af Amer 58 (*)    All other components within normal limits  CBC - Abnormal; Notable for the following components:   RBC 3.86 (*)    Hemoglobin 11.7 (*)    All other components within normal limits    URINALYSIS, ROUTINE W REFLEX MICROSCOPIC - Abnormal; Notable for the following components:   APPearance HAZY (*)    Nitrite POSITIVE (*)    Leukocytes,Ua LARGE (*)    WBC, UA >50 (*)    Bacteria, UA RARE (*)    All other components within normal limits  CBG MONITORING, ED - Abnormal; Notable for the following components:   Glucose-Capillary 115 (*)    All other components within normal limits  URINE CULTURE    EKG EKG Interpretation  Date/Time:  Friday November 23 2018 18:59:02 EDT Ventricular Rate:  69 PR Interval:  158 QRS Duration: 76 QT Interval:  426 QTC Calculation: 456 R Axis:   66 Text Interpretation:  Normal sinus rhythm Normal ECG Confirmed by Orpah Greek (  [id number]) on 11/23/2018 11:48:56 PM   Radiology Dg Elbow Complete Right  Result Date: 11/24/2018 CLINICAL DATA:  Fall with elbow pain EXAM: RIGHT ELBOW - COMPLETE 3+ VIEW COMPARISON:  11/23/2018 FINDINGS: Limited by positioning. Curvilinear osseous fragment projecting over the antecubital fossa, presumably humeral condyle fracture fragment. On 1 of the 2 lateral views, the radial head appears slightly subluxed anteriorly. IMPRESSION: Abnormal elbow radiographs. Suspect that there may be a displaced humeral condyle fracture fragment. Possible anterior subluxation of radial head on 1 of the two views. CT is recommended for further evaluation. Electronically Signed   By: Donavan Foil M.D.   On: 11/24/2018 00:34   Dg Forearm Right  Result Date: 11/23/2018 CLINICAL DATA:  Arm pain status post fall. EXAM: RIGHT FOREARM - 2 VIEW COMPARISON:  None. FINDINGS: There is soft tissue swelling about the forearm. There is no definite acute displaced fracture or dislocation. There is a somewhat abnormal appearance of the right elbow which may be projectional. There are degenerative changes of the wrist and first digit. IMPRESSION: 1. Soft tissue swelling about the forearm. 2. No acute displaced fracture or dislocation. 3. Abnormal  appearance of the elbow which may be projectional. Follow-up with dedicated elbow radiographs is recommended. Electronically Signed   By: Constance Holster M.D.   On: 11/23/2018 20:34   Ct Head Wo Contrast  Result Date: 11/24/2018 CLINICAL DATA:  Fall takes Plavix EXAM: CT HEAD WITHOUT CONTRAST CT CERVICAL SPINE WITHOUT CONTRAST TECHNIQUE: Multidetector CT imaging of the head and cervical spine was performed following the standard protocol without intravenous contrast. Multiplanar CT image reconstructions of the cervical spine were also generated. COMPARISON:  CT 08/03/2017 FINDINGS: CT HEAD FINDINGS Brain: No acute territorial infarction, hemorrhage, or intracranial mass. Mild atrophy. Chronic lacunar infarct in the right basal ganglia. Stable ventricle size Vascular: No hyperdense vessels.  Carotid vascular calcification Skull: Normal. Negative for fracture or focal lesion. Sinuses/Orbits: No acute finding. Mucous retention cyst right maxillary sinus Other: None CT CERVICAL SPINE FINDINGS Alignment: Trace anterolisthesis C4 on C5. Facet alignment within normal limits Skull base and vertebrae: No acute fracture. No primary bone lesion or focal pathologic process. Small focal sclerosis right lateral mass of C1, possible bone island Soft tissues and spinal canal: No prevertebral fluid or swelling. No visible canal hematoma. Disc levels: Mild degenerative changes at C4-C5, C5-C6 and C6-C7. Multiple level left greater than right hypertrophic facet degenerative change. Upper chest: Pleural and parenchymal scarring at the right apex. Other: None IMPRESSION: 1. No CT evidence for acute intracranial abnormality.  Mild atrophy 2. Trace anterolisthesis C4 on C5, likely degenerative. No acute osseous abnormality of the cervical spine. Electronically Signed   By: Donavan Foil M.D.   On: 11/24/2018 00:43   Ct Cervical Spine Wo Contrast  Result Date: 11/24/2018 CLINICAL DATA:  Fall takes Plavix EXAM: CT HEAD WITHOUT  CONTRAST CT CERVICAL SPINE WITHOUT CONTRAST TECHNIQUE: Multidetector CT imaging of the head and cervical spine was performed following the standard protocol without intravenous contrast. Multiplanar CT image reconstructions of the cervical spine were also generated. COMPARISON:  CT 08/03/2017 FINDINGS: CT HEAD FINDINGS Brain: No acute territorial infarction, hemorrhage, or intracranial mass. Mild atrophy. Chronic lacunar infarct in the right basal ganglia. Stable ventricle size Vascular: No hyperdense vessels.  Carotid vascular calcification Skull: Normal. Negative for fracture or focal lesion. Sinuses/Orbits: No acute finding. Mucous retention cyst right maxillary sinus Other: None CT CERVICAL SPINE FINDINGS Alignment: Trace anterolisthesis C4 on C5. Facet alignment within normal limits  Skull base and vertebrae: No acute fracture. No primary bone lesion or focal pathologic process. Small focal sclerosis right lateral mass of C1, possible bone island Soft tissues and spinal canal: No prevertebral fluid or swelling. No visible canal hematoma. Disc levels: Mild degenerative changes at C4-C5, C5-C6 and C6-C7. Multiple level left greater than right hypertrophic facet degenerative change. Upper chest: Pleural and parenchymal scarring at the right apex. Other: None IMPRESSION: 1. No CT evidence for acute intracranial abnormality.  Mild atrophy 2. Trace anterolisthesis C4 on C5, likely degenerative. No acute osseous abnormality of the cervical spine. Electronically Signed   By: Donavan Foil M.D.   On: 11/24/2018 00:43   Dg Humerus Right  Result Date: 11/23/2018 CLINICAL DATA:  Pain status post fall EXAM: RIGHT HUMERUS - 2+ VIEW COMPARISON:  None. FINDINGS: There is no definite acute displaced fracture or dislocation. There is an abnormal appearance of the right elbow which may be projectional. No definite evidence for a glenohumeral dislocation on the right. IMPRESSION: Abnormal appearance of the right elbow which may  be projectional. Dedicated right elbow radiographs is recommended. Electronically Signed   By: Constance Holster M.D.   On: 11/23/2018 20:35    Procedures Procedures (including critical care time)  Medications Ordered in ED Medications  sodium chloride flush (NS) 0.9 % injection 3 mL (3 mLs Intravenous Given 11/24/18 0409)  cefTRIAXone (ROCEPHIN) 1 g in sodium chloride 0.9 % 100 mL IVPB (1 g Intravenous New Bag/Given 11/24/18 0421)  morphine 2 MG/ML injection 2 mg (2 mg Intravenous Given 11/24/18 0420)  ondansetron (ZOFRAN) injection 4 mg (4 mg Intravenous Given 11/24/18 0421)     Initial Impression / Assessment and Plan / ED Course  I have reviewed the triage vital signs and the nursing notes.  Pertinent labs & imaging results that were available during my care of the patient were reviewed by me and considered in my medical decision making (see chart for details).        Patient presents to the emergency department for evaluation after a fall.  Patient had a ground-level fall earlier tonight and injured her right elbow.  She did not think that she hit her head but did have some neck pain.  CT head and cervical spine did not show any evidence of injury.  Blood work unremarkable, urinalysis did show signs of infection.  She has not noticed any urinary tract symptoms.  Patient administered Rocephin for UTI.  No evidence of sepsis.  Initial x-ray of the right arm was suspicious for injury of the elbow, dedicated elbow films were performed.  This did not conclusively show fracture but was suggestive, CT scan performed.  This shows evidence of distal humerus fracture.  Discussed with Dr. Stann Mainland, findings of CT scan were reviewed and he was going to review the images himself.  He felt the patient could be either splinted or simply placed in a sling.  Patient having a great deal of pain and therefore she was placed in a splint and sling.  She was administered morphine for her pain with improvement.   Will discharge with hydrocodone.  Follow-up with Dr. Stann Mainland in 1 week as requested.  Final Clinical Impressions(s) / ED Diagnoses   Final diagnoses:  Lower urinary tract infectious disease  Other closed displaced fracture of distal end of right humerus, initial encounter    ED Discharge Orders         Ordered    cephALEXin (KEFLEX) 500 MG capsule  2 times  daily     11/24/18 0506    ondansetron (ZOFRAN) 4 MG tablet  Every 6 hours PRN     11/24/18 0506    HYDROcodone-acetaminophen (NORCO/VICODIN) 5-325 MG tablet  Every 4 hours PRN     11/24/18 0506           Orpah Greek, MD 11/24/18 0507

## 2018-11-23 NOTE — ED Triage Notes (Signed)
Pt reports fell today and landed on R elbow on the side of road, unsure if LOC. Pt reports pain to R arm and posterior neck. Pt is currently taking Plavix. EMS placed c-collar.

## 2018-11-23 NOTE — ED Notes (Signed)
Pt placed in a gown and hooked up to the monitor with BP cuff, pulse ox and hooked up to the cardiac monitor

## 2018-11-24 ENCOUNTER — Emergency Department (HOSPITAL_COMMUNITY): Payer: Medicare Other

## 2018-11-24 LAB — CBG MONITORING, ED: Glucose-Capillary: 115 mg/dL — ABNORMAL HIGH (ref 70–99)

## 2018-11-24 MED ORDER — ONDANSETRON HCL 4 MG PO TABS: 4.0000 mg | ORAL_TABLET | Freq: Four times a day (QID) | ORAL | 0 refills | Status: DC | PRN

## 2018-11-24 MED ORDER — CEPHALEXIN 500 MG PO CAPS: 500.0000 mg | ORAL_CAPSULE | Freq: Two times a day (BID) | ORAL | 0 refills | Status: DC

## 2018-11-24 MED ORDER — ONDANSETRON HCL 4 MG/2ML IJ SOLN
4.0000 mg | Freq: Once | INTRAMUSCULAR | Status: AC
  Administered 2018-11-24: 4 mg via INTRAVENOUS
  Filled 2018-11-24: qty 2

## 2018-11-24 MED ORDER — HYDROCODONE-ACETAMINOPHEN 5-325 MG PO TABS: 1.0000 | ORAL_TABLET | ORAL | 0 refills | Status: DC | PRN

## 2018-11-24 MED ORDER — MORPHINE SULFATE (PF) 2 MG/ML IV SOLN
2.0000 mg | Freq: Once | INTRAVENOUS | Status: AC
  Administered 2018-11-24: 2 mg via INTRAVENOUS
  Filled 2018-11-24: qty 1

## 2018-11-24 MED ORDER — SODIUM CHLORIDE 0.9 % IV SOLN
1.0000 g | Freq: Once | INTRAVENOUS | Status: AC
  Administered 2018-11-24: 1 g via INTRAVENOUS
  Filled 2018-11-24: qty 10

## 2018-11-24 NOTE — ED Notes (Signed)
Checked CBG 115

## 2018-11-24 NOTE — Progress Notes (Signed)
Orthopedic Tech Progress Note Patient Details:  Angela Bond October 26, 1945 [id number]  Ortho Devices Type of Ortho Device: Arm sling       [place] Pink 11/24/2018, 7:47 AM

## 2018-11-26 LAB — URINE CULTURE: Culture: >=100000 — AB

## 2018-11-27 ENCOUNTER — Telehealth: Payer: Self-pay

## 2018-11-27 NOTE — Telephone Encounter (Signed)
Post ED Visit - Positive Culture Follow-up  Culture report reviewed by antimicrobial stewardship pharmacist: [place] Team []  Elenor Quinones, Pharm.D. []  Heide Guile, Pharm.D., BCPS AQ-ID [x]  Parks Neptune, Pharm.D., BCPS []  Alycia Rossetti, Pharm.D., BCPS []  [place], [place].D., BCPS, AAHIVP []  Legrand Como, Pharm.D., BCPS, AAHIVP []  Salome Arnt, PharmD, BCPS []  Johnnette Gourd, PharmD, BCPS []  Hughes Better, PharmD, BCPS []  Leeroy Cha, PharmD []  Laqueta Linden, PharmD, BCPS []  Albertina Parr, PharmD  [place] Team []  Leodis Sias, PharmD []  Lindell Spar, PharmD []  Royetta Asal, PharmD []  Graylin Shiver, Rph []  Rema Fendt) Glennon Mac, PharmD []  Arlyn Dunning, PharmD []  Netta Cedars, PharmD []  Dia Sitter, PharmD []  Leone Haven, PharmD []  Gretta Arab, PharmD []  Theodis Shove, PharmD []  Peggyann Juba, PharmD []  Reuel Boom, PharmD   Positive urine culture Treated with Cephalexin, organism sensitive to the same and no further patient follow-up is required at this time.  Genia Del 11/27/2018, 11:55 AM

## 2018-12-11 ENCOUNTER — Telehealth: Payer: Self-pay | Admitting: Nurse Practitioner

## 2018-12-11 NOTE — Telephone Encounter (Signed)
New Message         COVID-19 Pre-Screening Questions:   In the past 7 to 10 days have you had a cough,  shortness of breath, headache, congestion, fever (100 or greater) body aches, chills, sore throat, or sudden loss of taste or sense of smell? NO  Have you been around anyone with known Covid 19. NO  Have you been around anyone who is awaiting Covid 19 test results in the past 7 to 10 days? NO  Have you been around anyone who has been exposed to Covid 19, or has mentioned symptoms of Covid 19 within the past 7 to 10 days? NO  If you have any concerns/questions about symptoms patients report during screening (either on the phone or at threshold). Contact the provider seeing the patient or DOD for further guidance.  If neither are available contact a member of the leadership team.           d

## 2018-12-11 NOTE — Progress Notes (Addendum)
CARDIOLOGY OFFICE NOTE  Date:  12/12/2018    Angela Bond Date of Birth: 03-08-1946 Medical Record #[id number]  PCP:  Gaynelle Arabian, MD  Cardiologist:  Marisa Cyphers    Chief Complaint  Patient presents with  . Follow-up    Seen for Dr. Marlou Porch    History of Present Illness: Angela Bond is a 73 y.o. female who presents today for a follow up visit. Seen for Dr. Marlou Porch.  She has a complex history - she hasCAD s/pnon-STEMI with drug-eluting stent to her LAD in 2017(99% bifurcation lesion in the LAD that was stented and had a jailed diagonal that was dilated), orthostatic hypotension, right breast CA, GERD, HTN and HLD. She had an echo and was recathed in 2019 after presenting with chest pain to the ER - continues to be managed medically.   She has been intolerant to several medicines. Metoprolol stopped in the past due to dizziness. She has had her ACE stopped as well due to orthostasis.Has tended to have lots of somatic complaints.   The patient does not have symptoms concerning for COVID-19 infection (fever, chills, cough, or new shortness of breath).   Comes in today. Here alone. She notes her vision is blurry. She had a fall earlier this month - tripped on uneven concrete - has a broken elbow on the right. Bruise down left face.  She is having surgery this Friday. She remains on her Plavix. We were not asked to give to clearance. This has not been stopped.  She has started walking - goes from her house on [address] down to [address] - she will do this a few times a week. No chest pain reported. She is trying to keep up her house - plus take care of her husband with a memory disorder but her elbow has hindered ability to do her activities. She wonders if she still has a UTI - she was given Keflex for Ecoli noted on urine culture - still with some pressure in that area - she is going to call Dr. Marisue Humble. She takes her Lasix and potassium every 3 days.  Overall, she feels like she is ok except for the elbow and the recent UTI.   Past Medical History:  Diagnosis Date  . Anemia    hx  . Anxiety and depression   . Aortic atherosclerosis ([place]) 07/31/2017  . Arthritis    "~ all over my body; in the joints"  . Breast cancer, right ([place]) 3/16  . Coronary artery disease    a.  s/p NSTEMI in June 2017 with DES to mid LAD.  Marland Kitchen Depression   . Gallstones   . GERD (gastroesophageal reflux disease)   . H/O hiatal hernia   . Headache   . Heart murmur   . History of blood transfusion    "when taking chemo & w/one of my knee replacements" (11/05/2015)  . Hypercholesteremia   . Hypertension   . Iron deficiency anemia 02/16/2017  . Multiple bruises   . Myocardial infarction ([place])   . Orthostatic hypotension   . Personal history of chemotherapy   . Personal history of radiation therapy   . Pneumonia X 1  . Shingles 5/15   hx  . Sliding hiatal hernia s/p lap PEH repair 07/31/2012  . Unstable angina ([place]) 01/23/2012  . Unsteady gait    " I fall easily"  . Wears dentures    partial  . Wears glasses  Past Surgical History:  Procedure Laterality Date  . BREAST BIOPSY Right   . BREAST LUMPECTOMY Right 08/2014  . BREAST LUMPECTOMY WITH SENTINEL LYMPH NODE BIOPSY Right 09/22/2014   Procedure: RIGHT BREAST LUMPECTOMY WITH NEEDLE LOCALIZATION AND RIGHT AXILLARY SENTINEL LYMPH NODE BIOPSY;  Surgeon: [name], MD;  Location: [facility];  Service: General;  Laterality: Right;  . CARDIAC CATHETERIZATION    . CARDIAC CATHETERIZATION N/A 11/05/2015   Procedure: Left Heart Cath and Coronary Angiography;  Surgeon: Belva Crome, MD;  Location: [facility];  Service: Cardiovascular;  Laterality: N/A;  . CARDIAC CATHETERIZATION N/A 11/05/2015   Procedure: Coronary Stent Intervention;  Surgeon: Belva Crome, MD;  Location: [facility];  Service: Cardiovascular;  Laterality: N/A;  . CARPAL TUNNEL RELEASE Right 09/2015  . CHOLECYSTECTOMY N/A  03/28/2018   Procedure: LAPAROSCOPIC CHOLECYSTECTOMY WITH INTRAOPERATIVE CHOLANGIOGRAM ERAS PATHWAY;  Surgeon: Jovita Kussmaul, MD;  Location: [facility];  Service: General;  Laterality: N/A;  . CORONARY ANGIOPLASTY WITH STENT PLACEMENT  11/05/2015   "1 stent"  . ESOPHAGEAL MANOMETRY N/A 07/02/2012   Procedure: ESOPHAGEAL MANOMETRY (EM);  Surgeon: Lear Ng, MD;  Location: [facility];  Service: Endoscopy;  Laterality: N/A;  schooler to read  . EYE SURGERY Left 2013   tube placed tear duct  . HIATAL HERNIA REPAIR N/A 08/10/2012   Procedure: LAPAROSCOPIC REPAIR OF HIATAL HERNIA WITH MESH AND EGD WITH PEG TUBE PLACEMENT;  Surgeon: Ralene Ok, MD;  Location: [facility];  Service: General;  Laterality: N/A;  . LEFT HEART CATH AND CORONARY ANGIOGRAPHY N/A 08/01/2017   Procedure: LEFT HEART CATH AND CORONARY ANGIOGRAPHY;  Surgeon: Jettie Booze, MD;  Location: [facility];  Service: Cardiovascular;  Laterality: N/A;  . PORT-A-CATH REMOVAL  06/2015  . PORTACATH PLACEMENT Left 09/22/2014   Procedure: INSERTION PORT-A-CATH;  Surgeon: [name], MD;  Location: [facility];  Service: General;  Laterality: Left;  . POSTERIOR LUMBAR FUSION  2016   "had 2 vertebrae fused"  . TONSILLECTOMY  as child  . TOTAL KNEE ARTHROPLASTY Bilateral 2011,2012     Medications: Current Meds  Medication Sig  . acetaminophen (TYLENOL) 500 MG tablet Take 1,000 mg by mouth every 6 (six) hours as needed for moderate pain or headache.  Marland Kitchen aspirin EC 81 MG EC tablet Take 1 tablet (81 mg total) by mouth daily.  . Cholecalciferol (VITAMIN D) 2000 UNITS tablet Take 2,000 Units by mouth 2 (two) times daily.   . clopidogrel (PLAVIX) 75 MG tablet Take 1 tablet (75 mg total) by mouth daily.  Marland Kitchen dexlansoprazole (DEXILANT) 60 MG capsule Take 60 mg by mouth daily.  Marland Kitchen escitalopram (LEXAPRO) 20 MG tablet Take 20 mg by mouth daily.  Marland Kitchen ezetimibe (ZETIA) 10 MG tablet Take 1 tablet (10 mg total) by mouth at bedtime.  .  famotidine (PEPCID) 20 MG tablet Take 20 mg by mouth daily.  . fexofenadine (ALLEGRA) 180 MG tablet Take 180 mg by mouth daily as needed for allergies.   . furosemide (LASIX) 20 MG tablet Take one tablet daily as needed for 3 lbs over night weight gain  . gabapentin (NEURONTIN) 600 MG tablet Take 1 tablet (600 mg total) by mouth 3 (three) times daily.  Marland Kitchen HYDROcodone-acetaminophen (NORCO/VICODIN) 5-325 MG tablet Take 1 tablet by mouth every 4 (four) hours as needed for moderate pain.  Marland Kitchen LORazepam (ATIVAN) 1 MG tablet Take 1 mg by mouth daily.  Marland Kitchen lovastatin (MEVACOR) 40 MG tablet  TAKE 1 TABLET BY MOUTH AT  BEDTIME  . Magnesium 500 MG TABS Take 500 mg by mouth at bedtime.  . meclizine (ANTIVERT) 25 MG tablet Take 12.5-25 mg by mouth 3 (three) times daily as needed for dizziness.  . montelukast (SINGULAIR) 10 MG tablet Take 10 mg by mouth at bedtime.  . nitroGLYCERIN (NITROSTAT) 0.4 MG SL tablet Place 1 tablet (0.4 mg total) under the tongue every 5 (five) minutes as needed for chest pain (CP or SOB).  Marland Kitchen nystatin cream (MYCOSTATIN) Apply 1 application topically 2 (two) times daily.  . ondansetron (ZOFRAN) 4 MG tablet Take 1 tablet (4 mg total) by mouth every 6 (six) hours as needed for nausea or vomiting.  . potassium chloride (K-DUR) 10 MEQ tablet Take 10 mEq by mouth every 3 (three) days.  . sodium chloride (OCEAN) 0.65 % SOLN nasal spray Place 2 sprays into both nostrils as needed for congestion.  Marland Kitchen tiZANidine (ZANAFLEX) 2 MG tablet Take 2-4 mg by mouth every 8 (eight) hours as needed for muscle spasms.  . vitamin C (ASCORBIC ACID) 500 MG tablet Take 500 mg by mouth daily.     Allergies: Allergies  Allergen Reactions  . Shellfish Allergy Anaphylaxis and Other (See Comments)    ANAPHYLAXIS TO OYSTERS  . Aspirin Other (See Comments)    Burns stomach  . Penicillins Nausea And Vomiting and Other (See Comments)    PATIENT HAS HAD A PCN REACTION WITH IMMEDIATE RASH, FACIAL/TONGUE/THROAT  SWELLING, SOB, OR LIGHTHEADEDNESS WITH HYPOTENSION:  #  #  YES  #  #  Has patient had a PCN reaction causing severe rash involving mucus membranes or skin necrosis: No Has patient had a PCN reaction that required hospitalization: No Has patient had a PCN reaction occurring within the last 10 years: No If all of the above answers are "NO", then may proceed with Cephalosporin use.   [name] [Naproxen] Nausea And Vomiting  . Brilinta [Ticagrelor] Other (See Comments)    Indigestion  . Ciprofloxacin Nausea And Vomiting  . Doxycycline Nausea And Vomiting  . Evista [Raloxifene] Other (See Comments)    LEG ACHES  . Fosamax [Alendronate Sodium] Other (See Comments)    Leg aches  . Lactose Intolerance (Gi) Nausea And Vomiting  . Latex Rash  . Naproxen Sodium Nausea And Vomiting  . Nitrofurantoin Rash and Other (See Comments)    "I broke out down there"  . Pravachol [Pravastatin] Other (See Comments)    GI upset   . Sulfa Antibiotics Nausea And Vomiting  . Tape Rash  . Welchol [Colesevelam Hcl] Other (See Comments)    Stomach cramps  . Zocor [Simvastatin] Rash    Social History: The patient  reports that she has never smoked. She has never used smokeless tobacco. She reports that she does not drink alcohol or use drugs.   Family History: The patient's family history includes Cancer in her father; Cancer (age of onset: 28) in her sister; Diabetes in her father and sister; Heart disease in her father, mother, and sister.   Review of Systems: Please see the history of present illness.   All other systems are reviewed and negative.   Physical Exam: VS:  BP 130/78   Pulse 70   Ht 4\' 8"  (1.422 m)   Wt 160 lb 12.8 oz (72.9 kg)   SpO2 99%   BMI 36.05 kg/m  .  BMI Body mass index is 36.05 kg/m.  Wt Readings from Last 3 Encounters:  12/12/18  160 lb 12.8 oz (72.9 kg)  11/22/18 161 lb 4 oz (73.1 kg)  09/06/18 169 lb (76.7 kg)    General: Pleasant. Elderly. Alert and in no acute  distress.  Her right arm is in a sling.  HEENT: Normal. Resolving bruise down the left side of her face.  Neck: Supple, no JVD, carotid bruits, or masses noted.  Cardiac: Regular rate and rhythm. No murmurs, rubs, or gallops. No edema.  Respiratory:  Lungs are clear to auscultation bilaterally with normal work of breathing.  GI: Soft and nontender.  MS: No deformity or atrophy. Gait and ROM intact.  Skin: Warm and dry. Color is normal. She tends to pick at her skin. Multiple ulcerations noted.  Neuro:  Strength and sensation are intact and no gross focal deficits noted.  Psych: Alert, appropriate and with normal affect.   LABORATORY DATA:  EKG:  EKG is not ordered today.  EKG from 11/26/2018 noted - unchanged - NSR with no acute changes.   Lab Results  Component Value Date   WBC 7.9 11/23/2018   HGB 11.7 (L) 11/23/2018   HCT 37.4 11/23/2018   PLT 199 11/23/2018   GLUCOSE 130 (H) 11/23/2018   CHOL 153 08/01/2017   TRIG 63 08/01/2017   HDL 59 08/01/2017   LDLCALC 81 08/01/2017   ALT 13 08/18/2018   AST 18 08/18/2018   NA 138 11/23/2018   K 3.5 11/23/2018   CL 101 11/23/2018   CREATININE 0.97 11/23/2018   BUN 9 11/23/2018   CO2 26 11/23/2018   TSH 1.074 08/16/2018   INR 1.05 08/01/2017   HGBA1C 5.5 11/05/2015       BNP (last 3 results) No results for input(s): BNP in the last 8760 hours.  ProBNP (last 3 results) No results for input(s): PROBNP in the last 8760 hours.   Other Studies Reviewed Today:  Cardiac catheterization 07/31/17:   Mid RCA lesion is 25% stenosed.  Previously placed Prox LAD stent, DES,is widely patent.  Ost 1st Diag lesion is 75% stenosed. The ostium is jailed by the stent. THis is a small vessel.  Mid LAD lesion is 25% stenosed.  The left ventricular systolic function is normal.  LV end diastolic pressure is mildly elevated.  The left ventricular ejection fraction is 55-65% by visual estimate.  There is no aortic valve stenosis.   Continue medical therapy. I encouraged her to be more active to try to prevent deconditioning.    EchoStudy Conclusions3/2019  - Left ventricle: The cavity size was normal. Systolic function was normal. The estimated ejection fraction was in the range of 55% to 60%. Wall motion was normal; there were no regional wall motion abnormalities. Left ventricular diastolic function parameters were normal. - Pulmonary arteries: PA peak pressure: 32 mm Hg (S).  NUC stress 01/16/16: 1. Septal scar/infarction. No findings for ischemia.  2. Septal wall motion abnormality and bulging of the inferoseptal wall with contraction.  3. Left ventricular ejection fraction 68%  4. Non invasive risk stratification*: Intermediate to low risk.    Assessment & Plan:  1.Coronary artery diseasewith prior MI June 2017 with NSTEMI and DES to the mid LAD- she was recathed this past March of 2019 for chest pain - stable findings and stable echo - she has not tolerated beta blocker in the past - she has not been able to be on ACE due to prior othostatic hypotension. She is basically just on Plavix and every 3rd day diuretic. No chest pain noted.She is  trying to walk more - can do 4 mets of activity. No changes made today.   2. Recurrent fall - for elbow surgery on Friday - ok from our standpoint - she remains on her Plavix.   3. HLD - on statin -  needs follow up lab today.   4. Orthostasis- no frank syncope. She tripped and suffered this most recent injury.   5. Remotepostoperative atrial fibrillation in 2014during PEG tube placement & EGD. She has not had recurrence. No anticoagulation warranted - she is quite high risk for anticoagulation unfortunately due to her frequency of falls.   6. Depression-may be out of her Lexapro - she is going home to look and see if she needs refills and talk with PCP. She seems more upbeat to me today.   7. Lower extremity edema - tends to  be chronic - not too bad today.   8. History of syncope due to severe hypokalemia from diuretic use - only using every 3rd day - last potassium 3.5 - rechecking today.   9. UTI - she is going to touch base with PCP - she is not sure if this is resolved or not.   10.  COVID-19 Education: The signs and symptoms of COVID-19 were discussed with the patient and how to seek care for testing (follow up with PCP or arrange E-visit).  The importance of social distancing, staying at home, hand hygiene and wearing a mask when out in public were discussed today.  Current medicines are reviewed with the patient today.  The patient does not have concerns regarding medicines other than what has been noted above.  The following changes have been made:  See above.  Labs/ tests ordered today include:    Orders Placed This Encounter  Procedures  . Basic metabolic panel  . Hepatic function panel  . Lipid panel     Disposition:   FU with Dr. Marlou Porch in 4 months; see me in 8 months.  Patient is agreeable to this plan and will call if any problems develop in the interim.   SignedTruitt Merle, NP  12/12/2018 11:01 AM  [facility] [address] [place], [place]  [address] Phone: [phone] Fax: [phone]      Addendum: Patient was taken to the lobby to get her labs done - she apparently sat down - got up and passed out - a visitor witnessed the event - said she fell into the wall. She has a gash over the right eye - bleeding and immediate swelling. She notes her head hurts. She is able to move all extremities. BP was 160/80. HR was in the 70's and oxygen sat was 98%. EMS was called for transport to the ER. Her ride "Marya Amsler" was called to give an update per her request. Message left for her son - voice mail full for her daughter - regarding patient being transported to the Er.   Noted bed bugs crawling on her and the floor underneath - EMS was alerted.  This will need to be addressed. This was not noted during her visit but she does have a tendency to "pick" chronically.   Will ask social worker to see if there is anything that can be done. Home situation is most likely poor - husband with memory disorder. Will alert Dr. Burney Gauze as well - I suspect her surgery for Friday will be postponed pending this outcome.   Burtis Junes, RN, ANP-C [facility] [address] [place], [place]  [address] [phone]

## 2018-12-12 ENCOUNTER — Encounter: Payer: Self-pay | Admitting: Nurse Practitioner

## 2018-12-12 ENCOUNTER — Other Ambulatory Visit: Payer: Self-pay

## 2018-12-12 ENCOUNTER — Encounter (HOSPITAL_COMMUNITY): Payer: Self-pay | Admitting: *Deleted

## 2018-12-12 ENCOUNTER — Observation Stay (HOSPITAL_COMMUNITY)
Admission: EM | Admit: 2018-12-12 | Discharge: 2018-12-13 | Disposition: A | Discharge status: Home / Self Care | Payer: Medicare Other | Attending: Internal Medicine | Admitting: Internal Medicine

## 2018-12-12 ENCOUNTER — Emergency Department (HOSPITAL_COMMUNITY): Payer: Medicare Other

## 2018-12-12 ENCOUNTER — Ambulatory Visit (INDEPENDENT_AMBULATORY_CARE_PROVIDER_SITE_OTHER): Payer: Medicare Other | Admitting: Nurse Practitioner

## 2018-12-12 ENCOUNTER — Telehealth: Payer: Self-pay | Admitting: Pharmacist

## 2018-12-12 VITALS — BP 130/78 | HR 70 | Ht <= 58 in | Wt 160.8 lb

## 2018-12-12 DIAGNOSIS — I951 Orthostatic hypotension: Secondary | ICD-10-CM | POA: Diagnosis present

## 2018-12-12 DIAGNOSIS — K219 Gastro-esophageal reflux disease without esophagitis: Secondary | ICD-10-CM | POA: Diagnosis not present

## 2018-12-12 DIAGNOSIS — W01198A Fall on same level from slipping, tripping and stumbling with subsequent striking against other object, initial encounter: Secondary | ICD-10-CM | POA: Diagnosis not present

## 2018-12-12 DIAGNOSIS — E7800 Pure hypercholesterolemia, unspecified: Secondary | ICD-10-CM

## 2018-12-12 DIAGNOSIS — I480 Paroxysmal atrial fibrillation: Secondary | ICD-10-CM | POA: Diagnosis not present

## 2018-12-12 DIAGNOSIS — S0231XA Fracture of orbital floor, right side, initial encounter for closed fracture: Principal | ICD-10-CM | POA: Insufficient documentation

## 2018-12-12 DIAGNOSIS — S098XXA Other specified injuries of head, initial encounter: Secondary | ICD-10-CM | POA: Diagnosis present

## 2018-12-12 DIAGNOSIS — I10 Essential (primary) hypertension: Secondary | ICD-10-CM

## 2018-12-12 DIAGNOSIS — Z20828 Contact with and (suspected) exposure to other viral communicable diseases: Secondary | ICD-10-CM | POA: Insufficient documentation

## 2018-12-12 DIAGNOSIS — R8271 Bacteriuria: Secondary | ICD-10-CM | POA: Diagnosis present

## 2018-12-12 DIAGNOSIS — W57XXXA Bitten or stung by nonvenomous insect and other nonvenomous arthropods, initial encounter: Secondary | ICD-10-CM | POA: Diagnosis not present

## 2018-12-12 DIAGNOSIS — Y92531 Health care provider office as the place of occurrence of the external cause: Secondary | ICD-10-CM | POA: Diagnosis not present

## 2018-12-12 DIAGNOSIS — D509 Iron deficiency anemia, unspecified: Secondary | ICD-10-CM | POA: Diagnosis not present

## 2018-12-12 DIAGNOSIS — Y999 Unspecified external cause status: Secondary | ICD-10-CM | POA: Diagnosis not present

## 2018-12-12 DIAGNOSIS — R296 Repeated falls: Secondary | ICD-10-CM | POA: Insufficient documentation

## 2018-12-12 DIAGNOSIS — C50411 Malignant neoplasm of upper-outer quadrant of right female breast: Secondary | ICD-10-CM | POA: Diagnosis not present

## 2018-12-12 DIAGNOSIS — E782 Mixed hyperlipidemia: Secondary | ICD-10-CM | POA: Diagnosis not present

## 2018-12-12 DIAGNOSIS — Z23 Encounter for immunization: Secondary | ICD-10-CM | POA: Insufficient documentation

## 2018-12-12 DIAGNOSIS — I2510 Atherosclerotic heart disease of native coronary artery without angina pectoris: Secondary | ICD-10-CM | POA: Diagnosis not present

## 2018-12-12 DIAGNOSIS — Z853 Personal history of malignant neoplasm of breast: Secondary | ICD-10-CM | POA: Diagnosis not present

## 2018-12-12 DIAGNOSIS — N390 Urinary tract infection, site not specified: Secondary | ICD-10-CM | POA: Insufficient documentation

## 2018-12-12 DIAGNOSIS — S0003XA Contusion of scalp, initial encounter: Secondary | ICD-10-CM | POA: Diagnosis not present

## 2018-12-12 DIAGNOSIS — R55 Syncope and collapse: Secondary | ICD-10-CM | POA: Diagnosis not present

## 2018-12-12 DIAGNOSIS — S01111A Laceration without foreign body of right eyelid and periocular area, initial encounter: Secondary | ICD-10-CM | POA: Diagnosis not present

## 2018-12-12 DIAGNOSIS — M79601 Pain in right arm: Secondary | ICD-10-CM | POA: Insufficient documentation

## 2018-12-12 DIAGNOSIS — Y9301 Activity, walking, marching and hiking: Secondary | ICD-10-CM | POA: Insufficient documentation

## 2018-12-12 DIAGNOSIS — W19XXXA Unspecified fall, initial encounter: Secondary | ICD-10-CM | POA: Diagnosis present

## 2018-12-12 DIAGNOSIS — T07XXXA Unspecified multiple injuries, initial encounter: Secondary | ICD-10-CM | POA: Diagnosis not present

## 2018-12-12 DIAGNOSIS — S0292XA Unspecified fracture of facial bones, initial encounter for closed fracture: Secondary | ICD-10-CM | POA: Diagnosis present

## 2018-12-12 LAB — URINALYSIS, ROUTINE W REFLEX MICROSCOPIC
Bilirubin Urine: NEGATIVE
Glucose, UA: NEGATIVE mg/dL
Hgb urine dipstick: NEGATIVE
Ketones, ur: NEGATIVE mg/dL
Nitrite: POSITIVE — AB
Protein, ur: NEGATIVE mg/dL
Specific Gravity, Urine: 1.012 (ref 1.005–1.030)
pH: 7 (ref 5.0–8.0)

## 2018-12-12 LAB — CBC WITH DIFFERENTIAL/PLATELET
Abs Immature Granulocytes: 0.04 10*3/uL (ref 0.00–0.07)
Basophils Absolute: 0 10*3/uL (ref 0.0–0.1)
Basophils Relative: 0 %
Eosinophils Absolute: 0.3 10*3/uL (ref 0.0–0.5)
Eosinophils Relative: 3 %
HCT: 35.2 % — ABNORMAL LOW (ref 36.0–46.0)
Hemoglobin: 11.1 g/dL — ABNORMAL LOW (ref 12.0–15.0)
Immature Granulocytes: 0 %
Lymphocytes Relative: 9 %
Lymphs Abs: 0.9 10*3/uL (ref 0.7–4.0)
MCH: 30 pg (ref 26.0–34.0)
MCHC: 31.5 g/dL (ref 30.0–36.0)
MCV: 95.1 fL (ref 80.0–100.0)
Monocytes Absolute: 0.6 10*3/uL (ref 0.1–1.0)
Monocytes Relative: 6 %
Neutro Abs: 8.3 10*3/uL — ABNORMAL HIGH (ref 1.7–7.7)
Neutrophils Relative %: 82 %
Platelets: 261 10*3/uL (ref 150–400)
RBC: 3.7 MIL/uL — ABNORMAL LOW (ref 3.87–5.11)
RDW: 13.2 % (ref 11.5–15.5)
WBC: 10.2 10*3/uL (ref 4.0–10.5)
nRBC: 0 % (ref 0.0–0.2)

## 2018-12-12 LAB — BASIC METABOLIC PANEL
Anion gap: 13 (ref 5–15)
BUN: 8 mg/dL (ref 8–23)
CO2: 26 mmol/L (ref 22–32)
Calcium: 8.9 mg/dL (ref 8.9–10.3)
Chloride: 99 mmol/L (ref 98–111)
Creatinine, Ser: 0.7 mg/dL (ref 0.44–1.00)
GFR calc Af Amer: >60 mL/min (ref >60)
GFR calc non Af Amer: >60 mL/min (ref >60)
Glucose, Bld: 121 mg/dL — ABNORMAL HIGH (ref 70–99)
Potassium: 3.8 mmol/L (ref 3.5–5.1)
Sodium: 138 mmol/L (ref 135–145)

## 2018-12-12 MED ORDER — TETANUS-DIPHTH-ACELL PERTUSSIS 5-2.5-18.5 LF-MCG/0.5 IM SUSP
0.5000 mL | Freq: Once | INTRAMUSCULAR | Status: AC
  Administered 2018-12-12: 0.5 mL via INTRAMUSCULAR
  Filled 2018-12-12: qty 0.5

## 2018-12-12 MED ORDER — OXYCODONE HCL 5 MG PO TABS
5.0000 mg | ORAL_TABLET | Freq: Once | ORAL | Status: AC
  Administered 2018-12-12: 5 mg via ORAL
  Filled 2018-12-12: qty 1

## 2018-12-12 MED ORDER — ONDANSETRON 4 MG PO TBDP
4.0000 mg | ORAL_TABLET | Freq: Once | ORAL | Status: AC
  Administered 2018-12-12: 4 mg via ORAL
  Filled 2018-12-12: qty 1

## 2018-12-12 MED ORDER — ACETAMINOPHEN 500 MG PO TABS
1000.0000 mg | ORAL_TABLET | Freq: Once | ORAL | Status: AC
  Administered 2018-12-12: 1000 mg via ORAL
  Filled 2018-12-12: qty 2

## 2018-12-12 MED ORDER — FLUORESCEIN SODIUM 1 MG OP STRP
1.0000 | ORAL_STRIP | Freq: Once | OPHTHALMIC | Status: AC
  Administered 2018-12-12: 1 via OPHTHALMIC
  Filled 2018-12-12: qty 1

## 2018-12-12 MED ORDER — SODIUM CHLORIDE 0.9 % IV SOLN
1.0000 g | Freq: Once | INTRAVENOUS | Status: AC
  Administered 2018-12-13: 1 g via INTRAVENOUS
  Filled 2018-12-12: qty 10

## 2018-12-12 MED ORDER — TETRACAINE HCL 0.5 % OP SOLN
2.0000 [drp] | Freq: Once | OPHTHALMIC | Status: AC
  Administered 2018-12-12: 2 [drp] via OPHTHALMIC
  Filled 2018-12-12: qty 4

## 2018-12-12 NOTE — ED Notes (Signed)
RN attempted to pull blood from IV, with no success. Also 1 attempt of straight stick, unsuccessful

## 2018-12-12 NOTE — ED Triage Notes (Signed)
Arrived by [facility] from dr office. Pt was walking into office for routine checkup and tripped and fell. Hit head on cement, is on plavix. Has hematoma to forehead. No loc. Also has pain to right arm which was recently fractured. c collar applied pta, pt now also has upper back pain. Bed bugs also noted on arrival to triage.

## 2018-12-12 NOTE — Patient Instructions (Addendum)
After Visit Summary:  We will be checking the following labs today - BMET, HPF and lipids   Medication Instructions:    Continue with your current medicines.    If you need a refill on your cardiac medications before your next appointment, please call your pharmacy.     Testing/Procedures To Be Arranged:  N/A  Follow-Up:   See Dr. Marlou Porch in 4 months - see me in 8 months.     At [facility], you and your health needs are our priority.  As part of our continuing mission to provide you with exceptional heart care, we have created designated Provider Care Teams.  These Care Teams include your primary Cardiologist (physician) and Advanced Practice Providers (APPs -  Physician Assistants and Nurse Practitioners) who all work together to provide you with the care you need, when you need it.  Special Instructions:  . Stay safe, stay home, wash your hands for at least 20 seconds and wear a mask when out in public.  . It was good to talk with you today.    Call the [facility] office at [phone] if you have any questions, problems or concerns.

## 2018-12-12 NOTE — ED Provider Notes (Signed)
  Physical Exam  BP 132/70   Pulse 64   Temp 98.1 F (36.7 C) (Oral)   Resp 17   SpO2 93%   Physical Exam Vitals signs and nursing note reviewed.  Constitutional:      General: She is not in acute distress.    Appearance: She is well-developed. She is not diaphoretic.  HENT:     Head: Normocephalic and atraumatic.  Eyes:     General: No scleral icterus.    Conjunctiva/sclera: Conjunctivae normal.  Neck:     Musculoskeletal: Normal range of motion.  Pulmonary:     Effort: Pulmonary effort is normal. No respiratory distress.  Skin:    Findings: No rash.  Neurological:     Mental Status: She is alert.     ED Course/Procedures     Procedures  MDM  Care handed off from previous provider PA Ward.  Please see their note for further detail.  Briefly, patient with a past medical history of hypertension, prior MI, presents to ED for syncope versus fall.  She has history of recurrent falls.  Her lab work here shows UTI and CT orbits showing sub-conjunctival hemorrhages exophthalmos.  Ophthalmology was consulted and evaluated in ED.  He recommends ENT evaluation in the morning.  Plan is to admit to hospitalist. I spoke to Dr. Roel Cluck.     Delia Heady, PA-C 12/12/18 2308    Drenda Freeze, MD 12/12/18 [phone]

## 2018-12-12 NOTE — H&P (Signed)
Angela Bond HBZ:[id number] DOB: [birth year] DOA: 12/12/2018     PCP: Gaynelle Arabian, MD   Outpatient Specialists:  CARDS:  Dr. Candie Chroman,  [facility] [place]  Patient arrived to ER on 12/12/18 at 1159  Patient coming from: home Lives with Husband with memory issues  Chief Complaint:  Chief Complaint  Patient presents with   Fall   Rash    HPI: Angela Bond is a 73 y.o. female with medical history significant onCAD s/pnon-STEMI with drug-eluting stent to her LAD in 2017(99% bifurcation lesion in the LAD that was stented and had a jailed diagonal that was dilated), orthostatic hypotension, right breast CA, GERD, HTN and HLD history of breast cancer, depression, GERD, iron  deficiency anemia    Presented with   Syncope while in cardiology office She had a recent fall on 10 July onto her right extremity was seen in emergency department showed nondisplaced fracture of the distal humerus she was being evaluated for repair by orthopedics. She was in the cardiology office for clearance prior to her repair she was cleared for surgery from cardiology standpoint but was sent out to the lobby to get her labs checked While in the lobby patient had a syncope event as per cardiology note Vital signs stable and she was transferred to [facility] emergency department for further evaluation. Review of records it is still not clear if it was a syncope or fall he was witnessed by another patient per his report patient did not trip She hit her head on a wall and then again on the floor developed significant bleeding from her right eye.  Of note patient is positive for bedbugs  She recently was diagnosed with UTI and given Keflex has been having occasional pressure and now lower abdomen She has had recurrent UTIs  Reports significant weight loss  Infectious risk factors:  Reports none In  ER RAPID COVID TEST NEGATIVE    Regarding pertinent Chronic problems:    Hyperlipidemia - on statins      CAD  - On Plavix  does not tolerate betablocker,                 -  followed by cardiology                - last cardiac cath 07/31/17  Mid RCA lesion is 25% stenosed.  Previously placed Prox LAD stent, DES,is widely patent.  Ost 1st Diag lesion is 75% stenosed. The ostium is jailed by the stent. THis is a small vessel.  Mid LAD lesion is 25% stenosed.  The left ventricular systolic function is normal.  LV end diastolic pressure is mildly elevated.  The left ventricular ejection fraction is 55-65% by visual estimate.  There is no aortic valve stenosis.     A. Fib -  - CHA2DS2 vas score 4 :  Remote  Not on anticoagulation secondary to Risk of Falls,          -  Rate control:   Does not tolerate betablocker     While in ER: CT showed sub-conjunctival hemorrhages exophthalmos.  Ophthalmology was consulted and evaluated in ED.  He recommends ENT evaluation in the morning.  The following Work up has been ordered so far:  Orders Placed This Encounter  Procedures   Urine culture   SARS Coronavirus 2 [facility] order, Performed in [facility] hospital lab)   CT Head Wo Contrast   CT Orbits Wo Contrast  CT Cervical Spine Wo Contrast   DG Shoulder Right   DG Elbow Complete Right   DG Wrist Complete Right   CBC with Differential   Basic metabolic panel   Urinalysis, Routine w reflex microscopic   [place] to bedside   [place] lamp to bedside   Cardiac monitoring   Consult to ophthalmology   Consult to hospitalist  ALL PATIENTS BEING ADMITTED/HAVING PROCEDURES NEED COVID-19 SCREENING   Consult to hospitalist  ALL PATIENTS BEING ADMITTED/HAVING PROCEDURES NEED COVID-19 SCREENING   Droplet and Contact precautions   ED EKG   Place in observation (patient's expected length of stay will be less than 2 midnights)     Following Medications were ordered in ER: Medications  cefTRIAXone (ROCEPHIN) 1 g in sodium chloride 0.9 %  100 mL IVPB (has no administration in time range)  fluorescein ophthalmic strip 1 strip (1 strip Right Eye Given 12/12/18 1857)  tetracaine (PONTOCAINE) 0.5 % ophthalmic solution 2 drop (2 drops Both Eyes Given by Other 12/12/18 1857)  Tdap (BOOSTRIX) injection 0.5 mL (0.5 mLs Intramuscular Given 12/12/18 2132)  acetaminophen (TYLENOL) tablet 1,000 mg (1,000 mg Oral Given 12/12/18 2131)  oxyCODONE (Oxy IR/ROXICODONE) immediate release tablet 5 mg (5 mg Oral Given 12/12/18 2131)  ondansetron (ZOFRAN-ODT) disintegrating tablet 4 mg (4 mg Oral Given 12/12/18 2156)        Consult Orders  (From admission, onward)         Start     Ordered   12/12/18 2224  Consult to hospitalist  ALL PATIENTS BEING ADMITTED/HAVING PROCEDURES NEED COVID-19 SCREENING  Once    Comments: ALL PATIENTS BEING ADMITTED/HAVING PROCEDURES NEED COVID-19 SCREENING  Provider:  (Not yet assigned)  Question Answer Comment  Place call to: [facility]   Reason for Consult Admit      12/12/18 2223   12/12/18 2127  Consult to hospitalist  ALL PATIENTS BEING ADMITTED/HAVING PROCEDURES NEED COVID-19 SCREENING  Once    Comments: ALL PATIENTS BEING ADMITTED/HAVING PROCEDURES NEED COVID-19 SCREENING  Provider:  (Not yet assigned)  Question Answer Comment  Place call to: [facility]   Reason for Consult Admit      12/12/18 2126          ER Provider Called:   Ophthalmology     They Recommend admit to medicine    Will see in ER   Significant initial  Findings: Abnormal Labs Reviewed  CBC WITH DIFFERENTIAL/PLATELET - Abnormal; Notable for the following components:      Result Value   RBC 3.70 (*)    Hemoglobin 11.1 (*)    HCT 35.2 (*)    Neutro Abs 8.3 (*)    All other components within normal limits  BASIC METABOLIC PANEL - Abnormal; Notable for the following components:   Glucose, Bld 121 (*)    All other components within normal limits  URINALYSIS, ROUTINE W REFLEX MICROSCOPIC - Abnormal; Notable for  the following components:   APPearance HAZY (*)    Nitrite POSITIVE (*)    Leukocytes,Ua MODERATE (*)    Bacteria, UA RARE (*)    All other components within normal limits   Otherwise labs showing:    Recent Labs  Lab 12/12/18 1847  NA 138  K 3.8  CO2 26  GLUCOSE 121*  BUN 8  CREATININE 0.70  CALCIUM 8.9    Cr    stable,   Lab Results  Component Value Date   CREATININE 0.70 12/12/2018   CREATININE 0.97 11/23/2018  CREATININE 0.87 08/18/2018    No results for input(s): AST, ALT, ALKPHOS, BILITOT, PROT, ALBUMIN in the last 168 hours. Lab Results  Component Value Date   CALCIUM 8.9 12/12/2018       WBC      Component Value Date/Time   WBC 10.2 12/12/2018 1847   ANC    Component Value Date/Time   NEUTROABS 8.3 (H) 12/12/2018 1847   NEUTROABS 4.5 03/16/2016 1006   ALC No results found for: LYMPHOABS    Plt: Lab Results  Component Value Date   PLT 261 12/12/2018    COVID-19 Labs  No results for input(s): DDIMER, FERRITIN, LDH, CRP in the last 72 hours.  Lab Results  Component Value Date   SARSCOV2NAA NOT DETECTED 08/15/2018    HG/HCT   stable,      Component Value Date/Time   HGB 11.1 (L) 12/12/2018 1847   HGB 11.8 (L) 11/20/2018 1139   HGB 11.2 11/08/2017 1402   HGB 12.6 03/16/2016 1006   HCT 35.2 (L) 12/12/2018 1847   HCT 34.5 11/08/2017 1402   HCT 38.7 03/16/2016 1006    Troponin ordered   UA evidence of UTI     Urine analysis:    Component Value Date/Time   COLORURINE YELLOW 12/12/2018 2115   APPEARANCEUR HAZY (A) 12/12/2018 2115   LABSPEC 1.012 12/12/2018 2115   LABSPEC 1.010 03/17/2015 1216   PHURINE 7.0 12/12/2018 2115   GLUCOSEU NEGATIVE 12/12/2018 2115   GLUCOSEU Negative 03/17/2015 [place] 12/12/2018 2115   BILIRUBINUR NEGATIVE 12/12/2018 2115   BILIRUBINUR Negative 03/17/2015 [place] 12/12/2018 2115   PROTEINUR NEGATIVE 12/12/2018 2115   UROBILINOGEN 0.2 03/17/2015 1216   NITRITE  POSITIVE (A) 12/12/2018 2115   LEUKOCYTESUR MODERATE (A) 12/12/2018 2115   LEUKOCYTESUR Trace 03/17/2015 1216    CT HEAD NON acute, high attenuation subconjunctival hemorrhage of the right globe and exophthalmus (series 4, image 15). There are depressed fractures of the right lamina papyracea with fat and medial rectus herniation     Stable Right humerus fracture  ECG:  Personally reviewed by me showing: HR : 74 Rhythm:  NSR,    no evidence of ischemic changes QTC 450      ED Triage Vitals [12/12/18 1236]  Enc Vitals Group     BP (!) 141/59     Pulse Rate 66     Resp 16     Temp 98.1 F (36.7 C)     Temp Source Oral     SpO2 100 %     Weight      Height      Head Circumference      Peak Flow      Pain Score      Pain Loc      Pain Edu?      Excl. in [place]?   FXTK(24)@       Latest  Blood pressure 132/70, pulse 64, temperature 98.1 F (36.7 C), temperature source Oral, resp. rate 17, SpO2 93 %.     Hospitalist was called for admission for syncope/vs recurrent falls and possible UTI   Review of Systems:    Pertinent positives include: suprapubic pain abdominal pain, falls  Constitutional:  No weight loss, night sweats, Fevers, chills, fatigue, weight loss  HEENT:  No headaches, Difficulty swallowing,Tooth/dental problems,Sore throat,  No sneezing, itching, ear ache, nasal congestion, post nasal drip,  Cardio-vascular:  No chest pain, Orthopnea, PND, anasarca, dizziness, palpitations.no Bilateral lower  extremity swelling  GI:  No heartburn, indigestion, , nausea, vomiting, diarrhea, change in bowel habits, loss of appetite, melena, blood in stool, hematemesis Resp:  no shortness of breath at rest. No dyspnea on exertion, No excess mucus, no productive cough, No non-productive cough, No coughing up of blood.No change in color of mucus.No wheezing. Skin:  no rash or lesions. No jaundice GU:  no dysuria, change in color of urine, no urgency or frequency. No  straining to urinate.  No flank pain.  Musculoskeletal:  No joint pain or no joint swelling. No decreased range of motion. No back pain.  Psych:  No change in mood or affect. No depression or anxiety. No memory loss.  Neuro: no localizing neurological complaints, no tingling, no weakness, no double vision, no gait abnormality, no slurred speech, no confusion  All systems reviewed and apart from [place] all are negative  Past Medical History:   Past Medical History:  Diagnosis Date   Anemia    hx   Anxiety and depression    Aortic atherosclerosis ([place]) 07/31/2017   Arthritis    "~ all over my body; in the joints"   Breast cancer, right ([place]) 3/16   Coronary artery disease    a.  s/p NSTEMI in June 2017 with DES to mid LAD.   Depression    Gallstones    GERD (gastroesophageal reflux disease)    H/O hiatal hernia    Headache    Heart murmur    History of blood transfusion    "when taking chemo & w/one of my knee replacements" (11/05/2015)   Hypercholesteremia    Hypertension    Iron deficiency anemia 02/16/2017   Multiple bruises    Myocardial infarction (HCC)    Orthostatic hypotension    Personal history of chemotherapy    Personal history of radiation therapy    Pneumonia X 1   Shingles 5/15   hx   Sliding hiatal hernia s/p lap PEH repair 07/31/2012   Unstable angina (HCC) 01/23/2012   Unsteady gait    " I fall easily"   Wears dentures    partial   Wears glasses       Past Surgical History:  Procedure Laterality Date   BREAST BIOPSY Right    BREAST LUMPECTOMY Right 08/2014   BREAST LUMPECTOMY WITH SENTINEL LYMPH NODE BIOPSY Right 09/22/2014   Procedure: RIGHT BREAST LUMPECTOMY WITH NEEDLE LOCALIZATION AND RIGHT AXILLARY SENTINEL LYMPH NODE BIOPSY;  Surgeon: [name], MD;  Location: [place];  Service: General;  Laterality: Right;   CARDIAC CATHETERIZATION     CARDIAC CATHETERIZATION N/A 11/05/2015   Procedure: Left Heart Cath and  Coronary Angiography;  Surgeon: Belva Crome, MD;  Location: [place] [facility];  Service: Cardiovascular;  Laterality: N/A;   CARDIAC CATHETERIZATION N/A 11/05/2015   Procedure: Coronary Stent Intervention;  Surgeon: Belva Crome, MD;  Location: [place] [facility];  Service: Cardiovascular;  Laterality: N/A;   CARPAL TUNNEL RELEASE Right 09/2015   CHOLECYSTECTOMY N/A 03/28/2018   Procedure: LAPAROSCOPIC CHOLECYSTECTOMY WITH INTRAOPERATIVE CHOLANGIOGRAM ERAS PATHWAY;  Surgeon: Jovita Kussmaul, MD;  Location: [place];  Service: General;  Laterality: N/A;   CORONARY ANGIOPLASTY WITH STENT PLACEMENT  11/05/2015   "1 stent"   ESOPHAGEAL MANOMETRY N/A 07/02/2012   Procedure: ESOPHAGEAL MANOMETRY (EM);  Surgeon: Lear Ng, MD;  Location: [facility];  Service: Endoscopy;  Laterality: N/A;  schooler to read   EYE SURGERY Left 2013   tube placed  tear duct   HIATAL HERNIA REPAIR N/A 08/10/2012   Procedure: LAPAROSCOPIC REPAIR OF HIATAL HERNIA WITH MESH AND EGD WITH PEG TUBE PLACEMENT;  Surgeon: Ralene Ok, MD;  Location: [facility];  Service: General;  Laterality: N/A;   LEFT HEART CATH AND CORONARY ANGIOGRAPHY N/A 08/01/2017   Procedure: LEFT HEART CATH AND CORONARY ANGIOGRAPHY;  Surgeon: Jettie Booze, MD;  Location: [place] [facility];  Service: Cardiovascular;  Laterality: N/A;   PORT-A-CATH REMOVAL  06/2015   PORTACATH PLACEMENT Left 09/22/2014   Procedure: INSERTION PORT-A-CATH;  Surgeon: [name], MD;  Location: [place];  Service: General;  Laterality: Left;   POSTERIOR LUMBAR FUSION  2016   "had 2 vertebrae fused"   TONSILLECTOMY  as child   TOTAL KNEE ARTHROPLASTY Bilateral 2011,2012    Social History:  Ambulatory   Independently     reports that she has never smoked. She has never used smokeless tobacco. She reports that she does not drink alcohol or use drugs.   Family History:   Family History  Problem Relation Age of Onset   Heart disease Mother         74s, CHF   Heart disease Father        d/o MI at 54   Cancer Father        skin   Diabetes Father    Cancer Sister 77       breast   Diabetes Sister    Heart disease Sister        CABG in mid-60s    Allergies: Allergies  Allergen Reactions   Shellfish Allergy Anaphylaxis and Other (See Comments)    ANAPHYLAXIS TO OYSTERS   Aspirin Other (See Comments)    Burns stomach   Penicillins Nausea And Vomiting and Other (See Comments)    PATIENT HAS HAD A PCN REACTION WITH IMMEDIATE RASH, FACIAL/TONGUE/THROAT SWELLING, SOB, OR LIGHTHEADEDNESS WITH HYPOTENSION:  #  #  YES  #  #  Has patient had a PCN reaction causing severe rash involving mucus membranes or skin necrosis: No Has patient had a PCN reaction that required hospitalization: No Has patient had a PCN reaction occurring within the last 10 years: No If all of the above answers are "NO", then may proceed with Cephalosporin use.    Aleve [Naproxen] Nausea And Vomiting   Brilinta [Ticagrelor] Other (See Comments)    Indigestion   Ciprofloxacin Nausea And Vomiting   Doxycycline Nausea And Vomiting   Evista [Raloxifene] Other (See Comments)    LEG ACHES   Fosamax [Alendronate Sodium] Other (See Comments)    Leg aches   Lactose Intolerance (Gi) Nausea And Vomiting   Latex Rash   Naproxen Sodium Nausea And Vomiting   Nitrofurantoin Rash and Other (See Comments)    "I broke out down there"   Pravachol [Pravastatin] Other (See Comments)    GI upset    Sulfa Antibiotics Nausea And Vomiting   Tape Rash   Welchol [Colesevelam Hcl] Other (See Comments)    Stomach cramps   Zocor [Simvastatin] Rash     Prior to Admission medications   Medication Sig Start Date End Date Taking? Authorizing Provider  acetaminophen (TYLENOL) 500 MG tablet Take 1,000 mg by mouth every 6 (six) hours as needed for moderate pain or headache.    [provider]  aspirin EC 81 MG EC tablet Take 1 tablet (81 mg total) by  mouth daily. 11/06/15   Consuelo Pandy, PA-C  Cholecalciferol (  VITAMIN D) 2000 UNITS tablet Take 2,000 Units by mouth 2 (two) times daily.     [provider]  clopidogrel (PLAVIX) 75 MG tablet Take 1 tablet (75 mg total) by mouth daily. 09/07/18   Jerline Pain, MD  dexlansoprazole (DEXILANT) 60 MG capsule Take 60 mg by mouth daily.    [provider]  escitalopram (LEXAPRO) 20 MG tablet Take 20 mg by mouth daily. 01/22/16   [provider]  ezetimibe (ZETIA) 10 MG tablet Take 1 tablet (10 mg total) by mouth at bedtime. 09/07/18   Jerline Pain, MD  famotidine (PEPCID) 20 MG tablet Take 20 mg by mouth daily. 08/31/18   [provider]  fexofenadine (ALLEGRA) 180 MG tablet Take 180 mg by mouth daily as needed for allergies.     [provider]  furosemide (LASIX) 20 MG tablet Take one tablet daily as needed for 3 lbs over night weight gain 09/07/18   Jerline Pain, MD  gabapentin (NEURONTIN) 600 MG tablet Take 1 tablet (600 mg total) by mouth 3 (three) times daily. 10/18/18   Hyatt, Max T, DPM  HYDROcodone-acetaminophen (NORCO/VICODIN) 5-325 MG tablet Take 1 tablet by mouth every 4 (four) hours as needed for moderate pain. 11/24/18   Orpah Greek, MD  LORazepam (ATIVAN) 1 MG tablet Take 1 mg by mouth daily. 09/04/18   [provider]  lovastatin (MEVACOR) 40 MG tablet TAKE 1 TABLET BY MOUTH AT  BEDTIME 06/29/18   Jerline Pain, MD  Magnesium 500 MG TABS Take 500 mg by mouth at bedtime.    [provider]  meclizine (ANTIVERT) 25 MG tablet Take 12.5-25 mg by mouth 3 (three) times daily as needed for dizziness.    [provider]  montelukast (SINGULAIR) 10 MG tablet Take 10 mg by mouth at bedtime. 08/23/18   [provider]  nitroGLYCERIN (NITROSTAT) 0.4 MG SL tablet Place 1 tablet (0.4 mg total) under the tongue every 5 (five) minutes as needed for chest pain (CP or SOB). 09/07/18   Jerline Pain, MD  nystatin  cream (MYCOSTATIN) Apply 1 application topically 2 (two) times daily. 05/17/18   Fontaine, Belinda Block, MD  ondansetron (ZOFRAN) 4 MG tablet Take 1 tablet (4 mg total) by mouth every 6 (six) hours as needed for nausea or vomiting. 11/24/18   Pollina, Gwenyth Allegra, MD  potassium chloride (K-DUR) 10 MEQ tablet Take 10 mEq by mouth every 3 (three) days.    [provider]  sodium chloride (OCEAN) 0.65 % SOLN nasal spray Place 2 sprays into both nostrils as needed for congestion.    [provider]  tiZANidine (ZANAFLEX) 2 MG tablet Take 2-4 mg by mouth every 8 (eight) hours as needed for muscle spasms. 05/01/18   [provider]  vitamin C (ASCORBIC ACID) 500 MG tablet Take 500 mg by mouth daily.    [provider]   Physical Exam: Blood pressure 132/70, pulse 64, temperature 98.1 F (36.7 C), temperature source Oral, resp. rate 17, SpO2 93 %. 1. General:  in No  Acute distress   Chronically ill  -appearing 2. Psychological: Alert and   Oriented 3. Head/ENT:    Dry Mucous Membranes                          Head  traumatic, neck supple  Poor Dentition 4. SKIN: decreased Skin turgor,  Skin clean Dry and intact no rash 5. Heart: Regular rate and rhythm no  Murmur, no Rub or gallop 6. Lungs:  , no wheezes or crackles   7. Abdomen: Soft,  non-tender, obese  bowel sounds present 8. Lower extremities: no clubbing, cyanosis, no  edema 9. Neurologically Grossly intact, moving all 4 extremities equally   10. MSK: Normal range of motion, Diminished due to pain   All other LABS:     Recent Labs  Lab 12/12/18 1847  WBC 10.2  NEUTROABS 8.3*  HGB 11.1*  HCT 35.2*  MCV 95.1  PLT 261     Recent Labs  Lab 12/12/18 1847  NA 138  K 3.8  CL 99  CO2 26  GLUCOSE 121*  BUN 8  CREATININE 0.70  CALCIUM 8.9     No results for input(s): AST, ALT, ALKPHOS, BILITOT, PROT, ALBUMIN in the last 168 hours.     Cultures:    Component Value  Date/Time   SDES URINE, RANDOM 11/23/2018 2001   [place]  11/23/2018 2001    NONE Performed at [facility], [place] [address], [place], [place] [address]    CULT >=100,000 COLONIES/mL ESCHERICHIA COLI (A) 11/23/2018 2001   REPTSTATUS 11/26/2018 FINAL 11/23/2018 2001     Radiological Exams on Admission: Dg Shoulder Right  Result Date: 12/12/2018 CLINICAL DATA:  73 year old female status post fall with pain. Distal right humerus fracture earlier this month. EXAM: RIGHT SHOULDER - 2+ VIEW COMPARISON:  Right elbow CT 11/24/2018 and earlier. FINDINGS: No glenohumeral joint dislocation. The proximal right humerus appears stable and intact. The visible right clavicle in scapula appear intact. Stable visible right chest and chest wall surgical clips. IMPRESSION: No acute fracture or dislocation identified about the right shoulder. Electronically Signed   By: Genevie Ann M.D.   On: 12/12/2018 17:48   Dg Elbow Complete Right  Result Date: 12/12/2018 CLINICAL DATA:  73 year old female status post fall with pain. Distal humerus fracture at the elbow earlier this month. EXAM: RIGHT ELBOW - COMPLETE 3+ VIEW COMPARISON:  Elbow CT 11/24/2018, and earlier. FINDINGS: Stable radiographic appearance of the elbow since 11/23/2018, with fractures of the distal humerus with anteriorly rotated fragments of the capitellum and trochlea as demonstrated by CT. The proximal radius and ulna appear remain intact. No new No acute osseous abnormality identified. IMPRESSION: Stable distal humerus fracture as depicted by CT on 11/24/2018. Electronically Signed   By: Genevie Ann M.D.   On: 12/12/2018 17:55   Dg Wrist Complete Right  Result Date: 12/12/2018 CLINICAL DATA:  73 year old female status post fall with pain. Distal right humerus fracture earlier this month. EXAM: RIGHT WRIST - COMPLETE 3+ VIEW COMPARISON:  Right hand series 05/13/2013. FINDINGS: Chronic appearing fragmentation at the distal pole of the scaphoid, which  seems to be a remnant of the trapezium. The trapezoid appears sclerotic and diminutive. There is also cortical irregularity along the radial aspect of the lunate. The distal radius and ulna appear intact.  Thumb IP osteoarthritis. IMPRESSION: 1. 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 2. Difficult to exclude acute fracture of the lunate, although cortical irregularity along its radial margin might also be chronic. 3. Distal radius and ulna intact. Electronically Signed   By: Genevie Ann M.D.   On: 12/12/2018 17:53   Ct Head Wo Contrast  Result Date: 12/12/2018 CLINICAL DATA:  Trip and fall,  anticoagulation EXAM: CT HEAD WITHOUT CONTRAST CT ORBITS WITHOUT CONTRAST CT CERVICAL SPINE WITHOUT CONTRAST TECHNIQUE: Multidetector CT imaging of the head, cervical spine, and orbits were performed using the standard protocol without intravenous contrast. Multiplanar CT image reconstructions of the cervical spine and maxillofacial structures were also generated. COMPARISON:  10/25/2018 FINDINGS: CT HEAD FINDINGS Brain: No evidence of acute infarction, hemorrhage, hydrocephalus, extra-axial collection or mass lesion/mass effect. Vascular: No hyperdense vessel or unexpected calcification. CT ORBITS FINDINGS Skull: Normal. Negative for fracture or focal lesion. Orbits: There is high attenuation subconjunctival hemorrhage of the right globe and exophthalmus (series 4, image 15). There are depressed fractures of the right lamina papyracea with fat and medial rectus herniation (series 14, image 30). The orbital floor is intact. Sinuses: Partial opacification of the right ethmoid air cells. Other: Right frontal scalp and preseptal hematoma. CT CERVICAL SPINE FINDINGS Alignment: Normal. Skull base and vertebrae: No acute fracture. No primary bone lesion or focal pathologic process. Soft tissues and spinal canal: No prevertebral fluid or swelling. No visible canal hematoma.  Disc levels: Mild multilevel disc space height loss and osteophytosis. Upper chest: Negative. Other: None. IMPRESSION: 1.  No acute intracranial pathology. 2. There is high attenuation subconjunctival hemorrhage of the right globe and exophthalmus (series 4, image 15). There are depressed fractures of the right lamina papyracea with fat and medial rectus herniation (series 14, image 30). The orbital floor is intact. 3.  Right frontal scalp and preseptal hematoma. 4.  No fracture or static subluxation of the cervical spine. Electronically Signed   By: Eddie Candle M.D.   On: 12/12/2018 17:07   Ct Cervical Spine Wo Contrast  Result Date: 12/12/2018 CLINICAL DATA:  Trip and fall, anticoagulation EXAM: CT HEAD WITHOUT CONTRAST CT ORBITS WITHOUT CONTRAST CT CERVICAL SPINE WITHOUT CONTRAST TECHNIQUE: Multidetector CT imaging of the head, cervical spine, and orbits were performed using the standard protocol without intravenous contrast. Multiplanar CT image reconstructions of the cervical spine and maxillofacial structures were also generated. COMPARISON:  10/25/2018 FINDINGS: CT HEAD FINDINGS Brain: No evidence of acute infarction, hemorrhage, hydrocephalus, extra-axial collection or mass lesion/mass effect. Vascular: No hyperdense vessel or unexpected calcification. CT ORBITS FINDINGS Skull: Normal. Negative for fracture or focal lesion. Orbits: There is high attenuation subconjunctival hemorrhage of the right globe and exophthalmus (series 4, image 15). There are depressed fractures of the right lamina papyracea with fat and medial rectus herniation (series 14, image 30). The orbital floor is intact. Sinuses: Partial opacification of the right ethmoid air cells. Other: Right frontal scalp and preseptal hematoma. CT CERVICAL SPINE FINDINGS Alignment: Normal. Skull base and vertebrae: No acute fracture. No primary bone lesion or focal pathologic process. Soft tissues and spinal canal: No prevertebral fluid or  swelling. No visible canal hematoma. Disc levels: Mild multilevel disc space height loss and osteophytosis. Upper chest: Negative. Other: None. IMPRESSION: 1.  No acute intracranial pathology. 2. There is high attenuation subconjunctival hemorrhage of the right globe and exophthalmus (series 4, image 15). There are depressed fractures of the right lamina papyracea with fat and medial rectus herniation (series 14, image 30). The orbital floor is intact. 3.  Right frontal scalp and preseptal hematoma. 4.  No fracture or static subluxation of the cervical spine. Electronically Signed   By: Eddie Candle M.D.   On: 12/12/2018 17:07   Ct Orbits Wo Contrast  Result Date: 12/12/2018 CLINICAL DATA:  Trip and fall, anticoagulation EXAM: CT HEAD WITHOUT CONTRAST CT ORBITS WITHOUT CONTRAST CT CERVICAL SPINE  WITHOUT CONTRAST TECHNIQUE: Multidetector CT imaging of the head, cervical spine, and orbits were performed using the standard protocol without intravenous contrast. Multiplanar CT image reconstructions of the cervical spine and maxillofacial structures were also generated. COMPARISON:  10/25/2018 FINDINGS: CT HEAD FINDINGS Brain: No evidence of acute infarction, hemorrhage, hydrocephalus, extra-axial collection or mass lesion/mass effect. Vascular: No hyperdense vessel or unexpected calcification. CT ORBITS FINDINGS Skull: Normal. Negative for fracture or focal lesion. Orbits: There is high attenuation subconjunctival hemorrhage of the right globe and exophthalmus (series 4, image 15). There are depressed fractures of the right lamina papyracea with fat and medial rectus herniation (series 14, image 30). The orbital floor is intact. Sinuses: Partial opacification of the right ethmoid air cells. Other: Right frontal scalp and preseptal hematoma. CT CERVICAL SPINE FINDINGS Alignment: Normal. Skull base and vertebrae: No acute fracture. No primary bone lesion or focal pathologic process. Soft tissues and spinal canal: No  prevertebral fluid or swelling. No visible canal hematoma. Disc levels: Mild multilevel disc space height loss and osteophytosis. Upper chest: Negative. Other: None. IMPRESSION: 1.  No acute intracranial pathology. 2. There is high attenuation subconjunctival hemorrhage of the right globe and exophthalmus (series 4, image 15). There are depressed fractures of the right lamina papyracea with fat and medial rectus herniation (series 14, image 30). The orbital floor is intact. 3.  Right frontal scalp and preseptal hematoma. 4.  No fracture or static subluxation of the cervical spine. Electronically Signed   By: Eddie Candle M.D.   On: 12/12/2018 17:07    Chart has been reviewed    Assessment/Plan   73 y.o. female with medical history significant onCAD s/pnon-STEMI with drug-eluting stent to her LAD in 2017(99% bifurcation lesion in the LAD that was stented and had a jailed diagonal that was dilated), orthostatic hypotension, right breast CA, GERD, HTN and HLD history of breast cancer, depression, GERD, iron  deficiency anemia      Admitted for syncope vs fall with possible UTi  Present on Admission:  Syncope vs Fall - cycle ce, monitor on tele repeat echo, check carotid dopplers Suspect orthostatic hypotension is contributing.  We will gently rehydrate Get PT OT evaluate,  orthostatic vital signs prior to discharge   Paroxysmal atrial fibrillation (HCC)           - CHA2DS2 vas score 4 :  Not on anticoagulation secondary to Risk of Falls,  recurrent bleeding         -  Rate control:  does not tolereate betablocker    Hypertension - allow permissive HTn for tonight, patient states her usual blood pressure runs closer to 120s we will gently rehydrate may she may tolerateusual BP runs in 120 Will gently rehydrate pt may benefit from midodrine given hx of orthostasis   GERD (gastroesophageal reflux disease) chronic continue home medications   Hypercholesteremia chronic continue home  medications    Breast cancer of upper-outer quadrant of right female breast ([place]) -not an active problem at this time   Orthostatic hypotension- gently rehydrate can trigger into recurrent falls/syncope   CAD (coronary artery disease), native coronary artery - chronic hold Plavix   Iron deficiency anemia - chronic at baseline, had to have iron infusions in the past    UTI -  - treat with Rocephin     await results of urine culture and adjust antibiotic coverage as needed  -bedbug infestation - contact precautions, likely benefit from social work consult patient and family likely will need additional  help at home  Significant weight loss will have nutrition evaluate  Other plan as per orders.  DVT prophylaxis:  SCD   Code Status:  FULL CODE  as per patient   I had personally discussed CODE STATUS with patient   Family Communication:   Family not at  Bedside   Disposition Plan:     likely will need placement for rehabilitation                                             Would benefit from PT/OT eval prior to DC  Ordered                                   Social Work  consulted                   Nutrition    consulted                                      Consults called:  emailed cardiology to let them know pt has been admittted   Admission status:  ED Disposition    ED Disposition Condition [place]: [place] [100100]  Level of Care: Telemetry Cardiac [103]  I expect the patient will be discharged within 24 hours: No (not a candidate for 5C-Observation unit)  Covid Evaluation: Asymptomatic Screening Protocol (No Symptoms)  Diagnosis: Syncope [206001]  Admitting Physician: Toy Baker [[id number]]  Attending Physician: Toy Baker [[id number]]  PT Class (Do Not Modify): Observation [104]  PT Acc Code (Do Not Modify): Observation [10022]        Obs    Level of care    tele  For 12H   Precautions:    Droplet and Contact  precautions  PPE: Used by the provider:   P100  eye Goggles,  Gloves  gown   Amberlyn Martinezgarcia 12/13/2018, 12:41 AM    [facility]     after 2 AM please page floor coverage PA If 7AM-7PM, please contact the day team taking care of the patient using [email or web address]

## 2018-12-12 NOTE — Telephone Encounter (Signed)
Patient seen at [facility] for office visit with Truitt Merle. Pt proceeded to lab for lab draw, however lost her balance and fell. She hit her head on the wall, fell on the floor, and bumped her head again on the floor. Fall witnessed by another patient, Dwana Melena [phone]). Reported pt did not trip on anything. Pt takes Plavix, notable bleeding in her right eye. Ice pack applied, vitals checked at 11:16am. BP 160/90, HR 78, O2sat 98%. Pt conscious the entire encounter. Of note, pt also had a fall 2 weeks ago and broke her right elbow, arm was in a sling today. EMS arrived within 10 minutes and pt was transported to [facility]. Of note, pt also positive for bedbugs.

## 2018-12-12 NOTE — ED Provider Notes (Signed)
[facility] EMERGENCY DEPARTMENT Provider Note   CSN: [id number] Arrival date & time: 12/12/18  1159     History   Chief Complaint Chief Complaint  Patient presents with   Fall   Rash    HPI AIRIANNA KREISCHER is a 73 y.o. female.     The history is provided by the patient and medical records. No language interpreter was used.  Fall Associated symptoms include headaches.   ELYSA WOMAC is a 73 y.o. female  with an extensive PMH as listed below who presents to the Emergency Department for evaluation after a fall just prior to arrival.  Patient was at her routine cardiology appointment and was walking over to the lab.  She is unsure exactly what happened, but feels as if her left leg just gave out.  She denies any weakness, numbness or syncopal type of episode.  She did fall hitting her head/face and landed on her right elbow.  Unfortunately, she is being followed by Dr. Burney Gauze for a fracture of her distal humerus with plans for surgery on Friday.  She feels as if her elbow hurts more now than it was previously and concerned she may have damaged it more.  Denies any numbness or weakness in any extremities.  Is having difficulty with her vision out of her right eye, but is unsure if it is because it is so swollen that she cannot open it or if it is true visual loss.  Past Medical History:  Diagnosis Date   Anemia    hx   Anxiety and depression    Aortic atherosclerosis ([place]) 07/31/2017   Arthritis    "~ all over my body; in the joints"   Breast cancer, right ([place]) 3/16   Coronary artery disease    a.  s/p NSTEMI in June 2017 with DES to mid LAD.   Depression    Gallstones    GERD (gastroesophageal reflux disease)    H/O hiatal hernia    Headache    Heart murmur    History of blood transfusion    "when taking chemo & w/one of my knee replacements" (11/05/2015)   Hypercholesteremia    Hypertension    Iron deficiency anemia 02/16/2017    Multiple bruises    Myocardial infarction (HCC)    Orthostatic hypotension    Personal history of chemotherapy    Personal history of radiation therapy    Pneumonia X 1   Shingles 5/15   hx   Sliding hiatal hernia s/p lap PEH repair 07/31/2012   Unstable angina ([place]) 01/23/2012   Unsteady gait    " I fall easily"   Wears dentures    partial   Wears glasses     Patient Active Problem List   Diagnosis Date Noted   Suspected Covid-19 Virus Infection 08/17/2018   Asymptomatic bacteriuria 08/17/2018   Hypomagnesemia 08/15/2018   Fever 08/15/2018   Gallstones 03/28/2018   Obesity (BMI 30-39.9) 07/31/2017   Aortic atherosclerosis ([place]) 07/31/2017   Iron deficiency anemia 02/16/2017   CAD (coronary artery disease), native coronary artery 01/16/2016   Orthostatic hypotension 11/19/2015   Hypokalemia 10/21/2014   Breast cancer of upper-outer quadrant of right female breast ([place]) 09/09/2014   Lumbar spinal stenosis 07/31/2014   GERD (gastroesophageal reflux disease)    Depression    Hypercholesteremia    Arthritis    Paroxysmal atrial fibrillation ([place]) 08/12/2012   Hypertension    Sliding hiatal hernia s/p lap PEH repair 07/31/2012  Unstable angina ([place]) 01/23/2012    Past Surgical History:  Procedure Laterality Date   BREAST BIOPSY Right    BREAST LUMPECTOMY Right 08/2014   BREAST LUMPECTOMY WITH SENTINEL LYMPH NODE BIOPSY Right 09/22/2014   Procedure: RIGHT BREAST LUMPECTOMY WITH NEEDLE LOCALIZATION AND RIGHT AXILLARY SENTINEL LYMPH NODE BIOPSY;  Surgeon: [name], MD;  Location: [place];  Service: General;  Laterality: Right;   CARDIAC CATHETERIZATION     CARDIAC CATHETERIZATION N/A 11/05/2015   Procedure: Left Heart Cath and Coronary Angiography;  Surgeon: Belva Crome, MD;  Location: [place] [facility];  Service: Cardiovascular;  Laterality: N/A;   CARDIAC CATHETERIZATION N/A 11/05/2015   Procedure: Coronary Stent Intervention;   Surgeon: Belva Crome, MD;  Location: [place] [facility];  Service: Cardiovascular;  Laterality: N/A;   CARPAL TUNNEL RELEASE Right 09/2015   CHOLECYSTECTOMY N/A 03/28/2018   Procedure: LAPAROSCOPIC CHOLECYSTECTOMY WITH INTRAOPERATIVE CHOLANGIOGRAM ERAS PATHWAY;  Surgeon: Jovita Kussmaul, MD;  Location: [place];  Service: General;  Laterality: N/A;   CORONARY ANGIOPLASTY WITH STENT PLACEMENT  11/05/2015   "1 stent"   ESOPHAGEAL MANOMETRY N/A 07/02/2012   Procedure: ESOPHAGEAL MANOMETRY (EM);  Surgeon: Lear Ng, MD;  Location: [facility];  Service: Endoscopy;  Laterality: N/A;  schooler to read   EYE SURGERY Left 2013   tube placed tear duct   HIATAL HERNIA REPAIR N/A 08/10/2012   Procedure: LAPAROSCOPIC REPAIR OF HIATAL HERNIA WITH MESH AND EGD WITH PEG TUBE PLACEMENT;  Surgeon: Ralene Ok, MD;  Location: [facility];  Service: General;  Laterality: N/A;   LEFT HEART CATH AND CORONARY ANGIOGRAPHY N/A 08/01/2017   Procedure: LEFT HEART CATH AND CORONARY ANGIOGRAPHY;  Surgeon: Jettie Booze, MD;  Location: [place] [facility];  Service: Cardiovascular;  Laterality: N/A;   PORT-A-CATH REMOVAL  06/2015   PORTACATH PLACEMENT Left 09/22/2014   Procedure: INSERTION PORT-A-CATH;  Surgeon: [name], MD;  Location: [place];  Service: General;  Laterality: Left;   POSTERIOR LUMBAR FUSION  2016   "had 2 vertebrae fused"   TONSILLECTOMY  as child   TOTAL KNEE ARTHROPLASTY Bilateral 2011,2012     OB History    Gravida  1   Para  1   Term      Preterm      AB      Living  1     SAB      TAB      Ectopic      Multiple      Live Births               Home Medications    Prior to Admission medications   Medication Sig Start Date End Date Taking? Authorizing Provider  acetaminophen (TYLENOL) 500 MG tablet Take 1,000 mg by mouth every 6 (six) hours as needed for moderate pain or headache.    [provider]  aspirin EC 81 MG EC tablet Take 1  tablet (81 mg total) by mouth daily. 11/06/15   Consuelo Pandy, PA-C  Cholecalciferol (VITAMIN D) 2000 UNITS tablet Take 2,000 Units by mouth 2 (two) times daily.     [provider]  clopidogrel (PLAVIX) 75 MG tablet Take 1 tablet (75 mg total) by mouth daily. 09/07/18   Jerline Pain, MD  dexlansoprazole (DEXILANT) 60 MG capsule Take 60 mg by mouth daily.    [provider]  escitalopram (LEXAPRO) 20 MG tablet Take 20 mg by mouth daily. 01/22/16  [provider]  ezetimibe (ZETIA) 10 MG tablet Take 1 tablet (10 mg total) by mouth at bedtime. 09/07/18   Jerline Pain, MD  famotidine (PEPCID) 20 MG tablet Take 20 mg by mouth daily. 08/31/18   [provider]  fexofenadine (ALLEGRA) 180 MG tablet Take 180 mg by mouth daily as needed for allergies.     [provider]  furosemide (LASIX) 20 MG tablet Take one tablet daily as needed for 3 lbs over night weight gain 09/07/18   Jerline Pain, MD  gabapentin (NEURONTIN) 600 MG tablet Take 1 tablet (600 mg total) by mouth 3 (three) times daily. 10/18/18   Hyatt, Max T, DPM  HYDROcodone-acetaminophen (NORCO/VICODIN) 5-325 MG tablet Take 1 tablet by mouth every 4 (four) hours as needed for moderate pain. 11/24/18   Orpah Greek, MD  LORazepam (ATIVAN) 1 MG tablet Take 1 mg by mouth daily. 09/04/18   [provider]  lovastatin (MEVACOR) 40 MG tablet TAKE 1 TABLET BY MOUTH AT  BEDTIME 06/29/18   Jerline Pain, MD  Magnesium 500 MG TABS Take 500 mg by mouth at bedtime.    [provider]  meclizine (ANTIVERT) 25 MG tablet Take 12.5-25 mg by mouth 3 (three) times daily as needed for dizziness.    [provider]  montelukast (SINGULAIR) 10 MG tablet Take 10 mg by mouth at bedtime. 08/23/18   [provider]  nitroGLYCERIN (NITROSTAT) 0.4 MG SL tablet Place 1 tablet (0.4 mg total) under the tongue every 5 (five) minutes as needed for chest pain (CP or SOB). 09/07/18   Jerline Pain, MD  nystatin cream (MYCOSTATIN) Apply 1 application topically 2 (two) times daily. 05/17/18   Fontaine, Belinda Block, MD  ondansetron (ZOFRAN) 4 MG tablet Take 1 tablet (4 mg total) by mouth every 6 (six) hours as needed for nausea or vomiting. 11/24/18   Pollina, Gwenyth Allegra, MD  potassium chloride (K-DUR) 10 MEQ tablet Take 10 mEq by mouth every 3 (three) days.    [provider]  sodium chloride (OCEAN) 0.65 % SOLN nasal spray Place 2 sprays into both nostrils as needed for congestion.    [provider]  tiZANidine (ZANAFLEX) 2 MG tablet Take 2-4 mg by mouth every 8 (eight) hours as needed for muscle spasms. 05/01/18   [provider]  vitamin C (ASCORBIC ACID) 500 MG tablet Take 500 mg by mouth daily.    [provider]    Family History Family History  Problem Relation Age of Onset   Heart disease Mother        78s, CHF   Heart disease Father        d/o MI at 39   Cancer Father        skin   Diabetes Father    Cancer Sister 66       breast   Diabetes Sister    Heart disease Sister        CABG in mid-60s    Social History Social History   Tobacco Use   Smoking status: Never Smoker   Smokeless tobacco: Never Used  Substance Use Topics   Alcohol use: No   Drug use: No     Allergies   Shellfish allergy, Aspirin, Penicillins, Aleve [naproxen], Brilinta [ticagrelor], Ciprofloxacin, Doxycycline, Evista [raloxifene], Fosamax [alendronate sodium], Lactose intolerance (gi), Latex, Naproxen sodium, Nitrofurantoin, Pravachol [pravastatin], Sulfa antibiotics, Tape, Welchol [colesevelam hcl], and Zocor [simvastatin]   Review of Systems Review  of Systems  HENT: Positive for facial swelling.   Eyes: Positive for visual disturbance. Negative for photophobia.  Musculoskeletal: Positive for arthralgias and myalgias.  Neurological: Positive for headaches. Negative for dizziness, syncope, weakness and numbness.  All other systems  reviewed and are negative.    Physical Exam Updated Vital Signs BP 126/63    Pulse 68    Temp 98.1 F (36.7 C) (Oral)    Resp 18    SpO2 93%   Physical Exam Vitals signs and nursing note reviewed.  Constitutional:      General: She is not in acute distress.    Appearance: She is well-developed.  HENT:     Head: Normocephalic.     Comments: Significant swelling and ecchymosis around the right eye.  She does have 2 very superficial lacerations to the right eyelid. Eyes:     Comments: Slight decrease of EOM when looking lateral direction. Significant subconjunctival hemorrhage as well as extensive periorbital swelling.  Neck:     Musculoskeletal: Neck supple.     Comments: C-collar in place.  No midline tenderness. Cardiovascular:     Rate and Rhythm: Normal rate and regular rhythm.     Heart sounds: Normal heart sounds. No murmur.  Pulmonary:     Effort: Pulmonary effort is normal. No respiratory distress.     Breath sounds: Normal breath sounds.  Abdominal:     General: There is no distension.     Palpations: Abdomen is soft.     Tenderness: There is no abdominal tenderness.  Musculoskeletal:     Comments: Right upper extremity in sling.  She does have tenderness to shoulder, elbow and wrist.  No step-off or deformity appreciated.  Intact distal pulses x4.  Sensation intact x4 as well. No tenderness to hips. No T/L spine tenderness.  Skin:    General: Skin is warm and dry.  Neurological:     Mental Status: She is alert and oriented to person, place, and time.      ED Treatments / Results  Labs (all labs ordered are listed, but only abnormal results are displayed) Labs Reviewed  URINE CULTURE  CBC WITH DIFFERENTIAL/PLATELET  BASIC METABOLIC PANEL  URINALYSIS, ROUTINE W REFLEX MICROSCOPIC    EKG None  Radiology Dg Shoulder Right  Result Date: 12/12/2018 CLINICAL DATA:  73 year old female status post fall with pain. Distal right humerus fracture earlier this  month. EXAM: RIGHT SHOULDER - 2+ VIEW COMPARISON:  Right elbow CT 11/24/2018 and earlier. FINDINGS: No glenohumeral joint dislocation. The proximal right humerus appears stable and intact. The visible right clavicle in scapula appear intact. Stable visible right chest and chest wall surgical clips. IMPRESSION: No acute fracture or dislocation identified about the right shoulder. Electronically Signed   By: Genevie Ann M.D.   On: 12/12/2018 17:48   Ct Head Wo Contrast  Result Date: 12/12/2018 CLINICAL DATA:  Trip and fall, anticoagulation EXAM: CT HEAD WITHOUT CONTRAST CT ORBITS WITHOUT CONTRAST CT CERVICAL SPINE WITHOUT CONTRAST TECHNIQUE: Multidetector CT imaging of the head, cervical spine, and orbits were performed using the standard protocol without intravenous contrast. Multiplanar CT image reconstructions of the cervical spine and maxillofacial structures were also generated. COMPARISON:  10/25/2018 FINDINGS: CT HEAD FINDINGS Brain: No evidence of acute infarction, hemorrhage, hydrocephalus, extra-axial collection or mass lesion/mass effect. Vascular: No hyperdense vessel or unexpected calcification. CT ORBITS FINDINGS Skull: Normal. Negative for fracture or focal lesion. Orbits: There is high attenuation subconjunctival hemorrhage of the right globe and exophthalmus (series  4, image 15). There are depressed fractures of the right lamina papyracea with fat and medial rectus herniation (series 14, image 30). The orbital floor is intact. Sinuses: Partial opacification of the right ethmoid air cells. Other: Right frontal scalp and preseptal hematoma. CT CERVICAL SPINE FINDINGS Alignment: Normal. Skull base and vertebrae: No acute fracture. No primary bone lesion or focal pathologic process. Soft tissues and spinal canal: No prevertebral fluid or swelling. No visible canal hematoma. Disc levels: Mild multilevel disc space height loss and osteophytosis. Upper chest: Negative. Other: None. IMPRESSION: 1.  No acute  intracranial pathology. 2. There is high attenuation subconjunctival hemorrhage of the right globe and exophthalmus (series 4, image 15). There are depressed fractures of the right lamina papyracea with fat and medial rectus herniation (series 14, image 30). The orbital floor is intact. 3.  Right frontal scalp and preseptal hematoma. 4.  No fracture or static subluxation of the cervical spine. Electronically Signed   By: Eddie Candle M.D.   On: 12/12/2018 17:07   Ct Cervical Spine Wo Contrast  Result Date: 12/12/2018 CLINICAL DATA:  Trip and fall, anticoagulation EXAM: CT HEAD WITHOUT CONTRAST CT ORBITS WITHOUT CONTRAST CT CERVICAL SPINE WITHOUT CONTRAST TECHNIQUE: Multidetector CT imaging of the head, cervical spine, and orbits were performed using the standard protocol without intravenous contrast. Multiplanar CT image reconstructions of the cervical spine and maxillofacial structures were also generated. COMPARISON:  10/25/2018 FINDINGS: CT HEAD FINDINGS Brain: No evidence of acute infarction, hemorrhage, hydrocephalus, extra-axial collection or mass lesion/mass effect. Vascular: No hyperdense vessel or unexpected calcification. CT ORBITS FINDINGS Skull: Normal. Negative for fracture or focal lesion. Orbits: There is high attenuation subconjunctival hemorrhage of the right globe and exophthalmus (series 4, image 15). There are depressed fractures of the right lamina papyracea with fat and medial rectus herniation (series 14, image 30). The orbital floor is intact. Sinuses: Partial opacification of the right ethmoid air cells. Other: Right frontal scalp and preseptal hematoma. CT CERVICAL SPINE FINDINGS Alignment: Normal. Skull base and vertebrae: No acute fracture. No primary bone lesion or focal pathologic process. Soft tissues and spinal canal: No prevertebral fluid or swelling. No visible canal hematoma. Disc levels: Mild multilevel disc space height loss and osteophytosis. Upper chest: Negative. Other:  None. IMPRESSION: 1.  No acute intracranial pathology. 2. There is high attenuation subconjunctival hemorrhage of the right globe and exophthalmus (series 4, image 15). There are depressed fractures of the right lamina papyracea with fat and medial rectus herniation (series 14, image 30). The orbital floor is intact. 3.  Right frontal scalp and preseptal hematoma. 4.  No fracture or static subluxation of the cervical spine. Electronically Signed   By: Eddie Candle M.D.   On: 12/12/2018 17:07   Ct Orbits Wo Contrast  Result Date: 12/12/2018 CLINICAL DATA:  Trip and fall, anticoagulation EXAM: CT HEAD WITHOUT CONTRAST CT ORBITS WITHOUT CONTRAST CT CERVICAL SPINE WITHOUT CONTRAST TECHNIQUE: Multidetector CT imaging of the head, cervical spine, and orbits were performed using the standard protocol without intravenous contrast. Multiplanar CT image reconstructions of the cervical spine and maxillofacial structures were also generated. COMPARISON:  10/25/2018 FINDINGS: CT HEAD FINDINGS Brain: No evidence of acute infarction, hemorrhage, hydrocephalus, extra-axial collection or mass lesion/mass effect. Vascular: No hyperdense vessel or unexpected calcification. CT ORBITS FINDINGS Skull: Normal. Negative for fracture or focal lesion. Orbits: There is high attenuation subconjunctival hemorrhage of the right globe and exophthalmus (series 4, image 15). There are depressed fractures of the right lamina papyracea with  fat and medial rectus herniation (series 14, image 30). The orbital floor is intact. Sinuses: Partial opacification of the right ethmoid air cells. Other: Right frontal scalp and preseptal hematoma. CT CERVICAL SPINE FINDINGS Alignment: Normal. Skull base and vertebrae: No acute fracture. No primary bone lesion or focal pathologic process. Soft tissues and spinal canal: No prevertebral fluid or swelling. No visible canal hematoma. Disc levels: Mild multilevel disc space height loss and osteophytosis. Upper  chest: Negative. Other: None. IMPRESSION: 1.  No acute intracranial pathology. 2. There is high attenuation subconjunctival hemorrhage of the right globe and exophthalmus (series 4, image 15). There are depressed fractures of the right lamina papyracea with fat and medial rectus herniation (series 14, image 30). The orbital floor is intact. 3.  Right frontal scalp and preseptal hematoma. 4.  No fracture or static subluxation of the cervical spine. Electronically Signed   By: Eddie Candle M.D.   On: 12/12/2018 17:07    Procedures Procedures (including critical care time)  Medications Ordered in ED Medications - No data to display   Initial Impression / Assessment and Plan / ED Course  I have reviewed the triage vital signs and the nursing notes.  Pertinent labs & imaging results that were available during my care of the patient were reviewed by me and considered in my medical decision making (see chart for details).       MIGUEL MEDAL is a 73 y.o. female who presents to ED for evaluation after fall just prior to arrival. Patient unable to contribute much to history.  Hard to discern whether this was syncope versus mechanical fall.  Unfortunately, she does have history of recurrent falls.  She was seen in the emergency department of 7/10 after fall.  Diagnosed with UTI as well as distal humerus fracture which she is scheduled to have repaired on Friday.  Overall, I am very concerned about her recurrent falls.  She lives at home with her husband who she reports has mild dementia and is his caregiver.  Today, she has extensive swelling to the periorbital region and CT scan concerning for depressed fractures of the right lamina papyracea with fat medial rectus herniation.  She does have a little bit of difficulty with lateral eye deviation.  Ophthalmology, Dr. Posey Pronto, was consulted who has evaluated patient in the emergency department.  Recommends ENT consultation in the morning. UA concerning for  UTI. Will admit for UTI, syncope vs. Fall and ENT in am.   Patient seen by and discussed with Dr. Kathrynn Humble who agrees with treatment plan.   Final Clinical Impressions(s) / ED Diagnoses   Final diagnoses:  None    ED Discharge Orders    None       Wendy Mikles, Ozella Almond, PA-C 12/12/18 2214    Varney Biles, MD 12/13/18 1426

## 2018-12-12 NOTE — ED Notes (Signed)
Patient transported to CT 

## 2018-12-13 ENCOUNTER — Observation Stay (HOSPITAL_COMMUNITY): Payer: Medicare Other

## 2018-12-13 ENCOUNTER — Observation Stay (HOSPITAL_BASED_OUTPATIENT_CLINIC_OR_DEPARTMENT_OTHER): Payer: Medicare Other

## 2018-12-13 DIAGNOSIS — R55 Syncope and collapse: Secondary | ICD-10-CM | POA: Diagnosis not present

## 2018-12-13 DIAGNOSIS — E7800 Pure hypercholesterolemia, unspecified: Secondary | ICD-10-CM | POA: Diagnosis not present

## 2018-12-13 DIAGNOSIS — I10 Essential (primary) hypertension: Secondary | ICD-10-CM | POA: Diagnosis not present

## 2018-12-13 DIAGNOSIS — S0292XA Unspecified fracture of facial bones, initial encounter for closed fracture: Secondary | ICD-10-CM | POA: Diagnosis not present

## 2018-12-13 LAB — CBC
HCT: 30.5 % — ABNORMAL LOW (ref 36.0–46.0)
Hemoglobin: 9.7 g/dL — ABNORMAL LOW (ref 12.0–15.0)
MCH: 30 pg (ref 26.0–34.0)
MCHC: 31.8 g/dL (ref 30.0–36.0)
MCV: 94.4 fL (ref 80.0–100.0)
Platelets: 255 10*3/uL (ref 150–400)
RBC: 3.23 MIL/uL — ABNORMAL LOW (ref 3.87–5.11)
RDW: 13.2 % (ref 11.5–15.5)
WBC: 7.2 10*3/uL (ref 4.0–10.5)
nRBC: 0 % (ref 0.0–0.2)

## 2018-12-13 LAB — COMPREHENSIVE METABOLIC PANEL
ALT: 10 U/L (ref 0–44)
AST: 17 U/L (ref 15–41)
Albumin: 2.8 g/dL — ABNORMAL LOW (ref 3.5–5.0)
Alkaline Phosphatase: 82 U/L (ref 38–126)
Anion gap: 10 (ref 5–15)
BUN: 11 mg/dL (ref 8–23)
CO2: 27 mmol/L (ref 22–32)
Calcium: 8.4 mg/dL — ABNORMAL LOW (ref 8.9–10.3)
Chloride: 101 mmol/L (ref 98–111)
Creatinine, Ser: 0.71 mg/dL (ref 0.44–1.00)
GFR calc Af Amer: >60 mL/min (ref >60)
GFR calc non Af Amer: >60 mL/min (ref >60)
Glucose, Bld: 97 mg/dL (ref 70–99)
Potassium: 3.5 mmol/L (ref 3.5–5.1)
Sodium: 138 mmol/L (ref 135–145)
Total Bilirubin: 0.9 mg/dL (ref 0.3–1.2)
Total Protein: 6.4 g/dL — ABNORMAL LOW (ref 6.5–8.1)

## 2018-12-13 LAB — MAGNESIUM: Magnesium: 1.9 mg/dL (ref 1.7–2.4)

## 2018-12-13 LAB — PHOSPHORUS: Phosphorus: 4.4 mg/dL (ref 2.5–4.6)

## 2018-12-13 LAB — SARS CORONAVIRUS 2 BY RT PCR (HOSPITAL ORDER, PERFORMED IN ~~LOC~~ HOSPITAL LAB): SARS Coronavirus 2: NEGATIVE

## 2018-12-13 LAB — TSH: TSH: 0.932 u[IU]/mL (ref 0.350–4.500)

## 2018-12-13 LAB — TROPONIN I (HIGH SENSITIVITY)
Troponin I (High Sensitivity): <2 ng/L (ref <18)
Troponin I (High Sensitivity): 3 ng/L (ref <18)

## 2018-12-13 LAB — PREALBUMIN: Prealbumin: 13.5 mg/dL — ABNORMAL LOW (ref 18–38)

## 2018-12-13 MED ORDER — ESCITALOPRAM OXALATE 10 MG PO TABS
20.0000 mg | ORAL_TABLET | Freq: Every day | ORAL | Status: DC
  Administered 2018-12-13: 20 mg via ORAL
  Filled 2018-12-13: qty 2

## 2018-12-13 MED ORDER — ACETAMINOPHEN 325 MG PO TABS: 650.0000 mg | ORAL_TABLET | Freq: Four times a day (QID) | ORAL | Status: DC | PRN

## 2018-12-13 MED ORDER — FAMOTIDINE 20 MG PO TABS
20.0000 mg | ORAL_TABLET | Freq: Every day | ORAL | Status: DC
  Administered 2018-12-13: 20 mg via ORAL
  Filled 2018-12-13: qty 1

## 2018-12-13 MED ORDER — TRAMADOL HCL 50 MG PO TABS
50.0000 mg | ORAL_TABLET | Freq: Four times a day (QID) | ORAL | Status: DC | PRN
  Administered 2018-12-13: 50 mg via ORAL
  Filled 2018-12-13: qty 1

## 2018-12-13 MED ORDER — SODIUM CHLORIDE 0.9 % IV SOLN
INTRAVENOUS | Status: AC
  Administered 2018-12-13: 04:00:00 via INTRAVENOUS

## 2018-12-13 MED ORDER — EZETIMIBE 10 MG PO TABS
10.0000 mg | ORAL_TABLET | Freq: Every day | ORAL | Status: DC
  Administered 2018-12-13: 10 mg via ORAL
  Filled 2018-12-13: qty 1

## 2018-12-13 MED ORDER — ACETAMINOPHEN 650 MG RE SUPP: 650.0000 mg | Freq: Four times a day (QID) | RECTAL | Status: DC | PRN

## 2018-12-13 MED ORDER — BOOST / RESOURCE BREEZE PO LIQD CUSTOM: 1.0000 | Freq: Three times a day (TID) | ORAL | Status: DC

## 2018-12-13 MED ORDER — MONTELUKAST SODIUM 10 MG PO TABS
10.0000 mg | ORAL_TABLET | Freq: Every day | ORAL | Status: DC
  Administered 2018-12-13: 10 mg via ORAL
  Filled 2018-12-13: qty 1

## 2018-12-13 MED ORDER — ONDANSETRON HCL 4 MG PO TABS: 4.0000 mg | ORAL_TABLET | Freq: Four times a day (QID) | ORAL | Status: DC | PRN

## 2018-12-13 MED ORDER — SODIUM CHLORIDE 0.9 % IV SOLN
1.0000 g | INTRAVENOUS | Status: DC
  Filled 2018-12-13: qty 10

## 2018-12-13 MED ORDER — LORAZEPAM 0.5 MG PO TABS
0.5000 mg | ORAL_TABLET | Freq: Every day | ORAL | Status: DC
  Administered 2018-12-13: 0.5 mg via ORAL
  Filled 2018-12-13: qty 1

## 2018-12-13 MED ORDER — PANTOPRAZOLE SODIUM 40 MG PO TBEC
40.0000 mg | DELAYED_RELEASE_TABLET | Freq: Every day | ORAL | Status: DC
  Administered 2018-12-13: 40 mg via ORAL
  Filled 2018-12-13: qty 1

## 2018-12-13 MED ORDER — SODIUM CHLORIDE 0.9 % IV BOLUS
500.0000 mL | Freq: Once | INTRAVENOUS | Status: AC
  Administered 2018-12-13: 500 mL via INTRAVENOUS

## 2018-12-13 MED ORDER — GABAPENTIN 300 MG PO CAPS
600.0000 mg | ORAL_CAPSULE | Freq: Three times a day (TID) | ORAL | Status: DC
  Administered 2018-12-13 (×2): 600 mg via ORAL
  Filled 2018-12-13 (×2): qty 2

## 2018-12-13 MED ORDER — ONDANSETRON HCL 4 MG/2ML IJ SOLN: 4.0000 mg | Freq: Four times a day (QID) | INTRAMUSCULAR | Status: DC | PRN

## 2018-12-13 NOTE — Procedures (Signed)
Echo attempted. Patient care in progress. Will attempt again. 

## 2018-12-13 NOTE — Progress Notes (Signed)
Initial Nutrition Assessment  DOCUMENTATION CODES:   Obesity unspecified  INTERVENTION:   -Provide Boost Breeze po TID, each supplement provides 250 kcal and 9 grams of protein  NUTRITION DIAGNOSIS:   Unintentional weight loss related to social / environmental circumstances as evidenced by (chart review).  GOAL:   Patient will meet greater than or equal to 90% of their needs  MONITOR:   PO intake, Supplement acceptance, Labs, Weight trends, I & O's  REASON FOR ASSESSMENT:   Consult Assessment of nutrition requirement/status  ASSESSMENT:   73 y.o. female with medical history significant on CAD s/p non-STEMI with drug-eluting stent to her LAD in 2017 (99% bifurcation lesion in the LAD that was stented and had a jailed diagonal that was dilated), orthostatic hypotension, right breast CA, GERD, HTN and HLD history of breast cancer, depression, GERD, iron  deficiency anemia. Admitted for syncope vs fall with possible UTi  **RD working remotely**  Patient consumed 100% of breakfast this morning (providing ~330 kcal and 13g protein). Will order Boost Breeze supplements for additional kcal and protein. Pt has a good appetite.  Per weight records, pt has lost 20 lbs since January 2020 (11% wt loss x 7 months, borderline significant for time frame).   Labs reviewed. Medications reviewed.  NUTRITION - FOCUSED PHYSICAL EXAM:  Unable to perform -working remotely.  Diet Order:   Diet Order            Diet Heart Room service appropriate? Yes; Fluid consistency: Thin  Diet effective now              EDUCATION NEEDS:   No education needs have been identified at this time  Skin:  Skin Assessment: Reviewed RN Assessment  Last BM:  PTA  Height:   Ht Readings from Last 1 Encounters:  12/13/18 4\' 8"  (1.422 m)    Weight:   Wt Readings from Last 1 Encounters:  12/13/18 72.9 kg    Ideal Body Weight:  42.5 kg  BMI:  Body mass index is 36.03 kg/m.  Estimated  Nutritional Needs:   Kcal:  1300-1500  Protein:  65-75g  Fluid:  1.5L/day  Clayton Bibles, MS, RD, LDN [place] Dietitian Pager: [phone] After Hours Pager: [phone]

## 2018-12-13 NOTE — Discharge Summary (Signed)
Physician Discharge Summary  Angela Bond CHE:[id number] DOB: [birth year] DOA: 12/12/2018  PCP: Angela Arabian, MD  Admit date: 12/12/2018 Discharge date: 12/13/2018  Admitted From: Home Disposition: Patient left hospital AMA  Discharge Condition: Guarded CODE STATUS: Full Diet recommendation: As tolerated  Brief/Interim Summary: Angela Bond is a 73 y.o. female with medical history significant onCAD s/pnon-STEMI with drug-eluting stent to her LAD in 2017(99% bifurcation lesion in the LAD that was stented and had a jailed diagonal that was dilated), orthostatic hypotension, right breast CA, GERD, HTN and HLD history of breast cancer, depression, GERD, iron  deficiency anemia. Presented with   Syncope while in cardiology office. She had a recent fall on 10 July onto her right extremity was seen in emergency department showed nondisplaced fracture of the distal humerus she was being evaluated for repair by orthopedics. She was in the cardiology office for clearance prior to her repair she was cleared for surgery from cardiology standpoint but was sent out to the lobby to get her labs checked. While in the lobby patient had a syncope event as per cardiology note Vital signs stable and she was transferred to [facility] emergency department for further evaluation. Review of records it is still not clear if it was a syncope or fall he was witnessed by another patient per his report patient did not trip. She hit her head on a wall and then again on the floor developed significant bleeding from her right eye. Of note patient is positive for bedbugs. She recently was diagnosed with UTI and given Keflex has been having occasional pressure and now lower abdomen. She has had recurrent UTIs. Reports significant weight loss  Patient admitted as above with syncopal event at cardiologist office, patient continues to refuse that she had a true syncopal event and had just tripped and fell.  Patient continues to  be markedly concerned about her husband who is at home alone with moderate to advanced dementia.  Patient was educated at length about need for further evaluation and work-up, patient has not yet been evaluated by ENT given her subconjunctival hemorrhage as well as patient had further work-up for syncopal event including echocardiogram and imaging.  Unfortunately patient refused any further treatment and left the hospital early this afternoon AMA, patient understood the risks of not continuing treatment or evaluation included loss of eyesight, death and permanent disability.  Despite our lengthy conversation patient refused to have any further evaluation and subsequently left the hospital AMA.  Discharge Diagnoses:  Active Problems:   Paroxysmal atrial fibrillation (HCC)   Hypertension   GERD (gastroesophageal reflux disease)   Hypercholesteremia   Breast cancer of upper-outer quadrant of right female breast (HCC)   Orthostatic hypotension   CAD (coronary artery disease), native coronary artery   Iron deficiency anemia   Asymptomatic bacteriuria   Syncope   Facial fracture due to fall, closed, initial encounter ([place])   Bedbug bite    Allergies  Allergen Reactions  . Shellfish Allergy Anaphylaxis and Other (See Comments)    ANAPHYLAXIS TO OYSTERS  . Aspirin Other (See Comments)    Burns stomach  . Penicillins Nausea And Vomiting and Other (See Comments)    PATIENT HAS HAD A PCN REACTION WITH IMMEDIATE RASH, FACIAL/TONGUE/THROAT SWELLING, SOB, OR LIGHTHEADEDNESS WITH HYPOTENSION:  #  #  YES  #  #  Has patient had a PCN reaction causing severe rash involving mucus membranes or skin necrosis: No Has patient had a PCN reaction that required hospitalization:  No Has patient had a PCN reaction occurring within the last 10 years: No If all of the above answers are "NO", then may proceed with Cephalosporin use.   [name] [Naproxen] Nausea And Vomiting  . Brilinta [Ticagrelor] Other (See  Comments)    Indigestion  . Ciprofloxacin Nausea And Vomiting  . Doxycycline Nausea And Vomiting  . Evista [Raloxifene] Other (See Comments)    LEG ACHES  . Fosamax [Alendronate Sodium] Other (See Comments)    Leg aches  . Lactose Intolerance (Gi) Nausea And Vomiting  . Latex Rash  . Naproxen Sodium Nausea And Vomiting  . Nitrofurantoin Rash and Other (See Comments)    "I broke out down there"  . Pravachol [Pravastatin] Other (See Comments)    GI upset   . Sulfa Antibiotics Nausea And Vomiting  . Tape Rash  . Welchol [Colesevelam Hcl] Other (See Comments)    Stomach cramps  . Zocor [Simvastatin] Rash    Procedures/Studies: Dg Shoulder Right  Result Date: 12/12/2018 CLINICAL DATA:  73 year old female status post fall with pain. Distal right humerus fracture earlier this month. EXAM: RIGHT SHOULDER - 2+ VIEW COMPARISON:  Right elbow CT 11/24/2018 and earlier. FINDINGS: No glenohumeral joint dislocation. The proximal right humerus appears stable and intact. The visible right clavicle in scapula appear intact. Stable visible right chest and chest wall surgical clips. IMPRESSION: No acute fracture or dislocation identified about the right shoulder. Electronically Signed   By: Genevie Ann M.D.   On: 12/12/2018 17:48   Dg Elbow Complete Right  Result Date: 12/12/2018 CLINICAL DATA:  73 year old female status post fall with pain. Distal humerus fracture at the elbow earlier this month. EXAM: RIGHT ELBOW - COMPLETE 3+ VIEW COMPARISON:  Elbow CT 11/24/2018, and earlier. FINDINGS: Stable radiographic appearance of the elbow since 11/23/2018, with fractures of the distal humerus with anteriorly rotated fragments of the capitellum and trochlea as demonstrated by CT. The proximal radius and ulna appear remain intact. No new No acute osseous abnormality identified. IMPRESSION: Stable distal humerus fracture as depicted by CT on 11/24/2018. Electronically Signed   By: Genevie Ann M.D.   On: 12/12/2018 17:55    Dg Elbow Complete Right  Result Date: 11/24/2018 CLINICAL DATA:  Fall with elbow pain EXAM: RIGHT ELBOW - COMPLETE 3+ VIEW COMPARISON:  11/23/2018 FINDINGS: Limited by positioning. Curvilinear osseous fragment projecting over the antecubital fossa, presumably humeral condyle fracture fragment. On 1 of the 2 lateral views, the radial head appears slightly subluxed anteriorly. IMPRESSION: Abnormal elbow radiographs. Suspect that there may be a displaced humeral condyle fracture fragment. Possible anterior subluxation of radial head on 1 of the two views. CT is recommended for further evaluation. Electronically Signed   By: Donavan Foil M.D.   On: 11/24/2018 00:34   Dg Forearm Right  Result Date: 11/23/2018 CLINICAL DATA:  Arm pain status post fall. EXAM: RIGHT FOREARM - 2 VIEW COMPARISON:  None. FINDINGS: There is soft tissue swelling about the forearm. There is no definite acute displaced fracture or dislocation. There is a somewhat abnormal appearance of the right elbow which may be projectional. There are degenerative changes of the wrist and first digit. IMPRESSION: 1. Soft tissue swelling about the forearm. 2. No acute displaced fracture or dislocation. 3. Abnormal appearance of the elbow which may be projectional. Follow-up with dedicated elbow radiographs is recommended. Electronically Signed   By: Constance Holster M.D.   On: 11/23/2018 20:34   Dg Wrist Complete Right  Result Date: 12/12/2018  CLINICAL DATA:  73 year old female status post fall with pain. Distal right humerus fracture earlier this month. EXAM: RIGHT WRIST - COMPLETE 3+ VIEW COMPARISON:  Right hand series 05/13/2013. FINDINGS: Chronic appearing fragmentation at the distal pole of the scaphoid, which seems to be a remnant of the trapezium. The trapezoid appears sclerotic and diminutive. There is also cortical irregularity along the radial aspect of the lunate. The distal radius and ulna appear intact.  Thumb IP osteoarthritis.  IMPRESSION: 1. 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 2. Difficult to exclude acute fracture of the lunate, although cortical irregularity along its radial margin might also be chronic. 3. Distal radius and ulna intact. Electronically Signed   By: Genevie Ann M.D.   On: 12/12/2018 17:53   Ct Head Wo Contrast  Result Date: 12/12/2018 CLINICAL DATA:  Trip and fall, anticoagulation EXAM: CT HEAD WITHOUT CONTRAST CT ORBITS WITHOUT CONTRAST CT CERVICAL SPINE WITHOUT CONTRAST TECHNIQUE: Multidetector CT imaging of the head, cervical spine, and orbits were performed using the standard protocol without intravenous contrast. Multiplanar CT image reconstructions of the cervical spine and maxillofacial structures were also generated. COMPARISON:  10/25/2018 FINDINGS: CT HEAD FINDINGS Brain: No evidence of acute infarction, hemorrhage, hydrocephalus, extra-axial collection or mass lesion/mass effect. Vascular: No hyperdense vessel or unexpected calcification. CT ORBITS FINDINGS Skull: Normal. Negative for fracture or focal lesion. Orbits: There is high attenuation subconjunctival hemorrhage of the right globe and exophthalmus (series 4, image 15). There are depressed fractures of the right lamina papyracea with fat and medial rectus herniation (series 14, image 30). The orbital floor is intact. Sinuses: Partial opacification of the right ethmoid air cells. Other: Right frontal scalp and preseptal hematoma. CT CERVICAL SPINE FINDINGS Alignment: Normal. Skull base and vertebrae: No acute fracture. No primary bone lesion or focal pathologic process. Soft tissues and spinal canal: No prevertebral fluid or swelling. No visible canal hematoma. Disc levels: Mild multilevel disc space height loss and osteophytosis. Upper chest: Negative. Other: None. IMPRESSION: 1.  No acute intracranial pathology. 2. There is high attenuation subconjunctival hemorrhage of the right globe  and exophthalmus (series 4, image 15). There are depressed fractures of the right lamina papyracea with fat and medial rectus herniation (series 14, image 30). The orbital floor is intact. 3.  Right frontal scalp and preseptal hematoma. 4.  No fracture or static subluxation of the cervical spine. Electronically Signed   By: Eddie Candle M.D.   On: 12/12/2018 17:07   Ct Head Wo Contrast  Result Date: 11/24/2018 CLINICAL DATA:  Fall takes Plavix EXAM: CT HEAD WITHOUT CONTRAST CT CERVICAL SPINE WITHOUT CONTRAST TECHNIQUE: Multidetector CT imaging of the head and cervical spine was performed following the standard protocol without intravenous contrast. Multiplanar CT image reconstructions of the cervical spine were also generated. COMPARISON:  CT 08/03/2017 FINDINGS: CT HEAD FINDINGS Brain: No acute territorial infarction, hemorrhage, or intracranial mass. Mild atrophy. Chronic lacunar infarct in the right basal ganglia. Stable ventricle size Vascular: No hyperdense vessels.  Carotid vascular calcification Skull: Normal. Negative for fracture or focal lesion. Sinuses/Orbits: No acute finding. Mucous retention cyst right maxillary sinus Other: None CT CERVICAL SPINE FINDINGS Alignment: Trace anterolisthesis C4 on C5. Facet alignment within normal limits Skull base and vertebrae: No acute fracture. No primary bone lesion or focal pathologic process. Small focal sclerosis right lateral mass of C1, possible bone island Soft tissues and spinal canal: No prevertebral fluid or swelling. No visible canal hematoma.  Disc levels: Mild degenerative changes at C4-C5, C5-C6 and C6-C7. Multiple level left greater than right hypertrophic facet degenerative change. Upper chest: Pleural and parenchymal scarring at the right apex. Other: None IMPRESSION: 1. No CT evidence for acute intracranial abnormality.  Mild atrophy 2. Trace anterolisthesis C4 on C5, likely degenerative. No acute osseous abnormality of the cervical spine.  Electronically Signed   By: Donavan Foil M.D.   On: 11/24/2018 00:43   Ct Cervical Spine Wo Contrast  Result Date: 12/12/2018 CLINICAL DATA:  Trip and fall, anticoagulation EXAM: CT HEAD WITHOUT CONTRAST CT ORBITS WITHOUT CONTRAST CT CERVICAL SPINE WITHOUT CONTRAST TECHNIQUE: Multidetector CT imaging of the head, cervical spine, and orbits were performed using the standard protocol without intravenous contrast. Multiplanar CT image reconstructions of the cervical spine and maxillofacial structures were also generated. COMPARISON:  10/25/2018 FINDINGS: CT HEAD FINDINGS Brain: No evidence of acute infarction, hemorrhage, hydrocephalus, extra-axial collection or mass lesion/mass effect. Vascular: No hyperdense vessel or unexpected calcification. CT ORBITS FINDINGS Skull: Normal. Negative for fracture or focal lesion. Orbits: There is high attenuation subconjunctival hemorrhage of the right globe and exophthalmus (series 4, image 15). There are depressed fractures of the right lamina papyracea with fat and medial rectus herniation (series 14, image 30). The orbital floor is intact. Sinuses: Partial opacification of the right ethmoid air cells. Other: Right frontal scalp and preseptal hematoma. CT CERVICAL SPINE FINDINGS Alignment: Normal. Skull base and vertebrae: No acute fracture. No primary bone lesion or focal pathologic process. Soft tissues and spinal canal: No prevertebral fluid or swelling. No visible canal hematoma. Disc levels: Mild multilevel disc space height loss and osteophytosis. Upper chest: Negative. Other: None. IMPRESSION: 1.  No acute intracranial pathology. 2. There is high attenuation subconjunctival hemorrhage of the right globe and exophthalmus (series 4, image 15). There are depressed fractures of the right lamina papyracea with fat and medial rectus herniation (series 14, image 30). The orbital floor is intact. 3.  Right frontal scalp and preseptal hematoma. 4.  No fracture or static  subluxation of the cervical spine. Electronically Signed   By: Eddie Candle M.D.   On: 12/12/2018 17:07   Ct Cervical Spine Wo Contrast  Result Date: 11/24/2018 CLINICAL DATA:  Fall takes Plavix EXAM: CT HEAD WITHOUT CONTRAST CT CERVICAL SPINE WITHOUT CONTRAST TECHNIQUE: Multidetector CT imaging of the head and cervical spine was performed following the standard protocol without intravenous contrast. Multiplanar CT image reconstructions of the cervical spine were also generated. COMPARISON:  CT 08/03/2017 FINDINGS: CT HEAD FINDINGS Brain: No acute territorial infarction, hemorrhage, or intracranial mass. Mild atrophy. Chronic lacunar infarct in the right basal ganglia. Stable ventricle size Vascular: No hyperdense vessels.  Carotid vascular calcification Skull: Normal. Negative for fracture or focal lesion. Sinuses/Orbits: No acute finding. Mucous retention cyst right maxillary sinus Other: None CT CERVICAL SPINE FINDINGS Alignment: Trace anterolisthesis C4 on C5. Facet alignment within normal limits Skull base and vertebrae: No acute fracture. No primary bone lesion or focal pathologic process. Small focal sclerosis right lateral mass of C1, possible bone island Soft tissues and spinal canal: No prevertebral fluid or swelling. No visible canal hematoma. Disc levels: Mild degenerative changes at C4-C5, C5-C6 and C6-C7. Multiple level left greater than right hypertrophic facet degenerative change. Upper chest: Pleural and parenchymal scarring at the right apex. Other: None IMPRESSION: 1. No CT evidence for acute intracranial abnormality.  Mild atrophy 2. Trace anterolisthesis C4 on C5, likely degenerative. No acute osseous abnormality of the cervical spine. Electronically  Signed   By: Donavan Foil M.D.   On: 11/24/2018 00:43   Ct Elbow Right Wo Contrast  Result Date: 11/24/2018 CLINICAL DATA:  Patient status post fall 11/23/2018 resulting in a right elbow fracture. Initial encounter. EXAM: CT OF THE LOWER  RIGHT EXTREMITY WITHOUT CONTRAST TECHNIQUE: Multidetector CT imaging of the right lower extremity was performed according to the standard protocol. COMPARISON:  Plain films right elbow 11/23/2018. FINDINGS: Bones/Joint/Cartilage The patient has a fracture of the distal humerus. The fracture is distal to the condyles of the humerus and transverse in orientation. Two fracture fragments are identified comprising the capitellum and trochlea. Both fragments are anteriorly rotated out of their articulations with the radius and ulna. No other fracture is identified. No dislocation. Ligaments Suboptimally assessed by CT. Muscles and Tendons Appear intact. Soft tissues Soft tissue swelling is present about the elbow. IMPRESSION: Transverse fracture of the distal humerus distal to the condyles consistent with a B3 type injury. As noted above, fracture fragments of the capitellum and trochlea are separate and anteriorly rotated off their articulations radial head and ulna. Electronically Signed   By: Inge Rise M.D.   On: 11/24/2018 10:00   Dg Humerus Right  Result Date: 11/23/2018 CLINICAL DATA:  Pain status post fall EXAM: RIGHT HUMERUS - 2+ VIEW COMPARISON:  None. FINDINGS: There is no definite acute displaced fracture or dislocation. There is an abnormal appearance of the right elbow which may be projectional. No definite evidence for a glenohumeral dislocation on the right. IMPRESSION: Abnormal appearance of the right elbow which may be projectional. Dedicated right elbow radiographs is recommended. Electronically Signed   By: Constance Holster M.D.   On: 11/23/2018 20:35   Vas US Carotid  Result Date: 12/13/2018 Carotid Arterial Duplex Study Indications:       Syncope. Risk Factors:      Hypertension, hyperlipidemia, coronary artery disease. Comparison Study:  previous study done 10/31/2001 Performing Technologist: Abram Sander RVS  Examination Guidelines: A complete evaluation includes B-mode imaging,  spectral Doppler, color Doppler, and power Doppler as needed of all accessible portions of each vessel. Bilateral testing is considered an integral part of a complete examination. Limited examinations for reoccurring indications may be performed as noted.  Right Carotid Findings: +----------+--------+--------+--------+-----------+--------+           PSV cm/sEDV cm/sStenosisDescribe   Comments +----------+--------+--------+--------+-----------+--------+ CCA Prox  76      16              homogeneous         +----------+--------+--------+--------+-----------+--------+ CCA Distal107     29              homogeneous         +----------+--------+--------+--------+-----------+--------+ ICA Prox  88      27      1-39%   homogeneous         +----------+--------+--------+--------+-----------+--------+ ICA Distal86      26                                  +----------+--------+--------+--------+-----------+--------+ ECA       94      14                                  +----------+--------+--------+--------+-----------+--------+ +----------+--------+-------+--------+-------------------+           PSV cm/sEDV cmsDescribeArm  Pressure (mmHG) +----------+--------+-------+--------+-------------------+ Subclavian115                                        +----------+--------+-------+--------+-------------------+ +---------+--------+--------+------------+ VertebralPSV cm/sEDV cm/sNot assessed +---------+--------+--------+------------+  Left Carotid Findings: +----------+--------+--------+--------+-----------+--------+           PSV cm/sEDV cm/sStenosisDescribe   Comments +----------+--------+--------+--------+-----------+--------+ CCA Prox  110     21              homogeneous         +----------+--------+--------+--------+-----------+--------+ CCA Distal92      22              homogeneous         +----------+--------+--------+--------+-----------+--------+  ICA Prox  100     31      1-39%   homogeneous         +----------+--------+--------+--------+-----------+--------+ ICA Distal100     34                                  +----------+--------+--------+--------+-----------+--------+ ECA       77      10                                  +----------+--------+--------+--------+-----------+--------+ +----------+--------+--------+--------+-------------------+ SubclavianPSV cm/sEDV cm/sDescribeArm Pressure (mmHG) +----------+--------+--------+--------+-------------------+           152                                         +----------+--------+--------+--------+-------------------+ +---------+--------+--+--------+--+---------+ VertebralPSV cm/s60EDV cm/s14Antegrade +---------+--------+--+--------+--+---------+  Summary: Right Carotid: Velocities in the right ICA are consistent with a 1-39% stenosis. Left Carotid: Velocities in the left ICA are consistent with a 1-39% stenosis. Vertebrals: Left vertebral artery demonstrates antegrade flow. Right vertebral             artery was not visualized. *See table(s) above for measurements and observations.  Electronically signed by Monica Martinez MD on 12/13/2018 at 12:30:09 PM.    Final    Ct Orbits Wo Contrast  Result Date: 12/12/2018 CLINICAL DATA:  Trip and fall, anticoagulation EXAM: CT HEAD WITHOUT CONTRAST CT ORBITS WITHOUT CONTRAST CT CERVICAL SPINE WITHOUT CONTRAST TECHNIQUE: Multidetector CT imaging of the head, cervical spine, and orbits were performed using the standard protocol without intravenous contrast. Multiplanar CT image reconstructions of the cervical spine and maxillofacial structures were also generated. COMPARISON:  10/25/2018 FINDINGS: CT HEAD FINDINGS Brain: No evidence of acute infarction, hemorrhage, hydrocephalus, extra-axial collection or mass lesion/mass effect. Vascular: No hyperdense vessel or unexpected calcification. CT ORBITS FINDINGS Skull: Normal.  Negative for fracture or focal lesion. Orbits: There is high attenuation subconjunctival hemorrhage of the right globe and exophthalmus (series 4, image 15). There are depressed fractures of the right lamina papyracea with fat and medial rectus herniation (series 14, image 30). The orbital floor is intact. Sinuses: Partial opacification of the right ethmoid air cells. Other: Right frontal scalp and preseptal hematoma. CT CERVICAL SPINE FINDINGS Alignment: Normal. Skull base and vertebrae: No acute fracture. No primary bone lesion or focal pathologic process. Soft tissues and spinal canal: No prevertebral fluid or swelling. No visible canal hematoma. Disc levels: Mild multilevel disc space height loss and osteophytosis.  Upper chest: Negative. Other: None. IMPRESSION: 1.  No acute intracranial pathology. 2. There is high attenuation subconjunctival hemorrhage of the right globe and exophthalmus (series 4, image 15). There are depressed fractures of the right lamina papyracea with fat and medial rectus herniation (series 14, image 30). The orbital floor is intact. 3.  Right frontal scalp and preseptal hematoma. 4.  No fracture or static subluxation of the cervical spine. Electronically Signed   By: Eddie Candle M.D.   On: 12/12/2018 17:07     Subjective: No acute issues or events overnight, patient continues to have her right eye covered with poor visual acuity, otherwise continues to be somewhat irritated and agitated requesting discharge despite lengthy conversation as above.  Discharge Exam: Vitals:   12/13/18 0527 12/13/18 0900  BP: (!) 111/52 (!) 106/55  Pulse: 64 72  Resp: 20 20  Temp: 98.6 F (37 C)   SpO2: 98% 98%   Vitals:   12/13/18 0100 12/13/18 0334 12/13/18 0527 12/13/18 0900  BP: (!) 92/55 (!) 104/45 (!) 111/52 (!) 106/55  Pulse: 64 65 64 72  Resp: 12 18 20 20   Temp:   98.6 F (37 C)   TempSrc:   Oral Oral  SpO2: 93% 98% 98% 98%  Weight:   72.9 kg   Height:   4\' 8"  (1.422 m)      General:  Pleasantly resting in bed, No acute distress. HEENT: Right eye bandage clean dry intact. Neck:  Without mass or deformity.  Trachea is midline. Lungs:  Clear to auscultate bilaterally without rhonchi, wheeze, or rales. Heart:  Regular rate and rhythm.  Without murmurs, rubs, or gallops. Abdomen:  Soft, nontender, nondistended.  Without guarding or rebound. Extremities: Without cyanosis, clubbing, edema, or obvious deformity. Vascular:  Dorsalis pedis and posterior tibial pulses palpable bilaterally. Skin:  Warm and dry, no erythema, no ulcerations.  The results of significant diagnostics from this hospitalization (including imaging, microbiology, ancillary and laboratory) are listed below for reference.    Microbiology: Recent Results (from the past 240 hour(s))  SARS Coronavirus 2 [facility] order, Performed in [facility] hospital lab)     Status: None   Collection Time: 12/12/18 11:00 PM  Result Value Ref Range Status   SARS Coronavirus 2 NEGATIVE NEGATIVE Final    Comment: (NOTE) If result is NEGATIVE SARS-CoV-2 target nucleic acids are NOT DETECTED. The SARS-CoV-2 RNA is generally detectable in upper and lower  respiratory specimens during the acute phase of infection. The lowest  concentration of SARS-CoV-2 viral copies this assay can detect is 250  copies / mL. A negative result does not preclude SARS-CoV-2 infection  and should not be used as the sole basis for treatment or other  patient management decisions.  A negative result may occur with  improper specimen collection / handling, submission of specimen other  than nasopharyngeal swab, presence of viral mutation(s) within the  areas targeted by this assay, and inadequate number of viral copies  (<250 copies / mL). A negative result must be combined with clinical  observations, patient history, and epidemiological information. If result is POSITIVE SARS-CoV-2 target nucleic acids are DETECTED. The  SARS-CoV-2 RNA is generally detectable in upper and lower  respiratory specimens dur ing the acute phase of infection.  Positive  results are indicative of active infection with SARS-CoV-2.  Clinical  correlation with patient history and other diagnostic information is  necessary to determine patient infection status.  Positive results do  not rule out bacterial infection  or co-infection with other viruses. If result is PRESUMPTIVE POSTIVE SARS-CoV-2 nucleic acids MAY BE PRESENT.   A presumptive positive result was obtained on the submitted specimen  and confirmed on repeat testing.  While 2019 novel coronavirus  (SARS-CoV-2) nucleic acids may be present in the submitted sample  additional confirmatory testing may be necessary for epidemiological  and / or clinical management purposes  to differentiate between  SARS-CoV-2 and other Sarbecovirus currently known to infect humans.  If clinically indicated additional testing with an alternate test  methodology [phone]) is advised. The SARS-CoV-2 RNA is generally  detectable in upper and lower respiratory sp ecimens during the acute  phase of infection. The expected result is Negative. Fact Sheet for Patients:  StrictlyIdeas.no Fact Sheet for Healthcare Providers: BankingDealers.co.za This test is not yet approved or cleared by the [place] FDA and has been authorized for detection and/or diagnosis of SARS-CoV-2 by FDA under an Emergency Use Authorization (EUA).  This EUA will remain in effect (meaning this test can be used) for the duration of the COVID-19 declaration under Section 564(b)(1) of the Act, 21 U.S.C. section 360bbb-3(b)(1), unless the authorization is terminated or revoked sooner. Performed at [facility], [place] [address], [place], [place] [address]     Labs:  Basic Metabolic Panel: Recent Labs  Lab 12/12/18 1847 12/13/18 0230  NA 138 138  K 3.8 3.5  CL 99  101  CO2 26 27  GLUCOSE 121* 97  BUN 8 11  CREATININE 0.70 0.71  CALCIUM 8.9 8.4*  MG  --  1.9  PHOS  --  4.4   Liver Function Tests: Recent Labs  Lab 12/13/18 0230  AST 17  ALT 10  ALKPHOS 82  BILITOT 0.9  PROT 6.4*  ALBUMIN 2.8*   CBC: Recent Labs  Lab 12/12/18 1847 12/13/18 0230  WBC 10.2 7.2  NEUTROABS 8.3*  --   HGB 11.1* 9.7*  HCT 35.2* 30.5*  MCV 95.1 94.4  PLT 261 255   Urinalysis    Component Value Date/Time   COLORURINE YELLOW 12/12/2018 2115   APPEARANCEUR HAZY (A) 12/12/2018 2115   LABSPEC 1.012 12/12/2018 2115   LABSPEC 1.010 03/17/2015 1216   PHURINE 7.0 12/12/2018 2115   GLUCOSEU NEGATIVE 12/12/2018 2115   GLUCOSEU Negative 03/17/2015 1216   HGBUR NEGATIVE 12/12/2018 2115   BILIRUBINUR NEGATIVE 12/12/2018 2115   BILIRUBINUR Negative 03/17/2015 1216   KETONESUR NEGATIVE 12/12/2018 2115   PROTEINUR NEGATIVE 12/12/2018 2115   UROBILINOGEN 0.2 03/17/2015 1216   NITRITE POSITIVE (A) 12/12/2018 2115   LEUKOCYTESUR MODERATE (A) 12/12/2018 2115   LEUKOCYTESUR Trace 03/17/2015 1216   Sepsis Labs Invalid input(s): PROCALCITONIN,  WBC,  LACTICIDVEN Microbiology Recent Results (from the past 240 hour(s))  SARS Coronavirus 2 [facility] order, Performed in [place] hospital lab)     Status: None   Collection Time: 12/12/18 11:00 PM  Result Value Ref Range Status   SARS Coronavirus 2 NEGATIVE NEGATIVE Final    Comment: (NOTE) If result is NEGATIVE SARS-CoV-2 target nucleic acids are NOT DETECTED. The SARS-CoV-2 RNA is generally detectable in upper and lower  respiratory specimens during the acute phase of infection. The lowest  concentration of SARS-CoV-2 viral copies this assay can detect is 250  copies / mL. A negative result does not preclude SARS-CoV-2 infection  and should not be used as the sole basis for treatment or other  patient management decisions.  A negative result may occur with  improper specimen collection /  handling, submission  of specimen other  than nasopharyngeal swab, presence of viral mutation(s) within the  areas targeted by this assay, and inadequate number of viral copies  (<250 copies / mL). A negative result must be combined with clinical  observations, patient history, and epidemiological information. If result is POSITIVE SARS-CoV-2 target nucleic acids are DETECTED. The SARS-CoV-2 RNA is generally detectable in upper and lower  respiratory specimens dur ing the acute phase of infection.  Positive  results are indicative of active infection with SARS-CoV-2.  Clinical  correlation with patient history and other diagnostic information is  necessary to determine patient infection status.  Positive results do  not rule out bacterial infection or co-infection with other viruses. If result is PRESUMPTIVE POSTIVE SARS-CoV-2 nucleic acids MAY BE PRESENT.   A presumptive positive result was obtained on the submitted specimen  and confirmed on repeat testing.  While 2019 novel coronavirus  (SARS-CoV-2) nucleic acids may be present in the submitted sample  additional confirmatory testing may be necessary for epidemiological  and / or clinical management purposes  to differentiate between  SARS-CoV-2 and other Sarbecovirus currently known to infect humans.  If clinically indicated additional testing with an alternate test  methodology [phone]) is advised. The SARS-CoV-2 RNA is generally  detectable in upper and lower respiratory sp ecimens during the acute  phase of infection. The expected result is Negative. Fact Sheet for Patients:  StrictlyIdeas.no Fact Sheet for Healthcare Providers: BankingDealers.co.za This test is not yet approved or cleared by the [place] FDA and has been authorized for detection and/or diagnosis of SARS-CoV-2 by FDA under an Emergency Use Authorization (EUA).  This EUA will remain in effect (meaning this test can be used) for  the duration of the COVID-19 declaration under Section 564(b)(1) of the Act, 21 U.S.C. section 360bbb-3(b)(1), unless the authorization is terminated or revoked sooner. Performed at [facility], [place] [address] [place], [place] [address]      Time coordinating discharge: Over 30 minutes  SIGNED:   Little Ishikawa, DO [facility] 12/13/2018, 2:54 PM Pager   If 7PM-7AM, please contact night-coverage [email or web address] Password [email or web address]

## 2018-12-13 NOTE — ED Notes (Signed)
ED TO INPATIENT HANDOFF REPORT  ED Nurse Name and Phone #: [name] 73 y.o. female Room/Bed: 040C/040C  Code Status   Code Status: Full Code  Home/SNF/Other Home Patient oriented to: self, place, time and situation Is this baseline? Yes   Triage Complete: Triage complete  Chief Complaint fall head injury  Triage Note Arrived by [facility] from dr office. Pt was walking into office for routine checkup and tripped and fell. Hit head on cement, is on plavix. Has hematoma to forehead. No loc. Also has pain to right arm which was recently fractured. c collar applied pta, pt now also has upper back pain. Bed bugs also noted on arrival to triage.   Allergies Allergies  Allergen Reactions  . Shellfish Allergy Anaphylaxis and Other (See Comments)    ANAPHYLAXIS TO OYSTERS  . Aspirin Other (See Comments)    Burns stomach  . Penicillins Nausea And Vomiting and Other (See Comments)    PATIENT HAS HAD A PCN REACTION WITH IMMEDIATE RASH, FACIAL/TONGUE/THROAT SWELLING, SOB, OR LIGHTHEADEDNESS WITH HYPOTENSION:  #  #  YES  #  #  Has patient had a PCN reaction causing severe rash involving mucus membranes or skin necrosis: No Has patient had a PCN reaction that required hospitalization: No Has patient had a PCN reaction occurring within the last 10 years: No If all of the above answers are "NO", then may proceed with Cephalosporin use.   [name] [Naproxen] Nausea And Vomiting  . Brilinta [Ticagrelor] Other (See Comments)    Indigestion  . Ciprofloxacin Nausea And Vomiting  . Doxycycline Nausea And Vomiting  . Evista [Raloxifene] Other (See Comments)    LEG ACHES  . Fosamax [Alendronate Sodium] Other (See Comments)    Leg aches  . Lactose Intolerance (Gi) Nausea And Vomiting  . Latex Rash  . Naproxen Sodium Nausea And Vomiting  . Nitrofurantoin Rash and Other (See Comments)    "I broke out down there"  . Pravachol [Pravastatin] Other (See  Comments)    GI upset   . Sulfa Antibiotics Nausea And Vomiting  . Tape Rash  . Welchol [Colesevelam Hcl] Other (See Comments)    Stomach cramps  . Zocor [Simvastatin] Rash    Level of Care/Admitting Diagnosis ED Disposition    ED Disposition Condition [facility] Area: [place] [[id number]]  Level of Care: Telemetry Cardiac [103]  I expect the patient will be discharged within 24 hours: No (not a candidate for 5C-Observation unit)  Covid Evaluation: Asymptomatic Screening Protocol (No Symptoms)  Diagnosis: Syncope [206001]  Admitting Physician: Toy Baker [[id number]]  Attending Physician: Toy Baker [[id number]]  PT Class (Do Not Modify): Observation [104]  PT Acc Code (Do Not Modify): Observation [10022]       B Medical/Surgery History Past Medical History:  Diagnosis Date  . Anemia    hx  . Anxiety and depression   . Aortic atherosclerosis ([place]) 07/31/2017  . Arthritis    "~ all over my body; in the joints"  . Breast cancer, right ([place]) 3/16  . Coronary artery disease    a.  s/p NSTEMI in June 2017 with DES to mid LAD.  Marland Kitchen Depression   . Gallstones   . GERD (gastroesophageal reflux disease)   . H/O hiatal hernia   . Headache   . Heart murmur   . History of blood transfusion    "when taking chemo & w/one of my knee replacements" (  11/05/2015)  . Hypercholesteremia   . Hypertension   . Iron deficiency anemia 02/16/2017  . Multiple bruises   . Myocardial infarction ([place])   . Orthostatic hypotension   . Personal history of chemotherapy   . Personal history of radiation therapy   . Pneumonia X 1  . Shingles 5/15   hx  . Sliding hiatal hernia s/p lap PEH repair 07/31/2012  . Unstable angina ([place]) 01/23/2012  . Unsteady gait    " I fall easily"  . Wears dentures    partial  . Wears glasses    Past Surgical History:  Procedure Laterality Date  . BREAST BIOPSY Right   . BREAST LUMPECTOMY Right 08/2014  . BREAST LUMPECTOMY  WITH SENTINEL LYMPH NODE BIOPSY Right 09/22/2014   Procedure: RIGHT BREAST LUMPECTOMY WITH NEEDLE LOCALIZATION AND RIGHT AXILLARY SENTINEL LYMPH NODE BIOPSY;  Surgeon: [name], MD;  Location: [facility];  Service: General;  Laterality: Right;  . CARDIAC CATHETERIZATION    . CARDIAC CATHETERIZATION N/A 11/05/2015   Procedure: Left Heart Cath and Coronary Angiography;  Surgeon: Belva Crome, MD;  Location: [facility];  Service: Cardiovascular;  Laterality: N/A;  . CARDIAC CATHETERIZATION N/A 11/05/2015   Procedure: Coronary Stent Intervention;  Surgeon: Belva Crome, MD;  Location: [facility];  Service: Cardiovascular;  Laterality: N/A;  . CARPAL TUNNEL RELEASE Right 09/2015  . CHOLECYSTECTOMY N/A 03/28/2018   Procedure: LAPAROSCOPIC CHOLECYSTECTOMY WITH INTRAOPERATIVE CHOLANGIOGRAM ERAS PATHWAY;  Surgeon: Jovita Kussmaul, MD;  Location: [facility];  Service: General;  Laterality: N/A;  . CORONARY ANGIOPLASTY WITH STENT PLACEMENT  11/05/2015   "1 stent"  . ESOPHAGEAL MANOMETRY N/A 07/02/2012   Procedure: ESOPHAGEAL MANOMETRY (EM);  Surgeon: Lear Ng, MD;  Location: [facility];  Service: Endoscopy;  Laterality: N/A;  schooler to read  . EYE SURGERY Left 2013   tube placed tear duct  . HIATAL HERNIA REPAIR N/A 08/10/2012   Procedure: LAPAROSCOPIC REPAIR OF HIATAL HERNIA WITH MESH AND EGD WITH PEG TUBE PLACEMENT;  Surgeon: Ralene Ok, MD;  Location: [facility];  Service: General;  Laterality: N/A;  . LEFT HEART CATH AND CORONARY ANGIOGRAPHY N/A 08/01/2017   Procedure: LEFT HEART CATH AND CORONARY ANGIOGRAPHY;  Surgeon: Jettie Booze, MD;  Location: [facility];  Service: Cardiovascular;  Laterality: N/A;  . PORT-A-CATH REMOVAL  06/2015  . PORTACATH PLACEMENT Left 09/22/2014   Procedure: INSERTION PORT-A-CATH;  Surgeon: [name], MD;  Location: [facility];  Service: General;  Laterality: Left;  . POSTERIOR LUMBAR FUSION  2016   "had 2 vertebrae fused"  . TONSILLECTOMY   as child  . TOTAL KNEE ARTHROPLASTY Bilateral 2011,2012     A IV Location/Drains/Wounds Patient Lines/Drains/Airways Status   Active Line/Drains/Airways    Name:   Placement date:   Placement time:   Site:   Days:   Peripheral IV 12/13/18 Anterior;Left Hand   12/13/18    0246    Hand   less than 1          Intake/Output Last 24 hours  Intake/Output Summary (Last 24 hours) at 12/13/2018 0352 Last data filed at 12/13/2018 0317 Gross per 24 hour  Intake 500 ml  Output -  Net 500 ml    Labs/Imaging Results for orders placed or performed during the hospital encounter of 12/12/18 (from the past 48 hour(s))  CBC with Differential     Status: Abnormal   Collection Time: 12/12/18  6:47 PM  Result Value Ref Range   WBC 10.2 4.0 - 10.5 K/uL   RBC 3.70 (L) 3.87 - 5.11 MIL/uL   Hemoglobin 11.1 (L) 12.0 - 15.0 g/dL   HCT 35.2 (L) 36.0 - 46.0 %   MCV 95.1 80.0 - 100.0 fL   MCH 30.0 26.0 - 34.0 pg   MCHC 31.5 30.0 - 36.0 g/dL   RDW 13.2 11.5 - 15.5 %   Platelets 261 150 - 400 K/uL   nRBC 0.0 0.0 - 0.2 %   Neutrophils Relative % 82 %   Neutro Abs 8.3 (H) 1.7 - 7.7 K/uL   Lymphocytes Relative 9 %   Lymphs Abs 0.9 0.7 - 4.0 K/uL   Monocytes Relative 6 %   Monocytes Absolute 0.6 0.1 - 1.0 K/uL   Eosinophils Relative 3 %   Eosinophils Absolute 0.3 0.0 - 0.5 K/uL   Basophils Relative 0 %   Basophils Absolute 0.0 0.0 - 0.1 K/uL   Immature Granulocytes 0 %   Abs Immature Granulocytes 0.04 0.00 - 0.07 K/uL    Comment: Performed at [facility], [address] [place], [place] [address]  Basic metabolic panel     Status: Abnormal   Collection Time: 12/12/18  6:47 PM  Result Value Ref Range   Sodium 138 135 - 145 mmol/L   Potassium 3.8 3.5 - 5.1 mmol/L   Chloride 99 98 - 111 mmol/L   CO2 26 22 - 32 mmol/L   Glucose, Bld 121 (H) 70 - 99 mg/dL   BUN 8 8 - 23 mg/dL   Creatinine, Ser 0.70 0.44 - 1.00 mg/dL   Calcium 8.9 8.9 - 10.3 mg/dL   GFR calc non Af Amer >60 >60 mL/min    GFR calc Af Amer >60 >60 mL/min   Anion gap 13 5 - 15    Comment: Performed at [facility], [place] [address], [place], [place] [address]  Urinalysis, Routine w reflex microscopic     Status: Abnormal   Collection Time: 12/12/18  9:15 PM  Result Value Ref Range   Color, Urine YELLOW YELLOW   APPearance HAZY (A) CLEAR   Specific Gravity, Urine 1.012 1.005 - 1.030   pH 7.0 5.0 - 8.0   Glucose, UA NEGATIVE NEGATIVE mg/dL   Hgb urine dipstick NEGATIVE NEGATIVE   Bilirubin Urine NEGATIVE NEGATIVE   Ketones, ur NEGATIVE NEGATIVE mg/dL   Protein, ur NEGATIVE NEGATIVE mg/dL   Nitrite POSITIVE (A) NEGATIVE   Leukocytes,Ua MODERATE (A) NEGATIVE   RBC / HPF 0-5 0 - 5 RBC/hpf   WBC, UA 21-50 0 - 5 WBC/hpf   Bacteria, UA RARE (A) NONE SEEN   Squamous Epithelial / LPF 0-5 0 - 5   Mucus PRESENT     Comment: Performed at [facility], [address], [place], [place] [address]  SARS Coronavirus 2 [facility] order, Performed in [facility] hospital lab)     Status: None   Collection Time: 12/12/18 11:00 PM  Result Value Ref Range   SARS Coronavirus 2 NEGATIVE NEGATIVE    Comment: (NOTE) If result is NEGATIVE SARS-CoV-2 target nucleic acids are NOT DETECTED. The SARS-CoV-2 RNA is generally detectable in upper and lower  respiratory specimens during the acute phase of infection. The lowest  concentration of SARS-CoV-2 viral copies this assay can detect is 250  copies / mL. A negative result does not preclude SARS-CoV-2 infection  and should not be used as the sole basis for treatment  or other  patient management decisions.  A negative result may occur with  improper specimen collection / handling, submission of specimen other  than nasopharyngeal swab, presence of viral mutation(s) within the  areas targeted by this assay, and inadequate number of viral copies  (<250 copies / mL). A negative result must be combined with clinical  observations, patient history, and epidemiological  information. If result is POSITIVE SARS-CoV-2 target nucleic acids are DETECTED. The SARS-CoV-2 RNA is generally detectable in upper and lower  respiratory specimens dur ing the acute phase of infection.  Positive  results are indicative of active infection with SARS-CoV-2.  Clinical  correlation with patient history and other diagnostic information is  necessary to determine patient infection status.  Positive results do  not rule out bacterial infection or co-infection with other viruses. If result is PRESUMPTIVE POSTIVE SARS-CoV-2 nucleic acids MAY BE PRESENT.   A presumptive positive result was obtained on the submitted specimen  and confirmed on repeat testing.  While 2019 novel coronavirus  (SARS-CoV-2) nucleic acids may be present in the submitted sample  additional confirmatory testing may be necessary for epidemiological  and / or clinical management purposes  to differentiate between  SARS-CoV-2 and other Sarbecovirus currently known to infect humans.  If clinically indicated additional testing with an alternate test  methodology [phone]) is advised. The SARS-CoV-2 RNA is generally  detectable in upper and lower respiratory sp ecimens during the acute  phase of infection. The expected result is Negative. Fact Sheet for Patients:  StrictlyIdeas.no Fact Sheet for Healthcare Providers: BankingDealers.co.za This test is not yet approved or cleared by the [place] FDA and has been authorized for detection and/or diagnosis of SARS-CoV-2 by FDA under an Emergency Use Authorization (EUA).  This EUA will remain in effect (meaning this test can be used) for the duration of the COVID-19 declaration under Section 564(b)(1) of the Act, 21 U.S.C. section 360bbb-3(b)(1), unless the authorization is terminated or revoked sooner. Performed at [facility], [place] [address], [place], [place] [address]   Troponin I (High Sensitivity)      Status: None   Collection Time: 12/13/18  1:23 AM  Result Value Ref Range   Troponin I (High Sensitivity) <2 <18 ng/L    Comment: Performed at [facility] [address] [place], [place] [address]  Troponin I (High Sensitivity)     Status: None   Collection Time: 12/13/18  2:30 AM  Result Value Ref Range   Troponin I (High Sensitivity) 3 <18 ng/L    Comment: (NOTE) Elevated high sensitivity troponin I (hsTnI) values and significant  changes across serial measurements may suggest ACS but many other  chronic and acute conditions are known to elevate hsTnI results.  Refer to the "Links" section for chest pain algorithms and additional  guidance. Performed at [facility], [place] [address] [place], [place] [address]   Magnesium     Status: None   Collection Time: 12/13/18  2:30 AM  Result Value Ref Range   Magnesium 1.9 1.7 - 2.4 mg/dL    Comment: Performed at [facility], [place] [address] [place], [place] [address]  Phosphorus     Status: None   Collection Time: 12/13/18  2:30 AM  Result Value Ref Range   Phosphorus 4.4 2.5 - 4.6 mg/dL    Comment: Performed at [facility] [address], [place], [place] [address]  TSH     Status: None  Collection Time: 12/13/18  2:30 AM  Result Value Ref Range   TSH 0.932 0.350 - 4.500 uIU/mL    Comment: Performed by a 3rd Generation assay with a functional sensitivity of <=0.01 uIU/mL. Performed at [facility], [place] [address] [place], [place] [address]   Comprehensive metabolic panel     Status: Abnormal   Collection Time: 12/13/18  2:30 AM  Result Value Ref Range   Sodium 138 135 - 145 mmol/L   Potassium 3.5 3.5 - 5.1 mmol/L   Chloride 101 98 - 111 mmol/L   CO2 27 22 - 32 mmol/L   Glucose, Bld 97 70 - 99 mg/dL   BUN 11 8 - 23 mg/dL   Creatinine, Ser 0.71 0.44 - 1.00 mg/dL   Calcium 8.4 (L) 8.9 - 10.3 mg/dL   Total Protein 6.4 (L) 6.5 - 8.1 g/dL   Albumin 2.8 (L) 3.5 - 5.0 g/dL   AST 17 15 - 41  U/L   ALT 10 0 - 44 U/L   Alkaline Phosphatase 82 38 - 126 U/L   Total Bilirubin 0.9 0.3 - 1.2 mg/dL   GFR calc non Af Amer >60 >60 mL/min   GFR calc Af Amer >60 >60 mL/min   Anion gap 10 5 - 15    Comment: Performed at [facility] [address] [place], [place] [address]  CBC     Status: Abnormal   Collection Time: 12/13/18  2:30 AM  Result Value Ref Range   WBC 7.2 4.0 - 10.5 K/uL   RBC 3.23 (L) 3.87 - 5.11 MIL/uL   Hemoglobin 9.7 (L) 12.0 - 15.0 g/dL   HCT 30.5 (L) 36.0 - 46.0 %   MCV 94.4 80.0 - 100.0 fL   MCH 30.0 26.0 - 34.0 pg   MCHC 31.8 30.0 - 36.0 g/dL   RDW 13.2 11.5 - 15.5 %   Platelets 255 150 - 400 K/uL   nRBC 0.0 0.0 - 0.2 %    Comment: Performed at [facility], [place] [address], [place], [place] [address]   Dg Shoulder Right  Result Date: 12/12/2018 CLINICAL DATA:  73 year old female status post fall with pain. Distal right humerus fracture earlier this month. EXAM: RIGHT SHOULDER - 2+ VIEW COMPARISON:  Right elbow CT 11/24/2018 and earlier. FINDINGS: No glenohumeral joint dislocation. The proximal right humerus appears stable and intact. The visible right clavicle in scapula appear intact. Stable visible right chest and chest wall surgical clips. IMPRESSION: No acute fracture or dislocation identified about the right shoulder. Electronically Signed   By: Genevie Ann M.D.   On: 12/12/2018 17:48   Dg Elbow Complete Right  Result Date: 12/12/2018 CLINICAL DATA:  73 year old female status post fall with pain. Distal humerus fracture at the elbow earlier this month. EXAM: RIGHT ELBOW - COMPLETE 3+ VIEW COMPARISON:  Elbow CT 11/24/2018, and earlier. FINDINGS: Stable radiographic appearance of the elbow since 11/23/2018, with fractures of the distal humerus with anteriorly rotated fragments of the capitellum and trochlea as demonstrated by CT. The proximal radius and ulna appear remain intact. No new No acute osseous abnormality identified. IMPRESSION: Stable distal  humerus fracture as depicted by CT on 11/24/2018. Electronically Signed   By: Genevie Ann M.D.   On: 12/12/2018 17:55   Dg Wrist Complete Right  Result Date: 12/12/2018 CLINICAL DATA:  74 year old female status post fall with pain. Distal right humerus fracture earlier this month. EXAM: RIGHT WRIST - COMPLETE 3+ VIEW  COMPARISON:  Right hand series 05/13/2013. FINDINGS: Chronic appearing fragmentation at the distal pole of the scaphoid, which seems to be a remnant of the trapezium. The trapezoid appears sclerotic and diminutive. There is also cortical irregularity along the radial aspect of the lunate. The distal radius and ulna appear intact.  Thumb IP osteoarthritis. IMPRESSION: 1. 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 2. Difficult to exclude acute fracture of the lunate, although cortical irregularity along its radial margin might also be chronic. 3. Distal radius and ulna intact. Electronically Signed   By: Genevie Ann M.D.   On: 12/12/2018 17:53   Ct Head Wo Contrast  Result Date: 12/12/2018 CLINICAL DATA:  Trip and fall, anticoagulation EXAM: CT HEAD WITHOUT CONTRAST CT ORBITS WITHOUT CONTRAST CT CERVICAL SPINE WITHOUT CONTRAST TECHNIQUE: Multidetector CT imaging of the head, cervical spine, and orbits were performed using the standard protocol without intravenous contrast. Multiplanar CT image reconstructions of the cervical spine and maxillofacial structures were also generated. COMPARISON:  10/25/2018 FINDINGS: CT HEAD FINDINGS Brain: No evidence of acute infarction, hemorrhage, hydrocephalus, extra-axial collection or mass lesion/mass effect. Vascular: No hyperdense vessel or unexpected calcification. CT ORBITS FINDINGS Skull: Normal. Negative for fracture or focal lesion. Orbits: There is high attenuation subconjunctival hemorrhage of the right globe and exophthalmus (series 4, image 15). There are depressed fractures of the right lamina  papyracea with fat and medial rectus herniation (series 14, image 30). The orbital floor is intact. Sinuses: Partial opacification of the right ethmoid air cells. Other: Right frontal scalp and preseptal hematoma. CT CERVICAL SPINE FINDINGS Alignment: Normal. Skull base and vertebrae: No acute fracture. No primary bone lesion or focal pathologic process. Soft tissues and spinal canal: No prevertebral fluid or swelling. No visible canal hematoma. Disc levels: Mild multilevel disc space height loss and osteophytosis. Upper chest: Negative. Other: None. IMPRESSION: 1.  No acute intracranial pathology. 2. There is high attenuation subconjunctival hemorrhage of the right globe and exophthalmus (series 4, image 15). There are depressed fractures of the right lamina papyracea with fat and medial rectus herniation (series 14, image 30). The orbital floor is intact. 3.  Right frontal scalp and preseptal hematoma. 4.  No fracture or static subluxation of the cervical spine. Electronically Signed   By: Eddie Candle M.D.   On: 12/12/2018 17:07   Ct Cervical Spine Wo Contrast  Result Date: 12/12/2018 CLINICAL DATA:  Trip and fall, anticoagulation EXAM: CT HEAD WITHOUT CONTRAST CT ORBITS WITHOUT CONTRAST CT CERVICAL SPINE WITHOUT CONTRAST TECHNIQUE: Multidetector CT imaging of the head, cervical spine, and orbits were performed using the standard protocol without intravenous contrast. Multiplanar CT image reconstructions of the cervical spine and maxillofacial structures were also generated. COMPARISON:  10/25/2018 FINDINGS: CT HEAD FINDINGS Brain: No evidence of acute infarction, hemorrhage, hydrocephalus, extra-axial collection or mass lesion/mass effect. Vascular: No hyperdense vessel or unexpected calcification. CT ORBITS FINDINGS Skull: Normal. Negative for fracture or focal lesion. Orbits: There is high attenuation subconjunctival hemorrhage of the right globe and exophthalmus (series 4, image 15). There are depressed  fractures of the right lamina papyracea with fat and medial rectus herniation (series 14, image 30). The orbital floor is intact. Sinuses: Partial opacification of the right ethmoid air cells. Other: Right frontal scalp and preseptal hematoma. CT CERVICAL SPINE FINDINGS Alignment: Normal. Skull base and vertebrae: No acute fracture. No primary bone lesion or focal pathologic process. Soft tissues and spinal canal: No prevertebral fluid or swelling.  No visible canal hematoma. Disc levels: Mild multilevel disc space height loss and osteophytosis. Upper chest: Negative. Other: None. IMPRESSION: 1.  No acute intracranial pathology. 2. There is high attenuation subconjunctival hemorrhage of the right globe and exophthalmus (series 4, image 15). There are depressed fractures of the right lamina papyracea with fat and medial rectus herniation (series 14, image 30). The orbital floor is intact. 3.  Right frontal scalp and preseptal hematoma. 4.  No fracture or static subluxation of the cervical spine. Electronically Signed   By: Eddie Candle M.D.   On: 12/12/2018 17:07   Ct Orbits Wo Contrast  Result Date: 12/12/2018 CLINICAL DATA:  Trip and fall, anticoagulation EXAM: CT HEAD WITHOUT CONTRAST CT ORBITS WITHOUT CONTRAST CT CERVICAL SPINE WITHOUT CONTRAST TECHNIQUE: Multidetector CT imaging of the head, cervical spine, and orbits were performed using the standard protocol without intravenous contrast. Multiplanar CT image reconstructions of the cervical spine and maxillofacial structures were also generated. COMPARISON:  10/25/2018 FINDINGS: CT HEAD FINDINGS Brain: No evidence of acute infarction, hemorrhage, hydrocephalus, extra-axial collection or mass lesion/mass effect. Vascular: No hyperdense vessel or unexpected calcification. CT ORBITS FINDINGS Skull: Normal. Negative for fracture or focal lesion. Orbits: There is high attenuation subconjunctival hemorrhage of the right globe and exophthalmus (series 4, image 15).  There are depressed fractures of the right lamina papyracea with fat and medial rectus herniation (series 14, image 30). The orbital floor is intact. Sinuses: Partial opacification of the right ethmoid air cells. Other: Right frontal scalp and preseptal hematoma. CT CERVICAL SPINE FINDINGS Alignment: Normal. Skull base and vertebrae: No acute fracture. No primary bone lesion or focal pathologic process. Soft tissues and spinal canal: No prevertebral fluid or swelling. No visible canal hematoma. Disc levels: Mild multilevel disc space height loss and osteophytosis. Upper chest: Negative. Other: None. IMPRESSION: 1.  No acute intracranial pathology. 2. There is high attenuation subconjunctival hemorrhage of the right globe and exophthalmus (series 4, image 15). There are depressed fractures of the right lamina papyracea with fat and medial rectus herniation (series 14, image 30). The orbital floor is intact. 3.  Right frontal scalp and preseptal hematoma. 4.  No fracture or static subluxation of the cervical spine. Electronically Signed   By: Eddie Candle M.D.   On: 12/12/2018 17:07    Pending Labs Unresulted Labs (From admission, onward)    Start     Ordered   12/12/18 1626  Urine culture  ONCE - STAT,   STAT     12/12/18 1626   Signed and Held  Prealbumin  Tomorrow morning,   R     Signed and Held          Vitals/Pain Today's Vitals   12/12/18 2300 12/13/18 0000 12/13/18 0100 12/13/18 0334  BP: (!) 100/47 93/62 (!) 92/55 (!) 104/45  Pulse: 78 64 64 65  Resp: 18 12 12 18   Temp:      TempSrc:      SpO2: 94% 92% 93% 98%  PainSc:        Isolation Precautions Contact precautions  Medications Medications  ezetimibe (ZETIA) tablet 10 mg (10 mg Oral Given 12/13/18 0239)  escitalopram (LEXAPRO) tablet 20 mg (has no administration in time range)  pantoprazole (PROTONIX) EC tablet 40 mg (has no administration in time range)  famotidine (PEPCID) tablet 20 mg (has no administration in time  range)  LORazepam (ATIVAN) tablet 0.5 mg (0.5 mg Oral Given 12/13/18 0239)  gabapentin (NEURONTIN) capsule 600 mg (600 mg Oral  Given 12/13/18 0239)  montelukast (SINGULAIR) tablet 10 mg (10 mg Oral Given 12/13/18 0239)  acetaminophen (TYLENOL) tablet 650 mg (has no administration in time range)    Or  acetaminophen (TYLENOL) suppository 650 mg (has no administration in time range)  ondansetron (ZOFRAN) tablet 4 mg (has no administration in time range)    Or  ondansetron (ZOFRAN) injection 4 mg (has no administration in time range)  0.9 %  sodium chloride infusion ( Intravenous New Bag/Given 12/13/18 0333)  traMADol (ULTRAM) tablet 50 mg (has no administration in time range)  cefTRIAXone (ROCEPHIN) 1 g in sodium chloride 0.9 % 100 mL IVPB (has no administration in time range)  fluorescein ophthalmic strip 1 strip (1 strip Right Eye Given 12/12/18 1857)  tetracaine (PONTOCAINE) 0.5 % ophthalmic solution 2 drop (2 drops Both Eyes Given by Other 12/12/18 1857)  Tdap (BOOSTRIX) injection 0.5 mL (0.5 mLs Intramuscular Given 12/12/18 2132)  acetaminophen (TYLENOL) tablet 1,000 mg (1,000 mg Oral Given 12/12/18 2131)  oxyCODONE (Oxy IR/ROXICODONE) immediate release tablet 5 mg (5 mg Oral Given 12/12/18 2131)  ondansetron (ZOFRAN-ODT) disintegrating tablet 4 mg (4 mg Oral Given 12/12/18 2156)  cefTRIAXone (ROCEPHIN) 1 g in sodium chloride 0.9 % 100 mL IVPB (0 g Intravenous Stopped 12/13/18 0317)  sodium chloride 0.9 % bolus 500 mL (0 mLs Intravenous Stopped 12/13/18 0317)    Mobility walks Moderate fall risk   Focused Assessments    R Recommendations: See Admitting Provider Note  Report given to:   Additional Notes:

## 2018-12-13 NOTE — Progress Notes (Signed)
Carotid duplex  has been completed.   Preliminary results in CV Proc.   Abram Sander 12/13/2018 9:35 AM

## 2018-12-13 NOTE — ED Notes (Signed)
Apply pur wick with chuxs  Under patient

## 2018-12-13 NOTE — Evaluation (Signed)
Occupational Therapy Evaluation Patient Details Name: Angela Bond MRN: [id number] DOB: 12/25/1945 Today's Date: 12/13/2018    History of Present Illness 73 y.o. female with medical history significant on CAD s/p non-STEMI with drug-eluting stent to her LAD in 2017 (99% bifurcation lesion in the LAD that was stented and had a jailed diagonal that was dilated), orthostatic hypotension, right breast CA, GERD, HTN and HLD history of breast cancer, depression, GERD, iron  deficiency anemia. Presented with syncope and fall while in cardiology office.She had a recent fall on 10 July onto her right extremity was seen in emergency department showed nondisplaced fracture of the distal humerus. She was in the cardiology office for clearance prior to her orthopedic repair.   Clinical Impression   Pt admitted with the above diagnoses and presents with below problem list. Pt will benefit from continued acute OT to address the below listed deficits and maximize independence with basic ADLs prior to d/c home. At baseline, pt is independent with basic ADLs, drives, and is sole caregiver to spouse who has memory issues. Pt endorses 3 falls in the last month and ongoing struggles with instrumental ADLs such as basic housecleaning tasks. Pt has little support at home, reports having a neighbor who helps a bit. Pt is currently min to mod A with basic ADLs, toilet transfers, and functional mobility. Remains a high fall risk. Pt reports that she has to return home as soon as possible to care for spouse. Further complicating her situation is the discovery of bed bugs which may limit in-home services. Recommend maximizing HH services as much and as soon as possible.      Follow Up Recommendations  SNF    Equipment Recommendations  3 in 1 bedside commode    Recommendations for Other Services       Precautions / Restrictions Precautions Precautions: Fall;Other (comment)(recent nondisplaced R distal humerus fx,  surgery planned) Required Braces or Orthoses: Sling Restrictions Weight Bearing Restrictions: Yes RUE Weight Bearing: Non weight bearing      Mobility Bed Mobility Overal bed mobility: Needs Assistance Bed Mobility: Supine to Sit;Sit to Supine     Supine to sit: Min guard;HOB elevated Sit to supine: Supervision;HOB elevated   General bed mobility comments: supervision to min guard for safety  Transfers Overall transfer level: Needs assistance Equipment used: None Transfers: Sit to/from Stand Sit to Stand: Min guard         General transfer comment: to/from EOB. min guard to steady, pt using rail to steady during transition.    Balance Overall balance assessment: Needs assistance;History of Falls Sitting-balance support: Single extremity supported;No upper extremity supported;Feet supported Sitting balance-Leahy Scale: Fair     Standing balance support: No upper extremity supported Standing balance-Leahy Scale: Fair Standing balance comment: pt able to stand EOB about 3-4 minutes, using BLE pressed into bed frame for stability. sidestepped with rail support                           ADL either performed or assessed with clinical judgement   ADL Overall ADL's : Needs assistance/impaired Eating/Feeding: Set up;Sitting   Grooming: Set up;Minimal assistance;Sitting   Upper Body Bathing: Moderate assistance;Sitting   Lower Body Bathing: Moderate assistance;Minimal assistance;Sitting/lateral leans;Sit to/from stand   Upper Body Dressing : Minimal assistance;Sitting   Lower Body Dressing: Moderate assistance;Minimal assistance;Sitting/lateral leans;Sit to/from stand   Toilet Transfer: Moderate assistance;Ambulation   Toileting- Clothing Manipulation and Hygiene: Moderate assistance  Functional mobility during ADLs: Moderate assistance General ADL Comments: Pt completed bed mobility and stood EOB for orthostatic readings. Mobility assist level per  conversation with PT. Discussed using BSC as needed for urgency to void as part of fall prevention plan.     Vision Baseline Vision/History: Wears glasses       Perception     Praxis      Pertinent Vitals/Pain Pain Assessment: Faces Faces Pain Scale: Hurts a little bit Pain Location: unspecified Pain Descriptors / Indicators: Sore Pain Intervention(s): Monitored during session;Limited activity within patient's tolerance     Hand Dominance Right   Extremity/Trunk Assessment Upper Extremity Assessment Upper Extremity Assessment: RUE deficits/detail RUE Deficits / Details: recent nondisplaced distal humerus fx, in sling, surgery planned at some point RUE: Unable to fully assess due to immobilization   Lower Extremity Assessment Lower Extremity Assessment: Generalized weakness       Communication Communication Communication: No difficulties   Cognition Arousal/Alertness: Awake/alert Behavior During Therapy: Flat affect;WFL for tasks assessed/performed("I feel so depressed.") Overall Cognitive Status: Within Functional Limits for tasks assessed                                     General Comments  Pt reporting weakness in BLE in standing. Denied dizziness/lightheadedness. Orthostatice vitals: supine 109/58, sitting 108/61, standing at 0 minutes 107/79, standing at 3 minutes 94/66.    Exercises     Shoulder Instructions      Home Living Family/patient expects to be discharged to:: Private residence Living Arrangements: Spouse/significant other Available Help at Discharge: Other (Comment);Available PRN/intermittently(neighbor) Type of Home: House             Bathroom Shower/Tub: Tub/shower unit             Additional Comments: spouse has memory issues      Prior Functioning/Environment Level of Independence: Independent        Comments: caregiver to spouse who has memory issues. endorses difficulty with instrumental ADLs such as basic  housecleaning "I can't get in the shower because it's too dirty and I can't clean it."        OT Problem List: Decreased strength;Decreased activity tolerance;Impaired balance (sitting and/or standing);Decreased knowledge of use of DME or AE;Decreased knowledge of precautions;Impaired UE functional use;Pain;Cardiopulmonary status limiting activity      OT Treatment/Interventions: Self-care/ADL training;DME and/or AE instruction;Energy conservation;Therapeutic activities;Patient/family education;Balance training    OT Goals(Current goals can be found in the care plan section) Acute Rehab OT Goals Patient Stated Goal: home as soon s possible to care for spouse OT Goal Formulation: With patient Time For Goal Achievement: 12/20/18 Potential to Achieve Goals: Good ADL Goals Pt Will Perform Grooming: with modified independence;sitting Pt Will Perform Upper Body Bathing: with modified independence;sitting;with set-up Pt Will Perform Lower Body Bathing: with min guard assist;sit to/from stand Pt Will Perform Upper Body Dressing: with set-up;sitting Pt Will Perform Lower Body Dressing: with min guard assist;sit to/from stand Pt Will Transfer to Toilet: with min guard assist;ambulating Pt Will Perform Toileting - Clothing Manipulation and hygiene: with min guard assist;sit to/from stand;sitting/lateral leans  OT Frequency: Min 3X/week   Barriers to D/C: Decreased caregiver support;Inaccessible home environment  pt verbalizing that she needs to go home as soon as possible because she is concerned about spouse with memory issues being home alone.        Co-evaluation  AM-PAC OT "6 Clicks" Daily Activity     Outcome Measure Help from another person eating meals?: None Help from another person taking care of personal grooming?: A Little Help from another person toileting, which includes using toliet, bedpan, or urinal?: A Lot Help from another person bathing (including  washing, rinsing, drying)?: A Lot Help from another person to put on and taking off regular upper body clothing?: A Little Help from another person to put on and taking off regular lower body clothing?: A Lot 6 Click Score: 16   End of Session Equipment Utilized During Treatment: Other (comment)(sling)  Activity Tolerance: Patient tolerated treatment well Patient left: in bed;with call bell/phone within reach;with bed alarm set  OT Visit Diagnosis: Unsteadiness on feet (R26.81);Pain;History of falling (Z91.81);Muscle weakness (generalized) (M62.81)                Time: 4917-9150 OT Time Calculation (min): 21 min Charges:  OT General Charges $OT Visit: 1 Visit OT Evaluation $OT Eval Low Complexity: Moran, OT Acute Rehabilitation Services Pager: [phone] Office: [phone]   Hortencia Pilar 12/13/2018, 10:51 AM

## 2018-12-13 NOTE — Progress Notes (Signed)
Patient stating that she has to get home, her husband is home alone and has "memory impairment issues". Paged the MD to let him know. Will continue to monitor.

## 2018-12-13 NOTE — Consult Note (Signed)
[name]                                                                               12/13/2018                                             Ophthalmology Consultation                                         Consult requested by: Dr. Varney Biles  Reason for consultation:  Right globe injury with eyelid laceration and periorbital ecchymosis  HPI:   Angela Bond is a 73 y.o. female we were consulted for evaluation after a fall with right globe trauma, ecchymosis, subconjuntival hemorrhage and eyelid lacerations.  Patient was at her cardiology appointment and fell when walking over to the lab.  She is unsure exactly what happened, but feels her leg gave out.  She denies weakness, numbness or syncopal type of episode.  She hit her head/face and landed on her right elbow. She is being followed by Dr. Burney Gauze for a fracture of her distal humerus with plans for surgery on Friday.    She notes her vision is blurry, however states she can read the eye chart - although a little blurry.  Pertinent Medical History:   Active Ambulatory Problems    Diagnosis Date Noted  . Unstable angina ([place]) 01/23/2012  . Sliding hiatal hernia s/p lap PEH repair 07/31/2012  . Paroxysmal atrial fibrillation ([place]) 08/12/2012  . Hypertension   . GERD (gastroesophageal reflux disease)   . Depression   . Hypercholesteremia   . Arthritis   . Lumbar spinal stenosis 07/31/2014  . Breast cancer of upper-outer quadrant of right female breast ([place]) 09/09/2014  . Hypokalemia 10/21/2014  . Orthostatic hypotension 11/19/2015  . CAD (coronary artery disease), native coronary artery 01/16/2016  . Iron deficiency anemia 02/16/2017  . Obesity (BMI 30-39.9) 07/31/2017  . Aortic atherosclerosis ([place]) 07/31/2017  . Gallstones 03/28/2018  . Hypomagnesemia 08/15/2018  . Fever 08/15/2018  . Suspected Covid-19 Virus Infection 08/17/2018  . Asymptomatic bacteriuria 08/17/2018   Resolved Ambulatory Problems   Diagnosis Date Noted  . Abdominal pain, unspecified site 10/12/2012  . Chest pressure 09/20/2013  . Hypotension due to drugs 09/20/2013  . Shingles 09/23/2013  . Hyponatremia 09/25/2013  . Hyperkalemia 09/26/2013  . AKI (acute kidney injury) ([place]) 09/28/2013  . Fall 10/16/2014  . Neutropenia ([place]) 10/21/2014  . UTI (lower urinary tract infection) 10/21/2014  . Anemia due to chemotherapy 12/16/2014  . Neutropenia with fever ([place]) 12/23/2014  . Neutropenic fever ([place]) 12/24/2014  . Dehydration 12/24/2014  . Ataxia 09/15/2015  . NSTEMI (non-ST elevated myocardial infarction) ([place]) 11/05/2015  . Esophageal reflux   . Atypical chest pain   . Cardiomyopathy, ischemic 11/19/2015  . Precordial pain 01/15/2016   Past Medical History:  Diagnosis Date  . Anemia   . Anxiety and depression   . Breast cancer, right ([place]) 3/16  .  Coronary artery disease   . H/O hiatal hernia   . Headache   . Heart murmur   . History of blood transfusion   . Multiple bruises   . Myocardial infarction ([place])   . Personal history of chemotherapy   . Personal history of radiation therapy   . Pneumonia X 1  . Unsteady gait   . Wears dentures   . Wears glasses       Pertinent Ophthalmic History: None  Current Eye Medications: none  Systemic medications on admission:   No medications prior to admission.        ROS:      Constitutional:  No weight loss, night sweats, Fevers, chills, fatigue, weight loss  Eyes: Swelling, difficulty opening eye/pain HENT:  + right orbit edema/ecchymosis/soreness associated with fall No headaches, Difficulty swallowing,Tooth/dental problems,Sore throat,  No sneezing, itching, ear ache, nasal congestion, post nasal drip,  Cardio-vascular:  No chest pain, Orthopnea, PND, anasarca, dizziness, palpitations.no Bilateral lower extremity swelling  GI:  No heartburn, indigestion, , nausea, vomiting, diarrhea, change in bowel habits, loss of appetite, melena, blood in  stool, hematemesis Resp:  no shortness of breath at rest. No dyspnea on exertion, No excess mucus, no productive cough, No non-productive cough, No coughing up of blood.No change in color of mucus.No wheezing. Skin:  no rash or lesions. No jaundice GU:  no dysuria, change in color of urine, no urgency or frequency. No straining to urinate.  No flank pain.  Musculoskeletal:  No joint pain or no joint swelling. No decreased range of motion. No back pain.  Psych:  No change in mood or affect. No depression or anxiety. No memory loss.  Neuro: no localizing neurological complaints, no tingling, no weakness, no double vision, no gait abnormality, no slurred speech, no confusion  Visual Fields: FTC OU       Pupils:  Pharmacologically dilated at my direction before exam   Equal, brisk, no APD      Near acuity:   20/100     OD        20/50      OS     TA:       OD: 25 mmhg    OS: 17 mmhg  by [place]      External:   OD:  ecchymosis   OS:  Normal      Forced ductions not possible due to degree of subconjuntival hemorrhage and eyelid edema - deferred to oculoplastics  Anterior segment exam:  By penlight     Conjunctiva:  OD:  Subconjunctival hemorrhage   OS:  Quiet     Cornea:    OD:  Mild central corneal epithelial irregularity   OS: Clear, no fluorescein stain      Anterior Chamber:   OD:  Deep/quiet      OS:  Deep/quiet     Iris:    OD:  Normal       OS:  Normal      Lens:    OD:  Clear         OS:  Clear          Optic disc:  OD:  Difficult view due to eyelid edema - deferred     Macula, vessels, periphery   OD: deferred  MRI reviewed - globe intact, medial wall fracture without obvious entrapment of medial rectus.  Impression:  Subconjuntival hemorrhage, periorbital edema/ecchymosis, eyelid lacerations upper and lower, ptosis, medial wall fracture with restriction of lateral  gaze OD, and right eye pain  Recommendations/Plan: Erythromycin ophthalmic  ointment to eyelid lacerations BID to TID.  F/u with oculoplastics to determine repair of medial wall fracture.  Entrapment unlikely based on MRI and lack of bradycardia on lateral gaze.  F/u with retina (Dr. Posey Pronto) for dilated exam to evalaute for retinal tear/detachment in 1-2 months after edema resolved.  I've discussed these findings with the attending. Please contact our office with any questions or concerns.  Thank you for calling us to care for this patient .  Royston Cowper

## 2018-12-13 NOTE — Evaluation (Signed)
Physical Therapy Evaluation Patient Details Name: Angela Bond MRN: [id number] DOB: January 17, 1946 Today's Date: 12/13/2018   History of Present Illness  73 y.o. female with medical history significant on CAD s/p non-STEMI with drug-eluting stent to her LAD in 2017 (99% bifurcation lesion in the LAD that was stented and had a jailed diagonal that was dilated), orthostatic hypotension, right breast CA, GERD, HTN and HLD history of breast cancer, depression, GERD, iron  deficiency anemia. Presented with syncope and fall while in cardiology office.She had a recent fall on 10 July onto her right extremity was seen in emergency department showed nondisplaced fracture of the distal humerus. She was in the cardiology office for clearance prior to her orthopedic repair.  Clinical Impression  Pt is up to walk with PT and used HHA due to sling on RUE, but pt is unstable.  She is reluctant to use an AD, but also not as open to going to rehab.  Have concern about her ability to get help at home, and has been unable to get housework done to care for herself and husband, as well as having three falls this month.  Will continue with PT acutely to focus on mobility and to see if a Hemiwalker might give her more stability, considering she is likely to go directly home from the hosp.  HHPT may be difficult with her ongoing issue of bed bugs at home.    Follow Up Recommendations SNF;Supervision for mobility/OOB    Equipment Recommendations  None recommended by PT    Recommendations for Other Services       Precautions / Restrictions Precautions Precautions: Fall;Other (comment) Precaution Comments: R distal humeral fracture Required Braces or Orthoses: Sling Restrictions Weight Bearing Restrictions: Yes RUE Weight Bearing: Non weight bearing      Mobility  Bed Mobility Overal bed mobility: Needs Assistance Bed Mobility: Supine to Sit;Sit to Supine     Supine to sit: Min guard Sit to supine: Min  guard   General bed mobility comments: assist to be sure RUE is not compromised for restrictions  Transfers Overall transfer level: Needs assistance Equipment used: 1 person hand held assist Transfers: Sit to/from Stand Sit to Stand: Min guard         General transfer comment: to/from EOB. min guard to steady, pt using rail to steady during transition.  Ambulation/Gait Ambulation/Gait assistance: Min guard Gait Distance (Feet): 50 Feet(25 x 2) Assistive device: 1 person hand held assist Gait Pattern/deviations: Step-through pattern;Step-to pattern;Wide base of support;Shuffle;Narrow base of support;Drifts right/left;Decreased stride length Gait velocity: reduced Gait velocity interpretation: <1.8 ft/sec, indicate of risk for recurrent falls General Gait Details: pt is unsteady with sling on and is feeling she can manage at home  Stairs            Wheelchair Mobility    Modified Rankin (Stroke Patients Only)       Balance Overall balance assessment: Needs assistance;History of Falls Sitting-balance support: Feet supported Sitting balance-Leahy Scale: Fair     Standing balance support: Single extremity supported Standing balance-Leahy Scale: Fair Standing balance comment: pt was unsteady in dynamic task                             Pertinent Vitals/Pain Pain Assessment: Faces Pain Score: 2  Faces Pain Scale: Hurts a little bit Pain Location: R UE Pain Descriptors / Indicators: Sore Pain Intervention(s): Limited activity within patient's tolerance;Monitored during session;Repositioned  Home Living Family/patient expects to be discharged to:: Private residence Living Arrangements: Spouse/significant other Available Help at Discharge: Other (Comment);Available PRN/intermittently(neighbor) Type of Home: House         Home Equipment: Walker - 2 wheels;Cane - single point Additional Comments: spouse has dementia    Prior Function Level of  Independence: Independent         Comments: needs assistance for daily housework     Hand Dominance   Dominant Hand: Right    Extremity/Trunk Assessment   Upper Extremity Assessment Upper Extremity Assessment: RUE deficits/detail RUE Deficits / Details: recent nondisplaced distal humerus fx, in sling, surgery planned at some point RUE: Unable to fully assess due to pain;Unable to fully assess due to immobilization RUE Coordination: decreased gross motor    Lower Extremity Assessment Lower Extremity Assessment: Generalized weakness    Cervical / Trunk Assessment Cervical / Trunk Assessment: Kyphotic  Communication   Communication: No difficulties  Cognition Arousal/Alertness: Awake/alert Behavior During Therapy: WFL for tasks assessed/performed Overall Cognitive Status: Within Functional Limits for tasks assessed                                 General Comments: pt follows instructions for PT but not realistic about home      General Comments General comments (skin integrity, edema, etc.): pt has bandage on R eye, and this is giving her less information to steady herself.  Has tendency to walk into R side on the furniture unless redirected    Exercises     Assessment/Plan    PT Assessment Patient needs continued PT services  PT Problem List Decreased strength;Decreased range of motion;Decreased activity tolerance;Decreased balance;Decreased mobility;Decreased coordination;Decreased safety awareness;Cardiopulmonary status limiting activity;Pain       PT Treatment Interventions DME instruction;Gait training;Functional mobility training;Therapeutic activities;Therapeutic exercise;Balance training;Neuromuscular re-education;Patient/family education    PT Goals (Current goals can be found in the Care Plan section)  Acute Rehab PT Goals Patient Stated Goal: to go directly home PT Goal Formulation: With patient Time For Goal Achievement:  12/27/18 Potential to Achieve Goals: Fair    Frequency Min 2X/week   Barriers to discharge Decreased caregiver support home with husband and cannot manage the housework    Co-evaluation               AM-PAC PT "6 Clicks" Mobility  Outcome Measure Help needed turning from your back to your side while in a flat bed without using bedrails?: None Help needed moving from lying on your back to sitting on the side of a flat bed without using bedrails?: A Little Help needed moving to and from a bed to a chair (including a wheelchair)?: A Little Help needed standing up from a chair using your arms (e.g., wheelchair or bedside chair)?: A Little Help needed to walk in hospital room?: A Little Help needed climbing 3-5 steps with a railing? : A Lot 6 Click Score: 18    End of Session Equipment Utilized During Treatment: Gait belt Activity Tolerance: Patient limited by fatigue;Treatment limited secondary to medical complications (Comment)(unsteady with both sling and R eye covered) Patient left: in bed;with call bell/phone within reach;with bed alarm set Nurse Communication: Mobility status PT Visit Diagnosis: Unsteadiness on feet (R26.81);Muscle weakness (generalized) (M62.81);History of falling (Z91.81);Repeated falls (R29.6)    Time: 5465-0354 PT Time Calculation (min) (ACUTE ONLY): 30 min   Charges:   PT Evaluation $PT Eval Moderate Complexity: 1 Mod  PT Treatments $Gait Training: 8-22 mins       Ramond Dial 12/13/2018, 11:37 AM   Mee Hives, PT MS Acute Rehab Dept. Number: [place] [facility] [place]

## 2018-12-13 NOTE — Procedures (Signed)
Echo attempted. Patient refused test, stating "I have got to get out of here. I don't want no test."

## 2018-12-14 LAB — URINE CULTURE: Culture: >=100000 — AB

## 2018-12-17 ENCOUNTER — Other Ambulatory Visit: Payer: Self-pay | Admitting: Orthopedic Surgery

## 2018-12-18 NOTE — Telephone Encounter (Signed)
Follow Up   Patient called in to let the office know that she now has a black eye after her fall and her eye doctor is taking care of it. Just wanted to make office aware that she is ok.

## 2018-12-18 NOTE — Telephone Encounter (Signed)
Noted  

## 2018-12-20 ENCOUNTER — Telehealth: Payer: Self-pay | Admitting: Cardiology

## 2018-12-20 MED ORDER — CLOPIDOGREL BISULFATE 75 MG PO TABS: 75.0000 mg | ORAL_TABLET | Freq: Every day | ORAL | 3 refills | Status: DC

## 2018-12-20 NOTE — Telephone Encounter (Signed)
New message      [facility] Pre-operative Risk Assessment    Request for surgical clearance:  1. What type of surgery is being performed? Orif right distal humerus   2. When is this surgery scheduled? 12/25/2018  3. What type of clearance is required (medical clearance vs. Pharmacy clearance to hold med vs. Both)? both  4. Are there any medications that need to be held prior to surgery and how long?Plavix, wants to know how long to hold   5. Practice name and name of physician performing surgery?[facility], Dr. Altamese [place]  6. What is your office phone number [phone]   7.   What is your office fax number [phone]  8.   Anesthesia type (None, local, MAC, general) ? General    Maryjane Hurter 12/20/2018, 4:10 PM  _________________________________________________________________   (provider comments below)

## 2018-12-20 NOTE — Telephone Encounter (Signed)
   Primary Cardiologist: Candee Furbish, MD  Chart reviewed as part of pre-operative protocol coverage. Patient was contacted 12/20/2018 in reference to pre-operative risk assessment for pending surgery as outlined below.  Angela Bond was last seen on 12/12/2018 by Truitt Merle, NP.  Since that day, Angela Bond has done well from a cardiac standpoint. She can walk >0.5 miles without experiencing chest pain or SOB. No complaints of palpitations. She reports being out of her plavix for the past 4 days due to not being able to afford her prescription, though recently found out that her medications are free through a mail in pharmacy. I sent an updated prescription for plavix today which she was instructed to restart after her surgery when cleared to do so by her orthopedist.   Therefore, based on ACC/AHA guidelines, the patient would be at acceptable risk for the planned procedure without further cardiovascular testing.   Per prior preoperative assessments by Dr. Marlou Porch and no interval change in cardiac history, patient can hold plavix x5 days prior to her upcoming orthopedic surgery.  I will route this recommendation to the requesting party via Epic fax function and remove from pre-op pool.  Please call with questions.  Abigail Butts, PA-C 12/20/2018, 4:44 PM

## 2018-12-21 ENCOUNTER — Other Ambulatory Visit: Payer: Self-pay

## 2018-12-21 ENCOUNTER — Encounter (HOSPITAL_COMMUNITY): Payer: Self-pay | Admitting: *Deleted

## 2018-12-21 NOTE — Progress Notes (Signed)
Pt denies SOB and chest pain. Pt stated that she is under the care of Dr. Marlou Porch, Cardiology and Dr. Marisue Humble, PCP. Pt made aware to stop taking vitamins, fish oil and herbal medications. Do not take any NSAIDs ie: Ibuprofen, Advil, Naproxen (Aleve), Motrin, BC and Goody Powder. Pt stated "  I haven't had Plavix in 5 days, they were out, I will have it after surgery. "  Pt stated that she has transportation issues however, she will try to get a ride to her scheduled COVID-19 test on 12/24/18. Nurse provided pt with Short Stay number for pt concerns. Pt denies that she and family members tested positive for COVID-19; pt reminded to quarantine.   Coronavirus Screening  Pt denies that she and family members experienced the following symptoms:  Cough yes/no: No Fever (>100.41F)  yes/no: No Runny nose yes/no: No Sore throat yes/no: No Difficulty breathing/shortness of breath  yes/no: No  Have you or a family member traveled in the last 14 days and where? yes/no: No  Pt reminded that hospital visitation restrictions are in effect and the importance of the restrictions.   Pt verbalized understanding of all pre-op instructions.  Pt chart forwarded to PA, Anesthesiology, to review cardiac clearance note in Epic.

## 2018-12-24 ENCOUNTER — Other Ambulatory Visit (HOSPITAL_COMMUNITY)
Admission: RE | Admit: 2018-12-24 | Discharge: 2018-12-24 | Disposition: A | Discharge status: Home / Self Care | Payer: Medicare Other | Source: Ambulatory Visit | Attending: Orthopedic Surgery | Admitting: Orthopedic Surgery

## 2018-12-24 ENCOUNTER — Inpatient Hospital Stay (HOSPITAL_COMMUNITY): Admission: RE | Admit: 2018-12-24 | Payer: Medicare Other | Source: Ambulatory Visit

## 2018-12-24 DIAGNOSIS — Z20828 Contact with and (suspected) exposure to other viral communicable diseases: Secondary | ICD-10-CM | POA: Insufficient documentation

## 2018-12-24 DIAGNOSIS — Z01812 Encounter for preprocedural laboratory examination: Secondary | ICD-10-CM | POA: Insufficient documentation

## 2018-12-24 LAB — SARS CORONAVIRUS 2 (TAT 6-24 HRS): SARS Coronavirus 2: NEGATIVE

## 2018-12-24 MED ORDER — VANCOMYCIN HCL IN DEXTROSE 1-5 GM/200ML-% IV SOLN
1000.0000 mg | INTRAVENOUS | Status: AC
  Administered 2018-12-25: 1000 mg via INTRAVENOUS

## 2018-12-24 NOTE — Anesthesia Preprocedure Evaluation (Addendum)
Anesthesia Evaluation  Patient identified by MRN, date of birth, ID band Patient awake    Reviewed: Allergy & Precautions, NPO status , Patient's Chart, lab work & pertinent test results  History of Anesthesia Complications Negative for: history of anesthetic complications  Airway Mallampati: III  TM Distance: >3 FB Neck ROM: Full    Dental  (+) Dental Advisory Given, Partial Upper   Pulmonary neg pulmonary ROS,    Pulmonary exam normal        Cardiovascular hypertension, (-) angina+ CAD, + Past MI and + Cardiac Stents  Normal cardiovascular exam   Orthostatic hypotension  '20 Carotid US - 1-39% b/l ICAS  '19 TTE - EF 55% to 60%. PA peak pressure: 32 mm Hg. Trivial MR and TR.  '19 Cath - Mid RCA lesion is 25% stenosed. Previously placed Prox LAD stent, DES,is widely patent. Ost 1st Diag lesion is 75% stenosed. The ostium is jailed by the stent. THis is a small vessel. Mid LAD lesion is 25% stenosed. The left ventricular systolic function is normal. LV end diastolic pressure is mildly elevated. The left ventricular ejection fraction is 55-65% by visual estimate. There is no aortic valve stenosis  Updated cardiology preoperative input as outlined by Roby Lofts, PA-C on 12/20/18: "Tressia Miners last seen on 7/29/2020by Truitt Merle, NP. Since that day, DESTA BUJAK done well from a cardiac standpoint.She can walk >0.5 miles without experiencing chest pain or SOB. No complaints of palpitations. She reports being out of her plavix for the past 4 days due to not being able to afford her prescription, though recently found out that her medications are free through a mail in pharmacy. I sent an updated prescription for plavix today which she was instructed to restart after her surgery when cleared to do so by her orthopedist.Therefore, based on ACC/AHA guidelines, the patient would be at acceptable risk for  the planned procedure without further cardiovascular testing. Per prior preoperative assessments by Dr. Marlou Porch and no interval change in cardiac history, patient can hold plavix x5 days prior to her upcoming orthopedic surgery."   Neuro/Psych  Headaches, PSYCHIATRIC DISORDERS Anxiety Depression    GI/Hepatic Neg liver ROS, hiatal hernia, GERD  Medicated and Controlled,  Endo/Other   Obesity   Renal/GU negative Renal ROS     Musculoskeletal  (+) Arthritis ,   Abdominal   Peds  Hematology  (+) anemia ,   Anesthesia Other Findings   Reproductive/Obstetrics                           Anesthesia Physical Anesthesia Plan  ASA: III  Anesthesia Plan: General   Post-op Pain Management:  Regional for Post-op pain   Induction: Intravenous  PONV Risk Score and Plan: 3 and Treatment may vary due to age or medical condition, Ondansetron and Dexamethasone  Airway Management Planned: LMA  Additional Equipment: None  Intra-op Plan:   Post-operative Plan: Extubation in OR  Informed Consent: I have reviewed the patients History and Physical, chart, labs and discussed the procedure including the risks, benefits and alternatives for the proposed anesthesia with the patient or authorized representative who has indicated his/her understanding and acceptance.     Dental advisory given  Plan Discussed with: CRNA and Anesthesiologist  Anesthesia Plan Comments:       Anesthesia Quick Evaluation

## 2018-12-24 NOTE — Progress Notes (Signed)
Anesthesia Chart Review: SAME DAY WORK-UP   Case: [id number] Date/Time: 12/25/18 0745   Procedure: OPEN REDUCTION INTERNAL FIXATION (ORIF) DISTAL HUMERUS FRACTURE (Right )   Anesthesia type: General   Pre-op diagnosis: RIGHT TROCHLEAR SHEAR FRACTURE   Location: [place] OR ROOM 03 / [place] [facility]   Surgeon: Altamese Bath, MD      DISCUSSION: Patient is a 73 year old female scheduled for the above procedure.  History includes CAD (ACS, s/p DES LAD, the DIAG branch "jailed" ended with < 40% ostial obstruction 11/05/15), HTN (and orthostatic hypotension, metoprolol & ACE-I discontinued), hypercholesterolemia, heart murmur (trivial MR/TR 07/2017 echo), dysrhythmia (post-op PAF 07/2012 following hiatal hernia repair, also hypokalemic), right breast cancer (s/p right lumpectomy 09/22/14, s/p chemoradiation), GERD, hiatal hernia (s/p repair, EGD, PEG placement 08/10/12), anemia, unsteady gait, s/p L4-S1 PLIF 07/31/14, s/p cholecystectomy 03/28/18.  - Admitted 12/12/18-12/13/18 after fall while walking over to [facility] just after her cardiology evaluation for preoperative visit for elbow surgery with Dr. [place] Gauze. She sustained a right scalp hematoma, right eye subconjunctival hemorrhage, and depressed fractures of the right lamina papyracea with intact orbital floor. Unclear if event due to syncope versus mechanical fall. CT, carotid Duplex, and echo ordered. Head CT showed no acute intracranial pathology. Carotid US did not show any significant stenosis. Echo was not done (last 07/2017 showed normal LVEF with no significant valvular disease), as she left AMA given her husband who has moderate-advanced dementia was at home alone. According to discharge summary, "patient continues to refuse that she had a true syncopal event and had just tripped and fell". She felt like her leg gave out. She left before seeing ENT, but was evaluated by ophthalmology (see below). Discharge summary also notes that she was noted to have bedbugs and was  again treated for E. Coli UTI.   - On 12/13/18, she was seen by ophthalmologist Jalene Mullet, MD. Recommendations: "Erythromycin ophthalmic ointment to eyelid lacerations BID to TID.  F/u with oculoplastics to determine repair of medial wall fracture.  Entrapment unlikely based on MRI and lack of bradycardia on lateral gaze.  F/u with retina (Dr. Posey Pronto) for dilated exam to evalaute for retinal tear/detachment in 1-2 months after edema resolved."  - ED visit 11/23/18 for fall and sustained a right distal humerus fracture. She did not think she passed out. Also treated for UTI. Set up for out-patient Ortho follow-up.    - On 12/20/18, Dr. [place] Gauze cancelled her 12/26/18 surgery with him and referred her to Dr. Marcelino Scot. Updated cardiology preoperative input as outlined by Roby Lofts, PA-C on 12/20/18: "Angela Bond was last seen on 12/12/2018 by Truitt Merle, NP.  Since that day, DENEAN PAVON has done well from a cardiac standpoint. She can walk >0.5 miles without experiencing chest pain or SOB. No complaints of palpitations. She reports being out of her plavix for the past 4 days due to not being able to afford her prescription, though recently found out that her medications are free through a mail in pharmacy. I sent an updated prescription for plavix today which she was instructed to restart after her surgery when cleared to do so by her orthopedist.  Therefore, based on ACC/AHA guidelines, the patient would be at acceptable risk for the planned procedure without further cardiovascular testing.  Per prior preoperative assessments by Dr. Marlou Porch and no interval change in cardiac history, patient can hold plavix x5 days prior to her upcoming orthopedic surgery."   Discussed with anesthesiologist Hoy Morn, MD. Based on currently  available information, it is anticipated that she can proceed as planned; however, she is a same day work-up, so will need further evaluation on the day of surgery.  Definitive anesthesia plan at that time.    PROVIDERS: Gaynelle Arabian, MD is PCP Candee Furbish, MD is cardiologist Nicholas Lose, MD is HEM-ONC   LABS: She is for updated labs per surgeon orders on the day of surgery. As of 12/13/18, Cr 0.71, glucose 97, H/H 9.7/30.5, PLT 255.     IMAGES: CT head/orbits/c-spine 12/12/18: IMPRESSION: 1.  No acute intracranial pathology. 2. There is high attenuation subconjunctival hemorrhage of the right globe and exophthalmus (series 4, image 15). There are depressed fractures of the right lamina papyracea with fat and medial rectus herniation (series 14, image 30). The orbital floor is intact. 3.  Right frontal scalp and preseptal hematoma. 4.  No fracture or static subluxation of the cervical spine.  1V PCXR 08/15/18: FINDINGS: - Cardiac silhouette is normal size. No mediastinal masses. There is prominence of the right hilum which appears to be vascular and is stable from multiple prior studies a back several years. No convincing hilar masses or enlarged lymph nodes. - Mild scarring in the upper lobes, right greater than left, stable. Lungs otherwise clear. No pleural effusion or pneumothorax. - Skeletal structures are grossly intact. There stable changes from prior right breast surgery. IMPRESSION: No active disease.   EKG: 12/12/18: Sinus rhythm No acute changes No significant change since last tracing Confirmed by Varney Biles [phone]) on 12/12/2018 6:34:39 PM   CV: Carotid US 12/13/18: Summary: Right Carotid: Velocities in the right ICA are consistent with a 1-39% stenosis. Left Carotid: Velocities in the left ICA are consistent with a 1-39% stenosis. Vertebrals: Left vertebral artery demonstrates antegrade flow. Right vertebral             artery was not visualized.   Cardiac cath 08/01/17: Conclusion:  Mid RCA lesion is 25% stenosed.  Previously placed Prox LAD stent, DES,is widely patent.  Ost 1st Diag lesion is 75%  stenosed. The ostium is jailed by the stent. THis is a small vessel.  Mid LAD lesion is 25% stenosed.  The left ventricular systolic function is normal.  LV end diastolic pressure is mildly elevated.  The left ventricular ejection fraction is 55-65% by visual estimate.  There is no aortic valve stenosis. Continue medical therapy.    Echo 08/01/17: Study Conclusions - Left ventricle: The cavity size was normal. Systolic function was   normal. The estimated ejection fraction was in the range of 55%   to 60%. Wall motion was normal; there were no regional wall   motion abnormalities. Left ventricular diastolic function   parameters were normal. - Pulmonary arteries: PA peak pressure: 32 mm Hg (S). (Comparison EF: 55-60% 08/12/12; 35-40% in setting of ACS, s/p DES LAD 11/05/15; 55-60% 01/16/16)    Past Medical History:  Diagnosis Date  . Anemia    hx  . Anxiety and depression   . Aortic atherosclerosis ([place]) 07/31/2017  . Arthritis    "~ all over my body; in the joints"  . Breast cancer, right ([place]) 3/16  . Coronary artery disease    a.  s/p NSTEMI in June 2017 with DES to mid LAD.  Marland Kitchen Depression   . Gallstones   . GERD (gastroesophageal reflux disease)   . H/O hiatal hernia   . Headache   . Heart murmur   . History of blood transfusion    "when taking  chemo & w/one of my knee replacements" (11/05/2015)  . Hypercholesteremia   . Hypertension   . Iron deficiency anemia 02/16/2017  . Multiple bruises   . Myocardial infarction ([place])   . Orthostatic hypotension   . Personal history of chemotherapy   . Personal history of radiation therapy   . Pneumonia X 1  . Shingles 5/15   hx  . Sliding hiatal hernia s/p lap PEH repair 07/31/2012  . Unstable angina ([place]) 01/23/2012  . Unsteady gait    " I fall easily"  . Wears dentures    partial  . Wears glasses     Past Surgical History:  Procedure Laterality Date  . BREAST BIOPSY Right   . BREAST LUMPECTOMY Right 08/2014  .  BREAST LUMPECTOMY WITH SENTINEL LYMPH NODE BIOPSY Right 09/22/2014   Procedure: RIGHT BREAST LUMPECTOMY WITH NEEDLE LOCALIZATION AND RIGHT AXILLARY SENTINEL LYMPH NODE BIOPSY;  Surgeon: [name], MD;  Location: [place];  Service: General;  Laterality: Right;  . CARDIAC CATHETERIZATION    . CARDIAC CATHETERIZATION N/A 11/05/2015   Procedure: Left Heart Cath and Coronary Angiography;  Surgeon: Belva Crome, MD;  Location: [place] [facility];  Service: Cardiovascular;  Laterality: N/A;  . CARDIAC CATHETERIZATION N/A 11/05/2015   Procedure: Coronary Stent Intervention;  Surgeon: Belva Crome, MD;  Location: [place] [facility];  Service: Cardiovascular;  Laterality: N/A;  . CARPAL TUNNEL RELEASE Right 09/2015  . CHOLECYSTECTOMY N/A 03/28/2018   Procedure: LAPAROSCOPIC CHOLECYSTECTOMY WITH INTRAOPERATIVE CHOLANGIOGRAM ERAS PATHWAY;  Surgeon: Jovita Kussmaul, MD;  Location: [place];  Service: General;  Laterality: N/A;  . CORONARY ANGIOPLASTY WITH STENT PLACEMENT  11/05/2015   "1 stent"  . ESOPHAGEAL MANOMETRY N/A 07/02/2012   Procedure: ESOPHAGEAL MANOMETRY (EM);  Surgeon: Lear Ng, MD;  Location: [facility];  Service: Endoscopy;  Laterality: N/A;  schooler to read  . EYE SURGERY Left 2013   tube placed tear duct  . HIATAL HERNIA REPAIR N/A 08/10/2012   Procedure: LAPAROSCOPIC REPAIR OF HIATAL HERNIA WITH MESH AND EGD WITH PEG TUBE PLACEMENT;  Surgeon: Ralene Ok, MD;  Location: [facility];  Service: General;  Laterality: N/A;  . LEFT HEART CATH AND CORONARY ANGIOGRAPHY N/A 08/01/2017   Procedure: LEFT HEART CATH AND CORONARY ANGIOGRAPHY;  Surgeon: Jettie Booze, MD;  Location: [place] [facility];  Service: Cardiovascular;  Laterality: N/A;  . PORT-A-CATH REMOVAL  06/2015  . PORTACATH PLACEMENT Left 09/22/2014   Procedure: INSERTION PORT-A-CATH;  Surgeon: [name], MD;  Location: [place];  Service: General;  Laterality: Left;  . POSTERIOR LUMBAR FUSION  2016   "had 2 vertebrae fused"   . TONSILLECTOMY  as child  . TOTAL KNEE ARTHROPLASTY Bilateral 2011,2012    MEDICATIONS: . [START ON 12/25/2018] vancomycin (VANCOCIN) IVPB 1000 mg/200 mL premix   . acetaminophen (TYLENOL) 500 MG tablet  . aspirin EC 81 MG EC tablet  . cephALEXin (KEFLEX) 500 MG capsule  . Cholecalciferol (VITAMIN D) 2000 UNITS tablet  . clopidogrel (PLAVIX) 75 MG tablet  . dexlansoprazole (DEXILANT) 60 MG capsule  . erythromycin ophthalmic ointment  . escitalopram (LEXAPRO) 20 MG tablet  . ezetimibe (ZETIA) 10 MG tablet  . famotidine (PEPCID) 20 MG tablet  . fexofenadine (ALLEGRA) 180 MG tablet  . furosemide (LASIX) 20 MG tablet  . gabapentin (NEURONTIN) 600 MG tablet  . HYDROcodone-acetaminophen (NORCO/VICODIN) 5-325 MG tablet  . LORazepam (ATIVAN) 1 MG tablet  . lovastatin (MEVACOR) 40 MG tablet  .  Magnesium 500 MG TABS  . meclizine (ANTIVERT) 25 MG tablet  . montelukast (SINGULAIR) 10 MG tablet  . nitroGLYCERIN (NITROSTAT) 0.4 MG SL tablet  . nystatin cream (MYCOSTATIN)  . ondansetron (ZOFRAN) 4 MG tablet  . Polyvinyl Alcohol-Povidone PF (REFRESH) 1.4-0.6 % SOLN  . potassium chloride (K-DUR) 10 MEQ tablet  . vitamin C (ASCORBIC ACID) 500 MG tablet     Myra Gianotti, PA-C Surgical Short Stay/Anesthesiology [facility] Phone [phone] [facility] Phone [phone] 12/24/2018 2:30 PM

## 2018-12-25 ENCOUNTER — Ambulatory Visit (HOSPITAL_COMMUNITY): Payer: Medicare Other | Admitting: Vascular Surgery

## 2018-12-25 ENCOUNTER — Observation Stay (HOSPITAL_COMMUNITY)
Admission: RE | Admit: 2018-12-25 | Discharge: 2018-12-26 | Disposition: A | Discharge status: Home / Self Care | Payer: Medicare Other | Attending: Orthopedic Surgery | Admitting: Orthopedic Surgery

## 2018-12-25 ENCOUNTER — Encounter (HOSPITAL_COMMUNITY)
Admission: RE | Disposition: A | Discharge status: Home / Self Care | Payer: Self-pay | Source: Home / Self Care | Attending: Orthopedic Surgery

## 2018-12-25 ENCOUNTER — Other Ambulatory Visit: Payer: Self-pay

## 2018-12-25 ENCOUNTER — Observation Stay (HOSPITAL_COMMUNITY): Payer: Medicare Other

## 2018-12-25 ENCOUNTER — Encounter (HOSPITAL_COMMUNITY): Payer: Self-pay | Admitting: *Deleted

## 2018-12-25 ENCOUNTER — Ambulatory Visit (HOSPITAL_COMMUNITY): Payer: Medicare Other

## 2018-12-25 DIAGNOSIS — I2510 Atherosclerotic heart disease of native coronary artery without angina pectoris: Secondary | ICD-10-CM | POA: Diagnosis present

## 2018-12-25 DIAGNOSIS — Z882 Allergy status to sulfonamides status: Secondary | ICD-10-CM | POA: Diagnosis not present

## 2018-12-25 DIAGNOSIS — T148XXA Other injury of unspecified body region, initial encounter: Secondary | ICD-10-CM | POA: Insufficient documentation

## 2018-12-25 DIAGNOSIS — Z881 Allergy status to other antibiotic agents status: Secondary | ICD-10-CM | POA: Diagnosis not present

## 2018-12-25 DIAGNOSIS — Z91013 Allergy to seafood: Secondary | ICD-10-CM | POA: Insufficient documentation

## 2018-12-25 DIAGNOSIS — Z96653 Presence of artificial knee joint, bilateral: Secondary | ICD-10-CM | POA: Insufficient documentation

## 2018-12-25 DIAGNOSIS — Z886 Allergy status to analgesic agent status: Secondary | ICD-10-CM | POA: Diagnosis not present

## 2018-12-25 DIAGNOSIS — I480 Paroxysmal atrial fibrillation: Secondary | ICD-10-CM | POA: Diagnosis present

## 2018-12-25 DIAGNOSIS — Z6830 Body mass index (BMI) 30.0-30.9, adult: Secondary | ICD-10-CM | POA: Insufficient documentation

## 2018-12-25 DIAGNOSIS — Z79899 Other long term (current) drug therapy: Secondary | ICD-10-CM | POA: Insufficient documentation

## 2018-12-25 DIAGNOSIS — Z888 Allergy status to other drugs, medicaments and biological substances status: Secondary | ICD-10-CM | POA: Diagnosis not present

## 2018-12-25 DIAGNOSIS — S42401A Unspecified fracture of lower end of right humerus, initial encounter for closed fracture: Secondary | ICD-10-CM | POA: Diagnosis present

## 2018-12-25 DIAGNOSIS — I10 Essential (primary) hypertension: Secondary | ICD-10-CM | POA: Diagnosis not present

## 2018-12-25 DIAGNOSIS — W57XXXA Bitten or stung by nonvenomous insect and other nonvenomous arthropods, initial encounter: Secondary | ICD-10-CM | POA: Diagnosis present

## 2018-12-25 DIAGNOSIS — S42471A Displaced transcondylar fracture of right humerus, initial encounter for closed fracture: Principal | ICD-10-CM | POA: Insufficient documentation

## 2018-12-25 DIAGNOSIS — Z8249 Family history of ischemic heart disease and other diseases of the circulatory system: Secondary | ICD-10-CM | POA: Insufficient documentation

## 2018-12-25 DIAGNOSIS — Z7902 Long term (current) use of antithrombotics/antiplatelets: Secondary | ICD-10-CM | POA: Diagnosis not present

## 2018-12-25 DIAGNOSIS — F419 Anxiety disorder, unspecified: Secondary | ICD-10-CM | POA: Diagnosis present

## 2018-12-25 DIAGNOSIS — Z419 Encounter for procedure for purposes other than remedying health state, unspecified: Secondary | ICD-10-CM

## 2018-12-25 DIAGNOSIS — W19XXXA Unspecified fall, initial encounter: Secondary | ICD-10-CM | POA: Insufficient documentation

## 2018-12-25 DIAGNOSIS — I700 Atherosclerosis of aorta: Secondary | ICD-10-CM | POA: Diagnosis not present

## 2018-12-25 DIAGNOSIS — Z87892 Personal history of anaphylaxis: Secondary | ICD-10-CM | POA: Diagnosis not present

## 2018-12-25 DIAGNOSIS — M1990 Unspecified osteoarthritis, unspecified site: Secondary | ICD-10-CM | POA: Diagnosis not present

## 2018-12-25 DIAGNOSIS — E7800 Pure hypercholesterolemia, unspecified: Secondary | ICD-10-CM | POA: Diagnosis not present

## 2018-12-25 DIAGNOSIS — Z91040 Latex allergy status: Secondary | ICD-10-CM | POA: Diagnosis not present

## 2018-12-25 DIAGNOSIS — I252 Old myocardial infarction: Secondary | ICD-10-CM | POA: Diagnosis not present

## 2018-12-25 DIAGNOSIS — Z853 Personal history of malignant neoplasm of breast: Secondary | ICD-10-CM | POA: Diagnosis not present

## 2018-12-25 DIAGNOSIS — F329 Major depressive disorder, single episode, unspecified: Secondary | ICD-10-CM | POA: Insufficient documentation

## 2018-12-25 DIAGNOSIS — K219 Gastro-esophageal reflux disease without esophagitis: Secondary | ICD-10-CM | POA: Diagnosis present

## 2018-12-25 DIAGNOSIS — Z955 Presence of coronary angioplasty implant and graft: Secondary | ICD-10-CM | POA: Insufficient documentation

## 2018-12-25 DIAGNOSIS — Z7982 Long term (current) use of aspirin: Secondary | ICD-10-CM | POA: Diagnosis not present

## 2018-12-25 DIAGNOSIS — F32A Depression, unspecified: Secondary | ICD-10-CM | POA: Diagnosis present

## 2018-12-25 DIAGNOSIS — Z880 Allergy status to penicillin: Secondary | ICD-10-CM | POA: Diagnosis not present

## 2018-12-25 DIAGNOSIS — E669 Obesity, unspecified: Secondary | ICD-10-CM | POA: Diagnosis present

## 2018-12-25 HISTORY — PX: ORIF HUMERUS FRACTURE: SHX2126

## 2018-12-25 HISTORY — PX: ORIF ELBOW FRACTURE: SUR928

## 2018-12-25 LAB — TYPE AND SCREEN
ABO/RH(D): A NEG
Antibody Screen: NEGATIVE

## 2018-12-25 LAB — APTT: aPTT: 27 seconds (ref 24–36)

## 2018-12-25 LAB — SURGICAL PCR SCREEN
MRSA, PCR: POSITIVE — AB
Staphylococcus aureus: POSITIVE — AB

## 2018-12-25 LAB — COMPREHENSIVE METABOLIC PANEL
ALT: 12 U/L (ref 0–44)
AST: 21 U/L (ref 15–41)
Albumin: 3.4 g/dL — ABNORMAL LOW (ref 3.5–5.0)
Alkaline Phosphatase: 92 U/L (ref 38–126)
Anion gap: 13 (ref 5–15)
BUN: 7 mg/dL — ABNORMAL LOW (ref 8–23)
CO2: 24 mmol/L (ref 22–32)
Calcium: 9 mg/dL (ref 8.9–10.3)
Chloride: 103 mmol/L (ref 98–111)
Creatinine, Ser: 0.78 mg/dL (ref 0.44–1.00)
GFR calc Af Amer: >60 mL/min (ref >60)
GFR calc non Af Amer: >60 mL/min (ref >60)
Glucose, Bld: 88 mg/dL (ref 70–99)
Potassium: 4.3 mmol/L (ref 3.5–5.1)
Sodium: 140 mmol/L (ref 135–145)
Total Bilirubin: 1 mg/dL (ref 0.3–1.2)
Total Protein: 7.6 g/dL (ref 6.5–8.1)

## 2018-12-25 LAB — CBC WITH DIFFERENTIAL/PLATELET
Abs Immature Granulocytes: 0.02 10*3/uL (ref 0.00–0.07)
Basophils Absolute: 0 10*3/uL (ref 0.0–0.1)
Basophils Relative: 0 %
Eosinophils Absolute: 0.4 10*3/uL (ref 0.0–0.5)
Eosinophils Relative: 5 %
HCT: 38.1 % (ref 36.0–46.0)
Hemoglobin: 11.7 g/dL — ABNORMAL LOW (ref 12.0–15.0)
Immature Granulocytes: 0 %
Lymphocytes Relative: 16 %
Lymphs Abs: 1 10*3/uL (ref 0.7–4.0)
MCH: 29.2 pg (ref 26.0–34.0)
MCHC: 30.7 g/dL (ref 30.0–36.0)
MCV: 95 fL (ref 80.0–100.0)
Monocytes Absolute: 0.5 10*3/uL (ref 0.1–1.0)
Monocytes Relative: 8 %
Neutro Abs: 4.5 10*3/uL (ref 1.7–7.7)
Neutrophils Relative %: 71 %
Platelets: 204 10*3/uL (ref 150–400)
RBC: 4.01 MIL/uL (ref 3.87–5.11)
RDW: 13.8 % (ref 11.5–15.5)
WBC: 6.4 10*3/uL (ref 4.0–10.5)
nRBC: 0 % (ref 0.0–0.2)

## 2018-12-25 LAB — PROTIME-INR
INR: 1.1 (ref 0.8–1.2)
Prothrombin Time: 13.9 seconds (ref 11.4–15.2)

## 2018-12-25 SURGERY — OPEN REDUCTION INTERNAL FIXATION (ORIF) DISTAL HUMERUS FRACTURE
Anesthesia: General | Site: Elbow | Laterality: Right

## 2018-12-25 MED ORDER — METOCLOPRAMIDE HCL 5 MG/ML IJ SOLN: 5.0000 mg | Freq: Three times a day (TID) | INTRAMUSCULAR | Status: DC | PRN

## 2018-12-25 MED ORDER — LIDOCAINE 2% (20 MG/ML) 5 ML SYRINGE
INTRAMUSCULAR | Status: DC | PRN
  Administered 2018-12-25: 50 mg via INTRAVENOUS

## 2018-12-25 MED ORDER — ACETAMINOPHEN 500 MG PO TABS
500.0000 mg | ORAL_TABLET | Freq: Four times a day (QID) | ORAL | Status: DC
  Administered 2018-12-25 – 2018-12-26 (×3): 500 mg via ORAL
  Filled 2018-12-25 (×3): qty 1

## 2018-12-25 MED ORDER — ACETAMINOPHEN 325 MG PO TABS: 325.0000 mg | ORAL_TABLET | Freq: Four times a day (QID) | ORAL | Status: DC | PRN

## 2018-12-25 MED ORDER — EPHEDRINE SULFATE 50 MG/ML IJ SOLN
INTRAMUSCULAR | Status: DC | PRN
  Administered 2018-12-25: 10 mg via INTRAVENOUS

## 2018-12-25 MED ORDER — LACTATED RINGERS IV SOLN
INTRAVENOUS | Status: DC
  Administered 2018-12-25 (×2): via INTRAVENOUS

## 2018-12-25 MED ORDER — POTASSIUM CHLORIDE IN NACL 20-0.9 MEQ/L-% IV SOLN
INTRAVENOUS | Status: DC
  Administered 2018-12-25: 18:00:00 via INTRAVENOUS
  Filled 2018-12-25 (×2): qty 1000

## 2018-12-25 MED ORDER — HYDROCODONE-ACETAMINOPHEN 7.5-325 MG PO TABS: 1.0000 | ORAL_TABLET | ORAL | Status: DC | PRN

## 2018-12-25 MED ORDER — CLOPIDOGREL BISULFATE 75 MG PO TABS
75.0000 mg | ORAL_TABLET | Freq: Every day | ORAL | Status: DC
  Administered 2018-12-26: 75 mg via ORAL
  Filled 2018-12-25: qty 1

## 2018-12-25 MED ORDER — 0.9 % SODIUM CHLORIDE (POUR BTL) OPTIME
TOPICAL | Status: DC | PRN
  Administered 2018-12-25: 1000 mL

## 2018-12-25 MED ORDER — FENTANYL CITRATE (PF) 100 MCG/2ML IJ SOLN
INTRAMUSCULAR | Status: AC
  Filled 2018-12-25: qty 2

## 2018-12-25 MED ORDER — DEXAMETHASONE SODIUM PHOSPHATE 10 MG/ML IJ SOLN
INTRAMUSCULAR | Status: DC | PRN
  Administered 2018-12-25: 10 mg via INTRAVENOUS

## 2018-12-25 MED ORDER — CHLORHEXIDINE GLUCONATE 4 % EX LIQD: 60.0000 mL | Freq: Once | CUTANEOUS | Status: DC

## 2018-12-25 MED ORDER — SODIUM CHLORIDE 0.9 % IV SOLN
INTRAVENOUS | Status: DC | PRN
  Administered 2018-12-25: 15 ug/min via INTRAVENOUS

## 2018-12-25 MED ORDER — DOCUSATE SODIUM 100 MG PO CAPS
100.0000 mg | ORAL_CAPSULE | Freq: Two times a day (BID) | ORAL | Status: DC
  Administered 2018-12-25 – 2018-12-26 (×2): 100 mg via ORAL
  Filled 2018-12-25 (×2): qty 1

## 2018-12-25 MED ORDER — ONDANSETRON HCL 4 MG PO TABS: 4.0000 mg | ORAL_TABLET | Freq: Four times a day (QID) | ORAL | Status: DC | PRN

## 2018-12-25 MED ORDER — FENTANYL CITRATE (PF) 100 MCG/2ML IJ SOLN
100.0000 ug | Freq: Once | INTRAMUSCULAR | Status: AC
  Administered 2018-12-25: 100 ug via INTRAVENOUS

## 2018-12-25 MED ORDER — MIDAZOLAM HCL 2 MG/2ML IJ SOLN: 1.0000 mg | Freq: Once | INTRAMUSCULAR | Status: DC

## 2018-12-25 MED ORDER — ONDANSETRON HCL 4 MG/2ML IJ SOLN: 4.0000 mg | Freq: Four times a day (QID) | INTRAMUSCULAR | Status: DC | PRN

## 2018-12-25 MED ORDER — MUPIROCIN 2 % EX OINT
TOPICAL_OINTMENT | Freq: Once | CUTANEOUS | Status: AC
  Administered 2018-12-25: 08:00:00 via NASAL
  Filled 2018-12-25: qty 22

## 2018-12-25 MED ORDER — HYDROCODONE-ACETAMINOPHEN 5-325 MG PO TABS
1.0000 | ORAL_TABLET | ORAL | Status: DC | PRN
  Administered 2018-12-25 (×2): 1 via ORAL
  Administered 2018-12-26: 2 via ORAL
  Administered 2018-12-26: 1 via ORAL
  Filled 2018-12-25: qty 1
  Filled 2018-12-25: qty 2
  Filled 2018-12-25 (×2): qty 1

## 2018-12-25 MED ORDER — ACETAMINOPHEN 500 MG PO TABS: 1000.0000 mg | ORAL_TABLET | Freq: Four times a day (QID) | ORAL | Status: DC | PRN

## 2018-12-25 MED ORDER — VITAMIN D3 25 MCG (1000 UNIT) PO TABS
2000.0000 [IU] | ORAL_TABLET | Freq: Two times a day (BID) | ORAL | Status: DC
  Administered 2018-12-25 – 2018-12-26 (×2): 2000 [IU] via ORAL
  Filled 2018-12-25 (×5): qty 2

## 2018-12-25 MED ORDER — CLINDAMYCIN PHOSPHATE 600 MG/50ML IV SOLN
600.0000 mg | Freq: Four times a day (QID) | INTRAVENOUS | Status: AC
  Administered 2018-12-25 – 2018-12-26 (×3): 600 mg via INTRAVENOUS
  Filled 2018-12-25 (×3): qty 50

## 2018-12-25 MED ORDER — METOCLOPRAMIDE HCL 5 MG PO TABS: 5.0000 mg | ORAL_TABLET | Freq: Three times a day (TID) | ORAL | Status: DC | PRN

## 2018-12-25 MED ORDER — ONDANSETRON HCL 4 MG/2ML IJ SOLN: 4.0000 mg | Freq: Once | INTRAMUSCULAR | Status: DC | PRN

## 2018-12-25 MED ORDER — MIDAZOLAM HCL 2 MG/2ML IJ SOLN
INTRAMUSCULAR | Status: AC
  Administered 2018-12-25: 1 mg
  Filled 2018-12-25: qty 2

## 2018-12-25 MED ORDER — ASPIRIN EC 81 MG PO TBEC
81.0000 mg | DELAYED_RELEASE_TABLET | Freq: Every day | ORAL | Status: DC
  Administered 2018-12-25 – 2018-12-26 (×2): 81 mg via ORAL
  Filled 2018-12-25 (×3): qty 1

## 2018-12-25 MED ORDER — FENTANYL CITRATE (PF) 100 MCG/2ML IJ SOLN: 25.0000 ug | INTRAMUSCULAR | Status: DC | PRN

## 2018-12-25 MED ORDER — ROPIVACAINE HCL 7.5 MG/ML IJ SOLN
INTRAMUSCULAR | Status: DC | PRN
  Administered 2018-12-25: 20 mL via PERINEURAL

## 2018-12-25 MED ORDER — PANTOPRAZOLE SODIUM 40 MG PO TBEC
40.0000 mg | DELAYED_RELEASE_TABLET | Freq: Every day | ORAL | Status: DC
  Administered 2018-12-26: 40 mg via ORAL
  Filled 2018-12-25: qty 1

## 2018-12-25 MED ORDER — MORPHINE SULFATE (PF) 2 MG/ML IV SOLN: 0.5000 mg | INTRAVENOUS | Status: DC | PRN

## 2018-12-25 MED ORDER — OXYCODONE HCL 5 MG/5ML PO SOLN: 5.0000 mg | Freq: Once | ORAL | Status: DC | PRN

## 2018-12-25 MED ORDER — GABAPENTIN 600 MG PO TABS
600.0000 mg | ORAL_TABLET | Freq: Two times a day (BID) | ORAL | Status: DC
  Administered 2018-12-25 – 2018-12-26 (×2): 600 mg via ORAL
  Filled 2018-12-25 (×3): qty 1

## 2018-12-25 MED ORDER — PHENYLEPHRINE HCL (PRESSORS) 10 MG/ML IV SOLN: INTRAVENOUS | Status: DC | PRN

## 2018-12-25 MED ORDER — VITAMIN C 500 MG PO TABS
500.0000 mg | ORAL_TABLET | Freq: Every day | ORAL | Status: DC
  Administered 2018-12-25 – 2018-12-26 (×2): 500 mg via ORAL
  Filled 2018-12-25 (×2): qty 1

## 2018-12-25 MED ORDER — PROPOFOL 10 MG/ML IV BOLUS
INTRAVENOUS | Status: DC | PRN
  Administered 2018-12-25: 130 mg via INTRAVENOUS

## 2018-12-25 MED ORDER — FENTANYL CITRATE (PF) 250 MCG/5ML IJ SOLN
INTRAMUSCULAR | Status: DC | PRN
  Administered 2018-12-25: 25 ug via INTRAVENOUS

## 2018-12-25 MED ORDER — POLYETHYLENE GLYCOL 3350 17 G PO PACK
17.0000 g | PACK | Freq: Every day | ORAL | Status: DC
  Administered 2018-12-25 – 2018-12-26 (×2): 17 g via ORAL
  Filled 2018-12-25 (×2): qty 1

## 2018-12-25 MED ORDER — MONTELUKAST SODIUM 10 MG PO TABS
10.0000 mg | ORAL_TABLET | Freq: Every day | ORAL | Status: DC
  Administered 2018-12-25: 10 mg via ORAL
  Filled 2018-12-25: qty 1

## 2018-12-25 MED ORDER — ONDANSETRON HCL 4 MG/2ML IJ SOLN
INTRAMUSCULAR | Status: DC | PRN
  Administered 2018-12-25: 4 mg via INTRAVENOUS

## 2018-12-25 MED ORDER — OXYCODONE HCL 5 MG PO TABS: 5.0000 mg | ORAL_TABLET | Freq: Once | ORAL | Status: DC | PRN

## 2018-12-25 MED ORDER — ESCITALOPRAM OXALATE 10 MG PO TABS
20.0000 mg | ORAL_TABLET | Freq: Every day | ORAL | Status: DC
  Administered 2018-12-26: 20 mg via ORAL
  Filled 2018-12-25: qty 2

## 2018-12-25 SURGICAL SUPPLY — 76 items
ANCH SUT 1 SHRT SM RGD INSRTR (Anchor) ×1 IMPLANT
ANCHOR SUT 1.45 SZ 1 SHORT (Anchor) ×1 IMPLANT
BIT DRILL MINI LNG ACUTRAK 2 (BIT) IMPLANT
BLADE AVERAGE 25X9 (BLADE) IMPLANT
BNDG CMPR 9X4 STRL LF SNTH (GAUZE/BANDAGES/DRESSINGS)
BNDG COHESIVE 4X5 TAN STRL (GAUZE/BANDAGES/DRESSINGS) ×2 IMPLANT
BNDG ELASTIC 3X5.8 VLCR STR LF (GAUZE/BANDAGES/DRESSINGS) ×1 IMPLANT
BNDG ELASTIC 4X5.8 VLCR STR LF (GAUZE/BANDAGES/DRESSINGS) ×1 IMPLANT
BNDG ESMARK 4X9 LF (GAUZE/BANDAGES/DRESSINGS) IMPLANT
BNDG GAUZE ELAST 4 BULKY (GAUZE/BANDAGES/DRESSINGS) ×4 IMPLANT
BRUSH SCRUB EZ PLAIN DRY (MISCELLANEOUS) ×3 IMPLANT
CORD BIPOLAR FORCEPS 12FT (ELECTRODE) IMPLANT
COVER SURGICAL LIGHT HANDLE (MISCELLANEOUS) ×2 IMPLANT
COVER WAND RF STERILE (DRAPES) ×2 IMPLANT
DRAIN PENROSE 1/4X12 LTX STRL (WOUND CARE) IMPLANT
DRAPE C-ARM 42X72 X-RAY (DRAPES) ×2 IMPLANT
DRAPE C-ARMOR (DRAPES) ×1 IMPLANT
DRAPE HALF SHEET 40X57 (DRAPES) IMPLANT
DRAPE INCISE IOBAN 66X45 STRL (DRAPES) IMPLANT
DRAPE ORTHO SPLIT 77X108 STRL (DRAPES) ×2
DRAPE SURG ORHT 6 SPLT 77X108 (DRAPES) ×1 IMPLANT
DRAPE U-SHAPE 47X51 STRL (DRAPES) ×4 IMPLANT
DRILL MINI LNG ACUTRAK 2 (BIT) ×2
DRSG ADAPTIC 3X8 NADH LF (GAUZE/BANDAGES/DRESSINGS) ×2 IMPLANT
DRSG EMULSION OIL 3X3 NADH (GAUZE/BANDAGES/DRESSINGS) ×1 IMPLANT
DRSG MEPITEL 4X7.2 (GAUZE/BANDAGES/DRESSINGS) ×2 IMPLANT
DRSG PAD ABDOMINAL 8X10 ST (GAUZE/BANDAGES/DRESSINGS) ×2 IMPLANT
ELECT REM PT RETURN 9FT ADLT (ELECTROSURGICAL) ×2
ELECTRODE REM PT RTRN 9FT ADLT (ELECTROSURGICAL) ×1 IMPLANT
EVACUATOR 1/8 PVC DRAIN (DRAIN) IMPLANT
GAUZE SPONGE 4X4 12PLY STRL (GAUZE/BANDAGES/DRESSINGS) ×3 IMPLANT
GLOVE BIO SURGEON STRL SZ7.5 (GLOVE) ×2 IMPLANT
GLOVE BIOGEL PI IND STRL 7.5 (GLOVE) ×1 IMPLANT
GLOVE BIOGEL PI IND STRL 8 (GLOVE) ×1 IMPLANT
GLOVE BIOGEL PI INDICATOR 7.5 (GLOVE) ×1
GLOVE BIOGEL PI INDICATOR 8 (GLOVE) ×1
GLOVE SURG SS PI 7.0 STRL IVOR (GLOVE) ×1 IMPLANT
GOWN STRL REUS W/ TWL LRG LVL3 (GOWN DISPOSABLE) ×1 IMPLANT
GOWN STRL REUS W/ TWL XL LVL3 (GOWN DISPOSABLE) ×2 IMPLANT
GOWN STRL REUS W/TWL LRG LVL3 (GOWN DISPOSABLE) ×2
GOWN STRL REUS W/TWL XL LVL3 (GOWN DISPOSABLE) ×2
GUIDEWIRE ORTHO MICROSHT  ACUT (WIRE) ×3
GUIDEWIRE ORTHO MICROSHT .035 (WIRE) IMPLANT
GUIDEWIRE ORTHO MINI ACTK .045 (WIRE) ×2 IMPLANT
KIT BASIN OR (CUSTOM PROCEDURE TRAY) ×2 IMPLANT
KIT TURNOVER KIT B (KITS) ×2 IMPLANT
MANIFOLD NEPTUNE II (INSTRUMENTS) ×2 IMPLANT
NDL HYPO 25X1 1.5 SAFETY (NEEDLE) IMPLANT
NEEDLE HYPO 25X1 1.5 SAFETY (NEEDLE) IMPLANT
NS IRRIG 1000ML POUR BTL (IV SOLUTION) ×2 IMPLANT
PACK ORTHO EXTREMITY (CUSTOM PROCEDURE TRAY) ×2 IMPLANT
PAD ABD 8X10 STRL (GAUZE/BANDAGES/DRESSINGS) ×1 IMPLANT
PAD ARMBOARD 7.5X6 YLW CONV (MISCELLANEOUS) ×4 IMPLANT
PAD CAST 3X4 CTTN HI CHSV (CAST SUPPLIES) IMPLANT
PAD CAST 4YDX4 CTTN HI CHSV (CAST SUPPLIES) IMPLANT
PADDING CAST COTTON 3X4 STRL (CAST SUPPLIES) ×2
PADDING CAST COTTON 4X4 STRL (CAST SUPPLIES) ×2
SCREW ACUTRAK 2 MINI 16MM (Screw) ×1 IMPLANT
SPONGE LAP 18X18 RF (DISPOSABLE) IMPLANT
STAPLER VISISTAT 35W (STAPLE) ×2 IMPLANT
STOCKINETTE IMPERVIOUS 9X36 MD (GAUZE/BANDAGES/DRESSINGS) IMPLANT
SUCTION FRAZIER HANDLE 10FR (MISCELLANEOUS) ×1
SUCTION TUBE FRAZIER 10FR DISP (MISCELLANEOUS) ×1 IMPLANT
SUT ETHILON 3 0 PS 1 (SUTURE) ×3 IMPLANT
SUT VIC AB 0 CT1 27 (SUTURE) ×2
SUT VIC AB 0 CT1 27XBRD ANBCTR (SUTURE) ×2 IMPLANT
SUT VIC AB 1 CT1 27 (SUTURE) ×2
SUT VIC AB 1 CT1 27XBRD ANBCTR (SUTURE) IMPLANT
SUT VIC AB 2-0 CT1 27 (SUTURE) ×2
SUT VIC AB 2-0 CT1 TAPERPNT 27 (SUTURE) ×2 IMPLANT
SYR 5ML LL (SYRINGE) IMPLANT
SYR CONTROL 10ML LL (SYRINGE) IMPLANT
TOWEL GREEN STERILE (TOWEL DISPOSABLE) ×6 IMPLANT
TOWEL GREEN STERILE FF (TOWEL DISPOSABLE) ×2 IMPLANT
WATER STERILE IRR 1000ML POUR (IV SOLUTION) ×2 IMPLANT
YANKAUER SUCT BULB TIP NO VENT (SUCTIONS) IMPLANT

## 2018-12-25 NOTE — Anesthesia Postprocedure Evaluation (Signed)
Anesthesia Post Note  Patient: Angela Bond  Procedure(s) Performed: OPEN REDUCTION INTERNAL FIXATION (ORIF) DISTAL HUMERUS FRACTURE (Right Elbow)     Patient location during evaluation: PACU Anesthesia Type: General Level of consciousness: awake and alert Pain management: pain level controlled Vital Signs Assessment: post-procedure vital signs reviewed and stable Respiratory status: spontaneous breathing, nonlabored ventilation and respiratory function stable Cardiovascular status: blood pressure returned to baseline and stable Postop Assessment: no apparent nausea or vomiting Anesthetic complications: no    Last Vitals:  Vitals:   12/25/18 1515 12/25/18 1517  BP:  136/68  Pulse: 69 66  Resp: (!) 24 17  Temp:    SpO2: 98% 95%    Last Pain:  Vitals:   12/25/18 1515  TempSrc:   PainSc: 0-No pain                 Audry Pili

## 2018-12-25 NOTE — Anesthesia Procedure Notes (Signed)
Procedure Name: LMA Insertion Date/Time: 12/25/2018 12:21 PM Performed by: Clearnce Sorrel, CRNA Pre-anesthesia Checklist: Patient identified, Emergency Drugs available, Suction available, Patient being monitored and Timeout performed Patient Re-evaluated:Patient Re-evaluated prior to induction Oxygen Delivery Method: Circle system utilized Preoxygenation: Pre-oxygenation with 100% oxygen Induction Type: IV induction LMA: LMA inserted LMA Size: 4.0 Number of attempts: 1 Placement Confirmation: positive ETCO2 and breath sounds checked- equal and bilateral Tube secured with: Tape Dental Injury: Teeth and Oropharynx as per pre-operative assessment

## 2018-12-25 NOTE — Anesthesia Procedure Notes (Signed)
Anesthesia Regional Block: Supraclavicular block   Pre-Anesthetic Checklist: ,, timeout performed, Correct Patient, Correct Site, Correct Laterality, Correct Procedure, Correct Position, site marked, Risks and benefits discussed,  Surgical consent,  Pre-op evaluation,  At surgeon's request and post-op pain management  Laterality: Right  Prep: chloraprep       Needles:  Injection technique: Single-shot  Needle Type: Echogenic Needle     Needle Length: 5cm  Needle Gauge: 21     Additional Needles:   Narrative:  Start time: 12/25/2018 9:09 AM End time: 12/25/2018 9:13 AM Injection made incrementally with aspirations every 5 mL.  Performed by: Personally  Anesthesiologist: Audry Pili, MD  Additional Notes: No pain on injection. No increased resistance to injection. Injection made in 5cc increments. Good needle visualization. Patient tolerated the procedure well.

## 2018-12-25 NOTE — Plan of Care (Signed)

## 2018-12-25 NOTE — Progress Notes (Signed)
Orthopedic Tech Progress Note Patient Details:  Angela Bond June 12, 1945 [id number] Martin Majestic to apply TRAPEZE for patient and she said "I'm leaving tomorrow why are you giving me something I'm not going to use and I only have 1 arm that is working at the moment' with family at bedside. I told her yes ma'am I understand and will notify your nurse, which I did. Patient ID: Angela Bond, female   DOB: September 01, 1945, 73 y.o.   MRN: [id number]   Angela Bond 12/25/2018, 5:14 PM

## 2018-12-25 NOTE — Progress Notes (Signed)
Insect collected off patient's pant and placed in specimen container.  Environmental notified and confirmed insect is a bed bug.  Patient on contact precautions, blankets placed at door.  Dr. Marcelino Scot, OR desk, and PACU aware.  Patient's clothing placed in plastic bag.

## 2018-12-25 NOTE — H&P (Signed)
[facility] H&P/Consult     Patient ID: Angela Bond MRN: [id number] DOB/AGE: 1945-08-12 73 y.o.  Chief Complaint: right intra-articular elbow fracture HPI: Angela Bond is an 73 y.o. female.with h/o bed bugs and fracture about four weeks ago. Fell on day of surgery with Dr. Burney Gauze and consequently referred to me to expedite care. Nerve block in.  Past Medical History:  Diagnosis Date  . Anemia    hx  . Anxiety and depression   . Aortic atherosclerosis ([place]) 07/31/2017  . Arthritis    "~ all over my body; in the joints"  . Breast cancer, right ([place]) 3/16  . Coronary artery disease    a.  s/p NSTEMI in June 2017 with DES to mid LAD.  Marland Kitchen Depression   . Gallstones   . GERD (gastroesophageal reflux disease)   . H/O hiatal hernia   . Headache   . Heart murmur   . History of blood transfusion    "when taking chemo & w/one of my knee replacements" (11/05/2015)  . Hypercholesteremia   . Hypertension   . Iron deficiency anemia 02/16/2017  . Multiple bruises   . Myocardial infarction ([place])   . Orthostatic hypotension   . Personal history of chemotherapy   . Personal history of radiation therapy   . Pneumonia X 1  . Shingles 5/15   hx  . Sliding hiatal hernia s/p lap PEH repair 07/31/2012  . Unstable angina ([place]) 01/23/2012  . Unsteady gait    " I fall easily"  . Wears dentures    partial  . Wears glasses     Past Surgical History:  Procedure Laterality Date  . BREAST BIOPSY Right   . BREAST LUMPECTOMY Right 08/2014  . BREAST LUMPECTOMY WITH SENTINEL LYMPH NODE BIOPSY Right 09/22/2014   Procedure: RIGHT BREAST LUMPECTOMY WITH NEEDLE LOCALIZATION AND RIGHT AXILLARY SENTINEL LYMPH NODE BIOPSY;  Surgeon: [name], MD;  Location: [facility];  Service: General;  Laterality: Right;  . CARDIAC CATHETERIZATION    . CARDIAC CATHETERIZATION N/A 11/05/2015   Procedure: Left Heart Cath and Coronary Angiography;  Surgeon: Belva Crome, MD;   Location: [facility];  Service: Cardiovascular;  Laterality: N/A;  . CARDIAC CATHETERIZATION N/A 11/05/2015   Procedure: Coronary Stent Intervention;  Surgeon: Belva Crome, MD;  Location: [facility];  Service: Cardiovascular;  Laterality: N/A;  . CARPAL TUNNEL RELEASE Right 09/2015  . CHOLECYSTECTOMY N/A 03/28/2018   Procedure: LAPAROSCOPIC CHOLECYSTECTOMY WITH INTRAOPERATIVE CHOLANGIOGRAM ERAS PATHWAY;  Surgeon: Jovita Kussmaul, MD;  Location: [facility];  Service: General;  Laterality: N/A;  . CORONARY ANGIOPLASTY WITH STENT PLACEMENT  11/05/2015   "1 stent"  . ESOPHAGEAL MANOMETRY N/A 07/02/2012   Procedure: ESOPHAGEAL MANOMETRY (EM);  Surgeon: Lear Ng, MD;  Location: [facility];  Service: Endoscopy;  Laterality: N/A;  schooler to read  . EYE SURGERY Left 2013   tube placed tear duct  . HIATAL HERNIA REPAIR N/A 08/10/2012   Procedure: LAPAROSCOPIC REPAIR OF HIATAL HERNIA WITH MESH AND EGD WITH PEG TUBE PLACEMENT;  Surgeon: Ralene Ok, MD;  Location: [facility];  Service: General;  Laterality: N/A;  . LEFT HEART CATH AND CORONARY ANGIOGRAPHY N/A 08/01/2017   Procedure: LEFT HEART CATH AND CORONARY ANGIOGRAPHY;  Surgeon: Jettie Booze, MD;  Location: [facility];  Service: Cardiovascular;  Laterality: N/A;  . PORT-A-CATH REMOVAL  06/2015  . PORTACATH PLACEMENT Left 09/22/2014   Procedure: INSERTION  PORT-A-CATH;  Surgeon: [name], MD;  Location: [facility];  Service: General;  Laterality: Left;  . POSTERIOR LUMBAR FUSION  2016   "had 2 vertebrae fused"  . TONSILLECTOMY  as child  . TOTAL KNEE ARTHROPLASTY Bilateral 2011,2012    Family History  Problem Relation Age of Onset  . Heart disease Mother        36s, CHF  . Heart disease Father        d/o MI at 81  . Cancer Father        skin  . Diabetes Father   . Cancer Sister 26       breast  . Diabetes Sister   . Heart disease Sister        CABG in mid-60s   Social History:  reports that she has  never smoked. She has never used smokeless tobacco. She reports that she does not drink alcohol or use drugs.  Allergies:  Allergies  Allergen Reactions  . Shellfish Allergy Anaphylaxis and Other (See Comments)    ANAPHYLAXIS TO OYSTERS  . Aspirin Other (See Comments)    Burns stomach  . Penicillins Nausea And Vomiting and Other (See Comments)    PATIENT HAS HAD A PCN REACTION WITH IMMEDIATE RASH, FACIAL/TONGUE/THROAT SWELLING, SOB, OR LIGHTHEADEDNESS WITH HYPOTENSION:  #  #  YES  #  #  Has patient had a PCN reaction causing severe rash involving mucus membranes or skin necrosis: No Has patient had a PCN reaction that required hospitalization: No Has patient had a PCN reaction occurring within the last 10 years: No If all of the above answers are "NO", then may proceed with Cephalosporin use.   [name] [Naproxen] Nausea And Vomiting  . Brilinta [Ticagrelor] Other (See Comments)    Indigestion  . Ciprofloxacin Nausea And Vomiting  . Doxycycline Nausea And Vomiting  . Evista [Raloxifene] Other (See Comments)    LEG ACHES  . Fosamax [Alendronate Sodium] Other (See Comments)    Leg aches  . Lactose Intolerance (Gi) Nausea And Vomiting  . Latex Rash  . Naproxen Sodium Nausea And Vomiting  . Nitrofurantoin Rash and Other (See Comments)    "I broke out down there"  . Pravachol [Pravastatin] Other (See Comments)    GI upset   . Sulfa Antibiotics Nausea And Vomiting  . Tape Rash  . Welchol [Colesevelam Hcl] Other (See Comments)    Stomach cramps  . Zocor [Simvastatin] Rash    Medications Prior to Admission  Medication Sig Dispense Refill  . aspirin EC 81 MG EC tablet Take 1 tablet (81 mg total) by mouth daily.    . cephALEXin (KEFLEX) 500 MG capsule Take 500 mg by mouth 2 (two) times daily.    . Cholecalciferol (VITAMIN D) 2000 UNITS tablet Take 2,000 Units by mouth 2 (two) times daily.     . clopidogrel (PLAVIX) 75 MG tablet Take 1 tablet (75 mg total) by mouth daily. 90 tablet 3   . dexlansoprazole (DEXILANT) 60 MG capsule Take 60 mg by mouth daily.    Marland Kitchen erythromycin ophthalmic ointment Place 1 application into the right eye 4 (four) times daily.    Marland Kitchen escitalopram (LEXAPRO) 20 MG tablet Take 20 mg by mouth daily.    Marland Kitchen ezetimibe (ZETIA) 10 MG tablet Take 1 tablet (10 mg total) by mouth at bedtime. 90 tablet 3  . famotidine (PEPCID) 20 MG tablet Take 20 mg by mouth at bedtime.     . fexofenadine (ALLEGRA) 180  MG tablet Take 180 mg by mouth daily as needed for allergies.     . furosemide (LASIX) 20 MG tablet Take one tablet daily as needed for 3 lbs over night weight gain (Patient taking differently: Take 20 mg by mouth every 3 (three) days. ) 30 tablet 3  . gabapentin (NEURONTIN) 600 MG tablet Take 1 tablet (600 mg total) by mouth 3 (three) times daily. (Patient taking differently: Take 600 mg by mouth 2 (two) times daily. ) 90 tablet 0  . HYDROcodone-acetaminophen (NORCO/VICODIN) 5-325 MG tablet Take 1 tablet by mouth every 4 (four) hours as needed for moderate pain. 20 tablet 0  . LORazepam (ATIVAN) 1 MG tablet Take 1 mg by mouth at bedtime.     . lovastatin (MEVACOR) 40 MG tablet TAKE 1 TABLET BY MOUTH AT  BEDTIME (Patient taking differently: Take 40 mg by mouth at bedtime. ) 90 tablet 2  . Magnesium 500 MG TABS Take 500 mg by mouth at bedtime.    . meclizine (ANTIVERT) 25 MG tablet Take 25 mg by mouth 3 (three) times daily as needed for dizziness.     . montelukast (SINGULAIR) 10 MG tablet Take 10 mg by mouth at bedtime.    Marland Kitchen nystatin cream (MYCOSTATIN) Apply 1 application topically 2 (two) times daily. (Patient taking differently: Apply 1 application topically daily. ) 30 g 4  . ondansetron (ZOFRAN) 4 MG tablet Take 1 tablet (4 mg total) by mouth every 6 (six) hours as needed for nausea or vomiting. 20 tablet 0  . Polyvinyl Alcohol-Povidone PF (REFRESH) 1.4-0.6 % SOLN Place 2 drops into both eyes 3 (three) times daily as needed (for dry eyes).    . potassium chloride  (K-DUR) 10 MEQ tablet Take 10 mEq by mouth every 3 (three) days.    . vitamin C (ASCORBIC ACID) 500 MG tablet Take 500 mg by mouth daily.    Marland Kitchen acetaminophen (TYLENOL) 500 MG tablet Take 1,000 mg by mouth every 6 (six) hours as needed for moderate pain or headache.    . nitroGLYCERIN (NITROSTAT) 0.4 MG SL tablet Place 1 tablet (0.4 mg total) under the tongue every 5 (five) minutes as needed for chest pain (CP or SOB). 25 tablet 6    Results for orders placed or performed during the hospital encounter of 12/25/18 (from the past 48 hour(s))  Surgical pcr screen     Status: Abnormal   Collection Time: 12/25/18  6:59 AM   Specimen: Nasal Mucosa; Nasal Swab  Result Value Ref Range   MRSA, PCR POSITIVE (A) NEGATIVE    Comment: RESULT CALLED TO, READ BACK BY AND VERIFIED WITH: Zannie Cove RN 10:00 12/25/18 ([email or web address])    Staphylococcus aureus POSITIVE (A) NEGATIVE    Comment: (NOTE) The Xpert SA Assay (FDA approved for NASAL specimens in patients 49 years of age and older), is one component of a comprehensive surveillance program. It is not intended to diagnose infection nor to guide or monitor treatment. Performed at [facility], [place] [address], [place], [place] [address]   Type and screen Order type and screen if day of surgery is less than 15 days from draw of preadmission visit or order morning of surgery if day of surgery is greater than 6 days from preadmission visit.     Status: None   Collection Time: 12/25/18  8:00 AM  Result Value Ref Range   ABO/RH(D) A NEG    Antibody Screen NEG    Sample Expiration  12/28/2018,2359 Performed at [facility] [address], [place], [place] [address]   CBC WITH DIFFERENTIAL     Status: Abnormal   Collection Time: 12/25/18  8:24 AM  Result Value Ref Range   WBC 6.4 4.0 - 10.5 K/uL   RBC 4.01 3.87 - 5.11 MIL/uL   Hemoglobin 11.7 (L) 12.0 - 15.0 g/dL   HCT 38.1 36.0 - 46.0 %   MCV 95.0 80.0 - 100.0 fL   MCH 29.2 26.0 - 34.0 pg    MCHC 30.7 30.0 - 36.0 g/dL   RDW 13.8 11.5 - 15.5 %   Platelets 204 150 - 400 K/uL   nRBC 0.0 0.0 - 0.2 %   Neutrophils Relative % 71 %   Neutro Abs 4.5 1.7 - 7.7 K/uL   Lymphocytes Relative 16 %   Lymphs Abs 1.0 0.7 - 4.0 K/uL   Monocytes Relative 8 %   Monocytes Absolute 0.5 0.1 - 1.0 K/uL   Eosinophils Relative 5 %   Eosinophils Absolute 0.4 0.0 - 0.5 K/uL   Basophils Relative 0 %   Basophils Absolute 0.0 0.0 - 0.1 K/uL   Immature Granulocytes 0 %   Abs Immature Granulocytes 0.02 0.00 - 0.07 K/uL    Comment: Performed at [facility] [address] [place], [place] [address]  Comprehensive metabolic panel     Status: Abnormal   Collection Time: 12/25/18  8:24 AM  Result Value Ref Range   Sodium 140 135 - 145 mmol/L   Potassium 4.3 3.5 - 5.1 mmol/L   Chloride 103 98 - 111 mmol/L   CO2 24 22 - 32 mmol/L   Glucose, Bld 88 70 - 99 mg/dL   BUN 7 (L) 8 - 23 mg/dL   Creatinine, Ser 0.78 0.44 - 1.00 mg/dL   Calcium 9.0 8.9 - 10.3 mg/dL   Total Protein 7.6 6.5 - 8.1 g/dL   Albumin 3.4 (L) 3.5 - 5.0 g/dL   AST 21 15 - 41 U/L   ALT 12 0 - 44 U/L   Alkaline Phosphatase 92 38 - 126 U/L   Total Bilirubin 1.0 0.3 - 1.2 mg/dL   GFR calc non Af Amer >60 >60 mL/min   GFR calc Af Amer >60 >60 mL/min   Anion gap 13 5 - 15    Comment: Performed at [facility] [address], [place], [place] [address]  Protime-INR     Status: None   Collection Time: 12/25/18  8:24 AM  Result Value Ref Range   Prothrombin Time 13.9 11.4 - 15.2 seconds   INR 1.1 0.8 - 1.2    Comment: (NOTE) INR goal varies based on device and disease states. Performed at [facility], [place] [address], [place], [place] [address]   APTT     Status: None   Collection Time: 12/25/18  8:24 AM  Result Value Ref Range   aPTT 27 24 - 36 seconds    Comment: Performed at [facility] [address], [place], [place] [address]   No results found.  ROS No recent fever, bleeding abnormalities,  urologic dysfunction, GI problems, or weight gain.  Blood pressure (!) 146/67, pulse 70, temperature 98.6 F (37 C), temperature source Oral, resp. rate 15, SpO2 97 %. Physical Exam Ecchymotic face from prior fall [place] RRR No audible wheezing RUEx shoulder, elbow, wrist, digits- no skin wounds, nontender, no instability, no blocks to motion  Sens  Ax/R/M/U absent entirely  post block  Mot   Ax intact;  R/ PIN/ M/ AIN/ U absent post block  Rad 2+   Assessment/Plan Right intra-articular trochlear shear fracture  I discussed with the patient the risks and benefits of surgery, including the possibility of infection, nerve injury, vessel injury, wound breakdown, arthritis, symptomatic hardware, DVT/ PE, loss of motion, malunion, nonunion, and need for further surgery among others.  She acknowledged these risks and wished to proceed.  Insicional and dressing care: Dressings left intact until follow-up Orthopedic device(s): None and Splint Showering: Keep splint dry VTE prophylaxis: N/A  Pain control: Norco Follow - up plan: 2 weeks  Altamese Scotland, MD [facility], [facility] [phone]  12/25/2018, 11:30 AM  [facility] [place] [address] [phone] Domingo Sep (F)

## 2018-12-25 NOTE — Transfer of Care (Signed)
Immediate Anesthesia Transfer of Care Note  Patient: Angela Bond  Procedure(s) Performed: OPEN REDUCTION INTERNAL FIXATION (ORIF) DISTAL HUMERUS FRACTURE (Right Elbow)  Patient Location: PACU  Anesthesia Type:General and Regional  Level of Consciousness: awake, alert  and oriented  Airway & Oxygen Therapy: Patient Spontanous Breathing and Patient connected to nasal cannula oxygen  Post-op Assessment: Report given to RN and Post -op Vital signs reviewed and stable  Post vital signs: Reviewed and stable  Last Vitals:  Vitals Value Taken Time  BP 131/77 12/25/18 1447  Temp    Pulse 64 12/25/18 1449  Resp 17 12/25/18 1449  SpO2 100 % 12/25/18 1449  Vitals shown include unvalidated device data.  Last Pain:  Vitals:   12/25/18 0935  TempSrc:   PainSc: 0-No pain      Patients Stated Pain Goal: 6 (71/21/97 5883)  Complications: No apparent anesthesia complications

## 2018-12-26 ENCOUNTER — Encounter (HOSPITAL_COMMUNITY): Payer: Self-pay | Admitting: General Practice

## 2018-12-26 ENCOUNTER — Ambulatory Visit (HOSPITAL_COMMUNITY): Admission: RE | Admit: 2018-12-26 | Payer: Medicare Other | Source: Home / Self Care | Admitting: Orthopedic Surgery

## 2018-12-26 ENCOUNTER — Encounter (HOSPITAL_COMMUNITY): Admission: RE | Payer: Self-pay | Source: Home / Self Care

## 2018-12-26 DIAGNOSIS — F32A Depression, unspecified: Secondary | ICD-10-CM | POA: Diagnosis present

## 2018-12-26 DIAGNOSIS — S42471A Displaced transcondylar fracture of right humerus, initial encounter for closed fracture: Secondary | ICD-10-CM | POA: Diagnosis not present

## 2018-12-26 DIAGNOSIS — I2510 Atherosclerotic heart disease of native coronary artery without angina pectoris: Secondary | ICD-10-CM | POA: Diagnosis present

## 2018-12-26 DIAGNOSIS — F419 Anxiety disorder, unspecified: Secondary | ICD-10-CM | POA: Diagnosis present

## 2018-12-26 DIAGNOSIS — F329 Major depressive disorder, single episode, unspecified: Secondary | ICD-10-CM | POA: Diagnosis present

## 2018-12-26 SURGERY — OPEN REDUCTION INTERNAL FIXATION (ORIF) DISTAL RADIUS FRACTURE
Anesthesia: Regional | Laterality: Right

## 2018-12-26 MED ORDER — MUPIROCIN 2 % EX OINT
1.0000 "application " | TOPICAL_OINTMENT | Freq: Two times a day (BID) | CUTANEOUS | Status: DC
  Administered 2018-12-26: 1 via NASAL
  Filled 2018-12-26: qty 22

## 2018-12-26 MED ORDER — OXYCODONE-ACETAMINOPHEN 5-325 MG PO TABS
1.0000 | ORAL_TABLET | Freq: Four times a day (QID) | ORAL | Status: DC | PRN
  Administered 2018-12-26: 2 via ORAL
  Filled 2018-12-26: qty 2

## 2018-12-26 MED ORDER — ACETAMINOPHEN 500 MG PO TABS: 500.0000 mg | ORAL_TABLET | Freq: Four times a day (QID) | ORAL | 0 refills | Status: DC | PRN

## 2018-12-26 MED ORDER — DOCUSATE SODIUM 100 MG PO CAPS: 100.0000 mg | ORAL_CAPSULE | Freq: Two times a day (BID) | ORAL | 1 refills | Status: DC

## 2018-12-26 MED ORDER — OXYCODONE-ACETAMINOPHEN 5-325 MG PO TABS: 1.0000 | ORAL_TABLET | Freq: Four times a day (QID) | ORAL | 0 refills | Status: DC | PRN

## 2018-12-26 MED ORDER — MUPIROCIN 2 % EX OINT: 1.0000 "application " | TOPICAL_OINTMENT | Freq: Two times a day (BID) | CUTANEOUS | 0 refills | Status: DC

## 2018-12-26 MED ORDER — CHLORHEXIDINE GLUCONATE CLOTH 2 % EX PADS: 6.0000 | MEDICATED_PAD | Freq: Every day | CUTANEOUS | Status: DC

## 2018-12-26 NOTE — Progress Notes (Signed)
Patient has decided to go home without DME should would like to be called once DME arrives and some one will be sent to pick up.

## 2018-12-26 NOTE — Discharge Instructions (Addendum)
Orthopaedic Trauma Service Discharge Instructions   General Discharge Instructions  WEIGHT BEARING STATUS: Nonweightbearing Left arm   RANGE OF MOTION/ACTIVITY: ok to move fingers. No elbow motion at this time. Do not remove splint. Wear sling for comfort  Bone health:  Continue with vitamin d and vitamin c   Wound Care: do not remove splint. Keep splint clean and dry. Ok to shower as long as splint does not get wet. We will remove splint at your first follow up appointment   DVT/PE prophylaxis: resume home plavix   Diet: as you were eating previously.  Can use over the counter stool softeners and bowel preparations, such as Miralax, to help with bowel movements.  Narcotics can be constipating.  Be sure to drink plenty of fluids  PAIN MEDICATION USE AND EXPECTATIONS  You have likely been given narcotic medications to help control your pain.  After a traumatic event that results in an fracture (broken bone) with or without surgery, it is ok to use narcotic pain medications to help control one's pain.  We understand that everyone responds to pain differently and each individual patient will be evaluated on a regular basis for the continued need for narcotic medications. Ideally, narcotic medication use should last no more than 6-8 weeks (coinciding with fracture healing).   As a patient it is your responsibility as well to monitor narcotic medication use and report the amount and frequency you use these medications when you come to your office visit.   We would also advise that if you are using narcotic medications, you should take a dose prior to therapy to maximize you participation.  IF YOU ARE ON NARCOTIC MEDICATIONS IT IS NOT PERMISSIBLE TO OPERATE A MOTOR VEHICLE (MOTORCYCLE/CAR/TRUCK/MOPED) OR HEAVY MACHINERY DO NOT MIX NARCOTICS WITH OTHER CNS (CENTRAL NERVOUS SYSTEM) DEPRESSANTS SUCH AS ALCOHOL   STOP SMOKING OR USING NICOTINE PRODUCTS!!!!  As discussed nicotine severely impairs  your body's ability to heal surgical and traumatic wounds but also impairs bone healing.  Wounds and bone heal by forming microscopic blood vessels (angiogenesis) and nicotine is a vasoconstrictor (essentially, shrinks blood vessels).  Therefore, if vasoconstriction occurs to these microscopic blood vessels they essentially disappear and are unable to deliver necessary nutrients to the healing tissue.  This is one modifiable factor that you can do to dramatically increase your chances of healing your injury.    (This means no smoking, no nicotine gum, patches, etc)  DO NOT USE NONSTEROIDAL ANTI-INFLAMMATORY DRUGS (NSAID'S)  Using products such as Advil (ibuprofen), Aleve (naproxen), Motrin (ibuprofen) for additional pain control during fracture healing can delay and/or prevent the healing response.  If you would like to take over the counter (OTC) medication, Tylenol (acetaminophen) is ok.  However, some narcotic medications that are given for pain control contain acetaminophen as well. Therefore, you should not exceed more than 4000 mg of tylenol in a day if you do not have liver disease.  Also note that there are may OTC medicines, such as cold medicines and allergy medicines that my contain tylenol as well.  If you have any questions about medications and/or interactions please ask your doctor/PA or your pharmacist.      ICE AND ELEVATE INJURED/OPERATIVE EXTREMITY  Using ice and elevating the injured extremity above your heart can help with swelling and pain control.  Icing in a pulsatile fashion, such as 20 minutes on and 20 minutes off, can be followed.    Do not place ice directly on skin. Make  sure there is a barrier between to skin and the ice pack.    Using frozen items such as frozen peas works well as the conform nicely to the are that needs to be iced.  USE AN ACE WRAP OR TED HOSE FOR SWELLING CONTROL  In addition to icing and elevation, Ace wraps or TED hose are used to help limit and  resolve swelling.  It is recommended to use Ace wraps or TED hose until you are informed to stop.    When using Ace Wraps start the wrapping distally (farthest away from the body) and wrap proximally (closer to the body)   Example: If you had surgery on your leg or thing and you do not have a splint on, start the ace wrap at the toes and work your way up to the thigh        If you had surgery on your upper extremity and do not have a splint on, start the ace wrap at your fingers and work your way up to the upper arm  IF YOU ARE IN A SPLINT OR CAST DO NOT [place]   If your splint gets wet for any reason please contact the office immediately. You may shower in your splint or cast as long as you keep it dry.  This can be done by wrapping in a cast cover or garbage back (or similar)  Do Not stick any thing down your splint or cast such as pencils, money, or hangers to try and scratch yourself with.  If you feel itchy take benadryl as prescribed on the bottle for itching  IF YOU ARE IN A CAM BOOT (BLACK BOOT)  You may remove boot periodically. Perform daily dressing changes as noted below.  Wash the liner of the boot regularly and wear a sock when wearing the boot. It is recommended that you sleep in the boot until told otherwise  CALL THE OFFICE WITH ANY QUESTIONS OR CONCERNS: [phone]

## 2018-12-26 NOTE — Progress Notes (Signed)
Nsg Discharge Note  Admit Date:  12/25/2018 Discharge date: 12/26/2018   HYDEIA MCATEE to be D/C'd home per MD order.  AVS completed.  Patient/caregiver able to verbalize understanding.  Discharge Medication: Allergies as of 12/26/2018      Reactions   Shellfish Allergy Anaphylaxis, Other (See Comments)   ANAPHYLAXIS TO OYSTERS   Aspirin Other (See Comments)   Burns stomach   Penicillins Nausea And Vomiting, Other (See Comments)   PATIENT HAS HAD A PCN REACTION WITH IMMEDIATE RASH, FACIAL/TONGUE/THROAT SWELLING, SOB, OR LIGHTHEADEDNESS WITH HYPOTENSION:  #  #  YES  #  #  Has patient had a PCN reaction causing severe rash involving mucus membranes or skin necrosis: No Has patient had a PCN reaction that required hospitalization: No Has patient had a PCN reaction occurring within the last 10 years: No If all of the above answers are "NO", then may proceed with Cephalosporin use.   Aleve [naproxen] Nausea And Vomiting   Brilinta [ticagrelor] Other (See Comments)   Indigestion   Ciprofloxacin Nausea And Vomiting   Doxycycline Nausea And Vomiting   Evista [raloxifene] Other (See Comments)   LEG ACHES   Fosamax [alendronate Sodium] Other (See Comments)   Leg aches   Lactose Intolerance (gi) Nausea And Vomiting   Latex Rash   Naproxen Sodium Nausea And Vomiting   Nitrofurantoin Rash, Other (See Comments)   "I broke out down there"   Pravachol [pravastatin] Other (See Comments)   GI upset   Sulfa Antibiotics Nausea And Vomiting   Tape Rash   Welchol [colesevelam Hcl] Other (See Comments)   Stomach cramps   Zocor [simvastatin] Rash      Medication List    STOP taking these medications   HYDROcodone-acetaminophen 5-325 MG tablet Commonly known as: NORCO/VICODIN     TAKE these medications   acetaminophen 500 MG tablet Commonly known as: TYLENOL Take 1 tablet (500 mg total) by mouth every 6 (six) hours as needed for moderate pain or headache. What changed: how much to take    aspirin 81 MG EC tablet Take 1 tablet (81 mg total) by mouth daily. Notes to patient: Next dose due 12/27/18   cephALEXin 500 MG capsule Commonly known as: KEFLEX Take 500 mg by mouth 2 (two) times daily. Notes to patient: Next dose due 12/26/18   clopidogrel 75 MG tablet Commonly known as: PLAVIX Take 1 tablet (75 mg total) by mouth daily. Notes to patient: Next dose due 12/27/18   dexlansoprazole 60 MG capsule Commonly known as: DEXILANT Take 60 mg by mouth daily. Notes to patient: Next dose due 12/27/18   docusate sodium 100 MG capsule Commonly known as: COLACE Take 1 capsule (100 mg total) by mouth 2 (two) times daily. Notes to patient: Next dose due 12/26/18   erythromycin ophthalmic ointment Place 1 application into the right eye 4 (four) times daily. Notes to patient: Next dose due 12/26/18   escitalopram 20 MG tablet Commonly known as: LEXAPRO Take 20 mg by mouth daily. Notes to patient: Next dose due 12/27/18   ezetimibe 10 MG tablet Commonly known as: ZETIA Take 1 tablet (10 mg total) by mouth at bedtime. Notes to patient: Next dose due 12/26/18   famotidine 20 MG tablet Commonly known as: PEPCID Take 20 mg by mouth at bedtime. Notes to patient: Next dose due 12/26/18   fexofenadine 180 MG tablet Commonly known as: ALLEGRA Take 180 mg by mouth daily as needed for allergies.   furosemide 20 MG  tablet Commonly known as: LASIX Take one tablet daily as needed for 3 lbs over night weight gain What changed:   how much to take  how to take this  when to take this  additional instructions   gabapentin 600 MG tablet Commonly known as: Neurontin Take 1 tablet (600 mg total) by mouth 3 (three) times daily. What changed: when to take this Notes to patient: Next dose due 12/26/18   LORazepam 1 MG tablet Commonly known as: ATIVAN Take 1 mg by mouth at bedtime. Notes to patient: Next dose due 12/26/18   lovastatin 40 MG tablet Commonly known as: MEVACOR TAKE  1 TABLET BY MOUTH AT  BEDTIME Notes to patient: Next dose due 12/26/18   Magnesium 500 MG Tabs Take 500 mg by mouth at bedtime. Notes to patient: Next dose due 12/26/18   meclizine 25 MG tablet Commonly known as: ANTIVERT Take 25 mg by mouth 3 (three) times daily as needed for dizziness.   montelukast 10 MG tablet Commonly known as: SINGULAIR Take 10 mg by mouth at bedtime. Notes to patient: Next dose due 12/26/18   mupirocin ointment 2 % Commonly known as: BACTROBAN Place 1 application into the nose 2 (two) times daily. Notes to patient: Next dose due 12/26/18   nitroGLYCERIN 0.4 MG SL tablet Commonly known as: NITROSTAT Place 1 tablet (0.4 mg total) under the tongue every 5 (five) minutes as needed for chest pain (CP or SOB).   nystatin cream Commonly known as: MYCOSTATIN Apply 1 application topically 2 (two) times daily. What changed: when to take this Notes to patient: Next dose due 12/26/18   ondansetron 4 MG tablet Commonly known as: ZOFRAN Take 1 tablet (4 mg total) by mouth every 6 (six) hours as needed for nausea or vomiting.   oxyCODONE-acetaminophen 5-325 MG tablet Commonly known as: PERCOCET/ROXICET Take 1-2 tablets by mouth every 6 (six) hours as needed for moderate pain or severe pain.   potassium chloride 10 MEQ tablet Commonly known as: K-DUR Take 10 mEq by mouth every 3 (three) days. Notes to patient: EVERY 3 DAYS   Refresh 1.4-0.6 % Soln Generic drug: Polyvinyl Alcohol-Povidone PF Place 2 drops into both eyes 3 (three) times daily as needed (for dry eyes).   vitamin C 500 MG tablet Commonly known as: ASCORBIC ACID Take 500 mg by mouth daily. Notes to patient: Next dose due 12/27/18   Vitamin D 50 MCG (2000 UT) tablet Take 2,000 Units by mouth 2 (two) times daily. Notes to patient: Next dose due 12/27/18            Durable Medical Equipment  (From admission, onward)         Start     Ordered   12/26/18 1310  For home use only DME 3 n 1   Once     12/26/18 1309           Discharge Care Instructions  (From admission, onward)         Start     Ordered   12/26/18 0000  Non weight bearing    Question Answer Comment  Laterality left   Extremity Upper      12/26/18 1026          Discharge Assessment: Vitals:   12/26/18 0413 12/26/18 0830  BP: 99/61 110/60  Pulse: 60 64  Resp: 16 16  Temp: 97.7 F (36.5 C) 98.3 F (36.8 C)  SpO2: 97% 100%   Skin clean, dry and intact  without evidence of skin break down, no evidence of skin tears noted. IV catheter discontinued intact. Site without signs and symptoms of complications - no redness or edema noted at insertion site, patient denies c/o pain - only slight tenderness at site.  Dressing with slight pressure applied.  D/c Instructions-Education: Discharge instructions given to patient/family with verbalized understanding. D/c education completed with patient/family including follow up instructions, medication list, d/c activities limitations if indicated, with other d/c instructions as indicated by MD - patient able to verbalize understanding, all questions fully answered. Patient instructed to return to ED, call 911, or call MD for any changes in condition.  Patient escorted via [place], and D/C home via private auto.  Bertran Zeimet, Jolene Schimke, RN 12/26/2018 3:41 PM

## 2018-12-26 NOTE — Progress Notes (Signed)
Patient sister has picked up 3n1 bedside commode.

## 2018-12-26 NOTE — Progress Notes (Signed)
Orthopedic Tech Progress Note Patient Details:  Angela Bond November 26, 1945 [id number]  Ortho Devices Type of Ortho Device: Shoulder immobilizer Ortho Device/Splint Location: URE Ortho Device/Splint Interventions: Adjustment, Application, Ordered   Post Interventions Patient Tolerated: Well Instructions Provided: Care of device, Adjustment of device   [name] 12/26/2018, 11:08 AM

## 2018-12-26 NOTE — Discharge Summary (Signed)
Orthopaedic Trauma Service (OTS) Discharge Summary   Patient ID: Angela Bond MRN: [id number] DOB/AGE: Sep 26, 1945 73 y.o.  Admit date: 12/25/2018 Discharge date: 12/26/2018  Admission Diagnoses: Closed right distal humerus fracture Paroxysmal A. Fib Hypertension GERD Obesity Vascular disease Bedbug infestation  Anxiety  CAD   Discharge Diagnoses:  Principal Problem:   Closed fracture of right distal humerus Active Problems:   Paroxysmal atrial fibrillation (HCC)   Hypertension   GERD (gastroesophageal reflux disease)   Arthritis   Obesity (BMI 30-39.9)   Aortic atherosclerosis (HCC)   Bedbug bite   Anxiety and depression   Coronary artery disease   Past Medical History:  Diagnosis Date   Anemia    hx   Anxiety and depression    Aortic atherosclerosis ([place]) 07/31/2017   Arthritis    "~ all over my body; in the joints"   Breast cancer, right ([place]) 3/16   Coronary artery disease    a.  s/p NSTEMI in June 2017 with DES to mid LAD.   Depression    Gallstones    GERD (gastroesophageal reflux disease)    H/O hiatal hernia    Headache    Heart murmur    History of blood transfusion    "when taking chemo & w/one of my knee replacements" (11/05/2015)   Hypercholesteremia    Hypertension    Iron deficiency anemia 02/16/2017   Multiple bruises    Myocardial infarction (HCC)    Orthostatic hypotension    Personal history of chemotherapy    Personal history of radiation therapy    Pneumonia X 1   Shingles 5/15   hx   Sliding hiatal hernia s/p lap PEH repair 07/31/2012   Unstable angina (HCC) 01/23/2012   Unsteady gait    " I fall easily"   Wears dentures    partial   Wears glasses      Procedures Performed: 12/25/2018-Dr. Handy ORIF right distal humerus fracture  Discharged Condition: good  Hospital Course:  73 year old female with history of recurrent bedbug infestation, CAD, paroxysmal A. fib, hypertension who  sustained a fall approximately 4 weeks ago.  She had a right distal humerus fracture she was going to have surgery performed by 1 of the local upper extremity surgeon however she sustained a fall again on the day of that surgery.  In order to expedite her care she was referred to the orthopedic trauma specialist for further management.  Patient was taken to the operating room on 04/05/2019 for the procedure noted above.  Patient tolerated the procedure well.  Of note bedbugs were treated prior to her coming back to the perioperative holding area.  After surgery patient was transferred to the PACU for recovery from anesthesia.  Due to her medical history she was admitted to the orthopedic floor for overnight observation.  Patient did very well overnight without any significant issues.  Pain was well controlled on postoperative day #1 and she was deemed to be stable for discharged to follow-up with home on postoperative day #1.  We did discuss prior to her discharge the importance of her getting the bedbug issue addressed.  States that she will get it addressed prior to coming back to the office.  We discussed that it is significant issue with respect to having her near other patients in the office and she demonstrates understanding and states it will be taken care of.  Patient discharged in stable condition on 12/26/2018.  Consults: None  Significant Diagnostic Studies: labs:  Results for Angela, Bond (MRN [id number]) as of 01/10/2019 08:57  Ref. Range 12/25/2018 08:24  Sodium Latest Ref Range: 135 - 145 mmol/L 140  Potassium Latest Ref Range: 3.5 - 5.1 mmol/L 4.3  Chloride Latest Ref Range: 98 - 111 mmol/L 103  CO2 Latest Ref Range: 22 - 32 mmol/L 24  Glucose Latest Ref Range: 70 - 99 mg/dL 88  BUN Latest Ref Range: 8 - 23 mg/dL 7 (L)  Creatinine Latest Ref Range: 0.44 - 1.00 mg/dL 0.78  Calcium Latest Ref Range: 8.9 - 10.3 mg/dL 9.0  Anion gap Latest Ref Range: 5 - 15  13  Alkaline Phosphatase  Latest Ref Range: 38 - 126 U/L 92  Albumin Latest Ref Range: 3.5 - 5.0 g/dL 3.4 (L)  AST Latest Ref Range: 15 - 41 U/L 21  ALT Latest Ref Range: 0 - 44 U/L 12  Total Protein Latest Ref Range: 6.5 - 8.1 g/dL 7.6  Total Bilirubin Latest Ref Range: 0.3 - 1.2 mg/dL 1.0  GFR, Est Non African American Latest Ref Range: >60 mL/min >60  GFR, Est African American Latest Ref Range: >60 mL/min >60  WBC Latest Ref Range: 4.0 - 10.5 K/uL 6.4  RBC Latest Ref Range: 3.87 - 5.11 MIL/uL 4.01  Hemoglobin Latest Ref Range: 12.0 - 15.0 g/dL 11.7 (L)  HCT Latest Ref Range: 36.0 - 46.0 % 38.1  MCV Latest Ref Range: 80.0 - 100.0 fL 95.0  MCH Latest Ref Range: 26.0 - 34.0 pg 29.2  MCHC Latest Ref Range: 30.0 - 36.0 g/dL 30.7  RDW Latest Ref Range: 11.5 - 15.5 % 13.8  Platelets Latest Ref Range: 150 - 400 K/uL 204  nRBC Latest Ref Range: 0.0 - 0.2 % 0.0  Neutrophils Latest Units: % 71  Lymphocytes Latest Units: % 16  Monocytes Relative Latest Units: % 8  Eosinophil Latest Units: % 5  Basophil Latest Units: % 0  Immature Granulocytes Latest Units: % 0  NEUT# Latest Ref Range: 1.7 - 7.7 K/uL 4.5  Lymphocyte # Latest Ref Range: 0.7 - 4.0 K/uL 1.0  Monocyte # Latest Ref Range: 0.1 - 1.0 K/uL 0.5  Eosinophils Absolute Latest Ref Range: 0.0 - 0.5 K/uL 0.4  Basophils Absolute Latest Ref Range: 0.0 - 0.1 K/uL 0.0  Abs Immature Granulocytes Latest Ref Range: 0.00 - 0.07 K/uL 0.02  Prothrombin Time Latest Ref Range: 11.4 - 15.2 seconds 13.9  INR Latest Ref Range: 0.8 - 1.2  1.1  APTT Latest Ref Range: 24 - 36 seconds 27    Treatments: IV hydration, antibiotics: vancomycin and clindamycin, analgesia: acetaminophen, oxy RI and norco, therapies: PT, OT and RN and surgery: as above  Discharge Exam:  Orthopedic Trauma Service Progress Note   Patient ID: Angela Bond MRN: [id number] DOB/AGE: February 22, 1946 73 y.o.   Subjective:   Doing fine Increased pain R elbow States block wore off yesterday  evening Prefers percocet over norco    Pt lives alone with her husband who has dementia and she is his caregiver. States her neighbor is looking after him today but her neighbor has to leave to day to go to [place]    Would like to go home today   Discussed once again that it is imperative for her to get the bedbug issue resolved    No CP, no SOB No h/a or lightheadedness No N/V No abd pain    ROS As above   Objective:    VITALS:   Vitals:    12/25/18  2030 12/26/18 0100 12/26/18 0413 12/26/18 0830  BP: 94/61 95/61 99/61  110/60  Pulse: 87 65 60 64  Resp: 14 14 16 16   Temp: 98.8 F (37.1 C) 98.6 F (37 C) 97.7 F (36.5 C) 98.3 F (36.8 C)  TempSrc: Oral Oral Oral Oral  SpO2:   94% 97% 100%     Estimated body mass index is 36.03 kg/m as calculated from the following:   Height as of 12/13/18: 4\' 8"  (1.422 m).   Weight as of 12/13/18: 72.9 kg.     Intake/Output      08/11 0701 - 08/12 0700 08/12 0701 - 08/13 0700   P.O. 480    I.V. 1377.3    IV Piggyback 100    Total Intake 1957.3    Urine 1400    Blood 25    Total Output 1425    Net +532.3         Urine Occurrence 1 x       LABS   Lab Results Last 24 Hours  No results found for this or any previous visit (from the past 24 hour(s)).       PHYSICAL EXAM:    Gen: resting comfortably in bed, NAD, appears well  Lungs: unlabored Cardiac: s1 and s2 Ext:       Right Upper Extremity              Splint fitting well                         Clinically looks to be in good position                          Splint c/d/i             Ext warm              Radial, ulnar, median nv sensation intact             R/U/M/AIN/PIN motor intact             Swelling stable                Assessment/Plan: 1 Day Post-Op    Principal Problem:   Closed fracture of right distal humerus Active Problems:   Paroxysmal atrial fibrillation (HCC)   Hypertension   GERD (gastroesophageal reflux disease)   Arthritis   Obesity (BMI  30-39.9)   Aortic atherosclerosis (HCC)   Bedbug bite   Anxiety and depression   Coronary artery disease               Anti-infectives (From admission, onward)      Start     Dose/Rate Route Frequency Ordered Stop    12/25/18 1630   clindamycin (CLEOCIN) IVPB 600 mg     600 mg 100 mL/hr over 30 Minutes Intravenous Every 6 hours 12/25/18 1625 12/26/18 0421    12/25/18 0700   vancomycin (VANCOCIN) IVPB 1000 mg/200 mL premix     1,000 mg 200 mL/hr over 60 Minutes Intravenous To [facility] 12/24/18 1010 12/25/18 1015       .   POD/HD#: 1   73 y/o RHD white female s/p fall approximately 4 weeks ago with R intra-articular trochlear shear distal humerus fracture    - fall   -R intra-articular trochlear shear distal humerus fracture s/p ORIF             Splint  x 2-3 weeks             Ice and elevate             Sling for comfort             Ok to move fingers for swelling and pain control             PT/OT eval   - Pain management:             Low dose percocet             Tylenol   - ABL anemia/Hemodynamics             Stable   - Medical issues              Numerous chronic medical issues             Resume home meds   - DVT/PE prophylaxis:             Resume home plavix             Does not require additional prophylaxis   - ID:              periop abx completed   - Metabolic Bone Disease:             Discuss metabolic bone health at follow up appointment             Fracture suggestive of poor bone quality    - FEN/GI prophylaxis/Foley/Lines:             Reg diet   -Ex-fix/Splint care:             Keep splint clean and dry                          Ok to shower as long as splint is protected from water    - Impediments to fracture healing:             Poor bone quality              Chronic medical issues             Chronic medications (lasix, lexapro, lorazepam)   - Dispo:             Dc home today              Follow up with ortho in 2-3  weeks                          Needs to address bedbug issues at home      Disposition: Discharge disposition: 01-Home or Self Care       Discharge Instructions    Call MD / Call 911   Complete by: As directed    If you experience chest pain or shortness of breath, CALL 911 and be transported to the hospital emergency room.  If you develope a fever above 101 F, pus (white drainage) or increased drainage or redness at the wound, or calf pain, call your surgeon's office.   Constipation Prevention   Complete by: As directed    Drink plenty of fluids.  Prune juice may be helpful.  You may use a stool softener, such as Colace (over the counter) 100 mg twice a day.  Use MiraLax (over the counter) for constipation as needed.   Diet -  low sodium heart healthy   Complete by: As directed    Discharge instructions   Complete by: As directed    Orthopaedic Trauma Service Discharge Instructions   General Discharge Instructions  WEIGHT BEARING STATUS: Nonweightbearing Left arm   RANGE OF MOTION/ACTIVITY: ok to move fingers. No elbow motion at this time. Do not remove splint. Wear sling for comfort  Bone health:  Continue with vitamin d and vitamin c   Wound Care: do not remove splint. Keep splint clean and dry. Ok to shower as long as splint does not get wet. We will remove splint at your first follow up appointment   DVT/PE prophylaxis: resume home plavix   Diet: as you were eating previously.  Can use over the counter stool softeners and bowel preparations, such as Miralax, to help with bowel movements.  Narcotics can be constipating.  Be sure to drink plenty of fluids  PAIN MEDICATION USE AND EXPECTATIONS  You have likely been given narcotic medications to help control your pain.  After a traumatic event that results in an fracture (broken bone) with or without surgery, it is ok to use narcotic pain medications to help control one's pain.  We understand that everyone responds to pain  differently and each individual patient will be evaluated on a regular basis for the continued need for narcotic medications. Ideally, narcotic medication use should last no more than 6-8 weeks (coinciding with fracture healing).   As a patient it is your responsibility as well to monitor narcotic medication use and report the amount and frequency you use these medications when you come to your office visit.   We would also advise that if you are using narcotic medications, you should take a dose prior to therapy to maximize you participation.  IF YOU ARE ON NARCOTIC MEDICATIONS IT IS NOT PERMISSIBLE TO OPERATE A MOTOR VEHICLE (MOTORCYCLE/CAR/TRUCK/MOPED) OR HEAVY MACHINERY DO NOT MIX NARCOTICS WITH OTHER CNS (CENTRAL NERVOUS SYSTEM) DEPRESSANTS SUCH AS ALCOHOL   STOP SMOKING OR USING NICOTINE PRODUCTS!!!!  As discussed nicotine severely impairs your body's ability to heal surgical and traumatic wounds but also impairs bone healing.  Wounds and bone heal by forming microscopic blood vessels (angiogenesis) and nicotine is a vasoconstrictor (essentially, shrinks blood vessels).  Therefore, if vasoconstriction occurs to these microscopic blood vessels they essentially disappear and are unable to deliver necessary nutrients to the healing tissue.  This is one modifiable factor that you can do to dramatically increase your chances of healing your injury.    (This means no smoking, no nicotine gum, patches, etc)  DO NOT USE NONSTEROIDAL ANTI-INFLAMMATORY DRUGS (NSAID'S)  Using products such as Advil (ibuprofen), Aleve (naproxen), Motrin (ibuprofen) for additional pain control during fracture healing can delay and/or prevent the healing response.  If you would like to take over the counter (OTC) medication, Tylenol (acetaminophen) is ok.  However, some narcotic medications that are given for pain control contain acetaminophen as well. Therefore, you should not exceed more than 4000 mg of tylenol in a day if  you do not have liver disease.  Also note that there are may OTC medicines, such as cold medicines and allergy medicines that my contain tylenol as well.  If you have any questions about medications and/or interactions please ask your doctor/PA or your pharmacist.      ICE AND ELEVATE INJURED/OPERATIVE EXTREMITY  Using ice and elevating the injured extremity above your heart can help with swelling and pain control.  Icing in a pulsatile  fashion, such as 20 minutes on and 20 minutes off, can be followed.    Do not place ice directly on skin. Make sure there is a barrier between to skin and the ice pack.    Using frozen items such as frozen peas works well as the conform nicely to the are that needs to be iced.  USE AN ACE WRAP OR TED HOSE FOR SWELLING CONTROL  In addition to icing and elevation, Ace wraps or TED hose are used to help limit and resolve swelling.  It is recommended to use Ace wraps or TED hose until you are informed to stop.    When using Ace Wraps start the wrapping distally (farthest away from the body) and wrap proximally (closer to the body)   Example: If you had surgery on your leg or thing and you do not have a splint on, start the ace wrap at the toes and work your way up to the thigh        If you had surgery on your upper extremity and do not have a splint on, start the ace wrap at your fingers and work your way up to the upper arm  IF YOU ARE IN A SPLINT OR CAST DO NOT [place]   If your splint gets wet for any reason please contact the office immediately. You may shower in your splint or cast as long as you keep it dry.  This can be done by wrapping in a cast cover or garbage back (or similar)  Do Not stick any thing down your splint or cast such as pencils, money, or hangers to try and scratch yourself with.  If you feel itchy take benadryl as prescribed on the bottle for itching  IF YOU ARE IN A CAM BOOT (BLACK BOOT)  You may remove boot periodically.  Perform daily dressing changes as noted below.  Wash the liner of the boot regularly and wear a sock when wearing the boot. It is recommended that you sleep in the boot until told otherwise  CALL THE OFFICE WITH ANY QUESTIONS OR CONCERNS: [phone]   Driving restrictions   Complete by: As directed    No driving   Increase activity slowly as tolerated   Complete by: As directed    Lifting restrictions   Complete by: As directed    No lifting   Non weight bearing   Complete by: As directed    Laterality: left   Extremity: Upper     Allergies as of 12/26/2018      Reactions   Shellfish Allergy Anaphylaxis, Other (See Comments)   ANAPHYLAXIS TO OYSTERS   Aspirin Other (See Comments)   Burns stomach   Penicillins Nausea And Vomiting, Other (See Comments)   PATIENT HAS HAD A PCN REACTION WITH IMMEDIATE RASH, FACIAL/TONGUE/THROAT SWELLING, SOB, OR LIGHTHEADEDNESS WITH HYPOTENSION:  #  #  YES  #  #  Has patient had a PCN reaction causing severe rash involving mucus membranes or skin necrosis: No Has patient had a PCN reaction that required hospitalization: No Has patient had a PCN reaction occurring within the last 10 years: No If all of the above answers are "NO", then may proceed with Cephalosporin use.   Aleve [naproxen] Nausea And Vomiting   Brilinta [ticagrelor] Other (See Comments)   Indigestion   Ciprofloxacin Nausea And Vomiting   Doxycycline Nausea And Vomiting   Evista [raloxifene] Other (See Comments)   LEG ACHES   Fosamax [  alendronate Sodium] Other (See Comments)   Leg aches   Lactose Intolerance (gi) Nausea And Vomiting   Latex Rash   Naproxen Sodium Nausea And Vomiting   Nitrofurantoin Rash, Other (See Comments)   "I broke out down there"   Pravachol [pravastatin] Other (See Comments)   GI upset   Sulfa Antibiotics Nausea And Vomiting   Tape Rash   Welchol [colesevelam Hcl] Other (See Comments)   Stomach cramps   Zocor [simvastatin] Rash      Medication  List    STOP taking these medications   HYDROcodone-acetaminophen 5-325 MG tablet Commonly known as: NORCO/VICODIN     TAKE these medications   acetaminophen 500 MG tablet Commonly known as: TYLENOL Take 1 tablet (500 mg total) by mouth every 6 (six) hours as needed for moderate pain or headache. What changed: how much to take   aspirin 81 MG EC tablet Take 1 tablet (81 mg total) by mouth daily.   cephALEXin 500 MG capsule Commonly known as: KEFLEX Take 500 mg by mouth 2 (two) times daily.   clopidogrel 75 MG tablet Commonly known as: PLAVIX Take 1 tablet (75 mg total) by mouth daily.   dexlansoprazole 60 MG capsule Commonly known as: DEXILANT Take 60 mg by mouth daily.   docusate sodium 100 MG capsule Commonly known as: COLACE Take 1 capsule (100 mg total) by mouth 2 (two) times daily.   erythromycin ophthalmic ointment Place 1 application into the right eye 4 (four) times daily.   escitalopram 20 MG tablet Commonly known as: LEXAPRO Take 20 mg by mouth daily.   ezetimibe 10 MG tablet Commonly known as: ZETIA Take 1 tablet (10 mg total) by mouth at bedtime.   famotidine 20 MG tablet Commonly known as: PEPCID Take 20 mg by mouth at bedtime.   fexofenadine 180 MG tablet Commonly known as: ALLEGRA Take 180 mg by mouth daily as needed for allergies.   furosemide 20 MG tablet Commonly known as: LASIX Take one tablet daily as needed for 3 lbs over night weight gain What changed:   how much to take  how to take this  when to take this  additional instructions   gabapentin 600 MG tablet Commonly known as: Neurontin Take 1 tablet (600 mg total) by mouth 3 (three) times daily. What changed: when to take this   LORazepam 1 MG tablet Commonly known as: ATIVAN Take 1 mg by mouth at bedtime.   lovastatin 40 MG tablet Commonly known as: MEVACOR TAKE 1 TABLET BY MOUTH AT  BEDTIME   Magnesium 500 MG Tabs Take 500 mg by mouth at bedtime.   meclizine 25  MG tablet Commonly known as: ANTIVERT Take 25 mg by mouth 3 (three) times daily as needed for dizziness.   montelukast 10 MG tablet Commonly known as: SINGULAIR Take 10 mg by mouth at bedtime.   mupirocin ointment 2 % Commonly known as: BACTROBAN Place 1 application into the nose 2 (two) times daily.   nitroGLYCERIN 0.4 MG SL tablet Commonly known as: NITROSTAT Place 1 tablet (0.4 mg total) under the tongue every 5 (five) minutes as needed for chest pain (CP or SOB).   nystatin cream Commonly known as: MYCOSTATIN Apply 1 application topically 2 (two) times daily. What changed: when to take this   ondansetron 4 MG tablet Commonly known as: ZOFRAN Take 1 tablet (4 mg total) by mouth every 6 (six) hours as needed for nausea or vomiting.   oxyCODONE-acetaminophen 5-325 MG tablet Commonly  known as: PERCOCET/ROXICET Take 1-2 tablets by mouth every 6 (six) hours as needed for moderate pain or severe pain.   potassium chloride 10 MEQ tablet Commonly known as: K-DUR Take 10 mEq by mouth every 3 (three) days.   Refresh 1.4-0.6 % Soln Generic drug: Polyvinyl Alcohol-Povidone PF Place 2 drops into both eyes 3 (three) times daily as needed (for dry eyes).   vitamin C 500 MG tablet Commonly known as: ASCORBIC ACID Take 500 mg by mouth daily.   Vitamin D 50 MCG (2000 UT) tablet Take 2,000 Units by mouth 2 (two) times daily.            Discharge Care Instructions  (From admission, onward)         Start     Ordered   12/26/18 0000  Non weight bearing    Question Answer Comment  Laterality left   Extremity Upper      12/26/18 1026         Follow-up Information    Altamese Burden, MD. Schedule an appointment as soon as possible for a visit in 3 week(s).   Specialty: Orthopedic Surgery Contact information: [place] [address] [phone]           Discharge Instructions and Plan:  Patient sustained a fairly significant injury to her  right upper extremity.  Fixation was complicated by the fact that is taken her so long to present for surgical intervention.  Even if her original surgical procedure had been performed she was already 3 weeks post injury.  Her fracture is highly suggestive of osteoporosis.  This further increases her complication with relation to healing.  She should continue with vitamin D, vitamin C supplementation.  We can discuss further work-up including a bone density scan in the outpatient setting.  Patient will be splinted for the next 2 weeks with aggressive range of motion thereafter.  She is at increased risk for loss of range of motion from this injury.  We are hopeful that she will recover full range of motion and strength once her fracture is.  Patient will follow-up in the office in 2 to 3 weeks for follow-up x-rays.  Again we discussed that she needs to get her bedbug issue addressed as well before follow-up.  Assistance was offered in terms of communicating with necessary services however patient kindly declined and states that she will get it addressed.  Patient states that she has family that lives close by as well and she can certainly ask them for assistance as well.  Signed:  Jari Pigg, PA-C [phone] (C) 12/26/2018, 10:26 AM  [facility] [place] [address] [phone] (F)

## 2018-12-26 NOTE — Progress Notes (Addendum)
Orthopedic Trauma Service Progress Note  Patient ID: ELLEANOR GUYETT MRN: [id number] DOB/AGE: January 21, 1946 73 y.o.  Subjective:  Doing fine Increased pain R elbow States block wore off yesterday evening Prefers percocet over norco   Pt lives alone with her husband who has dementia and she is his caregiver. States her neighbor is looking after him today but her neighbor has to leave to day to go to [place]   Would like to go home today  Discussed once again that it is imperative for her to get the bedbug issue resolved   No CP, no SOB No h/a or lightheadedness No N/V No abd pain   ROS As above  Objective:   VITALS:   Vitals:   12/25/18 2030 12/26/18 0100 12/26/18 0413 12/26/18 0830  BP: 94/61 95/61 99/61  110/60  Pulse: 87 65 60 64  Resp: 14 14 16 16   Temp: 98.8 F (37.1 C) 98.6 F (37 C) 97.7 F (36.5 C) 98.3 F (36.8 C)  TempSrc: Oral Oral Oral Oral  SpO2:  94% 97% 100%    Estimated body mass index is 36.03 kg/m as calculated from the following:   Height as of 12/13/18: 4\' 8"  (1.422 m).   Weight as of 12/13/18: 72.9 kg.   Intake/Output      08/11 0701 - 08/12 0700 08/12 0701 - 08/13 0700   P.O. 480    I.V. 1377.3    IV Piggyback 100    Total Intake 1957.3    Urine 1400    Blood 25    Total Output 1425    Net +532.3         Urine Occurrence 1 x      LABS  No results found for this or any previous visit (from the past 24 hour(s)).   PHYSICAL EXAM:   Gen: resting comfortably in bed, NAD, appears well  Lungs: unlabored Cardiac: s1 and s2 Ext:       Right Upper Extremity   Splint fitting well   Clinically looks to be in good position    Splint c/d/i  Ext warm   Radial, ulnar, median nv sensation intact  R/U/M/AIN/PIN motor intact  Swelling stable    Assessment/Plan: 1 Day Post-Op   Principal Problem:   Closed fracture of right distal humerus Active Problems:  Paroxysmal atrial fibrillation (HCC)   Hypertension   GERD (gastroesophageal reflux disease)   Arthritis   Obesity (BMI 30-39.9)   Aortic atherosclerosis (HCC)   Bedbug bite   Anxiety and depression   Coronary artery disease   Anti-infectives (From admission, onward)   Start     Dose/Rate Route Frequency Ordered Stop   12/25/18 1630  clindamycin (CLEOCIN) IVPB 600 mg     600 mg 100 mL/hr over 30 Minutes Intravenous Every 6 hours 12/25/18 1625 12/26/18 0421   12/25/18 0700  vancomycin (VANCOCIN) IVPB 1000 mg/200 mL premix     1,000 mg 200 mL/hr over 60 Minutes Intravenous To [facility] 12/24/18 1010 12/25/18 1015    .  POD/HD#: 1  73 y/o RHD white female s/p fall approximately 4 weeks ago with R intra-articular trochlear shear distal humerus fracture   - fall  -R intra-articular trochlear shear distal humerus fracture s/p ORIF  Splint x 2-3 weeks  Ice and elevate  Sling for  comfort  Ok to move fingers for swelling and pain control  PT/OT eval  - Pain management:  Low dose percocet  Tylenol  - ABL anemia/Hemodynamics  Stable  - Medical issues   Numerous chronic medical issues  Resume home meds  - DVT/PE prophylaxis:  Resume home plavix  Does not require additional prophylaxis  - ID:   periop abx completed  - Metabolic Bone Disease:  Discuss metabolic bone health at follow up appointment  Fracture suggestive of poor bone quality   - FEN/GI prophylaxis/Foley/Lines:  Reg diet  -Ex-fix/Splint care:  Keep splint clean and dry    Ok to shower as long as splint is protected from water   - Impediments to fracture healing:  Poor bone quality   Chronic medical issues  Chronic medications (lasix, lexapro, lorazepam)  - Dispo:  Dc home today   Follow up with ortho in 2-3 weeks    Needs to address bedbug issues at home   Jari Pigg, PA-C [phone] (C) 12/26/2018, 10:09 AM  [facility] [place]  [id number] [phone] Domingo Sep (F)

## 2018-12-26 NOTE — Evaluation (Signed)
Occupational Therapy Evaluation Patient Details Name: Angela Bond MRN: [id number] DOB: 07-19-1945 Today's Date: 12/26/2018    History of Present Illness Angela Bond is an 73 y.o. female.with h/o bed bugs and fracture about four weeks ago with fall and right intra-articular elbow fracture andright distal humerus fracture   Clinical Impression   Pt with decline in function and safety with ADLs and ADL mobility with impaired strength, endurance, R UE ROM/function. Pt with R UE immobilized in splint and sling. Pt lives ta home with her husband who she cares for and he has dementia. Pt insisting on going home to care for him despite her medical/health issues. Pt requires min guard A - sup for mobility with no AD and min A with bathing and dressing tasks due to R UE impairments. Pt would benefit from acute OT services to maximize level of function and safety    Follow Up Recommendations  SNF (pt is refusing)   Equipment Recommendations  3 in 1 bedside commode    Recommendations for Other Services       Precautions / Restrictions Precautions Precautions: Fall;Other (comment) Precaution Comments: R distal humeral fracture Required Braces or Orthoses: Sling Restrictions Weight Bearing Restrictions: Yes RUE Weight Bearing: Non weight bearing      Mobility Bed Mobility Overal bed mobility: Needs Assistance Bed Mobility: Supine to Sit     Supine to sit: Supervision;HOB elevated     General bed mobility comments: used rails with L UE, no physical assist required  Transfers Overall transfer level: Needs assistance Equipment used: 1 person hand held assist Transfers: Sit to/from Stand Sit to Stand: Min guard         General transfer comment: from EOB. min guard to steady, pt using rail to steady during transition.    Balance Overall balance assessment: Needs assistance;History of Falls   Sitting balance-Leahy Scale: Fair     Standing balance support: Single  extremity supported;During functional activity Standing balance-Leahy Scale: Fair                             ADL either performed or assessed with clinical judgement   ADL Overall ADL's : Needs assistance/impaired                                             Vision Baseline Vision/History: Wears glasses Wears Glasses: Reading only Patient Visual Report: No change from baseline       Perception     Praxis      Pertinent Vitals/Pain Pain Assessment: 0-10 Pain Score: 6  Pain Location: R UE Pain Descriptors / Indicators: Sore;Throbbing Pain Intervention(s): Monitored during session;Premedicated before session;Repositioned     Hand Dominance Right   Extremity/Trunk Assessment Upper Extremity Assessment Upper Extremity Assessment: RUE deficits/detail;Generalized weakness RUE Deficits / Details: recent nondisplaced distal humerus fx, in sling RUE Coordination: decreased gross motor       Cervical / Trunk Assessment Cervical / Trunk Assessment: Kyphotic   Communication Communication Communication: No difficulties   Cognition Arousal/Alertness: Awake/alert Behavior During Therapy: WFL for tasks assessed/performed Overall Cognitive Status: Within Functional Limits for tasks assessed                                 General Comments: unrealistic  about home safety and insists that she must get home today to care for her husband who has dementia   General Comments       Exercises     Shoulder Instructions      Home Living Family/patient expects to be discharged to:: Private residence Living Arrangements: Spouse/significant other Available Help at Discharge: Other (Comment);Available PRN/intermittently(neighbor) Type of Home: House Home Access: Stairs to enter [employer] of Steps: 6 Entrance Stairs-Rails: Left Home Layout: One level     Bathroom Shower/Tub: Teacher, early years/pre: Standard      Home Equipment: Environmental consultant - 2 wheels;Cane - single point   Additional Comments: spouse has dementia and she is his primary caregiver      Prior Functioning/Environment Level of Independence: Independent        Comments: needs assistance for daily housework        OT Problem List: Decreased strength;Decreased activity tolerance;Impaired balance (sitting and/or standing);Decreased knowledge of use of DME or AE;Decreased knowledge of precautions;Impaired UE functional use;Pain;Cardiopulmonary status limiting activity      OT Treatment/Interventions: Self-care/ADL training;DME and/or AE instruction;Energy conservation;Therapeutic activities;Patient/family education;Balance training    OT Goals(Current goals can be found in the care plan section) Acute Rehab OT Goals Patient Stated Goal: to go directly home OT Goal Formulation: With patient Time For Goal Achievement: 01/02/19 Potential to Achieve Goals: Good ADL Goals Pt Will Perform Grooming: with min guard assist;with supervision;standing Pt Will Perform Upper Body Bathing: with min guard assist;with supervision;with set-up;sitting Pt Will Perform Lower Body Bathing: with min guard assist Pt Will Perform Upper Body Dressing: with supervision;with set-up;with min guard assist;sitting Pt Will Perform Lower Body Dressing: with min guard assist Pt Will Transfer to Toilet: with supervision;with modified independence;ambulating Pt Will Perform Toileting - Clothing Manipulation and hygiene: with min guard assist;with supervision;sit to/from stand Additional ADL Goal #1: Pt will tolerate L finger ROM to decrease rism of joint stiffness and decreased funcitonal use  OT Frequency: Min 3X/week   Barriers to D/C: Decreased caregiver support  pt insisting that she needs to gohome ASAP to care for her spouse with demential       Co-evaluation              AM-PAC OT "6 Clicks" Daily Activity     Outcome Measure Help from another person  eating meals?: None Help from another person taking care of personal grooming?: A Little Help from another person toileting, which includes using toliet, bedpan, or urinal?: A Little Help from another person bathing (including washing, rinsing, drying)?: A Little Help from another person to put on and taking off regular upper body clothing?: A Little Help from another person to put on and taking off regular lower body clothing?: A Lot 6 Click Score: 18   End of Session Equipment Utilized During Treatment: Other (comment)(R UE sling)  Activity Tolerance: Patient tolerated treatment well Patient left: with call bell/phone within reach;in chair  OT Visit Diagnosis: Unsteadiness on feet (R26.81);Pain;History of falling (Z91.81);Muscle weakness (generalized) (M62.81) Pain - Right/Left: Right Pain - part of body: Arm                Time: 9381-8299 OT Time Calculation (min): 27 min Charges:  OT General Charges $OT Visit: 1 Visit OT Evaluation $OT Eval Moderate Complexity: 1 Mod   Britt Bottom 12/26/2018, 11:06 AM

## 2018-12-26 NOTE — TOC Transition Note (Signed)
Transition of Care [facility]) - CM/SW Discharge Note   Patient Details  Name: Angela Bond MRN: [id number] Date of Birth: [birth year]  [facility] CM/SW Contact:  Ninfa Meeker, RN Phone Number: [phone] (working remotely) 12/26/2018, 1:10 PM   Clinical Narrative:  73 yr old female s/p ORIF of right humerus fracture.  No HH needs. Patient will discharge home today. DME ordered.             Patient Goals and CMS Choice        Discharge Placement                       Discharge Plan and Services                                     Social Determinants of Health (SDOH) Interventions     Readmission Risk Interventions No flowsheet data found.

## 2018-12-27 ENCOUNTER — Encounter (HOSPITAL_COMMUNITY): Payer: Self-pay | Admitting: Orthopedic Surgery

## 2019-01-18 NOTE — Op Note (Signed)
NAME: Angela Bond, Angela Bond MEDICAL RECORD [id number] ACCOUNT [id number] DATE OF BIRTH:1945-12-19 FACILITY: [facility] LOCATION: [facility] PHYSICIAN:Doyel Mulkern H. Casmira Cramer, MD  OPERATIVE REPORT  DATE OF PROCEDURE:  12/25/2018  PREOPERATIVE DIAGNOSES:  Right transcondylar distal humerus fracture.  POSTOPERATIVE DIAGNOSES:  Right transcondylar distal humerus fracture.  PROCEDURE:  Open reduction internal fixation of right transcondylar fracture.  SURGEON:  Altamese [place], MD  ASSISTANT:  None.  ANESTHESIA:  General.   COMPLICATIONS:  None.  DISPOSITION:  To PACU.  CONDITION:  Stable.  BRIEF SUMMARY FOR PROCEDURE:  The patient is a pleasant 73 year old right hand dominant female who sustained a displaced trochlear shear fracture involving both her capitellum and trochlea and extending across both condyles of the distal humerus nearly a  month ago.  She was initially seen and evaluated by my colleague, Dr. Charlotte Crumb, and scheduled for surgery on a delayed basis.  On the day of surgery, she sustained a fall, resulting in facial fractures and requiring cancellation of that.  Dr.  Burney Gauze was subsequently unavailable and requested that I assume management of the patient if at all feasible.  I did discuss with the patient the risks and benefits of surgical treatment including the very real likelihood of loss of motion, the  difficulty of fracture reduction and what is now a subacute situation, the increased potential for heterotopic ossification, malunion, nonunion, arthritis, as well as anesthetic complications given her underlying heart disease.  After full discussion,  she strongly wished to proceed.  BRIEF SUMMARY OF PROCEDURE:  The patient was taken to the operating room where general anesthesia was induced.  Her right upper extremity was prepped and draped in the usual sterile fashion.  I used a chlorhexidine wash, Betadine scrub, and paint.  A  tourniquet was placed about the arm. A timeout  was held and then the arm elevated and exsanguinated with an Esmarch bandage.  Tourniquet was inflated.  We made a standard Kocher approach to the elbow joint and were able to identify the rotated fracture  fragment.  It certainly appeared to be a subacute environment without crisp fracture edges, but rather a rounded down and perhaps some associated bone loss.  I was able to derotate this segment of bone within the joint, clean all debris and irrigate it  thoroughly, and secure provisional fixation with K wires.  However, the trochlear segment was not flush with any surface on the medial side and consequently was very unstable, although the articular surface matched up well with the ulna.  I tried a  series of maneuvers to secure this in a stable fashion but was unable to do so.  Consequently, the capitellar fixation was secured with headless cannulated screws and the medial segment preserved to the extent possible in order to accomplish stability at  the elbow joint, but the loose floppy portion was excised to avoid nonunion, instability, and subsequent problems with motion.  Wounds were irrigated thoroughly, closed in standard layered fashion, and then a sterile gently compressive dressing applied  and a splint.  The patient was taken to the PACU in stable condition.  PROGNOSIS:  She will be in a splint for the next 2 weeks.  Because of her prolonged injury, we will see her back for removal and initiation of motion.  We have counseled her extensively regarding the importance of addressing the bed bug infestation, and  that this could result in wound complications, deep infection, and also make it difficult for her to obtain care from other providers.  LN/NUANCE  D:01/18/2019 T:01/18/2019 JOB:[id number]

## 2019-02-21 ENCOUNTER — Encounter: Payer: Self-pay | Admitting: Gynecology

## 2019-03-19 ENCOUNTER — Telehealth: Payer: Self-pay | Admitting: Gynecology

## 2019-03-19 ENCOUNTER — Ambulatory Visit
Admission: RE | Admit: 2019-03-19 | Discharge: 2019-03-19 | Disposition: A | Discharge status: Home / Self Care | Payer: Medicare Other | Source: Ambulatory Visit | Attending: Gynecology | Admitting: Gynecology

## 2019-03-19 ENCOUNTER — Other Ambulatory Visit: Payer: Self-pay

## 2019-03-19 DIAGNOSIS — M8580 Other specified disorders of bone density and structure, unspecified site: Secondary | ICD-10-CM

## 2019-03-19 NOTE — Telephone Encounter (Signed)
Tell patient that her bone density does show a higher fracture risk calculation.  It appears that she also has had a fracture this past year.  I would recommend office visit to discuss osteoporosis treatment options.

## 2019-03-25 NOTE — Telephone Encounter (Signed)
Pt's phone was not taking calls at the time of calling her this morning. Will continue to try patient to speak with her. KW CMA

## 2019-03-26 ENCOUNTER — Encounter: Payer: Self-pay | Admitting: Cardiology

## 2019-03-26 ENCOUNTER — Ambulatory Visit (INDEPENDENT_AMBULATORY_CARE_PROVIDER_SITE_OTHER): Payer: Medicare Other | Admitting: Cardiology

## 2019-03-26 ENCOUNTER — Other Ambulatory Visit: Payer: Self-pay

## 2019-03-26 VITALS — BP 106/60 | HR 72 | Ht <= 58 in | Wt 149.0 lb

## 2019-03-26 DIAGNOSIS — I2510 Atherosclerotic heart disease of native coronary artery without angina pectoris: Secondary | ICD-10-CM | POA: Diagnosis not present

## 2019-03-26 DIAGNOSIS — E7800 Pure hypercholesterolemia, unspecified: Secondary | ICD-10-CM

## 2019-03-26 DIAGNOSIS — I252 Old myocardial infarction: Secondary | ICD-10-CM

## 2019-03-26 DIAGNOSIS — I209 Angina pectoris, unspecified: Secondary | ICD-10-CM | POA: Diagnosis not present

## 2019-03-26 DIAGNOSIS — I951 Orthostatic hypotension: Secondary | ICD-10-CM

## 2019-03-26 DIAGNOSIS — Z79899 Other long term (current) drug therapy: Secondary | ICD-10-CM

## 2019-03-26 DIAGNOSIS — I10 Essential (primary) hypertension: Secondary | ICD-10-CM

## 2019-03-26 LAB — COMPREHENSIVE METABOLIC PANEL
ALT: 8 IU/L (ref 0–32)
AST: 17 IU/L (ref 0–40)
Albumin/Globulin Ratio: 1 — ABNORMAL LOW (ref 1.2–2.2)
Albumin: 3.6 g/dL — ABNORMAL LOW (ref 3.7–4.7)
Alkaline Phosphatase: 91 IU/L (ref 39–117)
BUN/Creatinine Ratio: 13 (ref 12–28)
BUN: 11 mg/dL (ref 8–27)
Bilirubin Total: 0.4 mg/dL (ref 0.0–1.2)
CO2: 26 mmol/L (ref 20–29)
Calcium: 9 mg/dL (ref 8.7–10.3)
Chloride: 99 mmol/L (ref 96–106)
Creatinine, Ser: 0.85 mg/dL (ref 0.57–1.00)
GFR calc Af Amer: 78 mL/min/{1.73_m2} (ref >59)
GFR calc non Af Amer: 68 mL/min/{1.73_m2} (ref >59)
Globulin, Total: 3.5 g/dL (ref 1.5–4.5)
Glucose: 91 mg/dL (ref 65–99)
Potassium: 4.6 mmol/L (ref 3.5–5.2)
Sodium: 139 mmol/L (ref 134–144)
Total Protein: 7.1 g/dL (ref 6.0–8.5)

## 2019-03-26 LAB — CBC
Hematocrit: 25.9 % — ABNORMAL LOW (ref 34.0–46.6)
Hemoglobin: 8 g/dL — ABNORMAL LOW (ref 11.1–15.9)
MCH: 22.7 pg — ABNORMAL LOW (ref 26.6–33.0)
MCHC: 30.9 g/dL — ABNORMAL LOW (ref 31.5–35.7)
MCV: 74 fL — ABNORMAL LOW (ref 79–97)
Platelets: 295 10*3/uL (ref 150–450)
RBC: 3.52 x10E6/uL — ABNORMAL LOW (ref 3.77–5.28)
RDW: 14.4 % (ref 11.7–15.4)
WBC: 5.7 10*3/uL (ref 3.4–10.8)

## 2019-03-26 LAB — LIPID PANEL
Chol/HDL Ratio: 1.8 ratio (ref 0.0–4.4)
Cholesterol, Total: 122 mg/dL (ref 100–199)
HDL: 66 mg/dL (ref >39)
LDL Chol Calc (NIH): 42 mg/dL (ref 0–99)
Triglycerides: 66 mg/dL (ref 0–149)
VLDL Cholesterol Cal: 14 mg/dL (ref 5–40)

## 2019-03-26 NOTE — Progress Notes (Signed)
Cardiology Office Note    Date:  03/26/2019   ID:  Angela Bond, Angela Bond [birth year], MRN [id number]  PCP:  Gaynelle Arabian, MD  Cardiologist:   Candee Furbish, MD     History of Present Illness:  Angela Bond is a 73 y.o. female with a past medical history of CAD s/p NSTEMI in June 2017 with DES to mid LAD, HTN, orthostatic hypotension, HLD, and anxiety who presented to [facility] with chest pain. There was residual disease in her proximal RCA (30%) and 35% in his ostial 2nd diagonal. She was placed on DAPT with ASA and Brilinta which was switched to Plavix outpatient for cost reasons. Of note, she was seen in the ED on 11/10/15 for chest pain 4 days after she was discharged from her NSTEMI. A GI cocktail relieved her pain in the ED and she was discharged from the ED. Her lisinopril was discontinued in July of this year as she had some orthostatic hypotension in the office. She reported compliance with medical therapy.   During one cardiac rehab session, she was hypotensive with BP of 88/60 post exercise. She was given a Gatorade and her BP rose to 94/58. She was dizzy and diaphoretic at the time. She called our office and her beta blocker (metoprolol 12.5 mg BID) was discontinued. She was at cardiac rehab again and developed left sided chest pain with radiation to her neck and shoulder with associated SOB, diaphoresis and nausea, somewhat reminiscent of prior angina. EKG during pain showed no acute ST/T changes.  Initial troponin was negative.Vitals were stable with BP 142/98 (mildly hypertensive). CXR NAD. Dr. Burt Knack felt her symptoms were difficult to sort out. Her PCI was just performed about 2 months prior to this and he felt it was unlikely that she has restenosis or new coronary plaque rupture as she has been compliant with medical therapy. Labs otherwise notable for Hgb 10.7 (c/w prior). Per notes from our team,she had troponin rechecked down in nuc med and it was negative. She underwent  inpatient stress test. This showed septal scar/infarction, no findings for ischemia, EF 68%. It was recommended to try her back on a nightly dose of Toprol 12.5mg  daily but she was unable to take.  She did not wish to take atorvastatin, Crestor was too expensive. She is now on lovastatin in doing well with this. She was reassured by the nuclear stress test that was low risk with no ischemia. She is now able to walk without any difficulty. She is less short of breath. Overall she is happy. She changed her depression medicine and she is feeling much better.  09/22/16-she comes in today for follow-up. Notes from Truitt Merle and pharmacy team reviewed. Crestor too weak, legs hurt. No CP, no SOB (mild up hill to house) She is also full of small skin lesions.  06/22/17 - getting over cold, mild wheeze. No fever, chills, no syncope, no bleeding. Skin rash has improved. No CP.   03/26/19 -here for coronary artery disease follow-up.  She has lost about 20 to 30 pounds.  Doing well.  No fevers chills nausea vomiting syncope bleeding.  Taking her medications.  She did have a humeral fracture.  Has trouble with full extension of her right arm.   Past Medical History:  Diagnosis Date   Anemia    hx   Anxiety and depression    Aortic atherosclerosis ([place]) 07/31/2017   Arthritis    "~ all over my body; in the joints"  Breast cancer, right ([place]) 3/16   Coronary artery disease    a.  s/p NSTEMI in June 2017 with DES to mid LAD.   Depression    Gallstones    GERD (gastroesophageal reflux disease)    H/O hiatal hernia    Headache    Heart murmur    History of blood transfusion    "when taking chemo & w/one of my knee replacements" (11/05/2015)   Hypercholesteremia    Hypertension    Iron deficiency anemia 02/16/2017   Multiple bruises    Myocardial infarction (HCC)    Orthostatic hypotension    Personal history of chemotherapy    Personal history of radiation therapy     Pneumonia X 1   Shingles 5/15   hx   Sliding hiatal hernia s/p lap PEH repair 07/31/2012   Unstable angina (HCC) 01/23/2012   Unsteady gait    " I fall easily"   Wears dentures    partial   Wears glasses     Past Surgical History:  Procedure Laterality Date   BREAST BIOPSY Right    BREAST LUMPECTOMY Right 08/2014   BREAST LUMPECTOMY WITH SENTINEL LYMPH NODE BIOPSY Right 09/22/2014   Procedure: RIGHT BREAST LUMPECTOMY WITH NEEDLE LOCALIZATION AND RIGHT AXILLARY SENTINEL LYMPH NODE BIOPSY;  Surgeon: [name], MD;  Location: [place];  Service: General;  Laterality: Right;   CARDIAC CATHETERIZATION     CARDIAC CATHETERIZATION N/A 11/05/2015   Procedure: Left Heart Cath and Coronary Angiography;  Surgeon: Belva Crome, MD;  Location: [place] [facility];  Service: Cardiovascular;  Laterality: N/A;   CARDIAC CATHETERIZATION N/A 11/05/2015   Procedure: Coronary Stent Intervention;  Surgeon: Belva Crome, MD;  Location: [place] [facility];  Service: Cardiovascular;  Laterality: N/A;   CARPAL TUNNEL RELEASE Right 09/2015   CHOLECYSTECTOMY N/A 03/28/2018   Procedure: LAPAROSCOPIC CHOLECYSTECTOMY WITH INTRAOPERATIVE CHOLANGIOGRAM ERAS PATHWAY;  Surgeon: Jovita Kussmaul, MD;  Location: [place];  Service: General;  Laterality: N/A;   CORONARY ANGIOPLASTY WITH STENT PLACEMENT  11/05/2015   "1 stent"   ESOPHAGEAL MANOMETRY N/A 07/02/2012   Procedure: ESOPHAGEAL MANOMETRY (EM);  Surgeon: Lear Ng, MD;  Location: [facility];  Service: Endoscopy;  Laterality: N/A;  schooler to read   EYE SURGERY Left 2013   tube placed tear duct   HIATAL HERNIA REPAIR N/A 08/10/2012   Procedure: LAPAROSCOPIC REPAIR OF HIATAL HERNIA WITH MESH AND EGD WITH PEG TUBE PLACEMENT;  Surgeon: Ralene Ok, MD;  Location: [facility];  Service: General;  Laterality: N/A;   LEFT HEART CATH AND CORONARY ANGIOGRAPHY N/A 08/01/2017   Procedure: LEFT HEART CATH AND CORONARY ANGIOGRAPHY;  Surgeon: Jettie Booze, MD;  Location: [place] [facility];  Service: Cardiovascular;  Laterality: N/A;   ORIF ELBOW FRACTURE Right 12/25/2018   ORIF HUMERUS FRACTURE Right 12/25/2018   Procedure: OPEN REDUCTION INTERNAL FIXATION (ORIF) DISTAL HUMERUS FRACTURE;  Surgeon: Altamese Filer, MD;  Location: [place];  Service: Orthopedics;  Laterality: Right;   PORT-A-CATH REMOVAL  06/2015   PORTACATH PLACEMENT Left 09/22/2014   Procedure: INSERTION PORT-A-CATH;  Surgeon: [name], MD;  Location: [place];  Service: General;  Laterality: Left;   POSTERIOR LUMBAR FUSION  2016   "had 2 vertebrae fused"   TONSILLECTOMY  as child   TOTAL KNEE ARTHROPLASTY Bilateral 2011,2012    Current Medications: Outpatient Medications Prior to Visit  Medication Sig Dispense Refill   acetaminophen (TYLENOL) 500 MG tablet Take 1  tablet (500 mg total) by mouth every 6 (six) hours as needed for moderate pain or headache. 30 tablet 0   aspirin EC 81 MG EC tablet Take 1 tablet (81 mg total) by mouth daily.     Cholecalciferol (VITAMIN D) 2000 UNITS tablet Take 2,000 Units by mouth 2 (two) times daily.      dexlansoprazole (DEXILANT) 60 MG capsule Take 60 mg by mouth daily.     docusate sodium (COLACE) 100 MG capsule Take 1 capsule (100 mg total) by mouth 2 (two) times daily. 10 capsule 1   erythromycin ophthalmic ointment Place 1 application into the right eye 4 (four) times daily.     escitalopram (LEXAPRO) 20 MG tablet Take 20 mg by mouth daily.     ezetimibe (ZETIA) 10 MG tablet Take 1 tablet (10 mg total) by mouth at bedtime. 90 tablet 3   famotidine (PEPCID) 20 MG tablet Take 20 mg by mouth at bedtime.      fexofenadine (ALLEGRA) 180 MG tablet Take 180 mg by mouth daily as needed for allergies.      furosemide (LASIX) 20 MG tablet Take one tablet daily as needed for 3 lbs over night weight gain 30 tablet 3   gabapentin (NEURONTIN) 600 MG tablet Take 1 tablet (600 mg total) by mouth 3 (three) times daily. 90  tablet 0   LORazepam (ATIVAN) 1 MG tablet Take 1 mg by mouth at bedtime.      lovastatin (MEVACOR) 40 MG tablet TAKE 1 TABLET BY MOUTH AT  BEDTIME 90 tablet 2   Magnesium 500 MG TABS Take 500 mg by mouth at bedtime.     meclizine (ANTIVERT) 25 MG tablet Take 25 mg by mouth 3 (three) times daily as needed for dizziness.      montelukast (SINGULAIR) 10 MG tablet Take 10 mg by mouth at bedtime.     mupirocin ointment (BACTROBAN) 2 % Place 1 application into the nose 2 (two) times daily. 22 g 0   nitroGLYCERIN (NITROSTAT) 0.4 MG SL tablet Place 1 tablet (0.4 mg total) under the tongue every 5 (five) minutes as needed for chest pain (CP or SOB). 25 tablet 6   nystatin cream (MYCOSTATIN) Apply 1 application topically 2 (two) times daily. 30 g 4   ondansetron (ZOFRAN) 4 MG tablet Take 1 tablet (4 mg total) by mouth every 6 (six) hours as needed for nausea or vomiting. 20 tablet 0   oxyCODONE-acetaminophen (PERCOCET/ROXICET) 5-325 MG tablet Take 1-2 tablets by mouth every 6 (six) hours as needed for moderate pain or severe pain. 50 tablet 0   Polyvinyl Alcohol-Povidone PF (REFRESH) 1.4-0.6 % SOLN Place 2 drops into both eyes 3 (three) times daily as needed (for dry eyes).     potassium chloride (K-DUR) 10 MEQ tablet Take 10 mEq by mouth every 3 (three) days.     vitamin C (ASCORBIC ACID) 500 MG tablet Take 500 mg by mouth daily.     clopidogrel (PLAVIX) 75 MG tablet Take 1 tablet (75 mg total) by mouth daily. 90 tablet 3   cephALEXin (KEFLEX) 500 MG capsule Take 500 mg by mouth 2 (two) times daily.     No facility-administered medications prior to visit.      Allergies:   Shellfish allergy, Aspirin, Penicillins, Aleve [naproxen], Brilinta [ticagrelor], Ciprofloxacin, Doxycycline, Evista [raloxifene], Fosamax [alendronate sodium], Lactose intolerance (gi), Latex, Naproxen sodium, Nitrofurantoin, Pravachol [pravastatin], Sulfa antibiotics, Tape, Welchol [colesevelam hcl], and Zocor  [simvastatin]   Social History  Socioeconomic History   Marital status: Married    Spouse name: Not on file   Number of children: 1   Years of education: Not on file   Highest education level: Not on file  Occupational History   Not on file  Social Needs   Financial resource strain: Not on file   Food insecurity    Worry: Not on file    Inability: Not on file   Transportation needs    Medical: Not on file    Non-medical: Not on file  Tobacco Use   Smoking status: Never Smoker   Smokeless tobacco: Never Used  Substance and Sexual Activity   Alcohol use: No   Drug use: No   Sexual activity: Not Currently    Comment: 1st intercourse 59 yo-2 partners  Lifestyle   Physical activity    Days per week: Not on file    Minutes per session: Not on file   Stress: Not on file  Relationships   Social connections    Talks on phone: Not on file    Gets together: Not on file    Attends religious service: Not on file    Active member of club or organization: Not on file    Attends meetings of clubs or organizations: Not on file    Relationship status: Not on file  Other Topics Concern   Not on file  Social History Narrative   Lives with husband.  Does not use assist device.       Family History:  The patient's family history includes Cancer in her father; Cancer (age of onset: 68) in her sister; Diabetes in her father and sister; Heart disease in her father, mother, and sister.   ROS:   Please see the history of present illness.    Review of Systems  All other systems reviewed and are negative.   PHYSICAL EXAM:   VS:  BP 106/60    Pulse 72    Ht 4\' 8"  (1.422 m)    Wt 149 lb (67.6 kg)    SpO2 99%    BMI 33.41 kg/m    GEN: Well nourished, well developed, in no acute distress HEENT: normal  Neck: no JVD, carotid bruits, or masses Cardiac: RRR; no murmurs, rubs, or gallops,no edema  Respiratory:  clear to auscultation bilaterally, normal work of breathing GI:  soft, nontender, nondistended, + BS MS: no deformity or atrophy  Skin: warm and dry, no rash, still having some skin excoriations.  I did note when insect crawling on her leg. Neuro:  Alert and Oriented x 3, Strength and sensation are intact Psych: euthymic mood, full affect   Wt Readings from Last 3 Encounters:  03/26/19 149 lb (67.6 kg)  12/13/18 160 lb 11.5 oz (72.9 kg)  12/12/18 160 lb 12.8 oz (72.9 kg)      Studies/Labs Reviewed:   EKG:  None today  Recent Labs: 12/13/2018: Magnesium 1.9; TSH 0.932 12/25/2018: ALT 12; BUN 7; Creatinine, Ser 0.78; Hemoglobin 11.7; Platelets 204; Potassium 4.3; Sodium 140   Lipid Panel    Component Value Date/Time   CHOL 153 08/01/2017 0355   CHOL 152 03/21/2017 0935   TRIG 63 08/01/2017 0355   HDL 59 08/01/2017 0355   HDL 66 03/21/2017 0935   CHOLHDL 2.6 08/01/2017 0355   VLDL 13 08/01/2017 0355   LDLCALC 81 08/01/2017 0355   LDLCALC 69 03/21/2017 0935    Additional studies/ records that were reviewed today include:   Cath  11/05/15: 1. Prox RCA to Mid RCA lesion, 30% stenosed. 2. Mid LAD lesion, 99% stenosed. Post intervention, there is a 5% residual stenosis. 3. Ost 2nd Diag lesion, 35% stenosed.    Acute coronary syndrome caused by acute/subacute thrombotic obstruction of the proximal LAD forming a [place] 111 bifurcation lesion with the large diagonal #1.  Luminal irregularities are noted in the circumflex and right coronary.  Severe anteroapical hypokinesis with estimated ejection fraction 35-40%. LV end-diastolic pressure is normal.  Successful PTCA and stent of the LAD thrombus containing lesion with reduction in the 95% stenosis to 0% with TIMI grade 3 flow using a drug-eluting stent postdilated to 3.5 mm in diameter. The diagonal branch though "jailed" ended with less than 40% ostial obstruction.   NUC stress 01/16/16: 1. Septal scar/infarction.  No findings for ischemia.  2. Septal wall motion abnormality and bulging  of the inferoseptal wall with contraction.  3. Left ventricular ejection fraction 68%  4. Non invasive risk stratification*: Intermediate to low risk.    ASSESSMENT:    1. Angina pectoris ([place])   2. CAD in native artery   3. Orthostatic hypotension   4. History of MI (myocardial infarction)   5. Long-term use of high-risk medication   6. Hypercholesteremia   7. Essential hypertension      PLAN:  In order of problems listed above:  Coronary artery disease  - DES to mid LAD 2017 with mild residual disease RCA and ostial second diagonal.  We will go ahead and discontinue her Plavix at this point since been over 3 years.  Continue with aspirin 81 mg monotherapy. - Since she had pain during cardiac rehabilitation, she was admitted and had inpatient stress test which was stable without ischemia, EF 68%. They tried to rechallenge her Toprol but this was stopped again.  - Overall she's not having any chest discomfort stable. Doing well.   Angina -Well-controlled with current regimen.  Brief episode post PCI, reassurance with nuclear stress test.  Negative troponin.  Continue with aspirin.  Exercise.  History of MI  - June 2017-non-ST elevation myocardial infarction. DES to mid LAD.  Discussed again today.  Orthostatic hypotension  - Currently very stable. We have to be careful with her blood pressure medications. She has been hypotensive at times post exercise. Previously lisinopril was discontinued because of orthostasis. Beta blocker had been stopped as well because of orthostasis.  - Willing to tolerate higher blood pressure. No changes.  Sometimes she will feel little bit wobbly when getting up from a seated position.  She knows to be careful and to take her time.  We would have the option of midodrine in the future if necessary.  Hyperlipidemia  -She states that she did not tolerate the Crestor. Stopped it because of leg weakness and cramping. Back on lovastatin 40 and add  Zetia.LDL 69 on 03/2017.  We will go ahead and check CBC, lipid panel, complete metabolic profile  Brief postoperative atrial fibrillation in 2014 during PEG tube placement, EGD -No further episodes.  No anticoagulation warranted at that time.  Brief.  Bedbugs -Noted on pants.  Her husband also had bedbugs at a prior visit here in our clinic.  Took a taxi in.  We are treating the room.  12 month follow-up    Medication Adjustments/Labs and Tests Ordered: Current medicines are reviewed at length with the patient today.  Concerns regarding medicines are outlined above.  Medication changes, Labs and Tests ordered today are listed in the  Patient Instructions below. Patient Instructions  Medication Instructions:  Please discontinue your Plavix.  Continue all other medications as listed.  *If you need a refill on your cardiac medications before your next appointment, please call your pharmacy*  Lab:  Please have blood work today (CMP, CBC and Lipid)  Follow-Up: At [facility], you and your health needs are our priority.  As part of our continuing mission to provide you with exceptional heart care, we have created designated Provider Care Teams.  These Care Teams include your primary Cardiologist (physician) and Advanced Practice Providers (APPs -  Physician Assistants and Nurse Practitioners) who all work together to provide you with the care you need, when you need it.  Your next appointment:   12 months  The format for your next appointment:   In Person  Provider:   Candee Furbish, MD  Thank you for choosing [facility]!!        Signed, Candee Furbish, MD  03/26/2019 11:25 AM    [facility] [place], [place], [place]  [address] Phone: [phone]; Fax: [phone]

## 2019-03-26 NOTE — Patient Instructions (Addendum)
Medication Instructions:  Please discontinue your Plavix.  Continue all other medications as listed.  *If you need a refill on your cardiac medications before your next appointment, please call your pharmacy*  Lab:  Please have blood work today (CMP, CBC and Lipid)  Follow-Up: At [facility], you and your health needs are our priority.  As part of our continuing mission to provide you with exceptional heart care, we have created designated Provider Care Teams.  These Care Teams include your primary Cardiologist (physician) and Advanced Practice Providers (APPs -  Physician Assistants and Nurse Practitioners) who all work together to provide you with the care you need, when you need it.  Your next appointment:   12 months  The format for your next appointment:   In Person  Provider:   Candee Furbish, MD  Thank you for choosing [facility]!!

## 2019-03-29 ENCOUNTER — Encounter: Payer: Self-pay | Admitting: *Deleted

## 2019-04-03 ENCOUNTER — Other Ambulatory Visit: Payer: Self-pay

## 2019-04-03 NOTE — Patient Outreach (Signed)
[facility] Evaluation Interviewer attempted to call patient on today regarding A[facility] Gracefully referral. Telephone states person is not excepting any phone calls at this time.  Will attempt to call back tomorrow.  Alistair Senft Care Management Assistant 

## 2019-04-04 ENCOUNTER — Encounter: Payer: Self-pay | Admitting: *Deleted

## 2019-04-04 NOTE — Telephone Encounter (Signed)
Phone still not taking calls, letter send to patient to call office.

## 2019-04-05 ENCOUNTER — Telehealth: Payer: Self-pay | Admitting: Cardiology

## 2019-04-05 NOTE — Telephone Encounter (Signed)
Results faxed to PCP at pt's request.

## 2019-04-05 NOTE — Telephone Encounter (Signed)
Patient states her lab results from 03/27/19 were never received by her PCP, Dr. Gaynelle Arabian. He is at [facility] at [employer] number: [phone]

## 2019-04-09 ENCOUNTER — Other Ambulatory Visit: Payer: Self-pay | Admitting: *Deleted

## 2019-04-09 DIAGNOSIS — D509 Iron deficiency anemia, unspecified: Secondary | ICD-10-CM

## 2019-04-10 ENCOUNTER — Telehealth: Payer: Self-pay | Admitting: *Deleted

## 2019-04-10 ENCOUNTER — Encounter: Payer: Self-pay | Admitting: *Deleted

## 2019-04-10 NOTE — Telephone Encounter (Signed)
Attempt x2 to call pt to schedule lab apt and follow up with MD for iron deficiency anemia.  No answer and no VM set up to leave a message.

## 2019-04-10 NOTE — Progress Notes (Signed)
Received lab results from Dr. Marlou Porch office showing a new decrease in hemoglobin.  Pt with hx of iron deficiency anemia.  Per Dr. Lindi Adie, pt to be scheduled for labs and f/u with MD two days later.  Apt scheduled and pt notified and verbalized understanding.

## 2019-04-15 ENCOUNTER — Other Ambulatory Visit: Payer: Self-pay

## 2019-04-15 ENCOUNTER — Other Ambulatory Visit: Payer: Self-pay | Admitting: Hematology and Oncology

## 2019-04-15 ENCOUNTER — Inpatient Hospital Stay: Payer: Medicare Other | Attending: Hematology and Oncology

## 2019-04-15 DIAGNOSIS — D509 Iron deficiency anemia, unspecified: Secondary | ICD-10-CM | POA: Diagnosis not present

## 2019-04-15 LAB — CBC WITH DIFFERENTIAL (CANCER CENTER ONLY)
Abs Immature Granulocytes: 0.02 10*3/uL (ref 0.00–0.07)
Basophils Absolute: 0 10*3/uL (ref 0.0–0.1)
Basophils Relative: 1 %
Eosinophils Absolute: 0.4 10*3/uL (ref 0.0–0.5)
Eosinophils Relative: 7 %
HCT: 27.7 % — ABNORMAL LOW (ref 36.0–46.0)
Hemoglobin: 7.9 g/dL — ABNORMAL LOW (ref 12.0–15.0)
Immature Granulocytes: 0 %
Lymphocytes Relative: 21 %
Lymphs Abs: 1.3 10*3/uL (ref 0.7–4.0)
MCH: 21.3 pg — ABNORMAL LOW (ref 26.0–34.0)
MCHC: 28.5 g/dL — ABNORMAL LOW (ref 30.0–36.0)
MCV: 74.7 fL — ABNORMAL LOW (ref 80.0–100.0)
Monocytes Absolute: 0.6 10*3/uL (ref 0.1–1.0)
Monocytes Relative: 9 %
Neutro Abs: 3.7 10*3/uL (ref 1.7–7.7)
Neutrophils Relative %: 62 %
Platelet Count: 265 10*3/uL (ref 150–400)
RBC: 3.71 MIL/uL — ABNORMAL LOW (ref 3.87–5.11)
RDW: 15.9 % — ABNORMAL HIGH (ref 11.5–15.5)
WBC Count: 6 10*3/uL (ref 4.0–10.5)
nRBC: 0 % (ref 0.0–0.2)

## 2019-04-15 LAB — IRON AND TIBC
Iron: 15 ug/dL — ABNORMAL LOW (ref 41–142)
Saturation Ratios: 3 % — ABNORMAL LOW (ref 21–57)
TIBC: 456 ug/dL — ABNORMAL HIGH (ref 236–444)
UIBC: 441 ug/dL — ABNORMAL HIGH (ref 120–384)

## 2019-04-15 LAB — FERRITIN: Ferritin: 27 ng/mL (ref 11–307)

## 2019-04-16 ENCOUNTER — Telehealth: Payer: Self-pay | Admitting: Hematology and Oncology

## 2019-04-16 NOTE — Progress Notes (Signed)
Patient Care Team: Angela Arabian, MD as PCP - General (Family Medicine) Jerline Pain, MD as PCP - Cardiology (Cardiology) Wonda Horner, MD as Consulting Physician (Gastroenterology) Ralene Ok, MD as Consulting Physician (Surgery) Nicholas Lose, MD as Consulting Physician (Hematology and Oncology) Sylvan Cheese, NP as Nurse Practitioner (Hematology and Oncology) Jovita Kussmaul, MD as Consulting Physician (General Surgery) Thea Silversmith, MD as Consulting Physician (Radiation Oncology)  DIAGNOSIS:    ICD-10-CM   1. Malignant neoplasm of upper-outer quadrant of right breast in female, estrogen receptor negative ([place])  C50.411    Z17.1   2. Iron deficiency anemia, unspecified iron deficiency anemia type  D50.9     SUMMARY OF ONCOLOGIC HISTORY: Oncology History  Breast cancer of upper-outer quadrant of right female breast ([place])  07/30/2014 Mammogram   Right breast: Mass at 10 o'clock location 4 cm from the nipple measuring 1.7 x 1.3 x 1.3 cm. This corresponds to the palpable and mammographic finding. In the right axilla, there are several lymph nodes which are normal in size measuring up to 59m.   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%), PR- (0%), HER2/neu negative, Ki67 30%   08/27/2014 Clinical Stage   Stage IIA: T2 N0   09/22/2014 Definitive Surgery   Right lumpectomy/SLNB (Marlou Starks: Invasive ductal carcinoma, grade 3, 2.6 cm, high grade DCIS, LVI present, ER-, PR-, 2 LN removed and 1 positive for metastatic carcinoma (1/2)   09/22/2014 Pathologic Stage   Stage IIB: T2 N1 M0   10/15/2014 - 12/16/2014 Chemotherapy   Adjuvant chemotherapy with Taxotere Cytoxan 4 q3 weeks   10/21/2014 - 10/24/2014 Hospital Admission   Admission for neutropenic fever UTI Klebsiella   01/26/2015 - 03/11/2015 Radiation Therapy   Adjuvant RT (Pablo Ledger: Right breast / 45 Gray @ 1.8 GPearline Cablesper fraction x 25 fractions  Right supraclavicular fossa/PAB 45 Gy _0 .8 Gy per fraction x 25 fractions Right breast boost / 16 Gray at 2[employer] Corporationper fraction x 8 fractions   05/19/2015 Survivorship   Survivorship care plan visit completed.   03/28/2018 Surgery   Laparoscopic cholecystectomy     CHIEF COMPLIANT: Follow-up of breast cancer and iron deficiency anemia  INTERVAL HISTORY: Angela DOMANSKIis a 73y.o. with above-mentioned history of right breast cancer treated with lumpectomy, adjuvant chemotherapy, and radiation who is currently on surveillance. She was also diagnosed with iron deficiency anemia and previously treated with IV iron. Labs on 04/15/19 showed Hg 7.9, HCT 27.7, MCV 74.7, iron saturation 3%, TIBC 456, ferritin 27. She presents to the clinic today for follow-up.   REVIEW OF SYSTEMS:   Constitutional: Denies fevers, chills or abnormal weight loss Eyes: Denies blurriness of vision Ears, nose, mouth, throat, and face: Denies mucositis or sore throat Respiratory: Denies cough, dyspnea or wheezes Cardiovascular: Denies palpitation, chest discomfort Gastrointestinal: Denies nausea, heartburn or change in bowel habits Skin: Denies abnormal skin rashes Lymphatics: Denies new lymphadenopathy or easy bruising Neurological: Denies numbness, tingling or new weaknesses Behavioral/Psych: Mood is stable, no new changes  Extremities: No lower extremity edema Breast: denies any pain or lumps or nodules in either breasts All other systems were reviewed with the patient and are negative.  I have reviewed the past medical history, past surgical history, social history and family history with the patient and they are unchanged from previous note.  ALLERGIES:  is allergic to shellfish allergy; aspirin; penicillins; aleve [naproxen]; brilinta [ticagrelor]; ciprofloxacin;  doxycycline; evista [raloxifene]; fosamax [alendronate sodium]; lactose intolerance (gi); latex; naproxen sodium; nitrofurantoin; pravachol  [pravastatin]; sulfa antibiotics; tape; welchol [colesevelam hcl]; and zocor [simvastatin].  MEDICATIONS:  Current Outpatient Medications  Medication Sig Dispense Refill  . acetaminophen (TYLENOL) 500 MG tablet Take 1 tablet (500 mg total) by mouth every 6 (six) hours as needed for moderate pain or headache. 30 tablet 0  . aspirin EC 81 MG EC tablet Take 1 tablet (81 mg total) by mouth daily.    . Cholecalciferol (VITAMIN D) 2000 UNITS tablet Take 2,000 Units by mouth 2 (two) times daily.     Marland Kitchen dexlansoprazole (DEXILANT) 60 MG capsule Take 60 mg by mouth daily.    Marland Kitchen docusate sodium (COLACE) 100 MG capsule Take 1 capsule (100 mg total) by mouth 2 (two) times daily. 10 capsule 1  . erythromycin ophthalmic ointment Place 1 application into the right eye 4 (four) times daily.    Marland Kitchen escitalopram (LEXAPRO) 20 MG tablet Take 20 mg by mouth daily.    Marland Kitchen ezetimibe (ZETIA) 10 MG tablet Take 1 tablet (10 mg total) by mouth at bedtime. 90 tablet 3  . famotidine (PEPCID) 20 MG tablet Take 20 mg by mouth at bedtime.     . fexofenadine (ALLEGRA) 180 MG tablet Take 180 mg by mouth daily as needed for allergies.     . furosemide (LASIX) 20 MG tablet Take one tablet daily as needed for 3 lbs over night weight gain 30 tablet 3  . gabapentin (NEURONTIN) 600 MG tablet Take 1 tablet (600 mg total) by mouth 3 (three) times daily. 90 tablet 0  . LORazepam (ATIVAN) 1 MG tablet Take 1 mg by mouth at bedtime.     . lovastatin (MEVACOR) 40 MG tablet TAKE 1 TABLET BY MOUTH AT  BEDTIME 90 tablet 2  . Magnesium 500 MG TABS Take 500 mg by mouth at bedtime.    . meclizine (ANTIVERT) 25 MG tablet Take 25 mg by mouth 3 (three) times daily as needed for dizziness.     . montelukast (SINGULAIR) 10 MG tablet Take 10 mg by mouth at bedtime.    . mupirocin ointment (BACTROBAN) 2 % Place 1 application into the nose 2 (two) times daily. 22 g 0  . nitroGLYCERIN (NITROSTAT) 0.4 MG SL tablet Place 1 tablet (0.4 mg total) under the tongue  every 5 (five) minutes as needed for chest pain (CP or SOB). 25 tablet 6  . nystatin cream (MYCOSTATIN) Apply 1 application topically 2 (two) times daily. 30 g 4  . ondansetron (ZOFRAN) 4 MG tablet Take 1 tablet (4 mg total) by mouth every 6 (six) hours as needed for nausea or vomiting. 20 tablet 0  . oxyCODONE-acetaminophen (PERCOCET/ROXICET) 5-325 MG tablet Take 1-2 tablets by mouth every 6 (six) hours as needed for moderate pain or severe pain. 50 tablet 0  . Polyvinyl Alcohol-Povidone PF (REFRESH) 1.4-0.6 % SOLN Place 2 drops into both eyes 3 (three) times daily as needed (for dry eyes).    . potassium chloride (K-DUR) 10 MEQ tablet Take 10 mEq by mouth every 3 (three) days.    . vitamin C (ASCORBIC ACID) 500 MG tablet Take 500 mg by mouth daily.     No current facility-administered medications for this visit.     PHYSICAL EXAMINATION: ECOG PERFORMANCE STATUS: 1 - Symptomatic but completely ambulatory  There were no vitals filed for this visit. There were no vitals filed for this visit.  GENERAL: alert, no distress and comfortable  SKIN: skin color, texture, turgor are normal, no rashes or significant lesions EYES: normal, Conjunctiva are pink and non-injected, sclera clear OROPHARYNX: no exudate, no erythema and lips, buccal mucosa, and tongue normal  NECK: supple, thyroid normal size, non-tender, without nodularity LYMPH: no palpable lymphadenopathy in the cervical, axillary or inguinal LUNGS: clear to auscultation and percussion with normal breathing effort HEART: regular rate & rhythm and no murmurs and no lower extremity edema ABDOMEN: abdomen soft, non-tender and normal bowel sounds MUSCULOSKELETAL: no cyanosis of digits and no clubbing  NEURO: alert & oriented x 3 with fluent speech, no focal motor/sensory deficits EXTREMITIES: No lower extremity edema  LABORATORY DATA:  I have reviewed the data as listed CMP Latest Ref Rng & Units 03/26/2019 12/25/2018 12/13/2018  Glucose 65  - 99 mg/dL 91 88 97  BUN 8 - 27 mg/dL 11 7(L) 11  Creatinine 0.57 - 1.00 mg/dL 0.85 0.78 0.71  Sodium 134 - 144 mmol/L 139 140 138  Potassium 3.5 - 5.2 mmol/L 4.6 4.3 3.5  Chloride 96 - 106 mmol/L 99 103 101  CO2 20 - 29 mmol/L _0 Calcium 8.7 - 10.3 mg/dL 9.0 9.0 8.4(L)  Total Protein 6.0 - 8.5 g/dL 7.1 7.6 6.4(L)  Total Bilirubin 0.0 - 1.2 mg/dL 0.4 1.0 0.9  Alkaline Phos 39 - 117 IU/L 91 92 82  AST 0 - 40 IU/L _1 ALT 0 - 32 IU/L _2 Lab Results  Component Value Date   WBC 6.0 04/15/2019   HGB 7.9 (L) 04/15/2019   HCT 27.7 (L) 04/15/2019   MCV 74.7 (L) 04/15/2019   PLT 265 04/15/2019   NEUTROABS 3.7 04/15/2019    ASSESSMENT & PLAN:  Breast cancer of upper-outer quadrant of right female breast ([place]) Diagnosed 2016 treated with lumpectomy, Taxotere/Cytoxan as well as radiation therapy, triple negative  Breast cancer surveillance: 1.Breast exam 05/18/2018: Benign 2.Mammogram and ultrasound 10/17/2018 no mammographic evidence of malignancy, breast density category C  Iron deficiency anemia 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 Patient received blood transfusion at [place] given on 02/17/2017, 11/22/2017, January 2020  Lab review: 04/15/2019: Hemoglobin 7.9, MCV 74.7, RDW 15.9, ferritin 27, iron saturation 3%, TIBC 456 Recommendation: IV iron with Venofer  Recheck labs and follow-up in Jan 2021    No orders of the defined types were placed in this encounter.  The patient has a good understanding of the overall plan. she agrees with it. she will call with any problems that may develop before the next visit here.  Nicholas Lose, MD 04/17/2019  Julious Oka Dorshimer, am acting as scribe for Dr. Nicholas Lose.  I have reviewed the above documentation for accuracy and completeness, and I agree with the above.

## 2019-04-16 NOTE — Telephone Encounter (Signed)
Added iron infusion time 4 starting 12/4. Confirmed with patient and patient will get updated schedule at visit tomorrow.

## 2019-04-17 ENCOUNTER — Inpatient Hospital Stay: Payer: Medicare Other | Attending: Hematology and Oncology | Admitting: Hematology and Oncology

## 2019-04-17 ENCOUNTER — Other Ambulatory Visit: Payer: Self-pay

## 2019-04-17 DIAGNOSIS — C50411 Malignant neoplasm of upper-outer quadrant of right female breast: Secondary | ICD-10-CM

## 2019-04-17 DIAGNOSIS — D509 Iron deficiency anemia, unspecified: Secondary | ICD-10-CM | POA: Insufficient documentation

## 2019-04-17 DIAGNOSIS — Z171 Estrogen receptor negative status [ER-]: Secondary | ICD-10-CM | POA: Diagnosis not present

## 2019-04-17 NOTE — Patient Outreach (Signed)
[facility] Evaluation Interviewer attempted to call patient on today regarding Aging Gracefully referral.   CMA left confidential voicemail in hopes of reaching either the patient or the spouse. CMA will follow up in 1 week.  [facility] Management Assistant

## 2019-04-17 NOTE — Assessment & Plan Note (Signed)
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 Patient received blood transfusion at [place] given on 02/17/2017, 11/22/2017, January 2020  Lab review: 04/15/2019: Hemoglobin 7.9, MCV 74.7, RDW 15.9, ferritin 27, iron saturation 3%, TIBC 456 Recommendation: IV iron with Venofer  Recheck labs and follow-up in 2 months

## 2019-04-17 NOTE — Assessment & Plan Note (Signed)
Diagnosed 2016 treated with lumpectomy, Taxotere/Cytoxan as well as radiation therapy, triple negative  Breast cancer surveillance: 1.Breast exam 05/18/2018: Benign 2.Mammogram and ultrasound 10/17/2018 no mammographic evidence of malignancy, breast density category C

## 2019-04-19 ENCOUNTER — Inpatient Hospital Stay: Payer: Medicare Other

## 2019-04-19 ENCOUNTER — Other Ambulatory Visit: Payer: Self-pay

## 2019-04-19 VITALS — BP 123/66 | HR 63 | Temp 98.6°F | Resp 18

## 2019-04-19 DIAGNOSIS — D508 Other iron deficiency anemias: Secondary | ICD-10-CM

## 2019-04-19 DIAGNOSIS — D509 Iron deficiency anemia, unspecified: Secondary | ICD-10-CM | POA: Diagnosis not present

## 2019-04-19 MED ORDER — ACETAMINOPHEN 325 MG PO TABS
650.0000 mg | ORAL_TABLET | Freq: Once | ORAL | Status: AC
  Administered 2019-04-19: 650 mg via ORAL

## 2019-04-19 MED ORDER — SODIUM CHLORIDE 0.9 % IV SOLN
Freq: Once | INTRAVENOUS | Status: AC
  Administered 2019-04-19: 08:00:00 via INTRAVENOUS
  Filled 2019-04-19: qty 250

## 2019-04-19 MED ORDER — SODIUM CHLORIDE 0.9 % IV SOLN
200.0000 mg | Freq: Once | INTRAVENOUS | Status: AC
  Administered 2019-04-19: 09:00:00 200 mg via INTRAVENOUS
  Filled 2019-04-19: qty 10

## 2019-04-19 MED ORDER — SODIUM CHLORIDE 0.9 % IV SOLN
200.0000 mg | Freq: Once | INTRAVENOUS | Status: DC
  Filled 2019-04-19: qty 10

## 2019-04-19 MED ORDER — ACETAMINOPHEN 325 MG PO TABS
ORAL_TABLET | ORAL | Status: AC
  Filled 2019-04-19: qty 2

## 2019-04-19 NOTE — Telephone Encounter (Signed)
Phone now has voicemail, I called and left message.

## 2019-04-19 NOTE — Patient Instructions (Signed)

## 2019-04-21 IMAGING — DX DG CHEST 2V
2 series · 2 of 2 positions shown · non-contrast
Comparison: 03/23/2016

CLINICAL DATA: Chest pain

EXAM:
CHEST - 2 VIEW

[chest lat]
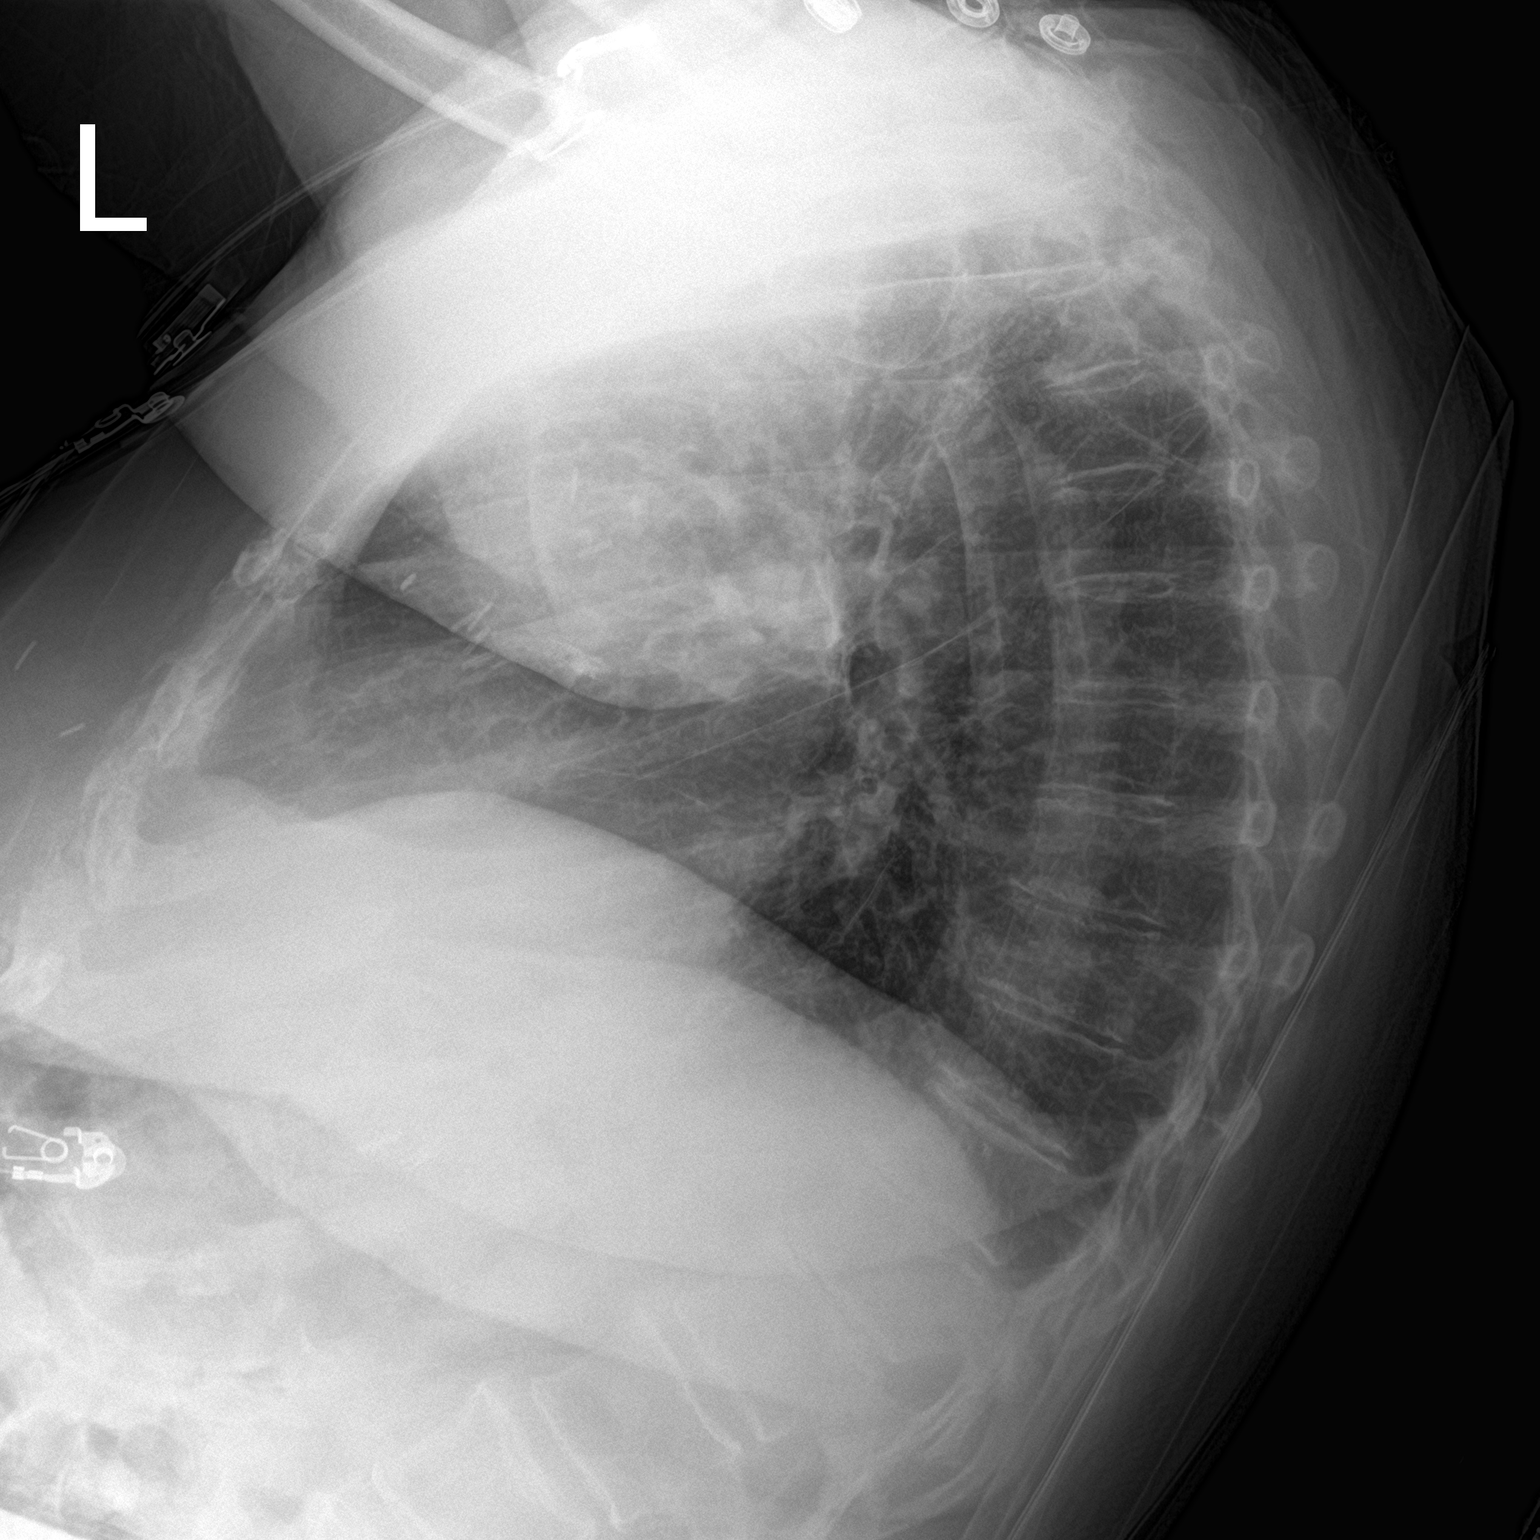

[chest ap]
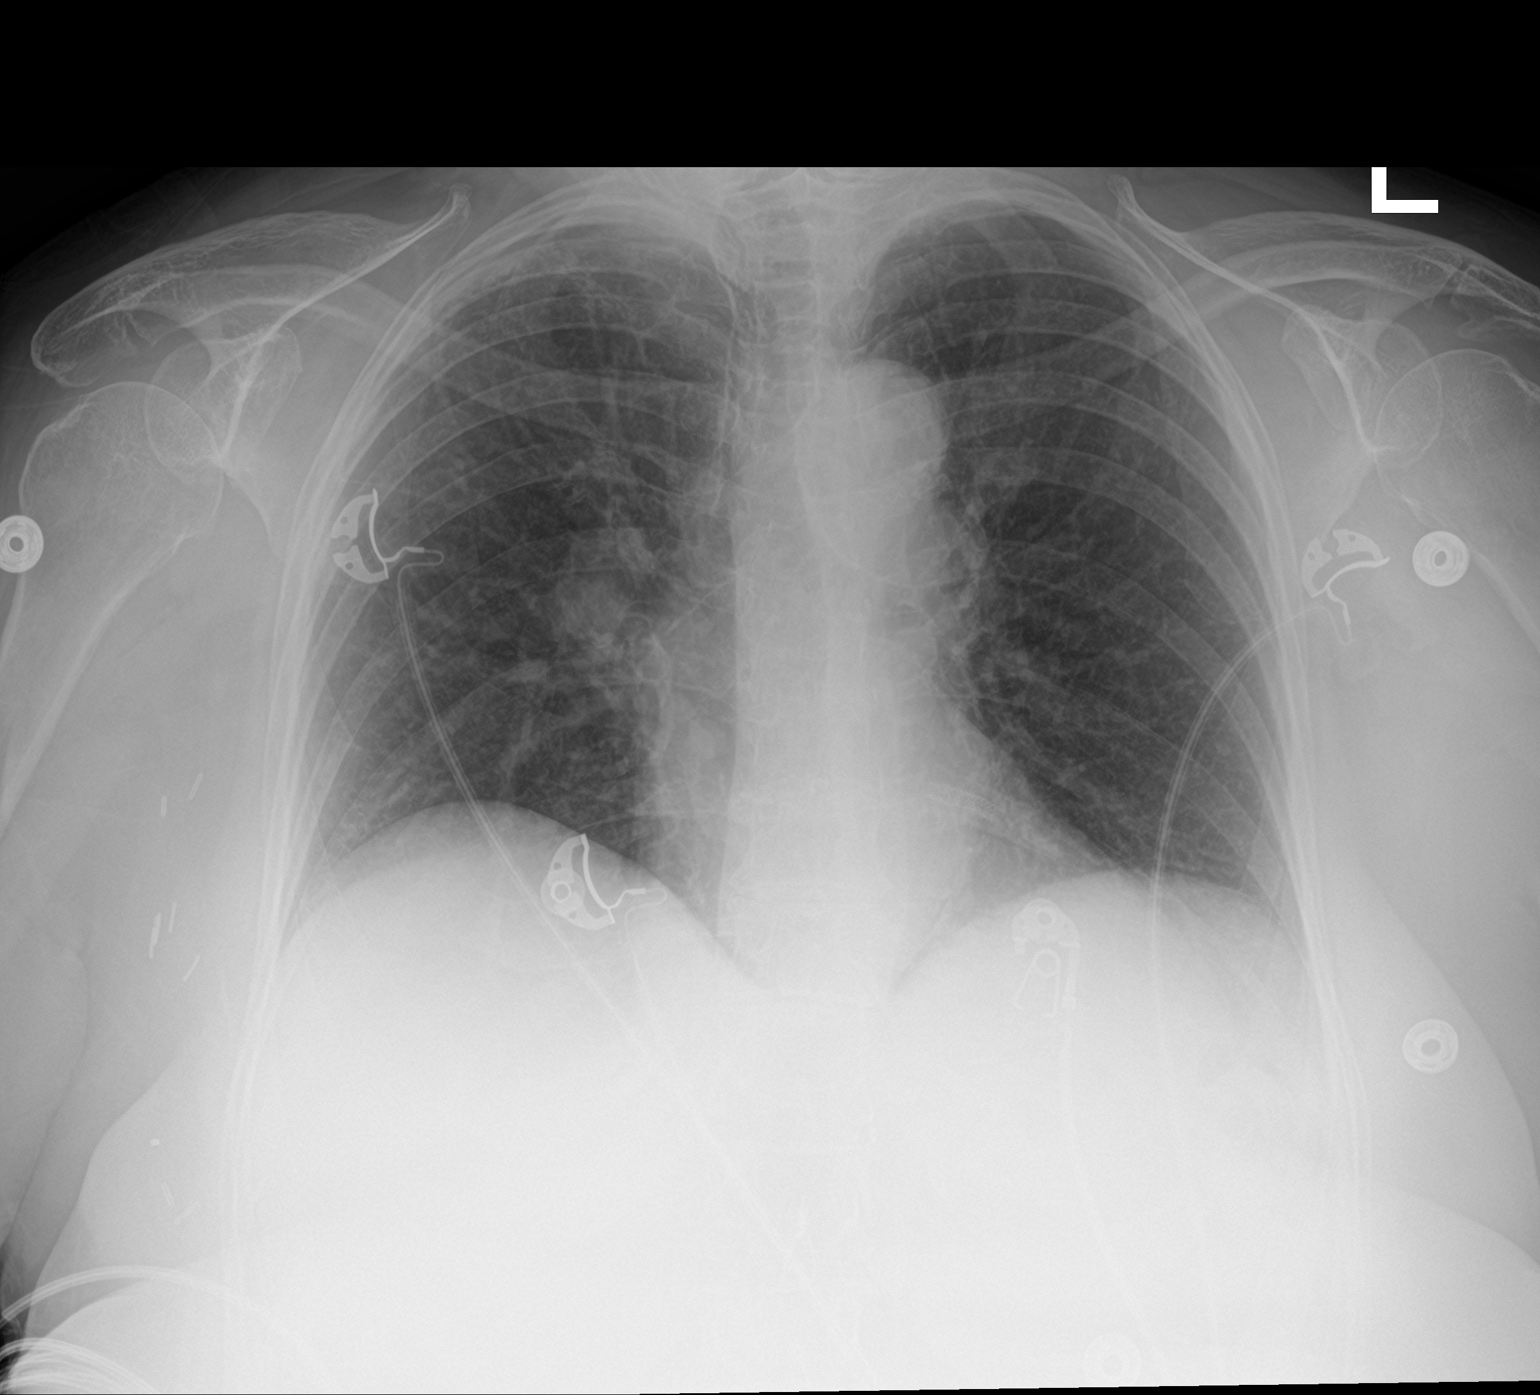

[2 of 2 positions shown; findings below may reference images not displayed]

FINDINGS: Mildly low lung volumes. No focal consolidation or pleural effusion.
Stable cardiomediastinal silhouette. Aortic atherosclerosis. No
pneumothorax. Surgical clips over the right breast.
IMPRESSION: No active cardiopulmonary disease.  Low lung volumes.

## 2019-04-25 NOTE — Telephone Encounter (Signed)
Patient informed, transferred to appointment desk to schedule.

## 2019-04-26 ENCOUNTER — Inpatient Hospital Stay: Payer: Medicare Other

## 2019-04-26 ENCOUNTER — Other Ambulatory Visit: Payer: Self-pay

## 2019-04-26 VITALS — BP 117/74 | HR 63 | Temp 98.7°F | Resp 20

## 2019-04-26 DIAGNOSIS — D508 Other iron deficiency anemias: Secondary | ICD-10-CM

## 2019-04-26 DIAGNOSIS — D509 Iron deficiency anemia, unspecified: Secondary | ICD-10-CM | POA: Diagnosis not present

## 2019-04-26 MED ORDER — SODIUM CHLORIDE 0.9 % IV SOLN
200.0000 mg | Freq: Once | INTRAVENOUS | Status: AC
  Administered 2019-04-26: 200 mg via INTRAVENOUS
  Filled 2019-04-26: qty 10

## 2019-04-26 MED ORDER — ACETAMINOPHEN 325 MG PO TABS
650.0000 mg | ORAL_TABLET | Freq: Once | ORAL | Status: AC
  Administered 2019-04-26: 650 mg via ORAL

## 2019-04-26 MED ORDER — ACETAMINOPHEN 325 MG PO TABS
ORAL_TABLET | ORAL | Status: AC
  Filled 2019-04-26: qty 2

## 2019-04-26 MED ORDER — SODIUM CHLORIDE 0.9 % IV SOLN
Freq: Once | INTRAVENOUS | Status: AC
  Administered 2019-04-26: 14:00:00 via INTRAVENOUS
  Filled 2019-04-26: qty 250

## 2019-04-26 NOTE — Patient Instructions (Signed)

## 2019-05-03 ENCOUNTER — Other Ambulatory Visit: Payer: Self-pay

## 2019-05-03 ENCOUNTER — Inpatient Hospital Stay: Payer: Medicare Other

## 2019-05-03 VITALS — BP 125/71 | HR 64 | Temp 98.9°F | Resp 18

## 2019-05-03 DIAGNOSIS — D509 Iron deficiency anemia, unspecified: Secondary | ICD-10-CM | POA: Diagnosis not present

## 2019-05-03 DIAGNOSIS — D508 Other iron deficiency anemias: Secondary | ICD-10-CM

## 2019-05-03 MED ORDER — ACETAMINOPHEN 325 MG PO TABS
ORAL_TABLET | ORAL | Status: AC
  Filled 2019-05-03: qty 2

## 2019-05-03 MED ORDER — ACETAMINOPHEN 325 MG PO TABS
650.0000 mg | ORAL_TABLET | Freq: Once | ORAL | Status: AC
  Administered 2019-05-03: 650 mg via ORAL

## 2019-05-03 MED ORDER — SODIUM CHLORIDE 0.9 % IV SOLN
Freq: Once | INTRAVENOUS | Status: AC
  Filled 2019-05-03: qty 250

## 2019-05-03 MED ORDER — SODIUM CHLORIDE 0.9 % IV SOLN
200.0000 mg | Freq: Once | INTRAVENOUS | Status: AC
  Administered 2019-05-03: 200 mg via INTRAVENOUS
  Filled 2019-05-03: qty 10

## 2019-05-03 NOTE — Patient Outreach (Signed)
[facility] Evaluation Interviewer attempted to call patient on today regarding Aging Gracefully referral. Patient answered and CMA successfully scheduled initial interview for 05/14/2019.  [facility] Management Assistant

## 2019-05-03 NOTE — Patient Instructions (Signed)

## 2019-05-07 ENCOUNTER — Other Ambulatory Visit: Payer: Self-pay

## 2019-05-08 ENCOUNTER — Encounter: Payer: Self-pay | Admitting: Gynecology

## 2019-05-08 ENCOUNTER — Ambulatory Visit (INDEPENDENT_AMBULATORY_CARE_PROVIDER_SITE_OTHER): Payer: Medicare Other | Admitting: Gynecology

## 2019-05-08 VITALS — BP 120/70

## 2019-05-08 DIAGNOSIS — M8580 Other specified disorders of bone density and structure, unspecified site: Secondary | ICD-10-CM | POA: Diagnosis not present

## 2019-05-08 DIAGNOSIS — R21 Rash and other nonspecific skin eruption: Secondary | ICD-10-CM

## 2019-05-08 LAB — VITAMIN D 25 HYDROXY (VIT D DEFICIENCY, FRACTURES): Vit D, 25-Hydroxy: 66 ng/mL (ref 30–100)

## 2019-05-08 MED ORDER — TRIAMCINOLONE ACETONIDE 0.1 % EX CREA: 1.0000 "application " | TOPICAL_CREAM | Freq: Two times a day (BID) | CUTANEOUS | 4 refills | Status: DC

## 2019-05-08 MED ORDER — ALENDRONATE SODIUM 70 MG PO TABS: 70.0000 mg | ORAL_TABLET | ORAL | 11 refills | Status: DC

## 2019-05-08 MED ORDER — NYSTATIN 100000 UNIT/GM EX CREA: 1.0000 "application " | TOPICAL_CREAM | Freq: Two times a day (BID) | CUTANEOUS | 4 refills | Status: DC

## 2019-05-08 NOTE — Progress Notes (Signed)
    Angela Bond 1945-08-19 [id number]        73 y.o.  G1P1 presents to discuss 2 issues:  1. Persistent rash on her stomach 2. Recent DEXA showing T score -2 FRAX 18.8% / 4.3%  Past medical history,surgical history, problem list, medications, allergies, family history and social history were all reviewed and documented in the EPIC chart.  Directed ROS with pertinent positives and negatives documented in the history of present illness/assessment and plan.  Exam: Vitals:   05/08/19 1024  BP: 120/70   General appearance:  Normal Abdomen with classic fungal rash in the panniculus.  Assessment/Plan:  73 y.o. G1P1 with:  1. Classic fungal rash in her panniculus.  Recommend nystatin cream and triamcinolone 0.1% cream applied twice daily.  30 g tubes for each prescribed with 4 refills.  Follow-up if continues. 2. Osteopenia with elevated FRAX.  Reviewed her past densities and at [facility] she was a -2.1 in 2014 at the left femoral neck where she is -2.0 on recent scan.  We discussed her bone density report in detail and reviewed osteopenia/osteoporosis.  We discussed her increased fracture risk and the potential for devastating sequela.  Options for management to include observation, weightbearing exercise, maximizing calcium vitamin D and rechecking a study in 2 years versus starting on medication now.  Benefits and risks of medication were reviewed to include GERD, osteonecrosis of the jaw and atypical fractures.  In review of her chart she apparently had tried alendronate in the past but had leg cramps.  Alternatives to include Prolia, monthly bisphosphate or IV bisphosphate reviewed.  Patient would like a retrial of alendronate as she really does not remember having issues before.  Will call if she experiences leg cramps.  She has an appointment with Dr. Delilah Shan end of January and will follow-up at that time regardless.  Prescription for alendronate 70 mg weekly provided.  How to take the  medication and side effects to look out for were reviewed.  Will check baseline vitamin D level today as there is none documented in her chart.    Anastasio Auerbach MD, 10:47 AM 05/08/2019

## 2019-05-08 NOTE — Patient Instructions (Signed)
Apply the 2 prescribed creams to the rash on the abdomen.  Follow-up if the rash persists.  Start on the alendronate medication for the bones.  Call if any side effects or issues with this.

## 2019-05-09 ENCOUNTER — Other Ambulatory Visit: Payer: Self-pay

## 2019-05-09 ENCOUNTER — Inpatient Hospital Stay: Payer: Medicare Other

## 2019-05-09 VITALS — BP 129/63 | HR 65 | Temp 98.6°F | Resp 16

## 2019-05-09 DIAGNOSIS — D509 Iron deficiency anemia, unspecified: Secondary | ICD-10-CM | POA: Diagnosis not present

## 2019-05-09 DIAGNOSIS — D508 Other iron deficiency anemias: Secondary | ICD-10-CM

## 2019-05-09 MED ORDER — SODIUM CHLORIDE 0.9 % IV SOLN
200.0000 mg | Freq: Once | INTRAVENOUS | Status: AC
  Administered 2019-05-09: 200 mg via INTRAVENOUS
  Filled 2019-05-09: qty 200

## 2019-05-09 MED ORDER — ACETAMINOPHEN 325 MG PO TABS
ORAL_TABLET | ORAL | Status: AC
  Filled 2019-05-09: qty 2

## 2019-05-09 MED ORDER — SODIUM CHLORIDE 0.9 % IV SOLN
Freq: Once | INTRAVENOUS | Status: AC
  Filled 2019-05-09: qty 250

## 2019-05-09 MED ORDER — ACETAMINOPHEN 325 MG PO TABS
650.0000 mg | ORAL_TABLET | Freq: Once | ORAL | Status: AC
  Administered 2019-05-09: 650 mg via ORAL

## 2019-05-09 NOTE — Patient Instructions (Signed)

## 2019-05-14 ENCOUNTER — Other Ambulatory Visit: Payer: Self-pay

## 2019-05-14 NOTE — Patient Outreach (Signed)
[facility] Evaluation Interviewer made contact with patient. Aging Gracefully survey completed.   Interviewer will send referral to OT and Amanda Cook, RN for follow up.   Angela Bond Care Management Assistant 

## 2019-05-22 ENCOUNTER — Other Ambulatory Visit: Payer: Self-pay | Admitting: Specialist

## 2019-05-22 ENCOUNTER — Inpatient Hospital Stay: Payer: Medicare Other | Attending: Hematology and Oncology

## 2019-05-22 ENCOUNTER — Other Ambulatory Visit: Payer: Self-pay

## 2019-05-22 DIAGNOSIS — D509 Iron deficiency anemia, unspecified: Secondary | ICD-10-CM | POA: Insufficient documentation

## 2019-05-22 DIAGNOSIS — Z9221 Personal history of antineoplastic chemotherapy: Secondary | ICD-10-CM | POA: Diagnosis not present

## 2019-05-22 DIAGNOSIS — Z79899 Other long term (current) drug therapy: Secondary | ICD-10-CM | POA: Insufficient documentation

## 2019-05-22 DIAGNOSIS — C50411 Malignant neoplasm of upper-outer quadrant of right female breast: Secondary | ICD-10-CM | POA: Diagnosis not present

## 2019-05-22 DIAGNOSIS — Z923 Personal history of irradiation: Secondary | ICD-10-CM | POA: Diagnosis not present

## 2019-05-22 DIAGNOSIS — M8580 Other specified disorders of bone density and structure, unspecified site: Secondary | ICD-10-CM | POA: Insufficient documentation

## 2019-05-22 DIAGNOSIS — Z171 Estrogen receptor negative status [ER-]: Secondary | ICD-10-CM | POA: Diagnosis not present

## 2019-05-22 DIAGNOSIS — Z7982 Long term (current) use of aspirin: Secondary | ICD-10-CM | POA: Diagnosis not present

## 2019-05-22 LAB — CBC WITH DIFFERENTIAL (CANCER CENTER ONLY)
Abs Immature Granulocytes: 0.02 10*3/uL (ref 0.00–0.07)
Basophils Absolute: 0 10*3/uL (ref 0.0–0.1)
Basophils Relative: 1 %
Eosinophils Absolute: 0.5 10*3/uL (ref 0.0–0.5)
Eosinophils Relative: 7 %
HCT: 37.7 % (ref 36.0–46.0)
Hemoglobin: 11.2 g/dL — ABNORMAL LOW (ref 12.0–15.0)
Immature Granulocytes: 0 %
Lymphocytes Relative: 22 %
Lymphs Abs: 1.5 10*3/uL (ref 0.7–4.0)
MCH: 23.9 pg — ABNORMAL LOW (ref 26.0–34.0)
MCHC: 29.7 g/dL — ABNORMAL LOW (ref 30.0–36.0)
MCV: 80.6 fL (ref 80.0–100.0)
Monocytes Absolute: 0.5 10*3/uL (ref 0.1–1.0)
Monocytes Relative: 8 %
Neutro Abs: 4.4 10*3/uL (ref 1.7–7.7)
Neutrophils Relative %: 62 %
Platelet Count: 207 10*3/uL (ref 150–400)
RBC: 4.68 MIL/uL (ref 3.87–5.11)
RDW: 25.8 % — ABNORMAL HIGH (ref 11.5–15.5)
WBC Count: 7 10*3/uL (ref 4.0–10.5)
nRBC: 0 % (ref 0.0–0.2)

## 2019-05-22 LAB — IRON AND TIBC
Iron: 63 ug/dL (ref 41–142)
Saturation Ratios: 20 % — ABNORMAL LOW (ref 21–57)
TIBC: 318 ug/dL (ref 236–444)
UIBC: 254 ug/dL (ref 120–384)

## 2019-05-22 LAB — FERRITIN: Ferritin: 128 ng/mL (ref 11–307)

## 2019-05-23 ENCOUNTER — Encounter: Payer: Medicare Other | Admitting: Gynecology

## 2019-05-23 NOTE — Progress Notes (Signed)
Patient Care Team: Gaynelle Arabian, MD as PCP - General (Family Medicine) Jerline Pain, MD as PCP - Cardiology (Cardiology) Wonda Horner, MD as Consulting Physician (Gastroenterology) Ralene Ok, MD as Consulting Physician (Surgery) Nicholas Lose, MD as Consulting Physician (Hematology and Oncology) Sylvan Cheese, NP as Nurse Practitioner (Hematology and Oncology) Jovita Kussmaul, MD as Consulting Physician (General Surgery) Thea Silversmith, MD as Consulting Physician (Radiation Oncology)  DIAGNOSIS:    ICD-10-CM   1. Malignant neoplasm of upper-outer quadrant of right breast in female, estrogen receptor negative ([place])  C50.411    Z17.1     SUMMARY OF ONCOLOGIC HISTORY: Oncology History  Breast cancer of upper-outer quadrant of right female breast ([place])  07/30/2014 Mammogram   Right breast: Mass at 10 o'clock location 4 cm from the nipple measuring 1.7 x 1.3 x 1.3 cm. This corresponds to the palpable and mammographic finding. In the right axilla, there are several lymph nodes which are normal in size measuring up to 64m.   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%), PR- (0%), HER2/neu negative, Ki67 30%   08/27/2014 Clinical Stage   Stage IIA: T2 N0   09/22/2014 Definitive Surgery   Right lumpectomy/SLNB (Marlou Starks: Invasive ductal carcinoma, grade 3, 2.6 cm, high grade DCIS, LVI present, ER-, PR-, 2 LN removed and 1 positive for metastatic carcinoma (1/2)   09/22/2014 Pathologic Stage   Stage IIB: T2 N1 M0   10/15/2014 - 12/16/2014 Chemotherapy   Adjuvant chemotherapy with Taxotere Cytoxan 4 q3 weeks   10/21/2014 - 10/24/2014 Hospital Admission   Admission for neutropenic fever UTI Klebsiella   01/26/2015 - 03/11/2015 Radiation Therapy   Adjuvant RT (Pablo Ledger: Right breast / 45 Gray @ 1.8 GPearline Cablesper fraction x 25 fractions Right supraclavicular fossa/PAB 45 Gy _0 .8 Gy per fraction x 25 fractions  Right breast boost / 16 Gray at 2[employer]per fraction x 8 fractions   05/19/2015 Survivorship   Survivorship care plan visit completed.   03/28/2018 Surgery   Laparoscopic cholecystectomy     CHIEF COMPLIANT: Follow-up of breast cancer and iron deficiency anemia  INTERVAL HISTORY: Angela MAJEWSKIis a 74y.o. with above-mentioned history of right breast cancer treated with lumpectomy,adjuvant chemotherapy,and radiationwhois currently on surveillance. She also has a history of iron deficiency anemia previously treated with IV iron. Labs on 05/21/18 showed Hg 11.2, HCT 37.7, MCV 80.6, iron saturation 20%, TIBC 318, ferritin 128. She presents to the clinic today for follow-up.  She continues to have mild to moderate fatigue issues although overall her energy levels have improved.  She tells me that her husband has advanced Alzheimer's and because of them she is unable to sleep  ALLERGIES:  is allergic to shellfish allergy; aspirin; penicillins; aleve [naproxen]; brilinta [ticagrelor]; ciprofloxacin; doxycycline; evista [raloxifene]; fosamax [alendronate sodium]; lactose intolerance (gi); latex; naproxen sodium; nitrofurantoin; pravachol [pravastatin]; sulfa antibiotics; tape; welchol [colesevelam hcl]; and zocor [simvastatin].  MEDICATIONS:  Current Outpatient Medications  Medication Sig Dispense Refill  . acetaminophen (TYLENOL) 500 MG tablet Take 1 tablet (500 mg total) by mouth every 6 (six) hours as needed for moderate pain or headache. 30 tablet 0  . alendronate (FOSAMAX) 70 MG tablet Take 1 tablet (70 mg total) by mouth every 7 (seven) days. Take with a full glass of water on an empty stomach. 4 tablet 11  . aspirin EC 81 MG EC tablet Take 1 tablet (81 mg total) by  mouth daily.    . Cholecalciferol (VITAMIN D) 2000 UNITS tablet Take 2,000 Units by mouth 2 (two) times daily.     Marland Kitchen dexlansoprazole (DEXILANT) 60 MG capsule Take 60 mg by mouth daily.    Marland Kitchen docusate sodium (COLACE) 100 MG capsule  Take 1 capsule (100 mg total) by mouth 2 (two) times daily. 10 capsule 1  . erythromycin ophthalmic ointment Place 1 application into the right eye 4 (four) times daily.    Marland Kitchen escitalopram (LEXAPRO) 20 MG tablet Take 20 mg by mouth daily.    Marland Kitchen ezetimibe (ZETIA) 10 MG tablet Take 1 tablet (10 mg total) by mouth at bedtime. 90 tablet 3  . famotidine (PEPCID) 20 MG tablet Take 20 mg by mouth at bedtime.     . fexofenadine (ALLEGRA) 180 MG tablet Take 180 mg by mouth daily as needed for allergies.     . furosemide (LASIX) 20 MG tablet Take one tablet daily as needed for 3 lbs over night weight gain 30 tablet 3  . gabapentin (NEURONTIN) 600 MG tablet Take 1 tablet (600 mg total) by mouth 3 (three) times daily. 90 tablet 0  . LORazepam (ATIVAN) 1 MG tablet Take 1 mg by mouth at bedtime.     . lovastatin (MEVACOR) 40 MG tablet TAKE 1 TABLET BY MOUTH AT  BEDTIME 90 tablet 2  . Magnesium 500 MG TABS Take 500 mg by mouth at bedtime.    . meclizine (ANTIVERT) 25 MG tablet Take 25 mg by mouth 3 (three) times daily as needed for dizziness.     . montelukast (SINGULAIR) 10 MG tablet Take 10 mg by mouth at bedtime.    . mupirocin ointment (BACTROBAN) 2 % Place 1 application into the nose 2 (two) times daily. 22 g 0  . nitroGLYCERIN (NITROSTAT) 0.4 MG SL tablet Place 1 tablet (0.4 mg total) under the tongue every 5 (five) minutes as needed for chest pain (CP or SOB). 25 tablet 6  . nystatin cream (MYCOSTATIN) Apply 1 application topically 2 (two) times daily. 30 g 4  . ondansetron (ZOFRAN) 4 MG tablet Take 1 tablet (4 mg total) by mouth every 6 (six) hours as needed for nausea or vomiting. 20 tablet 0  . oxyCODONE-acetaminophen (PERCOCET/ROXICET) 5-325 MG tablet Take 1-2 tablets by mouth every 6 (six) hours as needed for moderate pain or severe pain. (Patient not taking: Reported on 05/08/2019) 50 tablet 0  . Polyvinyl Alcohol-Povidone PF (REFRESH) 1.4-0.6 % SOLN Place 2 drops into both eyes 3 (three) times daily  as needed (for dry eyes).    . potassium chloride (K-DUR) 10 MEQ tablet Take 10 mEq by mouth every 3 (three) days.    Marland Kitchen triamcinolone cream (KENALOG) 0.1 % Apply 1 application topically 2 (two) times daily. 30 g 4  . vitamin C (ASCORBIC ACID) 500 MG tablet Take 500 mg by mouth daily.     No current facility-administered medications for this visit.    PHYSICAL EXAMINATION: ECOG PERFORMANCE STATUS: 1 - Symptomatic but completely ambulatory  There were no vitals filed for this visit. There were no vitals filed for this visit.  LABORATORY DATA:  I have reviewed the data as listed CMP Latest Ref Rng & Units 03/26/2019 12/25/2018 12/13/2018  Glucose 65 - 99 mg/dL 91 88 97  BUN 8 - 27 mg/dL 11 7(L) 11  Creatinine 0.57 - 1.00 mg/dL 0.85 0.78 0.71  Sodium 134 - 144 mmol/L 139 140 138  Potassium 3.5 - 5.2 mmol/L 4.6  4.3 3.5  Chloride 96 - 106 mmol/L 99 103 101  CO2 20 - 29 mmol/L _0 Calcium 8.7 - 10.3 mg/dL 9.0 9.0 8.4(L)  Total Protein 6.0 - 8.5 g/dL 7.1 7.6 6.4(L)  Total Bilirubin 0.0 - 1.2 mg/dL 0.4 1.0 0.9  Alkaline Phos 39 - 117 IU/L 91 92 82  AST 0 - 40 IU/L _1 ALT 0 - 32 IU/L _2 Lab Results  Component Value Date   WBC 7.0 05/22/2019   HGB 11.2 (L) 05/22/2019   HCT 37.7 05/22/2019   MCV 80.6 05/22/2019   PLT 207 05/22/2019   NEUTROABS 4.4 05/22/2019    ASSESSMENT & PLAN:  Breast cancer of upper-outer quadrant of right female breast ([place]) Diagnosed 2016 treated with lumpectomy, Taxotere/Cytoxan as well as radiation therapy, triple negative  Breast cancer surveillance: 1.Breast exam 05/24/2019: Benign no palpable lumps or nodules 2.Mammogram and ultrasound6/07/2018 no mammographic evidence of malignancy, breast density category C 3.  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  Recheck labs and follow-up in 6 months    No orders of the defined types were placed in this encounter.  The patient has a good understanding of the overall plan. she agrees with it. she will call with any problems that may develop before the next visit here.  Total time spent: 30 mins including face to face time and time spent for planning, charting and coordination of care  Nicholas Lose, MD 05/24/2019  I, Cloyde Reams Dorshimer, am acting as scribe for Dr. Nicholas Lose.  I have reviewed the above documentation for accuracy and completeness, and I agree with the above.

## 2019-05-24 ENCOUNTER — Inpatient Hospital Stay: Payer: Medicare Other

## 2019-05-24 ENCOUNTER — Other Ambulatory Visit: Payer: Self-pay

## 2019-05-24 ENCOUNTER — Inpatient Hospital Stay: Payer: Medicare Other | Admitting: Hematology and Oncology

## 2019-05-24 DIAGNOSIS — C50411 Malignant neoplasm of upper-outer quadrant of right female breast: Secondary | ICD-10-CM

## 2019-05-24 DIAGNOSIS — Z171 Estrogen receptor negative status [ER-]: Secondary | ICD-10-CM | POA: Diagnosis not present

## 2019-05-24 NOTE — Assessment & Plan Note (Signed)
Diagnosed 2016 treated with lumpectomy, Taxotere/Cytoxan as well as radiation therapy, triple negative  Breast cancer surveillance: 1.Breast exam 05/24/2019: Benign no palpable lumps or nodules 2.Mammogram and ultrasound6/07/2018 no mammographic evidence of malignancy, breast density category C 3.  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

## 2019-06-05 ENCOUNTER — Encounter: Payer: Medicare Other | Admitting: Obstetrics and Gynecology

## 2019-07-31 ENCOUNTER — Ambulatory Visit: Payer: Medicare Other | Admitting: Nurse Practitioner

## 2019-08-09 ENCOUNTER — Other Ambulatory Visit: Payer: Self-pay | Admitting: Hematology and Oncology

## 2019-08-09 DIAGNOSIS — Z1231 Encounter for screening mammogram for malignant neoplasm of breast: Secondary | ICD-10-CM

## 2019-10-18 ENCOUNTER — Encounter: Payer: Self-pay | Admitting: Podiatry

## 2019-10-18 ENCOUNTER — Other Ambulatory Visit: Payer: Self-pay

## 2019-10-18 ENCOUNTER — Ambulatory Visit (INDEPENDENT_AMBULATORY_CARE_PROVIDER_SITE_OTHER): Payer: Medicare Other

## 2019-10-18 ENCOUNTER — Ambulatory Visit: Payer: Medicare Other | Admitting: Podiatry

## 2019-10-18 DIAGNOSIS — J3489 Other specified disorders of nose and nasal sinuses: Secondary | ICD-10-CM | POA: Insufficient documentation

## 2019-10-18 DIAGNOSIS — Y92009 Unspecified place in unspecified non-institutional (private) residence as the place of occurrence of the external cause: Secondary | ICD-10-CM | POA: Insufficient documentation

## 2019-10-18 DIAGNOSIS — M7661 Achilles tendinitis, right leg: Secondary | ICD-10-CM

## 2019-10-18 DIAGNOSIS — M7751 Other enthesopathy of right foot: Secondary | ICD-10-CM | POA: Diagnosis not present

## 2019-10-18 DIAGNOSIS — W19XXXA Unspecified fall, initial encounter: Secondary | ICD-10-CM | POA: Insufficient documentation

## 2019-10-18 MED ORDER — METHYLPREDNISOLONE 4 MG PO TBPK: ORAL_TABLET | ORAL | 0 refills | Status: DC

## 2019-10-18 NOTE — Progress Notes (Signed)
She presents today states the posterior aspect of the right heel is painful.  She has been aching now for several months is swollen a lot of the time and hurts with pressure being on it.  Objective: Vital signs are stable alert oriented x3.  There is no erythema cellulitis drainage or odor she does have fluctuance on palpation of the posterior aspect of the Achilles at the superior margin of the calcaneus posteriorly.  Radiographs taken today do demonstrate soft tissue swelling in this area.  Assessment: Most likely insertional Achilles tendinitis right with bursitis.  Plan: I injected the area today with 2 mg of dexamethasone and local anesthetic start her on methylprednisolone to be followed by meloxicam.  And dispensed a night splint.  Also recommended icing and I will follow-up with her in about a month

## 2019-11-12 ENCOUNTER — Other Ambulatory Visit: Payer: Self-pay | Admitting: Hematology and Oncology

## 2019-11-12 DIAGNOSIS — Z853 Personal history of malignant neoplasm of breast: Secondary | ICD-10-CM

## 2019-11-15 ENCOUNTER — Other Ambulatory Visit: Payer: Self-pay | Admitting: *Deleted

## 2019-11-15 DIAGNOSIS — Z171 Estrogen receptor negative status [ER-]: Secondary | ICD-10-CM

## 2019-11-19 ENCOUNTER — Encounter: Payer: Self-pay | Admitting: Podiatry

## 2019-11-19 ENCOUNTER — Ambulatory Visit: Payer: Medicare Other | Admitting: Podiatry

## 2019-11-19 ENCOUNTER — Inpatient Hospital Stay: Payer: Medicare Other | Attending: Hematology and Oncology

## 2019-11-19 ENCOUNTER — Other Ambulatory Visit: Payer: Self-pay

## 2019-11-19 DIAGNOSIS — K909 Intestinal malabsorption, unspecified: Secondary | ICD-10-CM | POA: Insufficient documentation

## 2019-11-19 DIAGNOSIS — Z853 Personal history of malignant neoplasm of breast: Secondary | ICD-10-CM | POA: Insufficient documentation

## 2019-11-19 DIAGNOSIS — Z79899 Other long term (current) drug therapy: Secondary | ICD-10-CM | POA: Insufficient documentation

## 2019-11-19 DIAGNOSIS — B351 Tinea unguium: Secondary | ICD-10-CM | POA: Diagnosis not present

## 2019-11-19 DIAGNOSIS — Z923 Personal history of irradiation: Secondary | ICD-10-CM | POA: Diagnosis not present

## 2019-11-19 DIAGNOSIS — L600 Ingrowing nail: Secondary | ICD-10-CM

## 2019-11-19 DIAGNOSIS — M79676 Pain in unspecified toe(s): Secondary | ICD-10-CM | POA: Diagnosis not present

## 2019-11-19 DIAGNOSIS — D508 Other iron deficiency anemias: Secondary | ICD-10-CM | POA: Insufficient documentation

## 2019-11-19 DIAGNOSIS — Z171 Estrogen receptor negative status [ER-]: Secondary | ICD-10-CM | POA: Diagnosis not present

## 2019-11-19 DIAGNOSIS — Z7952 Long term (current) use of systemic steroids: Secondary | ICD-10-CM | POA: Insufficient documentation

## 2019-11-19 DIAGNOSIS — Z7982 Long term (current) use of aspirin: Secondary | ICD-10-CM | POA: Diagnosis not present

## 2019-11-19 DIAGNOSIS — Z9221 Personal history of antineoplastic chemotherapy: Secondary | ICD-10-CM | POA: Insufficient documentation

## 2019-11-19 DIAGNOSIS — M7661 Achilles tendinitis, right leg: Secondary | ICD-10-CM

## 2019-11-19 DIAGNOSIS — M8580 Other specified disorders of bone density and structure, unspecified site: Secondary | ICD-10-CM | POA: Insufficient documentation

## 2019-11-19 DIAGNOSIS — M7751 Other enthesopathy of right foot: Secondary | ICD-10-CM

## 2019-11-19 LAB — CMP (CANCER CENTER ONLY)
ALT: 11 U/L (ref 0–44)
AST: 15 U/L (ref 15–41)
Albumin: 3.4 g/dL — ABNORMAL LOW (ref 3.5–5.0)
Alkaline Phosphatase: 74 U/L (ref 38–126)
Anion gap: 9 (ref 5–15)
BUN: 9 mg/dL (ref 8–23)
CO2: 28 mmol/L (ref 22–32)
Calcium: 8.8 mg/dL — ABNORMAL LOW (ref 8.9–10.3)
Chloride: 103 mmol/L (ref 98–111)
Creatinine: 0.8 mg/dL (ref 0.44–1.00)
GFR, Est AFR Am: >60 mL/min (ref >60)
GFR, Estimated: >60 mL/min (ref >60)
Glucose, Bld: 123 mg/dL — ABNORMAL HIGH (ref 70–99)
Potassium: 4 mmol/L (ref 3.5–5.1)
Sodium: 140 mmol/L (ref 135–145)
Total Bilirubin: 0.4 mg/dL (ref 0.3–1.2)
Total Protein: 7.2 g/dL (ref 6.5–8.1)

## 2019-11-19 LAB — CBC WITH DIFFERENTIAL (CANCER CENTER ONLY)
Abs Immature Granulocytes: 0.02 10*3/uL (ref 0.00–0.07)
Basophils Absolute: 0 10*3/uL (ref 0.0–0.1)
Basophils Relative: 1 %
Eosinophils Absolute: 0.4 10*3/uL (ref 0.0–0.5)
Eosinophils Relative: 6 %
HCT: 38 % (ref 36.0–46.0)
Hemoglobin: 12.2 g/dL (ref 12.0–15.0)
Immature Granulocytes: 0 %
Lymphocytes Relative: 22 %
Lymphs Abs: 1.4 10*3/uL (ref 0.7–4.0)
MCH: 29.7 pg (ref 26.0–34.0)
MCHC: 32.1 g/dL (ref 30.0–36.0)
MCV: 92.5 fL (ref 80.0–100.0)
Monocytes Absolute: 0.4 10*3/uL (ref 0.1–1.0)
Monocytes Relative: 7 %
Neutro Abs: 4.2 10*3/uL (ref 1.7–7.7)
Neutrophils Relative %: 64 %
Platelet Count: 220 10*3/uL (ref 150–400)
RBC: 4.11 MIL/uL (ref 3.87–5.11)
RDW: 13.6 % (ref 11.5–15.5)
WBC Count: 6.5 10*3/uL (ref 4.0–10.5)
nRBC: 0 % (ref 0.0–0.2)

## 2019-11-19 MED ORDER — NEOMYCIN-POLYMYXIN-HC 1 % OT SOLN
OTIC | 1 refills | Status: DC
Start: 2019-11-19 — End: 2020-11-19

## 2019-11-19 NOTE — Progress Notes (Signed)
She presents today for follow-up of her bursitis posterior aspect of her right heel.  She states that he feels so much better but is just a small area that is painful and she points to the medial posterior aspect of her right heel.  She is also complaining of painful ingrown toenail hallux right.  Objective: Vital signs are stable alert oriented x3 pulses are palpable.  She has mild hallux valgus deformity with sharp incurvated nail margin along the tibiofibular border of the hallux right.  The nails are thickened the margins are mildly erythematous there is no purulence no malodor.  She has some tenderness on palpation of the posterior aspect of the calcaneus where there is a small nonpulsatile fluctuant mass consistent with bursitis posterior medial aspect of the right heel.  Pain in limb secondary to painful thick yellow dystrophic possibly mycotic nails 1 through 5 bilateral.  Assessment: Resolving bursitis of the Achilles right.  Ingrown toenail tibial to the border of the hallux right.  Mild hallux valgus right foot.  Painful elongated toenails 1 through 5 bilateral.  Plan: Discussed etiology pathology conservative surgical therapies at this point in time I recommended dexamethasone injection to the bursa once again.  Also recommended matrixectomy tibial and fibular border of the hallux right.  Tolerated procedure well without complications was provided with both oral and written home-going instructions for care and soaking of the toe as well as a prescription for Corticosporin otic to be applied twice daily after soaking.  Debridement of toenails 1 through 5 bilateral.

## 2019-11-19 NOTE — Patient Instructions (Signed)

## 2019-11-20 NOTE — Progress Notes (Signed)
Patient Care Team: Gaynelle Arabian, MD as PCP - General (Family Medicine) Jerline Pain, MD as PCP - Cardiology (Cardiology) Wonda Horner, MD as Consulting Physician (Gastroenterology) Ralene Ok, MD as Consulting Physician (Surgery) Nicholas Lose, MD as Consulting Physician (Hematology and Oncology) Sylvan Cheese, NP as Nurse Practitioner (Hematology and Oncology) Jovita Kussmaul, MD as Consulting Physician (General Surgery) Thea Silversmith, MD as Consulting Physician (Radiation Oncology)  DIAGNOSIS:    ICD-10-CM   1. Malignant neoplasm of upper-outer quadrant of right breast in female, estrogen receptor negative ([facility])  C50.411    Z17.1     SUMMARY OF ONCOLOGIC HISTORY: Oncology History  Breast cancer of upper-outer quadrant of right female breast (Angela Bond)  07/30/2014 Mammogram   Right breast: Mass at 10 o'clock location 4 cm from the nipple measuring 1.7 x 1.3 x 1.3 cm. This corresponds to the palpable and mammographic finding. In the right axilla, there are several lymph nodes which are normal in size measuring up to 5m.   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%), PR- (0%), HER2/neu negative, Ki67 30%   08/27/2014 Clinical Stage   Stage IIA: T2 N0   09/22/2014 Definitive Surgery   Right lumpectomy/SLNB (Marlou Starks: Invasive ductal carcinoma, grade 3, 2.6 cm, high grade DCIS, LVI present, ER-, PR-, 2 LN removed and 1 positive for metastatic carcinoma (1/2)   09/22/2014 Pathologic Stage   Stage IIB: T2 N1 M0   10/15/2014 - 12/16/2014 Chemotherapy   Adjuvant chemotherapy with Taxotere Cytoxan 4 q3 weeks   10/21/2014 - 10/24/2014 Hospital Admission   Admission for neutropenic fever UTI Klebsiella   01/26/2015 - 03/11/2015 Radiation Therapy   Adjuvant RT (Pablo Ledger: Right breast / 45 Gray @ 1.8 GPearline Cablesper fraction x 25 fractions Right supraclavicular fossa/PAB 45 Gy '@1' .8 Gy per fraction x 25 fractions  Right breast boost / 16 Gray at 2[employer] Corporationper fraction x 8 fractions   05/19/2015 Survivorship   Survivorship care plan visit completed.   03/28/2018 Surgery   Laparoscopic cholecystectomy     CHIEF COMPLIANT: Follow-up of breast cancer and iron deficiency anemia  INTERVAL HISTORY: Angela KATZMANis a 74y.o. with above-mentioned history of right breast cancer treated with lumpectomy,adjuvant chemotherapy,and radiationwhois currentlyon surveillance. She also has a history of iron deficiency anemiapreviously treated with IV iron.Labs on 11/19/19 showed Hg 12.2, HCT 38.0, MCV 92.5.She presents to the clinic todayfor follow-up.  ALLERGIES:  is allergic to shellfish allergy, aspirin, penicillins, aleve [naproxen], brilinta [ticagrelor], ciprofloxacin, doxycycline, evista [raloxifene], fosamax [alendronate sodium], lactose intolerance (gi), latex, naproxen sodium, nitrofurantoin, pravachol [pravastatin], sulfa antibiotics, tape, welchol [colesevelam hcl], and zocor [simvastatin].  MEDICATIONS:  Current Outpatient Medications  Medication Sig Dispense Refill  . acetaminophen (TYLENOL) 500 MG tablet Take 1 tablet (500 mg total) by mouth every 6 (six) hours as needed for moderate pain or headache. 30 tablet 0  . ALPRAZolam (XANAX) 0.25 MG tablet Take 0.25 mg by mouth 2 (two) times daily as needed.    .Marland Kitchenaspirin EC 81 MG EC tablet Take 1 tablet (81 mg total) by mouth daily.    . calcium carbonate (OSCAL) 1500 (600 Ca) MG TABS tablet Take by mouth.    . Calcium Carbonate-Vit D-Min (CALCIUM 1200 PO) Take by mouth.    . Cholecalciferol (VITAMIN D) 2000 UNITS tablet Take 2,000 Units by mouth 2 (two) times daily.     . clopidogrel (PLAVIX) 75 MG tablet Take  75 mg by mouth daily.    Marland Kitchen dexlansoprazole (DEXILANT) 60 MG capsule Take 60 mg by mouth daily.    Marland Kitchen escitalopram (LEXAPRO) 20 MG tablet Take 20 mg by mouth daily.    Marland Kitchen ezetimibe (ZETIA) 10 MG tablet Take 1 tablet (10 mg total) by mouth at bedtime.  90 tablet 3  . famotidine (PEPCID) 20 MG tablet Take 20 mg by mouth at bedtime.     . fexofenadine (ALLEGRA) 180 MG tablet Take 180 mg by mouth daily as needed for allergies.     . furosemide (LASIX) 20 MG tablet Take one tablet daily as needed for 3 lbs over night weight gain 30 tablet 3  . gabapentin (NEURONTIN) 600 MG tablet Take 1 tablet (600 mg total) by mouth 3 (three) times daily. 90 tablet 0  . LORazepam (ATIVAN) 1 MG tablet Take 1 mg by mouth at bedtime.     . lovastatin (MEVACOR) 40 MG tablet TAKE 1 TABLET BY MOUTH AT  BEDTIME 90 tablet 2  . Magnesium 500 MG TABS Take 500 mg by mouth at bedtime.    . meclizine (ANTIVERT) 25 MG tablet Take 25 mg by mouth 3 (three) times daily as needed for dizziness.     . methylPREDNISolone (MEDROL DOSEPAK) 4 MG TBPK tablet 6 day dose pack - take as directed 21 tablet 0  . montelukast (SINGULAIR) 10 MG tablet Take 10 mg by mouth at bedtime.    . NEOMYCIN-POLYMYXIN-HYDROCORTISONE (CORTISPORIN) 1 % SOLN OTIC solution Apply 1-2 drops to toe BID after soaking 10 mL 1  . nitroGLYCERIN (NITROSTAT) 0.4 MG SL tablet Place 1 tablet (0.4 mg total) under the tongue every 5 (five) minutes as needed for chest pain (CP or SOB). 25 tablet 6  . nystatin cream (MYCOSTATIN) Apply 1 application topically 2 (two) times daily. 30 g 4  . Polyvinyl Alcohol-Povidone PF (REFRESH) 1.4-0.6 % SOLN Place 2 drops into both eyes 3 (three) times daily as needed (for dry eyes).    . potassium chloride (K-DUR) 10 MEQ tablet Take 10 mEq by mouth every 3 (three) days.    Marland Kitchen triamcinolone cream (KENALOG) 0.1 % Apply 1 application topically 2 (two) times daily. 30 g 4  . vitamin C (ASCORBIC ACID) 500 MG tablet Take 500 mg by mouth daily.    . Zinc Acetate, Oral, (ZINC ACETATE PO) Take by mouth.     No current facility-administered medications for this visit.    PHYSICAL EXAMINATION: ECOG PERFORMANCE STATUS: 1 - Symptomatic but completely ambulatory  Vitals:   11/21/19 1025  BP:  125/72  Pulse: 69  Resp: 17  Temp: 98.5 F (36.9 C)  SpO2: 97%   Filed Weights   11/21/19 1025  Weight: 144 lb 12.8 oz (65.7 kg)    BREAST: No palpable masses or nodules in either right or left breasts. No palpable axillary supraclavicular or infraclavicular adenopathy no breast tenderness or nipple discharge. (exam performed in the presence of a chaperone)  LABORATORY DATA:  I have reviewed the data as listed CMP Latest Ref Rng & Units 11/19/2019 03/26/2019 12/25/2018  Glucose 70 - 99 mg/dL 123(H) 91 88  BUN 8 - 23 mg/dL 9 11 7(L)  Creatinine 0.44 - 1.00 mg/dL 0.80 0.85 0.78  Sodium 135 - 145 mmol/L 140 139 140  Potassium 3.5 - 5.1 mmol/L 4.0 4.6 4.3  Chloride 98 - 111 mmol/L 103 99 103  CO2 22 - 32 mmol/L '28 26 24  ' Calcium 8.9 - 10.3 mg/dL 8.8(L)  9.0 9.0  Total Protein 6.5 - 8.1 g/dL 7.2 7.1 7.6  Total Bilirubin 0.3 - 1.2 mg/dL 0.4 0.4 1.0  Alkaline Phos 38 - 126 U/L 74 91 92  AST 15 - 41 U/L '15 17 21  ' ALT 0 - 44 U/L '11 8 12    ' Lab Results  Component Value Date   WBC 6.5 11/19/2019   HGB 12.2 11/19/2019   HCT 38.0 11/19/2019   MCV 92.5 11/19/2019   PLT 220 11/19/2019   NEUTROABS 4.2 11/19/2019    ASSESSMENT & PLAN:  Breast cancer of upper-outer quadrant of right female breast ([place]) Diagnosed 2016 treated with lumpectomy, Taxotere/Cytoxan as well as radiation therapy, triple negative  Breast cancer surveillance: 1.Breast exam 05/24/2019: Benign no palpable lumps or nodules 2.Mammogram and ultrasound6/07/2018 no mammographic evidence of malignancy, breast density category C She is going to make another appointment to get her mammogram scheduled.   3.  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  Lab review: 11/19/2019:  Hemoglobin 12.2 Patient feels extremely tired primarily because of her husband who has Alzheimer's and is on hospice care.  She cares for him as well as takes care of the entire house and she is exhausted as result of that.  Recheck labs and follow-up in6 months    No orders of the defined types were placed in this encounter.  The patient has a good understanding of the overall plan. she agrees with it. she will call with any problems that may develop before the next visit here.  Total time spent: 20 mins including face to face time and time spent for planning, charting and coordination of care  Nicholas Lose, MD 11/21/2019  I, Cloyde Reams Dorshimer, am acting as scribe for Dr. Nicholas Lose.  I have reviewed the above documentation for accuracy and completeness, and I agree with the above.

## 2019-11-20 NOTE — Assessment & Plan Note (Signed)
Diagnosed 2016 treated with lumpectomy, Taxotere/Cytoxan as well as radiation therapy, triple negative  Breast cancer surveillance: 1.Breast exam 05/24/2019: Benign no palpable lumps or nodules 2.Mammogram and ultrasound6/07/2018 no mammographic evidence of malignancy, breast density category C 3.  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  Recheck labs and follow-up in6 months

## 2019-11-21 ENCOUNTER — Other Ambulatory Visit: Payer: Self-pay

## 2019-11-21 ENCOUNTER — Inpatient Hospital Stay: Payer: Medicare Other | Admitting: Hematology and Oncology

## 2019-11-21 DIAGNOSIS — Z171 Estrogen receptor negative status [ER-]: Secondary | ICD-10-CM | POA: Diagnosis not present

## 2019-11-21 DIAGNOSIS — C50411 Malignant neoplasm of upper-outer quadrant of right female breast: Secondary | ICD-10-CM | POA: Diagnosis not present

## 2019-11-21 DIAGNOSIS — Z853 Personal history of malignant neoplasm of breast: Secondary | ICD-10-CM | POA: Diagnosis not present

## 2019-12-03 ENCOUNTER — Ambulatory Visit (INDEPENDENT_AMBULATORY_CARE_PROVIDER_SITE_OTHER): Payer: Medicare Other | Admitting: Podiatry

## 2019-12-03 ENCOUNTER — Other Ambulatory Visit: Payer: Self-pay

## 2019-12-03 ENCOUNTER — Encounter: Payer: Self-pay | Admitting: Podiatry

## 2019-12-03 DIAGNOSIS — L03031 Cellulitis of right toe: Secondary | ICD-10-CM | POA: Diagnosis not present

## 2019-12-03 MED ORDER — CLINDAMYCIN HCL 150 MG PO CAPS
150.0000 mg | ORAL_CAPSULE | Freq: Three times a day (TID) | ORAL | 1 refills | Status: DC
Start: 2019-12-03 — End: 2019-12-19

## 2019-12-04 NOTE — Progress Notes (Signed)
She presents today for follow-up of her nail procedure hallux right.  States that is a little sore and red but much better than it was.  Objective: Vital signs are stable she is alert and oriented x3 there is some redness mild paronychia associated with this.  Does not demonstrate any purulence or malodor.  Assessment: Paronychia status post matrixectomy.  Plan: We will put her on clindamycin for the next couple of weeks I will follow-up with her then to make sure she is healing well.  I encouraged her to continue to soak Epson salts and warm water daily and cover with a Band-Aid during the day but leave open at bedtime.

## 2019-12-19 ENCOUNTER — Other Ambulatory Visit: Payer: Self-pay

## 2019-12-19 ENCOUNTER — Ambulatory Visit (INDEPENDENT_AMBULATORY_CARE_PROVIDER_SITE_OTHER): Payer: Medicare Other | Admitting: Podiatry

## 2019-12-19 ENCOUNTER — Encounter: Payer: Self-pay | Admitting: Podiatry

## 2019-12-19 DIAGNOSIS — Z9889 Other specified postprocedural states: Secondary | ICD-10-CM

## 2019-12-19 DIAGNOSIS — M7661 Achilles tendinitis, right leg: Secondary | ICD-10-CM

## 2019-12-19 DIAGNOSIS — L600 Ingrowing nail: Secondary | ICD-10-CM

## 2019-12-19 MED ORDER — CLINDAMYCIN HCL 150 MG PO CAPS
150.0000 mg | ORAL_CAPSULE | Freq: Three times a day (TID) | ORAL | 1 refills | Status: DC
Start: 2019-12-19 — End: 2020-11-19

## 2019-12-20 NOTE — Progress Notes (Signed)
She presents today for follow-up of her paronychia from an AP procedure done on 11/19/2019 she states is doing much much better follow-up of her Achilles tendinitis states that her heel is swollen and it was sore today.  Objective: Vital signs are stable alert oriented x3.  Pulses are palpable.  The toenail is healing very nicely I see no signs of infection.  The heel is slightly swollen and mildly tender on palpation at its insertion site.  There appears to be soft tissue fluctuance underneath the skin.  Most likely bursitis.  Assessment: Retrocalcaneal bursitis right.  And resolving paronychia of the hallux from matrixectomy.  Plan: At this point I went ahead and injected 2 mg of dexamethasone to the point of maximal tenderness of the right heel.  She will continue to soak the right oot until completely resolved.  Follow-up with her in a few weeks.  I did go ahead and write her a prescription for Keflex 500 mg 1 p.o. twice daily just to make sure that the swelling in the forefoot is not associated with the toes.  I also do not want her to develop any cellulitis in the swelling.

## 2020-03-17 ENCOUNTER — Other Ambulatory Visit: Payer: Self-pay

## 2020-03-17 ENCOUNTER — Ambulatory Visit
Admission: RE | Admit: 2020-03-17 | Discharge: 2020-03-17 | Disposition: A | Discharge status: Home / Self Care | Payer: Medicare Other | Source: Ambulatory Visit | Attending: Hematology and Oncology | Admitting: Hematology and Oncology

## 2020-03-17 DIAGNOSIS — Z853 Personal history of malignant neoplasm of breast: Secondary | ICD-10-CM

## 2020-03-26 ENCOUNTER — Other Ambulatory Visit: Payer: Self-pay

## 2020-03-26 ENCOUNTER — Encounter: Payer: Self-pay | Admitting: Cardiology

## 2020-03-26 ENCOUNTER — Ambulatory Visit (INDEPENDENT_AMBULATORY_CARE_PROVIDER_SITE_OTHER): Payer: Medicare Other | Admitting: Cardiology

## 2020-03-26 VITALS — BP 120/70 | HR 77 | Ht <= 58 in | Wt 157.0 lb

## 2020-03-26 DIAGNOSIS — I951 Orthostatic hypotension: Secondary | ICD-10-CM

## 2020-03-26 DIAGNOSIS — I2510 Atherosclerotic heart disease of native coronary artery without angina pectoris: Secondary | ICD-10-CM

## 2020-03-26 DIAGNOSIS — I209 Angina pectoris, unspecified: Secondary | ICD-10-CM | POA: Diagnosis not present

## 2020-03-26 NOTE — Patient Instructions (Addendum)
Medication Instructions:  The current medical regimen is effective;  continue present plan and medications.  *If you need a refill on your cardiac medications before your next appointment, please call your pharmacy*  Follow-Up: At [facility], you and your health needs are our priority.  As part of our continuing mission to provide you with exceptional heart care, we have created designated Provider Care Teams.  These Care Teams include your primary Cardiologist (physician) and Advanced Practice Providers (APPs -  Physician Assistants and Nurse Practitioners) who all work together to provide you with the care you need, when you need it.  We recommend signing up for the patient portal called "MyChart".  Sign up information is provided on this After Visit Summary.  MyChart is used to connect with patients for Virtual Visits (Telemedicine).  Patients are able to view lab/test results, encounter notes, upcoming appointments, etc.  Non-urgent messages can be sent to your provider as well.   To learn more about what you can do with MyChart, go to [email or web address].    Your next appointment:   12 month(s)  The format for your next appointment:   In Person  Provider:   Mark Skains, MD   Thank you for choosing [facility]!!      

## 2020-03-26 NOTE — Progress Notes (Signed)
Cardiology Office Note:    Date:  03/28/2020   ID:  Angela Bond, DOB 07-04-1945, MRN [id number]  PCP:  Gaynelle Arabian, MD  [facility] Cardiologist:  Candee Furbish, MD  [facility] Electrophysiologist:  None   Referring MD: Gaynelle Arabian, MD    History of Present Illness:    Angela Bond is a 74 y.o. female coronary artery disease follow-up.  Prior DES to mid LAD in June 2017 in the setting of non-ST elevation myocardial infarction.  Prior office notes as follows:  CAD s/p NSTEMI in June 2017 with DES to mid LAD, HTN, orthostatic hypotension, HLD,and anxiety who presented to [facility] with chest pain. There was residual disease in her proximal RCA (30%) and 35% in his ostial 2nd diagonal. She was placed on DAPT with ASA and Brilinta which was switched to Plavix outpatient for cost reasons. Of note, she was seen in the ED on 11/10/15 for chest pain 4 days after she was discharged from her NSTEMI. A GI cocktail relieved her pain in the ED and she was discharged from the ED. Her lisinopril was discontinued in July of this year as she had some orthostatic hypotension in the office. She reported compliance with medical therapy.   During one cardiac rehab session, she was hypotensive with BP of 88/60 post exercise. She was given a Gatorade and her BP rose to 94/58. She was dizzy and diaphoretic at the time. She called our office and her beta blocker (metoprolol 12.5 mg BID) was discontinued. She was at cardiac rehab again and developed left sided chest pain with radiation to her neck and shoulder with associated SOB, diaphoresis and nausea, somewhat reminiscent of prior angina. EKG during pain showed no acute ST/T changes. Initial troponin was negative.Vitals were stable with BP 142/98 (mildly hypertensive). CXR NAD. Dr. Burt Knack felt her symptoms were difficult to sort out. Her PCI was just performed about 2 months prior to this and he felt it was unlikely that she has restenosis or new  coronary plaque rupture as she has been compliant with medical therapy. Labs otherwise notable for Hgb 10.7 (c/w prior). Per notes from our team,she had troponin rechecked down in nuc med and it was negative. She underwent inpatient stress test. This showed septal scar/infarction, no findings for ischemia, EF 68%. It was recommended to try her back on a nightly dose of Toprol 12.5mg  daily but she was unable to take.  She did not wish to take atorvastatin, Crestor was too expensive. She is now on lovastatin in doing well with this. She was reassured by the nuclear stress test that was low risk with no ischemia. She is now able to walk without any difficulty. She is less short of breath. Overall she is happy. She changed her depression medicine and she is feeling much better.  09/22/16-she comes in today for follow-up. Notes from Truitt Merle and pharmacy team reviewed. Crestor too weak, legs hurt. No CP, no SOB (mild up hill to house) She is also full of small skin lesions.  06/22/17 - getting over cold, mild wheeze. No fever, chills, no syncope, no bleeding. Skin rash has improved. No CP.   03/26/19 -here for coronary artery disease follow-up.  She has lost about 20 to 30 pounds.  Doing well.  No fevers chills nausea vomiting syncope bleeding.  Taking her medications.  She did have a humeral fracture.  Has trouble with full extension of her right arm.    Past Medical History:  Diagnosis  Date  . Anemia    hx  . Anxiety and depression   . Aortic atherosclerosis ([place]) 07/31/2017  . Arthritis    "~ all over my body; in the joints"  . Breast cancer, right ([place]) 3/16  . Coronary artery disease    a.  s/p NSTEMI in June 2017 with DES to mid LAD.  Marland Kitchen Depression   . Gallstones   . GERD (gastroesophageal reflux disease)   . H/O hiatal hernia   . Headache   . Heart murmur   . History of blood transfusion    "when taking chemo & w/one of my knee replacements" (11/05/2015)  . Hypercholesteremia    . Hypertension   . Iron deficiency anemia 02/16/2017  . Multiple bruises   . Myocardial infarction ([place])   . Orthostatic hypotension   . Personal history of chemotherapy   . Personal history of radiation therapy   . Pneumonia X 1  . Shingles 5/15   hx  . Sliding hiatal hernia s/p lap PEH repair 07/31/2012  . Unstable angina ([place]) 01/23/2012  . Unsteady gait    " I fall easily"  . Wears dentures    partial  . Wears glasses     Past Surgical History:  Procedure Laterality Date  . BREAST BIOPSY Right   . BREAST LUMPECTOMY Right 08/2014  . BREAST LUMPECTOMY WITH SENTINEL LYMPH NODE BIOPSY Right 09/22/2014   Procedure: RIGHT BREAST LUMPECTOMY WITH NEEDLE LOCALIZATION AND RIGHT AXILLARY SENTINEL LYMPH NODE BIOPSY;  Surgeon: [name], MD;  Location: [place];  Service: General;  Laterality: Right;  . CARDIAC CATHETERIZATION    . CARDIAC CATHETERIZATION N/A 11/05/2015   Procedure: Left Heart Cath and Coronary Angiography;  Surgeon: Belva Crome, MD;  Location: [place] [facility];  Service: Cardiovascular;  Laterality: N/A;  . CARDIAC CATHETERIZATION N/A 11/05/2015   Procedure: Coronary Stent Intervention;  Surgeon: Belva Crome, MD;  Location: [place] [facility];  Service: Cardiovascular;  Laterality: N/A;  . CARPAL TUNNEL RELEASE Right 09/2015  . CHOLECYSTECTOMY N/A 03/28/2018   Procedure: LAPAROSCOPIC CHOLECYSTECTOMY WITH INTRAOPERATIVE CHOLANGIOGRAM ERAS PATHWAY;  Surgeon: Jovita Kussmaul, MD;  Location: [place];  Service: General;  Laterality: N/A;  . CORONARY ANGIOPLASTY WITH STENT PLACEMENT  11/05/2015   "1 stent"  . ESOPHAGEAL MANOMETRY N/A 07/02/2012   Procedure: ESOPHAGEAL MANOMETRY (EM);  Surgeon: Lear Ng, MD;  Location: [facility];  Service: Endoscopy;  Laterality: N/A;  schooler to read  . EYE SURGERY Left 2013   tube placed tear duct  . HIATAL HERNIA REPAIR N/A 08/10/2012   Procedure: LAPAROSCOPIC REPAIR OF HIATAL HERNIA WITH MESH AND EGD WITH PEG TUBE PLACEMENT;   Surgeon: Ralene Ok, MD;  Location: [facility];  Service: General;  Laterality: N/A;  . LEFT HEART CATH AND CORONARY ANGIOGRAPHY N/A 08/01/2017   Procedure: LEFT HEART CATH AND CORONARY ANGIOGRAPHY;  Surgeon: Jettie Booze, MD;  Location: [place] [facility];  Service: Cardiovascular;  Laterality: N/A;  . ORIF ELBOW FRACTURE Right 12/25/2018  . ORIF HUMERUS FRACTURE Right 12/25/2018   Procedure: OPEN REDUCTION INTERNAL FIXATION (ORIF) DISTAL HUMERUS FRACTURE;  Surgeon: Altamese [place], MD;  Location: [place];  Service: Orthopedics;  Laterality: Right;  . PORT-A-CATH REMOVAL  06/2015  . PORTACATH PLACEMENT Left 09/22/2014   Procedure: INSERTION PORT-A-CATH;  Surgeon: [name], MD;  Location: [place];  Service: General;  Laterality: Left;  . POSTERIOR LUMBAR FUSION  2016   "had 2 vertebrae fused"  .  TONSILLECTOMY  as child  . TOTAL KNEE ARTHROPLASTY Bilateral 2011,2012    Current Medications: Current Meds  Medication Sig  . acetaminophen (TYLENOL) 500 MG tablet Take 1 tablet (500 mg total) by mouth every 6 (six) hours as needed for moderate pain or headache.  . ALPRAZolam (XANAX) 0.25 MG tablet Take 0.25 mg by mouth 2 (two) times daily as needed.  Marland Kitchen aspirin EC 81 MG EC tablet Take 1 tablet (81 mg total) by mouth daily.  . calcium carbonate (OSCAL) 1500 (600 Ca) MG TABS tablet Take by mouth.  . Calcium Carbonate-Vit D-Min (CALCIUM 1200 PO) Take by mouth.  . Cholecalciferol (VITAMIN D) 2000 UNITS tablet Take 2,000 Units by mouth 2 (two) times daily.   . clindamycin (CLEOCIN) 150 MG capsule Take 1 capsule (150 mg total) by mouth 3 (three) times daily.  Marland Kitchen dexlansoprazole (DEXILANT) 60 MG capsule Take 60 mg by mouth daily.  Marland Kitchen escitalopram (LEXAPRO) 20 MG tablet Take 20 mg by mouth daily.  Marland Kitchen ezetimibe (ZETIA) 10 MG tablet Take 1 tablet (10 mg total) by mouth at bedtime.  . famotidine (PEPCID) 20 MG tablet Take 20 mg by mouth at bedtime.   . fexofenadine (ALLEGRA) 180 MG tablet Take 180 mg by  mouth daily as needed for allergies.   . furosemide (LASIX) 20 MG tablet Take one tablet daily as needed for 3 lbs over night weight gain  . gabapentin (NEURONTIN) 600 MG tablet Take 1 tablet (600 mg total) by mouth 3 (three) times daily.  Marland Kitchen lovastatin (MEVACOR) 40 MG tablet TAKE 1 TABLET BY MOUTH AT  BEDTIME  . Magnesium 500 MG TABS Take 500 mg by mouth at bedtime.  . meclizine (ANTIVERT) 25 MG tablet Take 25 mg by mouth 3 (three) times daily as needed for dizziness.   . montelukast (SINGULAIR) 10 MG tablet Take 10 mg by mouth at bedtime.  . NEOMYCIN-POLYMYXIN-HYDROCORTISONE (CORTISPORIN) 1 % SOLN OTIC solution Apply 1-2 drops to toe BID after soaking  . nitroGLYCERIN (NITROSTAT) 0.4 MG SL tablet Place 1 tablet (0.4 mg total) under the tongue every 5 (five) minutes as needed for chest pain (CP or SOB).  Marland Kitchen nystatin cream (MYCOSTATIN) Apply 1 application topically 2 (two) times daily.  . Polyvinyl Alcohol-Povidone PF (REFRESH) 1.4-0.6 % SOLN Place 2 drops into both eyes 3 (three) times daily as needed (for dry eyes).  . potassium chloride (K-DUR) 10 MEQ tablet Take 10 mEq by mouth every 3 (three) days.  Marland Kitchen triamcinolone cream (KENALOG) 0.1 % Apply 1 application topically 2 (two) times daily.  . vitamin C (ASCORBIC ACID) 500 MG tablet Take 500 mg by mouth daily.  . Zinc Acetate, Oral, (ZINC ACETATE PO) Take by mouth.     Allergies:   Shellfish allergy, Aspirin, Penicillins, Aleve [naproxen], Brilinta [ticagrelor], Ciprofloxacin, Doxycycline, Evista [raloxifene], Fosamax [alendronate sodium], Lactose intolerance (gi), Latex, Naproxen sodium, Nitrofurantoin, Pravachol [pravastatin], Sulfa antibiotics, Tape, Welchol [colesevelam hcl], and Zocor [simvastatin]   Social History   Socioeconomic History  . Marital status: Married    Spouse name: Not on file  . Number of children: 1  . Years of education: Not on file  . Highest education level: Not on file  Occupational History  . Not on file    Tobacco Use  . Smoking status: Never Smoker  . Smokeless tobacco: Never Used  Vaping Use  . Vaping Use: Never used  Substance and Sexual Activity  . Alcohol use: No  . Drug use: No  . Sexual activity:  Not Currently    Comment: 1st intercourse 18 yo-2 partners  Other Topics Concern  . Not on file  Social History Narrative   Lives with husband.  Does not use assist device.     Social Determinants of Health   Financial Resource Strain:   . Difficulty of Paying Living Expenses: Not on file  Food Insecurity:   . Worried About Charity fundraiser in the Last Year: Not on file  . Ran Out of Food in the Last Year: Not on file  Transportation Needs:   . Lack of Transportation (Medical): Not on file  . Lack of Transportation (Non-Medical): Not on file  Physical Activity:   . Days of Exercise per Week: Not on file  . Minutes of Exercise per Session: Not on file  Stress:   . Feeling of Stress : Not on file  Social Connections:   . Frequency of Communication with Friends and Family: Not on file  . Frequency of Social Gatherings with Friends and Family: Not on file  . Attends Religious Services: Not on file  . Active Member of Clubs or Organizations: Not on file  . Attends Archivist Meetings: Not on file  . Marital Status: Not on file     Family History: The patient's family history includes Cancer in her father; Cancer (age of onset: 11) in her sister; Diabetes in her father and sister; Heart disease in her father, mother, and sister.  ROS:   Please see the history of present illness.     All other systems reviewed and are negative.  EKGs/Labs/Other Studies Reviewed:    The following studies were reviewed today:  Cath 11/05/15: 1. Prox RCA to Mid RCA lesion, 30% stenosed. 2. Mid LAD lesion, 99% stenosed. Post intervention, there is a 5% residual stenosis. 3. Ost 2nd Diag lesion, 35% stenosed.   Acute coronary syndrome caused by acute/subacute thrombotic  obstruction of the proximal LAD forming a [place] 111 bifurcation lesion with the large diagonal #1.  Luminal irregularities are noted in the circumflex and right coronary.  Severe anteroapical hypokinesis with estimated ejection fraction 35-40%. LV end-diastolic pressure is normal.  Successful PTCA and stent of the LAD thrombus containing lesion with reduction in the 95% stenosis to 0% with TIMI grade 3 flow using a drug-eluting stent postdilated to 3.5 mm in diameter. The diagonal branch though "jailed" ended with less than 40% ostial obstruction.   NUC stress 01/16/16: 1. Septal scar/infarction. No findings for ischemia.  2. Septal wall motion abnormality and bulging of the inferoseptal wall with contraction.  3. Left ventricular ejection fraction 68%  4. Non invasive risk stratification*: Intermediate to low risk.    EKG:  EKG is  ordered today.  The ekg ordered today demonstrates sinus rhythm 77 with no other abnormalities.  Recent Labs: 11/19/2019: ALT 11; BUN 9; Creatinine 0.80; Hemoglobin 12.2; Platelet Count 220; Potassium 4.0; Sodium 140  Recent Lipid Panel    Component Value Date/Time   CHOL 122 03/26/2019 1106   TRIG 66 03/26/2019 1106   HDL 66 03/26/2019 1106   CHOLHDL 1.8 03/26/2019 1106   CHOLHDL 2.6 08/01/2017 0355   VLDL 13 08/01/2017 0355   LDLCALC 42 03/26/2019 1106     Physical Exam:    VS:  BP 120/70   Pulse 77   Ht 4\' 8"  (1.422 m)   Wt 157 lb (71.2 kg)   SpO2 96%   BMI 35.20 kg/m     Wt Readings from  Last 3 Encounters:  03/26/20 157 lb (71.2 kg)  11/21/19 144 lb 12.8 oz (65.7 kg)  05/24/19 141 lb 1.6 oz (64 kg)     GEN:  Well nourished, well developed in no acute distress HEENT: Normal NECK: No JVD; No carotid bruits LYMPHATICS: No lymphadenopathy CARDIAC: RRR, no murmurs, rubs, gallops RESPIRATORY:  Clear to auscultation without rales, wheezing or rhonchi  ABDOMEN: Soft, non-tender, non-distended MUSCULOSKELETAL:  No edema; No  deformity  SKIN: Warm and dry NEUROLOGIC:  Alert and oriented x 3 PSYCHIATRIC:  Normal affect   ASSESSMENT:    1. CAD in native artery   2. Angina pectoris ([place])   3. Orthostatic hypotension    PLAN:    In order of problems listed above:  Coronary disease -Mid LAD drug-eluting stent 2017 -Residual disease in RCA and ostial second diagonal. -Continue with aspirin.  Angina -Well-controlled with current regimen.  Last stress test was reassuring, see above reviewed.  Orthostatic hypotension -Has been stable recently.  Previously lisinopril was discontinued.  Beta-blocker was stopped because of orthostasis as well.  Again to be very careful with her blood pressure medications.  She does occasionally feel some dizziness when standing up.  She knows to be careful.  Bedbugs -Previously noted on her pants.  Her husband also had bedbugs at a prior visit.  Unfortunately her husband has passed away.  No bedbugs noted at today's visit.  Brief postop A. fib in 2014 during PEG tube placement. -No recurrence.  Cancer of the breast -Note from Dr. Lindi Adie reviewed from 04/17/2019 -2016 lumpectomy radiation chemo.  Hyperlipidemia -Has had intolerances in the past to different statin such as simvastatin.  Tolerating Mevacor and Zetia together.  Her last LDL was 70.  Excellent.   Shared Decision Making/Informed Consent      Medication Adjustments/Labs and Tests Ordered: Current medicines are reviewed at length with the patient today.  Concerns regarding medicines are outlined above.  Orders Placed This Encounter  Procedures  . EKG 12-Lead   No orders of the defined types were placed in this encounter.   Patient Instructions  Medication Instructions:  The current medical regimen is effective;  continue present plan and medications.  *If you need a refill on your cardiac medications before your next appointment, please call your pharmacy*  Follow-Up: At [facility], you and your  health needs are our priority.  As part of our continuing mission to provide you with exceptional heart care, we have created designated Provider Care Teams.  These Care Teams include your primary Cardiologist (physician) and Advanced Practice Providers (APPs -  Physician Assistants and Nurse Practitioners) who all work together to provide you with the care you need, when you need it.  We recommend signing up for the patient portal called "MyChart".  Sign up information is provided on this After Visit Summary.  MyChart is used to connect with patients for Virtual Visits (Telemedicine).  Patients are able to view lab/test results, encounter notes, upcoming appointments, etc.  Non-urgent messages can be sent to your provider as well.   To learn more about what you can do with MyChart, go to [email or web address].    Your next appointment:   12 month(s)  The format for your next appointment:   In Person  Provider:   Candee Furbish, MD   Thank you for choosing [facility]!!         Signed, Candee Furbish, MD  03/28/2020 7:05 AM    [place]

## 2020-05-20 ENCOUNTER — Other Ambulatory Visit: Payer: Self-pay

## 2020-05-20 ENCOUNTER — Inpatient Hospital Stay: Payer: Medicare Other | Attending: Hematology and Oncology

## 2020-05-20 DIAGNOSIS — R42 Dizziness and giddiness: Secondary | ICD-10-CM | POA: Diagnosis not present

## 2020-05-20 DIAGNOSIS — Z171 Estrogen receptor negative status [ER-]: Secondary | ICD-10-CM | POA: Diagnosis not present

## 2020-05-20 DIAGNOSIS — M8580 Other specified disorders of bone density and structure, unspecified site: Secondary | ICD-10-CM | POA: Insufficient documentation

## 2020-05-20 DIAGNOSIS — Z79899 Other long term (current) drug therapy: Secondary | ICD-10-CM | POA: Insufficient documentation

## 2020-05-20 DIAGNOSIS — Z08 Encounter for follow-up examination after completed treatment for malignant neoplasm: Secondary | ICD-10-CM | POA: Diagnosis not present

## 2020-05-20 DIAGNOSIS — Z7982 Long term (current) use of aspirin: Secondary | ICD-10-CM | POA: Diagnosis not present

## 2020-05-20 DIAGNOSIS — D509 Iron deficiency anemia, unspecified: Secondary | ICD-10-CM | POA: Insufficient documentation

## 2020-05-20 DIAGNOSIS — G629 Polyneuropathy, unspecified: Secondary | ICD-10-CM | POA: Insufficient documentation

## 2020-05-20 DIAGNOSIS — Z853 Personal history of malignant neoplasm of breast: Secondary | ICD-10-CM | POA: Insufficient documentation

## 2020-05-20 DIAGNOSIS — Z923 Personal history of irradiation: Secondary | ICD-10-CM | POA: Diagnosis not present

## 2020-05-20 DIAGNOSIS — C50411 Malignant neoplasm of upper-outer quadrant of right female breast: Secondary | ICD-10-CM

## 2020-05-20 DIAGNOSIS — Z9221 Personal history of antineoplastic chemotherapy: Secondary | ICD-10-CM | POA: Diagnosis not present

## 2020-05-20 LAB — FERRITIN: Ferritin: 54 ng/mL (ref 11–307)

## 2020-05-20 LAB — CBC WITH DIFFERENTIAL (CANCER CENTER ONLY)
Abs Immature Granulocytes: 0.01 10*3/uL (ref 0.00–0.07)
Basophils Absolute: 0 10*3/uL (ref 0.0–0.1)
Basophils Relative: 0 %
Eosinophils Absolute: 0.5 10*3/uL (ref 0.0–0.5)
Eosinophils Relative: 6 %
HCT: 38.4 % (ref 36.0–46.0)
Hemoglobin: 12.7 g/dL (ref 12.0–15.0)
Immature Granulocytes: 0 %
Lymphocytes Relative: 20 %
Lymphs Abs: 1.6 10*3/uL (ref 0.7–4.0)
MCH: 29.3 pg (ref 26.0–34.0)
MCHC: 33.1 g/dL (ref 30.0–36.0)
MCV: 88.5 fL (ref 80.0–100.0)
Monocytes Absolute: 0.6 10*3/uL (ref 0.1–1.0)
Monocytes Relative: 8 %
Neutro Abs: 5 10*3/uL (ref 1.7–7.7)
Neutrophils Relative %: 66 %
Platelet Count: 203 10*3/uL (ref 150–400)
RBC: 4.34 MIL/uL (ref 3.87–5.11)
RDW: 13.5 % (ref 11.5–15.5)
WBC Count: 7.7 10*3/uL (ref 4.0–10.5)
nRBC: 0 % (ref 0.0–0.2)

## 2020-05-20 LAB — CMP (CANCER CENTER ONLY)
ALT: 9 U/L (ref 0–44)
AST: 12 U/L — ABNORMAL LOW (ref 15–41)
Albumin: 3.2 g/dL — ABNORMAL LOW (ref 3.5–5.0)
Alkaline Phosphatase: 72 U/L (ref 38–126)
Anion gap: 11 (ref 5–15)
BUN: 9 mg/dL (ref 8–23)
CO2: 26 mmol/L (ref 22–32)
Calcium: 8.7 mg/dL — ABNORMAL LOW (ref 8.9–10.3)
Chloride: 104 mmol/L (ref 98–111)
Creatinine: 0.83 mg/dL (ref 0.44–1.00)
GFR, Estimated: >60 mL/min (ref >60)
Glucose, Bld: 143 mg/dL — ABNORMAL HIGH (ref 70–99)
Potassium: 3.7 mmol/L (ref 3.5–5.1)
Sodium: 141 mmol/L (ref 135–145)
Total Bilirubin: 0.5 mg/dL (ref 0.3–1.2)
Total Protein: 7.6 g/dL (ref 6.5–8.1)

## 2020-05-20 LAB — IRON AND TIBC
Iron: 70 ug/dL (ref 41–142)
Saturation Ratios: 22 % (ref 21–57)
TIBC: 314 ug/dL (ref 236–444)
UIBC: 244 ug/dL (ref 120–384)

## 2020-05-21 NOTE — Progress Notes (Signed)
Patient Care Team: Gaynelle Arabian, MD as PCP - General (Family Medicine) Jerline Pain, MD as PCP - Cardiology (Cardiology) Wonda Horner, MD as Consulting Physician (Gastroenterology) Ralene Ok, MD as Consulting Physician (Surgery) Nicholas Lose, MD as Consulting Physician (Hematology and Oncology) Sylvan Cheese, NP as Nurse Practitioner (Hematology and Oncology) Jovita Kussmaul, MD as Consulting Physician (General Surgery) Thea Silversmith, MD as Consulting Physician (Radiation Oncology)  DIAGNOSIS:    ICD-10-CM   1. Malignant neoplasm of upper-outer quadrant of right breast in female, estrogen receptor negative ([place])  C50.411    Z17.1     SUMMARY OF ONCOLOGIC HISTORY: Oncology History  Breast cancer of upper-outer quadrant of right female breast ([place])  07/30/2014 Mammogram   Right breast: Mass at 10 o'clock location 4 cm from the nipple measuring 1.7 x 1.3 x 1.3 cm. This corresponds to the palpable and mammographic finding. In the right axilla, there are several lymph nodes which are normal in size measuring up to 38m.   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%), PR- (0%), HER2/neu negative, Ki67 30%   08/27/2014 Clinical Stage   Stage IIA: T2 N0   09/22/2014 Definitive Surgery   Right lumpectomy/SLNB (Marlou Starks: Invasive ductal carcinoma, grade 3, 2.6 cm, high grade DCIS, LVI present, ER-, PR-, 2 LN removed and 1 positive for metastatic carcinoma (1/2)   09/22/2014 Pathologic Stage   Stage IIB: T2 N1 M0   10/15/2014 - 12/16/2014 Chemotherapy   Adjuvant chemotherapy with Taxotere Cytoxan 4 q3 weeks   10/21/2014 - 10/24/2014 Hospital Admission   Admission for neutropenic fever UTI Klebsiella   01/26/2015 - 03/11/2015 Radiation Therapy   Adjuvant RT (Pablo Ledger: Right breast / 45 Gray @ 1.8 GPearline Cablesper fraction x 25 fractions Right supraclavicular fossa/PAB 45 Gy _0 .8 Gy per fraction x 25 fractions  Right breast boost / 16 Gray at 2[employer] Corporationper fraction x 8 fractions   05/19/2015 Survivorship   Survivorship care plan visit completed.   03/28/2018 Surgery   Laparoscopic cholecystectomy     CHIEF COMPLIANT: Follow-up of breast cancer and iron deficiency anemia  INTERVAL HISTORY: Angela ADELis a 75y.o. with above-mentioned history of right breast cancer treated with lumpectomy,adjuvant chemotherapy,and radiationwhois currentlyon surveillance. Shealso has a history ofiron deficiency anemiapreviously treated with IV iron.Labs on 05/21/19 showed Hg12.7, HCT38.4, MCV88.5, iron saturation 22%, ferritin 54.She presents to the clinic todayfor follow-up. Her husband passed away in A2021/09/26  She is grieving from his loss.  She feels dizzy and has fallen a few times probably because of neuropathy.  ALLERGIES:  is allergic to shellfish allergy, aspirin, penicillins, aleve [naproxen], brilinta [ticagrelor], ciprofloxacin, doxycycline, evista [raloxifene], fosamax [alendronate sodium], lactose intolerance (gi), latex, naproxen sodium, nitrofurantoin, pravachol [pravastatin], sulfa antibiotics, tape, welchol [colesevelam hcl], and zocor [simvastatin].  MEDICATIONS:  Current Outpatient Medications  Medication Sig Dispense Refill   acetaminophen (TYLENOL) 500 MG tablet Take 1 tablet (500 mg total) by mouth every 6 (six) hours as needed for moderate pain or headache. 30 tablet 0   ALPRAZolam (XANAX) 0.25 MG tablet Take 0.25 mg by mouth 2 (two) times daily as needed.     aspirin EC 81 MG EC tablet Take 1 tablet (81 mg total) by mouth daily.     calcium carbonate (OSCAL) 1500 (600 Ca) MG TABS tablet Take by mouth.     Calcium Carbonate-Vit D-Min (CALCIUM 1200 PO) Take by mouth.  Cholecalciferol (VITAMIN D) 2000 UNITS tablet Take 2,000 Units by mouth 2 (two) times daily.      clindamycin (CLEOCIN) 150 MG capsule Take 1 capsule (150 mg total) by mouth 3 (three) times daily. 30  capsule 1   dexlansoprazole (DEXILANT) 60 MG capsule Take 60 mg by mouth daily.     escitalopram (LEXAPRO) 20 MG tablet Take 20 mg by mouth daily.     ezetimibe (ZETIA) 10 MG tablet Take 1 tablet (10 mg total) by mouth at bedtime. 90 tablet 3   famotidine (PEPCID) 20 MG tablet Take 20 mg by mouth at bedtime.      fexofenadine (ALLEGRA) 180 MG tablet Take 180 mg by mouth daily as needed for allergies.      furosemide (LASIX) 20 MG tablet Take one tablet daily as needed for 3 lbs over night weight gain 30 tablet 3   gabapentin (NEURONTIN) 600 MG tablet Take 1 tablet (600 mg total) by mouth 3 (three) times daily. 90 tablet 0   lovastatin (MEVACOR) 40 MG tablet TAKE 1 TABLET BY MOUTH AT  BEDTIME 90 tablet 2   Magnesium 500 MG TABS Take 500 mg by mouth at bedtime.     meclizine (ANTIVERT) 25 MG tablet Take 25 mg by mouth 3 (three) times daily as needed for dizziness.      montelukast (SINGULAIR) 10 MG tablet Take 10 mg by mouth at bedtime.     NEOMYCIN-POLYMYXIN-HYDROCORTISONE (CORTISPORIN) 1 % SOLN OTIC solution Apply 1-2 drops to toe BID after soaking 10 mL 1   nitroGLYCERIN (NITROSTAT) 0.4 MG SL tablet Place 1 tablet (0.4 mg total) under the tongue every 5 (five) minutes as needed for chest pain (CP or SOB). 25 tablet 6   nystatin cream (MYCOSTATIN) Apply 1 application topically 2 (two) times daily. 30 g 4   Polyvinyl Alcohol-Povidone PF (REFRESH) 1.4-0.6 % SOLN Place 2 drops into both eyes 3 (three) times daily as needed (for dry eyes).     potassium chloride (K-DUR) 10 MEQ tablet Take 10 mEq by mouth every 3 (three) days.     triamcinolone cream (KENALOG) 0.1 % Apply 1 application topically 2 (two) times daily. 30 g 4   vitamin C (ASCORBIC ACID) 500 MG tablet Take 500 mg by mouth daily.     Zinc Acetate, Oral, (ZINC ACETATE PO) Take by mouth.     No current facility-administered medications for this visit.    PHYSICAL EXAMINATION: ECOG PERFORMANCE STATUS: 1 - Symptomatic  but completely ambulatory  Vitals:   05/22/20 1127  BP: 136/60  Pulse: 68  Resp: 16  Temp: 97.8 F (36.6 C)  SpO2: 97%   Filed Weights   05/22/20 1127  Weight: 159 lb 11.2 oz (72.4 kg)    LABORATORY DATA:  I have reviewed the data as listed CMP Latest Ref Rng & Units 05/20/2020 11/19/2019 03/26/2019  Glucose 70 - 99 mg/dL 143(H) 123(H) 91  BUN 8 - 23 mg/dL _0 Creatinine 0.44 - 1.00 mg/dL 0.83 0.80 0.85  Sodium 135 - 145 mmol/L 141 140 139  Potassium 3.5 - 5.1 mmol/L 3.7 4.0 4.6  Chloride 98 - 111 mmol/L 104 103 99  CO2 22 - 32 mmol/L _1 Calcium 8.9 - 10.3 mg/dL 8.7(L) 8.8(L) 9.0  Total Protein 6.5 - 8.1 g/dL 7.6 7.2 7.1  Total Bilirubin 0.3 - 1.2 mg/dL 0.5 0.4 0.4  Alkaline Phos 38 - 126 U/L 72 74 91  AST 15 - 41 U/L  12(L) 15 17  ALT 0 - 44 U/L _0 Lab Results  Component Value Date   WBC 7.7 05/20/2020   HGB 12.7 05/20/2020   HCT 38.4 05/20/2020   MCV 88.5 05/20/2020   PLT 203 05/20/2020   NEUTROABS 5.0 05/20/2020    ASSESSMENT & PLAN:  Breast cancer of upper-outer quadrant of right female breast ([place]) Diagnosed 2016 treated with lumpectomy, Taxotere/Cytoxan as well as radiation therapy, triple negative  Breast cancer surveillance: 1.Breast examon 12/02/2020: Benign no palpable lumps or nodules 2.Mammogram 03/17/2020 n benign breast density category C  3.Bone density 03/19/2019: T score -2: Osteopenia continue with calcium vitamin D and weightbearing exercises  Iron deficiency anemia 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  She has fallen a few times because of neuropathy. Her husband passed away in January 29, 2023 and she is still grieving from that.  Recheck labs and follow-up  in6 months    No orders of the defined types were placed in this encounter.  The patient has a good understanding of the overall plan. she agrees with it. she will call with any problems that may develop before the next visit here.  Total time spent: 20 mins including face to face time and time spent for planning, charting and coordination of care  Nicholas Lose, MD 05/22/2020  I, Cloyde Reams Dorshimer, am acting as scribe for Dr. Nicholas Lose.  I have reviewed the above documentation for accuracy and completeness, and I agree with the above.

## 2020-05-22 ENCOUNTER — Inpatient Hospital Stay (HOSPITAL_BASED_OUTPATIENT_CLINIC_OR_DEPARTMENT_OTHER): Payer: Medicare Other | Admitting: Hematology and Oncology

## 2020-05-22 ENCOUNTER — Other Ambulatory Visit: Payer: Self-pay

## 2020-05-22 DIAGNOSIS — D509 Iron deficiency anemia, unspecified: Secondary | ICD-10-CM | POA: Diagnosis not present

## 2020-05-22 DIAGNOSIS — Z853 Personal history of malignant neoplasm of breast: Secondary | ICD-10-CM | POA: Diagnosis not present

## 2020-05-22 DIAGNOSIS — Z7982 Long term (current) use of aspirin: Secondary | ICD-10-CM | POA: Diagnosis not present

## 2020-05-22 DIAGNOSIS — M8580 Other specified disorders of bone density and structure, unspecified site: Secondary | ICD-10-CM | POA: Diagnosis not present

## 2020-05-22 DIAGNOSIS — Z923 Personal history of irradiation: Secondary | ICD-10-CM | POA: Diagnosis not present

## 2020-05-22 DIAGNOSIS — C50411 Malignant neoplasm of upper-outer quadrant of right female breast: Secondary | ICD-10-CM | POA: Diagnosis not present

## 2020-05-22 DIAGNOSIS — Z9221 Personal history of antineoplastic chemotherapy: Secondary | ICD-10-CM | POA: Diagnosis not present

## 2020-05-22 DIAGNOSIS — Z08 Encounter for follow-up examination after completed treatment for malignant neoplasm: Secondary | ICD-10-CM | POA: Diagnosis not present

## 2020-05-22 DIAGNOSIS — Z171 Estrogen receptor negative status [ER-]: Secondary | ICD-10-CM | POA: Diagnosis not present

## 2020-05-22 DIAGNOSIS — R42 Dizziness and giddiness: Secondary | ICD-10-CM | POA: Diagnosis not present

## 2020-05-22 DIAGNOSIS — G629 Polyneuropathy, unspecified: Secondary | ICD-10-CM | POA: Diagnosis not present

## 2020-05-22 DIAGNOSIS — Z79899 Other long term (current) drug therapy: Secondary | ICD-10-CM | POA: Diagnosis not present

## 2020-05-22 NOTE — Assessment & Plan Note (Signed)
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  Lab review: 05/20/2020: Iron saturation 22%, ferritin 54, hemoglobin 12.7 MCV 88.5, CMP: Creatinine 0.83, albumin 3.2, LFTs normal  Patient feels extremely tired primarily because of her husband who has Alzheimer's and is on hospice care.  She cares for him as well as takes care of the entire house and she is exhausted as result of that.  Recheck labs and follow-up in6 months

## 2020-07-16 DIAGNOSIS — S8001XA Contusion of right knee, initial encounter: Secondary | ICD-10-CM | POA: Diagnosis not present

## 2020-07-16 DIAGNOSIS — W19XXXA Unspecified fall, initial encounter: Secondary | ICD-10-CM | POA: Diagnosis not present

## 2020-07-16 DIAGNOSIS — R2681 Unsteadiness on feet: Secondary | ICD-10-CM | POA: Diagnosis not present

## 2020-07-16 DIAGNOSIS — I10 Essential (primary) hypertension: Secondary | ICD-10-CM | POA: Diagnosis not present

## 2020-07-16 DIAGNOSIS — S0083XA Contusion of other part of head, initial encounter: Secondary | ICD-10-CM | POA: Diagnosis not present

## 2020-07-23 DIAGNOSIS — Z853 Personal history of malignant neoplasm of breast: Secondary | ICD-10-CM | POA: Diagnosis not present

## 2020-07-23 DIAGNOSIS — I208 Other forms of angina pectoris: Secondary | ICD-10-CM | POA: Diagnosis not present

## 2020-07-23 DIAGNOSIS — G629 Polyneuropathy, unspecified: Secondary | ICD-10-CM | POA: Diagnosis not present

## 2020-07-23 DIAGNOSIS — R42 Dizziness and giddiness: Secondary | ICD-10-CM | POA: Diagnosis not present

## 2020-07-23 DIAGNOSIS — I25119 Atherosclerotic heart disease of native coronary artery with unspecified angina pectoris: Secondary | ICD-10-CM | POA: Diagnosis not present

## 2020-07-23 DIAGNOSIS — I10 Essential (primary) hypertension: Secondary | ICD-10-CM | POA: Diagnosis not present

## 2020-07-23 DIAGNOSIS — E7800 Pure hypercholesterolemia, unspecified: Secondary | ICD-10-CM | POA: Diagnosis not present

## 2020-07-23 DIAGNOSIS — R7309 Other abnormal glucose: Secondary | ICD-10-CM | POA: Diagnosis not present

## 2020-07-23 DIAGNOSIS — E559 Vitamin D deficiency, unspecified: Secondary | ICD-10-CM | POA: Diagnosis not present

## 2020-07-23 DIAGNOSIS — K219 Gastro-esophageal reflux disease without esophagitis: Secondary | ICD-10-CM | POA: Diagnosis not present

## 2020-08-24 DIAGNOSIS — R350 Frequency of micturition: Secondary | ICD-10-CM | POA: Diagnosis not present

## 2020-09-01 IMAGING — DX RIGHT SHOULDER - 2+ VIEW
3 series · 3 of 3 positions shown · non-contrast
Comparison: Right elbow CT 11/24/2018 and earlier.

CLINICAL DATA: 73-year-old female status post fall with pain.
Distal right humerus fracture earlier this month.

EXAM:
RIGHT SHOULDER - 2+ VIEW

[shoulder grashey]
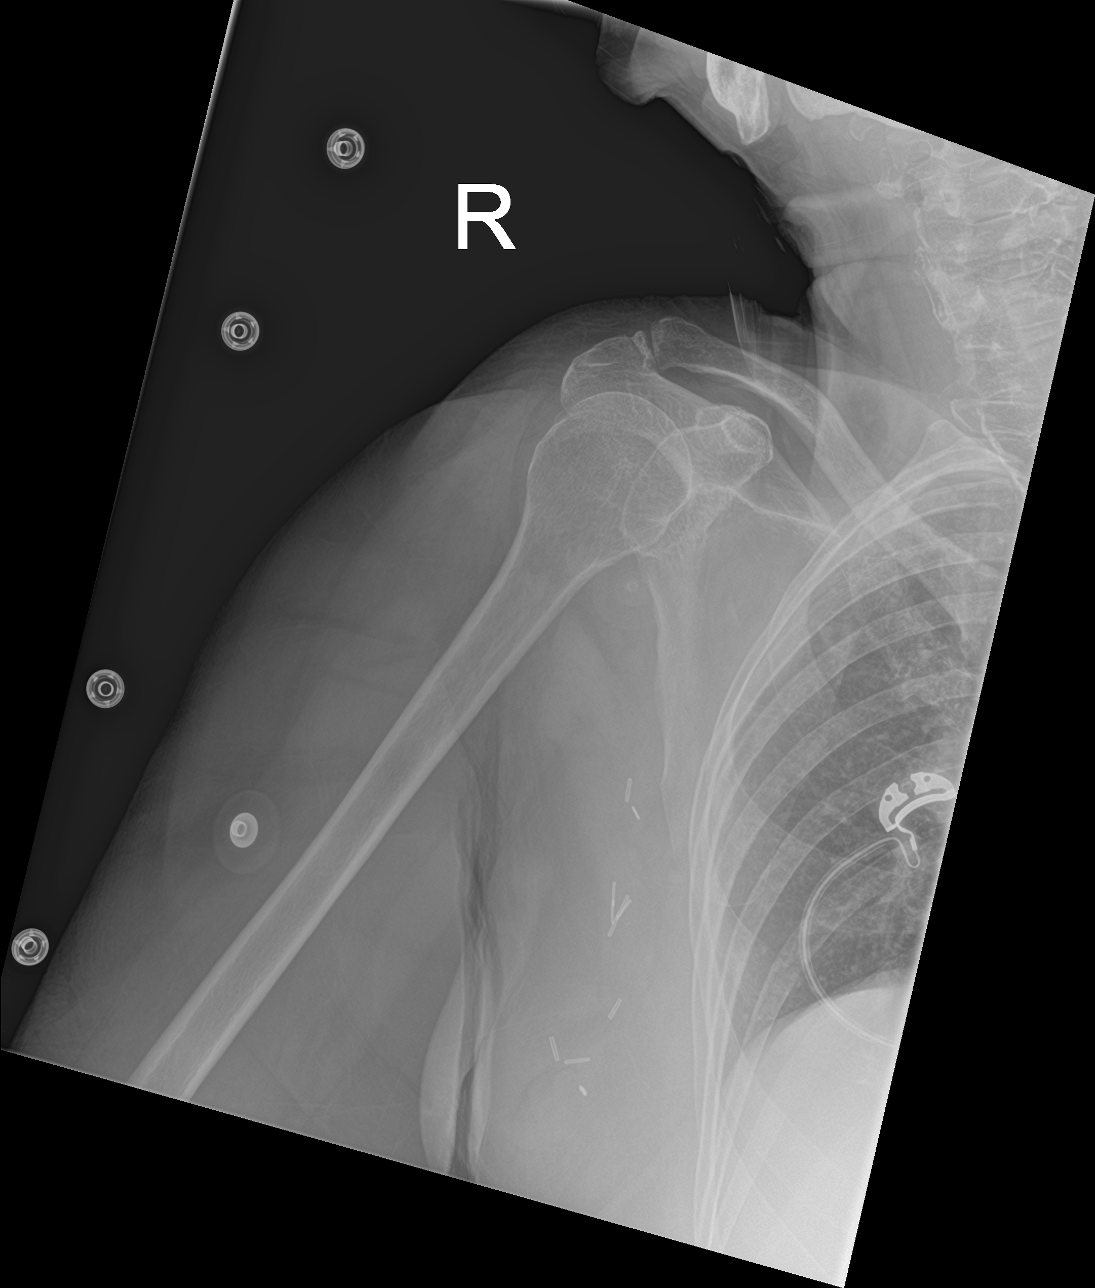

[shoulder y view]
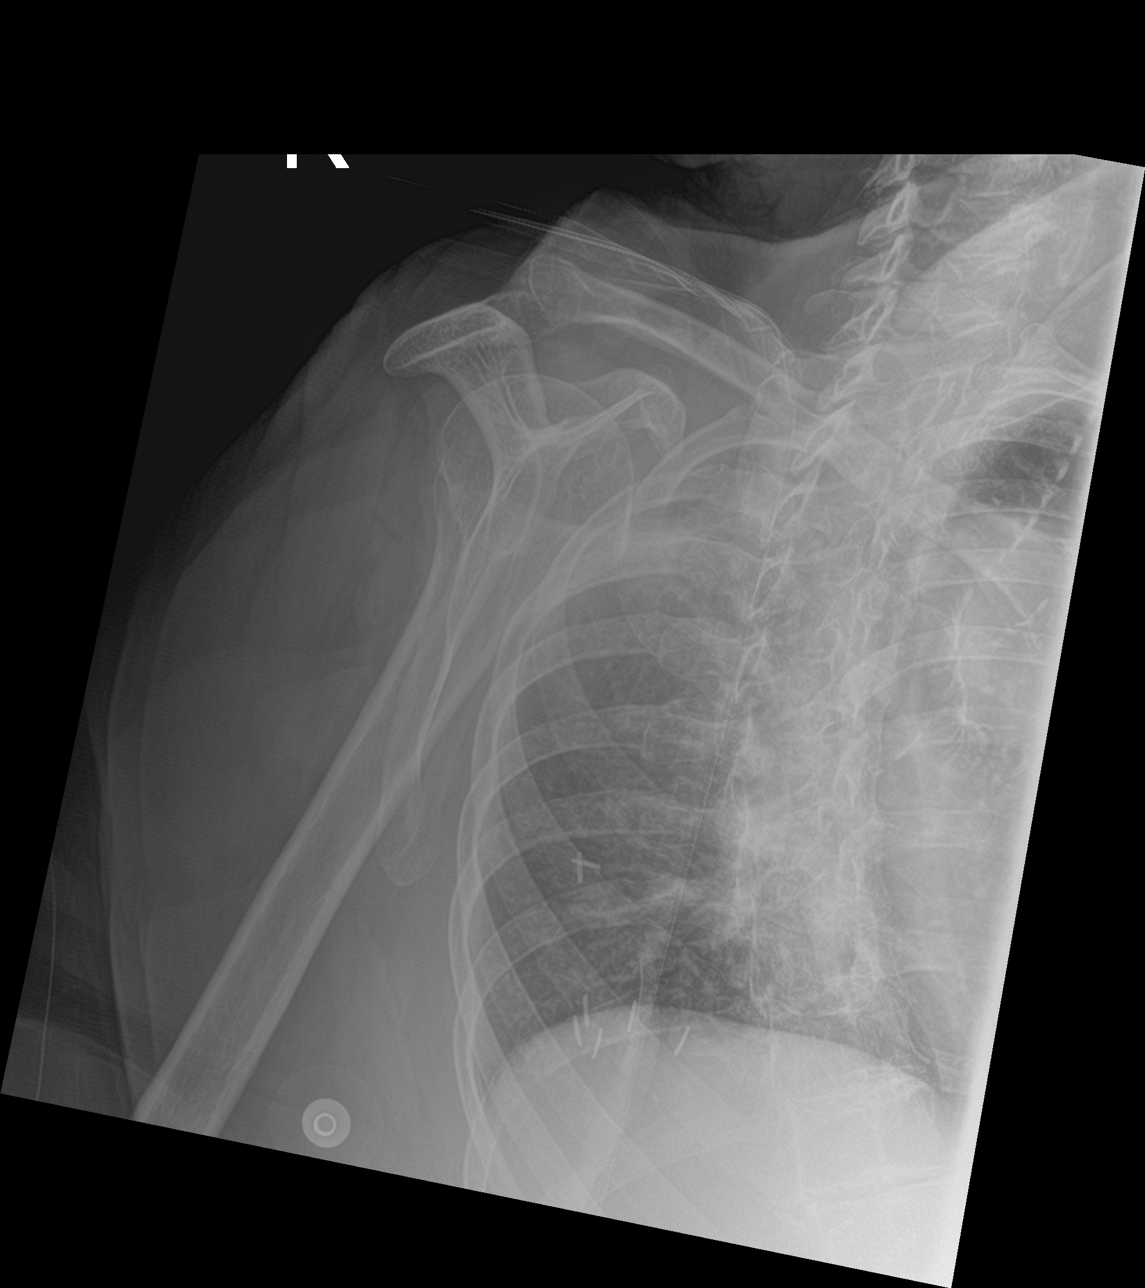

[shoulder ap neutral]
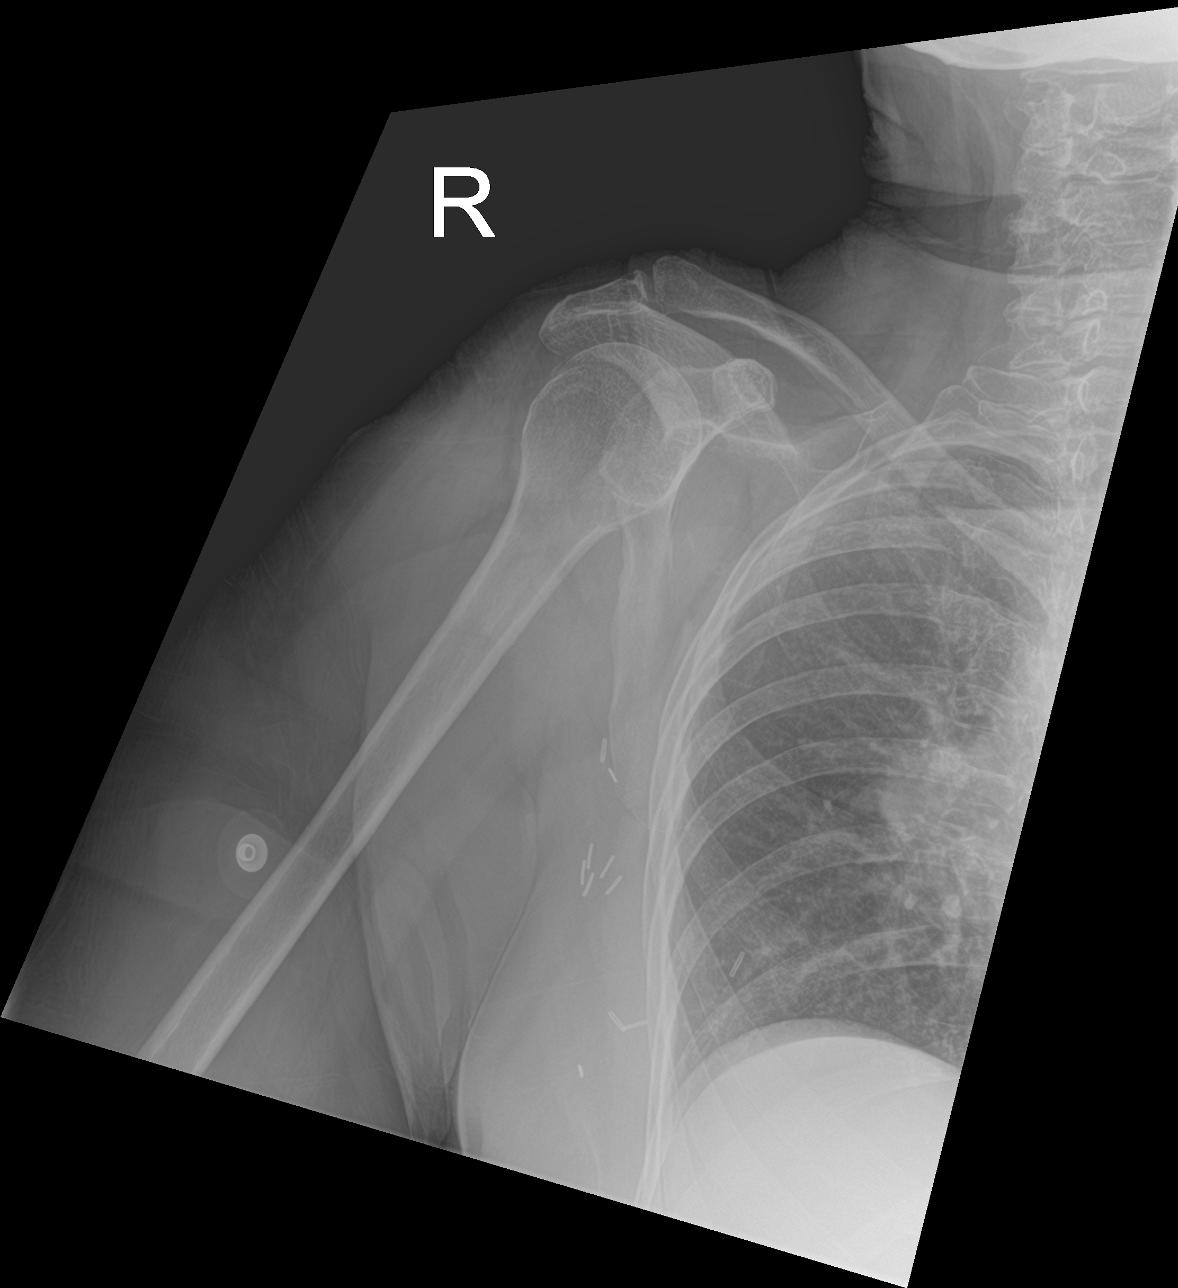

[3 of 3 positions shown; findings below may reference images not displayed]

FINDINGS: No glenohumeral joint dislocation. The proximal right humerus
appears stable and intact. The visible right clavicle in scapula
appear intact. Stable visible right chest and chest wall surgical
clips.
IMPRESSION: No acute fracture or dislocation identified about the right
shoulder.

## 2020-09-01 IMAGING — DX RIGHT ELBOW - COMPLETE 3+ VIEW
3 series · 3 of 3 positions shown · non-contrast
Comparison: Elbow CT 11/24/2018, and earlier.

CLINICAL DATA: 73-year-old female status post fall with pain.
Distal humerus fracture at the elbow earlier this month.

EXAM:
RIGHT ELBOW - COMPLETE 3+ VIEW

[elbow ap (1 of 2)]
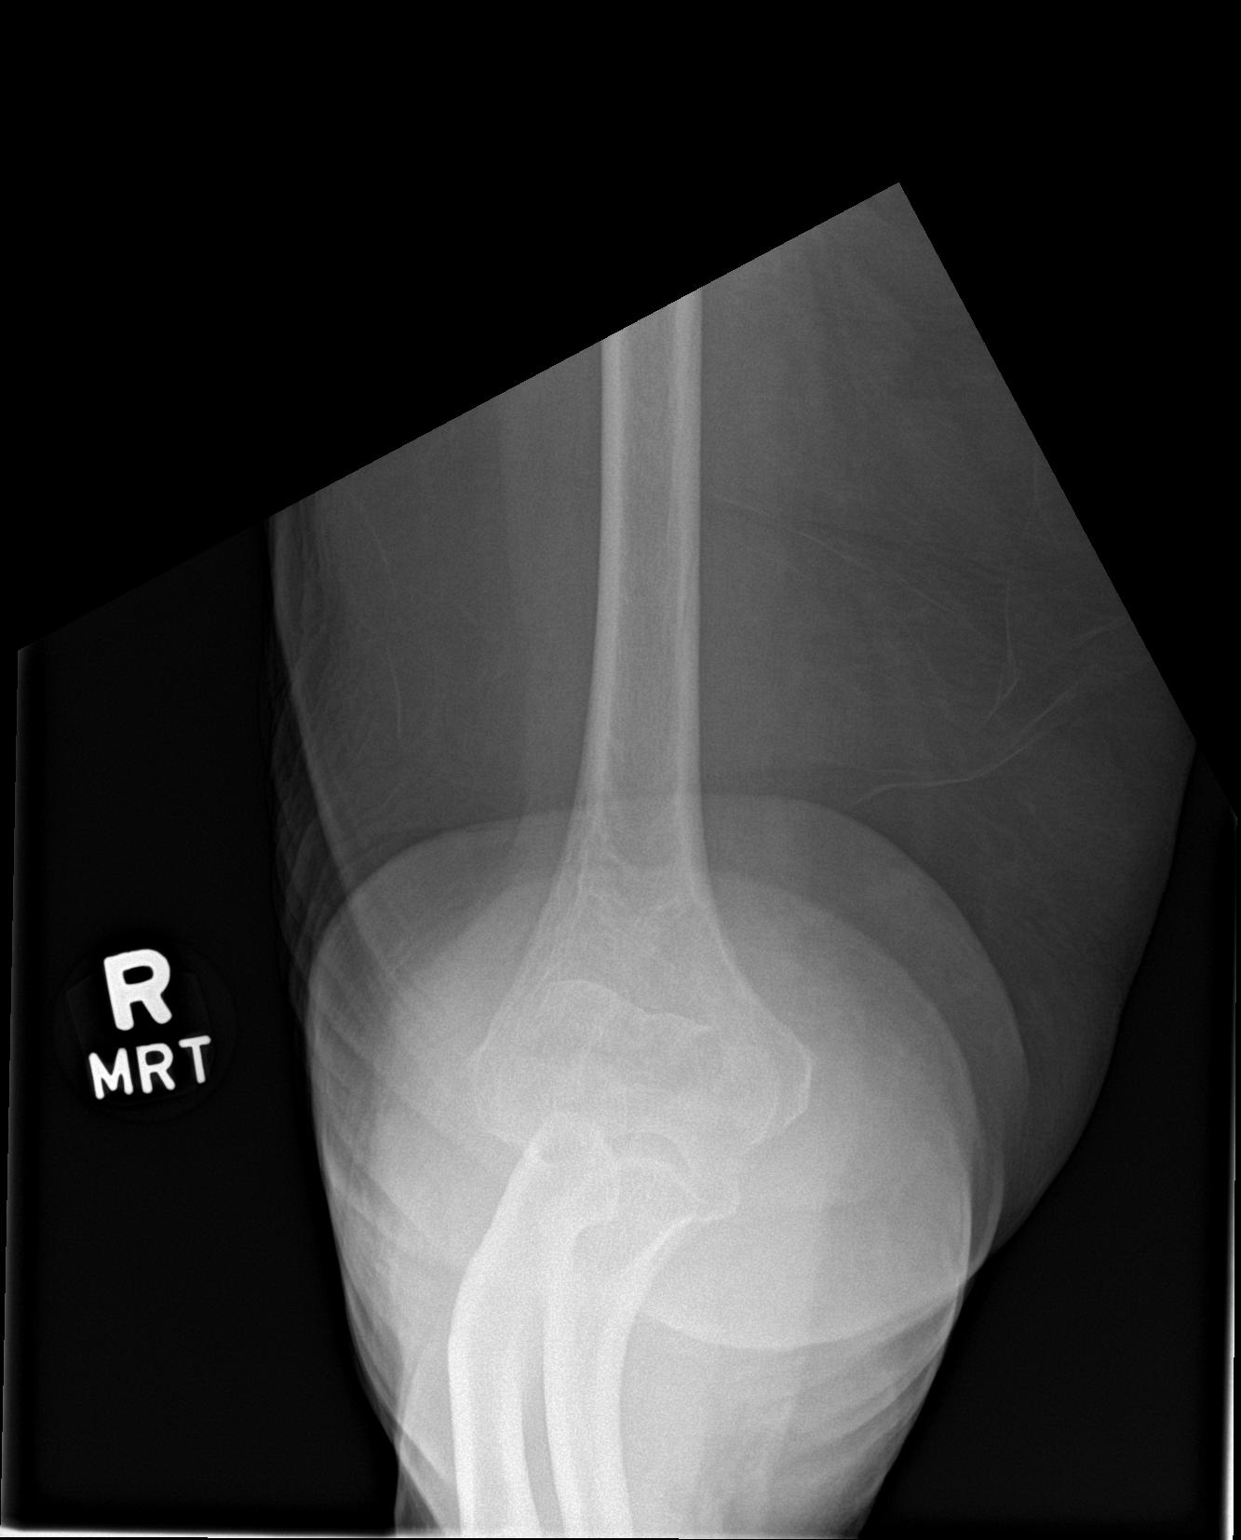

[elbow lat]
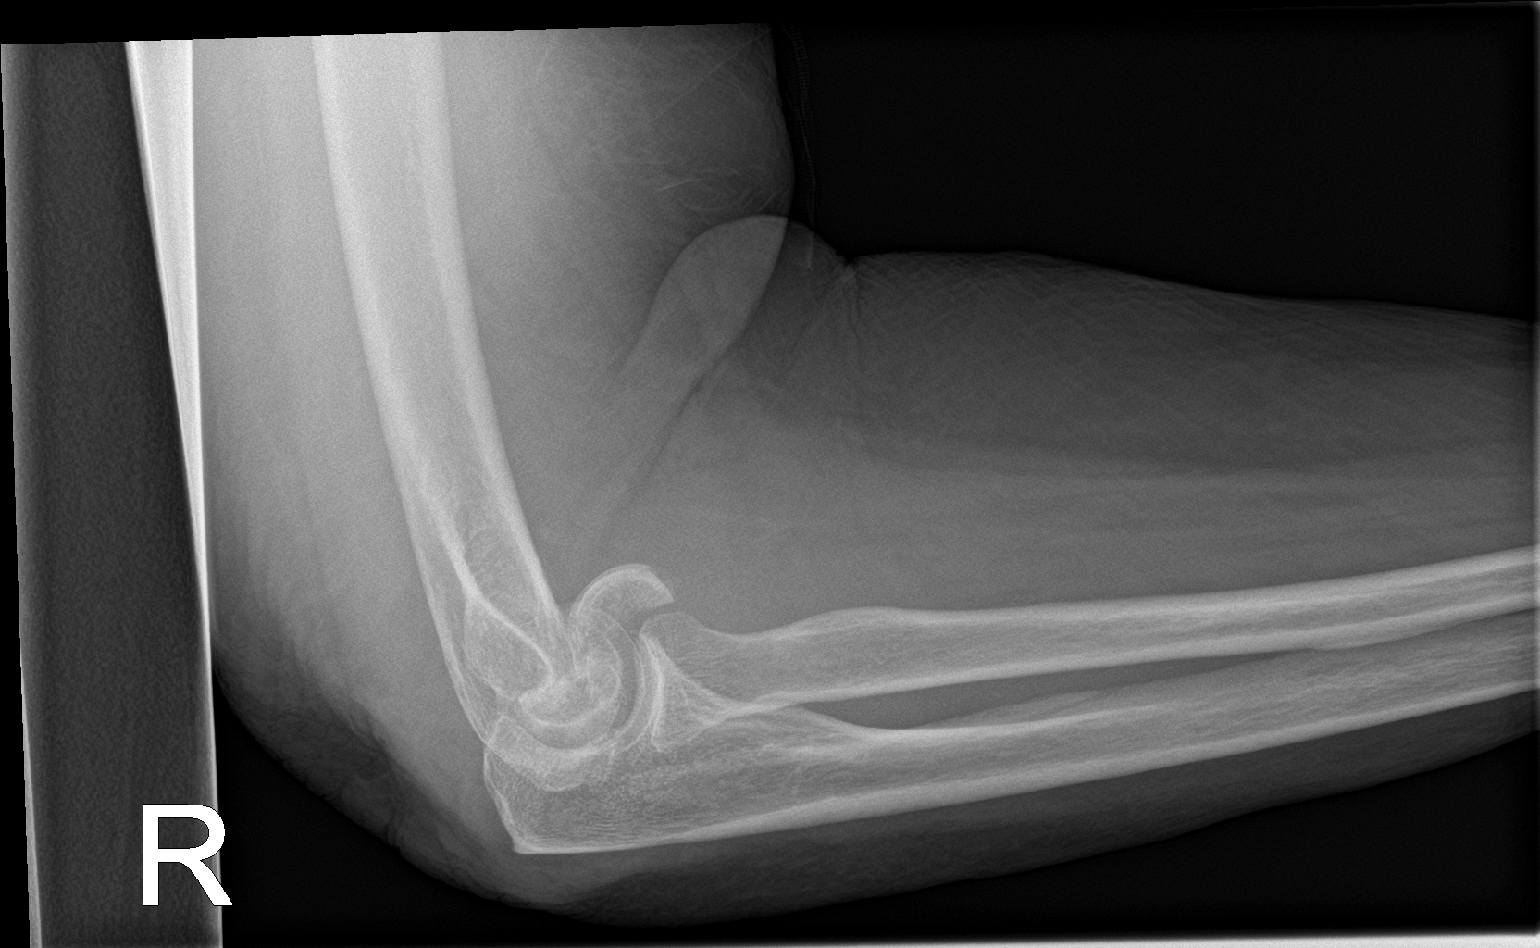

[elbow ap (2 of 2)]
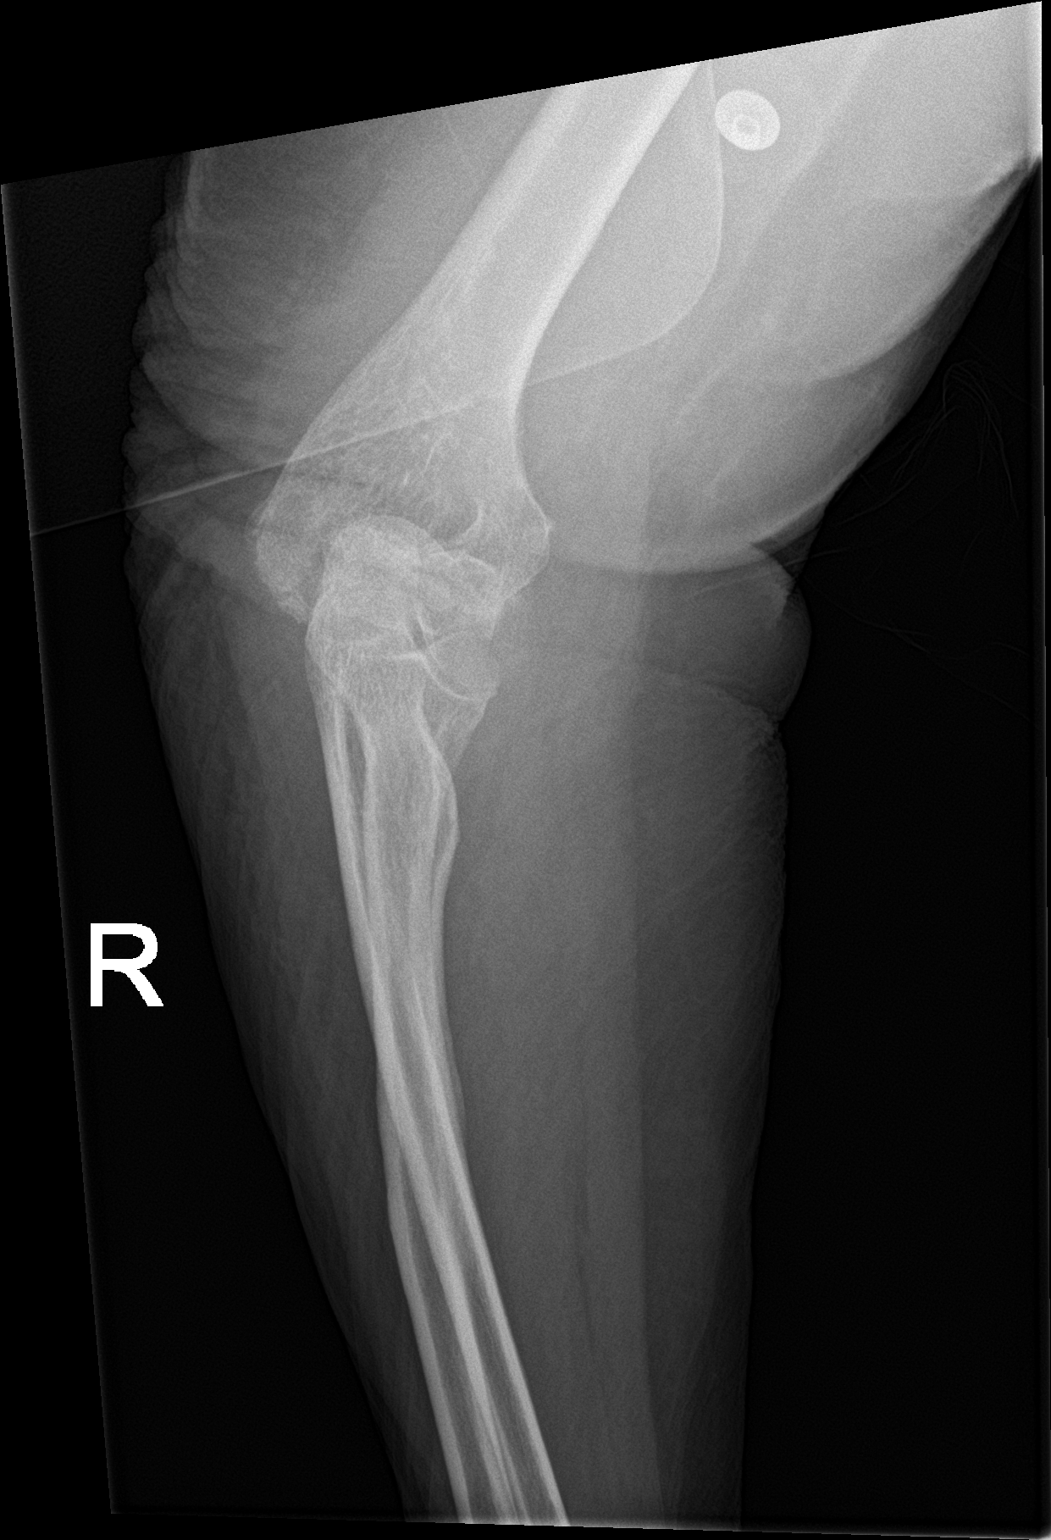

[3 of 3 positions shown; findings below may reference images not displayed]

FINDINGS: Stable radiographic appearance of the elbow since 11/23/2018, with
fractures of the distal humerus with anteriorly rotated fragments of
the capitellum and trochlea as demonstrated by CT. The proximal
radius and ulna appear remain intact. No new No acute osseous
abnormality identified.
IMPRESSION: Stable distal humerus fracture as depicted by CT on 11/24/2018.

## 2020-11-18 NOTE — Progress Notes (Signed)
Patient Care Team: Gaynelle Arabian, MD as PCP - General (Family Medicine) Jerline Pain, MD as PCP - Cardiology (Cardiology) Wonda Horner, MD as Consulting Physician (Gastroenterology) Ralene Ok, MD as Consulting Physician (Surgery) Nicholas Lose, MD as Consulting Physician (Hematology and Oncology) Sylvan Cheese, NP as Nurse Practitioner (Hematology and Oncology) Jovita Kussmaul, MD as Consulting Physician (General Surgery) Thea Silversmith, MD as Consulting Physician (Radiation Oncology)  DIAGNOSIS:    ICD-10-CM   1. Iron deficiency anemia, unspecified iron deficiency anemia type  D50.9 CBC with Differential (Cancer Center Only)    Ferritin    Iron and TIBC    CMP ([place] only)    2. Malignant neoplasm of upper-outer quadrant of right breast in female, estrogen receptor negative ([place])  C50.411 CBC with Differential ([facility] Only)   Z17.1 Ferritin    Iron and TIBC    CMP ([facility] only)      SUMMARY OF ONCOLOGIC HISTORY: Oncology History  Breast cancer of upper-outer quadrant of right female breast ([place])  07/30/2014 Mammogram   Right breast: Mass at 10 o'clock location 4 cm from the nipple measuring 1.7 x 1.3 x 1.3 cm. This corresponds to the palpable and mammographic finding. In the right axilla, there are several lymph nodes which are normal in size measuring up to 34m.    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%), PR- (0%), HER2/neu negative, Ki67 30%    08/27/2014 Clinical Stage   Stage IIA: T2 N0    09/22/2014 Definitive Surgery   Right lumpectomy/SLNB (Marlou Starks: Invasive ductal carcinoma, grade 3, 2.6 cm, high grade DCIS, LVI present, ER-, PR-, 2 LN removed and 1 positive for metastatic carcinoma (1/2)    09/22/2014 Pathologic Stage   Stage IIB: T2 N1 M0    10/15/2014 - 12/16/2014 Chemotherapy   Adjuvant chemotherapy with Taxotere Cytoxan 4 q3 weeks    10/21/2014 -  10/24/2014 Hospital Admission   Admission for neutropenic fever UTI Klebsiella    01/26/2015 - 03/11/2015 Radiation Therapy   Adjuvant RT (Pablo Ledger: Right breast / 45 Gray @ 1.8 GPearline Cablesper fraction x 25 fractions Right supraclavicular fossa/PAB  45 Gy _0 .8 Gy per fraction x 25 fractions Right breast boost / 16 Gray at 2[employer]per fraction x 8 fractions    05/19/2015 Survivorship   Survivorship care plan visit completed.    03/28/2018 Surgery   Laparoscopic cholecystectomy      CHIEF COMPLIANT: Follow-up of breast cancer and iron deficiency anemia  INTERVAL HISTORY: BMARGUARITE MARKOVis a 75y.o. with above-mentioned history of right breast cancer treated with lumpectomy, adjuvant chemotherapy, and radiation who is currently on surveillance. She also has a history of iron deficiency anemia previously treated with IV iron. Labs on 05/20/20 showed Hg 12.7, HCT 38.4, MCV 88.5, Ferritin 54, and Iron sat 22%. She presents to the clinic today for follow-up.  She continues to suffer from neuropathy.  She tells me that the gabapentin is not working and she would like to come off of it.  She takes 600 mg at bedtime.  ALLERGIES:  is allergic to shellfish allergy, aspirin, penicillins, aleve [naproxen], brilinta [ticagrelor], ciprofloxacin, doxycycline, evista [raloxifene], fosamax [alendronate sodium], lactose intolerance (gi), latex, naproxen sodium, nitrofurantoin, pravachol [pravastatin], sulfa antibiotics, tape, welchol [colesevelam hcl], and zocor [simvastatin].  MEDICATIONS:  Current Outpatient Medications  Medication Sig Dispense Refill   acetaminophen (TYLENOL) 500 MG tablet Take 1  tablet (500 mg total) by mouth every 6 (six) hours as needed for moderate pain or headache. 30 tablet 0   ALPRAZolam (XANAX) 0.25 MG tablet Take 0.25 mg by mouth 2 (two) times daily as needed.     aspirin EC 81 MG EC tablet Take 1 tablet (81 mg total) by mouth daily.     calcium carbonate (OSCAL) 1500 (600 Ca) MG TABS  tablet Take by mouth.     Calcium Carbonate-Vit D-Min (CALCIUM 1200 PO) Take by mouth.     Cholecalciferol (VITAMIN D) 2000 UNITS tablet Take 2,000 Units by mouth 2 (two) times daily.      dexlansoprazole (DEXILANT) 60 MG capsule Take 60 mg by mouth daily.     escitalopram (LEXAPRO) 20 MG tablet Take 20 mg by mouth daily.     ezetimibe (ZETIA) 10 MG tablet Take 1 tablet (10 mg total) by mouth at bedtime. 90 tablet 3   famotidine (PEPCID) 20 MG tablet Take 20 mg by mouth at bedtime.      fexofenadine (ALLEGRA) 180 MG tablet Take 180 mg by mouth daily as needed for allergies.      furosemide (LASIX) 20 MG tablet Take one tablet daily as needed for 3 lbs over night weight gain 30 tablet 3   gabapentin (NEURONTIN) 600 MG tablet Take 1 tablet (600 mg total) by mouth 3 (three) times daily. 90 tablet 0   lovastatin (MEVACOR) 40 MG tablet TAKE 1 TABLET BY MOUTH AT  BEDTIME 90 tablet 2   Magnesium 500 MG TABS Take 500 mg by mouth at bedtime.     meclizine (ANTIVERT) 25 MG tablet Take 25 mg by mouth 3 (three) times daily as needed for dizziness.      montelukast (SINGULAIR) 10 MG tablet Take 10 mg by mouth at bedtime.     nitroGLYCERIN (NITROSTAT) 0.4 MG SL tablet Place 1 tablet (0.4 mg total) under the tongue every 5 (five) minutes as needed for chest pain (CP or SOB). 25 tablet 6   nystatin cream (MYCOSTATIN) Apply 1 application topically 2 (two) times daily. 30 g 4   Polyvinyl Alcohol-Povidone PF (REFRESH) 1.4-0.6 % SOLN Place 2 drops into both eyes 3 (three) times daily as needed (for dry eyes).     potassium chloride (K-DUR) 10 MEQ tablet Take 10 mEq by mouth every 3 (three) days.     triamcinolone cream (KENALOG) 0.1 % Apply 1 application topically 2 (two) times daily. 30 g 4   vitamin C (ASCORBIC ACID) 500 MG tablet Take 500 mg by mouth daily.     Zinc Acetate, Oral, (ZINC ACETATE PO) Take by mouth.     No current facility-administered medications for this visit.    PHYSICAL EXAMINATION: ECOG  PERFORMANCE STATUS: 1 - Symptomatic but completely ambulatory  Vitals:   11/19/20 1145  BP: (!) 145/67  Pulse: 60  Resp: 18  Temp: (!) 97.5 F (36.4 C)  SpO2: 97%   Filed Weights   11/19/20 1145  Weight: 165 lb 1.6 oz (74.9 kg)    BREAST: No palpable masses or nodules in either right or left breasts. No palpable axillary supraclavicular or infraclavicular adenopathy no breast tenderness or nipple discharge. (exam performed in the presence of a chaperone)  LABORATORY DATA:  I have reviewed the data as listed CMP Latest Ref Rng & Units 05/20/2020 11/19/2019 03/26/2019  Glucose 70 - 99 mg/dL 143(H) 123(H) 91  BUN 8 - 23 mg/dL _0 Creatinine 0.44 - 1.00 mg/dL  0.83 0.80 0.85  Sodium 135 - 145 mmol/L 141 140 139  Potassium 3.5 - 5.1 mmol/L 3.7 4.0 4.6  Chloride 98 - 111 mmol/L 104 103 99  CO2 22 - 32 mmol/L _0 Calcium 8.9 - 10.3 mg/dL 8.7(L) 8.8(L) 9.0  Total Protein 6.5 - 8.1 g/dL 7.6 7.2 7.1  Total Bilirubin 0.3 - 1.2 mg/dL 0.5 0.4 0.4  Alkaline Phos 38 - 126 U/L 72 74 91  AST 15 - 41 U/L 12(L) 15 17  ALT 0 - 44 U/L _1 Lab Results  Component Value Date   WBC 5.9 11/19/2020   HGB 12.1 11/19/2020   HCT 36.1 11/19/2020   MCV 89.4 11/19/2020   PLT 208 11/19/2020   NEUTROABS 3.9 11/19/2020    ASSESSMENT & PLAN:  Breast cancer of upper-outer quadrant of right female breast ([place]) Diagnosed 2016 treated with lumpectomy, Taxotere/Cytoxan as well as radiation therapy, triple negative   Breast cancer surveillance: 1.  Breast exam on 11/19/2020: Benign no palpable lumps or nodules 2.  Mammogram 03/17/2020 n benign breast density category C  3.  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 Peripheral neuropathy secondary to chemotherapy: I recommended participation in the neuropathy clinical trial. We will provide her information about the study.      Orders Placed This Encounter  Procedures   CBC with Differential ([facility] Only)    Standing Status:   Future    Standing Expiration Date:   11/19/2021   Ferritin    Standing Status:   Future    Standing Expiration Date:   11/19/2021   Iron and TIBC    Standing Status:   Future    Standing Expiration Date:   11/19/2021   CMP ([facility] only)    Standing Status:   Future    Standing Expiration Date:   11/19/2021    The patient has a good understanding of the overall plan. she agrees with it. she will call with any problems that may develop before the next visit here.  Total time spent: 20 mins including face to face time and time spent for planning, charting and coordination of care  Rulon Eisenmenger, MD, MPH 11/19/2020  I, Thana Ates, am acting as scribe for Dr. Nicholas Lose.  I have reviewed the above documentation for accuracy and completeness, and I agree with the above.

## 2020-11-19 ENCOUNTER — Encounter: Payer: Self-pay | Admitting: *Deleted

## 2020-11-19 ENCOUNTER — Inpatient Hospital Stay: Payer: Medicare Other

## 2020-11-19 ENCOUNTER — Other Ambulatory Visit: Payer: Self-pay

## 2020-11-19 ENCOUNTER — Inpatient Hospital Stay: Payer: Medicare Other | Attending: Hematology and Oncology | Admitting: Hematology and Oncology

## 2020-11-19 VITALS — BP 145/67 | HR 60 | Temp 97.5°F | Resp 18 | Ht <= 58 in | Wt 165.1 lb

## 2020-11-19 DIAGNOSIS — Z171 Estrogen receptor negative status [ER-]: Secondary | ICD-10-CM | POA: Diagnosis not present

## 2020-11-19 DIAGNOSIS — Z9221 Personal history of antineoplastic chemotherapy: Secondary | ICD-10-CM | POA: Diagnosis not present

## 2020-11-19 DIAGNOSIS — Z923 Personal history of irradiation: Secondary | ICD-10-CM | POA: Diagnosis not present

## 2020-11-19 DIAGNOSIS — Z853 Personal history of malignant neoplasm of breast: Secondary | ICD-10-CM | POA: Insufficient documentation

## 2020-11-19 DIAGNOSIS — D509 Iron deficiency anemia, unspecified: Secondary | ICD-10-CM | POA: Insufficient documentation

## 2020-11-19 DIAGNOSIS — M8580 Other specified disorders of bone density and structure, unspecified site: Secondary | ICD-10-CM | POA: Diagnosis not present

## 2020-11-19 DIAGNOSIS — C50411 Malignant neoplasm of upper-outer quadrant of right female breast: Secondary | ICD-10-CM

## 2020-11-19 LAB — CBC WITH DIFFERENTIAL (CANCER CENTER ONLY)
Abs Immature Granulocytes: 0.02 10*3/uL (ref 0.00–0.07)
Basophils Absolute: 0 10*3/uL (ref 0.0–0.1)
Basophils Relative: 1 %
Eosinophils Absolute: 0.2 10*3/uL (ref 0.0–0.5)
Eosinophils Relative: 4 %
HCT: 36.1 % (ref 36.0–46.0)
Hemoglobin: 12.1 g/dL (ref 12.0–15.0)
Immature Granulocytes: 0 %
Lymphocytes Relative: 23 %
Lymphs Abs: 1.4 10*3/uL (ref 0.7–4.0)
MCH: 30 pg (ref 26.0–34.0)
MCHC: 33.5 g/dL (ref 30.0–36.0)
MCV: 89.4 fL (ref 80.0–100.0)
Monocytes Absolute: 0.4 10*3/uL (ref 0.1–1.0)
Monocytes Relative: 7 %
Neutro Abs: 3.9 10*3/uL (ref 1.7–7.7)
Neutrophils Relative %: 65 %
Platelet Count: 208 10*3/uL (ref 150–400)
RBC: 4.04 MIL/uL (ref 3.87–5.11)
RDW: 13.8 % (ref 11.5–15.5)
WBC Count: 5.9 10*3/uL (ref 4.0–10.5)
nRBC: 0 % (ref 0.0–0.2)

## 2020-11-19 LAB — CMP (CANCER CENTER ONLY)
ALT: 8 U/L (ref 0–44)
AST: 14 U/L — ABNORMAL LOW (ref 15–41)
Albumin: 3.2 g/dL — ABNORMAL LOW (ref 3.5–5.0)
Alkaline Phosphatase: 81 U/L (ref 38–126)
Anion gap: 9 (ref 5–15)
BUN: 10 mg/dL (ref 8–23)
CO2: 30 mmol/L (ref 22–32)
Calcium: 8.8 mg/dL — ABNORMAL LOW (ref 8.9–10.3)
Chloride: 101 mmol/L (ref 98–111)
Creatinine: 0.81 mg/dL (ref 0.44–1.00)
GFR, Estimated: >60 mL/min (ref >60)
Glucose, Bld: 83 mg/dL (ref 70–99)
Potassium: 4.3 mmol/L (ref 3.5–5.1)
Sodium: 140 mmol/L (ref 135–145)
Total Bilirubin: 0.5 mg/dL (ref 0.3–1.2)
Total Protein: 7.4 g/dL (ref 6.5–8.1)

## 2020-11-19 LAB — IRON AND TIBC
Iron: 76 ug/dL (ref 41–142)
Saturation Ratios: 22 % (ref 21–57)
TIBC: 346 ug/dL (ref 236–444)
UIBC: 270 ug/dL (ref 120–384)

## 2020-11-19 LAB — FERRITIN: Ferritin: 31 ng/mL (ref 11–307)

## 2020-11-19 NOTE — Research (Signed)
ACCRU-[facility]-2102 - TREATMENT OF ESTABLISHED CHEMOTHERAPY-INDUCED NEUROPATHY WITH N-PALMITOYLETHANOLAMIDE, A CANNABIMIMETIC NUTRACEUTICAL: A RANDOMIZED DOUBLE-BLIND PHASE II PILOT TRIAL  Patient Angela Bond was identified by Dr. Lindi Adie as a potential candidate for the above listed study.  This Clinical Research Nurse met with [name], [id number], on 11/19/20 in a manner and location that ensures patient privacy to discuss participation in the above listed research study.  Patient is Unaccompanied.  A copy of the informed consent document with embedded HIPAA language was provided to the patient.  Patient reads, speaks, and understands [place].   Patient was provided with the business card of this Nurse and encouraged to contact the research team with any questions.  Approximately 25 minutes was spent with the patient reviewing the informed consent documents.  Patient was provided the option of taking informed consent documents home to review and was encouraged to review at their convenience with their support network, including other care providers. Patient took the consent documents home to review.  The pt stated that her neuropathy symptoms mainly affect her feet.  She says that her feet burn and are painful.  The pt rates her neuropathy symptoms a "8" on a 1-10 scale.  The nurse will begin to review the pt's medical chart and the eligibility criteria.  The pt agreed to read the consent form over the weekend.  The pt and the nurse agreed to speak on Monday, July 11 th about her participation in the study.  The pt was thanked for her interest in the study.  The pt said that she does not want to stay on gabapentin any longer due to the side effects.  Dr. Lindi Adie and the pt agreed to wean her current dose of gabapentin over the next week.  Brion Aliment RN, BSN, [facility] Nurse Lead 11/19/2020 1:25 PM

## 2020-11-19 NOTE — Assessment & Plan Note (Signed)
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  Recheck labs and follow-up in6 months

## 2020-11-20 ENCOUNTER — Telehealth: Payer: Self-pay | Admitting: Hematology and Oncology

## 2020-11-20 NOTE — Telephone Encounter (Signed)
Scheduled appointment per 07/07 los. Patient is aware.

## 2020-11-23 ENCOUNTER — Telehealth: Payer: Self-pay | Admitting: *Deleted

## 2020-11-23 NOTE — Telephone Encounter (Signed)
ACCRU-[place]-2102 - TREATMENT OF ESTABLISHED CHEMOTHERAPY-INDUCED NEUROPATHY WITH N-PALMITOYLETHANOLAMIDE, A CANNABIMIMETIC NUTRACEUTICAL: A RANDOMIZED DOUBLE-BLIND PHASE II PILOT TRIAL   The research nurse called the pt to discuss her participation in the above study.  The pt said that her birthday was today, and she had read half the consent form.  The nurse wished the pt a "Happy Birthday". The pt was informed that she appears eligible for study entry if she is interested in the study.  The pt states that she has not taken any doses of her gabapentin since [facility].  The pt was encouraged to continue reading the consent form.  The nurse agreed to call the pt tomorrow to further discuss the study and to answer any questions she has about the study.  The pt was thanked for her continued interest in the study. Brion Aliment RN, BSN, CCRP Clinical Research Nurse Lead 11/23/2020 3:29 PM

## 2020-11-24 ENCOUNTER — Telehealth: Payer: Self-pay | Admitting: *Deleted

## 2020-11-24 NOTE — Telephone Encounter (Signed)
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  The pt said that she has been reading the consent form.  The pt asked the nurse if she could take Tylenol is she enrolled into the study for pain control.  The pt was informed that Tylenol is allowed.  The pt was told that she cannot take the following medications:  opiods, duloxetine, gabapentin or pregabalin.  The pt confirmed that she last took a dose of gabapentin on 11/18/20.  She said that she did not take a dose of gabapentin on 11/19/20.  The pt agreed to meet with the research nurse on Friday, 11/1520 at 11 am to go over the consent form in person.  The pt said that she would continue to read over the consent form until Friday's appt.  The pt was thanked for her interest in the study.  A 2nd preliminary review of patient eligibility was completed by Ruben Im, RN study coordinator.  She felt the pt was eligible for study enrollment.  If the pt signs consent on Friday, then the nurse will have a 2nd nurse review the pt's inclusion/exclusion criteria and the stratification factors for confirmation that the pt meets all the criteria for study entry.  Dr. Lindi Adie was notified that the pt is interested in participation in the study.  Brion Aliment RN, BSN, CCRP Clinical Research Nurse Lead 11/24/2020 4:00 PM

## 2020-11-27 ENCOUNTER — Other Ambulatory Visit: Payer: Self-pay

## 2020-11-27 ENCOUNTER — Encounter: Payer: Self-pay | Admitting: *Deleted

## 2020-11-27 ENCOUNTER — Inpatient Hospital Stay: Payer: Medicare Other | Admitting: *Deleted

## 2020-11-27 DIAGNOSIS — C50411 Malignant neoplasm of upper-outer quadrant of right female breast: Secondary | ICD-10-CM

## 2020-11-27 DIAGNOSIS — Z171 Estrogen receptor negative status [ER-]: Secondary | ICD-10-CM

## 2020-11-27 NOTE — Research (Signed)
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 Lindi Adie as a potential candidate for the above listed study.  This Clinical Research Nurse met with Angela Bond, Angela Bond on 11/27/20 in a manner and location that ensures patient privacy to discuss participation in the above listed research study.  Patient is Unaccompanied.  Patient was previously provided with informed consent documents.  Patient confirmed they have read the informed consent documents.  As outlined in the informed consent form, this Nurse and Angela Bond discussed the purpose of the research study, the investigational nature of the study, study procedures and requirements for study participation, potential risks and benefits of study participation, as well as alternatives to participation.  This study is blinded or double-blinded. The patient understands participation is voluntary and they may withdraw from study participation at any time.  Each study arm was reviewed, and randomization discussed.  Potential side effects were reviewed with patient as outlined in the consent form, and patient made aware there may be side effects not yet known. The chance of receiving placebo was discussed. Patient understands enrollment is pending full eligibility review.   Confidentiality and how the patient's information will be used as part of study participation were discussed.  Patient was informed there is not reimbursement provided for their time and effort spent on trial participation.  The patient is encouraged to discuss research study participation with their insurance provider to determine what costs they may incur as part of study participation, including research related injury.    All questions were answered to patient's satisfaction.  The informed consent  with embedded HIPAA language was reviewed page by page.  The patient's mental and emotional status is appropriate to provide informed consent, and the patient verbalizes an understanding of study participation.  Patient has agreed to participate in the above listed research study and has voluntarily signed the informed consent version 07/03/2020 with embedded HIPAA language, version 07/03/2020  on 11/27/20 at 11:40AM. The patient was provided with a copy of the signed informed consent form with embedded HIPAA language for their reference.  No study specific procedures were obtained prior to the signing of the informed consent document.  Approximately 50 minutes were spent with the patient reviewing the informed consent documents.  After obtaining informed consent patient, voluntarily signed the optional Release of Information form for use throughout trial participation.   This Nurse has reviewed this patient's inclusion and exclusion criteria and confirmed Angela Bond is eligible for study participation.  Patient will continue with enrollment. The pt completed the Screening Questionnaire today after signing the consent form.  The pt rated her numbness in the past week as "8" on a 0-10 scale.  She confirmed that she can swallow pills. She denies taking any known medications/supplements containing palmitoylethanolamide (PEA).  She denies currently taking or planning on taking the following medications:  Opiods, Cymbalta, Gabapentin or Lyrica. She confirmed that she has been off her Gabapentin for greater than 1 week.  Her last dose of Gabapentin was on 11/18/20 per pt report.  The pt denies currently taking or planning to take any cannabis products [facility] and/or CBD).  The 2nd RN review was completed by Ruben Im.  The pt will be registered on Monday, 11/30/20.  The pt is scheduled to come to the [facility] on 12/03/20 to pick up her study drug and her packet of questionnaires.  The pt will be seen by Ruben Im RN study  coordinator.    Menopausal status (women only): Angela Bond is post menopausal.  The pt is 75 years old, the pt's LMP is unknown.  The pt said that her LMP was "many years ago".  Therefore, a pregnancy test is not indicated.    Angela Aliment RN, BSN, CCRP Clinical Research Nurse Lead 11/27/2020 1:26 PM

## 2020-11-27 NOTE — Research (Signed)
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  Patient may continue with enrollment.  Wells Guiles '[place], RN, BSN Clinical Research Nurse I 11/27/20 3:15 PM

## 2020-11-30 ENCOUNTER — Encounter: Payer: Self-pay | Admitting: Emergency Medicine

## 2020-11-30 DIAGNOSIS — C50411 Malignant neoplasm of upper-outer quadrant of right female breast: Secondary | ICD-10-CM

## 2020-11-30 DIAGNOSIS — Z171 Estrogen receptor negative status [ER-]: Secondary | ICD-10-CM

## 2020-11-30 NOTE — Research (Signed)
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  Stratification criteria were confirmed on 11/27/20 by this clinical research Nurse and clinical research Nurse Doristine Johns verified the stratification factors used for randomization.  These include sex (female), and prior chemotherapy received (any taxane +/- carboplatin). As part of the assigned treatment arm, patient [name] will receive the study agent twice daily taken for 8 weeks with food. MD will be notified of patient assignment; treatment will begin 12/03/2020.  Per study protocol, patient [name] will not be notified of their treatment assignment. Patient [name] is successfully enrolled in the above study.  Confirmed drug availability with PharmD Raul Del (unblinded pharmacist).  Wells Guiles '[place], RN, BSN Clinical Research Nurse I 12/03/20 9:28 AM

## 2020-11-30 NOTE — Research (Signed)
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 Randomization is required for this study. Randomization information to follow.   Wells Guiles '[place], RN, BSN Clinical Research Nurse I 11/30/20 1:55 PM

## 2020-12-03 ENCOUNTER — Inpatient Hospital Stay: Payer: Medicare Other | Admitting: Emergency Medicine

## 2020-12-03 ENCOUNTER — Encounter: Payer: Self-pay | Admitting: Hematology and Oncology

## 2020-12-03 ENCOUNTER — Other Ambulatory Visit: Payer: Self-pay

## 2020-12-03 DIAGNOSIS — Z171 Estrogen receptor negative status [ER-]: Secondary | ICD-10-CM

## 2020-12-03 DIAGNOSIS — C50411 Malignant neoplasm of upper-outer quadrant of right female breast: Secondary | ICD-10-CM

## 2020-12-03 MED ORDER — INV-PALMITOYLETHANOLAMIDE/PLACEBO 400 MG CAPS ACCRU-~~LOC~~-2102: ORAL_CAPSULE | ORAL | 0 refills | Status: DC

## 2020-12-03 NOTE — Research (Signed)
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   PROs: Per study protocol, all PROs required for this visit were completed prior to other study activities and completeness has been verified.  Collected and verified completion of Baseline PROs (which patient did on day of registration- 11/30/20) and provided patient with Weeks 1-8 of PRO Questionnaires to return weekly by mail via prepaid envelopes.  Pt aware to complete questionnaires every Wednesday.   MEDICATION REVIEW: Patient reviews and verifies the current medication list is correct, as well as her allergies.  Denies any changes in medications since last visit with Research Nurse.  MEDICATION DIARIES and IRT:  Confirmed study drug bottle patient labels and pill counts with PharmD Raul Del before distributing to patient.  Four pill bottles with 30 pills in each (120 total) were provided to patient.  Patient was instructed to start taking study drug today, taking one capsule twice daily every morning and evening with food.  Pt took today's morning dose in clinic and was observed for 20 minutes after per pt request, tolerated well.   Will call pt on Wednesday next week (12/09/20) for Week 1 phone call per protocol, pt aware to expect call.  DISPOSITION: Upon completion off all study requirements, patient was escorted to exit with belongings.   The patient was thanked for their time and continued voluntary participation in this study. Patient Angela Bond has been provided direct contact information and is encouraged to contact this Nurse for any needs or questions.  Wells Guiles '[place], RN, BSN Clinical Research Nurse I 12/03/20 11:40 AM

## 2020-12-09 ENCOUNTER — Encounter: Payer: Self-pay | Admitting: *Deleted

## 2020-12-09 DIAGNOSIS — C50411 Malignant neoplasm of upper-outer quadrant of right female breast: Secondary | ICD-10-CM

## 2020-12-09 DIAGNOSIS — Z171 Estrogen receptor negative status [ER-]: Secondary | ICD-10-CM

## 2020-12-09 NOTE — Research (Signed)
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 Identity was confirmed using two patient identifiers.              QUESTIONNAIRE: Patient reports that she will complete her questionnaires this afternoon, and she will mail them this evening.    INVESTIGATIONAL PRODUCT: Patient reports taking 13 capsules since 12/03/2020 (started taking drug that morning twice daily in AM and PM).  Patient is always compliant with study medication. Patient is reminded on the importance of continuing study medication compliance, verbalizes understanding.   ADVERSE EVENTS: USING CTCAE V. 5.0 Per protocol (Appendix VIII) pt was asked about nausea, vomiting, and diarrhea.  Pt denies nausea and vomiting. Patient Angela Bond reports a history of baseline diarrhea that she treats with Milk of Magnesia as needed.  No change in baseline status.  Pt specifically states that her occasional, mild diarrhea is not new and has not worsened or changed from baseline.   Pt does not report any other adverse events or effects. She does report ongoing neuropathy symptoms.  She said that her feet tingles and hurts when she walks too much.  The pt said that she plans on buying some new shoes to see if that would relieve some of her symptoms.     FOLLOW-UP: Patient Denies concerns at this time.  Patients agrees to next follow-up phone call on 12/16/2020 around 1 pm.     MEDICATIONS: Patient denies any changes in her medications or other treatments (per protocol, Appendix VIII).  Patient denies taking any other cannabinoid products like CBD and/or THC, was re-educated on importance of not taking them while on study.  Pt verbalizes understanding of instructions.  The pt reports taking Tylenol for  her neuropathy symptoms.      The patient was thanked for their time and continued voluntary participation in this study. Patient has been provided direct contact information and is encouraged to contact this Nurse for any needs or questions.  Brion Aliment RN, BSN, CCRP Clinical Research Nurse Lead 12/09/2020 2:33 PM

## 2020-12-14 ENCOUNTER — Encounter: Payer: Self-pay | Admitting: Hematology and Oncology

## 2020-12-16 ENCOUNTER — Encounter: Payer: Self-pay | Admitting: *Deleted

## 2020-12-16 DIAGNOSIS — C50411 Malignant neoplasm of upper-outer quadrant of right female breast: Secondary | ICD-10-CM

## 2020-12-16 DIAGNOSIS — Z171 Estrogen receptor negative status [ER-]: Secondary | ICD-10-CM

## 2020-12-16 NOTE — Research (Signed)
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 Identity was confirmed using two patient identifiers. The pt states that she has been doing "good" during the past week.                QUESTIONNAIRE: Patient reports that she will complete her questionnaires this afternoon, and she will mail them tomorrow. The nurse thanked the pt for mailing her week 1 questionnaires.  The site received them on 12/15/20.    INVESTIGATIONAL PRODUCT: Patient reports taking 13 capsules since 12/10/2020 (started taking drug that morning twice daily in AM and PM).  Patient stated she is always compliant with study medication. Patient is reminded on the importance of continuing study medication compliance, verbalizes understanding.   ADVERSE EVENTS: USING CTCAE V. 5.0 Per protocol (Appendix VIII) pt was asked about nausea, vomiting, and diarrhea.  Pt denies nausea and vomiting. Patient Angela Bond reports a history of baseline diarrhea that she treats with Milk of Magnesia as needed.  No change in baseline status per patient report.  Pt specifically states that her occasional, mild diarrhea is not a new symptom.  The pt said that she has 1 loose stool after breakfast, and then she is fine the rest of the day.  The pt stated that her mild diarrhea has not worsened since starting the study drug.  Pt does not report any other adverse events or effects. She does report ongoing neuropathy symptoms.  She states that her neuropathy in her feet has "decreased".  She said that she has not bought any new shoes yet.     FOLLOW-UP: Patient Denies concerns at this time.  Patients agrees to next follow-up phone call on 12/23/2020 around 1 pm.     MEDICATIONS: Patient denies any changes in her  medications or other treatments (per protocol, Appendix VIII).  Patient denies taking any other cannabinoid products like CBD and/or THC, was re-educated on importance of not taking them while on study.  Pt verbalizes understanding of instructions.      The patient was thanked for their time and continued voluntary participation in this study. Patient has been provided direct contact information and is encouraged to contact this Nurse for any needs or questions.  Brion Aliment RN, BSN, [place] Nurse Lead 12/16/2020 1:13 PM

## 2020-12-21 DIAGNOSIS — Z03818 Encounter for observation for suspected exposure to other biological agents ruled out: Secondary | ICD-10-CM | POA: Diagnosis not present

## 2020-12-21 DIAGNOSIS — R5381 Other malaise: Secondary | ICD-10-CM | POA: Diagnosis not present

## 2020-12-21 DIAGNOSIS — Z20822 Contact with and (suspected) exposure to covid-19: Secondary | ICD-10-CM | POA: Diagnosis not present

## 2020-12-23 ENCOUNTER — Encounter: Payer: Self-pay | Admitting: *Deleted

## 2020-12-23 DIAGNOSIS — C50411 Malignant neoplasm of upper-outer quadrant of right female breast: Secondary | ICD-10-CM

## 2020-12-23 DIAGNOSIS — Z171 Estrogen receptor negative status [ER-]: Secondary | ICD-10-CM

## 2020-12-23 NOTE — Research (Signed)
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 Identity was confirmed using two patient identifiers. The pt states that she has been doing "good" during the past week.  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  She said that she was tested on Monday, 12/21/20 and she tested negative for the flu and Covid.                QUESTIONNAIRE: Patient reports that she will complete her questionnaires this afternoon, and she will mail them tomorrow.  Site still waiting to receive her week 2 questionnaires in the mail.    INVESTIGATIONAL PRODUCT: Patient reports taking 13 capsules since 12/17/2020 (started taking drug that morning twice daily in AM and PM).  Patient stated she is always compliant with study medication. Patient is reminded on the importance of continuing study medication compliance, verbalizes understanding.   ADVERSE EVENTS: USING CTCAE V. 5.0 Per protocol (Appendix VIII) pt was asked about nausea, vomiting, and diarrhea.  Pt denies nausea and vomiting. Patient Angela Bond reports a history of baseline diarrhea that she treats with Milk of Magnesia as needed.  No change in baseline status per patient report.  Pt specifically states that her occasional, mild diarrhea is not a new symptom.  The pt said that she has only had 1 loose stool this week.  The pt stated that her mild diarrhea has not worsened since starting the study drug.  She thinks that her occasional loose stools in the morning is related to her food from "[employer]".  She said that the food is more "rich" than the way she cooks her food.  Pt does not report any other adverse events or effects. She does  report ongoing neuropathy symptoms.  She states that her neuropathy is "a little better" this week.  She said that she indicated this on her questionnaires that she is completing today.     FOLLOW-UP: Patient Denies concerns at this time.  Patients agrees to next follow-up phone call on 12/30/2020 around 1 pm.     MEDICATIONS: Patient denies any changes in her medications or other treatments (per protocol, Appendix VIII).  Patient denies taking any other cannabinoid products like CBD and/or THC, was re-educated on importance of not taking them while on study.  Pt verbalizes understanding of instructions.      The patient was thanked for their time and continued voluntary participation in this study. Patient has been provided direct contact information and is encouraged to contact this Nurse for any needs or questions.  Brion Aliment RN, BSN, CCRP Clinical Research Nurse Lead 12/23/2020 2:21 PM

## 2020-12-28 ENCOUNTER — Telehealth: Payer: Self-pay | Admitting: *Deleted

## 2020-12-28 ENCOUNTER — Other Ambulatory Visit: Payer: Self-pay | Admitting: *Deleted

## 2020-12-28 MED ORDER — VALACYCLOVIR HCL 1 G PO TABS: 1000.0000 mg | ORAL_TABLET | Freq: Two times a day (BID) | ORAL | 0 refills | Status: DC

## 2020-12-28 NOTE — Telephone Encounter (Signed)
Pt states she thinks she has shingles. Rash has blisters and hurts. Started Friday. Dr Lindi Adie wants her to take Valtrex 1000 mg twice a day for 7 days. Verbalized understanding.

## 2020-12-29 ENCOUNTER — Encounter: Payer: Self-pay | Admitting: *Deleted

## 2020-12-29 ENCOUNTER — Inpatient Hospital Stay: Payer: Medicare Other | Attending: Hematology and Oncology | Admitting: Hematology and Oncology

## 2020-12-29 ENCOUNTER — Other Ambulatory Visit: Payer: Self-pay

## 2020-12-29 DIAGNOSIS — C50411 Malignant neoplasm of upper-outer quadrant of right female breast: Secondary | ICD-10-CM

## 2020-12-29 DIAGNOSIS — Z171 Estrogen receptor negative status [ER-]: Secondary | ICD-10-CM | POA: Diagnosis not present

## 2020-12-29 DIAGNOSIS — Z853 Personal history of malignant neoplasm of breast: Secondary | ICD-10-CM | POA: Diagnosis not present

## 2020-12-29 MED ORDER — NYSTATIN 100000 UNIT/GM EX CREA: 1.0000 "application " | TOPICAL_CREAM | Freq: Two times a day (BID) | CUTANEOUS | 4 refills | Status: DC

## 2020-12-29 NOTE — Assessment & Plan Note (Signed)
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 Cutaneous lesions on her arms and lower extremities: These look like fixed drug eruptions but it is not clear from what medicine.  She has had it even before we started her on the clinical trial for neuropathy.  Peripheral neuropathy: Reports improvement in the neuropathy symptoms and she rates it as 4 out of 10.

## 2020-12-29 NOTE — Progress Notes (Signed)
Patient Care Team: Gaynelle Arabian, MD as PCP - General (Family Medicine) Jerline Pain, MD as PCP - Cardiology (Cardiology) Wonda Horner, MD as Consulting Physician (Gastroenterology) Ralene Ok, MD as Consulting Physician (Surgery) Nicholas Lose, MD as Consulting Physician (Hematology and Oncology) Sylvan Cheese, NP as Nurse Practitioner (Hematology and Oncology) Jovita Kussmaul, MD as Consulting Physician (General Surgery) Thea Silversmith, MD as Consulting Physician (Radiation Oncology)  DIAGNOSIS:  Encounter Diagnosis  Name Primary?   Malignant neoplasm of upper-outer quadrant of right breast in female, estrogen receptor negative ([place])     SUMMARY OF ONCOLOGIC HISTORY: Oncology History  Breast cancer of upper-outer quadrant of right female breast ([place])  07/30/2014 Mammogram   Right breast: Mass at 10 o'clock location 4 cm from the nipple measuring 1.7 x 1.3 x 1.3 cm. This corresponds to the palpable and mammographic finding. In the right axilla, there are several lymph nodes which are normal in size measuring up to 20m.   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%), PR- (0%), HER2/neu negative, Ki67 30%   08/27/2014 Clinical Stage   Stage IIA: T2 N0   09/22/2014 Definitive Surgery   Right lumpectomy/SLNB (Marlou Starks: Invasive ductal carcinoma, grade 3, 2.6 cm, high grade DCIS, LVI present, ER-, PR-, 2 LN removed and 1 positive for metastatic carcinoma (1/2)   09/22/2014 Pathologic Stage   Stage IIB: T2 N1 M0   10/15/2014 - 12/16/2014 Chemotherapy   Adjuvant chemotherapy with Taxotere Cytoxan 4 q3 weeks   10/21/2014 - 10/24/2014 Hospital Admission   Admission for neutropenic fever UTI Klebsiella   01/26/2015 - 03/11/2015 Radiation Therapy   Adjuvant RT (Pablo Ledger: Right breast / 45 Gray @ 1.8 GPearline Cablesper fraction x 25 fractions Right supraclavicular fossa/PAB  45 Gy '@1' .8 Gy per fraction x 25 fractions  Right breast boost / 16 Gray at 2[employer] Corporationper fraction x 8 fractions   05/19/2015 Survivorship   Survivorship care plan visit completed.   03/28/2018 Surgery   Laparoscopic cholecystectomy     CHIEF COMPLIANT: Evaluation of skin issues  INTERVAL HISTORY: BRAMLA HASEis a 75year old above-mentioned history of breast cancer who was started on clinical trial for peripheral neuropathy in July.  She called in complaining that she thinks she had shingles.  It turns out that she does not have shingles and it is more of a fungal infection underneath the left breast in the fold as well as the abdominal fold.  Looks like she was taking and ointment for the infection but it ran out.   ALLERGIES:  is allergic to other, shellfish allergy, aspirin, penicillins, aleve [naproxen], brilinta [ticagrelor], ciprofloxacin, doxycycline, evista [raloxifene], fosamax [alendronate sodium], lactose intolerance (gi), latex, naproxen sodium, nitrofurantoin, pravachol [pravastatin], sulfa antibiotics, tape, welchol [colesevelam hcl], and zocor [simvastatin].  MEDICATIONS:  Current Outpatient Medications  Medication Sig Dispense Refill   acetaminophen (TYLENOL) 500 MG tablet Take 1 tablet (500 mg total) by mouth every 6 (six) hours as needed for moderate pain or headache. 30 tablet 0   ALPRAZolam (XANAX) 0.25 MG tablet Take 0.25 mg by mouth 2 (two) times daily as needed.     aspirin EC 81 MG EC tablet Take 1 tablet (81 mg total) by mouth daily.     calcium carbonate (OSCAL) 1500 (600 Ca) MG TABS tablet Take by mouth.     Calcium Carbonate-Vit D-Min (CALCIUM 1200 PO) Take by mouth.     Cholecalciferol (VITAMIN  D) 2000 UNITS tablet Take 2,000 Units by mouth 2 (two) times daily.      dexlansoprazole (DEXILANT) 60 MG capsule Take 60 mg by mouth daily.     escitalopram (LEXAPRO) 20 MG tablet Take 20 mg by mouth daily.     ezetimibe (ZETIA) 10 MG tablet Take 1 tablet (10 mg total) by mouth at bedtime. 90 tablet 3    famotidine (PEPCID) 20 MG tablet Take 20 mg by mouth at bedtime.      fexofenadine (ALLEGRA) 180 MG tablet Take 180 mg by mouth daily as needed for allergies.      furosemide (LASIX) 20 MG tablet Take one tablet daily as needed for 3 lbs over night weight gain 30 tablet 3   gabapentin (NEURONTIN) 600 MG tablet Take 1 tablet (600 mg total) by mouth 3 (three) times daily. 90 tablet 0   Investigational palmitoylethanolamide/placebo 400 MG capsule [facility]-2102 Take 1 capsule by mouth twice daily for 8 weeks. One capsule (400 mg) in the morning and one capsule (400 mg) in the evening, take with food and swallow whole. Do not crush or open capsule. Store at room temperature. Keep container tightly closed. 120 capsule 0   lovastatin (MEVACOR) 40 MG tablet TAKE 1 TABLET BY MOUTH AT  BEDTIME 90 tablet 2   Magnesium 500 MG TABS Take 500 mg by mouth at bedtime.     meclizine (ANTIVERT) 25 MG tablet Take 25 mg by mouth 3 (three) times daily as needed for dizziness.      montelukast (SINGULAIR) 10 MG tablet Take 10 mg by mouth at bedtime.     nitroGLYCERIN (NITROSTAT) 0.4 MG SL tablet Place 1 tablet (0.4 mg total) under the tongue every 5 (five) minutes as needed for chest pain (CP or SOB). 25 tablet 6   nystatin cream (MYCOSTATIN) Apply 1 application topically 2 (two) times daily. 30 g 4   Polyvinyl Alcohol-Povidone PF (REFRESH) 1.4-0.6 % SOLN Place 2 drops into both eyes 3 (three) times daily as needed (for dry eyes).     potassium chloride (K-DUR) 10 MEQ tablet Take 10 mEq by mouth every 3 (three) days.     triamcinolone cream (KENALOG) 0.1 % Apply 1 application topically 2 (two) times daily. 30 g 4   vitamin C (ASCORBIC ACID) 500 MG tablet Take 500 mg by mouth daily.     Zinc Acetate, Oral, (ZINC ACETATE PO) Take by mouth.     No current facility-administered medications for this visit.    PHYSICAL EXAMINATION: ECOG PERFORMANCE STATUS: 1 - Symptomatic but completely ambulatory  Vitals:   12/29/20  1535  BP: (!) 160/85  Pulse: 75  Resp: 18  Temp: 97.8 F (36.6 C)  SpO2: 98%   Filed Weights   12/29/20 1535  Weight: 163 lb 11.2 oz (74.3 kg)    BREAST: Significant skin erythema below the left breast resembling a fungal infection.  (exam performed in the presence of a chaperone) Abdomen: Similar appearing rash in the fold of her abdomen.        LABORATORY DATA:  I have reviewed the data as listed CMP Latest Ref Rng & Units 11/19/2020 05/20/2020 11/19/2019  Glucose 70 - 99 mg/dL 83 143(H) 123(H)  BUN 8 - 23 mg/dL '10 9 9  ' Creatinine 0.44 - 1.00 mg/dL 0.81 0.83 0.80  Sodium 135 - 145 mmol/L 140 141 140  Potassium 3.5 - 5.1 mmol/L 4.3 3.7 4.0  Chloride 98 - 111 mmol/L 101 104 103  CO2 22 -  32 mmol/L '30 26 28  ' Calcium 8.9 - 10.3 mg/dL 8.8(L) 8.7(L) 8.8(L)  Total Protein 6.5 - 8.1 g/dL 7.4 7.6 7.2  Total Bilirubin 0.3 - 1.2 mg/dL 0.5 0.5 0.4  Alkaline Phos 38 - 126 U/L 81 72 74  AST 15 - 41 U/L 14(L) 12(L) 15  ALT 0 - 44 U/L '8 9 11    ' Lab Results  Component Value Date   WBC 5.9 11/19/2020   HGB 12.1 11/19/2020   HCT 36.1 11/19/2020   MCV 89.4 11/19/2020   PLT 208 11/19/2020   NEUTROABS 3.9 11/19/2020    ASSESSMENT & PLAN:  Breast cancer of upper-outer quadrant of right female breast ([place]) Diagnosed 2016 treated with lumpectomy, Taxotere/Cytoxan as well as radiation therapy, triple negative   Breast cancer surveillance: 1.  Breast exam on 11/19/2020: Benign no palpable lumps or nodules 2.  Mammogram 03/17/2020 n benign breast density category C  3.  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 Cutaneous lesions on her arms and lower extremities:  These look like fixed drug eruptions but it is not clear from what medicine.  She has had it even before we started her on the clinical trial for neuropathy.  Peripheral neuropathy: Reports improvement in the neuropathy symptoms and she rates it as 4 out of 10. Return to clinic in 2 weeks to assess response to treatment.  No orders of the defined types were placed in this encounter.  The patient has a good understanding of the overall plan. she agrees with it. she will call with any problems that may develop before the next visit here. Total time spent: 30 mins including face to face time and time spent for planning, charting and co-ordination of care   Harriette Ohara, MD 12/29/20

## 2020-12-29 NOTE — Research (Signed)
[facility]2102 - TREATMENT OF ESTABLISHED CHEMOTHERAPY-INDUCED NEUROPATHY WITH N-PALMITOYLETHANOLAMIDE, A CANNABIMIMETIC NUTRACEUTICAL: A RANDOMIZED DOUBLE-BLIND PHASE II PILOT TRIAL   WEEK 4- Clinic visit instead of Phone Call Patient was seen in the clinic today to evaluate her skin rash and to complete study week 4 assessments. The pt states that her feet and legs are doing "better".  She called the clinic on Monday, 12/28/20 to complain about a rash under her breast.  The pt believed that it was shingles.   Dr. Lindi Adie decided today that he wanted to evaluate the pt in the clinic to see if the rash was indeed shingles. Therefore, the pt's week 4 phone call scheduled for 12/30/20 was completed in the clinic today.               QUESTIONNAIRE: Patient completed her questionnaires this afternoon, and she returned them to the site today.  Site received her week 2 questionnaires in the mail today.  Site still waiting on pt's week 3 questionnaires.     INVESTIGATIONAL PRODUCT: Patient reports taking 11 capsules since 12/24/2020 (started taking drug that morning twice daily in AM and PM).  Patient stated she is always compliant with study medication. Patient is reminded on the importance of continuing study medication compliance, verbalizes understanding.   ADVERSE EVENTS: USING CTCAE V. 5.0 Per protocol (Appendix VIII) pt was asked about nausea, vomiting, and diarrhea.  Pt denies diarrhea and vomiting.  The pt said that she had mild nausea this morning.  The pt's rash was evaluated by Dr. Lindi Adie.  He did not feel that it was shingles.  He felt that it was definitely a fungal infection. The pt said that she has a history of fungal infections on her abdomen because she has no air condition.  The pt said that she developed the new rash under her breast recently.  Dr. Lindi Adie asked the pt to stop her Valtrex and to start on nystatin cream for her fungal infections. He felt that the pt needed to be seen in 2 weeks  for continued follow up.    FOLLOW-UP: Patient Denies concerns at this time.  Patients agrees to next follow-up phone call on 01/06/2021 around 1 pm.     MEDICATIONS: Patient denies any changes in her medications or other treatments (per protocol, Appendix VIII).  The pt did report starting Valtrex yesterday and taking 2 doses.  Patient denies taking any other cannabinoid products like CBD and/or THC, was re-educated on importance of not taking them while on study.  Pt verbalizes understanding of instructions.      Dr. Lindi Adie read the protocol section (8.1) on dose modifications.  He said that he felt comfortable with the pt staying on her study drug at this time.  The pt knows to call the nurse or Dr. Lindi Adie for any worsening symptoms.  The patient was thanked for their time and continued voluntary participation in this study. Patient has been provided direct contact information and is encouraged to contact this Nurse for any needs or questions.  Brion Aliment RN, BSN, [facility] Clinical Research Nurse Lead 12/29/2020 5:33 PM    [name] [phone]  12/29/2020  Adverse Event Log  Study/Protocol: [facility]-2102 Cycle: Week 4 - (Research nurse met with Dr.Gudena on 12/30/20 to obtain the attributions to the pt's 2 reported AE's.  He felt both AE's were unrelated to her study drug, PEA/placebo.   Event Grade Onset Date Resolved Date Drug Name Attribution Treatment Comments  nausea Grade 1  12/29/20 ongoing PEA or placebo  Unrelated None  Mild nausea this morning  Fungal infection  Grade 2 12/28/20 ongoing PEA or placebo  Unrelated Nystatin cream Pt to be seen in 2 weeks  Brion Aliment RN, BSN, [place] Nurse Lead 12/30/2020 11:42 AM

## 2020-12-30 ENCOUNTER — Encounter: Payer: Self-pay | Admitting: Hematology and Oncology

## 2021-01-05 ENCOUNTER — Encounter (HOSPITAL_COMMUNITY): Payer: Self-pay | Admitting: Emergency Medicine

## 2021-01-05 ENCOUNTER — Emergency Department (HOSPITAL_COMMUNITY)
Admission: EM | Admit: 2021-01-05 | Discharge: 2021-01-05 | Disposition: A | Discharge status: Home / Self Care | Payer: Medicare Other | Attending: Emergency Medicine | Admitting: Emergency Medicine

## 2021-01-05 ENCOUNTER — Other Ambulatory Visit: Payer: Self-pay

## 2021-01-05 ENCOUNTER — Emergency Department (HOSPITAL_COMMUNITY): Payer: Medicare Other

## 2021-01-05 DIAGNOSIS — I10 Essential (primary) hypertension: Secondary | ICD-10-CM | POA: Insufficient documentation

## 2021-01-05 DIAGNOSIS — R42 Dizziness and giddiness: Secondary | ICD-10-CM | POA: Insufficient documentation

## 2021-01-05 DIAGNOSIS — Z743 Need for continuous supervision: Secondary | ICD-10-CM | POA: Diagnosis not present

## 2021-01-05 DIAGNOSIS — R9389 Abnormal findings on diagnostic imaging of other specified body structures: Secondary | ICD-10-CM

## 2021-01-05 DIAGNOSIS — Z7982 Long term (current) use of aspirin: Secondary | ICD-10-CM | POA: Insufficient documentation

## 2021-01-05 DIAGNOSIS — Z91040 Latex allergy status: Secondary | ICD-10-CM | POA: Insufficient documentation

## 2021-01-05 DIAGNOSIS — R079 Chest pain, unspecified: Secondary | ICD-10-CM

## 2021-01-05 DIAGNOSIS — Z96653 Presence of artificial knee joint, bilateral: Secondary | ICD-10-CM | POA: Insufficient documentation

## 2021-01-05 DIAGNOSIS — Z853 Personal history of malignant neoplasm of breast: Secondary | ICD-10-CM | POA: Insufficient documentation

## 2021-01-05 DIAGNOSIS — E876 Hypokalemia: Secondary | ICD-10-CM | POA: Insufficient documentation

## 2021-01-05 DIAGNOSIS — R072 Precordial pain: Secondary | ICD-10-CM | POA: Diagnosis not present

## 2021-01-05 DIAGNOSIS — Z79899 Other long term (current) drug therapy: Secondary | ICD-10-CM | POA: Insufficient documentation

## 2021-01-05 DIAGNOSIS — R6889 Other general symptoms and signs: Secondary | ICD-10-CM | POA: Diagnosis not present

## 2021-01-05 DIAGNOSIS — R61 Generalized hyperhidrosis: Secondary | ICD-10-CM | POA: Diagnosis not present

## 2021-01-05 DIAGNOSIS — R0789 Other chest pain: Secondary | ICD-10-CM | POA: Diagnosis not present

## 2021-01-05 DIAGNOSIS — I2510 Atherosclerotic heart disease of native coronary artery without angina pectoris: Secondary | ICD-10-CM | POA: Insufficient documentation

## 2021-01-05 LAB — BASIC METABOLIC PANEL
Anion gap: 11 (ref 5–15)
BUN: 6 mg/dL — ABNORMAL LOW (ref 8–23)
CO2: 22 mmol/L (ref 22–32)
Calcium: 7.9 mg/dL — ABNORMAL LOW (ref 8.9–10.3)
Chloride: 106 mmol/L (ref 98–111)
Creatinine, Ser: 0.61 mg/dL (ref 0.44–1.00)
GFR, Estimated: >60 mL/min (ref >60)
Glucose, Bld: 89 mg/dL (ref 70–99)
Potassium: 3.3 mmol/L — ABNORMAL LOW (ref 3.5–5.1)
Sodium: 139 mmol/L (ref 135–145)

## 2021-01-05 LAB — CBC
HCT: 43.3 % (ref 36.0–46.0)
Hemoglobin: 13.1 g/dL (ref 12.0–15.0)
MCH: 30 pg (ref 26.0–34.0)
MCHC: 30.3 g/dL (ref 30.0–36.0)
MCV: 99.3 fL (ref 80.0–100.0)
Platelets: 184 10*3/uL (ref 150–400)
RBC: 4.36 MIL/uL (ref 3.87–5.11)
RDW: 13.8 % (ref 11.5–15.5)
WBC: 8.9 10*3/uL (ref 4.0–10.5)
nRBC: 0 % (ref 0.0–0.2)

## 2021-01-05 LAB — HEPATIC FUNCTION PANEL
ALT: 12 U/L (ref 0–44)
AST: 19 U/L (ref 15–41)
Albumin: 3.3 g/dL — ABNORMAL LOW (ref 3.5–5.0)
Alkaline Phosphatase: 70 U/L (ref 38–126)
Bilirubin, Direct: 0.2 mg/dL (ref 0.0–0.2)
Indirect Bilirubin: 0.7 mg/dL (ref 0.3–0.9)
Total Bilirubin: 0.9 mg/dL (ref 0.3–1.2)
Total Protein: 7.8 g/dL (ref 6.5–8.1)

## 2021-01-05 LAB — TROPONIN I (HIGH SENSITIVITY)
Troponin I (High Sensitivity): 5 ng/L (ref <18)
Troponin I (High Sensitivity): 6 ng/L

## 2021-01-05 MED ORDER — CALCIUM CARBONATE ANTACID 500 MG PO CHEW
800.0000 mg | CHEWABLE_TABLET | Freq: Once | ORAL | Status: AC
  Administered 2021-01-05: 800 mg via ORAL
  Filled 2021-01-05: qty 4

## 2021-01-05 MED ORDER — POTASSIUM CHLORIDE CRYS ER 20 MEQ PO TBCR
20.0000 meq | EXTENDED_RELEASE_TABLET | Freq: Once | ORAL | Status: AC
  Administered 2021-01-05: 20 meq via ORAL
  Filled 2021-01-05: qty 1

## 2021-01-05 NOTE — ED Notes (Signed)
Got patient a [place] sandwich patient is now sitting up eating

## 2021-01-05 NOTE — ED Triage Notes (Signed)
Pt bib [facility] from home with complaints of  chest pressure that woke her up 30 min ago. Pt has rash on left arm states it is from her medications. Pt also complains of dizziness and denies n/v. EMS vitals: 188/89,76 HR, 96% RA

## 2021-01-05 NOTE — Discharge Instructions (Addendum)
You were seen in the emergency department for some chest pain.  You had blood work EKG and a chest x-ray.  There was no evidence of heart attack.  Your potassium and calcium are low and you were given some supplement.  Please contact your cardiologist and your oncologist for close follow-up.  Return to the emergency department if any worsening or concerning symptoms.  You also had some signs of bedbugs on your clothing.  Will be important for you to wash your clothes and bed linens in hot water.

## 2021-01-05 NOTE — ED Provider Notes (Signed)
[facility] EMERGENCY DEPARTMENT Provider Note   CSN: [id number] Arrival date & time: 01/05/21  [id number]     History No chief complaint on file.   CLYDIA DURFEY is a 75 y.o. female.  She is here with a complaint of chest tightness going into her left shoulder, feeling dizzy/shaky, and elevated blood pressure that started around 6 AM this morning.  She said this feels similar to when she had a heart attack although at that time she was also diaphoretic.  No nausea or vomiting.  No headache or weakness.  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  She took 4 baby aspirin.  She still has a little bit of tightness.  Follows with Dr. Marlou Porch from cardiology and she is also on a study drug for her neuropathy by Dr. Lindi Adie.  History of breast cancer.  The history is provided by the patient.  Chest Pain Pain location:  Substernal area Pain quality: tightness   Pain radiates to:  L shoulder Pain severity:  Moderate Onset quality:  Sudden Duration:  60 minutes Timing:  Constant Progression:  Improving Chronicity:  Recurrent Relieved by:  Nothing Worsened by:  Nothing Ineffective treatments:  Aspirin Associated symptoms: dizziness   Associated symptoms: no abdominal pain, no cough, no diaphoresis, no fever, no nausea, no shortness of breath and no vomiting   Risk factors: coronary artery disease, high cholesterol and hypertension       Past Medical History:  Diagnosis Date   Anemia    hx   Anxiety and depression    Aortic atherosclerosis ([place]) 07/31/2017   Arthritis    "~ all over my body; in the joints"   Breast cancer, right ([place]) 3/16   Coronary artery disease    a.  s/p NSTEMI in June 2017 with DES to mid LAD.   Depression    Gallstones    GERD (gastroesophageal reflux disease)    H/O hiatal hernia    Headache    Heart murmur    History of blood transfusion    "when taking chemo & w/one of my knee replacements" (11/05/2015)    Hypercholesteremia    Hypertension    Iron deficiency anemia 02/16/2017   Multiple bruises    Myocardial infarction (HCC)    Orthostatic hypotension    Personal history of chemotherapy    Personal history of radiation therapy    Pneumonia X 1   Shingles 5/15   hx   Sliding hiatal hernia s/p lap PEH repair 07/31/2012   Unstable angina ([place]) 01/23/2012   Unsteady gait    " I fall easily"   Wears dentures    partial   Wears glasses     Patient Active Problem List   Diagnosis Date Noted   Fall at home 10/18/2019   Nasal and sinus discharge 10/18/2019   Anxiety and depression    Coronary artery disease    Closed fracture of right distal humerus 12/25/2018   Syncope 12/12/2018   Facial fracture due to fall, closed, initial encounter ([place]) 12/12/2018   Bedbug bite 12/12/2018   Asymptomatic bacteriuria 08/17/2018   Hypomagnesemia 08/15/2018   Fever 08/15/2018   Gallstones 03/28/2018   Obesity (BMI 30-39.9) 07/31/2017   Aortic atherosclerosis ([place]) 07/31/2017   Iron deficiency anemia 02/16/2017   CAD (coronary artery disease), native coronary artery 01/16/2016   Carpal tunnel syndrome of right wrist 12/22/2015   Elbow pain, right 11/24/2015   Orthostatic hypotension 11/19/2015   Edema  of hand 06/15/2015   Pain in thumb joint with movement of right hand 06/15/2015   Hypokalemia 10/21/2014   Breast cancer of upper-outer quadrant of right female breast ([place]) 09/09/2014   Lumbar spinal stenosis 07/31/2014   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/19/2014   Sensation of foreign body in eye 06/19/2014   GERD (gastroesophageal reflux disease)    Depression    Hypercholesteremia    Arthritis    Paroxysmal atrial fibrillation ([place]) 08/12/2012   Hypertension    Sliding hiatal hernia s/p lap PEH repair 07/31/2012   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   Conjunctivochalasis 02/07/2012   Tearing 02/07/2012   Unstable angina ([place]) 01/23/2012   Anxiety 04/28/2011    Stenosis of left lacrimal passage 04/28/2011    Past Surgical History:  Procedure Laterality Date   BREAST BIOPSY Right    BREAST LUMPECTOMY Right 08/2014   BREAST LUMPECTOMY WITH SENTINEL LYMPH NODE BIOPSY Right 09/22/2014   Procedure: RIGHT BREAST LUMPECTOMY WITH NEEDLE LOCALIZATION AND RIGHT AXILLARY SENTINEL LYMPH NODE BIOPSY;  Surgeon: [name], MD;  Location: [place];  Service: General;  Laterality: Right;   CARDIAC CATHETERIZATION     CARDIAC CATHETERIZATION N/A 11/05/2015   Procedure: Left Heart Cath and Coronary Angiography;  Surgeon: Belva Crome, MD;  Location: [place] [facility];  Service: Cardiovascular;  Laterality: N/A;   CARDIAC CATHETERIZATION N/A 11/05/2015   Procedure: Coronary Stent Intervention;  Surgeon: Belva Crome, MD;  Location: [place] [facility];  Service: Cardiovascular;  Laterality: N/A;   CARPAL TUNNEL RELEASE Right 09/2015   CHOLECYSTECTOMY N/A 03/28/2018   Procedure: LAPAROSCOPIC CHOLECYSTECTOMY WITH INTRAOPERATIVE CHOLANGIOGRAM ERAS PATHWAY;  Surgeon: Jovita Kussmaul, MD;  Location: [place];  Service: General;  Laterality: N/A;   CORONARY ANGIOPLASTY WITH STENT PLACEMENT  11/05/2015   "1 stent"   ESOPHAGEAL MANOMETRY N/A 07/02/2012   Procedure: ESOPHAGEAL MANOMETRY (EM);  Surgeon: Lear Ng, MD;  Location: [facility];  Service: Endoscopy;  Laterality: N/A;  schooler to read   EYE SURGERY Left 2013   tube placed tear duct   HIATAL HERNIA REPAIR N/A 08/10/2012   Procedure: LAPAROSCOPIC REPAIR OF HIATAL HERNIA WITH MESH AND EGD WITH PEG TUBE PLACEMENT;  Surgeon: Ralene Ok, MD;  Location: [facility];  Service: General;  Laterality: N/A;   LEFT HEART CATH AND CORONARY ANGIOGRAPHY N/A 08/01/2017   Procedure: LEFT HEART CATH AND CORONARY ANGIOGRAPHY;  Surgeon: Jettie Booze, MD;  Location: [place] [facility];  Service: Cardiovascular;  Laterality: N/A;   ORIF ELBOW FRACTURE Right 12/25/2018   ORIF HUMERUS FRACTURE Right 12/25/2018   Procedure:  OPEN REDUCTION INTERNAL FIXATION (ORIF) DISTAL HUMERUS FRACTURE;  Surgeon: Altamese Sheldon, MD;  Location: [place];  Service: Orthopedics;  Laterality: Right;   PORT-A-CATH REMOVAL  06/2015   PORTACATH PLACEMENT Left 09/22/2014   Procedure: INSERTION PORT-A-CATH;  Surgeon: [name], MD;  Location: [place];  Service: General;  Laterality: Left;   POSTERIOR LUMBAR FUSION  2016   "had 2 vertebrae fused"   TONSILLECTOMY  as child   TOTAL KNEE ARTHROPLASTY Bilateral 2011,2012     OB History     Gravida  1   Para  1   Term      Preterm      AB      Living  1      SAB      IAB      Ectopic  Multiple      Live Births              Family History  Problem Relation Age of Onset   Heart disease Mother        3s, CHF   Heart disease Father        d/o MI at 28   Cancer Father        skin   Diabetes Father    Cancer Sister 11       breast   Diabetes Sister    Heart disease Sister        CABG in mid-60s    Social History   Tobacco Use   Smoking status: Never   Smokeless tobacco: Never  Vaping Use   Vaping Use: Never used  Substance Use Topics   Alcohol use: No   Drug use: No    Home Medications Prior to Admission medications   Medication Sig Start Date End Date Taking? Authorizing Provider  acetaminophen (TYLENOL) 500 MG tablet Take 1 tablet (500 mg total) by mouth every 6 (six) hours as needed for moderate pain or headache. 12/26/18   Ainsley Spinner, PA-C  ALPRAZolam Duanne Moron) 0.25 MG tablet Take 0.25 mg by mouth 2 (two) times daily as needed. 10/15/19   [provider]  aspirin EC 81 MG EC tablet Take 1 tablet (81 mg total) by mouth daily. 11/06/15   Lyda Jester M, PA-C  calcium carbonate (OSCAL) 1500 (600 Ca) MG TABS tablet Take by mouth.    [provider]  Calcium Carbonate-Vit D-Min (CALCIUM 1200 PO) Take by mouth.    [provider]  Cholecalciferol (VITAMIN D) 2000 UNITS tablet Take 2,000 Units by mouth 2 (two) times  daily.     [provider]  dexlansoprazole (DEXILANT) 60 MG capsule Take 60 mg by mouth daily.    [provider]  escitalopram (LEXAPRO) 20 MG tablet Take 20 mg by mouth daily. 01/22/16   [provider]  ezetimibe (ZETIA) 10 MG tablet Take 1 tablet (10 mg total) by mouth at bedtime. 09/07/18   Jerline Pain, MD  famotidine (PEPCID) 20 MG tablet Take 20 mg by mouth at bedtime.  08/31/18   [provider]  fexofenadine (ALLEGRA) 180 MG tablet Take 180 mg by mouth daily as needed for allergies.     [provider]  furosemide (LASIX) 20 MG tablet Take one tablet daily as needed for 3 lbs over night weight gain 09/07/18   Jerline Pain, MD  gabapentin (NEURONTIN) 600 MG tablet Take 1 tablet (600 mg total) by mouth 3 (three) times daily. 10/18/18   Hyatt, Max T, DPM  Investigational palmitoylethanolamide/placebo 400 MG capsule ACCRU-[place]-2102 Take 1 capsule by mouth twice daily for 8 weeks. One capsule (400 mg) in the morning and one capsule (400 mg) in the evening, take with food and swallow whole. Do not crush or open capsule. Store at room temperature. Keep container tightly closed. 12/03/20   Nicholas Lose, MD  lovastatin (MEVACOR) 40 MG tablet TAKE 1 TABLET BY MOUTH AT  BEDTIME 06/29/18   Jerline Pain, MD  Magnesium 500 MG TABS Take 500 mg by mouth at bedtime.    [provider]  meclizine (ANTIVERT) 25 MG tablet Take 25 mg by mouth 3 (three) times daily as needed for dizziness.     [provider]  montelukast (SINGULAIR) 10 MG tablet Take 10 mg by mouth at bedtime. 08/23/18   [provider]  nitroGLYCERIN (NITROSTAT) 0.4 MG SL tablet Place 1 tablet (0.4 mg total) under the tongue every 5 (five) minutes as needed for chest pain (CP or SOB). 09/07/18   Jerline Pain, MD  nystatin cream (MYCOSTATIN) Apply 1 application topically 2 (two) times daily. 12/29/20   Nicholas Lose, MD  Polyvinyl Alcohol-Povidone PF (REFRESH) 1.4-0.6 % SOLN  Place 2 drops into both eyes 3 (three) times daily as needed (for dry eyes).    [provider]  potassium chloride (K-DUR) 10 MEQ tablet Take 10 mEq by mouth every 3 (three) days.    [provider]  triamcinolone cream (KENALOG) 0.1 % Apply 1 application topically 2 (two) times daily. 05/08/19   Fontaine, Belinda Block, MD  vitamin C (ASCORBIC ACID) 500 MG tablet Take 500 mg by mouth daily.    [provider]  Zinc Acetate, Oral, (ZINC ACETATE PO) Take by mouth.    [provider]    Allergies    Other, Shellfish allergy, Aspirin, Penicillins, Aleve [naproxen], Brilinta [ticagrelor], Ciprofloxacin, Doxycycline, Evista [raloxifene], Fosamax [alendronate sodium], Lactose intolerance (gi), Latex, Naproxen sodium, Nitrofurantoin, Pravachol [pravastatin], Sulfa antibiotics, Tape, Welchol [colesevelam hcl], and Zocor [simvastatin]  Review of Systems   Review of Systems  Constitutional:  Negative for diaphoresis and fever.  HENT:  Negative for sore throat.   Eyes:  Negative for visual disturbance.  Respiratory:  Negative for cough and shortness of breath.   Cardiovascular:  Positive for chest pain.  Gastrointestinal:  Negative for abdominal pain, nausea and vomiting.  Genitourinary:  Negative for dysuria.  Musculoskeletal:  Negative for neck pain.  Skin:  Positive for rash.  Neurological:  Positive for dizziness.   Physical Exam Updated Vital Signs BP (!) 151/71   Pulse 63   Temp 98.1 F (36.7 C) (Oral)   Resp 16   Ht '4\' 8"'$  (1.422 m)   Wt 73.9 kg   SpO2 99%   BMI 36.54 kg/m   Physical Exam Vitals and nursing note reviewed.  Constitutional:      General: She is not in acute distress.    Appearance: Normal appearance. She is well-developed.  HENT:     Head: Normocephalic and atraumatic.  Eyes:     Conjunctiva/sclera: Conjunctivae normal.  Cardiovascular:     Rate and Rhythm: Normal rate and regular rhythm.     Heart sounds: No murmur  heard. Pulmonary:     Effort: Pulmonary effort is normal. No respiratory distress.     Breath sounds: Normal breath sounds.  Abdominal:     Palpations: Abdomen is soft.     Tenderness: There is no abdominal tenderness.  Musculoskeletal:        General: No deformity or signs of injury. Normal range of motion.     Cervical back: Neck supple.  Skin:    General: Skin is warm and dry.     Comments: Bedbugs crawling over her clothing  Neurological:     General: No focal deficit present.     Mental Status: She is alert. Mental status is at baseline.    ED Results / Procedures / Treatments   Labs (all labs ordered are listed, but only abnormal results are displayed) Labs Reviewed  BASIC METABOLIC PANEL - Abnormal; Notable for the following components:      Result Value   Potassium 3.3 (*)    BUN 6 (*)    Calcium 7.9 (*)    All other components within normal limits  HEPATIC FUNCTION PANEL -  Abnormal; Notable for the following components:   Albumin 3.3 (*)    All other components within normal limits  CBC  TROPONIN I (HIGH SENSITIVITY)  TROPONIN I (HIGH SENSITIVITY)    EKG EKG Interpretation  Date/Time:  Tuesday January 05 2021 06:44:33 EDT Ventricular Rate:  64 PR Interval:  155 QRS Duration: 78 QT Interval:  419 QTC Calculation: 433 R Axis:   79 Text Interpretation: Sinus rhythm No acute changes No significant change since prior 7/20 Confirmed by Aletta Edouard [id number]) on 01/05/2021 6:49:47 AM  Radiology DG Chest Port 1 View  Result Date: 01/05/2021 CLINICAL DATA:  75 year old female with history of chest pain. Hypertension. EXAM: PORTABLE CHEST 1 VIEW COMPARISON:  Chest x-ray 08/15/2018. FINDINGS: Lung volumes are normal. No consolidative airspace disease. No pleural effusions. No pneumothorax. Mass-like prominence in the perihilar region of the right lung. Pulmonary vasculature and the cardiomediastinal silhouette are otherwise within normal limits. Atherosclerosis in the  thoracic aorta. IMPRESSION: 1. No definite radiographic evidence of acute cardiopulmonary disease. 2. Mass-like prominence in the perihilar aspect of the right lung. This could simply be vascular structures, however, the possibility of underlying neoplasm warrants consideration. Further evaluation with contrast enhanced chest CT is recommended in the near future to better evaluate this finding. 3. Aortic atherosclerosis. Electronically Signed   By: Vinnie Langton M.D.   On: 01/05/2021 07:58    Procedures Procedures   Medications Ordered in ED Medications - No data to display  ED Course  I have reviewed the triage vital signs and the nursing notes.  Pertinent labs & imaging results that were available during my care of the patient were reviewed by me and considered in my medical decision making (see chart for details).  Clinical Course as of 01/05/21 1831  Tue Jan 05, 2021  0812 Chest x-ray showing right upper lobe mass. [MB]  [phone] Patient had bedbugs on her and her close were removed by nursing and she was given a fresh set of scrubs. [MB]  [id number] Patient's calcium is low and she is on calcium supplementation on her med list.  Have ordered her some p.o. calcium. [MB]  1156 Reviewed results of testing with patient.  She is comfortable plan for discharge.  Recommended follow-up with her treating providers.  Return instructions discussed [MB]    Clinical Course User Index [MB] Hayden Rasmussen, MD   MDM Rules/Calculators/A&P                          This patient complains of left-sided chest pain; this involves an extensive number of treatment Options and is a complaint that carries with it a high risk of complications and Morbidity. The differential includes ACS, pneumonia, PE, pneumothorax, vascular, reflux, musculoskeletal  I ordered, reviewed and interpreted labs, which included CBC with normal white count normal hemoglobin, chemistries normal other than mildly low potassium and low  calcium.  These were orally repleted.  LFTs with low albumin which may help normalize patient's calcium somewhat.  Troponins flat I ordered medication oral calcium and potassium I ordered imaging studies which included chest x-ray and I independently    visualized and interpreted imaging which showed mass in right lung which could be vascular but will need additional imaging as outpatient  Previous records obtained and reviewed in epic no recent admissions  After the interventions stated above, I reevaluated the patient and found patient pain to be improved and hemodynamically stable.  Satting 99% on room  air.  Reviewed results of work-up with her including the findings on the chest x-ray that we will need outpatient follow-up with her primary care doctor for further testing.  Return instructions discussed   Final Clinical Impression(s) / ED Diagnoses Final diagnoses:  Nonspecific chest pain  Hypokalemia  Hypocalcemia  Abnormal finding on radiology exam    Rx / DC Orders ED Discharge Orders     None        Hayden Rasmussen, MD 01/05/21 1840

## 2021-01-05 NOTE — ED Notes (Signed)
Walked patient to the bathroom patient did well 

## 2021-01-06 ENCOUNTER — Encounter: Payer: Self-pay | Admitting: *Deleted

## 2021-01-06 DIAGNOSIS — C50411 Malignant neoplasm of upper-outer quadrant of right female breast: Secondary | ICD-10-CM

## 2021-01-06 NOTE — Research (Signed)
ACCRU-[facility]-2102 - TREATMENT OF ESTABLISHED CHEMOTHERAPY-INDUCED NEUROPATHY WITH N-PALMITOYLETHANOLAMIDE, A CANNABIMIMETIC NUTRACEUTICAL: A RANDOMIZED DOUBLE-BLIND PHASE II PILOT TRIAL   WEEK 5- Phone Call Patient was called by her research nurse this morning for her week 5 phone call.  Of note, the pt was seen and discharged from the emergency room yesterday.  The pt was seen for non-cardiac chest pain and dizziness.  The pt said that she is feeling better today.  She said that her potassium and calcium were low.  The said that her rash is still ongoing with little to no improvement.  She states she has less numbness and leg swelling.                QUESTIONNAIRE: Patient said she completed her questionnaires yesterday, and she will mail them today. Site received pt's week 3 questionnaires on 01/05/21.     INVESTIGATIONAL PRODUCT: Patient reports taking 11 capsules since 12/31/2020 (started taking drug that morning twice daily in AM and PM).  Patient said that she has missed 2 doses of her study drug.  She missed her "am doses on Monday and Tuesday" per patient report.  She said that she took her am dose this morning with food, and she plans to take her PM dose this evening with food. Patient is reminded on the importance of continuing study medication compliance, verbalizes understanding.   ADVERSE EVENTS: USING CTCAE V. 5.0 Per protocol (Appendix VIII) pt was asked about nausea, vomiting, and diarrhea.  Pt denies nausea and vomiting.  The pt said that she had mild diarrhea this morning.  She said that having a "loose stool" is not unusual for her.  The pt reported mild diarrhea occasionally at baseline. The pt's rash is still present.  The pt said that she is using her cream prescribed by Dr. Lindi Adie.  She is scheduled to see Dr. Lindi Adie on 01/12/21 for follow up of her rash.  The research nurse informed Dr. Lindi Adie about the pt's ED visit on 01/05/21.     FOLLOW-UP: Patient denies concerns at this time.   Patients agrees to next follow-up on 01/12/2021 during her clinic visit.  The pt said that she will bring in her completed questionnaires to this visit.       MEDICATIONS: Patient denies any changes in her medications or other treatments (per protocol, Appendix VIII).  The pt said that she took a potassium pill and a calcium pill in the ED.  Patient denies taking any other cannabinoid products like CBD and/or THC, was re-educated on importance of not taking them while on study.  Pt verbalizes understanding of instructions.      The patient was thanked for their time and continued voluntary participation in this study. Patient has been provided direct contact information and is encouraged to contact this Nurse for any needs or questions.    Chakira Jachim Orvan Seen [phone]   Adverse Event Log   Study/Protocol: ACCRU-[place]-2102 Cycle: Week 5 - (Research nurse met with Dr.Gudena on 01/07/21 to obtain attributions).     Event Grade Onset Date Resolved Date Drug Name Attribution Treatment Comments  nausea Grade 1 12/29/20 01/05/21 PEA or placebo  Unrelated None  Mild nausea this morning  Fungal infection  Grade 2 12/28/20 ongoing PEA or placebo  Unrelated Nystatin cream Pt to be seen in 2 weeks  Non-cardiac chest pain Grade 2  01/05/21 01/05/21 PEA or placebo  Unrelated ED visit   Dizziness  Grade 1  01/05/21 01/05/21 PEA or placebo  Unrelated ED visit   Hypokalemia  Grade 2 01/05/21 ongoing PEA or placebo  Unrelated Potassium pill 20 mEq Given to pt in the ED  Hypocalcemia Grade 1 01/05/21 ongoing PEA or placebo  Unrelated Calcium carbonate Given to pt in the ED   [place], BSN, [place] Nurse Lead 01/07/2021 2:11 PM

## 2021-01-07 ENCOUNTER — Encounter: Payer: Self-pay | Admitting: Hematology and Oncology

## 2021-01-08 ENCOUNTER — Telehealth: Payer: Self-pay | Admitting: *Deleted

## 2021-01-08 NOTE — Telephone Encounter (Signed)
Called Angela Bond to discuss fungal infection. States she is still dealing with it. Information obtained by Research nurse for a product that might help. Information printed and will be given to her at appt on Tuesday.

## 2021-01-11 NOTE — Assessment & Plan Note (Deleted)
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  Peripheral neuropathy: Reports improvement in the neuropathy symptoms and she rates it as 4 out of 10.

## 2021-01-12 ENCOUNTER — Inpatient Hospital Stay: Payer: Medicare Other | Admitting: Hematology and Oncology

## 2021-01-12 ENCOUNTER — Telehealth: Payer: Self-pay | Admitting: Emergency Medicine

## 2021-01-12 ENCOUNTER — Encounter: Payer: Self-pay | Admitting: Emergency Medicine

## 2021-01-12 ENCOUNTER — Telehealth: Payer: Self-pay

## 2021-01-12 DIAGNOSIS — C50411 Malignant neoplasm of upper-outer quadrant of right female breast: Secondary | ICD-10-CM

## 2021-01-12 DIAGNOSIS — Z171 Estrogen receptor negative status [ER-]: Secondary | ICD-10-CM

## 2021-01-12 NOTE — Telephone Encounter (Signed)
RN called patient regarding appointment for 8/30.     Pt reports rash has improved since she last saw MD, but is still present.   She is currently still using cream that was prescribed by MD.   Pt is following up with PCP on Thursday to follow up from recent ED visit.  Pt requesting to cancel today's appointment, and continue with follow up with PCP.  MD notified, in agreement.  RN mailed information to patient for Interdry, a moisture wicking product.  Pt agreeable to call clinic after PCP follow up.  No further needs at this time.

## 2021-01-12 NOTE — Research (Addendum)
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  Patient did not answer, unable to leave voicemail.  01/14/2021: 11:00am: Called again to complete week 6 phone call.  QUESTIONNAIRE: Patient said she completed her questionnaires today and is sending them in the mail today.  Week 5 questionnaires have been received by the site this week.   INVESTIGATIONAL PRODUCT: Patient reports taking the study drug twice daily and has not missed any doses since 01/06/2021.  Patient is reminded on the importance of continuing study medication compliance, verbalizes understanding.   ADVERSE EVENTS: USING CTCAE V. 5.0 Per protocol (Appendix VIII) pt was asked about nausea, vomiting, and diarrhea.  Pt denies nausea, vomiting, or diarrhea in the past week.  She reports her rash is still present but improving.  She is using the cream prescribed by Dr. Lindi Adie twice daily.  The patient cancelled her appointment with Dr. Lindi Adie on 01/12/2021.  She states she will be seeing her PCP today to follow up from her ER visit.  She plans to discuss her rash with her PCP as well.  Patient asked about the name of the moisture wicking product that was recommended to her and she was informed the name was "Interdry".   MEDICATIONS: Patient denies any changes in her medications or other treatments (per protocol, Appendix VIII).  Patient denies taking any other cannabinoid products like CBD and/or THC, was re-educated on importance of not taking them while on study.  Pt verbalizes understanding of instructions.      The patient was thanked for their time and continued voluntary participation in this study. Patient has been provided direct contact information for this research coordinator if she has any questions.     FOLLOW-UP: Patient denies concerns at this time.  She is aware to expect a week 7 phone call next week.  Adverse Event Log   Study/Protocol: [facility]U-[id number] Cycle: Week 6    Event Grade Onset Date Resolved Date Drug Name Attribution Treatment Comments  nausea Grade 1 12/29/20 01/05/21 PEA or placebo  Unrelated None  Mild nausea this morning  Fungal infection  Grade 2 12/28/20 ongoing PEA or placebo  Unrelated Nystatin cream Patient seeing PCP today, 01/14/21  Non-cardiac chest pain Grade 2  01/05/21 01/05/21 PEA or placebo  Unrelated ED visit    Dizziness  Grade 1  01/05/21 01/05/21 PEA or placebo  Unrelated ED visit    Hypokalemia  Grade 2 01/05/21 ongoing PEA or placebo  Unrelated Potassium pill 20 mEq Given to pt in the ED  Hypocalcemia Grade 1 01/05/21 ongoing PEA or placebo  Unrelated Calcium carbonate Given to pt in the ED     Worth Coordinator I  01/14/21 11:12 AM

## 2021-01-14 ENCOUNTER — Encounter: Payer: Self-pay | Admitting: Hematology and Oncology

## 2021-01-14 ENCOUNTER — Other Ambulatory Visit: Payer: Self-pay | Admitting: Family Medicine

## 2021-01-14 DIAGNOSIS — R9389 Abnormal findings on diagnostic imaging of other specified body structures: Secondary | ICD-10-CM

## 2021-01-14 DIAGNOSIS — Z23 Encounter for immunization: Secondary | ICD-10-CM | POA: Diagnosis not present

## 2021-01-14 DIAGNOSIS — I208 Other forms of angina pectoris: Secondary | ICD-10-CM | POA: Diagnosis not present

## 2021-01-14 DIAGNOSIS — Z853 Personal history of malignant neoplasm of breast: Secondary | ICD-10-CM | POA: Diagnosis not present

## 2021-01-14 DIAGNOSIS — G629 Polyneuropathy, unspecified: Secondary | ICD-10-CM | POA: Diagnosis not present

## 2021-01-14 DIAGNOSIS — I25119 Atherosclerotic heart disease of native coronary artery with unspecified angina pectoris: Secondary | ICD-10-CM | POA: Diagnosis not present

## 2021-01-14 DIAGNOSIS — I10 Essential (primary) hypertension: Secondary | ICD-10-CM | POA: Diagnosis not present

## 2021-01-14 DIAGNOSIS — R7309 Other abnormal glucose: Secondary | ICD-10-CM | POA: Diagnosis not present

## 2021-01-14 DIAGNOSIS — I700 Atherosclerosis of aorta: Secondary | ICD-10-CM | POA: Diagnosis not present

## 2021-01-14 DIAGNOSIS — R42 Dizziness and giddiness: Secondary | ICD-10-CM | POA: Diagnosis not present

## 2021-01-14 DIAGNOSIS — Z0000 Encounter for general adult medical examination without abnormal findings: Secondary | ICD-10-CM | POA: Diagnosis not present

## 2021-01-14 DIAGNOSIS — E7800 Pure hypercholesterolemia, unspecified: Secondary | ICD-10-CM | POA: Diagnosis not present

## 2021-01-19 ENCOUNTER — Ambulatory Visit
Admission: RE | Admit: 2021-01-19 | Discharge: 2021-01-19 | Disposition: A | Discharge status: Home / Self Care | Payer: Medicare Other | Source: Ambulatory Visit | Attending: Family Medicine | Admitting: Family Medicine

## 2021-01-19 DIAGNOSIS — J984 Other disorders of lung: Secondary | ICD-10-CM | POA: Diagnosis not present

## 2021-01-19 DIAGNOSIS — I700 Atherosclerosis of aorta: Secondary | ICD-10-CM | POA: Diagnosis not present

## 2021-01-19 DIAGNOSIS — I2510 Atherosclerotic heart disease of native coronary artery without angina pectoris: Secondary | ICD-10-CM | POA: Diagnosis not present

## 2021-01-19 DIAGNOSIS — R918 Other nonspecific abnormal finding of lung field: Secondary | ICD-10-CM | POA: Diagnosis not present

## 2021-01-19 DIAGNOSIS — R9389 Abnormal findings on diagnostic imaging of other specified body structures: Secondary | ICD-10-CM

## 2021-01-19 MED ORDER — IOPAMIDOL (ISOVUE-300) INJECTION 61%
75.0000 mL | Freq: Once | INTRAVENOUS | Status: AC | PRN
  Administered 2021-01-19: 75 mL via INTRAVENOUS

## 2021-01-21 ENCOUNTER — Encounter: Payer: Self-pay | Admitting: *Deleted

## 2021-01-21 DIAGNOSIS — Z171 Estrogen receptor negative status [ER-]: Secondary | ICD-10-CM

## 2021-01-21 NOTE — Research (Signed)
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  Patient did not answer, unable to leave voicemail because pt's mailbox was full.     01/21/2021: 10:00am: Called again to complete week 7 phone call. Pt stated that her volume was down to a "1", and she could not hear her phone yesterday.  The pt said that she realized this morning about her missed calls, and she turned her volume to "15".     QUESTIONNAIRE: Patient said she completed her questionnaires today and is sending them in the mail today. The pt's Week 6 questionnaires arrived in the mail today.     INVESTIGATIONAL PRODUCT: Patient reports taking the study drug twice daily and has not missed any doses in the last week.  Patient is reminded on the importance of continuing study medication compliance, verbalizes understanding.   ADVERSE EVENTS: USING CTCAE V. 5.0 Per protocol (Appendix VIII), pt was asked about nausea, vomiting, and diarrhea.  Pt denies nausea, vomiting, or diarrhea in the past week.  She reports her rash is still present but improving.  She said that her rash is less red this week.  She was seen by her Primary Care Physician last week.  She said that her follow up labs were drawn on 01/14/21, and she was told that her potassium and calcium were normal.  The pt said that she also had a chest CT to follow up on a finding from her ER visit.  The pt was told that her CT scan on 01/20/21 did not reveal any masses.  The pt said that her physician told her to use clotrimazole on her fungus infection.  The pt said that she has not purchased the Interdry dressing yet. The research site has requested Dr. Andrew Au office notes and labs.     MEDICATIONS: Patient denies any changes in her medications or other  treatments (per protocol, Appendix VIII).  Patient denies taking any other cannabinoid products like CBD and/or THC, was re-educated on importance of not taking them while on study.  Pt verbalizes understanding of instructions.      The patient was thanked for her time and continued voluntary participation in this study. Patient has been provided direct contact information for this research nurse if she has any questions.     FOLLOW-UP: Patient denies concerns at this time.  She is aware to expect a week 8 phone call next week.   Adverse Event Log   Study/Protocol: [facility]U-[place]-2102 Cycle: Week 7    Event Grade Onset Date Resolved Date Drug Name Attribution Treatment Comments  Fungal infection  Grade 2 12/28/20 ongoing PEA or placebo  Unrelated Nystatin cream Patient was told to use OTC clotrimazole on rash.   Hypokalemia  Grade 2 01/05/21 01/14/2021 PEA or placebo  Unrelated Potassium pill 20 mEq Pt said most recent labs were normal.    Hypocalcemia Grade 1 01/05/21 01/14/2021 PEA or placebo  Unrelated Calcium carbonate Pt said most recent labs were normal     Brion Aliment RN, BSN, CCRP Clinical Research Nurse Lead 01/21/2021 10:52 AM

## 2021-01-27 ENCOUNTER — Encounter: Payer: Self-pay | Admitting: *Deleted

## 2021-01-27 DIAGNOSIS — C50411 Malignant neoplasm of upper-outer quadrant of right female breast: Secondary | ICD-10-CM

## 2021-01-27 DIAGNOSIS — Z171 Estrogen receptor negative status [ER-]: Secondary | ICD-10-CM

## 2021-01-27 NOTE — Research (Signed)
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  Research nurse confirmed pt's identity with 2 identifiers.      QUESTIONNAIRE: Patient said she completed her week 8 questionnaires today.  The pt said that she forgot to mail her week 7 questionnaires last week.   The pt stated she will return her week 7 and week 8 questionnaires along with her study drug bottle on Friday, 01/29/21.  The pt was told that "non-emergent unblinding can only occur once a patient has completed 8 weeks of study, and has returned all questionnaires and study drug".  The pt is very eager to see what she has been taking for the past 8 weeks.     INVESTIGATIONAL PRODUCT: Patient reports taking the study drug twice daily and has not missed any doses in the last week (01/21/21-01/27/21).  She said that she will take her last study drug pill this evening.  Patient is reminded on the importance of continuing study medication compliance, verbalizes understanding.  The pt was thanked for compliance.    ADVERSE EVENTS: USING CTCAE V. 5.0 Per protocol (Appendix VIII), pt was asked about nausea, vomiting, and diarrhea.  Pt denies nausea, vomiting, or diarrhea in the past week.  She reports her rash is still present but improving. The pt said that she has not purchased the Interdry dressing yet.  The pt said that she gets paid soon, and she will consider using this dressing.  The pt denies any other adverse events.     MEDICATIONS: Patient denies any changes in her medications or other treatments (per protocol, Appendix VIII).  Patient denies taking any other cannabinoid products like CBD and/or THC, was re-educated on importance of not taking them while on study.  Pt verbalizes understanding of instructions.      The patient  was thanked for her time and continued voluntary participation in this study. Patient has been provided direct contact information for this research nurse if she has any questions.     FOLLOW-UP: Patient denies concerns at this time.  The pt has agreed to return her week 7 and week 8 questionnaires along with her study drug bottle on Friday, 01/29/21.  Once the research nurse receives these items, the study will be contacted about her unblinding.     Adverse Event Log   Study/Protocol: [facility]U-[place]-2102 Cycle: Week 8    Event Grade Onset Date Resolved Date Drug Name Attribution Treatment Comments  Fungal infection  Grade 2 12/28/20 ongoing PEA or placebo  Unrelated Nystatin cream Patient was told to use OTC clotrimazole on rash by her PCP.   Brion Aliment RN, BSN, CCRP Clinical Research Nurse Lead 01/27/2021 11:05 AM

## 2021-01-29 ENCOUNTER — Encounter: Payer: Self-pay | Admitting: *Deleted

## 2021-01-29 DIAGNOSIS — C50411 Malignant neoplasm of upper-outer quadrant of right female breast: Secondary | ICD-10-CM

## 2021-01-29 NOTE — Research (Signed)
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  The research nurse entered the pt's questionnaires in [place]. The questionnaires were downloaded as supporting documentation.  The nurse counted the pt's returned study drug.  3 bottles were empty and 1 bottle had 14 remaining pills inside.  The study drug bottles were taken to the pharmacy for the drug accountability check.  The bottles were given to Kennith Center, pharmacist.  Final accountability check revealed 14 returned pills.  Dr. Lindi Adie was informed that this pt has completed her 8 weeks on study.  The pt reported some improvement in her neuropathy symptoms.  Dr. Lindi Adie requested a non-emergent unblinding for this subject.  The research nurse contacted the study by email requesting a non-emergent unblinding.  All of the pt's data has been entered along with the pt's off treatment CRF page.  The pt's 6 month follow up call will be in March 2023.  The pt was thanked for her participation and compliance with her study drug and questionnaires.  The study emailed the nurse later in the day with the unblinding assigment.  The pt's study drug was Placebo 2 x day.  The nurse attempted to reach the pt by phone to inform of her study drug assigment, but she was not available by phone.  Will attempt to reach the pt next week.  Dr. Lindi Adie was informed that the pt was taking Placebo 2 x day. Brion Aliment RN, BSN, CCRP Clinical Research Nurse Lead 01/29/2021 5:06 PM

## 2021-02-01 ENCOUNTER — Telehealth: Payer: Self-pay | Admitting: *Deleted

## 2021-02-01 DIAGNOSIS — H2513 Age-related nuclear cataract, bilateral: Secondary | ICD-10-CM | POA: Diagnosis not present

## 2021-02-01 DIAGNOSIS — H5203 Hypermetropia, bilateral: Secondary | ICD-10-CM | POA: Diagnosis not present

## 2021-02-01 NOTE — Telephone Encounter (Signed)
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  The pt was not available by phone on Friday afternoon, 9/16.  The nurse spoke to the pt this morning by phone and thanked her for completing the 8 week study.  The pt was thanked for returning all of her questionnaires and study drug bottles.  The pt was informed that she was receiving "Placebo 2x day".  The pt said that she should have known because her feet was "still bothering her".  The pt said that she might consider re-starting gabapentin.  The pt was told to consult Dr. Lindi Adie before she restarts any medication for her neuropathy.  The pt verbalized understanding.  The pt said that she did not have any questions or concerns about the study.  The nurse told the pt that if other neuropathy studies come available and if she may qualify then the research nurse would contact her about participation.  The pt agreed that she would be open to any new clinical trials to help with chemo-induced neuropathy. The pt was encouraged to contact Dr. Lindi Adie if she has any further questions/concerns.  Brion Aliment RN, BSN, CCRP Clinical Research Nurse Lead 02/01/2021 9:56 AM

## 2021-02-02 ENCOUNTER — Other Ambulatory Visit: Payer: Medicare Other

## 2021-02-11 ENCOUNTER — Other Ambulatory Visit: Payer: Self-pay | Admitting: *Deleted

## 2021-02-22 ENCOUNTER — Encounter: Payer: Self-pay | Admitting: Hematology and Oncology

## 2021-02-22 NOTE — Telephone Encounter (Signed)
Encounter opened in error

## 2021-03-16 ENCOUNTER — Other Ambulatory Visit: Payer: Self-pay | Admitting: Family Medicine

## 2021-03-16 DIAGNOSIS — Z1231 Encounter for screening mammogram for malignant neoplasm of breast: Secondary | ICD-10-CM

## 2021-03-17 ENCOUNTER — Telehealth: Payer: Self-pay | Admitting: *Deleted

## 2021-03-17 NOTE — Telephone Encounter (Signed)
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  The nurse informed the pt that the study thinks that "it is quite reasonable to have the pt take PEA off study".  The note mentioned that it was "available on [place] with multiple products".  The study encouraged users to buy the "micronized" PEA.  The note stated the cost is "about $30, and the usual dose off study is 400-800 mg daily".  The pt thanked the nurse for calling her with this information.  She said that she is not taking off study PEA.  She also said that she is not planning on ordering off study PEA.  The pt did not have any questions for the research nurse.  The nurse informed the pt to call the research nurse if she has any study questions.  The pt was thanked for her ongoing support of this clinical trial.    Brion Aliment RN, BSN, CCRP Clinical Research Nurse Lead 03/17/2021 12:50 PM

## 2021-04-19 ENCOUNTER — Ambulatory Visit
Admission: RE | Admit: 2021-04-19 | Discharge: 2021-04-19 | Disposition: A | Discharge status: Home / Self Care | Payer: Medicare Other | Source: Ambulatory Visit | Attending: Family Medicine | Admitting: Family Medicine

## 2021-04-19 ENCOUNTER — Other Ambulatory Visit: Payer: Self-pay

## 2021-04-19 DIAGNOSIS — Z1231 Encounter for screening mammogram for malignant neoplasm of breast: Secondary | ICD-10-CM | POA: Diagnosis not present

## 2021-05-24 DIAGNOSIS — K5900 Constipation, unspecified: Secondary | ICD-10-CM | POA: Diagnosis not present

## 2021-05-24 DIAGNOSIS — K219 Gastro-esophageal reflux disease without esophagitis: Secondary | ICD-10-CM | POA: Diagnosis not present

## 2021-06-01 ENCOUNTER — Telehealth: Payer: Self-pay | Admitting: *Deleted

## 2021-06-01 NOTE — Telephone Encounter (Signed)
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  The pt said that she has been doing well.  She states her neuropathy symptoms are still ongoing.  She states she is not taking any medications or supplements for her neuropathy symptoms.  The pt states her neuropathy is stable. The pt denies any new health problems.  She also denies any recent scans or biopsies related to her cancer.  The pt said that she will see her family doctor in March for her annual visit.  The pt was thanked for her continued support of the study.  The pt was informed that the nurse will call her again in July to check on her status.  The pt verbalized understanding.  The pt was told to contact Dr. Lindi Adie or her research team if she has any questions about her study participation.  Brion Aliment RN, BSN, CCRP Clinical Research Nurse Lead 06/01/2021 3:36 PM

## 2021-06-25 DIAGNOSIS — B354 Tinea corporis: Secondary | ICD-10-CM | POA: Diagnosis not present

## 2021-06-25 DIAGNOSIS — M545 Low back pain, unspecified: Secondary | ICD-10-CM | POA: Diagnosis not present

## 2021-06-25 DIAGNOSIS — N3090 Cystitis, unspecified without hematuria: Secondary | ICD-10-CM | POA: Diagnosis not present

## 2021-07-01 DIAGNOSIS — S81802A Unspecified open wound, left lower leg, initial encounter: Secondary | ICD-10-CM | POA: Diagnosis not present

## 2021-07-14 DIAGNOSIS — G629 Polyneuropathy, unspecified: Secondary | ICD-10-CM | POA: Diagnosis not present

## 2021-07-14 DIAGNOSIS — E559 Vitamin D deficiency, unspecified: Secondary | ICD-10-CM | POA: Diagnosis not present

## 2021-07-14 DIAGNOSIS — I10 Essential (primary) hypertension: Secondary | ICD-10-CM | POA: Diagnosis not present

## 2021-07-14 DIAGNOSIS — I208 Other forms of angina pectoris: Secondary | ICD-10-CM | POA: Diagnosis not present

## 2021-07-14 DIAGNOSIS — R7309 Other abnormal glucose: Secondary | ICD-10-CM | POA: Diagnosis not present

## 2021-07-14 DIAGNOSIS — Z853 Personal history of malignant neoplasm of breast: Secondary | ICD-10-CM | POA: Diagnosis not present

## 2021-07-14 DIAGNOSIS — I700 Atherosclerosis of aorta: Secondary | ICD-10-CM | POA: Diagnosis not present

## 2021-07-14 DIAGNOSIS — M899 Disorder of bone, unspecified: Secondary | ICD-10-CM | POA: Diagnosis not present

## 2021-07-14 DIAGNOSIS — E7800 Pure hypercholesterolemia, unspecified: Secondary | ICD-10-CM | POA: Diagnosis not present

## 2021-07-14 DIAGNOSIS — I25119 Atherosclerotic heart disease of native coronary artery with unspecified angina pectoris: Secondary | ICD-10-CM | POA: Diagnosis not present

## 2021-08-23 DIAGNOSIS — J309 Allergic rhinitis, unspecified: Secondary | ICD-10-CM | POA: Diagnosis not present

## 2021-11-17 DIAGNOSIS — B354 Tinea corporis: Secondary | ICD-10-CM | POA: Diagnosis not present

## 2021-11-17 DIAGNOSIS — R197 Diarrhea, unspecified: Secondary | ICD-10-CM | POA: Diagnosis not present

## 2021-11-17 DIAGNOSIS — R42 Dizziness and giddiness: Secondary | ICD-10-CM | POA: Diagnosis not present

## 2021-11-18 ENCOUNTER — Emergency Department (HOSPITAL_COMMUNITY): Payer: Medicare Other

## 2021-11-18 ENCOUNTER — Encounter (HOSPITAL_COMMUNITY): Payer: Self-pay

## 2021-11-18 ENCOUNTER — Other Ambulatory Visit: Payer: Self-pay

## 2021-11-18 ENCOUNTER — Emergency Department (HOSPITAL_COMMUNITY)
Admission: EM | Admit: 2021-11-18 | Discharge: 2021-11-18 | Disposition: A | Discharge status: Home / Self Care | Payer: Medicare Other | Attending: Emergency Medicine | Admitting: Emergency Medicine

## 2021-11-18 DIAGNOSIS — Z853 Personal history of malignant neoplasm of breast: Secondary | ICD-10-CM | POA: Diagnosis not present

## 2021-11-18 DIAGNOSIS — I10 Essential (primary) hypertension: Secondary | ICD-10-CM | POA: Insufficient documentation

## 2021-11-18 DIAGNOSIS — Z91040 Latex allergy status: Secondary | ICD-10-CM | POA: Diagnosis not present

## 2021-11-18 DIAGNOSIS — Z7982 Long term (current) use of aspirin: Secondary | ICD-10-CM | POA: Diagnosis not present

## 2021-11-18 DIAGNOSIS — I700 Atherosclerosis of aorta: Secondary | ICD-10-CM | POA: Diagnosis not present

## 2021-11-18 DIAGNOSIS — R197 Diarrhea, unspecified: Secondary | ICD-10-CM | POA: Diagnosis not present

## 2021-11-18 DIAGNOSIS — N390 Urinary tract infection, site not specified: Secondary | ICD-10-CM | POA: Insufficient documentation

## 2021-11-18 DIAGNOSIS — R1084 Generalized abdominal pain: Secondary | ICD-10-CM | POA: Diagnosis present

## 2021-11-18 DIAGNOSIS — R112 Nausea with vomiting, unspecified: Secondary | ICD-10-CM | POA: Diagnosis not present

## 2021-11-18 DIAGNOSIS — C50411 Malignant neoplasm of upper-outer quadrant of right female breast: Secondary | ICD-10-CM

## 2021-11-18 DIAGNOSIS — Z79899 Other long term (current) drug therapy: Secondary | ICD-10-CM | POA: Insufficient documentation

## 2021-11-18 DIAGNOSIS — R1110 Vomiting, unspecified: Secondary | ICD-10-CM | POA: Diagnosis not present

## 2021-11-18 DIAGNOSIS — R109 Unspecified abdominal pain: Secondary | ICD-10-CM | POA: Diagnosis not present

## 2021-11-18 DIAGNOSIS — R079 Chest pain, unspecified: Secondary | ICD-10-CM | POA: Diagnosis not present

## 2021-11-18 LAB — URINALYSIS, ROUTINE W REFLEX MICROSCOPIC
Bilirubin Urine: NEGATIVE
Glucose, UA: NEGATIVE mg/dL
Hgb urine dipstick: NEGATIVE
Ketones, ur: NEGATIVE mg/dL
Nitrite: NEGATIVE
Protein, ur: NEGATIVE mg/dL
Specific Gravity, Urine: 1.03 (ref 1.005–1.030)
pH: 6 (ref 5.0–8.0)

## 2021-11-18 LAB — CBC
HCT: 37.4 % (ref 36.0–46.0)
Hemoglobin: 11.9 g/dL — ABNORMAL LOW (ref 12.0–15.0)
MCH: 26.7 pg (ref 26.0–34.0)
MCHC: 31.8 g/dL (ref 30.0–36.0)
MCV: 84 fL (ref 80.0–100.0)
Platelets: 230 10*3/uL (ref 150–400)
RBC: 4.45 MIL/uL (ref 3.87–5.11)
RDW: 13.7 % (ref 11.5–15.5)
WBC: 7.4 10*3/uL (ref 4.0–10.5)
nRBC: 0 % (ref 0.0–0.2)

## 2021-11-18 LAB — HEPATIC FUNCTION PANEL
ALT: 14 U/L (ref 0–44)
AST: 22 U/L (ref 15–41)
Albumin: 3.8 g/dL (ref 3.5–5.0)
Alkaline Phosphatase: 78 U/L (ref 38–126)
Bilirubin, Direct: <0.1 mg/dL (ref 0.0–0.2)
Total Bilirubin: 0.5 mg/dL (ref 0.3–1.2)
Total Protein: 8.5 g/dL — ABNORMAL HIGH (ref 6.5–8.1)

## 2021-11-18 LAB — BASIC METABOLIC PANEL
Anion gap: 12 (ref 5–15)
BUN: 9 mg/dL (ref 8–23)
CO2: 27 mmol/L (ref 22–32)
Calcium: 9.1 mg/dL (ref 8.9–10.3)
Chloride: 99 mmol/L (ref 98–111)
Creatinine, Ser: 0.73 mg/dL (ref 0.44–1.00)
GFR, Estimated: >60 mL/min (ref >60)
Glucose, Bld: 112 mg/dL — ABNORMAL HIGH (ref 70–99)
Potassium: 4.4 mmol/L (ref 3.5–5.1)
Sodium: 138 mmol/L (ref 135–145)

## 2021-11-18 LAB — LIPASE, BLOOD: Lipase: 37 U/L (ref 11–51)

## 2021-11-18 LAB — CK: Total CK: 73 U/L (ref 38–234)

## 2021-11-18 LAB — TROPONIN I (HIGH SENSITIVITY)
Troponin I (High Sensitivity): 5 ng/L (ref <18)
Troponin I (High Sensitivity): 7 ng/L (ref <18)

## 2021-11-18 MED ORDER — SODIUM CHLORIDE 0.9 % IV BOLUS
1000.0000 mL | Freq: Once | INTRAVENOUS | Status: AC
  Administered 2021-11-18: 1000 mL via INTRAVENOUS

## 2021-11-18 MED ORDER — CEPHALEXIN 500 MG PO CAPS: 500.0000 mg | ORAL_CAPSULE | Freq: Two times a day (BID) | ORAL | 0 refills | Status: AC

## 2021-11-18 MED ORDER — IOHEXOL 300 MG/ML  SOLN
100.0000 mL | Freq: Once | INTRAMUSCULAR | Status: AC | PRN
Start: 2021-11-18 — End: 2021-11-18
  Administered 2021-11-18: 100 mL via INTRAVENOUS

## 2021-11-18 MED ORDER — ONDANSETRON HCL 4 MG/2ML IJ SOLN
4.0000 mg | Freq: Once | INTRAMUSCULAR | Status: AC
  Administered 2021-11-18: 4 mg via INTRAVENOUS
  Filled 2021-11-18: qty 2

## 2021-11-18 MED ORDER — ONDANSETRON 4 MG PO TBDP: 4.0000 mg | ORAL_TABLET | Freq: Three times a day (TID) | ORAL | 0 refills | Status: DC | PRN

## 2021-11-18 NOTE — ED Provider Notes (Signed)
[facility] EMERGENCY DEPARTMENT Provider Note   CSN: [id number] Arrival date & time: 11/18/21  1015     History  Chief Complaint  Patient presents with   Emesis   Chest Pain   Abdominal Pain    Angela Bond is a 76 y.o. female history of breast cancer, MI, chronic headache here for evaluation of emesis.  Patient states on Sunday she started noticing that her house was very warm.  She only has 1 window box AC in her living room.  Prior to that she was staying in her bedroom.  States she started having intermittent episodes of NBNB emesis, multiple times a day.  Also noted loose stool approximately 4 times daily.  No melena or blood per rectum.  Has had some generalized abdominal pain.  No recent antibiotics or travel.  States she has tried drinking some Pedialyte however has had recurrent vomiting.  States she is passing flatus.  Was seen by her PCP who told her to come here for IV fluids and possible dehydration.  Does note that she occasionally gets lightheaded when she goes from sitting to standing.  Also noted her urine has been darker than normal however denies any dysuria, hematuria, urinary frequency.  States she has some chronic exertional shortness of breath which is unchanged.  No cough, chest pain, back pain, lower extremity swelling. No recent sick contacts. No HA, unilateral weakness, difficulty with word findings  HPI     Home Medications Prior to Admission medications   Medication Sig Start Date End Date Taking? Authorizing Provider  cephALEXin (KEFLEX) 500 MG capsule Take 1 capsule (500 mg total) by mouth 2 (two) times daily for 7 days. 11/18/21 11/25/21 Yes Kailan Carmen A, PA-C  ondansetron (ZOFRAN-ODT) 4 MG disintegrating tablet Take 1 tablet (4 mg total) by mouth every 8 (eight) hours as needed for nausea or vomiting. 11/18/21  Yes Tewana Bohlen A, PA-C  acetaminophen (TYLENOL) 500 MG tablet Take 1 tablet (500 mg total) by mouth every 6 (six) hours  as needed for moderate pain or headache. 12/26/18   Ainsley Spinner, PA-C  ALPRAZolam Duanne Moron) 0.25 MG tablet Take 0.25 mg by mouth 2 (two) times daily as needed. 10/15/19   [provider]  aspirin EC 81 MG EC tablet Take 1 tablet (81 mg total) by mouth daily. 11/06/15   Lyda Jester M, PA-C  calcium carbonate (OSCAL) 1500 (600 Ca) MG TABS tablet Take by mouth.    [provider]  Calcium Carbonate-Vit D-Min (CALCIUM 1200 PO) Take by mouth.    [provider]  Cholecalciferol (VITAMIN D) 2000 UNITS tablet Take 2,000 Units by mouth 2 (two) times daily.     [provider]  dexlansoprazole (DEXILANT) 60 MG capsule Take 60 mg by mouth daily.    [provider]  escitalopram (LEXAPRO) 20 MG tablet Take 20 mg by mouth daily. 01/22/16   [provider]  ezetimibe (ZETIA) 10 MG tablet Take 1 tablet (10 mg total) by mouth at bedtime. 09/07/18   Jerline Pain, MD  famotidine (PEPCID) 20 MG tablet Take 20 mg by mouth at bedtime.  08/31/18   [provider]  fexofenadine (ALLEGRA) 180 MG tablet Take 180 mg by mouth daily as needed for allergies.     [provider]  furosemide (LASIX) 20 MG tablet Take one tablet daily as needed for 3 lbs over night weight gain 09/07/18   Jerline Pain, MD  gabapentin (NEURONTIN) 600 MG  tablet Take 1 tablet (600 mg total) by mouth 3 (three) times daily. 10/18/18   Hyatt, Max T, DPM  lovastatin (MEVACOR) 40 MG tablet TAKE 1 TABLET BY MOUTH AT  BEDTIME 06/29/18   Jerline Pain, MD  Magnesium 500 MG TABS Take 500 mg by mouth at bedtime.    [provider]  meclizine (ANTIVERT) 25 MG tablet Take 25 mg by mouth 3 (three) times daily as needed for dizziness.     [provider]  montelukast (SINGULAIR) 10 MG tablet Take 10 mg by mouth at bedtime. 08/23/18   [provider]  nitroGLYCERIN (NITROSTAT) 0.4 MG SL tablet Place 1 tablet (0.4 mg total) under the tongue every 5 (five) minutes as needed  for chest pain (CP or SOB). 09/07/18   Jerline Pain, MD  nystatin cream (MYCOSTATIN) Apply 1 application topically 2 (two) times daily. 12/29/20   Nicholas Lose, MD  Polyvinyl Alcohol-Povidone PF (REFRESH) 1.4-0.6 % SOLN Place 2 drops into both eyes 3 (three) times daily as needed (for dry eyes).    [provider]  potassium chloride (K-DUR) 10 MEQ tablet Take 10 mEq by mouth every 3 (three) days.    [provider]  triamcinolone cream (KENALOG) 0.1 % Apply 1 application topically 2 (two) times daily. 05/08/19   Fontaine, Belinda Block, MD  vitamin C (ASCORBIC ACID) 500 MG tablet Take 500 mg by mouth daily.    [provider]  Zinc Acetate, Oral, (ZINC ACETATE PO) Take by mouth.    [provider]      Allergies    Other, Shellfish allergy, Aspirin, Penicillins, Aleve [naproxen], Brilinta [ticagrelor], Ciprofloxacin, Doxycycline, Evista [raloxifene], Fosamax [alendronate sodium], Lactose intolerance (gi), Latex, Naproxen sodium, Nitrofurantoin, Pravachol [pravastatin], Sulfa antibiotics, Tape, Welchol [colesevelam hcl], and Zocor [simvastatin]    Review of Systems   Review of Systems  Constitutional:  Positive for activity change, appetite change and fatigue.  HENT: Negative.    Respiratory:  Positive for shortness of breath (chronic).   Gastrointestinal:  Positive for abdominal pain, diarrhea, nausea and vomiting. Negative for abdominal distention, anal bleeding, blood in stool, constipation and rectal pain.  Genitourinary: Negative.        Dark urine  Musculoskeletal: Negative.   Skin: Negative.   Neurological: Negative.   All other systems reviewed and are negative.   Physical Exam Updated Vital Signs BP (!) 175/144   Pulse 92   Temp 98.1 F (36.7 C)   Resp 16   SpO2 98%  Physical Exam Vitals and nursing note reviewed.  Constitutional:      General: She is not in acute distress.    Appearance: She is well-developed. She is not ill-appearing,  toxic-appearing or diaphoretic.  HENT:     Head: Atraumatic.  Eyes:     Pupils: Pupils are equal, round, and reactive to light.  Cardiovascular:     Rate and Rhythm: Normal rate.     Pulses:          Radial pulses are 2+ on the right side and 2+ on the left side.     Heart sounds: Normal heart sounds.  Pulmonary:     Effort: Pulmonary effort is normal. No respiratory distress.     Breath sounds: Normal breath sounds.  Chest:     Chest wall: No mass, tenderness or edema.  Abdominal:     General: Bowel sounds are normal. There is no distension.     Palpations: Abdomen is soft.  Tenderness: There is abdominal tenderness. There is no guarding or rebound.  Musculoskeletal:        General: Normal range of motion.     Cervical back: Normal range of motion.     Right lower leg: No tenderness. No edema.     Left lower leg: No tenderness. No edema.  Skin:    General: Skin is warm and dry.     Capillary Refill: Capillary refill takes less than 2 seconds.  Neurological:     General: No focal deficit present.     Mental Status: She is alert and oriented to person, place, and time.     Cranial Nerves: Cranial nerves 2-12 are intact.     Sensory: Sensation is intact.     Motor: Motor function is intact.     Coordination: Coordination is intact.  Psychiatric:        Mood and Affect: Mood normal.     ED Results / Procedures / Treatments   Labs (all labs ordered are listed, but only abnormal results are displayed) Labs Reviewed  BASIC METABOLIC PANEL - Abnormal; Notable for the following components:      Result Value   Glucose, Bld 112 (*)    All other components within normal limits  CBC - Abnormal; Notable for the following components:   Hemoglobin 11.9 (*)    All other components within normal limits  HEPATIC FUNCTION PANEL - Abnormal; Notable for the following components:   Total Protein 8.5 (*)    All other components within normal limits  URINALYSIS, ROUTINE W REFLEX  MICROSCOPIC - Abnormal; Notable for the following components:   Color, Urine STRAW (*)    Leukocytes,Ua LARGE (*)    Bacteria, UA FEW (*)    All other components within normal limits  URINE CULTURE  LIPASE, BLOOD  CK  TROPONIN I (HIGH SENSITIVITY)  TROPONIN I (HIGH SENSITIVITY)    EKG EKG Interpretation  Date/Time:  Thursday November 18 2021 10:28:19 EDT Ventricular Rate:  78 PR Interval:  144 QRS Duration: 90 QT Interval:  380 QTC Calculation: 433 R Axis:   77 Text Interpretation: Normal sinus rhythm Cannot rule out Anterior infarct , age undetermined Abnormal ECG When compared with ECG of 05-Jan-2021 06:44, PREVIOUS ECG IS PRESENT No significant change since last tracing Confirmed by Isla Pence [phone]) on 11/18/2021 5:52:48 PM  Radiology CT Abdomen Pelvis W Contrast  Result Date: 11/18/2021 CLINICAL DATA:  Emesis, abdominal pain EXAM: CT ABDOMEN AND PELVIS WITH CONTRAST TECHNIQUE: Multidetector CT imaging of the abdomen and pelvis was performed using the standard protocol following bolus administration of intravenous contrast. RADIATION DOSE REDUCTION: This exam was performed according to the departmental dose-optimization program which includes automated exposure control, adjustment of the mA and/or kV according to patient size and/or use of iterative reconstruction technique. CONTRAST:  168m OMNIPAQUE IOHEXOL 300 MG/ML  SOLN COMPARISON:  02/16/2018 FINDINGS: Lower chest: No acute pleural or parenchymal lung disease. Hepatobiliary: No focal liver abnormality is seen. Status post cholecystectomy. No biliary dilatation. Pancreas: Unremarkable. No pancreatic ductal dilatation or surrounding inflammatory changes. Spleen: Normal in size without focal abnormality. Adrenals/Urinary Tract: Adrenal glands are unremarkable. Kidneys are normal, without renal calculi, focal lesion, or hydronephrosis. Bladder is unremarkable. Stomach/Bowel: No bowel obstruction or ileus. No bowel wall thickening or  inflammatory change. Stable postsurgical changes at the gastroesophageal junction, with small hiatal hernia identified. Vascular/Lymphatic: Aortic atherosclerosis. No enlarged abdominal or pelvic lymph nodes. Reproductive: Uterus and bilateral adnexa are unremarkable. Other: No  free fluid or free intraperitoneal gas. No abdominal wall hernia. Musculoskeletal: No acute or destructive bony lesions. Postsurgical changes are seen from L4 through S1. Reconstructed images demonstrate no additional findings. IMPRESSION: 1. No acute intra-abdominal or intrapelvic process. 2. Small hiatal hernia. 3.  Aortic Atherosclerosis (ICD10-I70.0). Electronically Signed   By: Randa Ngo M.D.   On: 11/18/2021 20:19   DG Chest 2 View  Result Date: 11/18/2021 CLINICAL DATA:  Chest pain, diarrhea for 1 month, vomiting for 4 days, dehydration, shortness of breath with exertion, history hypertension, MI, breast cancer, pneumonia EXAM: CHEST - 2 VIEW COMPARISON:  01/05/2021; correlation CT chest 01/19/2021 FINDINGS: Normal heart size, mediastinal contours, and pulmonary vascularity. Atherosclerotic calcification aorta. Prominence of RIGHT hilum again seen, corresponds to vascular structures on prior CT. Lungs clear without pulmonary infiltrate, pleural effusion, or pneumothorax. Diffuse osseous demineralization. IMPRESSION: No acute abnormalities. Aortic Atherosclerosis (ICD10-I70.0). Electronically Signed   By: Lavonia Dana M.D.   On: 11/18/2021 11:07    Procedures Procedures    Medications Ordered in ED Medications  sodium chloride 0.9 % bolus 1,000 mL (1,000 mLs Intravenous New Bag/Given 11/18/21 1857)  ondansetron (ZOFRAN) injection 4 mg (4 mg Intravenous Given 11/18/21 1856)  iohexol (OMNIPAQUE) 300 MG/ML solution 100 mL (100 mLs Intravenous Contrast Given 11/18/21 2013)   ED Course/ Medical Decision Making/ A&P    76 year old here for evaluation of vomiting, diarrhea and feeling of generalized weakness.  Some generalized  abdominal pain which she relates to vomiting.  She is passing flatus.  No melena or bright red blood per rectum.  No recent sick contacts, travel or antibiotics.  Has noted some dark urine and some increased urgency.  Labs and imaging personally viewed and interpreted: CBC without leukocytosis Metabolic panel without significant malady UA large leuks with bacteria, sent for culture.  Given symptomatic we will start antibiotics as tolerated Keflex in the past per chart review Lipase 37 CK 73 Delta troponin flat EKG without ischemic changes Chest x-ray without cardiomegaly, pulm edema, pneumothorax CT abdomen pelvis without significant normality  Patient reassessed.  Feels significantly improved with IV fluids.  She is tolerating p.o. intake.  Repeat benign abdominal exam.  She is ambulatory here without difficulty. Neg orthostatic VS. will write for symptomatic management.  I let her know her urine is pending culture however given symptoms will start on antibiotics.  She is agreeable for close outpatient follow-up.  Will return for any worsening symptoms  Patient is nontoxic, nonseptic appearing, in no apparent distress.  Patient's pain and other symptoms adequately managed in emergency department.  Fluid bolus given.  Labs, imaging and vitals reviewed.  Patient does not meet the SIRS or Sepsis criteria.  On repeat exam patient does not have a surgical abdomin and there are no peritoneal signs.  No indication of appendicitis, bowel obstruction, bowel perforation, cholecystitis, diverticulitis, atypical ACS, PE, dissection.  Patient discharged home with symptomatic treatment and given strict instructions for follow-up with their primary care physician.  I have also discussed reasons to return immediately to the ER.  Patient expresses understanding and agrees with plan.                               Medical Decision Making Amount and/or Complexity of Data Reviewed External Data Reviewed: labs,  radiology, ECG and notes. Labs: ordered. Decision-making details documented in ED Course. Radiology: ordered and independent interpretation performed. Decision-making details documented in ED Course. ECG/medicine  tests: ordered and independent interpretation performed. Decision-making details documented in ED Course.  Risk OTC drugs. Prescription drug management. Parenteral controlled substances. Diagnosis or treatment significantly limited by social determinants of health.           Final Clinical Impression(s) / ED Diagnoses Final diagnoses:  Lower urinary tract infectious disease  Nausea vomiting and diarrhea    Rx / DC Orders ED Discharge Orders          Ordered    cephALEXin (KEFLEX) 500 MG capsule  2 times daily        11/18/21 2203    ondansetron (ZOFRAN-ODT) 4 MG disintegrating tablet  Every 8 hours PRN        11/18/21 2203              [place] Mielke A, PA-C 11/18/21 2244    Isla Pence, MD 11/19/21 1559

## 2021-11-18 NOTE — ED Notes (Signed)
Pt verbalized understanding of d/c instructions, meds, and followup care. Denies questions. VSS, no distress noted. Steady gait to exit with all belongings.  ?

## 2021-11-18 NOTE — ED Triage Notes (Signed)
Pt states she began having frequent episodes of emesis starting on Sunday. Pt relates this to having no A/C in her home and becoming too hot. Pt also c/o abdominal pain, non-radiating CP, and SOB. Hx of MI.

## 2021-11-18 NOTE — Discharge Instructions (Addendum)
I have written you for a few medications to help with your symptoms.  Keflex is an antibiotic for your urinary tract infection Zofran is a nausea medicine that you dissolve under your tongue for nausea and vomiting.  Make sure to drink plenty of fluids and rest.  Stay in an air conditioned area so you do not overheat  Follow-up with primary care provider within a few days for reevaluation  Return for new or worsening symptoms

## 2021-11-18 NOTE — ED Provider Triage Note (Signed)
Emergency Medicine Provider Triage Evaluation Note  Angela Bond , a 76 y.o. female  was evaluated in triage.  Pt complains of several episodes of emesis since Sunday.  Believes it is due to her house not having AC and feeling too hot.  Also with some abdominal pain and shortness of breath.  Hx of MI, headaches, aortic atherosclerosis.  Also with anxiety depression.  Denies syncope, LOC, or recent fever.  Review of Systems  Positive:  Negative: See above  Physical Exam  BP (!) 186/98 (BP Location: Right Arm)   Pulse 83   Temp 98.5 F (36.9 C) (Oral)   Resp 16   SpO2 97%  Gen:   Awake, no distress   Resp:  Normal effort, CTAB MSK:   Moves extremities without difficulty  Other:  RRR without M/R/G.  Hypertensive.  Medical Decision Making  Medically screening exam initiated at 10:39 AM.  Appropriate orders placed.  Angela Bond was informed that the remainder of the evaluation will be completed by another provider, this initial triage assessment does not replace that evaluation, and the importance of remaining in the ED until their evaluation is complete.     Prince Rome, PA-C 30/86/57 1050

## 2021-11-18 NOTE — ED Notes (Signed)
Pt ambulates to restroom

## 2021-11-19 ENCOUNTER — Inpatient Hospital Stay: Payer: Medicare Other

## 2021-11-21 LAB — URINE CULTURE: Culture: >=100000 — AB

## 2021-11-22 ENCOUNTER — Telehealth (HOSPITAL_BASED_OUTPATIENT_CLINIC_OR_DEPARTMENT_OTHER): Payer: Self-pay | Admitting: *Deleted

## 2021-11-22 NOTE — Telephone Encounter (Signed)
Post ED Visit - Positive Culture Follow-up  Culture report reviewed by antimicrobial stewardship pharmacist: [place] Team '[]'$  Elenor Quinones, Pharm.D. '[]'$  Heide Guile, Pharm.D., BCPS AQ-ID '[]'$  Parks Neptune, Pharm.D., BCPS '[]'$  Alycia Rossetti, Pharm.D., BCPS '[]'$  [place], Pharm.D., BCPS, AAHIVP '[]'$  Legrand Como, Pharm.D., BCPS, AAHIVP '[]'$  Salome Arnt, PharmD, BCPS '[]'$  Johnnette Gourd, PharmD, BCPS '[]'$  Hughes Better, PharmD, BCPS '[]'$  Leeroy Cha, PharmD '[]'$  Laqueta Linden, PharmD, BCPS '[x]'$  Darlin Priestly, PharmD  [place] Team '[]'$  Leodis Sias, PharmD '[]'$  Lindell Spar, PharmD '[]'$  Royetta Asal, PharmD '[]'$  Graylin Shiver, Rph '[]'$  Rema Fendt) Glennon Mac, PharmD '[]'$  Arlyn Dunning, PharmD '[]'$  Netta Cedars, PharmD '[]'$  Dia Sitter, PharmD '[]'$  Leone Haven, PharmD '[]'$  Gretta Arab, PharmD '[]'$  Theodis Shove, PharmD '[]'$  Peggyann Juba, PharmD '[]'$  Reuel Boom, PharmD   Positive urine culture Treated with Cephalexin, organism sensitive to the same and no further patient follow-up is required at this time.  Rosie Fate 11/22/2021, 12:29 PM

## 2021-11-23 ENCOUNTER — Other Ambulatory Visit: Payer: Medicare Other

## 2021-12-03 ENCOUNTER — Encounter: Payer: Self-pay | Admitting: *Deleted

## 2021-12-03 DIAGNOSIS — Z171 Estrogen receptor negative status [ER-]: Secondary | ICD-10-CM

## 2021-12-03 NOTE — Research (Signed)
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  The pt said that she has been doing well overall.  She states her neuropathy symptoms are still ongoing.  She states she is not taking any medications or supplements for her neuropathy symptoms.  The pt states her neuropathy is stable. She said that it is just a condition that she "has learned to live with".  The pt said that she recently was seen in the ED for dehydration.  She said that she had some scans in July due to her nausea/abdominal pain during her ER visit, but the scans were negative for any new problems.  She also denies any recent scans or biopsies related to her cancer.  The pt said that she will see Dr. Lindi Adie next week on 12/08/21 for her routine cancer follow up The pt was thanked for her participation in the study.  The pt was informed that this is her month 12 call,and her study participation is now completed.  The pt verbalized understanding.  The pt was told to contact Dr. Lindi Adie or her research team if she has any questions.   Brion Aliment RN, BSN, CCRP Clinical Research Nurse Lead 12/03/2021 10:34 AM

## 2021-12-06 NOTE — Progress Notes (Signed)
Patient Care Team: Gaynelle Arabian, MD as PCP - General (Family Medicine) Jerline Pain, MD as PCP - Cardiology (Cardiology) Wonda Horner, MD as Consulting Physician (Gastroenterology) Ralene Ok, MD as Consulting Physician (Surgery) Nicholas Lose, MD as Consulting Physician (Hematology and Oncology) Sylvan Cheese, NP as Nurse Practitioner (Hematology and Oncology) Jovita Kussmaul, MD as Consulting Physician (General Surgery) Thea Silversmith, MD as Consulting Physician (Radiation Oncology)  DIAGNOSIS: No diagnosis found.  SUMMARY OF ONCOLOGIC HISTORY: Oncology History  Breast cancer of upper-outer quadrant of right female breast ([place])  07/30/2014 Mammogram   Right breast: Mass at 10 o'clock location 4 cm from the nipple measuring 1.7 x 1.3 x 1.3 cm. This corresponds to the palpable and mammographic finding. In the right axilla, there are several lymph nodes which are normal in size measuring up to 36m.   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%), PR- (0%), HER2/neu negative, Ki67 30%   08/27/2014 Clinical Stage   Stage IIA: T2 N0   09/22/2014 Definitive Surgery   Right lumpectomy/SLNB (Marlou Starks: Invasive ductal carcinoma, grade 3, 2.6 cm, high grade DCIS, LVI present, ER-, PR-, 2 LN removed and 1 positive for metastatic carcinoma (1/2)   09/22/2014 Pathologic Stage   Stage IIB: T2 N1 M0   10/15/2014 - 12/16/2014 Chemotherapy   Adjuvant chemotherapy with Taxotere Cytoxan 4 q3 weeks   10/21/2014 - 10/24/2014 Hospital Admission   Admission for neutropenic fever UTI Klebsiella   01/26/2015 - 03/11/2015 Radiation Therapy   Adjuvant RT (Pablo Ledger: Right breast / 45 Gray @ 1.8 GPearline Cablesper fraction x 25 fractions Right supraclavicular fossa/PAB  45 Gy '@1' .8 Gy per fraction x 25 fractions Right breast boost / 16 Gray at 2[employer] Corporationper fraction x 8 fractions   05/19/2015 Survivorship   Survivorship care plan visit  completed.   03/28/2018 Surgery   Laparoscopic cholecystectomy     CHIEF COMPLIANT: Evaluation of skin issues    INTERVAL HISTORY: BSERAH NICOLETTIis a 76year old above-mentioned history of breast cancer who was started on clinical trial for peripheral neuropathy in July. She presents to the clinic today for a follow-up.    ALLERGIES:  is allergic to other, shellfish allergy, aspirin, penicillins, aleve [naproxen], brilinta [ticagrelor], ciprofloxacin, doxycycline, evista [raloxifene], fosamax [alendronate sodium], lactose intolerance (gi), latex, naproxen sodium, nitrofurantoin, pravachol [pravastatin], sulfa antibiotics, tape, welchol [colesevelam hcl], and zocor [simvastatin].  MEDICATIONS:  Current Outpatient Medications  Medication Sig Dispense Refill   acetaminophen (TYLENOL) 500 MG tablet Take 1 tablet (500 mg total) by mouth every 6 (six) hours as needed for moderate pain or headache. 30 tablet 0   ALPRAZolam (XANAX) 0.25 MG tablet Take 0.25 mg by mouth 2 (two) times daily as needed.     aspirin EC 81 MG EC tablet Take 1 tablet (81 mg total) by mouth daily.     calcium carbonate (OSCAL) 1500 (600 Ca) MG TABS tablet Take by mouth.     Calcium Carbonate-Vit D-Min (CALCIUM 1200 PO) Take by mouth.     Cholecalciferol (VITAMIN D) 2000 UNITS tablet Take 2,000 Units by mouth 2 (two) times daily.      dexlansoprazole (DEXILANT) 60 MG capsule Take 60 mg by mouth daily.     escitalopram (LEXAPRO) 20 MG tablet Take 20 mg by mouth daily.     ezetimibe (ZETIA) 10 MG tablet Take 1 tablet (10 mg total) by mouth at bedtime. 90 tablet 3  famotidine (PEPCID) 20 MG tablet Take 20 mg by mouth at bedtime.      fexofenadine (ALLEGRA) 180 MG tablet Take 180 mg by mouth daily as needed for allergies.      furosemide (LASIX) 20 MG tablet Take one tablet daily as needed for 3 lbs over night weight gain 30 tablet 3   gabapentin (NEURONTIN) 600 MG tablet Take 1 tablet (600 mg total) by mouth 3 (three)  times daily. 90 tablet 0   lovastatin (MEVACOR) 40 MG tablet TAKE 1 TABLET BY MOUTH AT  BEDTIME 90 tablet 2   Magnesium 500 MG TABS Take 500 mg by mouth at bedtime.     meclizine (ANTIVERT) 25 MG tablet Take 25 mg by mouth 3 (three) times daily as needed for dizziness.      montelukast (SINGULAIR) 10 MG tablet Take 10 mg by mouth at bedtime.     nitroGLYCERIN (NITROSTAT) 0.4 MG SL tablet Place 1 tablet (0.4 mg total) under the tongue every 5 (five) minutes as needed for chest pain (CP or SOB). 25 tablet 6   nystatin cream (MYCOSTATIN) Apply 1 application topically 2 (two) times daily. 30 g 4   ondansetron (ZOFRAN-ODT) 4 MG disintegrating tablet Take 1 tablet (4 mg total) by mouth every 8 (eight) hours as needed for nausea or vomiting. 20 tablet 0   Polyvinyl Alcohol-Povidone PF (REFRESH) 1.4-0.6 % SOLN Place 2 drops into both eyes 3 (three) times daily as needed (for dry eyes).     potassium chloride (K-DUR) 10 MEQ tablet Take 10 mEq by mouth every 3 (three) days.     triamcinolone cream (KENALOG) 0.1 % Apply 1 application topically 2 (two) times daily. 30 g 4   vitamin C (ASCORBIC ACID) 500 MG tablet Take 500 mg by mouth daily.     Zinc Acetate, Oral, (ZINC ACETATE PO) Take by mouth.     No current facility-administered medications for this visit.    PHYSICAL EXAMINATION: ECOG PERFORMANCE STATUS: {CHL ONC ECOG [phone]}  There were no vitals filed for this visit. There were no vitals filed for this visit.  BREAST:*** No palpable masses or nodules in either right or left breasts. No palpable axillary supraclavicular or infraclavicular adenopathy no breast tenderness or nipple discharge. (exam performed in the presence of a chaperone)  LABORATORY DATA:  I have reviewed the data as listed    Latest Ref Rng & Units 11/18/2021    6:19 PM 11/18/2021   10:36 AM 01/05/2021   10:40 AM  CMP  Glucose 70 - 99 mg/dL  112    BUN 8 - 23 mg/dL  9    Creatinine 0.44 - 1.00 mg/dL  0.73    Sodium  135 - 145 mmol/L  138    Potassium 3.5 - 5.1 mmol/L  4.4    Chloride 98 - 111 mmol/L  99    CO2 22 - 32 mmol/L  27    Calcium 8.9 - 10.3 mg/dL  9.1    Total Protein 6.5 - 8.1 g/dL 8.5   7.8   Total Bilirubin 0.3 - 1.2 mg/dL 0.5   0.9   Alkaline Phos 38 - 126 U/L 78   70   AST 15 - 41 U/L 22   19   ALT 0 - 44 U/L 14   12     Lab Results  Component Value Date   WBC 7.4 11/18/2021   HGB 11.9 (L) 11/18/2021   HCT 37.4 11/18/2021   MCV 84.0  11/18/2021   PLT 230 11/18/2021   NEUTROABS 3.9 11/19/2020    ASSESSMENT & PLAN:  No problem-specific Assessment & Plan notes found for this encounter.    No orders of the defined types were placed in this encounter.  The patient has a good understanding of the overall plan. she agrees with it. she will call with any problems that may develop before the next visit here. Total time spent: 30 mins including face to face time and time spent for planning, charting and co-ordination of care   Suzzette Righter, [place] 12/07/21    I Gardiner Coins am scribing for Dr. Lindi Adie  ***

## 2021-12-08 ENCOUNTER — Other Ambulatory Visit: Payer: Self-pay | Admitting: Hematology and Oncology

## 2021-12-08 ENCOUNTER — Inpatient Hospital Stay: Payer: Medicare Other

## 2021-12-08 ENCOUNTER — Inpatient Hospital Stay: Payer: Medicare Other | Attending: Hematology and Oncology | Admitting: Hematology and Oncology

## 2021-12-08 ENCOUNTER — Telehealth: Payer: Self-pay | Admitting: Licensed Clinical Social Worker

## 2021-12-08 ENCOUNTER — Other Ambulatory Visit: Payer: Self-pay

## 2021-12-08 DIAGNOSIS — Z171 Estrogen receptor negative status [ER-]: Secondary | ICD-10-CM

## 2021-12-08 DIAGNOSIS — Z9221 Personal history of antineoplastic chemotherapy: Secondary | ICD-10-CM | POA: Insufficient documentation

## 2021-12-08 DIAGNOSIS — Z853 Personal history of malignant neoplasm of breast: Secondary | ICD-10-CM | POA: Insufficient documentation

## 2021-12-08 DIAGNOSIS — Z79899 Other long term (current) drug therapy: Secondary | ICD-10-CM | POA: Diagnosis not present

## 2021-12-08 DIAGNOSIS — D509 Iron deficiency anemia, unspecified: Secondary | ICD-10-CM | POA: Insufficient documentation

## 2021-12-08 DIAGNOSIS — F329 Major depressive disorder, single episode, unspecified: Secondary | ICD-10-CM | POA: Diagnosis not present

## 2021-12-08 DIAGNOSIS — G629 Polyneuropathy, unspecified: Secondary | ICD-10-CM | POA: Insufficient documentation

## 2021-12-08 DIAGNOSIS — C50411 Malignant neoplasm of upper-outer quadrant of right female breast: Secondary | ICD-10-CM

## 2021-12-08 DIAGNOSIS — K909 Intestinal malabsorption, unspecified: Secondary | ICD-10-CM | POA: Diagnosis not present

## 2021-12-08 DIAGNOSIS — Z923 Personal history of irradiation: Secondary | ICD-10-CM | POA: Diagnosis not present

## 2021-12-08 LAB — CBC WITH DIFFERENTIAL (CANCER CENTER ONLY)
Abs Immature Granulocytes: 0.02 10*3/uL (ref 0.00–0.07)
Basophils Absolute: 0 10*3/uL (ref 0.0–0.1)
Basophils Relative: 0 %
Eosinophils Absolute: 0.5 10*3/uL (ref 0.0–0.5)
Eosinophils Relative: 7 %
HCT: 33.6 % — ABNORMAL LOW (ref 36.0–46.0)
Hemoglobin: 11.1 g/dL — ABNORMAL LOW (ref 12.0–15.0)
Immature Granulocytes: 0 %
Lymphocytes Relative: 18 %
Lymphs Abs: 1.3 10*3/uL (ref 0.7–4.0)
MCH: 27.6 pg (ref 26.0–34.0)
MCHC: 33 g/dL (ref 30.0–36.0)
MCV: 83.6 fL (ref 80.0–100.0)
Monocytes Absolute: 0.6 10*3/uL (ref 0.1–1.0)
Monocytes Relative: 8 %
Neutro Abs: 4.7 10*3/uL (ref 1.7–7.7)
Neutrophils Relative %: 67 %
Platelet Count: 220 10*3/uL (ref 150–400)
RBC: 4.02 MIL/uL (ref 3.87–5.11)
RDW: 14.1 % (ref 11.5–15.5)
WBC Count: 7 10*3/uL (ref 4.0–10.5)
nRBC: 0 % (ref 0.0–0.2)

## 2021-12-08 LAB — CMP (CANCER CENTER ONLY)
ALT: 10 U/L (ref 0–44)
AST: 15 U/L (ref 15–41)
Albumin: 3.7 g/dL (ref 3.5–5.0)
Alkaline Phosphatase: 71 U/L (ref 38–126)
Anion gap: 4 — ABNORMAL LOW (ref 5–15)
BUN: 10 mg/dL (ref 8–23)
CO2: 33 mmol/L — ABNORMAL HIGH (ref 22–32)
Calcium: 8.9 mg/dL (ref 8.9–10.3)
Chloride: 102 mmol/L (ref 98–111)
Creatinine: 0.65 mg/dL (ref 0.44–1.00)
GFR, Estimated: >60 mL/min (ref >60)
Glucose, Bld: 89 mg/dL (ref 70–99)
Potassium: 3.8 mmol/L (ref 3.5–5.1)
Sodium: 139 mmol/L (ref 135–145)
Total Bilirubin: 0.4 mg/dL (ref 0.3–1.2)
Total Protein: 7.6 g/dL (ref 6.5–8.1)

## 2021-12-08 LAB — IRON AND IRON BINDING CAPACITY (CC-WL,HP ONLY)
Iron: 35 ug/dL (ref 28–170)
Saturation Ratios: 7 % — ABNORMAL LOW (ref 10.4–31.8)
TIBC: 486 ug/dL — ABNORMAL HIGH (ref 250–450)
UIBC: 451 ug/dL — ABNORMAL HIGH (ref 148–442)

## 2021-12-08 LAB — FERRITIN: Ferritin: 10 ng/mL — ABNORMAL LOW (ref 11–307)

## 2021-12-08 MED ORDER — ESCITALOPRAM OXALATE 20 MG PO TABS: 20.0000 mg | ORAL_TABLET | Freq: Every day | ORAL | 3 refills | Status: AC

## 2021-12-08 MED ORDER — NYSTATIN 100000 UNIT/GM EX CREA
1.0000 | TOPICAL_CREAM | Freq: Two times a day (BID) | CUTANEOUS | 4 refills | Status: DC
Start: 2021-12-08 — End: 2023-02-27

## 2021-12-08 NOTE — Progress Notes (Signed)
I called the patient and discussed the iron study results with her.  She is significantly iron deficient and I recommended 3 doses of IV iron therapy.

## 2021-12-08 NOTE — Assessment & Plan Note (Addendum)
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  Major depression: She ran out of Lexapro.  I sent a new prescription today.  Peripheral neuropathy: Reports improvement in the neuropathy symptoms and she rates it as 4 out of 10.  Return to clinic in 1 year for follow-up

## 2021-12-08 NOTE — Patient Outreach (Signed)
  Care Management  Outreach Note  12/08/2021 Name: Angela Bond MRN: [id number] DOB: 1945/07/14  An unsuccessful telephone outreach was attempted today. The patient was referred to the case management team for assistance with care management and care coordination.   Follow Up Plan:  The care management team will reach out to the patient again over the next 5 to 7 days.   [name], [place] [phone]

## 2021-12-09 ENCOUNTER — Telehealth: Payer: Self-pay | Admitting: Hematology and Oncology

## 2021-12-09 NOTE — Telephone Encounter (Signed)
Scheduled appointment per 7/26 scheduling message as well as a 7/27 secure chat. Patient is aware.

## 2021-12-10 ENCOUNTER — Encounter: Payer: Self-pay | Admitting: *Deleted

## 2021-12-10 ENCOUNTER — Other Ambulatory Visit: Payer: Self-pay

## 2021-12-10 ENCOUNTER — Inpatient Hospital Stay: Payer: Medicare Other

## 2021-12-10 VITALS — BP 162/83 | HR 69 | Temp 98.2°F | Resp 18

## 2021-12-10 DIAGNOSIS — D509 Iron deficiency anemia, unspecified: Secondary | ICD-10-CM | POA: Diagnosis not present

## 2021-12-10 DIAGNOSIS — K909 Intestinal malabsorption, unspecified: Secondary | ICD-10-CM | POA: Diagnosis not present

## 2021-12-10 DIAGNOSIS — Z853 Personal history of malignant neoplasm of breast: Secondary | ICD-10-CM | POA: Diagnosis not present

## 2021-12-10 DIAGNOSIS — Z79899 Other long term (current) drug therapy: Secondary | ICD-10-CM | POA: Diagnosis not present

## 2021-12-10 DIAGNOSIS — D508 Other iron deficiency anemias: Secondary | ICD-10-CM

## 2021-12-10 DIAGNOSIS — Z923 Personal history of irradiation: Secondary | ICD-10-CM | POA: Diagnosis not present

## 2021-12-10 DIAGNOSIS — G629 Polyneuropathy, unspecified: Secondary | ICD-10-CM | POA: Diagnosis not present

## 2021-12-10 DIAGNOSIS — Z9221 Personal history of antineoplastic chemotherapy: Secondary | ICD-10-CM | POA: Diagnosis not present

## 2021-12-10 MED ORDER — SODIUM CHLORIDE 0.9 % IV SOLN: Freq: Once | INTRAVENOUS | Status: AC

## 2021-12-10 MED ORDER — SODIUM CHLORIDE 0.9 % IV SOLN
300.0000 mg | Freq: Once | INTRAVENOUS | Status: AC
  Administered 2021-12-10: 300 mg via INTRAVENOUS
  Filled 2021-12-10: qty 300

## 2021-12-10 NOTE — Progress Notes (Signed)
Due to pt poor IV venous access and several unsuccessful attempts of IV placement today, MD requesting pt to receive IV venofer 300 mg IV once today (if IV access is obtainable) and ordered IV monoferric x1 with expected next dose next week to prevent 3-4 tx days with IV Venofer.  PA team working on stat PA.  Infusion appts will be adjusted once PA is obtained.

## 2021-12-10 NOTE — Patient Instructions (Signed)

## 2021-12-13 ENCOUNTER — Ambulatory Visit: Payer: Self-pay | Admitting: Licensed Clinical Social Worker

## 2021-12-13 ENCOUNTER — Other Ambulatory Visit: Payer: Self-pay | Admitting: Hematology and Oncology

## 2021-12-13 NOTE — Progress Notes (Signed)
Patient's insurance will not cover Monoferric. I will order for Feraheme x1 dose since she got 1 dose of Venofer

## 2021-12-13 NOTE — Patient Outreach (Signed)
  Care Coordination  Unsuccessful outreach Note   12/13/2021 Name: Angela Bond MRN: [id number] DOB: 06-13-1945  Angela Bond is a 76 y.o. year old female who sees Gaynelle Arabian, MD for primary care.   What matters to the patients health and wellness today?   Unable to reach patient   Goals Addressed   None     SDOH assessments and interventions completed:   No   Care Coordination Interventions Activated:  No Care Coordination Interventions:  No, not indicated  Follow up plan:  will call gain in 7 to 10  days  Encounter Outcome:  No Answer  [name], [place] [phone]

## 2021-12-14 DIAGNOSIS — R300 Dysuria: Secondary | ICD-10-CM | POA: Diagnosis not present

## 2021-12-15 ENCOUNTER — Ambulatory Visit: Payer: Self-pay | Admitting: Licensed Clinical Social Worker

## 2021-12-15 NOTE — Patient Outreach (Signed)
  Care Coordination   Initial Visit Note   12/15/2021 Name: Angela Bond MRN: [id number] DOB: 1946-03-13  Angela Bond is a 76 y.o. year old female who sees Angela Arabian, MD for primary care. I spoke with  Angela Bond by phone today  What matters to the patients health and wellness today?  Having enough food    Goals Addressed             This Visit's Progress    Extra food options       Care Coordination Interventions: PHQ2/PHQ9 completed Problem Solving /Task Center strategies reviewed Reviewed mental health medications and discussed importance of compliance: reports no missed doses Suicidal Ideation/Homicidal Ideation assessed: denies Made referral to care guide for food resources Declined referral for RN care coordination          SDOH assessments and interventions completed:  Yes  SDOH Interventions Today    Flowsheet Row Most Recent Value  SDOH Interventions   Food Insecurity Interventions Other (Comment)  [referral mader to care guide for food options]  Financial Strain Interventions Intervention Not Indicated  Housing Interventions Intervention Not Indicated  Stress Interventions Intervention Not Indicated  Social Connections Interventions Intervention Not Indicated  Transportation Interventions Intervention Not Indicated  Depression Interventions/Treatment  Currently on Treatment  [not interested in counseling at this time]       Care Coordination Interventions Activated:  Yes  Care Coordination Interventions:  Yes, provided   Follow up plan: No further intervention required by LCSW Referral made to Care guide for food resources.   Encounter Outcome:  Pt. Visit Completed   [name], [place] [phone]

## 2021-12-15 NOTE — Telephone Encounter (Signed)
   Telephone encounter was:  Unsuccessful.  12/15/2021 Name: Angela Bond MRN: [id number] DOB: 1945/12/31  Unsuccessful outbound call made today to assist with:  Food Insecurity  Outreach Attempt:  1st Attempt  A HIPAA compliant voice message was left requesting a return call.  Instructed patient to call back at   Instructed patient to call back at [phone]  at their earliest convenience. .  [place], Population Health [phone] [address] , [place] [address] Email : [email or web address]

## 2021-12-15 NOTE — Patient Instructions (Signed)
Visit Information  Thank you for taking time to visit with me today. Please don't hesitate to contact me if I can be of assistance to you.   Following are the goals we discussed today:   Goals Addressed             This Visit's Progress    Extra food options       Care Coordination Interventions: PHQ2/PHQ9 completed Problem Solving /Task Center strategies reviewed Reviewed mental health medications and discussed importance of compliance: reports no missed doses Suicidal Ideation/Homicidal Ideation assessed: denies Made referral to care guide for food resources Declined referral for RN care coordination         Please call the care guide team at [phone] if you need to cancel or reschedule your appointment.   If you are experiencing a Mental Health or [place] or need someone to talk to, please call the Suicide and Crisis Lifeline: 988 call 1-800-273-TALK (toll free, 24 hour hotline)   The patient verbalized understanding of instructions, educational materials, and care plan provided today and DECLINED offer to receive copy of patient instructions, educational materials, and care plan.   No further follow up required: you declined ongoing services and support  Angela Bond, Angela Bond [phone]

## 2021-12-17 ENCOUNTER — Inpatient Hospital Stay: Payer: Medicare Other | Attending: Hematology and Oncology

## 2021-12-17 ENCOUNTER — Other Ambulatory Visit: Payer: Self-pay

## 2021-12-17 VITALS — BP 167/77 | HR 71 | Resp 16

## 2021-12-17 DIAGNOSIS — C50411 Malignant neoplasm of upper-outer quadrant of right female breast: Secondary | ICD-10-CM | POA: Diagnosis not present

## 2021-12-17 DIAGNOSIS — Z79899 Other long term (current) drug therapy: Secondary | ICD-10-CM | POA: Insufficient documentation

## 2021-12-17 DIAGNOSIS — D508 Other iron deficiency anemias: Secondary | ICD-10-CM

## 2021-12-17 DIAGNOSIS — D509 Iron deficiency anemia, unspecified: Secondary | ICD-10-CM | POA: Insufficient documentation

## 2021-12-17 MED ORDER — SODIUM CHLORIDE 0.9 % IV SOLN: Freq: Once | INTRAVENOUS | Status: AC

## 2021-12-17 MED ORDER — SODIUM CHLORIDE 0.9 % IV SOLN
510.0000 mg | Freq: Once | INTRAVENOUS | Status: AC
  Administered 2021-12-17: 510 mg via INTRAVENOUS
  Filled 2021-12-17: qty 510

## 2021-12-17 NOTE — Patient Instructions (Signed)

## 2021-12-24 ENCOUNTER — Inpatient Hospital Stay: Payer: Medicare Other

## 2021-12-27 ENCOUNTER — Telehealth: Payer: Self-pay | Admitting: *Deleted

## 2021-12-30 ENCOUNTER — Telehealth: Payer: Self-pay | Admitting: *Deleted

## 2022-01-03 ENCOUNTER — Telehealth: Payer: Self-pay | Admitting: Hematology and Oncology

## 2022-01-03 NOTE — Telephone Encounter (Signed)
Rescheduled appointment per provider PAL. Patient is aware of the changes made to her upcoming appointments. 

## 2022-01-18 ENCOUNTER — Other Ambulatory Visit: Payer: Self-pay | Admitting: *Deleted

## 2022-01-18 ENCOUNTER — Ambulatory Visit: Payer: Medicare Other | Admitting: Nurse Practitioner

## 2022-01-18 DIAGNOSIS — D508 Other iron deficiency anemias: Secondary | ICD-10-CM

## 2022-01-19 ENCOUNTER — Other Ambulatory Visit: Payer: Self-pay

## 2022-01-19 ENCOUNTER — Inpatient Hospital Stay: Payer: Medicare Other | Attending: Hematology and Oncology

## 2022-01-19 DIAGNOSIS — Z79899 Other long term (current) drug therapy: Secondary | ICD-10-CM | POA: Diagnosis not present

## 2022-01-19 DIAGNOSIS — Z853 Personal history of malignant neoplasm of breast: Secondary | ICD-10-CM | POA: Diagnosis not present

## 2022-01-19 DIAGNOSIS — D508 Other iron deficiency anemias: Secondary | ICD-10-CM | POA: Insufficient documentation

## 2022-01-19 DIAGNOSIS — K909 Intestinal malabsorption, unspecified: Secondary | ICD-10-CM | POA: Diagnosis not present

## 2022-01-19 LAB — CMP (CANCER CENTER ONLY)
ALT: 9 U/L (ref 0–44)
AST: 14 U/L — ABNORMAL LOW (ref 15–41)
Albumin: 3.9 g/dL (ref 3.5–5.0)
Alkaline Phosphatase: 69 U/L (ref 38–126)
Anion gap: 7 (ref 5–15)
BUN: 8 mg/dL (ref 8–23)
CO2: 32 mmol/L (ref 22–32)
Calcium: 9.3 mg/dL (ref 8.9–10.3)
Chloride: 101 mmol/L (ref 98–111)
Creatinine: 0.67 mg/dL (ref 0.44–1.00)
GFR, Estimated: >60 mL/min (ref >60)
Glucose, Bld: 103 mg/dL — ABNORMAL HIGH (ref 70–99)
Potassium: 3.7 mmol/L (ref 3.5–5.1)
Sodium: 140 mmol/L (ref 135–145)
Total Bilirubin: 0.5 mg/dL (ref 0.3–1.2)
Total Protein: 7.7 g/dL (ref 6.5–8.1)

## 2022-01-19 LAB — CBC WITH DIFFERENTIAL (CANCER CENTER ONLY)
Abs Immature Granulocytes: 0.03 10*3/uL (ref 0.00–0.07)
Basophils Absolute: 0 10*3/uL (ref 0.0–0.1)
Basophils Relative: 1 %
Eosinophils Absolute: 0.4 10*3/uL (ref 0.0–0.5)
Eosinophils Relative: 6 %
HCT: 38.5 % (ref 36.0–46.0)
Hemoglobin: 12.9 g/dL (ref 12.0–15.0)
Immature Granulocytes: 0 %
Lymphocytes Relative: 20 %
Lymphs Abs: 1.4 10*3/uL (ref 0.7–4.0)
MCH: 28.9 pg (ref 26.0–34.0)
MCHC: 33.5 g/dL (ref 30.0–36.0)
MCV: 86.3 fL (ref 80.0–100.0)
Monocytes Absolute: 0.5 10*3/uL (ref 0.1–1.0)
Monocytes Relative: 7 %
Neutro Abs: 4.5 10*3/uL (ref 1.7–7.7)
Neutrophils Relative %: 66 %
Platelet Count: 230 10*3/uL (ref 150–400)
RBC: 4.46 MIL/uL (ref 3.87–5.11)
RDW: 16.2 % — ABNORMAL HIGH (ref 11.5–15.5)
WBC Count: 6.8 10*3/uL (ref 4.0–10.5)
nRBC: 0 % (ref 0.0–0.2)

## 2022-01-19 LAB — IRON AND IRON BINDING CAPACITY (CC-WL,HP ONLY)
Iron: 78 ug/dL (ref 28–170)
Saturation Ratios: 27 % (ref 10.4–31.8)
TIBC: 287 ug/dL (ref 250–450)
UIBC: 209 ug/dL (ref 148–442)

## 2022-01-19 LAB — FERRITIN: Ferritin: 144 ng/mL (ref 11–307)

## 2022-01-20 NOTE — Progress Notes (Signed)
 Patient Care Team: Ehinger, Robert, MD as PCP - General (Family Medicine) Skains, Mark C, MD as PCP - Cardiology (Cardiology) Ganem, Salem F, MD as Consulting Physician (Gastroenterology) Ramirez, Armando, MD as Consulting Physician (Surgery) Gudena, Vinay, MD as Consulting Physician (Hematology and Oncology) Mackey, Heather Thompson, NP as Nurse Practitioner (Hematology and Oncology) Toth, Paul III, MD as Consulting Physician (General Surgery) Wentworth, Stacy, MD as Consulting Physician (Radiation Oncology)  DIAGNOSIS: No diagnosis found.  SUMMARY OF ONCOLOGIC HISTORY: Oncology History  Breast cancer of upper-outer quadrant of right female breast ([facility])  07/30/2014 Mammogram   Right breast: Mass at 10 o'clock location 4 cm from the nipple measuring 1.7 x 1.3 x 1.3 cm. This corresponds to the palpable and mammographic finding. In the right axilla, there are several lymph nodes which are normal in size measuring up to 4mm.   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%), PR- (0%), HER2/neu negative, Ki67 30%   08/27/2014 Clinical Stage   Stage IIA: T2 N0   09/22/2014 Definitive Surgery   Right lumpectomy/SLNB (Toth): Invasive ductal carcinoma, grade 3, 2.6 cm, high grade DCIS, LVI present, ER-, PR-, 2 LN removed and 1 positive for metastatic carcinoma (1/2)   09/22/2014 Pathologic Stage   Stage IIB: T2 N1 M0   10/15/2014 - 12/16/2014 Chemotherapy   Adjuvant chemotherapy with Taxotere Cytoxan 4 q3 weeks   10/21/2014 - 10/24/2014 Hospital Admission   Admission for neutropenic fever UTI Klebsiella   01/26/2015 - 03/11/2015 Radiation Therapy   Adjuvant RT ([facility]): Right breast / 45 Gray @ 1.8 Gray per fraction x 25 fractions Right supraclavicular fossa/PAB  45 Gy @1.8 Gy per fraction x 25 fractions Right breast boost / 16 Gray at 2 Gray per fraction x 8 fractions   05/19/2015 Survivorship   Survivorship care plan visit  completed.   03/28/2018 Surgery   Laparoscopic cholecystectomy     CHIEF COMPLIANT:   INTERVAL HISTORY: Angela Bond is a 76-year-old above-mentioned history of breast cancer who was started on clinical trial for peripheral neuropathy in July. She presents to the clinic today for a follow-up.   ALLERGIES:  is allergic to other, oyster shell, shellfish allergy, aspirin, penicillins, fosamax [alendronate], brilinta [ticagrelor], ciprofloxacin, colesevelam, doxycycline, evista [raloxifene], fosamax [alendronate sodium], lactose intolerance (gi), latex, naproxen, naproxen sodium, nitrofurantoin, pravachol [pravastatin], sulfa antibiotics, tape, welchol [colesevelam hcl], and zocor [simvastatin].  MEDICATIONS:  Current Outpatient Medications  Medication Sig Dispense Refill   acetaminophen (TYLENOL) 500 MG tablet Take 1 tablet (500 mg total) by mouth every 6 (six) hours as needed for moderate pain or headache. 30 tablet 0   ALPRAZolam (XANAX) 0.25 MG tablet Take 0.25 mg by mouth 2 (two) times daily as needed.     aspirin EC 81 MG EC tablet Take 1 tablet (81 mg total) by mouth daily.     calcium carbonate (OSCAL) 1500 (600 Ca) MG TABS tablet Take by mouth.     Calcium Carbonate-Vit D-Min (CALCIUM 1200 PO) Take by mouth.     Cholecalciferol (VITAMIN D) 2000 UNITS tablet Take 2,000 Units by mouth 2 (two) times daily.      dexlansoprazole (DEXILANT) 60 MG capsule Take 60 mg by mouth daily.     escitalopram (LEXAPRO) 20 MG tablet Take 1 tablet (20 mg total) by mouth daily. 90 tablet 3   ezetimibe (ZETIA) 10 MG tablet Take 1 tablet (10 mg total) by mouth at bedtime. 90 tablet   3   famotidine (PEPCID) 20 MG tablet Take 20 mg by mouth at bedtime.      fexofenadine (ALLEGRA) 180 MG tablet Take 180 mg by mouth daily as needed for allergies.      furosemide (LASIX) 20 MG tablet Take one tablet daily as needed for 3 lbs over night weight gain 30 tablet 3   gabapentin (NEURONTIN) 600 MG tablet Take 1  tablet (600 mg total) by mouth 3 (three) times daily. 90 tablet 0   lovastatin (MEVACOR) 40 MG tablet TAKE 1 TABLET BY MOUTH AT  BEDTIME 90 tablet 2   Magnesium 500 MG TABS Take 500 mg by mouth at bedtime.     meclizine (ANTIVERT) 25 MG tablet Take 25 mg by mouth 3 (three) times daily as needed for dizziness.      montelukast (SINGULAIR) 10 MG tablet Take 10 mg by mouth at bedtime.     nitroGLYCERIN (NITROSTAT) 0.4 MG SL tablet Place 1 tablet (0.4 mg total) under the tongue every 5 (five) minutes as needed for chest pain (CP or SOB). 25 tablet 6   nystatin cream (MYCOSTATIN) Apply 1 Application topically 2 (two) times daily. 30 g 4   ondansetron (ZOFRAN-ODT) 4 MG disintegrating tablet Take 1 tablet (4 mg total) by mouth every 8 (eight) hours as needed for nausea or vomiting. 20 tablet 0   Polyvinyl Alcohol-Povidone PF (REFRESH) 1.4-0.6 % SOLN Place 2 drops into both eyes 3 (three) times daily as needed (for dry eyes).     potassium chloride (K-DUR) 10 MEQ tablet Take 10 mEq by mouth every 3 (three) days.     triamcinolone cream (KENALOG) 0.1 % Apply 1 application topically 2 (two) times daily. 30 g 4   vitamin C (ASCORBIC ACID) 500 MG tablet Take 500 mg by mouth daily.     Zinc Acetate, Oral, (ZINC ACETATE PO) Take by mouth.     No current facility-administered medications for this visit.    PHYSICAL EXAMINATION: ECOG PERFORMANCE STATUS: {CHL ONC ECOG PS:[id number]}  There were no vitals filed for this visit. There were no vitals filed for this visit.  BREAST:*** No palpable masses or nodules in either right or left breasts. No palpable axillary supraclavicular or infraclavicular adenopathy no breast tenderness or nipple discharge. (exam performed in the presence of a chaperone)  LABORATORY DATA:  I have reviewed the data as listed    Latest Ref Rng & Units 01/19/2022   10:33 AM 12/08/2021   10:06 AM 11/18/2021    6:19 PM  CMP  Glucose 70 - 99 mg/dL 103  89    BUN 8 - 23 mg/dL 8  10     Creatinine 0.44 - 1.00 mg/dL 0.67  0.65    Sodium 135 - 145 mmol/L 140  139    Potassium 3.5 - 5.1 mmol/L 3.7  3.8    Chloride 98 - 111 mmol/L 101  102    CO2 22 - 32 mmol/L 32  33    Calcium 8.9 - 10.3 mg/dL 9.3  8.9    Total Protein 6.5 - 8.1 g/dL 7.7  7.6  8.5   Total Bilirubin 0.3 - 1.2 mg/dL 0.5  0.4  0.5   Alkaline Phos 38 - 126 U/L 69  71  78   AST 15 - 41 U/L 14  15  22   ALT 0 - 44 U/L 9  10  14     Lab Results  Component Value Date   WBC 6.8   01/19/2022   HGB 12.9 01/19/2022   HCT 38.5 01/19/2022   MCV 86.3 01/19/2022   PLT 230 01/19/2022   NEUTROABS 4.5 01/19/2022    ASSESSMENT & PLAN:  No problem-specific Assessment & Plan notes found for this encounter.    No orders of the defined types were placed in this encounter.  The patient has a good understanding of the overall plan. she agrees with it. she will call with any problems that may develop before the next visit here. Total time spent: 30 mins including face to face time and time spent for planning, charting and co-ordination of care   Deritra L Mcnairy, CMA 01/20/22    I Deritra, Mcnairy am scribing for Dr. Gudena  ***  

## 2022-01-21 ENCOUNTER — Inpatient Hospital Stay: Payer: Medicare Other | Admitting: Hematology and Oncology

## 2022-01-24 ENCOUNTER — Inpatient Hospital Stay (HOSPITAL_BASED_OUTPATIENT_CLINIC_OR_DEPARTMENT_OTHER): Payer: Medicare Other | Admitting: Hematology and Oncology

## 2022-01-24 ENCOUNTER — Other Ambulatory Visit: Payer: Self-pay

## 2022-01-24 DIAGNOSIS — Z171 Estrogen receptor negative status [ER-]: Secondary | ICD-10-CM | POA: Diagnosis not present

## 2022-01-24 DIAGNOSIS — K909 Intestinal malabsorption, unspecified: Secondary | ICD-10-CM | POA: Diagnosis not present

## 2022-01-24 DIAGNOSIS — C50411 Malignant neoplasm of upper-outer quadrant of right female breast: Secondary | ICD-10-CM | POA: Diagnosis not present

## 2022-01-24 DIAGNOSIS — D508 Other iron deficiency anemias: Secondary | ICD-10-CM | POA: Diagnosis not present

## 2022-01-24 DIAGNOSIS — Z853 Personal history of malignant neoplasm of breast: Secondary | ICD-10-CM | POA: Diagnosis not present

## 2022-01-24 DIAGNOSIS — D509 Iron deficiency anemia, unspecified: Secondary | ICD-10-CM | POA: Diagnosis not present

## 2022-01-24 DIAGNOSIS — Z79899 Other long term (current) drug therapy: Secondary | ICD-10-CM | POA: Diagnosis not present

## 2022-01-24 NOTE — Assessment & Plan Note (Addendum)
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  Lab review: 01/19/2022: Hemoglobin 12.9, ferritin 144, iron saturation 27%  Major depression: Was on Lexapro. Peripheral neuropathy: Slowly improving.  Recheck labs in 6 months and telephone visit 2 days later to discuss results

## 2022-01-24 NOTE — Assessment & Plan Note (Signed)
Diagnosed 2016 treated with lumpectomy, Taxotere/Cytoxan as well as radiation therapy, triple negative  Breast cancer surveillance: 1.Breast examon 12/08/2021: Benign no palpable lumps or nodules 2.Mammogram12/6/2022nbenign breast density category C 3.Bone density 03/19/2019: T score -2: Osteopenia continue with calcium vitamin D and weightbearing exercises

## 2022-01-24 NOTE — Progress Notes (Signed)
Office Visit    Patient Name: Angela Bond Date of Encounter: 01/27/2022  Primary Care Provider:  Gaynelle Arabian, MD Primary Cardiologist:  Candee Furbish, MD Primary Electrophysiologist: None  Chief Complaint    Angela Bond is a 76 y.o. female with PMH of CAD s/p NSTEMI treated with DES x1 to LAD 2017, HLD, HTN, GERD, right breast CA, orthostatic hypotension who presents today for follow-up of coronary artery disease.  Past Medical History    Past Medical History:  Diagnosis Date   Anemia    hx   Anxiety and depression    Aortic atherosclerosis ([place]) 07/31/2017   Arthritis    "~ all over my body; in the joints"   Breast cancer, right ([place]) 3/16   Coronary artery disease    a.  s/p NSTEMI in June 2017 with DES to mid LAD.   Depression    Gallstones    GERD (gastroesophageal reflux disease)    H/O hiatal hernia    Headache    Heart murmur    History of blood transfusion    "when taking chemo & w/one of my knee replacements" (11/05/2015)   Hypercholesteremia    Hypertension    Iron deficiency anemia 02/16/2017   Multiple bruises    Myocardial infarction (HCC)    Orthostatic hypotension    Personal history of chemotherapy    Personal history of radiation therapy    Pneumonia X 1   Shingles 5/15   hx   Sliding hiatal hernia s/p lap PEH repair 07/31/2012   Unstable angina (HCC) 01/23/2012   Unsteady gait    " I fall easily"   Wears dentures    partial   Wears glasses    Past Surgical History:  Procedure Laterality Date   BREAST BIOPSY Right    BREAST LUMPECTOMY Right 08/2014   BREAST LUMPECTOMY WITH SENTINEL LYMPH NODE BIOPSY Right 09/22/2014   Procedure: RIGHT BREAST LUMPECTOMY WITH NEEDLE LOCALIZATION AND RIGHT AXILLARY SENTINEL LYMPH NODE BIOPSY;  Surgeon: [name], MD;  Location: [facility];  Service: General;  Laterality: Right;   CARDIAC CATHETERIZATION     CARDIAC CATHETERIZATION N/A 11/05/2015   Procedure: Left Heart Cath and Coronary Angiography;   Surgeon: Belva Crome, MD;  Location: [facility];  Service: Cardiovascular;  Laterality: N/A;   CARDIAC CATHETERIZATION N/A 11/05/2015   Procedure: Coronary Stent Intervention;  Surgeon: Belva Crome, MD;  Location: [facility];  Service: Cardiovascular;  Laterality: N/A;   CARPAL TUNNEL RELEASE Right 09/2015   CHOLECYSTECTOMY N/A 03/28/2018   Procedure: LAPAROSCOPIC CHOLECYSTECTOMY WITH INTRAOPERATIVE CHOLANGIOGRAM ERAS PATHWAY;  Surgeon: Jovita Kussmaul, MD;  Location: [facility];  Service: General;  Laterality: N/A;   CORONARY ANGIOPLASTY WITH STENT PLACEMENT  11/05/2015   "1 stent"   ESOPHAGEAL MANOMETRY N/A 07/02/2012   Procedure: ESOPHAGEAL MANOMETRY (EM);  Surgeon: Lear Ng, MD;  Location: [facility];  Service: Endoscopy;  Laterality: N/A;  schooler to read   EYE SURGERY Left 2013   tube placed tear duct   HIATAL HERNIA REPAIR N/A 08/10/2012   Procedure: LAPAROSCOPIC REPAIR OF HIATAL HERNIA WITH MESH AND EGD WITH PEG TUBE PLACEMENT;  Surgeon: Ralene Ok, MD;  Location: [facility];  Service: General;  Laterality: N/A;   LEFT HEART CATH AND CORONARY ANGIOGRAPHY N/A 08/01/2017   Procedure: LEFT HEART CATH AND CORONARY ANGIOGRAPHY;  Surgeon: Jettie Booze, MD;  Location: [facility];  Service: Cardiovascular;  Laterality: N/A;  ORIF ELBOW FRACTURE Right 12/25/2018   ORIF HUMERUS FRACTURE Right 12/25/2018   Procedure: OPEN REDUCTION INTERNAL FIXATION (ORIF) DISTAL HUMERUS FRACTURE;  Surgeon: Altamese Pal[place], MD;  Location: [facility];  Service: Orthopedics;  Laterality: Right;   PORT-A-CATH REMOVAL  06/2015   PORTACATH PLACEMENT Left 09/22/2014   Procedure: INSERTION PORT-A-CATH;  Surgeon: [name], MD;  Location: [facility];  Service: General;  Laterality: Left;   POSTERIOR LUMBAR FUSION  2016   "had 2 vertebrae fused"   TONSILLECTOMY  as child   TOTAL KNEE ARTHROPLASTY Bilateral 2011,2012    Allergies  Allergies  Allergen Reactions   Other Anaphylaxis,  Nausea And Vomiting and Other (See Comments)    THROAT CLOSE, CAN'T BREATH ANAPHYLAXIS TO OYSTERS   Oyster Shell Anaphylaxis   Shellfish Allergy Anaphylaxis and Other (See Comments)    ANAPHYLAXIS TO OYSTERS   Aspirin Other (See Comments)    Burns stomach   Penicillins Nausea And Vomiting and Other (See Comments)    PATIENT HAS HAD A PCN REACTION WITH IMMEDIATE RASH, FACIAL/TONGUE/THROAT SWELLING, SOB, OR LIGHTHEADEDNESS WITH HYPOTENSION:  #  #  YES  #  #  Has patient had a PCN reaction causing severe rash involving mucus membranes or skin necrosis: No Has patient had a PCN reaction that required hospitalization: No Has patient had a PCN reaction occurring within the last 10 years: No If all of the above answers are "NO", then may proceed with Cephalosporin use.    Fosamax [Alendronate] Other (See Comments)   Brilinta [Ticagrelor] Other (See Comments)    Indigestion   Ciprofloxacin Nausea And Vomiting and Other (See Comments)   Colesevelam Other (See Comments)    Stomach cramps   Doxycycline Nausea And Vomiting   Evista [Raloxifene] Other (See Comments)    LEG ACHES   Fosamax [Alendronate Sodium] Other (See Comments)    Leg aches   Lactose Intolerance (Gi) Nausea And Vomiting   Latex Rash   Naproxen Nausea And Vomiting and Other (See Comments)   Naproxen Sodium Nausea And Vomiting   Nitrofurantoin Rash and Other (See Comments)    "I broke out down there"   Pravachol [Pravastatin] Other (See Comments)    GI upset    Sulfa Antibiotics Nausea And Vomiting   Tape Rash   Welchol [Colesevelam Hcl] Other (See Comments)    Stomach cramps   Zocor [Simvastatin] Rash    History of Present Illness    Angela Bond has a PMH of is a 76 year old female with the above mention past medical history who presents today for follow-up of coronary artery disease.  She was initially seen by Dr. Marlou Porch in 2013 and had a cardiac catheterization that showed non obstructive disease with normal LV  function.  She she was then admitted 10/2015 with ACS and found to have NSTEMI with DES placed to mid LAD lesion x1.  She was then seen 01/15/2016 after developing hypotension during cardiac rehab session and later developed left-sided chest pain that radiated to neck and shoulders that was associated with SOB.  Initial troponins were negative.  She was provided medication adjustments and nuclear stress test was performed that showed low risk and no ischemia.  She was seen 07/2017 with complaints of chest tightness shortness of breath.  She received 2 nitroglycerin and 324 mg ASA troponins were negative and EKG was unremarkable.  LHC was performed 07/2017 revealed no new blockages and ICM that was jailed by stent at first diagonal.  Medical  management was recommended.  She was last seen by Dr. Marlou Porch on 03/2020 for follow-up.  She was reportedly doing well and taking medications with no side effects.  She had no reports of orthostatic hypotension or angina.  Ms. Kilgore presents today for her follow-up alone.  Since last being seen in the office patient reports she has been doing well with no new cardiac complaints.  She reports some shortness of breath when walking up inclines but reports no chest pain or palpitations.  She is tolerating her current medications without any side effects.  She was taken off of her lovastatin but is still taking her Zetia.  She is planning to follow-up with her PCP in the next few months and will have her cholesterol checked.  Patient denies chest pain, palpitations, dyspnea, PND, orthopnea, nausea, vomiting, dizziness, syncope, edema, weight gain, or early satiety.  Home Medications    Current Outpatient Medications  Medication Sig Dispense Refill   acetaminophen (TYLENOL) 500 MG tablet Take 1 tablet (500 mg total) by mouth every 6 (six) hours as needed for moderate pain or headache. 30 tablet 0   ALPRAZolam (XANAX) 0.25 MG tablet Take 0.25 mg by mouth 2 (two) times daily as  needed.     aspirin EC 81 MG EC tablet Take 1 tablet (81 mg total) by mouth daily.     calcium carbonate (OSCAL) 1500 (600 Ca) MG TABS tablet Take by mouth.     Calcium Carbonate-Vit D-Min (CALCIUM 1200 PO) Take by mouth.     Cholecalciferol (VITAMIN D) 2000 UNITS tablet Take 2,000 Units by mouth 2 (two) times daily.      dexlansoprazole (DEXILANT) 60 MG capsule Take 60 mg by mouth daily.     escitalopram (LEXAPRO) 20 MG tablet Take 1 tablet (20 mg total) by mouth daily. 90 tablet 3   ezetimibe (ZETIA) 10 MG tablet Take 1 tablet (10 mg total) by mouth at bedtime. 90 tablet 3   famotidine (PEPCID) 20 MG tablet Take 20 mg by mouth at bedtime.      fexofenadine (ALLEGRA) 180 MG tablet Take 180 mg by mouth daily as needed for allergies.      furosemide (LASIX) 20 MG tablet Take one tablet daily as needed for 3 lbs over night weight gain 30 tablet 3   gabapentin (NEURONTIN) 600 MG tablet Take 1 tablet (600 mg total) by mouth 3 (three) times daily. 90 tablet 0   Magnesium 500 MG TABS Take 500 mg by mouth at bedtime.     meclizine (ANTIVERT) 25 MG tablet Take 25 mg by mouth 3 (three) times daily as needed for dizziness.      montelukast (SINGULAIR) 10 MG tablet Take 10 mg by mouth at bedtime.     nitroGLYCERIN (NITROSTAT) 0.4 MG SL tablet Place 1 tablet (0.4 mg total) under the tongue every 5 (five) minutes as needed for chest pain (CP or SOB). 25 tablet 6   nystatin cream (MYCOSTATIN) Apply 1 Application topically 2 (two) times daily. 30 g 4   ondansetron (ZOFRAN-ODT) 4 MG disintegrating tablet Take 1 tablet (4 mg total) by mouth every 8 (eight) hours as needed for nausea or vomiting. 20 tablet 0   Polyvinyl Alcohol-Povidone PF (REFRESH) 1.4-0.6 % SOLN Place 2 drops into both eyes 3 (three) times daily as needed (for dry eyes).     potassium chloride (K-DUR) 10 MEQ tablet Take 10 mEq by mouth every 3 (three) days.     triamcinolone cream (KENALOG) 0.1 %  Apply 1 application topically 2 (two) times  daily. 30 g 4   vitamin C (ASCORBIC ACID) 500 MG tablet Take 500 mg by mouth daily.     Zinc Acetate, Oral, (ZINC ACETATE PO) Take by mouth.     No current facility-administered medications for this visit.     Review of Systems  Please see the history of present illness.     All other systems reviewed and are otherwise negative except as noted above.  Physical Exam    Wt Readings from Last 3 Encounters:  01/27/22 154 lb (69.9 kg)  01/24/22 153 lb 9.6 oz (69.7 kg)  12/08/21 153 lb 9.6 oz (69.7 kg)   VS: Vitals:   01/27/22 1114  BP: 128/84  Pulse: 72  SpO2: 98%  ,Body mass index is 34.53 kg/m.  Constitutional:      Appearance: Healthy appearance. Not in distress.  Neck:     Vascular: JVD normal.  Pulmonary:     Effort: Pulmonary effort is normal.     Breath sounds: No wheezing. No rales. Diminished in the bases Cardiovascular:     Normal rate. Regular rhythm. Normal S1. Normal S2.      Murmurs: There is no murmur.  Edema:    Peripheral edema absent.  Abdominal:     Palpations: Abdomen is soft non tender. There is no hepatomegaly.  Skin:    General: Skin is warm and dry.  Neurological:     General: No focal deficit present.     Mental Status: Alert and oriented to person, place and time.     Cranial Nerves: Cranial nerves are intact.  EKG/LABS/Other Studies Reviewed    ECG personally reviewed by me today -none completed today   Lab Results  Component Value Date   WBC 6.8 01/19/2022   HGB 12.9 01/19/2022   HCT 38.5 01/19/2022   MCV 86.3 01/19/2022   PLT 230 01/19/2022   Lab Results  Component Value Date   CREATININE 0.67 01/19/2022   BUN 8 01/19/2022   NA 140 01/19/2022   K 3.7 01/19/2022   CL 101 01/19/2022   CO2 32 01/19/2022   Lab Results  Component Value Date   ALT 9 01/19/2022   AST 14 (L) 01/19/2022   ALKPHOS 69 01/19/2022   BILITOT 0.5 01/19/2022   Lab Results  Component Value Date   CHOL 122 03/26/2019   HDL 66 03/26/2019   LDLCALC  42 03/26/2019   TRIG 66 03/26/2019   CHOLHDL 1.8 03/26/2019    Lab Results  Component Value Date   HGBA1C 5.5 11/05/2015    Assessment & Plan    1.  Coronary artery disease: -GDMT consist of ASA 81 mg Zetia 10 mg Mevacor 40 mg  2.  Orthostatic hypotension: -Blood pressure today was well controlled at 128/84 -Patient reports no orthostasis in the last 2 years  3.  Angina: -Today patient reports no chest pain or discomfort since being seen in clinic in 2021  4.  Hyperlipidemia: -Patient is planned to have cholesterol checked by PCP. -She is currently on Zetia 10 mg only and was taken off of her lovastatin.  Disposition: Follow-up with Candee Furbish, MD or APP in 12 months     Medication Adjustments/Labs and Tests Ordered: Current medicines are reviewed at length with the patient today.  Concerns regarding medicines are outlined above.   Signed, Mable Fill, Marissa Nestle, NP 01/27/2022, 11:33 AM [place]

## 2022-01-27 ENCOUNTER — Encounter: Payer: Self-pay | Admitting: Nurse Practitioner

## 2022-01-27 ENCOUNTER — Ambulatory Visit: Payer: Medicare Other | Attending: Nurse Practitioner | Admitting: Nurse Practitioner

## 2022-01-27 VITALS — BP 128/84 | HR 72 | Ht <= 58 in | Wt 154.0 lb

## 2022-01-27 DIAGNOSIS — I2510 Atherosclerotic heart disease of native coronary artery without angina pectoris: Secondary | ICD-10-CM | POA: Diagnosis not present

## 2022-01-27 DIAGNOSIS — I951 Orthostatic hypotension: Secondary | ICD-10-CM

## 2022-01-27 DIAGNOSIS — I209 Angina pectoris, unspecified: Secondary | ICD-10-CM

## 2022-01-27 DIAGNOSIS — E7800 Pure hypercholesterolemia, unspecified: Secondary | ICD-10-CM

## 2022-01-27 NOTE — Patient Instructions (Signed)
Medication Instructions:  Your physician recommends that you continue on your current medications as directed. Please refer to the Current Medication list given to you today.  *If you need a refill on your cardiac medications before your next appointment, please call your pharmacy*  Follow-Up: At [facility], you and your health needs are our priority.  As part of our continuing mission to provide you with exceptional heart care, we have created designated Provider Care Teams.  These Care Teams include your primary Cardiologist (physician) and Advanced Practice Providers (APPs -  Physician Assistants and Nurse Practitioners) who all work together to provide you with the care you need, when you need it.  We recommend signing up for the patient portal called "MyChart".  Sign up information is provided on this After Visit Summary.  MyChart is used to connect with patients for Virtual Visits (Telemedicine).  Patients are able to view lab/test results, encounter notes, upcoming appointments, etc.  Non-urgent messages can be sent to your provider as well.   To learn more about what you can do with MyChart, go to [email or web address].    Your next appointment:   12 month(s)  The format for your next appointment:   In Person  Provider:   Candee Furbish, MD

## 2022-02-04 DIAGNOSIS — H2513 Age-related nuclear cataract, bilateral: Secondary | ICD-10-CM | POA: Diagnosis not present

## 2022-02-04 DIAGNOSIS — H5203 Hypermetropia, bilateral: Secondary | ICD-10-CM | POA: Diagnosis not present

## 2022-02-22 DIAGNOSIS — I209 Angina pectoris, unspecified: Secondary | ICD-10-CM | POA: Diagnosis not present

## 2022-02-22 DIAGNOSIS — Z0000 Encounter for general adult medical examination without abnormal findings: Secondary | ICD-10-CM | POA: Diagnosis not present

## 2022-02-22 DIAGNOSIS — Z853 Personal history of malignant neoplasm of breast: Secondary | ICD-10-CM | POA: Diagnosis not present

## 2022-02-22 DIAGNOSIS — I700 Atherosclerosis of aorta: Secondary | ICD-10-CM | POA: Diagnosis not present

## 2022-02-22 DIAGNOSIS — I25119 Atherosclerotic heart disease of native coronary artery with unspecified angina pectoris: Secondary | ICD-10-CM | POA: Diagnosis not present

## 2022-02-22 DIAGNOSIS — E7800 Pure hypercholesterolemia, unspecified: Secondary | ICD-10-CM | POA: Diagnosis not present

## 2022-02-22 DIAGNOSIS — R42 Dizziness and giddiness: Secondary | ICD-10-CM | POA: Diagnosis not present

## 2022-02-22 DIAGNOSIS — R7309 Other abnormal glucose: Secondary | ICD-10-CM | POA: Diagnosis not present

## 2022-02-22 DIAGNOSIS — G629 Polyneuropathy, unspecified: Secondary | ICD-10-CM | POA: Diagnosis not present

## 2022-02-22 DIAGNOSIS — Z23 Encounter for immunization: Secondary | ICD-10-CM | POA: Diagnosis not present

## 2022-02-22 DIAGNOSIS — I10 Essential (primary) hypertension: Secondary | ICD-10-CM | POA: Diagnosis not present

## 2022-03-15 ENCOUNTER — Other Ambulatory Visit: Payer: Self-pay | Admitting: Family Medicine

## 2022-03-15 DIAGNOSIS — Z1231 Encounter for screening mammogram for malignant neoplasm of breast: Secondary | ICD-10-CM

## 2022-03-16 ENCOUNTER — Ambulatory Visit
Admission: RE | Admit: 2022-03-16 | Discharge: 2022-03-16 | Disposition: A | Discharge status: Home / Self Care | Payer: Medicare Other | Source: Ambulatory Visit | Attending: Physician Assistant | Admitting: Physician Assistant

## 2022-03-16 ENCOUNTER — Other Ambulatory Visit: Payer: Self-pay | Admitting: Physician Assistant

## 2022-03-16 DIAGNOSIS — R197 Diarrhea, unspecified: Secondary | ICD-10-CM | POA: Diagnosis not present

## 2022-03-16 DIAGNOSIS — R1310 Dysphagia, unspecified: Secondary | ICD-10-CM

## 2022-03-16 DIAGNOSIS — K5900 Constipation, unspecified: Secondary | ICD-10-CM

## 2022-03-16 DIAGNOSIS — K219 Gastro-esophageal reflux disease without esophagitis: Secondary | ICD-10-CM | POA: Diagnosis not present

## 2022-03-18 ENCOUNTER — Ambulatory Visit
Admission: RE | Admit: 2022-03-18 | Discharge: 2022-03-18 | Disposition: A | Discharge status: Home / Self Care | Payer: Medicare Other | Source: Ambulatory Visit | Attending: Physician Assistant | Admitting: Physician Assistant

## 2022-03-18 DIAGNOSIS — K219 Gastro-esophageal reflux disease without esophagitis: Secondary | ICD-10-CM | POA: Diagnosis not present

## 2022-03-18 DIAGNOSIS — R1310 Dysphagia, unspecified: Secondary | ICD-10-CM | POA: Diagnosis not present

## 2022-03-18 DIAGNOSIS — K224 Dyskinesia of esophagus: Secondary | ICD-10-CM | POA: Diagnosis not present

## 2022-05-06 DIAGNOSIS — J069 Acute upper respiratory infection, unspecified: Secondary | ICD-10-CM | POA: Diagnosis not present

## 2022-05-06 DIAGNOSIS — S0992XA Unspecified injury of nose, initial encounter: Secondary | ICD-10-CM | POA: Diagnosis not present

## 2022-05-13 ENCOUNTER — Ambulatory Visit
Admission: RE | Admit: 2022-05-13 | Discharge: 2022-05-13 | Disposition: A | Discharge status: Home / Self Care | Payer: Medicare Other | Source: Ambulatory Visit | Attending: Family Medicine | Admitting: Family Medicine

## 2022-05-13 DIAGNOSIS — Z1231 Encounter for screening mammogram for malignant neoplasm of breast: Secondary | ICD-10-CM | POA: Diagnosis not present

## 2022-07-18 ENCOUNTER — Encounter: Payer: Self-pay | Admitting: Hematology and Oncology

## 2022-07-25 ENCOUNTER — Inpatient Hospital Stay: Payer: Medicare Other | Attending: Hematology and Oncology

## 2022-07-25 DIAGNOSIS — C50411 Malignant neoplasm of upper-outer quadrant of right female breast: Secondary | ICD-10-CM | POA: Insufficient documentation

## 2022-07-25 DIAGNOSIS — Z171 Estrogen receptor negative status [ER-]: Secondary | ICD-10-CM | POA: Insufficient documentation

## 2022-07-27 ENCOUNTER — Encounter: Payer: Self-pay | Admitting: *Deleted

## 2022-07-27 ENCOUNTER — Inpatient Hospital Stay: Payer: Medicare Other | Admitting: Hematology and Oncology

## 2022-07-27 NOTE — Progress Notes (Signed)
Per MD pt appt for today needs to be reschedule. Pt was a no show for labs on Monday and lab work is needed to complete appt.  RN sent HP message to scheduling to reschedule for lab and MD visit 2 days after.

## 2022-07-27 NOTE — Assessment & Plan Note (Deleted)
Diagnosed 2016 treated with lumpectomy, Taxotere/Cytoxan as well as radiation therapy, triple negative   Breast cancer surveillance: 1.  Breast exam on 12/08/2021: Benign no palpable lumps or nodules 2.  Mammogram 04/20/2021 n benign breast density category C  3.  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   Lab review: 01/19/2022: Hemoglobin 12.9, ferritin 144, iron saturation 27%   Major depression: Was on Lexapro. Peripheral neuropathy: Slowly improving.   Recheck labs in 6 months and telephone visit 2 days later to discuss results

## 2022-08-01 DIAGNOSIS — R1084 Generalized abdominal pain: Secondary | ICD-10-CM | POA: Diagnosis not present

## 2022-08-01 DIAGNOSIS — R197 Diarrhea, unspecified: Secondary | ICD-10-CM | POA: Diagnosis not present

## 2022-08-02 ENCOUNTER — Telehealth: Payer: Self-pay | Admitting: Hematology and Oncology

## 2022-08-02 NOTE — Telephone Encounter (Signed)
Per 3/19 IB reached out to patient to schedule.

## 2022-08-02 NOTE — Telephone Encounter (Signed)
Per 3/18 IB reached out to patient to reschedule; voicemail full will try again at a later time.

## 2022-08-03 ENCOUNTER — Other Ambulatory Visit: Payer: Self-pay

## 2022-08-03 ENCOUNTER — Telehealth: Payer: Self-pay | Admitting: *Deleted

## 2022-08-03 ENCOUNTER — Inpatient Hospital Stay: Payer: Medicare Other

## 2022-08-03 DIAGNOSIS — C50411 Malignant neoplasm of upper-outer quadrant of right female breast: Secondary | ICD-10-CM | POA: Diagnosis not present

## 2022-08-03 DIAGNOSIS — D509 Iron deficiency anemia, unspecified: Secondary | ICD-10-CM

## 2022-08-03 DIAGNOSIS — Z171 Estrogen receptor negative status [ER-]: Secondary | ICD-10-CM | POA: Diagnosis not present

## 2022-08-03 LAB — IRON AND IRON BINDING CAPACITY (CC-WL,HP ONLY)
Iron: 83 ug/dL (ref 28–170)
Saturation Ratios: 23 % (ref 10.4–31.8)
TIBC: 365 ug/dL (ref 250–450)
UIBC: 282 ug/dL (ref 148–442)

## 2022-08-03 LAB — CBC WITH DIFFERENTIAL (CANCER CENTER ONLY)
Abs Immature Granulocytes: 0.02 10*3/uL (ref 0.00–0.07)
Basophils Absolute: 0 10*3/uL (ref 0.0–0.1)
Basophils Relative: 1 %
Eosinophils Absolute: 0.4 10*3/uL (ref 0.0–0.5)
Eosinophils Relative: 6 %
HCT: 39.1 % (ref 36.0–46.0)
Hemoglobin: 13.2 g/dL (ref 12.0–15.0)
Immature Granulocytes: 0 %
Lymphocytes Relative: 24 %
Lymphs Abs: 1.6 10*3/uL (ref 0.7–4.0)
MCH: 30 pg (ref 26.0–34.0)
MCHC: 33.8 g/dL (ref 30.0–36.0)
MCV: 88.9 fL (ref 80.0–100.0)
Monocytes Absolute: 0.5 10*3/uL (ref 0.1–1.0)
Monocytes Relative: 7 %
Neutro Abs: 4.1 10*3/uL (ref 1.7–7.7)
Neutrophils Relative %: 62 %
Platelet Count: 215 10*3/uL (ref 150–400)
RBC: 4.4 MIL/uL (ref 3.87–5.11)
RDW: 13.2 % (ref 11.5–15.5)
WBC Count: 6.6 10*3/uL (ref 4.0–10.5)
nRBC: 0 % (ref 0.0–0.2)

## 2022-08-03 LAB — FERRITIN: Ferritin: 49 ng/mL (ref 11–307)

## 2022-08-03 NOTE — Telephone Encounter (Signed)
Received call from pt stating she is experiencing diarrhea and is currently being followed by Dr. Danton Clap with [facility].  Pt states she wanted to alert our office of provider information for our records. MD added to pt care team.

## 2022-08-10 NOTE — Progress Notes (Signed)
HEMATOLOGY-ONCOLOGY TELEPHONE VISIT PROGRESS NOTE  I connected with our patient on 08/16/22 at 10:00 AM EDT by telephone and verified that I am speaking with the correct person using two identifiers.  I discussed the limitations, risks, security and privacy concerns of performing an evaluation and management service by telephone and the availability of in person appointments.  I also discussed with the patient that there may be a patient responsible charge related to this service. The patient expressed understanding and agreed to proceed.   History of Present Illness:  Angela Bond is a 77 year old above-mentioned history of breast cancer who was started on clinical trial for peripheral neuropathy in July. She presents to the clinic today for a telephone follow-up to discuss labs.  She had a recent upper respiratory infection and she appears to be improving with time.  She no longer has any fevers.  Oncology History  Breast cancer of upper-outer quadrant of right female breast  07/30/2014 Mammogram   Right breast: Mass at 10 o'clock location 4 cm from the nipple measuring 1.7 x 1.3 x 1.3 cm. This corresponds to the palpable and mammographic finding. In the right axilla, there are several lymph nodes which are normal in size measuring up to 7mm.   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%), PR- (0%), HER2/neu negative, Ki67 30%   08/27/2014 Clinical Stage   Stage IIA: T2 N0   09/22/2014 Definitive Surgery   Right lumpectomy/[facility] Marlou Starks): Invasive ductal carcinoma, grade 3, 2.6 cm, high grade DCIS, LVI present, ER-, PR-, 2 LN removed and 1 positive for metastatic carcinoma (1/2)   09/22/2014 Pathologic Stage   Stage IIB: T2 N1 M0   10/15/2014 - 12/16/2014 Chemotherapy   Adjuvant chemotherapy with Taxotere Cytoxan 4 q3 weeks   10/21/2014 - 10/24/2014 Hospital Admission   Admission for neutropenic fever UTI Klebsiella   01/26/2015 -  03/11/2015 Radiation Therapy   Adjuvant RT Pablo Ledger): Right breast / 45 Gray @ 1.8 [place] Cables per fraction x 25 fractions Right supraclavicular fossa/PAB  45 Gy @1 .8 Gy per fraction x 25 fractions Right breast boost / 16 Gray at [employer] per fraction x 8 fractions   05/19/2015 Survivorship   Survivorship care plan visit completed.   03/28/2018 Surgery   Laparoscopic cholecystectomy     REVIEW OF SYSTEMS:   Constitutional: Denies fevers, chills or abnormal weight loss All other systems were reviewed with the patient and are negative. Observations/Objective:     Assessment Plan:  Breast cancer of upper-outer quadrant of right female breast ([place]) Diagnosed 2016 treated with lumpectomy, Taxotere/Cytoxan as well as radiation therapy, triple negative   Breast cancer surveillance: 1.  Breast exam on 12/08/2021: Benign no palpable lumps or nodules 2.  Mammogram 04/20/2021 n benign breast density category C  3.  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   Lab review: 01/19/2022: Hemoglobin 12.9, ferritin 144, iron saturation 27% 08/03/2022: Hemoglobin 13.2, MCV 88.9, iron saturation 23%, ferritin 49 (no need of IV iron)  major depression: Was on Lexapro. Peripheral neuropathy: Slowly improving.   Recheck labs in 6 months and follow-up with me 2 days later    I discussed the assessment and treatment plan with the patient. The patient was  provided an opportunity to ask questions and all were answered. The patient agreed with the plan and demonstrated an understanding of the instructions. The patient was advised to call back or seek an in-person evaluation if the symptoms worsen or if the condition fails to improve as anticipated.   I provided 12  minutes of non-face-to-face time during this encounter.  This includes time for charting and coordination of care   Harriette Ohara, MD  I Gardiner Coins am acting as a scribe for Dr.Vinay Gudena  I have reviewed the above documentation for accuracy and completeness, and I agree with the above.

## 2022-08-16 ENCOUNTER — Inpatient Hospital Stay: Payer: Medicare Other | Attending: Hematology and Oncology | Admitting: Hematology and Oncology

## 2022-08-16 DIAGNOSIS — C50411 Malignant neoplasm of upper-outer quadrant of right female breast: Secondary | ICD-10-CM | POA: Diagnosis not present

## 2022-08-16 DIAGNOSIS — Z171 Estrogen receptor negative status [ER-]: Secondary | ICD-10-CM

## 2022-08-16 DIAGNOSIS — D509 Iron deficiency anemia, unspecified: Secondary | ICD-10-CM | POA: Diagnosis not present

## 2022-08-16 NOTE — Assessment & Plan Note (Signed)
Diagnosed 2016 treated with lumpectomy, Taxotere/Cytoxan as well as radiation therapy, triple negative   Breast cancer surveillance: 1.  Breast exam on 12/08/2021: Benign no palpable lumps or nodules 2.  Mammogram 04/20/2021 n benign breast density category C  3.  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   Lab review: 01/19/2022: Hemoglobin 12.9, ferritin 144, iron saturation 27% 08/03/2022: Hemoglobin 13.2, MCV 88.9, iron saturation 23%, ferritin 49 (no need of IV iron)  major depression: Was on Lexapro. Peripheral neuropathy: Slowly improving.   Recheck labs in 6 months and follow-up with me 2 days later

## 2022-08-17 ENCOUNTER — Telehealth: Payer: Self-pay | Admitting: Hematology and Oncology

## 2022-08-17 NOTE — Telephone Encounter (Signed)
Scheduled appointments per 4/2 los. Patient is aware of the made appointments.

## 2022-08-25 DIAGNOSIS — E7800 Pure hypercholesterolemia, unspecified: Secondary | ICD-10-CM | POA: Diagnosis not present

## 2022-08-25 DIAGNOSIS — I10 Essential (primary) hypertension: Secondary | ICD-10-CM | POA: Diagnosis not present

## 2022-08-25 DIAGNOSIS — R42 Dizziness and giddiness: Secondary | ICD-10-CM | POA: Diagnosis not present

## 2022-08-25 DIAGNOSIS — R7309 Other abnormal glucose: Secondary | ICD-10-CM | POA: Diagnosis not present

## 2022-08-25 DIAGNOSIS — Z9181 History of falling: Secondary | ICD-10-CM | POA: Diagnosis not present

## 2022-08-25 DIAGNOSIS — E559 Vitamin D deficiency, unspecified: Secondary | ICD-10-CM | POA: Diagnosis not present

## 2022-08-25 DIAGNOSIS — Z853 Personal history of malignant neoplasm of breast: Secondary | ICD-10-CM | POA: Diagnosis not present

## 2022-08-25 DIAGNOSIS — M899 Disorder of bone, unspecified: Secondary | ICD-10-CM | POA: Diagnosis not present

## 2022-08-25 DIAGNOSIS — I25118 Atherosclerotic heart disease of native coronary artery with other forms of angina pectoris: Secondary | ICD-10-CM | POA: Diagnosis not present

## 2022-08-25 DIAGNOSIS — I700 Atherosclerosis of aorta: Secondary | ICD-10-CM | POA: Diagnosis not present

## 2022-08-25 DIAGNOSIS — G629 Polyneuropathy, unspecified: Secondary | ICD-10-CM | POA: Diagnosis not present

## 2022-09-20 DIAGNOSIS — K529 Noninfective gastroenteritis and colitis, unspecified: Secondary | ICD-10-CM | POA: Diagnosis not present

## 2022-09-21 DIAGNOSIS — K529 Noninfective gastroenteritis and colitis, unspecified: Secondary | ICD-10-CM | POA: Diagnosis not present

## 2022-11-08 DIAGNOSIS — K8681 Exocrine pancreatic insufficiency: Secondary | ICD-10-CM | POA: Diagnosis not present

## 2022-11-08 DIAGNOSIS — R194 Change in bowel habit: Secondary | ICD-10-CM | POA: Diagnosis not present

## 2022-11-22 DIAGNOSIS — L0390 Cellulitis, unspecified: Secondary | ICD-10-CM | POA: Diagnosis not present

## 2022-11-22 DIAGNOSIS — I10 Essential (primary) hypertension: Secondary | ICD-10-CM | POA: Diagnosis not present

## 2023-01-18 DIAGNOSIS — R531 Weakness: Secondary | ICD-10-CM | POA: Diagnosis not present

## 2023-01-18 DIAGNOSIS — R197 Diarrhea, unspecified: Secondary | ICD-10-CM | POA: Diagnosis not present

## 2023-01-18 DIAGNOSIS — I10 Essential (primary) hypertension: Secondary | ICD-10-CM | POA: Diagnosis not present

## 2023-01-18 DIAGNOSIS — I959 Hypotension, unspecified: Secondary | ICD-10-CM | POA: Diagnosis not present

## 2023-01-18 DIAGNOSIS — K8689 Other specified diseases of pancreas: Secondary | ICD-10-CM | POA: Diagnosis not present

## 2023-01-24 ENCOUNTER — Other Ambulatory Visit: Payer: Self-pay | Admitting: Family Medicine

## 2023-01-24 DIAGNOSIS — Z1231 Encounter for screening mammogram for malignant neoplasm of breast: Secondary | ICD-10-CM

## 2023-01-24 DIAGNOSIS — K8681 Exocrine pancreatic insufficiency: Secondary | ICD-10-CM | POA: Diagnosis not present

## 2023-01-25 DIAGNOSIS — K8689 Other specified diseases of pancreas: Secondary | ICD-10-CM | POA: Diagnosis not present

## 2023-02-16 ENCOUNTER — Inpatient Hospital Stay: Payer: Medicare Other | Attending: Hematology and Oncology

## 2023-02-16 DIAGNOSIS — Z923 Personal history of irradiation: Secondary | ICD-10-CM | POA: Insufficient documentation

## 2023-02-16 DIAGNOSIS — K909 Intestinal malabsorption, unspecified: Secondary | ICD-10-CM | POA: Insufficient documentation

## 2023-02-16 DIAGNOSIS — Z9221 Personal history of antineoplastic chemotherapy: Secondary | ICD-10-CM | POA: Diagnosis not present

## 2023-02-16 DIAGNOSIS — H5203 Hypermetropia, bilateral: Secondary | ICD-10-CM | POA: Diagnosis not present

## 2023-02-16 DIAGNOSIS — M8580 Other specified disorders of bone density and structure, unspecified site: Secondary | ICD-10-CM | POA: Diagnosis not present

## 2023-02-16 DIAGNOSIS — Z853 Personal history of malignant neoplasm of breast: Secondary | ICD-10-CM | POA: Diagnosis not present

## 2023-02-16 DIAGNOSIS — H2513 Age-related nuclear cataract, bilateral: Secondary | ICD-10-CM | POA: Diagnosis not present

## 2023-02-16 DIAGNOSIS — D509 Iron deficiency anemia, unspecified: Secondary | ICD-10-CM | POA: Insufficient documentation

## 2023-02-16 LAB — CBC WITH DIFFERENTIAL (CANCER CENTER ONLY)
Abs Immature Granulocytes: 0.01 10*3/uL (ref 0.00–0.07)
Basophils Absolute: 0 10*3/uL (ref 0.0–0.1)
Basophils Relative: 1 %
Eosinophils Absolute: 0.4 10*3/uL (ref 0.0–0.5)
Eosinophils Relative: 7 %
HCT: 37.5 % (ref 36.0–46.0)
Hemoglobin: 12 g/dL (ref 12.0–15.0)
Immature Granulocytes: 0 %
Lymphocytes Relative: 23 %
Lymphs Abs: 1.4 10*3/uL (ref 0.7–4.0)
MCH: 28.8 pg (ref 26.0–34.0)
MCHC: 32 g/dL (ref 30.0–36.0)
MCV: 90.1 fL (ref 80.0–100.0)
Monocytes Absolute: 0.4 10*3/uL (ref 0.1–1.0)
Monocytes Relative: 7 %
Neutro Abs: 3.8 10*3/uL (ref 1.7–7.7)
Neutrophils Relative %: 62 %
Platelet Count: 226 10*3/uL (ref 150–400)
RBC: 4.16 MIL/uL (ref 3.87–5.11)
RDW: 13.8 % (ref 11.5–15.5)
WBC Count: 6 10*3/uL (ref 4.0–10.5)
nRBC: 0 % (ref 0.0–0.2)

## 2023-02-16 LAB — CMP (CANCER CENTER ONLY)
ALT: 8 U/L (ref 0–44)
AST: 14 U/L — ABNORMAL LOW (ref 15–41)
Albumin: 3.7 g/dL (ref 3.5–5.0)
Alkaline Phosphatase: 74 U/L (ref 38–126)
Anion gap: 5 (ref 5–15)
BUN: 8 mg/dL (ref 8–23)
CO2: 33 mmol/L — ABNORMAL HIGH (ref 22–32)
Calcium: 9 mg/dL (ref 8.9–10.3)
Chloride: 101 mmol/L (ref 98–111)
Creatinine: 0.77 mg/dL (ref 0.44–1.00)
GFR, Estimated: >60 mL/min (ref >60)
Glucose, Bld: 99 mg/dL (ref 70–99)
Potassium: 3.9 mmol/L (ref 3.5–5.1)
Sodium: 139 mmol/L (ref 135–145)
Total Bilirubin: 0.6 mg/dL (ref 0.3–1.2)
Total Protein: 7.7 g/dL (ref 6.5–8.1)

## 2023-02-16 LAB — FERRITIN: Ferritin: 20 ng/mL (ref 11–307)

## 2023-02-16 LAB — IRON AND IRON BINDING CAPACITY (CC-WL,HP ONLY)
Iron: 80 ug/dL (ref 28–170)
Saturation Ratios: 19 % (ref 10.4–31.8)
TIBC: 412 ug/dL (ref 250–450)
UIBC: 332 ug/dL (ref 148–442)

## 2023-02-20 ENCOUNTER — Inpatient Hospital Stay (HOSPITAL_BASED_OUTPATIENT_CLINIC_OR_DEPARTMENT_OTHER): Payer: Medicare Other | Admitting: Hematology and Oncology

## 2023-02-20 DIAGNOSIS — D509 Iron deficiency anemia, unspecified: Secondary | ICD-10-CM

## 2023-02-20 DIAGNOSIS — Z171 Estrogen receptor negative status [ER-]: Secondary | ICD-10-CM | POA: Diagnosis not present

## 2023-02-20 DIAGNOSIS — C50411 Malignant neoplasm of upper-outer quadrant of right female breast: Secondary | ICD-10-CM | POA: Diagnosis not present

## 2023-02-20 NOTE — Assessment & Plan Note (Signed)
Diagnosed 2016 treated with lumpectomy, Taxotere/Cytoxan as well as radiation therapy, triple negative   Breast cancer surveillance: 1.  Breast exam on 12/08/2021: Benign no palpable lumps or nodules 2.  Mammogram 05/17/2022: Benign breast density category C  3.  Bone density 03/19/2019: T score -2: Osteopenia continue with calcium vitamin D and weightbearing exercises

## 2023-02-20 NOTE — Assessment & Plan Note (Addendum)
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   Lab review: 01/19/2022: Hemoglobin 12.9, ferritin 144, iron saturation 27% 08/03/2022: Hemoglobin 13.2, MCV 88.9, iron saturation 23%, ferritin 49 (no need of IV iron) 02/16/2023: Hemoglobin 12, MCV 90.1, iron saturation 19%, ferritin 20 (no need of IV iron)   major depression: Was on Lexapro. Peripheral neuropathy: Slowly improving.  Pancreatitis: Takes creon (having diarrhea)   Recheck labs in 4 months and follow-up with me 2 days later

## 2023-02-20 NOTE — Progress Notes (Signed)
HEMATOLOGY-ONCOLOGY TELEPHONE VISIT PROGRESS NOTE  I connected with our patient on 02/20/23 at  8:45 AM EDT by telephone and verified that I am speaking with the correct person using two identifiers.  I discussed the limitations, risks, security and privacy concerns of performing an evaluation and management service by telephone and the availability of in person appointments.  I also discussed with the patient that there may be a patient responsible charge related to this service. The patient expressed understanding and agreed to proceed.   History of Present Illness: Follow-up of iron deficiency anemia Discussed the use of AI scribe software for clinical note transcription with the patient, who gave verbal consent to proceed.  The patient, with a history of breast cancer and osteopenia, presents for follow-up of iron deficiency and newly diagnosed pancreatitis. She reports that her pancreas is not producing enough enzymes to digest food, leading to malabsorption and chronic diarrhea. To manage this, she has been prescribed Creon to take before meals, which has helped control her symptoms. However, she expresses concern about the cost of the medication, which is $100 per month, and has applied for a foundation to help cover the cost. She is worried she may run out of the medication before the foundation approves her application, which would result in a return of her diarrhea.  From a breast cancer standpoint, she is doing well and has a mammogram scheduled for December 30th. She also has a mild case of osteopenia, but it is not currently a cause for concern.        Oncology History  Breast cancer of upper-outer quadrant of right female breast (HCC)  07/30/2014 Mammogram   Right breast: Mass at 10 o'clock location 4 cm from the nipple measuring 1.7 x 1.3 x 1.3 cm. This corresponds to the palpable and mammographic finding. In the right axilla, there are several lymph nodes which are normal in size  measuring up to 4mm.   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%), PR- (0%), HER2/neu negative, Ki67 30%   08/27/2014 Clinical Stage   Stage IIA: T2 N0   09/22/2014 Definitive Surgery   Right lumpectomy/[facility] Carolynne Edouard): Invasive ductal carcinoma, grade 3, 2.6 cm, high grade DCIS, LVI present, ER-, PR-, 2 LN removed and 1 positive for metastatic carcinoma (1/2)   09/22/2014 Pathologic Stage   Stage IIB: T2 N1 M0   10/15/2014 - 12/16/2014 Chemotherapy   Adjuvant chemotherapy with Taxotere Cytoxan 4 q3 weeks   10/21/2014 - 10/24/2014 Hospital Admission   Admission for neutropenic fever UTI Klebsiella   01/26/2015 - 03/11/2015 Radiation Therapy   Adjuvant RT Michell Heinrich): Right breast / 45 Gray @ 1.8 Wallace Cullens per fraction x 25 fractions Right supraclavicular fossa/PAB  45 Gy @1 .8 Gy per fraction x 25 fractions Right breast boost / 16 Gray at [employer] per fraction x 8 fractions   05/19/2015 Survivorship   Survivorship care plan visit completed.   03/28/2018 Surgery   Laparoscopic cholecystectomy     REVIEW OF SYSTEMS:   Constitutional: Denies fevers, chills or abnormal weight loss All other systems were reviewed with the patient and are negative. Observations/Objective:     Assessment Plan:  Breast cancer of upper-outer quadrant of right female breast (HCC) Diagnosed 2016 treated with lumpectomy, Taxotere/Cytoxan as well as radiation therapy, triple negative   Breast cancer surveillance: 1.  Breast exam on 12/08/2021: Benign no palpable lumps or nodules 2.  Mammogram 05/17/2022: Benign breast  density category C  3.  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   Lab review: 01/19/2022:  Hemoglobin 12.9, ferritin 144, iron saturation 27% 08/03/2022: Hemoglobin 13.2, MCV 88.9, iron saturation 23%, ferritin 49 (no need of IV iron) 02/16/2023: Hemoglobin 12, MCV 90.1, iron saturation 19%, ferritin 20 (no need of IV iron) However the iron levels are slowly declining.  Therefore we will check labs again in 4 months. major depression: Was on Lexapro. Peripheral neuropathy: Slowly improving.  Pancreatitis: Takes creon (having diarrhea).  Worried about the cost of the medication.  She applied for grant and is waiting to hear back.    Osteopenia Mild osteopenia noted, but not currently worrisome. -Continue current management. -Review in 4 months.      Recheck labs in 4 months and telephone follow-up with me 2 days later    I discussed the assessment and treatment plan with the patient. The patient was provided an opportunity to ask questions and all were answered. The patient agreed with the plan and demonstrated an understanding of the instructions. The patient was advised to call back or seek an in-person evaluation if the symptoms worsen or if the condition fails to improve as anticipated.   I provided 12 minutes of non-face-to-face time during this encounter.  This includes time for charting and coordination of care   Tamsen Meek, MD

## 2023-02-22 ENCOUNTER — Telehealth: Payer: Self-pay | Admitting: Hematology and Oncology

## 2023-02-22 NOTE — Telephone Encounter (Signed)
Per [name] 10/7 los  Patient is aware of scheduled appointment times/dates regarding follow up visit

## 2023-02-24 ENCOUNTER — Other Ambulatory Visit: Payer: Self-pay | Admitting: Family Medicine

## 2023-02-24 DIAGNOSIS — I25119 Atherosclerotic heart disease of native coronary artery with unspecified angina pectoris: Secondary | ICD-10-CM | POA: Diagnosis not present

## 2023-02-24 DIAGNOSIS — M8580 Other specified disorders of bone density and structure, unspecified site: Secondary | ICD-10-CM

## 2023-02-24 DIAGNOSIS — I10 Essential (primary) hypertension: Secondary | ICD-10-CM | POA: Diagnosis not present

## 2023-02-24 DIAGNOSIS — M899 Disorder of bone, unspecified: Secondary | ICD-10-CM | POA: Diagnosis not present

## 2023-02-24 DIAGNOSIS — Z0000 Encounter for general adult medical examination without abnormal findings: Secondary | ICD-10-CM | POA: Diagnosis not present

## 2023-02-24 DIAGNOSIS — Z9181 History of falling: Secondary | ICD-10-CM | POA: Diagnosis not present

## 2023-02-24 DIAGNOSIS — D509 Iron deficiency anemia, unspecified: Secondary | ICD-10-CM | POA: Diagnosis not present

## 2023-02-24 DIAGNOSIS — K8689 Other specified diseases of pancreas: Secondary | ICD-10-CM | POA: Diagnosis not present

## 2023-02-27 ENCOUNTER — Emergency Department (HOSPITAL_COMMUNITY): Payer: Medicare Other

## 2023-02-27 ENCOUNTER — Other Ambulatory Visit: Payer: Self-pay

## 2023-02-27 ENCOUNTER — Encounter (HOSPITAL_COMMUNITY): Payer: Self-pay

## 2023-02-27 ENCOUNTER — Emergency Department (HOSPITAL_COMMUNITY)
Admission: EM | Admit: 2023-02-27 | Discharge: 2023-02-28 | Disposition: A | Discharge status: Home / Self Care | Payer: Medicare Other | Attending: Emergency Medicine | Admitting: Emergency Medicine

## 2023-02-27 DIAGNOSIS — Z91040 Latex allergy status: Secondary | ICD-10-CM | POA: Insufficient documentation

## 2023-02-27 DIAGNOSIS — S0181XA Laceration without foreign body of other part of head, initial encounter: Secondary | ICD-10-CM | POA: Diagnosis not present

## 2023-02-27 DIAGNOSIS — R6889 Other general symptoms and signs: Secondary | ICD-10-CM | POA: Diagnosis not present

## 2023-02-27 DIAGNOSIS — W01198A Fall on same level from slipping, tripping and stumbling with subsequent striking against other object, initial encounter: Secondary | ICD-10-CM | POA: Diagnosis not present

## 2023-02-27 DIAGNOSIS — Z7982 Long term (current) use of aspirin: Secondary | ICD-10-CM | POA: Diagnosis not present

## 2023-02-27 DIAGNOSIS — I2510 Atherosclerotic heart disease of native coronary artery without angina pectoris: Secondary | ICD-10-CM | POA: Insufficient documentation

## 2023-02-27 DIAGNOSIS — W19XXXA Unspecified fall, initial encounter: Secondary | ICD-10-CM

## 2023-02-27 DIAGNOSIS — I10 Essential (primary) hypertension: Secondary | ICD-10-CM | POA: Insufficient documentation

## 2023-02-27 DIAGNOSIS — S0993XA Unspecified injury of face, initial encounter: Secondary | ICD-10-CM | POA: Diagnosis present

## 2023-02-27 DIAGNOSIS — Z043 Encounter for examination and observation following other accident: Secondary | ICD-10-CM | POA: Diagnosis not present

## 2023-02-27 DIAGNOSIS — R58 Hemorrhage, not elsewhere classified: Secondary | ICD-10-CM | POA: Diagnosis not present

## 2023-02-27 DIAGNOSIS — Z981 Arthrodesis status: Secondary | ICD-10-CM | POA: Diagnosis not present

## 2023-02-27 MED ORDER — LORAZEPAM 1 MG PO TABS
0.5000 mg | ORAL_TABLET | Freq: Once | ORAL | Status: AC
  Administered 2023-02-27: 0.5 mg via ORAL
  Filled 2023-02-27: qty 1

## 2023-02-27 MED ORDER — LIDOCAINE HCL (PF) 1 % IJ SOLN
10.0000 mL | Freq: Once | INTRAMUSCULAR | Status: DC
  Filled 2023-02-27: qty 10

## 2023-02-27 MED ORDER — FLUORESCEIN SODIUM 1 MG OP STRP
1.0000 | ORAL_STRIP | Freq: Once | OPHTHALMIC | Status: AC
  Administered 2023-02-27: 1 via OPHTHALMIC
  Filled 2023-02-27: qty 1

## 2023-02-27 MED ORDER — TETRACAINE HCL 0.5 % OP SOLN
2.0000 [drp] | Freq: Once | OPHTHALMIC | Status: AC
  Administered 2023-02-27: 2 [drp] via OPHTHALMIC
  Filled 2023-02-27: qty 4

## 2023-02-27 NOTE — ED Provider Notes (Cosign Needed Addendum)
Patient care taken over at shift handoff from outgoing provider.  For full HPI, please refer to previous providers note.  Plan is to reassess after CT scans and plan for a laceration repair.  Physical Exam  BP (!) 175/91   Pulse 69   Temp 98.1 F (36.7 C)   Resp 18   SpO2 98%   Physical Exam Vitals and nursing note reviewed.  Constitutional:      Appearance: Normal appearance.  HENT:     Head: Normocephalic and atraumatic.     Comments: Laceration above the right eyebrow.    Mouth/Throat:     Mouth: Mucous membranes are moist.  Eyes:     General: No scleral icterus. Cardiovascular:     Rate and Rhythm: Normal rate and regular rhythm.     Pulses: Normal pulses.     Heart sounds: Normal heart sounds.  Pulmonary:     Effort: Pulmonary effort is normal.     Breath sounds: Normal breath sounds.  Abdominal:     General: Abdomen is flat.     Palpations: Abdomen is soft.     Tenderness: There is no abdominal tenderness.  Musculoskeletal:        General: No deformity.  Skin:    General: Skin is warm.     Findings: No rash.  Neurological:     General: No focal deficit present.     Mental Status: She is alert.     Comments: Cranial nerves II through XII intact. Intact sensation to light touch in all 4 extremities. 5/5 strength in all 4 extremities. Intact finger-to-nose and heel-to-shin of all 4 extremities. No visual field cuts. No neglect noted. No aphasia noted.   Psychiatric:        Mood and Affect: Mood normal.     Procedures  .Marland KitchenLaceration Repair  Date/Time: 02/27/2023 10:30 PM  Performed by: Kathleen Lime, MD Authorized by: Jeanelle Malling, PA   Consent:    Consent obtained:  Verbal   Consent given by:  Patient   Risks discussed:  Infection and pain Universal protocol:    Procedure explained and questions answered to patient or proxy's satisfaction: yes     Relevant documents present and verified: yes     Imaging studies available: yes     Patient identity confirmed:   Verbally with patient and arm band Anesthesia:    Anesthesia method:  Local infiltration   Local anesthetic:  Lidocaine 1% w/o epi Laceration details:    Location:  Face   Length (cm):  8   Depth (mm):  3 Pre-procedure details:    Preparation:  Patient was prepped and draped in usual sterile fashion Exploration:    Hemostasis achieved with:  Direct pressure   Imaging outcome: foreign body not noted     Contaminated: no   Treatment:    Area cleansed with:  Saline   Amount of cleaning:  Extensive   Irrigation solution:  Sterile saline and sterile water   Irrigation volume:  300   Irrigation method:  Pressure wash and syringe   Visualized foreign bodies/material removed: no     Debridement:  None Skin repair:    Repair method:  Sutures   Suture size:  4-0   Suture material:  Nylon   Suture technique:  Simple interrupted   Number of sutures:  12 Approximation:    Approximation:  Close Repair type:    Repair type:  Intermediate Post-procedure details:    Dressing:  Antibiotic ointment  Procedure completion:  Tolerated well, no immediate complications   ED Course / MDM    Medical Decision Making Amount and/or Complexity of Data Reviewed Radiology: ordered.  Risk Prescription drug management.   This patient presents to the ED for fall, this involves an extensive number of treatment options, and is a complaint that carries with a high risk of complications and morbidity.  The differential diagnosis includes fracture, dislocation, head bleed.  This is not an exhaustive list.  Imaging studies: CT head, cervical spine, maxillofacial ordered and pending.  Problem list/ ED course/ Critical interventions/ Medical management: HPI: See above Vital signs within normal range and stable throughout visit. Laboratory/imaging studies significant for: See above. On physical examination, patient is afebrile and appears in no acute distress.  There is a 8 cm laceration near the right  eye. Patient's GCS is 15, there is no history of LOC.  There was no focal neurological deficit on my examination.  Patient is well appearing and tolerating PO with no other injuries.  Laceration repair was performed by this EDP and medicine resident Mickie Bail, MD. See above for details.  Patient was observed for several hours in the department in stable condition. Due to bed bugs found on stretcher. CT scans were delayed. I have reviewed the patient home medicines and have made adjustments as needed.  Cardiac monitoring/EKG: The patient was maintained on a cardiac monitor.  I personally reviewed and interpreted the cardiac monitor which showed an underlying rhythm of: sinus rhythm.  Additional history obtained: External records from outside source obtained and reviewed including: Chart review including previous notes, labs, imaging.  Consultations obtained:  Disposition Patient's care signed off shift change to [employer], PA-C pending CT scans. This chart was dictated using voice recognition software.  Despite best efforts to proofread,  errors can occur which can change the documentation meaning.         Jeanelle Malling, PA 02/27/23 2349    Jeanelle Malling, PA 02/27/23 2351    Gerhard Munch, MD 02/28/23 [phone]

## 2023-02-27 NOTE — ED Triage Notes (Signed)
Patient arrived by [facility] following mechanical fall pta. Patient arrived with large uneven laceration above right eye. No loc, no thinners.  Arrived with dressing to same and c-collar. Alert and oriented, denies pain. Bleeding controlled with dressing

## 2023-02-27 NOTE — ED Provider Notes (Signed)
Patient signed out to me at shift change.  Here with mechanical fall.  Delay in getting CTs due to need for decontamination and repeat decontamination for bed bugs.  1:12 AM CTs resulted without serious traumatic injury.  C-collar removed and cleared by me.  She feels well and ready to go home.     Roxy Horseman, PA-C 02/28/23 0113    Gloris Manchester, MD 03/01/23 [phone]

## 2023-02-27 NOTE — ED Notes (Signed)
Assumed care on patient , patient waiting for CT scan , EDP sutured patient's laceration .

## 2023-02-27 NOTE — ED Notes (Signed)
Chem8 lab not needed per rn [place]

## 2023-02-27 NOTE — ED Provider Notes (Signed)
[place] EMERGENCY DEPARTMENT AT [facility] Provider Note   CSN: [id number] Arrival date & time: 02/27/23  1301     History No chief complaint on file.   Angela Bond is a 77 y.o. female with h/o breast cancer in remission, HTN, GERD, paroxysmal A-fib (not on blood thinner), CAD presents to the ER today for evaluation of mechanical fall. The patient reports that she was waiting for someone to change her tire when she was walking, tripped, and fell hitting her face on the curb. She denies any LOC.  Reports she is on a daily baby aspirin but denies any anticoagulant use.  She denies any significant headache.  She reports that her tetanus is up-to-date as she was recently seen by her primary care provider.  She denies any chest pain or shortness of breath.  Denies any extremity pain, back pain, belly pain, or chest pain.  She reports that she does have some pain with the laceration is. Multiple allergies.   HPI     Home Medications Prior to Admission medications   Medication Sig Start Date End Date Taking? Authorizing Provider  acetaminophen (TYLENOL) 500 MG tablet Take 1 tablet (500 mg total) by mouth every 6 (six) hours as needed for moderate pain or headache. 12/26/18   Montez Morita, PA-C  ALPRAZolam Prudy Feeler) 0.25 MG tablet Take 0.25 mg by mouth 2 (two) times daily as needed. 10/15/19   [provider]  aspirin EC 81 MG EC tablet Take 1 tablet (81 mg total) by mouth daily. 11/06/15   Robbie Lis M, PA-C  calcium carbonate (OSCAL) 1500 (600 Ca) MG TABS tablet Take by mouth.    [provider]  Calcium Carbonate-Vit D-Min (CALCIUM 1200 PO) Take by mouth.    [provider]  Cholecalciferol (VITAMIN D) 2000 UNITS tablet Take 2,000 Units by mouth 2 (two) times daily.     [provider]  dexlansoprazole (DEXILANT) 60 MG capsule Take 60 mg by mouth daily.    [provider]  escitalopram (LEXAPRO) 20 MG tablet Take 1 tablet (20 mg  total) by mouth daily. 12/08/21   Serena Croissant, MD  ezetimibe (ZETIA) 10 MG tablet Take 1 tablet (10 mg total) by mouth at bedtime. 09/07/18   Jake Bathe, MD  famotidine (PEPCID) 20 MG tablet Take 20 mg by mouth at bedtime.  08/31/18   [provider]  fexofenadine (ALLEGRA) 180 MG tablet Take 180 mg by mouth daily as needed for allergies.     [provider]  furosemide (LASIX) 20 MG tablet Take one tablet daily as needed for 3 lbs over night weight gain 09/07/18   Jake Bathe, MD  gabapentin (NEURONTIN) 600 MG tablet Take 1 tablet (600 mg total) by mouth 3 (three) times daily. 10/18/18   Hyatt, Max T, DPM  Magnesium 500 MG TABS Take 500 mg by mouth at bedtime.    [provider]  meclizine (ANTIVERT) 25 MG tablet Take 25 mg by mouth 3 (three) times daily as needed for dizziness.     [provider]  montelukast (SINGULAIR) 10 MG tablet Take 10 mg by mouth at bedtime. 08/23/18   [provider]  nitroGLYCERIN (NITROSTAT) 0.4 MG SL tablet Place 1 tablet (0.4 mg total) under the tongue every 5 (five) minutes as needed for chest pain (CP or SOB). 09/07/18   Jake Bathe, MD  nystatin cream (MYCOSTATIN) Apply 1 Application topically 2 (two) times daily. 12/08/21  Serena Croissant, MD  ondansetron (ZOFRAN-ODT) 4 MG disintegrating tablet Take 1 tablet (4 mg total) by mouth every 8 (eight) hours as needed for nausea or vomiting. 11/18/21   Henderly, Britni A, PA-C  Polyvinyl Alcohol-Povidone PF (REFRESH) 1.4-0.6 % SOLN Place 2 drops into both eyes 3 (three) times daily as needed (for dry eyes).    [provider]  potassium chloride (K-DUR) 10 MEQ tablet Take 10 mEq by mouth every 3 (three) days.    [provider]  triamcinolone cream (KENALOG) 0.1 % Apply 1 application topically 2 (two) times daily. 05/08/19   Fontaine, Nadyne Coombes, MD  vitamin C (ASCORBIC ACID) 500 MG tablet Take 500 mg by mouth daily.    [provider]  Zinc Acetate,  Oral, (ZINC ACETATE PO) Take by mouth.    [provider]      Allergies    Other, Oyster shell, Shellfish allergy, Aspirin, Penicillins, Fosamax [alendronate], Brilinta [ticagrelor], Ciprofloxacin, Colesevelam, Doxycycline, Evista [raloxifene], Fosamax [alendronate sodium], Lactose intolerance (gi), Latex, Naproxen, Naproxen sodium, Nitrofurantoin, Pravachol [pravastatin], Sulfa antibiotics, Tape, Welchol [colesevelam hcl], and Zocor [simvastatin]    Review of Systems   Review of Systems  Constitutional:  Negative for chills and fever.  Respiratory:  Negative for shortness of breath.   Cardiovascular:  Negative for chest pain.  Gastrointestinal:  Negative for abdominal pain.  Musculoskeletal:  Negative for arthralgias, back pain and myalgias.  Neurological:  Negative for syncope and light-headedness.    Physical Exam Updated Vital Signs BP (!) 175/91   Pulse 69   Temp 98.1 F (36.7 C)   Resp 18   SpO2 98%  Physical Exam Vitals and nursing note reviewed.  Constitutional:      General: She is not in acute distress.    Appearance: She is not toxic-appearing.  HENT:     Head:     Comments: Bandage around head, when removed, there is a large laceration over the left eyebrow. Not actively bleeding. Does appear to have some dried grassed. No Battle signs or raccoon eyes.  Appears to be semideep, concern for muscle involvement although difficult to tell given the extent of the laceration. Eyes:     Extraocular Movements: Extraocular movements intact.     Pupils: Pupils are equal, round, and reactive to light.     Comments: Denies pain with movement of her eyes. PERRLA. Does have conjunctival hemorrhage on exam.   Neck:     Comments: In c-collar Cardiovascular:     Rate and Rhythm: Normal rate.     Pulses: Normal pulses.  Pulmonary:     Effort: Pulmonary effort is normal. No respiratory distress.     Comments: No signs of trauma. Non tender to palpation. Surgical  abnormality seen to rigth breast which patient reports is at it's baseline. Chest:     Chest wall: No tenderness.  Abdominal:     Palpations: Abdomen is soft.     Tenderness: There is no abdominal tenderness. There is no guarding or rebound.     Comments: No overlying skin changes or signs of trauma.   Musculoskeletal:     Comments: No midline or paraspinal thoracic or lumbar tenderness palpation.  No overlying skin changes or signs of trauma.  She does not have any tenderness to the bilateral upper and lower extremities.  No snuffbox tenderness.  Palpable DP, PT, radial pulses that are symmetric.  Compartments are soft.  Sensation reportedly symmetric and at baseline per patient.  Skin:  General: Skin is warm and dry.  Neurological:     Mental Status: She is alert.     Motor: No weakness.     Comments: Moving all extremities. Reports sensation intact throughout. Answering questions appropriately with appropriate speech.  No facial droop noted.  Difficult to tell cranial nerve assessment given the extent of the laceration.  Smile is equal however.        ED Results / Procedures / Treatments   Labs (all labs ordered are listed, but only abnormal results are displayed) Labs Reviewed - No data to display  EKG None  Radiology No results found.  Procedures Procedures   Medications Ordered in ED Medications - No data to display  ED Course/ Medical Decision Making/ A&P  Medical Decision Making Amount and/or Complexity of Data Reviewed Radiology: ordered.  Risk Prescription drug management.   77 y.o. female presents to the ER for evaluation of head injury sp mechanical fall. Differential diagnosis includes but is not limited to trauma. Vital signs shows elevated BP at 175/91, otherwise unremarkable. Physical exam as noted above.   The patient reports that the fall was mechanical, she tripped and hit her head on the curb.  She does not have any signs of trauma to her body  nor does she have any tenderness palpation to her back, chest, abdomen, or extremities.  She is moving all extremities to command and is alert and oriented.  She is conversational with appropriate speech.  She is feeling anxiety will order her some Ativan.  She denies any chest pain or shortness of breath.  Tetanus was updated in 2020.  Pending CT scan at this time. Will need fluorescein exam to evaluate eye and wound cleaning/laceration repair.   3:52 PM Care of Angela Bond transferred to PA Janene Madeira at the end of my shift as the patient will require reassessment once labs/imaging have resulted. Patient presentation, ED course, and plan of care discussed with review of all pertinent labs and imaging. Please see his/her note for further details regarding further ED course and disposition. Plan at time of handoff is follow up on head CT and laceration repair. This may be altered or completely changed at the discretion of the oncoming team pending results of further workup.  Portions of this report may have been transcribed using voice recognition software. Every effort was made to ensure accuracy; however, inadvertent computerized transcription errors may be present.   Final Clinical Impression(s) / ED Diagnoses Final diagnoses:  None    Rx / DC Orders ED Discharge Orders     None         Achille Rich, PA-C 02/27/23 1641    Elayne Snare K, DO 02/28/23 [phone]

## 2023-02-28 ENCOUNTER — Ambulatory Visit: Payer: Medicare Other | Attending: Cardiology | Admitting: Cardiology

## 2023-02-28 ENCOUNTER — Encounter: Payer: Self-pay | Admitting: Cardiology

## 2023-02-28 VITALS — BP 144/66 | HR 92 | Ht <= 58 in | Wt 155.4 lb

## 2023-02-28 DIAGNOSIS — E7800 Pure hypercholesterolemia, unspecified: Secondary | ICD-10-CM

## 2023-02-28 DIAGNOSIS — I2510 Atherosclerotic heart disease of native coronary artery without angina pectoris: Secondary | ICD-10-CM

## 2023-02-28 MED ORDER — BACITRACIN ZINC 500 UNIT/GM EX OINT: TOPICAL_OINTMENT | Freq: Two times a day (BID) | CUTANEOUS | Status: DC

## 2023-02-28 NOTE — Progress Notes (Signed)
Cardiology Office Note:  .   Date:  02/28/2023  ID:  Angela Bond, DOB 03/16/1946, MRN [id number] PCP: Debroah Loop, DO  [facility] Providers Cardiologist:  Donato Schultz, MD     History of Present Illness: .   Angela Bond is a 77 y.o. female Discussed with the use of AI scribe   History of Present Illness   The 77 year old patient with a history of coronary artery disease, non-ST elevation myocardial infarction in 2017, hypertension, hyperlipidemia, and prior right breast cancer, presented for a follow-up visit. The patient underwent cardiac catheterization in 2013 and 2019, with the latter showing stent patency. The patient has been on Zetia 10mg  for hyperlipidemia and reports no chest pain or orthostatic symptoms in recent years.  The patient recently experienced a fall, resulting in a head injury that required 12 stitches. The fall was attributed to numbness in the feet, a side effect from previous chemotherapy for breast cancer. The patient reported feeling shaky since the fall and has been using a cane for support.  The patient also reported shortness of breath when walking uphill, which she manages by resting before continuing. Despite attempts to increase physical activity, the patient's weight has remained stable at around 155-156 lbs.  The patient has been diagnosed with pancreatitis and has been prescribed Creon, which she has been struggling to afford. Without the medication, the patient experiences frequent diarrhea. The patient was previously on lovastatin and Zetia for hyperlipidemia, but it is unclear if these medications are still being taken.        Studies Reviewed: .        Results LABS LDL: 125  DIAGNOSTIC Left heart catheterization: Stent patency, first diagonal jailed (07/2017)  Risk Assessment/Calculations:            Physical Exam:   VS:  BP (!) 144/66   Pulse 92   Ht 4\' 8"  (1.422 m)   Wt 155 lb 6.4 oz (70.5 kg)   SpO2 99%   BMI  34.84 kg/m    Wt Readings from Last 3 Encounters:  02/28/23 155 lb 6.4 oz (70.5 kg)  01/27/22 154 lb (69.9 kg)  01/24/22 153 lb 9.6 oz (69.7 kg)    GEN: Well nourished, well developed in no acute distress,above right eye laceration NECK: No JVD; No carotid bruits CARDIAC: RRR, no murmurs, no rubs, no gallops RESPIRATORY:  Clear to auscultation without rales, wheezing or rhonchi  ABDOMEN: Soft, non-tender, non-distended EXTREMITIES:  No edema; No deformity   ASSESSMENT AND PLAN: .    Assessment and Plan    Recent Fall with Head Injury Recent fall resulting in head injury requiring stitches. No loss of consciousness reported. 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 -Encouraged use of assistive device (cane) for stability.  Coronary Artery Disease History of non-ST elevation myocardial infarction in 2017 treated with drug-eluting stent in the LAD. No current chest pain reported. -Continue Aspirin 81mg  daily for secondary prevention.  Pancreatitis New diagnosis of pancreatitis, currently managed with Creon. Patient reports cost-related non-adherence. -Encouraged patient to continue efforts to secure financial assistance for Creon. -Advised to hold off on lipid-lowering therapy (Ezetimibe) until pancreatitis is better controlled.  Hyperlipidemia LDL 125, previously managed with Lovastatin and Ezetimibe. Currently off both medications due to pancreatitis. -Plan to reassess need for lipid-lowering therapy once pancreatitis is better controlled.  Hypertension No current complaints of orthostasis. -Continue current management.  Follow-up in 1 year.  Signed, Donato Schultz, MD

## 2023-02-28 NOTE — Patient Instructions (Signed)

## 2023-03-02 DIAGNOSIS — S01111A Laceration without foreign body of right eyelid and periocular area, initial encounter: Secondary | ICD-10-CM | POA: Diagnosis not present

## 2023-03-09 DIAGNOSIS — S01111D Laceration without foreign body of right eyelid and periocular area, subsequent encounter: Secondary | ICD-10-CM | POA: Diagnosis not present

## 2023-03-09 DIAGNOSIS — L0291 Cutaneous abscess, unspecified: Secondary | ICD-10-CM | POA: Diagnosis not present

## 2023-03-13 DIAGNOSIS — S01111D Laceration without foreign body of right eyelid and periocular area, subsequent encounter: Secondary | ICD-10-CM | POA: Diagnosis not present

## 2023-03-17 DIAGNOSIS — S01111D Laceration without foreign body of right eyelid and periocular area, subsequent encounter: Secondary | ICD-10-CM | POA: Diagnosis not present

## 2023-03-31 DIAGNOSIS — H2513 Age-related nuclear cataract, bilateral: Secondary | ICD-10-CM | POA: Diagnosis not present

## 2023-04-05 ENCOUNTER — Ambulatory Visit (HOSPITAL_COMMUNITY)
Admission: EM | Admit: 2023-04-05 | Discharge: 2023-04-05 | Disposition: A | Discharge status: Home / Self Care | Payer: Medicare Other | Attending: Emergency Medicine | Admitting: Emergency Medicine

## 2023-04-05 ENCOUNTER — Encounter: Payer: Self-pay | Admitting: Hematology and Oncology

## 2023-04-05 ENCOUNTER — Encounter (HOSPITAL_COMMUNITY): Payer: Self-pay | Admitting: Emergency Medicine

## 2023-04-05 DIAGNOSIS — J069 Acute upper respiratory infection, unspecified: Secondary | ICD-10-CM | POA: Diagnosis not present

## 2023-04-05 MED ORDER — FLUTICASONE PROPIONATE 50 MCG/ACT NA SUSP: 2.0000 | Freq: Every day | NASAL | 2 refills | Status: AC

## 2023-04-05 NOTE — Discharge Instructions (Signed)
Continue mucinex twice daily With LOTS of fluids! Start once daily nasal spray (flonase)  Allow 4-5 days for symptoms to continue improving Please return if needed

## 2023-04-05 NOTE — ED Provider Notes (Signed)
[facility]    CSN: [id number] Arrival date & time: 04/05/23  1039      History   Chief Complaint Chief Complaint  Patient presents with   Cough   Nasal Congestion    HPI Angela Bond is a 77 y.o. female.  3-day history of nasal congestion, runny nose, slight cough Had a fever 101 on initial day of symptoms Today she is feeling much better.  She has been using Mucinex twice daily, Tylenol as needed.  Taking in lots of fluids  Not having headache, sinus pressure, ear pain. No shortness of breath, wheezing, chest pain. Several sick contacts at her church  Past Medical History:  Diagnosis Date   Anemia    hx   Anxiety and depression    Aortic atherosclerosis (HCC) 07/31/2017   Arthritis    "~ all over my body; in the joints"   Breast cancer, right (HCC) 3/16   Coronary artery disease    a.  s/p NSTEMI in June 2017 with DES to mid LAD.   Depression    Gallstones    GERD (gastroesophageal reflux disease)    H/O hiatal hernia    Headache    Heart murmur    History of blood transfusion    "when taking chemo & w/one of my knee replacements" (11/05/2015)   Hypercholesteremia    Hypertension    Iron deficiency anemia 02/16/2017   Multiple bruises    Myocardial infarction (HCC)    Orthostatic hypotension    Personal history of chemotherapy    Personal history of radiation therapy    Pneumonia X 1   Shingles 5/15   hx   Sliding hiatal hernia s/p lap PEH repair 07/31/2012   Unstable angina (HCC) 01/23/2012   Unsteady gait    " I fall easily"   Wears dentures    partial   Wears glasses     Patient Active Problem List   Diagnosis Date Noted   Fall at home 10/18/2019   Nasal and sinus discharge 10/18/2019   Anxiety and depression    Coronary artery disease    Closed fracture of right distal humerus 12/25/2018   Syncope 12/12/2018   Facial fracture due to fall, closed, initial encounter (HCC) 12/12/2018   Bedbug bite 12/12/2018   Asymptomatic  bacteriuria 08/17/2018   Hypomagnesemia 08/15/2018   Fever 08/15/2018   Gallstones 03/28/2018   Obesity (BMI 30-39.9) 07/31/2017   Aortic atherosclerosis (HCC) 07/31/2017   Iron deficiency anemia 02/16/2017   CAD (coronary artery disease), native coronary artery 01/16/2016   Carpal tunnel syndrome of right wrist 12/22/2015   Elbow pain, right 11/24/2015   Orthostatic hypotension 11/19/2015   Edema of hand 06/15/2015   Pain in thumb joint with movement of right hand 06/15/2015   Hypokalemia 10/21/2014   Breast cancer of upper-outer quadrant of right female breast (HCC) 09/09/2014   Lumbar spinal stenosis 07/31/2014   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/19/2014   Sensation of foreign body in eye 06/19/2014   GERD (gastroesophageal reflux disease)    Depression    Hypercholesteremia    Arthritis    Paroxysmal atrial fibrillation (HCC) 08/12/2012   Hypertension    Sliding hiatal hernia s/p lap PEH repair 07/31/2012   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   Conjunctivochalasis 02/07/2012   Tearing 02/07/2012   Unstable angina (HCC) 01/23/2012   Anxiety 04/28/2011   Stenosis of left lacrimal passage 04/28/2011    Past Surgical History:  Procedure Laterality Date   BREAST BIOPSY Right    BREAST LUMPECTOMY Right 08/2014   BREAST LUMPECTOMY WITH SENTINEL LYMPH NODE BIOPSY Right 09/22/2014   Procedure: RIGHT BREAST LUMPECTOMY WITH NEEDLE LOCALIZATION AND RIGHT AXILLARY SENTINEL LYMPH NODE BIOPSY;  Surgeon: Chevis Pretty III, MD;  Location: [facility] OR;  Service: General;  Laterality: Right;   CARDIAC CATHETERIZATION     CARDIAC CATHETERIZATION N/A 11/05/2015   Procedure: Left Heart Cath and Coronary Angiography;  Surgeon: Lyn Records, MD;  Location: [facility] INVASIVE CV LAB;  Service: Cardiovascular;  Laterality: N/A;   CARDIAC CATHETERIZATION N/A 11/05/2015   Procedure: Coronary Stent Intervention;  Surgeon: Lyn Records, MD;  Location: [facility] INVASIVE CV LAB;  Service:  Cardiovascular;  Laterality: N/A;   CARPAL TUNNEL RELEASE Right 09/2015   CHOLECYSTECTOMY N/A 03/28/2018   Procedure: LAPAROSCOPIC CHOLECYSTECTOMY WITH INTRAOPERATIVE CHOLANGIOGRAM ERAS PATHWAY;  Surgeon: Griselda Miner, MD;  Location: [facility] OR;  Service: General;  Laterality: N/A;   CORONARY ANGIOPLASTY WITH STENT PLACEMENT  11/05/2015   "1 stent"   ESOPHAGEAL MANOMETRY N/A 07/02/2012   Procedure: ESOPHAGEAL MANOMETRY (EM);  Surgeon: Shirley Friar, MD;  Location: [facility];  Service: Endoscopy;  Laterality: N/A;  schooler to read   EYE SURGERY Left 2013   tube placed tear duct   HIATAL HERNIA REPAIR N/A 08/10/2012   Procedure: LAPAROSCOPIC REPAIR OF HIATAL HERNIA WITH MESH AND EGD WITH PEG TUBE PLACEMENT;  Surgeon: Axel Filler, MD;  Location: [facility] ORS;  Service: General;  Laterality: N/A;   LEFT HEART CATH AND CORONARY ANGIOGRAPHY N/A 08/01/2017   Procedure: LEFT HEART CATH AND CORONARY ANGIOGRAPHY;  Surgeon: Corky Crafts, MD;  Location: [facility] INVASIVE CV LAB;  Service: Cardiovascular;  Laterality: N/A;   ORIF ELBOW FRACTURE Right 12/25/2018   ORIF HUMERUS FRACTURE Right 12/25/2018   Procedure: OPEN REDUCTION INTERNAL FIXATION (ORIF) DISTAL HUMERUS FRACTURE;  Surgeon: Myrene Galas, MD;  Location: [facility];  Service: Orthopedics;  Laterality: Right;   PORT-A-CATH REMOVAL  06/2015   PORTACATH PLACEMENT Left 09/22/2014   Procedure: INSERTION PORT-A-CATH;  Surgeon: Chevis Pretty III, MD;  Location: [facility] OR;  Service: General;  Laterality: Left;   POSTERIOR LUMBAR FUSION  2016   "had 2 vertebrae fused"   TONSILLECTOMY  as child   TOTAL KNEE ARTHROPLASTY Bilateral 2011,2012    OB History     Gravida  1   Para  1   Term      Preterm      AB      Living  1      SAB      IAB      Ectopic      Multiple      Live Births               Home Medications    Prior to Admission medications   Medication Sig Start Date End Date Taking? Authorizing Provider  fluticasone  (FLONASE) 50 MCG/ACT nasal spray Place 2 sprays into both nostrils daily. 04/05/23  Yes Darlis Wragg, Lurena Joiner, PA-C  ALPRAZolam (XANAX) 0.25 MG tablet Take 0.25 mg by mouth 2 (two) times daily as needed. 10/15/19   [provider]  aspirin EC 81 MG EC tablet Take 1 tablet (81 mg total) by mouth daily. 11/06/15   Robbie Lis M, PA-C  calcium carbonate (OSCAL) 1500 (600 Ca) MG TABS tablet Take 1 tablet by mouth daily.    [provider]  Cholecalciferol (VITAMIN D) 2000 UNITS tablet Take 2,000  Units by mouth daily.    [provider]  CREON 47829-562130 units CPEP capsule 2 capsules with each meal and 1 capsule with each snack Orally with food for 90 days 10/14/22   [provider]  escitalopram (LEXAPRO) 20 MG tablet Take 1 tablet (20 mg total) by mouth daily. 12/08/21   Serena Croissant, MD  famotidine (PEPCID) 20 MG tablet Take 20 mg by mouth at bedtime.  08/31/18   [provider]  fexofenadine (ALLEGRA) 180 MG tablet Take 180 mg by mouth daily as needed for allergies.     [provider]  Magnesium 500 MG TABS Take 500 mg by mouth at bedtime.    [provider]  nitroGLYCERIN (NITROSTAT) 0.4 MG SL tablet Place 1 tablet (0.4 mg total) under the tongue every 5 (five) minutes as needed for chest pain (CP or SOB). 09/07/18   Jake Bathe, MD  pantoprazole (PROTONIX) 40 MG tablet 1 tablet daily. 11/07/22   [provider]  Polyvinyl Alcohol-Povidone PF (REFRESH) 1.4-0.6 % SOLN Place 2 drops into both eyes 3 (three) times daily as needed (for dry eyes).    [provider]  vitamin C (ASCORBIC ACID) 500 MG tablet Take 500 mg by mouth daily.    [provider]    Family History Family History  Problem Relation Age of Onset   Heart disease Mother        33s, CHF   Heart disease Father        d/o MI at 44   Cancer Father        skin   Diabetes Father    Cancer Sister 62       breast   Diabetes Sister    Heart  disease Sister        CABG in mid-60s    Social History Social History   Tobacco Use   Smoking status: Never   Smokeless tobacco: Never  Vaping Use   Vaping status: Never Used  Substance Use Topics   Alcohol use: No   Drug use: No     Allergies   Other, Oyster shell, Shellfish allergy, Aspirin, Penicillins, Fosamax [alendronate], Brilinta [ticagrelor], Ciprofloxacin, Colesevelam, Doxycycline, Evista [raloxifene], Fosamax [alendronate sodium], Lactose intolerance (gi), Latex, Naproxen, Naproxen sodium, Nitrofurantoin, Pravachol [pravastatin], Sulfa antibiotics, Tape, Welchol [colesevelam hcl], and Zocor [simvastatin]   Review of Systems Review of Systems   Physical Exam Triage Vital Signs ED Triage Vitals  Encounter Vitals Group     BP 04/05/23 1123 138/75     Systolic BP Percentile --      Diastolic BP Percentile --      Pulse Rate 04/05/23 1123 98     Resp 04/05/23 1123 18     Temp 04/05/23 1123 98.6 F (37 C)     Temp Source 04/05/23 1123 Oral     SpO2 04/05/23 1123 97 %     Weight --      Height --      Head Circumference --      Peak Flow --      Pain Score 04/05/23 1126 5     Pain Loc --      Pain Education --      Exclude from Growth Chart --    No data found.  Updated Vital Signs BP 138/75 (BP Location: Left Arm)   Pulse 98   Temp 98.6 F (37 C) (Oral)   Resp 18   SpO2 97%    Physical  Exam Vitals and nursing note reviewed.  Constitutional:      Appearance: She is not ill-appearing.  HENT:     Right Ear: Tympanic membrane and ear canal normal.     Left Ear: Tympanic membrane and ear canal normal.     Nose: Congestion present. No rhinorrhea.     Right Sinus: No maxillary sinus tenderness or frontal sinus tenderness.     Left Sinus: No maxillary sinus tenderness or frontal sinus tenderness.     Mouth/Throat:     Mouth: Mucous membranes are moist.     Pharynx: Oropharynx is clear. No posterior oropharyngeal erythema.  Eyes:      Conjunctiva/sclera: Conjunctivae normal.  Cardiovascular:     Rate and Rhythm: Normal rate and regular rhythm.     Pulses: Normal pulses.     Heart sounds: Normal heart sounds.  Pulmonary:     Effort: Pulmonary effort is normal.     Breath sounds: Normal breath sounds.     Comments: Lungs clear throughout.  Unlabored breathing.  Infrequent dry cough during clinic visit Musculoskeletal:     Cervical back: Normal range of motion.  Lymphadenopathy:     Cervical: No cervical adenopathy.  Skin:    General: Skin is warm and dry.  Neurological:     Mental Status: She is alert and oriented to person, place, and time.     UC Treatments / Results  Labs (all labs ordered are listed, but only abnormal results are displayed) Labs Reviewed - No data to display  EKG   Radiology No results found.  Procedures Procedures (including critical care time)  Medications Ordered in UC Medications - No data to display  Initial Impression / Assessment and Plan / UC Course  I have reviewed the triage vital signs and the nursing notes.  Pertinent labs & imaging results that were available during my care of the patient were reviewed by me and considered in my medical decision making (see chart for details).  Afebrile, well appearing  Viral etiology, symptomatic care. She is already feeling better using mucinex. Recommend continue and add nasal spray. Allow several more days for continued improvement, can return with any changes. Patient agreeable to plan  Final Clinical Impressions(s) / UC Diagnoses   Final diagnoses:  Viral upper respiratory tract infection     Discharge Instructions      Continue mucinex twice daily With LOTS of fluids! Start once daily nasal spray (flonase)  Allow 4-5 days for symptoms to continue improving Please return if needed    ED Prescriptions     Medication Sig Dispense Auth. Provider   fluticasone (FLONASE) 50 MCG/ACT nasal spray Place 2 sprays into  both nostrils daily. 9.9 mL Earnestine Tuohey, Lurena Joiner, PA-C      PDMP not reviewed this encounter.   Daphne Karrer, Lurena Joiner, PA-C 04/05/23 1210

## 2023-04-05 NOTE — ED Triage Notes (Addendum)
Pt c/o cough, congestion, sinus pressure, and fever since Sunday. States she had temp of 101 on Sunday.  Took tylenol around 9am this morning.  States last sinus infection she had she took cephalexin

## 2023-05-03 DIAGNOSIS — H25812 Combined forms of age-related cataract, left eye: Secondary | ICD-10-CM | POA: Diagnosis not present

## 2023-05-03 DIAGNOSIS — H2512 Age-related nuclear cataract, left eye: Secondary | ICD-10-CM | POA: Diagnosis not present

## 2023-05-08 ENCOUNTER — Encounter: Payer: Self-pay | Admitting: Hematology and Oncology

## 2023-05-15 ENCOUNTER — Ambulatory Visit
Admission: RE | Admit: 2023-05-15 | Discharge: 2023-05-15 | Disposition: A | Discharge status: Home / Self Care | Payer: Medicare Other | Source: Ambulatory Visit | Attending: Family Medicine | Admitting: Family Medicine

## 2023-05-15 ENCOUNTER — Encounter: Payer: Self-pay | Admitting: Hematology and Oncology

## 2023-05-15 DIAGNOSIS — Z1231 Encounter for screening mammogram for malignant neoplasm of breast: Secondary | ICD-10-CM

## 2023-05-23 ENCOUNTER — Encounter: Payer: Self-pay | Admitting: Hematology and Oncology

## 2023-05-30 ENCOUNTER — Ambulatory Visit
Admission: RE | Admit: 2023-05-30 | Discharge: 2023-05-30 | Disposition: A | Discharge status: Home / Self Care | Payer: Medicare Other | Source: Ambulatory Visit | Attending: Family Medicine | Admitting: Family Medicine

## 2023-05-30 ENCOUNTER — Encounter: Payer: Self-pay | Admitting: Hematology and Oncology

## 2023-05-30 DIAGNOSIS — M8580 Other specified disorders of bone density and structure, unspecified site: Secondary | ICD-10-CM

## 2023-07-10 ENCOUNTER — Inpatient Hospital Stay: Payer: Medicare Other | Attending: Hematology and Oncology

## 2023-07-10 DIAGNOSIS — Z853 Personal history of malignant neoplasm of breast: Secondary | ICD-10-CM | POA: Diagnosis not present

## 2023-07-10 DIAGNOSIS — D509 Iron deficiency anemia, unspecified: Secondary | ICD-10-CM | POA: Diagnosis present

## 2023-07-10 LAB — CBC WITH DIFFERENTIAL (CANCER CENTER ONLY)
Abs Immature Granulocytes: 0.02 10*3/uL (ref 0.00–0.07)
Basophils Absolute: 0 10*3/uL (ref 0.0–0.1)
Basophils Relative: 1 %
Eosinophils Absolute: 0.2 10*3/uL (ref 0.0–0.5)
Eosinophils Relative: 3 %
HCT: 34.2 % — ABNORMAL LOW (ref 36.0–46.0)
Hemoglobin: 11.1 g/dL — ABNORMAL LOW (ref 12.0–15.0)
Immature Granulocytes: 0 %
Lymphocytes Relative: 25 %
Lymphs Abs: 1.5 10*3/uL (ref 0.7–4.0)
MCH: 26.9 pg (ref 26.0–34.0)
MCHC: 32.5 g/dL (ref 30.0–36.0)
MCV: 83 fL (ref 80.0–100.0)
Monocytes Absolute: 0.4 10*3/uL (ref 0.1–1.0)
Monocytes Relative: 7 %
Neutro Abs: 4 10*3/uL (ref 1.7–7.7)
Neutrophils Relative %: 64 %
Platelet Count: 214 10*3/uL (ref 150–400)
RBC: 4.12 MIL/uL (ref 3.87–5.11)
RDW: 14.3 % (ref 11.5–15.5)
WBC Count: 6.2 10*3/uL (ref 4.0–10.5)
nRBC: 0 % (ref 0.0–0.2)

## 2023-07-10 LAB — IRON AND IRON BINDING CAPACITY (CC-WL,HP ONLY)
Iron: 50 ug/dL (ref 28–170)
Saturation Ratios: 11 % (ref 10.4–31.8)
TIBC: 440 ug/dL (ref 250–450)
UIBC: 390 ug/dL (ref 148–442)

## 2023-07-10 LAB — FERRITIN: Ferritin: 10 ng/mL — ABNORMAL LOW (ref 11–307)

## 2023-07-13 ENCOUNTER — Telehealth: Payer: Self-pay

## 2023-07-13 ENCOUNTER — Inpatient Hospital Stay: Payer: Medicare Other | Admitting: Hematology and Oncology

## 2023-07-13 ENCOUNTER — Other Ambulatory Visit: Payer: Self-pay | Admitting: Hematology and Oncology

## 2023-07-13 DIAGNOSIS — D509 Iron deficiency anemia, unspecified: Secondary | ICD-10-CM | POA: Diagnosis not present

## 2023-07-13 DIAGNOSIS — Z171 Estrogen receptor negative status [ER-]: Secondary | ICD-10-CM | POA: Diagnosis not present

## 2023-07-13 DIAGNOSIS — C50411 Malignant neoplasm of upper-outer quadrant of right female breast: Secondary | ICD-10-CM | POA: Diagnosis not present

## 2023-07-13 NOTE — Assessment & Plan Note (Signed)
 Diagnosed 2016 treated with lumpectomy, Taxotere/Cytoxan as well as radiation therapy, triple negative   Breast cancer surveillance: 1.  Breast exam on 12/08/2021: Benign no palpable lumps or nodules 2.  Mammogram 05/19/2023: Benign breast density category C  3.  Bone density 05/30/2023: T score -2.5: Osteoporosis: I recommend calcium vitamin D weightbearing exercises and bisphosphonate therapy

## 2023-07-13 NOTE — Assessment & Plan Note (Signed)
 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   Lab review: 01/19/2022: Hemoglobin 12.9, ferritin 144, iron saturation 27% 08/03/2022: Hemoglobin 13.2, MCV 88.9, iron saturation 23%, ferritin 49 (no need of IV iron) 02/16/2023: Hemoglobin 12, MCV 90.1, iron saturation 19%, ferritin 20 (no need of IV iron) 07/10/2023: Hemoglobin 11.1, MCV 83, iron saturation 11%, ferritin 10   Recommended IV iron therapy. Peripheral neuropathy: Slowly improving.   Pancreatitis: Takes creon (having diarrhea).  Worried about the cost of the medication.  She applied for grant and is waiting to hear back.   Recheck labs in 6 months and telephone visit follow-up after that

## 2023-07-13 NOTE — Telephone Encounter (Signed)
 Dr. [name], patient will be scheduled as soon as possible.  Auth Submission: NO AUTH NEEDED Site of care: Site of care: [facility] Payer: [employer] medicare Medication & CPT/J Code(s) submitted: Feraheme (ferumoxytol) [id number] Route of submission (phone, fax, portal): portal Phone # Fax # Auth type: Buy/Bill PB Units/visits requested: 510mg  x 2 doses Reference number: [id number] Approval from: 07/13/23 to 01/10/24

## 2023-07-13 NOTE — Progress Notes (Signed)
 HEMATOLOGY-ONCOLOGY TELEPHONE VISIT PROGRESS NOTE  I connected with our patient on 07/13/23 at  8:00 AM EST by telephone and verified that I am speaking with the correct person using two identifiers.  I discussed the limitations, risks, security and privacy concerns of performing an evaluation and management service by telephone and the availability of in person appointments.  I also discussed with the patient that there may be a patient responsible charge related to this service. The patient expressed understanding and agreed to proceed.   History of Present Illness: Follow-up of iron deficiency anemia and history of breast cancer  History of Present Illness Angela Bond, a patient with a history of iron deficiency anemia and pancreatitis, reports decreased energy levels and ongoing stomach pain. She recently underwent cataract surgery, which she reports is recovering well. Her energy levels are described as "pretty good," but she notes a tendency to sleep a lot due to feeling tired. She also reports falling asleep unexpectedly while sitting on the couch.  Her ongoing stomach pain is attributed to her pancreatitis. She was prescribed Creon, but due to financial constraints, she has not been able to afford the medication and is currently not taking anything for her pancreatitis. She plans to follow up with her doctor to discuss alternatives.  In addition to these issues, she has been diagnosed with osteoporosis following a recent bone density test. She expresses hesitation about receiving an infusion treatment for her osteoporosis due to past negative experiences with treatments.    Oncology History  Breast cancer of upper-outer quadrant of right female breast (HCC)  07/30/2014 Mammogram   Right breast: Mass at 10 o'clock location 4 cm from the nipple measuring 1.7 x 1.3 x 1.3 cm. This corresponds to the palpable and mammographic finding. In the right axilla, there are several lymph nodes which are  normal in size measuring up to 4mm.   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%), PR- (0%), HER2/neu negative, Ki67 30%   08/27/2014 Clinical Stage   Stage IIA: T2 N0   09/22/2014 Definitive Surgery   Right lumpectomy/[facility] Carolynne Edouard): Invasive ductal carcinoma, grade 3, 2.6 cm, high grade DCIS, LVI present, ER-, PR-, 2 LN removed and 1 positive for metastatic carcinoma (1/2)   09/22/2014 Pathologic Stage   Stage IIB: T2 N1 M0   10/15/2014 - 12/16/2014 Chemotherapy   Adjuvant chemotherapy with Taxotere Cytoxan 4 q3 weeks   10/21/2014 - 10/24/2014 Hospital Admission   Admission for neutropenic fever UTI Klebsiella   01/26/2015 - 03/11/2015 Radiation Therapy   Adjuvant RT Michell Heinrich): Right breast / 45 Gray @ 1.8 Wallace Cullens per fraction x 25 fractions Right supraclavicular fossa/PAB  45 Gy @1 .8 Gy per fraction x 25 fractions Right breast boost / 16 Gray at [employer] per fraction x 8 fractions   05/19/2015 Survivorship   Survivorship care plan visit completed.   03/28/2018 Surgery   Laparoscopic cholecystectomy     REVIEW OF SYSTEMS:   Constitutional: Denies fevers, chills or abnormal weight loss All other systems were reviewed with the patient and are negative. Observations/Objective:     Assessment Plan:  Breast cancer of upper-outer quadrant of right female breast (HCC) Diagnosed 2016 treated with lumpectomy, Taxotere/Cytoxan as well as radiation therapy, triple negative   Breast cancer surveillance: 1.  Breast exam on 12/08/2021: Benign no palpable lumps or nodules 2.  Mammogram 05/19/2023: Benign breast density category C  3.  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   Lab review: 01/19/2022: Hemoglobin 12.9, ferritin 144, iron saturation 27% 08/03/2022: Hemoglobin 13.2, MCV 88.9, iron saturation 23%, ferritin 49 (no need of IV iron) 02/16/2023: Hemoglobin 12, MCV 90.1, iron saturation 19%, ferritin 20 (no need of IV iron) 07/10/2023: Hemoglobin 11.1, MCV 83, iron saturation 11%, ferritin 10   Recommended IV iron therapy. Peripheral neuropathy: Slowly improving.   Pancreatitis: Unable to take creon due to cost (having diarrhea).     Recheck labs in 6 months and telephone visit follow-up after that --------------------------------- Assessment and Plan Assessment & Plan Iron Deficiency Anemia Hemoglobin slightly decreased to 11.1 and ferritin decreased to 10. Patient reports increased fatigue and excessive sleepiness. Last iron infusion was several months ago. -Administer two doses of iron infusion at [facility] location, one week apart.  Chronic Pancreatitis Patient reports ongoing abdominal pain and is currently not taking Creon due to cost. -Encouraged patient to discuss alternative treatments with her primary care physician.  Osteoporosis DEXA scan shows worsening bone density, now in the osteoporosis range. Patient is hesitant about receiving annual infusion therapy. -Discussed the risks of fractures and encouraged patient to consider infusion therapy.  General Health Maintenance -Mammogram results were good. -Plan to recheck labs in six months.      I discussed the assessment and treatment plan with the patient. The patient was provided an opportunity to ask questions and all were answered. The patient agreed with the plan and demonstrated an understanding of the instructions. The patient was advised to call back or seek an in-person evaluation if the symptoms worsen or if the condition fails to improve as anticipated.   I provided 20 minutes of non-face-to-face time during this encounter.  This  includes time for charting and coordination of care   Tamsen Meek, MD

## 2023-07-20 ENCOUNTER — Ambulatory Visit: Payer: Medicare Other

## 2023-07-20 VITALS — BP 197/91 | HR 72 | Temp 98.4°F | Resp 16 | Ht <= 58 in | Wt 153.2 lb

## 2023-07-20 DIAGNOSIS — D509 Iron deficiency anemia, unspecified: Secondary | ICD-10-CM

## 2023-07-20 MED ORDER — SODIUM CHLORIDE 0.9 % IV SOLN
510.0000 mg | Freq: Once | INTRAVENOUS | Status: AC
  Administered 2023-07-20: 510 mg via INTRAVENOUS
  Filled 2023-07-20: qty 17

## 2023-07-20 MED ORDER — DIPHENHYDRAMINE HCL 25 MG PO CAPS
25.0000 mg | ORAL_CAPSULE | Freq: Once | ORAL | Status: AC
  Administered 2023-07-20: 25 mg via ORAL
  Filled 2023-07-20: qty 1

## 2023-07-20 MED ORDER — ACETAMINOPHEN 325 MG PO TABS
650.0000 mg | ORAL_TABLET | Freq: Once | ORAL | Status: AC
  Administered 2023-07-20: 650 mg via ORAL
  Filled 2023-07-20: qty 2

## 2023-07-20 NOTE — Progress Notes (Signed)
 Diagnosis: Iron Deficiency Anemia  Provider:  Chilton Greathouse MD  Procedure: IV Infusion  IV Type: Peripheral, IV Location: L Hand  Feraheme (Ferumoxytol), Dose: 510 mg  Infusion Start Time: 1452  Infusion Stop Time: 1513  Post Infusion IV Care: Observation period completed and Peripheral IV Discontinued  Discharge: Condition: Good, Destination: Home . AVS Provided  Performed by:  Adriana Mccallum, RN

## 2023-07-20 NOTE — Patient Instructions (Signed)

## 2023-07-27 ENCOUNTER — Ambulatory Visit (INDEPENDENT_AMBULATORY_CARE_PROVIDER_SITE_OTHER): Payer: Self-pay

## 2023-07-27 VITALS — BP 153/88 | HR 69 | Temp 97.5°F | Resp 20 | Ht <= 58 in | Wt 148.8 lb

## 2023-07-27 DIAGNOSIS — D509 Iron deficiency anemia, unspecified: Secondary | ICD-10-CM

## 2023-07-27 MED ORDER — DIPHENHYDRAMINE HCL 25 MG PO CAPS
25.0000 mg | ORAL_CAPSULE | Freq: Once | ORAL | Status: AC
  Administered 2023-07-27: 25 mg via ORAL
  Filled 2023-07-27: qty 1

## 2023-07-27 MED ORDER — ACETAMINOPHEN 325 MG PO TABS
650.0000 mg | ORAL_TABLET | Freq: Once | ORAL | Status: AC
  Administered 2023-07-27: 650 mg via ORAL
  Filled 2023-07-27: qty 2

## 2023-07-27 MED ORDER — SODIUM CHLORIDE 0.9 % IV SOLN
510.0000 mg | Freq: Once | INTRAVENOUS | Status: DC
  Filled 2023-07-27: qty 17

## 2023-07-27 NOTE — Progress Notes (Signed)
 Unable to obtain IV access. Patient wished to cancel and reschedule for Tuesday 08/01/23.

## 2023-08-01 ENCOUNTER — Ambulatory Visit

## 2023-08-01 ENCOUNTER — Telehealth: Payer: Self-pay

## 2023-08-01 VITALS — BP 160/80 | HR 64 | Temp 98.2°F | Resp 18 | Ht <= 58 in | Wt 151.4 lb

## 2023-08-01 DIAGNOSIS — D509 Iron deficiency anemia, unspecified: Secondary | ICD-10-CM | POA: Diagnosis not present

## 2023-08-01 MED ORDER — SODIUM CHLORIDE 0.9 % IV SOLN
510.0000 mg | Freq: Once | INTRAVENOUS | Status: AC
  Administered 2023-08-01: 510 mg via INTRAVENOUS
  Filled 2023-08-01: qty 17

## 2023-08-01 MED ORDER — ACETAMINOPHEN 325 MG PO TABS
650.0000 mg | ORAL_TABLET | Freq: Once | ORAL | Status: AC
  Administered 2023-08-01: 650 mg via ORAL
  Filled 2023-08-01: qty 2

## 2023-08-01 MED ORDER — DIPHENHYDRAMINE HCL 25 MG PO CAPS
25.0000 mg | ORAL_CAPSULE | Freq: Once | ORAL | Status: AC
  Administered 2023-08-01: 25 mg via ORAL
  Filled 2023-08-01: qty 1

## 2023-08-01 NOTE — Progress Notes (Signed)
 Diagnosis: Iron Deficiency Anemia  Provider:  Chilton Greathouse MD  Procedure: IV Infusion  IV Type: Peripheral, IV Location: L Hand  Feraheme (Ferumoxytol), Dose: 510 mg  Infusion Start Time: 1509  Infusion Stop Time: 1530  Post Infusion IV Care: Observation period completed and Peripheral IV Discontinued  Discharge: Condition: Good, Destination: Home . AVS Declined  Performed by:  Adriana Mccallum, RN

## 2023-08-01 NOTE — Telephone Encounter (Signed)
 Auth Submission: NO AUTH NEEDED Site of care: Site of care: [facility] Payer: [employer] medicare Medication & CPT/J Code(s) submitted: Feraheme (ferumoxytol) [id number] Route of submission (phone, fax, portal):  Phone # Fax # Auth type: Buy/Bill PB Units/visits requested: 510mg  x 2 doses Reference number:  Approval from: 08/01/23 to 02/01/24

## 2023-08-25 DIAGNOSIS — I10 Essential (primary) hypertension: Secondary | ICD-10-CM | POA: Diagnosis not present

## 2023-08-25 DIAGNOSIS — E782 Mixed hyperlipidemia: Secondary | ICD-10-CM | POA: Diagnosis not present

## 2023-08-25 DIAGNOSIS — D509 Iron deficiency anemia, unspecified: Secondary | ICD-10-CM | POA: Diagnosis not present

## 2023-09-07 DIAGNOSIS — Z961 Presence of intraocular lens: Secondary | ICD-10-CM | POA: Diagnosis not present

## 2023-10-17 DIAGNOSIS — K8681 Exocrine pancreatic insufficiency: Secondary | ICD-10-CM | POA: Diagnosis not present

## 2023-11-30 ENCOUNTER — Encounter (HOSPITAL_COMMUNITY): Payer: Self-pay

## 2023-11-30 ENCOUNTER — Ambulatory Visit (HOSPITAL_COMMUNITY)
Admission: RE | Admit: 2023-11-30 | Discharge: 2023-11-30 | Disposition: A | Discharge status: Home / Self Care | Source: Ambulatory Visit | Attending: Family Medicine | Admitting: Family Medicine

## 2023-11-30 VITALS — BP 150/88 | HR 77 | Temp 98.5°F | Resp 18 | Ht <= 58 in | Wt 150.0 lb

## 2023-11-30 DIAGNOSIS — M62838 Other muscle spasm: Secondary | ICD-10-CM | POA: Diagnosis not present

## 2023-11-30 DIAGNOSIS — M542 Cervicalgia: Secondary | ICD-10-CM

## 2023-11-30 MED ORDER — DEXAMETHASONE SODIUM PHOSPHATE 10 MG/ML IJ SOLN
INTRAMUSCULAR | Status: AC
Start: 2023-11-30 — End: 2023-11-30
  Filled 2023-11-30: qty 1

## 2023-11-30 MED ORDER — DEXAMETHASONE SODIUM PHOSPHATE 10 MG/ML IJ SOLN
10.0000 mg | Freq: Once | INTRAMUSCULAR | Status: AC
  Administered 2023-11-30: 10 mg via INTRAMUSCULAR

## 2023-11-30 MED ORDER — TIZANIDINE HCL 4 MG PO TABS
4.0000 mg | ORAL_TABLET | Freq: Three times a day (TID) | ORAL | 0 refills | Status: AC | PRN
Start: 2023-11-30 — End: ?

## 2023-11-30 NOTE — Discharge Instructions (Signed)
 Meds ordered this encounter  Medications   dexamethasone  (DECADRON ) injection 10 mg   tiZANidine  (ZANAFLEX ) 4 MG tablet    Sig: Take 1 tablet (4 mg total) by mouth every 8 (eight) hours as needed for muscle spasms.    Dispense:  30 tablet    Refill:  0

## 2023-11-30 NOTE — ED Triage Notes (Signed)
 Patient presenting with neck pain into the back onset this past Sunday. States woke up with the pain, no known falls or injuries.   Prescriptions or OTC medications tried: Yes- heat, topical pain creams, Methocarbamol    with little relief

## 2023-11-30 NOTE — ED Provider Notes (Signed)
 [facility]   [id number] 11/30/23 Arrival Time: 1039  ASSESSMENT & PLAN:  1. Neck pain   2. Muscle spasms of neck    Stop Robaxin. Trial of: New Prescriptions   TIZANIDINE  (ZANAFLEX ) 4 MG TABLET    Take 1 tablet (4 mg total) by mouth every 8 (eight) hours as needed for muscle spasms.   Meds ordered this encounter  Medications   dexamethasone  (DECADRON ) injection 10 mg   Recommend:  Follow-up Information     Schedule an appointment as soon as possible for a visit  with Auston Opal, DO.   Specialty: Family Medicine Why: For a follow up. Contact information: [address] [place] [address] [phone]                  Reviewed expectations re: course of current medical issues. Questions answered. Outlined signs and symptoms indicating need for more acute intervention. Patient verbalized understanding. After Visit Summary given.  SUBJECTIVE: History from: patient. Angela Bond is a 78 y.o. female who reports neck pain into the upper back onset ; noted approx 4-5 d ago; more L sided. Denies injury/trauma. Robaxin without much relief.   Past Surgical History:  Procedure Laterality Date   BREAST BIOPSY Right    BREAST LUMPECTOMY Right 08/2014   BREAST LUMPECTOMY WITH SENTINEL LYMPH NODE BIOPSY Right 09/22/2014   Procedure: RIGHT BREAST LUMPECTOMY WITH NEEDLE LOCALIZATION AND RIGHT AXILLARY SENTINEL LYMPH NODE BIOPSY;  Surgeon: Deward Null III, MD;  Location: [facility] OR;  Service: General;  Laterality: Right;   CARDIAC CATHETERIZATION     CARDIAC CATHETERIZATION N/A 11/05/2015   Procedure: Left Heart Cath and Coronary Angiography;  Surgeon: Victory LELON Sharps, MD;  Location: [facility] INVASIVE CV LAB;  Service: Cardiovascular;  Laterality: N/A;   CARDIAC CATHETERIZATION N/A 11/05/2015   Procedure: Coronary Stent Intervention;  Surgeon: Victory LELON Sharps, MD;  Location: [facility] INVASIVE CV LAB;  Service: Cardiovascular;  Laterality: N/A;   CARPAL TUNNEL  RELEASE Right 09/2015   CHOLECYSTECTOMY N/A 03/28/2018   Procedure: LAPAROSCOPIC CHOLECYSTECTOMY WITH INTRAOPERATIVE CHOLANGIOGRAM ERAS PATHWAY;  Surgeon: Null Deward MOULD, MD;  Location: [facility] OR;  Service: General;  Laterality: N/A;   CORONARY ANGIOPLASTY WITH STENT PLACEMENT  11/05/2015   1 stent   ESOPHAGEAL MANOMETRY N/A 07/02/2012   Procedure: ESOPHAGEAL MANOMETRY (EM);  Surgeon: Jerrell KYM Sol, MD;  Location: [facility] ENDOSCOPY;  Service: Endoscopy;  Laterality: N/A;  schooler to read   EYE SURGERY Left 2013   tube placed tear duct   HIATAL HERNIA REPAIR N/A 08/10/2012   Procedure: LAPAROSCOPIC REPAIR OF HIATAL HERNIA WITH MESH AND EGD WITH PEG TUBE PLACEMENT;  Surgeon: Lynda Leos, MD;  Location: [facility] ORS;  Service: General;  Laterality: N/A;   LEFT HEART CATH AND CORONARY ANGIOGRAPHY N/A 08/01/2017   Procedure: LEFT HEART CATH AND CORONARY ANGIOGRAPHY;  Surgeon: Dann Candyce RAMAN, MD;  Location: [facility] INVASIVE CV LAB;  Service: Cardiovascular;  Laterality: N/A;   ORIF ELBOW FRACTURE Right 12/25/2018   ORIF HUMERUS FRACTURE Right 12/25/2018   Procedure: OPEN REDUCTION INTERNAL FIXATION (ORIF) DISTAL HUMERUS FRACTURE;  Surgeon: Celena Sharper, MD;  Location: [facility] OR;  Service: Orthopedics;  Laterality: Right;   PORT-A-CATH REMOVAL  06/2015   PORTACATH PLACEMENT Left 09/22/2014   Procedure: INSERTION PORT-A-CATH;  Surgeon: Deward Null III, MD;  Location: [facility] OR;  Service: General;  Laterality: Left;   POSTERIOR LUMBAR FUSION  2016   had 2 vertebrae fused   TONSILLECTOMY  as child  TOTAL KNEE ARTHROPLASTY Bilateral 2011,2012      OBJECTIVE:  Vitals:   11/30/23 1107  BP: (!) 150/88  Pulse: 77  Resp: 18  Temp: 98.5 F (36.9 C)  TempSrc: Oral  SpO2: 96%  Weight: 68 kg  Height: 4' 10 (1.473 m)    General appearance: alert; no distress HEENT: [place]; AT Neck: stiff and sore over L cervical musculature; denies midline TTP Resp: unlabored respirations Ext: moves all without issue Skin:  warm and dry; no visible rashes Neurologic: gait normal; normal sensation and strength of bilateral UE Psychological: alert and cooperative; normal mood and affect  Imaging: No results found.    Allergies  Allergen Reactions   Other Anaphylaxis, Nausea And Vomiting and Other (See Comments)    THROAT CLOSE, CAN'T BREATH ANAPHYLAXIS TO OYSTERS   Oyster Shell Anaphylaxis   Shellfish Allergy Anaphylaxis and Other (See Comments)    ANAPHYLAXIS TO OYSTERS   Aspirin  Other (See Comments)    Burns stomach   Penicillins Nausea And Vomiting and Other (See Comments)    PATIENT HAS HAD A PCN REACTION WITH IMMEDIATE RASH, FACIAL/TONGUE/THROAT SWELLING, SOB, OR LIGHTHEADEDNESS WITH HYPOTENSION:  #  #  YES  #  #  Has patient had a PCN reaction causing severe rash involving mucus membranes or skin necrosis: No Has patient had a PCN reaction that required hospitalization: No Has patient had a PCN reaction occurring within the last 10 years: No If all of the above answers are NO, then may proceed with Cephalosporin use.    Fosamax  [Alendronate ] Other (See Comments)   Brilinta  [Ticagrelor ] Other (See Comments)    Indigestion   Ciprofloxacin Nausea And Vomiting and Other (See Comments)   Colesevelam Other (See Comments)    Stomach cramps   Doxycycline Nausea And Vomiting   Evista [Raloxifene] Other (See Comments)    LEG ACHES   Fosamax  [Alendronate  Sodium] Other (See Comments)    Leg aches   Lactose Intolerance (Gi) Nausea And Vomiting   Latex Rash   Naproxen Nausea And Vomiting and Other (See Comments)   Naproxen Sodium Nausea And Vomiting   Nitrofurantoin  Rash and Other (See Comments)    I broke out down there   Pravachol  [Pravastatin ] Other (See Comments)    GI upset    Sulfa Antibiotics Nausea And Vomiting   Tape Rash   Welchol [Colesevelam Hcl] Other (See Comments)    Stomach cramps   Zocor [Simvastatin] Rash    Past Medical History:  Diagnosis Date   Anemia    hx    Anxiety and depression    Aortic atherosclerosis (HCC) 07/31/2017   Arthritis    ~ all over my body; in the joints   Breast cancer, right (HCC) 3/16   Coronary artery disease    a.  s/p NSTEMI in June 2017 with DES to mid LAD.   Depression    Gallstones    GERD (gastroesophageal reflux disease)    H/O hiatal hernia    Headache    Heart murmur    History of blood transfusion    when taking chemo & w/one of my knee replacements (11/05/2015)   Hypercholesteremia    Hypertension    Iron  deficiency anemia 02/16/2017   Multiple bruises    Myocardial infarction (HCC)    Orthostatic hypotension    Personal history of chemotherapy    Personal history of radiation therapy    Pneumonia X 1   Shingles 5/15   hx   Sliding hiatal  hernia s/p lap PEH repair 07/31/2012   Unstable angina (HCC) 01/23/2012   Unsteady gait     I fall easily   Wears dentures    partial   Wears glasses    Social History   Socioeconomic History   Marital status: Married    Spouse name: Not on file   Number of children: 1   Years of education: Not on file   Highest education level: Not on file  Occupational History   Not on file  Tobacco Use   Smoking status: Never   Smokeless tobacco: Never  Vaping Use   Vaping status: Never Used  Substance and Sexual Activity   Alcohol  use: No   Drug use: No   Sexual activity: Not Currently    Comment: 1st intercourse 75 yo-2 partners  Other Topics Concern   Not on file  Social History Narrative   Lives with husband.  Does not use assist device.     Social Drivers of Health   Financial Resource Strain: Medium Risk (12/15/2021)   Overall Financial Resource Strain (CARDIA)    Difficulty of Paying Living Expenses: Somewhat hard  Food Insecurity: Food Insecurity Present (12/30/2021)   Hunger Vital Sign    Worried About Running Out of Food in the Last Year: Sometimes true    Ran Out of Food in the Last Year: Sometimes true  Transportation Needs: No  Transportation Needs (12/15/2021)   PRAPARE - Administrator, Civil Service (Medical): No    Lack of Transportation (Non-Medical): No  Physical Activity: Not on file  Stress: Stress Concern Present (12/15/2021)   [employer] of Occupational Health - Occupational Stress Questionnaire    Feeling of Stress : To some extent  Social Connections: Moderately Integrated (12/15/2021)   Social Connection and Isolation Panel    Frequency of Communication with Friends and Family: More than three times a week    Frequency of Social Gatherings with Friends and Family: More than three times a week    Attends Religious Services: More than 4 times per year    Active Member of [employer] or Organizations: Yes    Attends Banker Meetings: More than 4 times per year    Marital Status: Widowed   Family History  Problem Relation Age of Onset   Heart disease Mother        56s, CHF   Heart disease Father        d/o MI at 45   Cancer Father        skin   Diabetes Father    Cancer Sister 40       breast   Diabetes Sister    Heart disease Sister        CABG in mid-60s   Past Surgical History:  Procedure Laterality Date   BREAST BIOPSY Right    BREAST LUMPECTOMY Right 08/2014   BREAST LUMPECTOMY WITH SENTINEL LYMPH NODE BIOPSY Right 09/22/2014   Procedure: RIGHT BREAST LUMPECTOMY WITH NEEDLE LOCALIZATION AND RIGHT AXILLARY SENTINEL LYMPH NODE BIOPSY;  Surgeon: Deward Null III, MD;  Location: [facility] OR;  Service: General;  Laterality: Right;   CARDIAC CATHETERIZATION     CARDIAC CATHETERIZATION N/A 11/05/2015   Procedure: Left Heart Cath and Coronary Angiography;  Surgeon: Victory LELON Sharps, MD;  Location: [facility] INVASIVE CV LAB;  Service: Cardiovascular;  Laterality: N/A;   CARDIAC CATHETERIZATION N/A 11/05/2015   Procedure: Coronary Stent Intervention;  Surgeon: Victory LELON Sharps, MD;  Location: [facility] INVASIVE  CV LAB;  Service: Cardiovascular;  Laterality: N/A;   CARPAL TUNNEL RELEASE Right 09/2015    CHOLECYSTECTOMY N/A 03/28/2018   Procedure: LAPAROSCOPIC CHOLECYSTECTOMY WITH INTRAOPERATIVE CHOLANGIOGRAM ERAS PATHWAY;  Surgeon: Curvin Deward MOULD, MD;  Location: [facility] OR;  Service: General;  Laterality: N/A;   CORONARY ANGIOPLASTY WITH STENT PLACEMENT  11/05/2015   1 stent   ESOPHAGEAL MANOMETRY N/A 07/02/2012   Procedure: ESOPHAGEAL MANOMETRY (EM);  Surgeon: Jerrell KYM Sol, MD;  Location: [facility] ENDOS[facility];  Service: Endoscopy;  Laterality: N/A;  schooler to read   EYE SURGERY Left 2013   tube placed tear duct   HIATAL HERNIA REPAIR N/A 08/10/2012   Procedure: LAPAROSCOPIC REPAIR OF HIATAL HERNIA WITH MESH AND EGD WITH PEG TUBE PLACEMENT;  Surgeon: Lynda Leos, MD;  Location: [facility] ORS;  Service: General;  Laterality: N/A;   LEFT HEART CATH AND CORONARY ANGIOGRAPHY N/A 08/01/2017   Procedure: LEFT HEART CATH AND CORONARY ANGIOGRAPHY;  Surgeon: Dann Candyce RAMAN, MD;  Location: [facility] INVASIVE CV LAB;  Service: Cardiovascular;  Laterality: N/A;   ORIF ELBOW FRACTURE Right 12/25/2018   ORIF HUMERUS FRACTURE Right 12/25/2018   Procedure: OPEN REDUCTION INTERNAL FIXATION (ORIF) DISTAL HUMERUS FRACTURE;  Surgeon: Celena Sharper, MD;  Location: [facility] OR;  Service: Orthopedics;  Laterality: Right;   PORT-A-CATH REMOVAL  06/2015   PORTACATH PLACEMENT Left 09/22/2014   Procedure: INSERTION PORT-A-CATH;  Surgeon: Deward Curvin III, MD;  Location: [facility] OR;  Service: General;  Laterality: Left;   POSTERIOR LUMBAR FUSION  2016   had 2 vertebrae fused   TONSILLECTOMY  as child   TOTAL KNEE ARTHROPLASTY Bilateral [id number]       Rolinda Rogue, MD 11/30/23 1132

## 2023-12-03 ENCOUNTER — Emergency Department (HOSPITAL_COMMUNITY)
Admission: EM | Admit: 2023-12-03 | Discharge: 2023-12-03 | Disposition: A | Discharge status: Home / Self Care | Attending: Emergency Medicine | Admitting: Emergency Medicine

## 2023-12-03 ENCOUNTER — Other Ambulatory Visit: Payer: Self-pay

## 2023-12-03 ENCOUNTER — Emergency Department (HOSPITAL_COMMUNITY)

## 2023-12-03 ENCOUNTER — Encounter (HOSPITAL_COMMUNITY): Payer: Self-pay | Admitting: Emergency Medicine

## 2023-12-03 DIAGNOSIS — M436 Torticollis: Secondary | ICD-10-CM | POA: Insufficient documentation

## 2023-12-03 DIAGNOSIS — M50222 Other cervical disc displacement at C5-C6 level: Secondary | ICD-10-CM | POA: Diagnosis not present

## 2023-12-03 DIAGNOSIS — H538 Other visual disturbances: Secondary | ICD-10-CM | POA: Diagnosis not present

## 2023-12-03 DIAGNOSIS — R110 Nausea: Secondary | ICD-10-CM | POA: Diagnosis not present

## 2023-12-03 DIAGNOSIS — R42 Dizziness and giddiness: Secondary | ICD-10-CM | POA: Diagnosis not present

## 2023-12-03 DIAGNOSIS — M50223 Other cervical disc displacement at C6-C7 level: Secondary | ICD-10-CM | POA: Diagnosis not present

## 2023-12-03 DIAGNOSIS — R519 Headache, unspecified: Secondary | ICD-10-CM | POA: Diagnosis not present

## 2023-12-03 DIAGNOSIS — M50321 Other cervical disc degeneration at C4-C5 level: Secondary | ICD-10-CM | POA: Diagnosis not present

## 2023-12-03 DIAGNOSIS — Z91040 Latex allergy status: Secondary | ICD-10-CM | POA: Insufficient documentation

## 2023-12-03 DIAGNOSIS — Z7982 Long term (current) use of aspirin: Secondary | ICD-10-CM | POA: Insufficient documentation

## 2023-12-03 DIAGNOSIS — M47812 Spondylosis without myelopathy or radiculopathy, cervical region: Secondary | ICD-10-CM | POA: Diagnosis not present

## 2023-12-03 LAB — CBC WITH DIFFERENTIAL/PLATELET
Abs Immature Granulocytes: 0.02 K/uL (ref 0.00–0.07)
Basophils Absolute: 0 K/uL (ref 0.0–0.1)
Basophils Relative: 0 %
Eosinophils Absolute: 0.1 K/uL (ref 0.0–0.5)
Eosinophils Relative: 2 %
HCT: 40.8 % (ref 36.0–46.0)
Hemoglobin: 13.4 g/dL (ref 12.0–15.0)
Immature Granulocytes: 0 %
Lymphocytes Relative: 20 %
Lymphs Abs: 1.4 K/uL (ref 0.7–4.0)
MCH: 31.5 pg (ref 26.0–34.0)
MCHC: 32.8 g/dL (ref 30.0–36.0)
MCV: 95.8 fL (ref 80.0–100.0)
Monocytes Absolute: 0.5 K/uL (ref 0.1–1.0)
Monocytes Relative: 8 %
Neutro Abs: 4.7 K/uL (ref 1.7–7.7)
Neutrophils Relative %: 70 %
Platelets: 220 K/uL (ref 150–400)
RBC: 4.26 MIL/uL (ref 3.87–5.11)
RDW: 13.1 % (ref 11.5–15.5)
WBC: 6.7 K/uL (ref 4.0–10.5)
nRBC: 0 % (ref 0.0–0.2)

## 2023-12-03 LAB — COMPREHENSIVE METABOLIC PANEL WITH GFR
ALT: 11 U/L (ref 0–44)
AST: 17 U/L (ref 15–41)
Albumin: 3.2 g/dL — ABNORMAL LOW (ref 3.5–5.0)
Alkaline Phosphatase: 58 U/L (ref 38–126)
Anion gap: 11 (ref 5–15)
BUN: 8 mg/dL (ref 8–23)
CO2: 28 mmol/L (ref 22–32)
Calcium: 8.8 mg/dL — ABNORMAL LOW (ref 8.9–10.3)
Chloride: 100 mmol/L (ref 98–111)
Creatinine, Ser: 0.71 mg/dL (ref 0.44–1.00)
GFR, Estimated: >60 mL/min (ref >60)
Glucose, Bld: 98 mg/dL (ref 70–99)
Potassium: 3.9 mmol/L (ref 3.5–5.1)
Sodium: 139 mmol/L (ref 135–145)
Total Bilirubin: 1.1 mg/dL (ref 0.0–1.2)
Total Protein: 7.9 g/dL (ref 6.5–8.1)

## 2023-12-03 LAB — TROPONIN I (HIGH SENSITIVITY): Troponin I (High Sensitivity): 10 ng/L (ref <18)

## 2023-12-03 MED ORDER — ACETAMINOPHEN 500 MG PO TABS
1000.0000 mg | ORAL_TABLET | Freq: Once | ORAL | Status: AC
  Administered 2023-12-03: 1000 mg via ORAL
  Filled 2023-12-03: qty 2

## 2023-12-03 MED ORDER — TRAMADOL HCL 50 MG PO TABS: 50.0000 mg | ORAL_TABLET | Freq: Four times a day (QID) | ORAL | 0 refills | Status: AC | PRN

## 2023-12-03 NOTE — Discharge Instructions (Addendum)
 1.  You were given a steroid shot called Decadron  at the urgent care on 7\17.  This should still be helping with inflammation and pain in your neck.  You can also continue taking extra strength Tylenol  every 6 hours for pain.  I have also prescribed tramadol  to add to the Tylenol .  You may take 1 tablet of tramadol  when you take your dose of Tylenol .  You may also use warm moist heat and over-the-counter pain patches. 2.  Schedule a follow-up appointment with your family doctor for recheck.  You may need further treatment if you continue to have pain.  You have been seen in the past at [facility].  Call them to schedule a follow-up appointment.  If you need a repeat referral, talk to your family doctor about doing this as soon as possible. 3.  You had an MRI of your brain and your neck done in the emergency department.  The MRI did not show any areas of new strokes and it also did not show areas of compression in your spinal cord that would require immediate surgery.  You have a lot of arthritic changes and nerves that have pressure on them but this is appropriate for you to discuss with your doctor in [facility] for other treatments they might be able to offer.

## 2023-12-03 NOTE — ED Triage Notes (Signed)
 Pt BIB [facility] with reports of dizziness that started this morning upon waking. Pt reports she also has had midline neck pain x 1 week. No unilateral weakness noted. A&O x 4 at this time.

## 2023-12-03 NOTE — ED Provider Notes (Signed)
 [facility] AT [facility] Provider Note   CSN: [id number] Arrival date & time: 12/03/23  9172     Patient presents with: Dizziness   Angela [facility] is a 78 y.o. female.   HPI Patient has severe neck stiffness and pain that started about a week ago.  She reports this in the center of her neck.  Initially she could not turn her head at all.  She was seen at urgent care and given a steroid and a muscle relaxer.  After she took the muscle relaxer she got dizziness and blurred vision.  EMS checked her the following day and told her not to take anymore muscle relaxers.  She now has not had a muscle relaxer for 2 days and the symptoms seem to resolve.  Today however she awakened and she was very dizzy and had difficulty walking due to incoordination.  Reports her vision also seemed blurred.  Patient did not have focal weakness or numbness of extremities.  She reports both upper extremities were functional.  She reports that the neck pain is still there but she has had some increased range of motion since starting the steroid.  No fevers no chills.  No chest pain.  She reports she does have some posterior headache.  This has been associated with the neck pain.    Prior to Admission medications   Medication Sig Start Date End Date Taking? Authorizing Provider  traMADol  (ULTRAM ) 50 MG tablet Take 1 tablet (50 mg total) by mouth every 6 (six) hours as needed. 12/03/23  Yes Armenta Canning, MD  ALPRAZolam  (XANAX ) 0.25 MG tablet Take 0.25 mg by mouth 2 (two) times daily as needed. 10/15/19   [provider]  aspirin  EC 81 MG EC tablet Take 1 tablet (81 mg total) by mouth daily. 11/06/15   Marcine Catalan M, PA-C  calcium  carbonate (OSCAL) 1500 (600 Ca) MG TABS tablet Take 1 tablet by mouth daily.    [provider]  Cholecalciferol  (VITAMIN D ) 2000 UNITS tablet Take 2,000 Units by mouth daily.    [provider]  CREON 36000-114000 units CPEP  capsule 2 capsules with each meal and 1 capsule with each snack Orally with food for 90 days 10/14/22   [provider]  escitalopram  (LEXAPRO ) 20 MG tablet Take 1 tablet (20 mg total) by mouth daily. 12/08/21   Gudena, Vinay, MD  famotidine  (PEPCID ) 20 MG tablet Take 20 mg by mouth at bedtime.  08/31/18   [provider]  fexofenadine (ALLEGRA) 180 MG tablet Take 180 mg by mouth daily as needed for allergies.     [provider]  fluticasone  (FLONASE ) 50 MCG/ACT nasal spray Place 2 sprays into both nostrils daily. 04/05/23   Rising, Rebecca, PA-C  Magnesium  500 MG TABS Take 500 mg by mouth at bedtime.    [provider]  nitroGLYCERIN  (NITROSTAT ) 0.4 MG SL tablet Place 1 tablet (0.4 mg total) under the tongue every 5 (five) minutes as needed for chest pain (CP or SOB). 09/07/18   Jeffrie Oneil BROCKS, MD  pantoprazole  (PROTONIX ) 40 MG tablet 1 tablet daily. 11/07/22   [provider]  Polyvinyl Alcohol -Povidone PF (REFRESH) 1.4-0.6 % SOLN Place 2 drops into both eyes 3 (three) times daily as needed (for dry eyes).    [provider]  tiZANidine  (ZANAFLEX ) 4 MG tablet Take 1 tablet (4 mg total) by mouth every 8 (eight) hours as needed for muscle spasms. 11/30/23   Rolinda Rogue,  MD  vitamin C  (ASCORBIC ACID ) 500 MG tablet Take 500 mg by mouth daily.    [provider]    Allergies: Other, Oyster shell, Shellfish allergy, Aspirin , Penicillins, Fosamax  [alendronate ], Brilinta  [ticagrelor ], Ciprofloxacin, Colesevelam, Doxycycline, Evista [raloxifene], Fosamax  [alendronate  sodium], Lactose intolerance (gi), Latex, Naproxen, Naproxen sodium, Nitrofurantoin , Pravachol  [pravastatin ], Sulfa antibiotics, Tape, Welchol [colesevelam hcl], and Zocor [simvastatin]    Review of Systems  Updated Vital Signs BP (!) 148/63   Pulse (!) 58   Temp 98.3 F (36.8 C) (Oral)   Resp 16   SpO2 100%   Physical Exam Constitutional:      Comments: Patient is alert  nontoxic with clear mental status.  HENT:     Head: Normocephalic and atraumatic.     Nose: Nose normal.     Mouth/Throat:     Mouth: Mucous membranes are moist.     Pharynx: Oropharynx is clear.  Eyes:     Extraocular Movements: Extraocular movements intact.     Conjunctiva/sclera: Conjunctivae normal.     Pupils: Pupils are equal, round, and reactive to light.  Neck:     Comments: Patient Dors is fairly significant tenderness to palpation both in the midline and paraspinous muscle bodies of the neck and trapezius.  Anterior neck soft and supple.  Range of motion is limited but with rotation of approximately 40 degrees off midline in both directions. Cardiovascular:     Rate and Rhythm: Normal rate and regular rhythm.  Pulmonary:     Effort: Pulmonary effort is normal.     Breath sounds: Normal breath sounds.  Abdominal:     General: There is no distension.     Palpations: Abdomen is soft.     Tenderness: There is no abdominal tenderness. There is no guarding.  Musculoskeletal:        General: No swelling or tenderness. Normal range of motion.     Right lower leg: No edema.     Left lower leg: No edema.  Skin:    General: Skin is warm and dry.  Neurological:     General: No focal deficit present.     Mental Status: She is oriented to person, place, and time.     Cranial Nerves: No cranial nerve deficit.     Motor: No weakness.     Coordination: Coordination normal.  Psychiatric:        Mood and Affect: Mood normal.     (all labs ordered are listed, but only abnormal results are displayed) Labs Reviewed  CBC WITH DIFFERENTIAL/PLATELET  COMPREHENSIVE METABOLIC PANEL WITH GFR  TROPONIN I (HIGH SENSITIVITY)  TROPONIN I (HIGH SENSITIVITY)    EKG: None  Radiology: MR BRAIN WO CONTRAST Result Date: 12/03/2023 CLINICAL DATA:  Provided history: Neuro deficit, acute, stroke suspected. Ataxia, nontraumatic, cervical pathology suspected. EXAM: MRI HEAD WITHOUT CONTRAST MRI  CERVICAL SPINE WITHOUT CONTRAST TECHNIQUE: Multiplanar, multiecho pulse sequences of the brain and surrounding structures, and cervical spine, to include the craniocervical junction and cervicothoracic junction, were obtained without intravenous contrast. COMPARISON:  Head CT 02/27/2023. Brain MRI 08/03/2011. Cervical spine MRI 05/07/2017. FINDINGS: MRI HEAD FINDINGS Brain: Mild generalized cerebral atrophy. Punctate chronic parenchymal microhemorrhage along the right sylvian fissure. Punctate chronic microhemorrhage within the medial left cerebellar hemisphere. No cortical encephalomalacia is identified. No significant cerebral white matter disease for age. There is no acute infarct. No evidence of an intracranial mass. No extra-axial fluid collection. No midline shift. Vascular: Maintained flow voids within the proximal large arterial vessels. Skull  and upper cervical spine: No focal worrisome marrow lesion. Sinuses/Orbits: No mass or acute finding within the imaged orbits. Prior bilateral ocular lens replacement. 16 mm right maxillary sinus mucous retention cyst. MRI CERVICAL SPINE FINDINGS Alignment: Grade 1 anterolisthesis at C3-C4, C4-C5 and C7-T1. Vertebrae: Cervical vertebral body height is maintained. No significant marrow edema or focal worrisome marrow lesion. Facet ankylosis bilaterally at C4-C5, new from the prior MRI. Cord: No signal abnormality identified within the cervical spinal cord. Posterior Fossa, vertebral arteries, paraspinal tissues: Posterior fossa assessed on same-day brain MRI. Flow voids preserved within visible portions of the cervical vertebral arteries. No paraspinal mass or collection. Disc levels: Unless otherwise stated, the level by level findings below have not significantly changed from the prior MRI of 05/07/2017. Progressive multilevel disc degeneration, greatest at C4-C5 and C5-C6 (moderate at these levels). Small atlantooccipital joint effusion on the left. C2-C3:  Progressive facet arthropathy. Ligamentum flavum thickening, new. No significant disc herniation or stenosis. C3-C4: Grade 1 anterolisthesis. Facet arthropathy (greater on the left and progressed on the left). Ligamentum flavum thickening, new. No significant disc herniation or stenosis. C4-C5: Grade 1 anterolisthesis. Bilateral facet ankylosis, new from the prior MRI. No significant disc herniation or stenosis. C5-C6: Disc bulge with right-sided disc osteophyte ridge/uncinate hypertrophy, progressed. Facet arthropathy (greater on the left and advanced on the left). Mild ligamentum flavum thickening. The disc bulge mildly effaces the ventral thecal sac (without significant spinal canal stenosis). Moderate right neural foraminal narrowing, new. C6-C7: Slight disc bulge. Mild facet arthropathy (greater on the left). Ligamentum flavum thickening, new. Thickened ligamentum flavum mildly effaces the thecal sac posteriorly, without significant spinal canal stenosis. No significant foraminal stenosis. C7-T1: Grade 1 anterolisthesis. No significant disc herniation or stenosis. IMPRESSION: MRI brain: 1.  No evidence of an acute intracranial abnormality. 2. Punctate chronic microhemorrhages along the right sylvian fissure and within the left cerebellar hemisphere. 3. Mild generalized cerebral atrophy. 4. Otherwise unremarkable non-contrast MRI appearance of the brain. 5. 16 mm right maxillary sinus mucous retention cyst. MRI cervical spine: 1. Cervical spondylosis as outlined within the body of the report and having progressed at multiple levels since the prior MRI of 05/07/2017. 2. No significant focal stenosis. 3. Moderate neural foraminal narrowing on the right at C5-C6, new from the prior MRI 4. Disc degeneration is greatest at C4-C5 and C5-C6 (moderate at these levels). 5. Multilevel facet arthropathy. Bilateral C4-C5 facet ankylosis, new from the prior MRI. 6. Grade 1 anterolisthesis at C3-C4, C4-C5 and C7-T1. 7. Small  atlantooccipital joint effusion on the left. Electronically Signed   By: Rockey Childs D.O.   On: 12/03/2023 12:06   MR Cervical Spine Wo Contrast Result Date: 12/03/2023 CLINICAL DATA:  Provided history: Neuro deficit, acute, stroke suspected. Ataxia, nontraumatic, cervical pathology suspected. EXAM: MRI HEAD WITHOUT CONTRAST MRI CERVICAL SPINE WITHOUT CONTRAST TECHNIQUE: Multiplanar, multiecho pulse sequences of the brain and surrounding structures, and cervical spine, to include the craniocervical junction and cervicothoracic junction, were obtained without intravenous contrast. COMPARISON:  Head CT 02/27/2023. Brain MRI 08/03/2011. Cervical spine MRI 05/07/2017. FINDINGS: MRI HEAD FINDINGS Brain: Mild generalized cerebral atrophy. Punctate chronic parenchymal microhemorrhage along the right sylvian fissure. Punctate chronic microhemorrhage within the medial left cerebellar hemisphere. No cortical encephalomalacia is identified. No significant cerebral white matter disease for age. There is no acute infarct. No evidence of an intracranial mass. No extra-axial fluid collection. No midline shift. Vascular: Maintained flow voids within the proximal large arterial vessels. Skull and upper cervical spine: No focal  worrisome marrow lesion. Sinuses/Orbits: No mass or acute finding within the imaged orbits. Prior bilateral ocular lens replacement. 16 mm right maxillary sinus mucous retention cyst. MRI CERVICAL SPINE FINDINGS Alignment: Grade 1 anterolisthesis at C3-C4, C4-C5 and C7-T1. Vertebrae: Cervical vertebral body height is maintained. No significant marrow edema or focal worrisome marrow lesion. Facet ankylosis bilaterally at C4-C5, new from the prior MRI. Cord: No signal abnormality identified within the cervical spinal cord. Posterior Fossa, vertebral arteries, paraspinal tissues: Posterior fossa assessed on same-day brain MRI. Flow voids preserved within visible portions of the cervical vertebral arteries. No  paraspinal mass or collection. Disc levels: Unless otherwise stated, the level by level findings below have not significantly changed from the prior MRI of 05/07/2017. Progressive multilevel disc degeneration, greatest at C4-C5 and C5-C6 (moderate at these levels). Small atlantooccipital joint effusion on the left. C2-C3: Progressive facet arthropathy. Ligamentum flavum thickening, new. No significant disc herniation or stenosis. C3-C4: Grade 1 anterolisthesis. Facet arthropathy (greater on the left and progressed on the left). Ligamentum flavum thickening, new. No significant disc herniation or stenosis. C4-C5: Grade 1 anterolisthesis. Bilateral facet ankylosis, new from the prior MRI. No significant disc herniation or stenosis. C5-C6: Disc bulge with right-sided disc osteophyte ridge/uncinate hypertrophy, progressed. Facet arthropathy (greater on the left and advanced on the left). Mild ligamentum flavum thickening. The disc bulge mildly effaces the ventral thecal sac (without significant spinal canal stenosis). Moderate right neural foraminal narrowing, new. C6-C7: Slight disc bulge. Mild facet arthropathy (greater on the left). Ligamentum flavum thickening, new. Thickened ligamentum flavum mildly effaces the thecal sac posteriorly, without significant spinal canal stenosis. No significant foraminal stenosis. C7-T1: Grade 1 anterolisthesis. No significant disc herniation or stenosis. IMPRESSION: MRI brain: 1.  No evidence of an acute intracranial abnormality. 2. Punctate chronic microhemorrhages along the right sylvian fissure and within the left cerebellar hemisphere. 3. Mild generalized cerebral atrophy. 4. Otherwise unremarkable non-contrast MRI appearance of the brain. 5. 16 mm right maxillary sinus mucous retention cyst. MRI cervical spine: 1. Cervical spondylosis as outlined within the body of the report and having progressed at multiple levels since the prior MRI of 05/07/2017. 2. No significant focal  stenosis. 3. Moderate neural foraminal narrowing on the right at C5-C6, new from the prior MRI 4. Disc degeneration is greatest at C4-C5 and C5-C6 (moderate at these levels). 5. Multilevel facet arthropathy. Bilateral C4-C5 facet ankylosis, new from the prior MRI. 6. Grade 1 anterolisthesis at C3-C4, C4-C5 and C7-T1. 7. Small atlantooccipital joint effusion on the left. Electronically Signed   By: Rockey Childs D.O.   On: 12/03/2023 12:06     Procedures   Medications Ordered in the ED  acetaminophen  (TYLENOL ) tablet 1,000 mg (has no administration in time range)                                    Medical Decision Making Amount and/or Complexity of Data Reviewed Labs: ordered. Radiology: ordered.  Risk OTC drugs. Prescription drug management.   Patient presents as outlined.  She was diagnosed with torticollis in urgent care.  She reports her neck was so stiff she could not turn it at all.  Today she has improved range of motion.  Patient however has had 2 episodes of vertiginous quality symptoms and some blurred vision.  The first episode seem to be due to the muscle relaxer she had taken.  Patient however reports she has not taken  any repeat dosing in 2 days.  Today she again had a vertiginous quality symptom with gait and coordination.  Will proceed with MRI cervical spine and head for possible stroke or cervical impingement.  Clinically patient is well in appearance.  She is nontoxic and alert.  She has no distress.  Generally speaking, the pain and range of motion seem to be improving in her neck.  She still complains of pain but clearly has improved range of motion.  MRI brain and cervical spine reviewed by radiology shows no acute CVA or acute intracranial process.  Multiple level degenerative disease and facet arthropathy but no cord impingement or emergent condition identified.  Patient reassessed.  She is clinically well in appearance.  Patient had difficult access and we were  unable to obtain lab work.  At this time I do not feel that lab work is urgent for her presentation.  Patient is clinically well in appearance with no signs of dehydration or electrolyte derangement to explain her symptoms.  She is symptom-free now in terms of any dizziness, weakness or visual disturbance.  I explained to the patient she should still be getting effects from the Decadron  which she had gotten in terms of her neck which seems to improving.  She reports she still has pain with range of motion and at this time we will continue acetaminophen  and add tramadol .  Patient reports she has in the past taken Percocet for pain when she had her knees replaced however that was a long time ago.  At this time as she seems relatively comfortable will see if acetaminophen  and tramadol  are sufficient.  We discussed follow-up with [facility].  She has been seen by them a number of years ago.  She will also contact her PCP for follow-up.     Final diagnoses:  Torticollis  Vertigo    ED Discharge Orders          Ordered    traMADol  (ULTRAM ) 50 MG tablet  Every 6 hours PRN        12/03/23 1312               Armenta Canning, MD 12/03/23 1323

## 2023-12-03 NOTE — ED Notes (Signed)
 Unable to get labs ?

## 2023-12-03 NOTE — ED Notes (Signed)
 Patient transported to MRI

## 2023-12-07 DIAGNOSIS — M542 Cervicalgia: Secondary | ICD-10-CM | POA: Diagnosis not present

## 2024-01-09 ENCOUNTER — Inpatient Hospital Stay: Payer: Medicare Other | Attending: Hematology and Oncology

## 2024-01-09 DIAGNOSIS — C50411 Malignant neoplasm of upper-outer quadrant of right female breast: Secondary | ICD-10-CM | POA: Insufficient documentation

## 2024-01-09 DIAGNOSIS — Z853 Personal history of malignant neoplasm of breast: Secondary | ICD-10-CM | POA: Insufficient documentation

## 2024-01-09 DIAGNOSIS — K909 Intestinal malabsorption, unspecified: Secondary | ICD-10-CM | POA: Insufficient documentation

## 2024-01-09 DIAGNOSIS — G629 Polyneuropathy, unspecified: Secondary | ICD-10-CM | POA: Insufficient documentation

## 2024-01-09 DIAGNOSIS — D509 Iron deficiency anemia, unspecified: Secondary | ICD-10-CM | POA: Diagnosis not present

## 2024-01-09 DIAGNOSIS — K859 Acute pancreatitis without necrosis or infection, unspecified: Secondary | ICD-10-CM | POA: Insufficient documentation

## 2024-01-09 DIAGNOSIS — Z923 Personal history of irradiation: Secondary | ICD-10-CM | POA: Insufficient documentation

## 2024-01-09 DIAGNOSIS — Z9221 Personal history of antineoplastic chemotherapy: Secondary | ICD-10-CM | POA: Diagnosis not present

## 2024-01-09 LAB — CBC WITH DIFFERENTIAL (CANCER CENTER ONLY)
Abs Immature Granulocytes: 0.02 K/uL (ref 0.00–0.07)
Basophils Absolute: 0 K/uL (ref 0.0–0.1)
Basophils Relative: 0 %
Eosinophils Absolute: 0.3 K/uL (ref 0.0–0.5)
Eosinophils Relative: 4 %
HCT: 37.1 % (ref 36.0–46.0)
Hemoglobin: 12.4 g/dL (ref 12.0–15.0)
Immature Granulocytes: 0 %
Lymphocytes Relative: 25 %
Lymphs Abs: 1.8 K/uL (ref 0.7–4.0)
MCH: 30.9 pg (ref 26.0–34.0)
MCHC: 33.4 g/dL (ref 30.0–36.0)
MCV: 92.5 fL (ref 80.0–100.0)
Monocytes Absolute: 0.5 K/uL (ref 0.1–1.0)
Monocytes Relative: 7 %
Neutro Abs: 4.6 K/uL (ref 1.7–7.7)
Neutrophils Relative %: 64 %
Platelet Count: 207 K/uL (ref 150–400)
RBC: 4.01 MIL/uL (ref 3.87–5.11)
RDW: 12.9 % (ref 11.5–15.5)
WBC Count: 7.2 K/uL (ref 4.0–10.5)
nRBC: 0 % (ref 0.0–0.2)

## 2024-01-09 LAB — IRON AND IRON BINDING CAPACITY (CC-WL,HP ONLY)
Iron: 88 ug/dL (ref 28–170)
Saturation Ratios: 30 % (ref 10.4–31.8)
TIBC: 293 ug/dL (ref 250–450)
UIBC: 205 ug/dL (ref 148–442)

## 2024-01-09 LAB — FERRITIN: Ferritin: 196 ng/mL (ref 11–307)

## 2024-01-10 NOTE — Assessment & Plan Note (Signed)
 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   Lab review: 01/19/2022: Hemoglobin 12.9, ferritin 144, iron  saturation 27% 08/03/2022: Hemoglobin 13.2, MCV 88.9, iron  saturation 23%, ferritin 49 (no need of IV iron ) 02/16/2023: Hemoglobin 12, MCV 90.1, iron  saturation 19%, ferritin 20 (no need of IV iron ) 07/10/2023: Hemoglobin 11.1, MCV 83, iron  saturation 11%, ferritin 10 01/09/2024: Hemoglobin 12.4, MCV 92.5, iron  saturation 30%, ferritin 196   Will need of additional IV iron  therapy Peripheral neuropathy: Slowly improving.   Pancreatitis: Unable to take creon due to cost (having diarrhea).     Recheck labs in 6 months and telephone visit follow-up after that

## 2024-01-10 NOTE — Assessment & Plan Note (Addendum)
 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  Breast exam on 12/08/2021: Benign no palpable lumps or nodules 2.  Mammogram 05/19/2023: Benign breast density category C  3.  Bone density 05/30/2023: T score -2.5: Osteoporosis: I recommend calcium  vitamin D  weightbearing exercises and bisphosphonate therapy (managed by her PCP)

## 2024-01-11 ENCOUNTER — Inpatient Hospital Stay: Payer: Medicare Other | Admitting: Hematology and Oncology

## 2024-01-11 DIAGNOSIS — D509 Iron deficiency anemia, unspecified: Secondary | ICD-10-CM

## 2024-01-11 DIAGNOSIS — C50411 Malignant neoplasm of upper-outer quadrant of right female breast: Secondary | ICD-10-CM

## 2024-01-11 DIAGNOSIS — Z171 Estrogen receptor negative status [ER-]: Secondary | ICD-10-CM | POA: Diagnosis not present

## 2024-01-11 NOTE — Progress Notes (Signed)
 HEMATOLOGY-ONCOLOGY TELEPHONE VISIT PROGRESS NOTE  I connected with our patient on 01/11/24 at  8:30 AM EDT by telephone and verified that I am speaking with the correct person using two identifiers.  I discussed the limitations, risks, security and privacy concerns of performing an evaluation and management service by telephone and the availability of in person appointments.  I also discussed with the patient that there may be a patient responsible charge related to this service. The patient expressed understanding and agreed to proceed.   History of Present Illness: Follow-up to review the results of iron  studies  History of Present Illness Angela [facility] is a 78 year old female who presents for follow-up of her iron  levels.  Her ferritin levels have increased from 10 to 196 since February. She has made dietary changes to support this improvement. She reports no new problems or concerns.  She has been eating more iron  containing food.  She denies any lumps or nodules in the breast.   Oncology History  Breast cancer of upper-outer quadrant of right female breast (HCC)  07/30/2014 Mammogram   Right breast: Mass at 10 o'clock location 4 cm from the nipple measuring 1.7 x 1.3 x 1.3 cm. This corresponds to the palpable and mammographic finding. In the right axilla, there are several lymph nodes which are normal in size measuring up to 4mm.   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%), PR- (0%), HER2/neu negative, Ki67 30%   08/27/2014 Clinical Stage   Stage IIA: T2 N0   09/22/2014 Definitive Surgery   Right lumpectomy/[facility] Osker): Invasive ductal carcinoma, grade 3, 2.6 cm, high grade DCIS, LVI present, ER-, PR-, 2 LN removed and 1 positive for metastatic carcinoma (1/2)   09/22/2014 Pathologic Stage   Stage IIB: T2 N1 M0   10/15/2014 - 12/16/2014 Chemotherapy   Adjuvant chemotherapy with Taxotere  Cytoxan  4 q3 weeks   10/21/2014  - 10/24/2014 Hospital Admission   Admission for neutropenic fever UTI Klebsiella   01/26/2015 - 03/11/2015 Radiation Therapy   Adjuvant RT Signe): Right breast / 45 Gray @ 1.8 [place] per fraction x 25 fractions Right supraclavicular fossa/PAB  45 Gy @1 .8 Gy per fraction x 25 fractions Right breast boost / 16 Gray at [employer] per fraction x 8 fractions   05/19/2015 Survivorship   Survivorship care plan visit completed.   03/28/2018 Surgery   Laparoscopic cholecystectomy     REVIEW OF SYSTEMS:   Constitutional: Denies fevers, chills or abnormal weight loss All other systems were reviewed with the patient and are negative. Observations/Objective:     Assessment Plan:  Breast cancer of upper-outer quadrant of right female breast (HCC) Diagnosed 2016 treated with lumpectomy, Taxotere /Cytoxan  as well as radiation therapy, triple negative   Breast cancer surveillance: 1.  Breast exam on 12/08/2021: Benign no palpable lumps or nodules 2.  Mammogram 05/19/2023: Benign breast density category C  3.  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   Lab review: 01/19/2022: Hemoglobin 12.9, ferritin 144, iron  saturation 27% 08/03/2022: Hemoglobin 13.2, MCV 88.9, iron  saturation 23%, ferritin 49 (no need of IV iron ) 02/16/2023: Hemoglobin 12, MCV 90.1, iron  saturation 19%, ferritin 20 (no need  of IV iron ) 07/10/2023: Hemoglobin 11.1, MCV 83, iron  saturation 11%, ferritin 10 01/09/2024: Hemoglobin 12.4, MCV 92.5, iron  saturation 30%, ferritin 196   Peripheral neuropathy: Slowly improving.   Pancreatitis: Unable to take creon due to cost (having diarrhea).     Recheck labs in 6 months and telephone  visit follow-up after that      I discussed the assessment and treatment plan with the patient. The patient was provided an opportunity to ask questions and all were answered. The patient agreed with the plan and demonstrated an understanding of the instructions. The patient was advised to call back or seek an in-person evaluation if the symptoms worsen or if the condition fails to improve as anticipated.   I provided 20 minutes of non-face-to-face time during this encounter.  This includes time for charting and coordination of care   Naomi MARLA Chad, MD

## 2024-01-15 ENCOUNTER — Emergency Department (HOSPITAL_COMMUNITY)

## 2024-01-15 ENCOUNTER — Emergency Department (HOSPITAL_COMMUNITY)
Admission: EM | Admit: 2024-01-15 | Discharge: 2024-01-15 | Disposition: A | Discharge status: Home / Self Care | Attending: Emergency Medicine | Admitting: Emergency Medicine

## 2024-01-15 ENCOUNTER — Other Ambulatory Visit: Payer: Self-pay | Admitting: Cardiology

## 2024-01-15 ENCOUNTER — Encounter (HOSPITAL_COMMUNITY): Payer: Self-pay | Admitting: Emergency Medicine

## 2024-01-15 ENCOUNTER — Other Ambulatory Visit: Payer: Self-pay

## 2024-01-15 DIAGNOSIS — I10 Essential (primary) hypertension: Secondary | ICD-10-CM | POA: Insufficient documentation

## 2024-01-15 DIAGNOSIS — R079 Chest pain, unspecified: Secondary | ICD-10-CM | POA: Diagnosis not present

## 2024-01-15 DIAGNOSIS — Z79899 Other long term (current) drug therapy: Secondary | ICD-10-CM | POA: Diagnosis not present

## 2024-01-15 DIAGNOSIS — I2510 Atherosclerotic heart disease of native coronary artery without angina pectoris: Secondary | ICD-10-CM | POA: Diagnosis not present

## 2024-01-15 DIAGNOSIS — R002 Palpitations: Secondary | ICD-10-CM

## 2024-01-15 DIAGNOSIS — Z853 Personal history of malignant neoplasm of breast: Secondary | ICD-10-CM | POA: Diagnosis not present

## 2024-01-15 DIAGNOSIS — Z91040 Latex allergy status: Secondary | ICD-10-CM | POA: Insufficient documentation

## 2024-01-15 DIAGNOSIS — R0789 Other chest pain: Secondary | ICD-10-CM | POA: Diagnosis not present

## 2024-01-15 DIAGNOSIS — I700 Atherosclerosis of aorta: Secondary | ICD-10-CM | POA: Diagnosis not present

## 2024-01-15 DIAGNOSIS — E7800 Pure hypercholesterolemia, unspecified: Secondary | ICD-10-CM

## 2024-01-15 DIAGNOSIS — R0989 Other specified symptoms and signs involving the circulatory and respiratory systems: Secondary | ICD-10-CM | POA: Diagnosis not present

## 2024-01-15 DIAGNOSIS — Z7982 Long term (current) use of aspirin: Secondary | ICD-10-CM | POA: Diagnosis not present

## 2024-01-15 DIAGNOSIS — R42 Dizziness and giddiness: Secondary | ICD-10-CM | POA: Diagnosis not present

## 2024-01-15 DIAGNOSIS — Z981 Arthrodesis status: Secondary | ICD-10-CM | POA: Diagnosis not present

## 2024-01-15 DIAGNOSIS — R0602 Shortness of breath: Secondary | ICD-10-CM | POA: Diagnosis not present

## 2024-01-15 DIAGNOSIS — I200 Unstable angina: Secondary | ICD-10-CM

## 2024-01-15 LAB — CBC WITH DIFFERENTIAL/PLATELET
Abs Immature Granulocytes: 0.02 K/uL (ref 0.00–0.07)
Basophils Absolute: 0 K/uL (ref 0.0–0.1)
Basophils Relative: 0 %
Eosinophils Absolute: 0.3 K/uL (ref 0.0–0.5)
Eosinophils Relative: 4 %
HCT: 41.6 % (ref 36.0–46.0)
Hemoglobin: 13.4 g/dL (ref 12.0–15.0)
Immature Granulocytes: 0 %
Lymphocytes Relative: 19 %
Lymphs Abs: 1.5 K/uL (ref 0.7–4.0)
MCH: 30.8 pg (ref 26.0–34.0)
MCHC: 32.2 g/dL (ref 30.0–36.0)
MCV: 95.6 fL (ref 80.0–100.0)
Monocytes Absolute: 0.7 K/uL (ref 0.1–1.0)
Monocytes Relative: 9 %
Neutro Abs: 5.4 K/uL (ref 1.7–7.7)
Neutrophils Relative %: 68 %
Platelets: 220 K/uL (ref 150–400)
RBC: 4.35 MIL/uL (ref 3.87–5.11)
RDW: 12.9 % (ref 11.5–15.5)
WBC: 7.9 K/uL (ref 4.0–10.5)
nRBC: 0 % (ref 0.0–0.2)

## 2024-01-15 LAB — BASIC METABOLIC PANEL WITH GFR
Anion gap: 13 (ref 5–15)
BUN: 7 mg/dL — ABNORMAL LOW (ref 8–23)
CO2: 31 mmol/L (ref 22–32)
Calcium: 8.9 mg/dL (ref 8.9–10.3)
Chloride: 94 mmol/L — ABNORMAL LOW (ref 98–111)
Creatinine, Ser: 0.8 mg/dL (ref 0.44–1.00)
GFR, Estimated: >60 mL/min (ref >60)
Glucose, Bld: 102 mg/dL — ABNORMAL HIGH (ref 70–99)
Potassium: 3.5 mmol/L (ref 3.5–5.1)
Sodium: 138 mmol/L (ref 135–145)

## 2024-01-15 LAB — URINALYSIS, ROUTINE W REFLEX MICROSCOPIC
Bilirubin Urine: NEGATIVE
Glucose, UA: NEGATIVE mg/dL
Hgb urine dipstick: NEGATIVE
Ketones, ur: NEGATIVE mg/dL
Nitrite: NEGATIVE
Protein, ur: NEGATIVE mg/dL
Specific Gravity, Urine: 1.005 (ref 1.005–1.030)
pH: 6 (ref 5.0–8.0)

## 2024-01-15 LAB — TROPONIN I (HIGH SENSITIVITY)
Troponin I (High Sensitivity): 5 ng/L (ref <18)
Troponin I (High Sensitivity): 6 ng/L (ref <18)

## 2024-01-15 LAB — HEPATIC FUNCTION PANEL
ALT: 13 U/L (ref 0–44)
AST: 20 U/L (ref 15–41)
Albumin: 3.6 g/dL (ref 3.5–5.0)
Alkaline Phosphatase: 62 U/L (ref 38–126)
Bilirubin, Direct: 0.1 mg/dL (ref 0.0–0.2)
Indirect Bilirubin: 0.6 mg/dL (ref 0.3–0.9)
Total Bilirubin: 0.7 mg/dL (ref 0.0–1.2)
Total Protein: 7.8 g/dL (ref 6.5–8.1)

## 2024-01-15 LAB — CBG MONITORING, ED: Glucose-Capillary: 86 mg/dL (ref 70–99)

## 2024-01-15 LAB — MAGNESIUM: Magnesium: 1.8 mg/dL (ref 1.7–2.4)

## 2024-01-15 LAB — LIPASE, BLOOD: Lipase: 40 U/L (ref 11–51)

## 2024-01-15 MED ORDER — NITROGLYCERIN 2 % TD OINT
1.0000 [in_us] | TOPICAL_OINTMENT | Freq: Once | TRANSDERMAL | Status: AC
  Administered 2024-01-15: 1 [in_us] via TOPICAL
  Filled 2024-01-15: qty 1

## 2024-01-15 MED ORDER — IOHEXOL 350 MG/ML SOLN
75.0000 mL | Freq: Once | INTRAVENOUS | Status: AC | PRN
  Administered 2024-01-15: 75 mL via INTRAVENOUS

## 2024-01-15 MED ORDER — ISOSORBIDE MONONITRATE ER 30 MG PO TB24: 30.0000 mg | ORAL_TABLET | Freq: Every day | ORAL | 0 refills | Status: DC

## 2024-01-15 NOTE — Progress Notes (Signed)
 14d zio ordered for palpitations Cardiac Stress PET for chest pain

## 2024-01-15 NOTE — ED Notes (Addendum)
 Patient not transported to CT at this time

## 2024-01-15 NOTE — ED Triage Notes (Signed)
 Pt BIB [facility] from home due to chest pain since 1040 today 01/15/2024.  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  Pt took 324mg  Aspirin .  [facility] gave patient one nitroglycerin  0.4.  20g left AC. VS BP 140/90

## 2024-01-15 NOTE — Discharge Instructions (Addendum)
 Please start the Imdur  15 mg daily.  Cardiology should send you a heart monitor to wear and will call you about follow-up.  Please follow-up as discussed and return to the emergency department for worsening symptoms.

## 2024-01-15 NOTE — ED Provider Notes (Signed)
 I received the patient in signout.  Plan is for reevaluation after CT angiogram has returned and to discuss with cardiology who is seeing the patient.  The patient did have 2 troponins that were negative.  I spoke with Dr. Barnetta.  He recommends starting the patient on 15 mg of Imdur  daily and they will get the patient in for a stress test as an outpatient.  Also recommends remote telemetry monitoring which will be ordered by cardiology.  They will get the patient in for follow-up.  Recommends discharge with outpatient follow-up.  The patient is discharged with return precautions.  Physical Exam  BP (!) 143/77   Pulse 66   Temp 98 F (36.7 C) (Oral)   Resp 18   Ht 4' 8 (1.422 m)   Wt 66.7 kg   SpO2 99%   BMI 32.96 kg/m   Physical Exam General: No acute distress  Procedures  Procedures  ED Course / MDM    Medical Decision Making Amount and/or Complexity of Data Reviewed Labs: ordered. Radiology: ordered.  Risk Prescription drug management.          Ula Prentice SAUNDERS, MD 01/15/24 [phone]

## 2024-01-15 NOTE — ED Notes (Signed)
 CCMD called by this RN

## 2024-01-15 NOTE — ED Provider Notes (Signed)
 [place] EMERGENCY DEPARTMENT AT [facility] Provider Note   CSN: [id number] Arrival date & time: 01/15/24  1139     Patient presents with: Chest Pain   Angela Bond is a 78 y.o. female.    Chest Pain Associated symptoms: dizziness   Patient presents for chest pain.  Medical history includes atrial fibrillation, GERD, HTN, depression, HLD, CAD, arthritis, remote breast cancer.  She was last seen by cardiology a year ago.  She has undergone heart cath in 2013 and 2019.  In 2019, heart cath showed patent LAD stents with mild multivessel disease.  Today, onset of dizziness and chest pain was 10:40 AM.  At the time she was at rest.  She took 324 of ASA and 1 SL NTG prior to arrival.  She does feel that the NTG improved her symptoms.  She denies any ongoing dizziness.  Currently, she has a mild chest tightness.  She denies any other associated symptoms.     Prior to Admission medications   Medication Sig Start Date End Date Taking? Authorizing Provider  ALPRAZolam  (XANAX ) 0.25 MG tablet Take 0.25 mg by mouth 2 (two) times daily as needed. 10/15/19   [provider]  aspirin  EC 81 MG EC tablet Take 1 tablet (81 mg total) by mouth daily. 11/06/15   Marcine Catalan M, PA-C  calcium  carbonate (OSCAL) 1500 (600 Ca) MG TABS tablet Take 1 tablet by mouth daily.    [provider]  Cholecalciferol  (VITAMIN D ) 2000 UNITS tablet Take 2,000 Units by mouth daily.    [provider]  CREON 36000-114000 units CPEP capsule 2 capsules with each meal and 1 capsule with each snack Orally with food for 90 days 10/14/22   [provider]  escitalopram  (LEXAPRO ) 20 MG tablet Take 1 tablet (20 mg total) by mouth daily. 12/08/21   Gudena, Vinay, MD  famotidine  (PEPCID ) 20 MG tablet Take 20 mg by mouth at bedtime.  08/31/18   [provider]  fexofenadine (ALLEGRA) 180 MG tablet Take 180 mg by mouth daily as needed for allergies.     [provider]   fluticasone  (FLONASE ) 50 MCG/ACT nasal spray Place 2 sprays into both nostrils daily. 04/05/23   Rising, Asberry, PA-C  Magnesium  500 MG TABS Take 500 mg by mouth at bedtime.    [provider]  nitroGLYCERIN  (NITROSTAT ) 0.4 MG SL tablet Place 1 tablet (0.4 mg total) under the tongue every 5 (five) minutes as needed for chest pain (CP or SOB). 09/07/18   Jeffrie Oneil BROCKS, MD  pantoprazole  (PROTONIX ) 40 MG tablet 1 tablet daily. 11/07/22   [provider]  Polyvinyl Alcohol -Povidone PF (REFRESH) 1.4-0.6 % SOLN Place 2 drops into both eyes 3 (three) times daily as needed (for dry eyes).    [provider]  tiZANidine  (ZANAFLEX ) 4 MG tablet Take 1 tablet (4 mg total) by mouth every 8 (eight) hours as needed for muscle spasms. 11/30/23   Rolinda Rogue, MD  traMADol  (ULTRAM ) 50 MG tablet Take 1 tablet (50 mg total) by mouth every 6 (six) hours as needed. 12/03/23   Armenta Canning, MD  vitamin C  (ASCORBIC ACID ) 500 MG tablet Take 500 mg by mouth daily.    [provider]    Allergies: Other, Oyster shell, Shellfish allergy, Aspirin , Penicillins, Fosamax  [alendronate ], Brilinta  [ticagrelor ], Ciprofloxacin, Colesevelam, Doxycycline, Evista [raloxifene], Fosamax  [alendronate  sodium], Lactose intolerance (gi), Latex, Naproxen, Naproxen sodium, Nitrofurantoin , Pravachol  [pravastatin ], Sulfa antibiotics, Tape, Welchol [colesevelam hcl], and Zocor [simvastatin]  Review of Systems  Cardiovascular:  Positive for chest pain.  Neurological:  Positive for dizziness.  All other systems reviewed and are negative.   Updated Vital Signs BP (!) 143/77   Pulse 66   Temp 98.3 F (36.8 C) (Oral)   Resp 18   Ht 4' 8 (1.422 m)   Wt 66.7 kg   SpO2 99%   BMI 32.96 kg/m   Physical Exam Vitals and nursing note reviewed.  Constitutional:      General: She is not in acute distress.    Appearance: She is well-developed. She is not ill-appearing, toxic-appearing or diaphoretic.   HENT:     Head: Normocephalic and atraumatic.  Eyes:     Conjunctiva/sclera: Conjunctivae normal.  Cardiovascular:     Rate and Rhythm: Normal rate and regular rhythm.     Heart sounds: No murmur heard. Pulmonary:     Effort: Pulmonary effort is normal. No tachypnea or respiratory distress.     Breath sounds: Normal breath sounds.  Chest:     Chest wall: No tenderness.  Abdominal:     Palpations: Abdomen is soft.     Tenderness: There is no abdominal tenderness.  Musculoskeletal:        General: No swelling. Normal range of motion.     Cervical back: Normal range of motion and neck supple.  Skin:    General: Skin is warm and dry.     Coloration: Skin is not cyanotic or pale.  Neurological:     General: No focal deficit present.     Mental Status: She is alert and oriented to person, place, and time.  Psychiatric:        Mood and Affect: Mood normal.        Behavior: Behavior normal.     (all labs ordered are listed, but only abnormal results are displayed) Labs Reviewed  BASIC METABOLIC PANEL WITH GFR - Abnormal; Notable for the following components:      Result Value   Chloride 94 (*)    Glucose, Bld 102 (*)    BUN 7 (*)    All other components within normal limits  MAGNESIUM   LIPASE, BLOOD  HEPATIC FUNCTION PANEL  CBC WITH DIFFERENTIAL/PLATELET  URINALYSIS, ROUTINE W REFLEX MICROSCOPIC  CBG MONITORING, ED  TROPONIN I (HIGH SENSITIVITY)  TROPONIN I (HIGH SENSITIVITY)    EKG: None  Radiology: DG Chest Portable 1 View Result Date: 01/15/2024 EXAM: 1 VIEW XRAY OF THE CHEST 01/15/2024 01:16:30 PM COMPARISON: None available. CLINICAL HISTORY: Chest pain; hx of CAD FINDINGS: LUNGS AND PLEURA: Low lung volumes. Chronic right apical scarring. No focal pulmonary opacity. No pulmonary edema. No pleural effusion. No pneumothorax. HEART AND MEDIASTINUM: Calcified aortic atherosclerosis. No acute abnormality of the cardiac and mediastinal silhouettes. BONES AND SOFT TISSUES:  Right chest wall surgical clips. Partially visible lumbar fusion hardware. No acute osseous abnormality. IMPRESSION: 1. No acute findings. Electronically signed by: Lonni Necessary MD 01/15/2024 02:05 PM EDT RP Workstation: [id number]     Procedures   Medications Ordered in the ED  nitroGLYCERIN  (NITROGLYN) 2 % ointment 1 inch (1 inch Topical Given 01/15/24 1343)                                    Medical Decision Making Amount and/or Complexity of Data Reviewed Labs: ordered. Radiology: ordered.  Risk Prescription drug management.   This patient presents to the ED for  concern of chest pain, this involves an extensive number of treatment options, and is a complaint that carries with it a high risk of complications and morbidity.  The differential diagnosis includes ACS, pericarditis, PE, GERD, musculoskeletal etiology, anxiety   Co morbidities / Chronic conditions that complicate the patient evaluation  atrial fibrillation, GERD, HTN, depression, HLD, CAD, arthritis, remote breast cancer   Additional history obtained:  Additional history obtained from EMR External records from outside source obtained and reviewed including N/A   Lab Tests:  I Ordered, and personally interpreted labs.  The pertinent results include: Normal hemoglobin, no leukocytosis, normal kidney function, normal electrolytes, normal initial troponin   Imaging Studies ordered:  I ordered imaging studies including x-ray I independently visualized and interpreted imaging which showed no acute findings I agree with the radiologist interpretation   Cardiac Monitoring: / EKG:  The patient was maintained on a cardiac monitor.  I personally viewed and interpreted the cardiac monitored which showed an underlying rhythm of: Sinus rhythm   Problem List / ED Course / Critical interventions / Medication management  Patient presenting for onset of dizziness and chest pain at 10:40 AM.  At the time, she  was at rest.  She does have a history of CAD.  Her dizziness has resolved.  She her chest discomfort improved with NTG.  She did take 324 of ASA prior to arrival.  On arrival, vital signs are normal.  Patient is well-appearing on exam.  She endorses ongoing mild chest tightness.  EKG does not show any evidence of STEMI.  Per chart review, last heart cath was in 2019.  At the time, she did have multivessel disease.  Patient was kept on monitor and workup was initiated.  NTG ointment was ordered.  Patient's initial lab work and chest x-ray were unremarkable.  On reassessment, patient's chest discomfort has resolved.  Given her cardiac history, I spoke with cardiologist on-call, Dr. Kate, who will see the patient in consult.  Care of patient signed out to oncoming ED provider. I ordered medication including NTG for chest pain Reevaluation of the patient after these medicines showed that the patient improved I have reviewed the patients home medicines and have made adjustments as needed   Consultations Obtained:  I requested consultation with the urologist, Dr. Kate,  and discussed lab and imaging findings as well as pertinent plan - they recommend: Will see in consult   Social Determinants of Health:  Lives independently     Final diagnoses:  Chest pain, unspecified type    ED Discharge Orders     None          Melvenia Motto, MD 01/15/24 1451

## 2024-01-15 NOTE — ED Notes (Signed)
Phlebotomy to collect second troponin

## 2024-01-15 NOTE — Consult Note (Signed)
 Cardiology Consultation   Patient ID: Angela Bond MRN: [id number]; DOB: 02-27-1946  Admit date: 01/15/2024 Date of Consult: 01/15/2024  PCP:  Auston Opal, DO   [facility] Providers Cardiologist:  Oneil Parchment, MD      Patient Profile: Angela Bond is a 78 y.o. female with a hx of CAD status post NSTEMI in 2017, hypertension, hyperlipidemia, breast cancer who is being seen 01/15/2024 for the evaluation of chest pain at the request of Dr. Melvenia.  History of Present Illness: Angela Bond is a 78 year old female with the above medical history.  She presented with NSTEMI in 2017.  Cath showed 30% proximal to mid RCA stenosis, 99% mid LAD stenosis, 35% D2 stenosis.  Underwent successful PCI to mid LAD lesion with DES.  Underwent repeat LHC in 2019 which showed patent LAD stent but ostial D1 lesion with 75% stenosis, jailed by stent but small vessel.  Echocardiogram in 2019 showed EF 55 to 60%.  She follows with Dr. Parchment, last seen 02/2023.  She presented to the ED today with chest pain.  Reports that she has been having episodes of lightheadedness and palpitations that occur without warning.  Occur about once per week.  States that had episode this morning.  Subsequently he began having pressure in her chest.  Reports chest pressure lasted for several hours, did not resolve till she was in the ED and received nitroglycerin .  Initial vitals in ED notable for BP 142/94, pulse 88, SpO2 100% on room air.  Labs notable for creatinine 0.80, normal electrolytes, hemoglobin 13.4, WBC 7.9, initial troponin 5.  Chest x-ray unremarkable.  EKG with sinus rhythm, rate 80, LVH with repolarization abnormalities, poor R wave progression.   Past Medical History:  Diagnosis Date   Anemia    hx   Anxiety and depression    Aortic atherosclerosis (HCC) 07/31/2017   Arthritis    ~ all over my body; in the joints   Breast cancer, right (HCC) 3/16   Coronary artery disease    a.  s/p NSTEMI in June  2017 with DES to mid LAD.   Depression    Gallstones    GERD (gastroesophageal reflux disease)    H/O hiatal hernia    Headache    Heart murmur    History of blood transfusion    when taking chemo & w/one of my knee replacements (11/05/2015)   Hypercholesteremia    Hypertension    Iron  deficiency anemia 02/16/2017   Multiple bruises    Myocardial infarction (HCC)    Orthostatic hypotension    Personal history of chemotherapy    Personal history of radiation therapy    Pneumonia X 1   Shingles 5/15   hx   Sliding hiatal hernia s/p lap PEH repair 07/31/2012   Unstable angina (HCC) 01/23/2012   Unsteady gait     I fall easily   Wears dentures    partial   Wears glasses     Past Surgical History:  Procedure Laterality Date   BREAST BIOPSY Right    BREAST LUMPECTOMY Right 08/2014   BREAST LUMPECTOMY WITH SENTINEL LYMPH NODE BIOPSY Right 09/22/2014   Procedure: RIGHT BREAST LUMPECTOMY WITH NEEDLE LOCALIZATION AND RIGHT AXILLARY SENTINEL LYMPH NODE BIOPSY;  Surgeon: Deward Null III, MD;  Location: [facility] OR;  Service: General;  Laterality: Right;   CARDIAC CATHETERIZATION     CARDIAC CATHETERIZATION N/A 11/05/2015   Procedure: Left Heart Cath and Coronary Angiography;  Surgeon: Victory LELON Sharps, MD;  Location: [facility] INVASIVE CV LAB;  Service: Cardiovascular;  Laterality: N/A;   CARDIAC CATHETERIZATION N/A 11/05/2015   Procedure: Coronary Stent Intervention;  Surgeon: Victory LELON Sharps, MD;  Location: [facility] INVASIVE CV LAB;  Service: Cardiovascular;  Laterality: N/A;   CARPAL TUNNEL RELEASE Right 09/2015   CHOLECYSTECTOMY N/A 03/28/2018   Procedure: LAPAROSCOPIC CHOLECYSTECTOMY WITH INTRAOPERATIVE CHOLANGIOGRAM ERAS PATHWAY;  Surgeon: Curvin Deward MOULD, MD;  Location: [facility] OR;  Service: General;  Laterality: N/A;   CORONARY ANGIOPLASTY WITH STENT PLACEMENT  11/05/2015   1 stent   ESOPHAGEAL MANOMETRY N/A 07/02/2012   Procedure: ESOPHAGEAL MANOMETRY (EM);  Surgeon: Jerrell KYM Sol, MD;  Location: [facility]  ENDOSCOPY;  Service: Endoscopy;  Laterality: N/A;  schooler to read   EYE SURGERY Left 2013   tube placed tear duct   HIATAL HERNIA REPAIR N/A 08/10/2012   Procedure: LAPAROSCOPIC REPAIR OF HIATAL HERNIA WITH MESH AND EGD WITH PEG TUBE PLACEMENT;  Surgeon: Lynda Leos, MD;  Location: [facility] ORS;  Service: General;  Laterality: N/A;   LEFT HEART CATH AND CORONARY ANGIOGRAPHY N/A 08/01/2017   Procedure: LEFT HEART CATH AND CORONARY ANGIOGRAPHY;  Surgeon: Dann Candyce RAMAN, MD;  Location: [facility] INVASIVE CV LAB;  Service: Cardiovascular;  Laterality: N/A;   ORIF ELBOW FRACTURE Right 12/25/2018   ORIF HUMERUS FRACTURE Right 12/25/2018   Procedure: OPEN REDUCTION INTERNAL FIXATION (ORIF) DISTAL HUMERUS FRACTURE;  Surgeon: Celena Sharper, MD;  Location: [facility] OR;  Service: Orthopedics;  Laterality: Right;   PORT-A-CATH REMOVAL  06/2015   PORTACATH PLACEMENT Left 09/22/2014   Procedure: INSERTION PORT-A-CATH;  Surgeon: Deward Curvin III, MD;  Location: [facility] OR;  Service: General;  Laterality: Left;   POSTERIOR LUMBAR FUSION  2016   had 2 vertebrae fused   TONSILLECTOMY  as child   TOTAL KNEE ARTHROPLASTY Bilateral 2011,2012       Scheduled Meds:  Continuous Infusions:  PRN Meds:   Allergies:    Allergies  Allergen Reactions   Other Anaphylaxis, Nausea And Vomiting and Other (See Comments)    THROAT CLOSE, CAN'T BREATH ANAPHYLAXIS TO OYSTERS   Oyster Shell Anaphylaxis   Shellfish Allergy Anaphylaxis and Other (See Comments)    ANAPHYLAXIS TO OYSTERS   Aspirin  Other (See Comments)    Burns stomach   Penicillins Nausea And Vomiting and Other (See Comments)    PATIENT HAS HAD A PCN REACTION WITH IMMEDIATE RASH, FACIAL/TONGUE/THROAT SWELLING, SOB, OR LIGHTHEADEDNESS WITH HYPOTENSION:  #  #  YES  #  #  Has patient had a PCN reaction causing severe rash involving mucus membranes or skin necrosis: No Has patient had a PCN reaction that required hospitalization: No Has patient had a PCN reaction  occurring within the last 10 years: No If all of the above answers are NO, then may proceed with Cephalosporin use.    Fosamax  [Alendronate ] Other (See Comments)   Brilinta  [Ticagrelor ] Other (See Comments)    Indigestion   Ciprofloxacin Nausea And Vomiting and Other (See Comments)   Colesevelam Other (See Comments)    Stomach cramps   Doxycycline Nausea And Vomiting   Evista [Raloxifene] Other (See Comments)    LEG ACHES   Fosamax  [Alendronate  Sodium] Other (See Comments)    Leg aches   Lactose Intolerance (Gi) Nausea And Vomiting   Latex Rash   Naproxen Nausea And Vomiting and Other (See Comments)   Naproxen Sodium Nausea And Vomiting   Nitrofurantoin  Rash and Other (See Comments)    I broke out down there   Pravachol  [  Pravastatin ] Other (See Comments)    GI upset    Sulfa Antibiotics Nausea And Vomiting   Tape Rash   Welchol [Colesevelam Hcl] Other (See Comments)    Stomach cramps   Zocor [Simvastatin] Rash    Social History:   Social History   Socioeconomic History   Marital status: Married    Spouse name: Not on file   Number of children: 1   Years of education: Not on file   Highest education level: Not on file  Occupational History   Not on file  Tobacco Use   Smoking status: Never   Smokeless tobacco: Never  Vaping Use   Vaping status: Never Used  Substance and Sexual Activity   Alcohol  use: No   Drug use: No   Sexual activity: Not Currently    Comment: 1st intercourse 65 yo-2 partners  Other Topics Concern   Not on file  Social History Narrative   Lives with husband.  Does not use assist device.     Social Drivers of Health   Financial Resource Strain: Medium Risk (12/15/2021)   Overall Financial Resource Strain (CARDIA)    Difficulty of Paying Living Expenses: Somewhat hard  Food Insecurity: Food Insecurity Present (12/30/2021)   Hunger Vital Sign    Worried About Running Out of Food in the Last Year: Sometimes true    Ran Out of Food in the  Last Year: Sometimes true  Transportation Needs: No Transportation Needs (12/15/2021)   PRAPARE - Administrator, Civil Service (Medical): No    Lack of Transportation (Non-Medical): No  Physical Activity: Not on file  Stress: Stress Concern Present (12/15/2021)   [employer] of Occupational Health - Occupational Stress Questionnaire    Feeling of Stress : To some extent  Social Connections: Moderately Integrated (12/15/2021)   Social Connection and Isolation Panel    Frequency of Communication with Friends and Family: More than three times a week    Frequency of Social Gatherings with Friends and Family: More than three times a week    Attends Religious Services: More than 4 times per year    Active Member of [employer] or Organizations: Yes    Attends Banker Meetings: More than 4 times per year    Marital Status: Widowed  Catering manager Violence: Not on file    Family History:    Family History  Problem Relation Age of Onset   Heart disease Mother        80s, CHF   Heart disease Father        d/o MI at 48   Cancer Father        skin   Diabetes Father    Cancer Sister 21       breast   Diabetes Sister    Heart disease Sister        CABG in mid-60s     ROS:  Please see the history of present illness.   All other ROS reviewed and negative.     Physical Exam/Data: Vitals:   01/15/24 1143 01/15/24 1145 01/15/24 1430  BP:  (!) 142/94 (!) 143/77  Pulse:  88 66  Resp:  14 18  Temp:  98.3 F (36.8 C)   TempSrc:  Oral   SpO2:  100% 99%  Weight: 66.7 kg    Height: 4' 8 (1.422 m)     No intake or output data in the 24 hours ending 01/15/24 1450    01/15/2024  11:43 AM 11/30/2023   11:07 AM 08/01/2023    2:19 PM  Last 3 Weights  Weight (lbs) 147 lb 150 lb 151 lb 6.4 oz  Weight (kg) 66.679 kg 68.04 kg 68.675 kg     Body mass index is 32.96 kg/m.  General:  in no acute distress HEENT: normal Neck: no JVD Cardiac:  normal S1, S2; RRR; no  murmur  Lungs:  clear to auscultation bilaterally, no wheezing, rhonchi or rales  Abd: soft, nontender, no hepatomegaly  Ext: no edema Musculoskeletal:  No deformities Skin: warm and dry  Neuro:  no focal abnormalities noted Psych:  Normal affect   EKG:  The EKG was personally reviewed and demonstrates:  EKG with sinus rhythm, rate 80, LVH with repolarization abnormalities, poor R wave progression. Telemetry:  Telemetry was personally reviewed and demonstrates:  NSR  Relevant CV Studies:   Laboratory Data: High Sensitivity Troponin:   Recent Labs  Lab 01/15/24 1257  TROPONINIHS 5     Chemistry Recent Labs  Lab 01/15/24 1257  NA 138  K 3.5  CL 94*  CO2 31  GLUCOSE 102*  BUN 7*  CREATININE 0.80  CALCIUM  8.9  MG 1.8  GFRNONAA >60  ANIONGAP 13    Recent Labs  Lab 01/15/24 1257  PROT 7.8  ALBUMIN 3.6  AST 20  ALT 13  ALKPHOS 62  BILITOT 0.7   Lipids No results for input(s): CHOL, TRIG, HDL, LABVLDL, LDLCALC, CHOLHDL in the last 168 hours.  Hematology Recent Labs  Lab 01/09/24 1328 01/15/24 1257  WBC 7.2 7.9  RBC 4.01 4.35  HGB 12.4 13.4  HCT 37.1 41.6  MCV 92.5 95.6  MCH 30.9 30.8  MCHC 33.4 32.2  RDW 12.9 12.9  PLT 207 220   Thyroid  No results for input(s): TSH, FREET4 in the last 168 hours.  BNPNo results for input(s): BNP, PROBNP in the last 168 hours.  DDimer No results for input(s): DDIMER in the last 168 hours.  Radiology/Studies:  DG Chest Portable 1 View Result Date: 01/15/2024 EXAM: 1 VIEW XRAY OF THE CHEST 01/15/2024 01:16:30 PM COMPARISON: None available. CLINICAL HISTORY: Chest pain; hx of CAD FINDINGS: LUNGS AND PLEURA: Low lung volumes. Chronic right apical scarring. No focal pulmonary opacity. No pulmonary edema. No pleural effusion. No pneumothorax. HEART AND MEDIASTINUM: Calcified aortic atherosclerosis. No acute abnormality of the cardiac and mediastinal silhouettes. BONES AND SOFT TISSUES: Right chest wall  surgical clips. Partially visible lumbar fusion hardware. No acute osseous abnormality. IMPRESSION: 1. No acute findings. Electronically signed by: Lonni Necessary MD 01/15/2024 02:05 PM EDT RP Workstation: [id number]     Assessment and Plan:  Chest pain: Reported episode of chest pressure lasting for hours this morning, occurred after an episode of palpitation/lightheadedness.  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   - Start imdur  15 mg daily - Troponins negative x2.  CTPA is pending, if unremarkable can plan outpatient workup with stress PET.    Palpitations/lightheadedness: Description concerning for arrhythmia, would plan Zio patch x 2 weeks as outpatient for further evaluation.  CAD: She presented with NSTEMI in 2017.  Cath showed 30% proximal to mid RCA stenosis, 99% mid LAD stenosis, 35% D2 stenosis.  Underwent successful PCI to mid LAD lesion with DES.  Underwent repeat LHC in 2019 which showed patent LAD stent but ostial D1 lesion with 75% stenosis, jailed by stent but small  vessel. - Continue aspirin  - Has had intolerance to statins, will refer to pharmacy lipid clinic  - Recommend starting Imdur  15 mg daily given could be having chest pain from jailed diagonal branch - Plan stress PET as outpatient if negative troponins as above  Informed Consent   Shared Decision Making/Informed Consent The risks [chest pain, shortness of breath, cardiac arrhythmias, dizziness, blood pressure fluctuations, myocardial infarction, stroke/transient ischemic attack, nausea, vomiting, allergic reaction, radiation exposure, metallic taste sensation and life-threatening complications (estimated to be 1 in 10,000)], benefits (risk stratification, diagnosing coronary artery disease, treatment guidance) and alternatives of a cardiac PET stress test were discussed in detail  with Angela Bond and she agrees to proceed.       For questions or updates, please contact [facility] Please consult [email or web address] for contact info under    Signed, Lonni LITTIE Nanas, MD  01/15/2024 2:50 PM

## 2024-01-16 ENCOUNTER — Ambulatory Visit: Attending: Cardiology

## 2024-01-16 ENCOUNTER — Telehealth: Payer: Self-pay | Admitting: Cardiology

## 2024-01-16 ENCOUNTER — Encounter: Payer: Self-pay | Admitting: *Deleted

## 2024-01-16 DIAGNOSIS — R002 Palpitations: Secondary | ICD-10-CM

## 2024-01-16 NOTE — Telephone Encounter (Signed)
 Pt c/o medication issue:  1. Name of Medication:   isosorbide  mononitrate (IMDUR ) 30 MG 24 hr tablet    2. How are you currently taking this medication (dosage and times per day)? Not sure   3. Are you having a reaction (difficulty breathing--STAT)? No   4. What is your medication issue? .Pt called in stating pharmacy was unsure how to fill this medication because two sets of instructions were sent. Please advise if this can be sent over again with correct instructions. [facility]    Ula Prentice SAUNDERS, MD Last attending  Treatment team Chest pain, unspecified type Clinical impression Chest Pain Chief complaint   ED Provider Notes Ula Prentice SAUNDERS, MD (Physician)  Emergency Medicine I received the patient in signout.  Plan is for reevaluation after CT angiogram has returned and to discuss with cardiology who is seeing the patient.  The patient did have 2 troponins that were negative.  I spoke with Dr. Barnetta.  He recommends starting the patient on 15 mg of Imdur  daily and they will get the patient in for a stress test as an outpatient.  Also recommends remote telemetry monitoring which will be ordered by cardiology.  They will get the patient in for follow-up.  Recommends discharge with outpatient follow-up.  The patient is discharged with return precautions.      [id number] - [place], [place] - [address]

## 2024-01-16 NOTE — Progress Notes (Unsigned)
 Enrolled for [employer] to mail a ZIO XT long term holter monitor to the patients address on file.   Dr. Jeffrie to read.  Letter with instructions mailed to patient.

## 2024-01-17 MED ORDER — ISOSORBIDE MONONITRATE ER 30 MG PO TB24: 15.0000 mg | ORAL_TABLET | Freq: Every day | ORAL | 0 refills | Status: DC

## 2024-01-17 NOTE — Telephone Encounter (Signed)
 Rx for imdur  30mg  tabs - take 1/2 tab PO daily Rx sent to pharmacy electronically.  Cardiac PET stress test ordered - pending.  Zio monitor ordered - pending.

## 2024-02-09 DIAGNOSIS — R002 Palpitations: Secondary | ICD-10-CM

## 2024-02-11 NOTE — Progress Notes (Deleted)
 Cardiology Office Note:    Date:  02/11/2024   ID:  Angela Bond, DOB 1945-09-09, MRN [id number]  PCP:  Auston Opal, DO   [facility] Providers Cardiologist:  Oneil Parchment, MD { Click to update primary MD,subspecialty MD or APP then REFRESH:1}    Referring MD: Auston Opal, DO   Chief complaint: Hospital follow-up  History of Present Illness:    Angela Bond is a 78 y.o. female with a hx of coronary artery disease, non-ST elevation myocardial infarction in 2017, hypertension, hyperlipidemia, and prior right breast cancer, presented for a hospital follow-up visit.   LHC 01/23/2012: Nonflow-limiting lesion mid LAD with 30% stenosis at bifurcation of D1.  Normal LVSF, LVEDP 20 mmHg, LVEF 60%.  LHC w/ PCI/DES 11/05/2015: ACS secondary to acute/subacute thrombotic obstruction of pLAD.  Successful PTCA and stent of LAD thrombus with reduction of 95% stenosis to 0% with TIMI-3 flow.  Luminal irregularities observed in LCx and RCA.  Severe anteroapical hypokinesis, estimated EF 35-40%, LVEDP normal.   LHC 08/01/2017: Proximal LAD stent widely patent, jailed D1 ostial lesion 75% stenosed (small vessel).  Mid RCA 25% stenosed.  LVSF normal, LVEDP mildly elevated, LVEF 55-65% by visual estimate.  Echo 08/01/2017: LVEF 55-60%, normal LVSF, no RWMA, diastolic parameters normal.  Normal RV.  Trivial MV regurg.  Trivial tricuspid regurg.  Carotid duplex 12/13/2018: Bilateral ICA velocities consistent with 1-39% stenosis.  Left vertebral artery demonstrating antegrade flow, right vertebral artery not visualized.  Last seen for outpatient OV on 02/28/2023.  Denied any chest pain or orthostatic symptoms at that time.  Did describe DOE secondary to deconditioning, had recently suffered a fall and been diagnosed with pancreatitis around that time, stable from a cardiovascular standpoint.  Patient presented to the ED on 01/15/2024 complaining of chest pain, lightheadedness, palpitations occurring  without warning.  The morning of admission patient had also began to have chest pressure lasting several hours not relieved until ED arrival s/p nitroglycerin  administration.  Initial vitals in ED notable for BP 142/94, pulse 88, SpO2 100% on room air.  Labs notable for creatinine 0.80, normal electrolytes, hemoglobin 13.4, WBC 7.9, negative troponins.  Chest x-ray unremarkable.  EKG with sinus rhythm, rate 80, LVH with repolarization abnormalities, poor R wave progression.  CT chest negative for PE.  Symptoms believed to be ischemia secondary to known jailed D1 branch on prior LHC studies. Imdur  started, with plan for NM stress test and ZIO monitoring to be performed outpatient.    ZIO monitoring X 14 days showed HR 48-226 bpm, average HR 77 bpm predominant underlying rhythm sinus rhythm.  Intermittent BBB present.  1 run of ventricular tachycardia lasting 5 beats with a max rate of 188 bpm.  92 SVT runs occurred, fastest interval lasting 11 beats with a max rate of 226 bpm, longest lasting 9.7 secs with average rate of 144 bpm.  NM PET stress test ordered for 02/27/2024.  Chest pain? *** CAD s/p PCI EKG Symptoms Metoprolol  was stopped in past due to dizziness Start Coreg 3.125 mg twice daily *** Continue aspirin  EC 81 mg daily Continue Imdur  15 mg daily Continue nitroglycerin  0.4 mg SL as needed chest pain every 5 minutes Known statin intolerance NM PET stress test ordered for 02/27/2024  Atrial tachycardia 92 runs of SVT occurred over 2-week ZIO monitoring Start Coreg 3.125 mg twice daily ***  Hyperlipidemia LDL goal <55 Most recent lipid panel 03/2019 Order fasting lipids today  Follow-up in 2 months to discuss PET  results and reevaluate symptoms of dizziness/lightheadedness secondary to palpitations following start of beta-blocker therapy  Past Medical History:  Diagnosis Date   Anemia    hx   Anxiety and depression    Aortic atherosclerosis 07/31/2017   Arthritis    ~ all  over my body; in the joints   Breast cancer, right (HCC) 3/16   Coronary artery disease    a.  s/p NSTEMI in June 2017 with DES to mid LAD.   Depression    Gallstones    GERD (gastroesophageal reflux disease)    H/O hiatal hernia    Headache    Heart murmur    History of blood transfusion    when taking chemo & w/one of my knee replacements (11/05/2015)   Hypercholesteremia    Hypertension    Iron  deficiency anemia 02/16/2017   Multiple bruises    Myocardial infarction (HCC)    Orthostatic hypotension    Personal history of chemotherapy    Personal history of radiation therapy    Pneumonia X 1   Shingles 5/15   hx   Sliding hiatal hernia s/p lap PEH repair 07/31/2012   Unstable angina (HCC) 01/23/2012   Unsteady gait     I fall easily   Wears dentures    partial   Wears glasses     Past Surgical History:  Procedure Laterality Date   BREAST BIOPSY Right    BREAST LUMPECTOMY Right 08/2014   BREAST LUMPECTOMY WITH SENTINEL LYMPH NODE BIOPSY Right 09/22/2014   Procedure: RIGHT BREAST LUMPECTOMY WITH NEEDLE LOCALIZATION AND RIGHT AXILLARY SENTINEL LYMPH NODE BIOPSY;  Surgeon: Deward Null III, MD;  Location: [facility] OR;  Service: General;  Laterality: Right;   CARDIAC CATHETERIZATION     CARDIAC CATHETERIZATION N/A 11/05/2015   Procedure: Left Heart Cath and Coronary Angiography;  Surgeon: Victory LELON Sharps, MD;  Location: [facility] INVASIVE CV LAB;  Service: Cardiovascular;  Laterality: N/A;   CARDIAC CATHETERIZATION N/A 11/05/2015   Procedure: Coronary Stent Intervention;  Surgeon: Victory LELON Sharps, MD;  Location: [facility] INVASIVE CV LAB;  Service: Cardiovascular;  Laterality: N/A;   CARPAL TUNNEL RELEASE Right 09/2015   CHOLECYSTECTOMY N/A 03/28/2018   Procedure: LAPAROSCOPIC CHOLECYSTECTOMY WITH INTRAOPERATIVE CHOLANGIOGRAM ERAS PATHWAY;  Surgeon: Null Deward MOULD, MD;  Location: [facility] OR;  Service: General;  Laterality: N/A;   CORONARY ANGIOPLASTY WITH STENT PLACEMENT  11/05/2015   1 stent   ESOPHAGEAL  MANOMETRY N/A 07/02/2012   Procedure: ESOPHAGEAL MANOMETRY (EM);  Surgeon: Jerrell KYM Sol, MD;  Location: [facility] ENDOSCOPY;  Service: Endoscopy;  Laterality: N/A;  schooler to read   EYE SURGERY Left 2013   tube placed tear duct   HIATAL HERNIA REPAIR N/A 08/10/2012   Procedure: LAPAROSCOPIC REPAIR OF HIATAL HERNIA WITH MESH AND EGD WITH PEG TUBE PLACEMENT;  Surgeon: Lynda Leos, MD;  Location: [facility] ORS;  Service: General;  Laterality: N/A;   LEFT HEART CATH AND CORONARY ANGIOGRAPHY N/A 08/01/2017   Procedure: LEFT HEART CATH AND CORONARY ANGIOGRAPHY;  Surgeon: Dann Candyce RAMAN, MD;  Location: [facility] INVASIVE CV LAB;  Service: Cardiovascular;  Laterality: N/A;   ORIF ELBOW FRACTURE Right 12/25/2018   ORIF HUMERUS FRACTURE Right 12/25/2018   Procedure: OPEN REDUCTION INTERNAL FIXATION (ORIF) DISTAL HUMERUS FRACTURE;  Surgeon: Celena Sharper, MD;  Location: [facility] OR;  Service: Orthopedics;  Laterality: Right;   PORT-A-CATH REMOVAL  06/2015   PORTACATH PLACEMENT Left 09/22/2014   Procedure: INSERTION PORT-A-CATH;  Surgeon: Deward Null III, MD;  Location: [facility] OR;  Service: General;  Laterality: Left;   POSTERIOR LUMBAR FUSION  2016   had 2 vertebrae fused   TONSILLECTOMY  as child   TOTAL KNEE ARTHROPLASTY Bilateral 2011,2012    Current Medications: No outpatient medications have been marked as taking for the 02/12/24 encounter (Appointment) with Daneen Damien BROCKS, NP.     Allergies:   Other, Oyster shell, Shellfish allergy, Aspirin , Penicillins, Fosamax  [alendronate ], Brilinta  [ticagrelor ], Ciprofloxacin, Colesevelam, Doxycycline, Evista [raloxifene], Fosamax  [alendronate  sodium], Lactose intolerance (gi), Latex, Naproxen, Naproxen sodium, Nitrofurantoin , Pravachol  [pravastatin ], Sulfa antibiotics, Tape, Welchol [colesevelam hcl], and Zocor [simvastatin]   Social History   Socioeconomic History   Marital status: Married    Spouse name: Not on file   Number of children: 1   Years of education: Not on  file   Highest education level: Not on file  Occupational History   Not on file  Tobacco Use   Smoking status: Never   Smokeless tobacco: Never  Vaping Use   Vaping status: Never Used  Substance and Sexual Activity   Alcohol  use: No   Drug use: No   Sexual activity: Not Currently    Comment: 1st intercourse 3 yo-2 partners  Other Topics Concern   Not on file  Social History Narrative   Lives with husband.  Does not use assist device.     Social Drivers of Health   Financial Resource Strain: Medium Risk (12/15/2021)   Overall Financial Resource Strain (CARDIA)    Difficulty of Paying Living Expenses: Somewhat hard  Food Insecurity: Food Insecurity Present (12/30/2021)   Hunger Vital Sign    Worried About Running Out of Food in the Last Year: Sometimes true    Ran Out of Food in the Last Year: Sometimes true  Transportation Needs: No Transportation Needs (12/15/2021)   PRAPARE - Administrator, Civil Service (Medical): No    Lack of Transportation (Non-Medical): No  Physical Activity: Not on file  Stress: Stress Concern Present (12/15/2021)   [employer] of Occupational Health - Occupational Stress Questionnaire    Feeling of Stress : To some extent  Social Connections: Moderately Integrated (12/15/2021)   Social Connection and Isolation Panel    Frequency of Communication with Friends and Family: More than three times a week    Frequency of Social Gatherings with Friends and Family: More than three times a week    Attends Religious Services: More than 4 times per year    Active Member of [employer] or Organizations: Yes    Attends Banker Meetings: More than 4 times per year    Marital Status: Widowed     Family History: The patient's ***family history includes Cancer in her father; Cancer (age of onset: 33) in her sister; Diabetes in her father and sister; Heart disease in her father, mother, and sister.  ROS:   Please see the history of present  illness.    *** All other systems reviewed and are negative.  EKGs/Labs/Other Studies Reviewed:    The following studies were reviewed today: ***      Recent Labs: 01/15/2024: ALT 13; BUN 7; Creatinine, Ser 0.80; Hemoglobin 13.4; Magnesium  1.8; Platelets 220; Potassium 3.5; Sodium 138  Recent Lipid Panel    Component Value Date/Time   CHOL 122 03/26/2019 1106   TRIG 66 03/26/2019 1106   HDL 66 03/26/2019 1106   CHOLHDL 1.8 03/26/2019 1106   CHOLHDL 2.6 08/01/2017 0355   VLDL 13 08/01/2017 0355   LDLCALC 42 03/26/2019 1106  ZIO monitoring 02/09/2024:  Sinus rhythm average heart rate 77 bpm   Occasional atrial tachycardia runs longest lasting 10 seconds for 144 bpm.   Rare PVCs and PACs.   No atrial fibrillation.  No pauses   Symptoms such as chest tightness were associated with sinus rhythm.     Patch Wear Time:  13 days and 10 hours (2025-09-05T09:40:31-0400 to 2025-09-18T19:59:23-0400)   Patient had a min HR of 48 bpm, max HR of 226 bpm, and avg HR of 77 bpm. Predominant underlying rhythm was Sinus Rhythm. Intermittent Bundle Branch Block was present. 1 run of Ventricular Tachycardia occurred lasting 5 beats with a max rate of 188 bpm  (avg 180 bpm). 92 Supraventricular Tachycardia runs occurred, the run with the fastest interval lasting 11 beats with a max rate of 226 bpm, the longest lasting 9.7 secs with an avg rate of 144 bpm. Isolated SVEs were rare (<1.0%), SVE Couplets were rare  (<1.0%), and SVE Triplets were rare (<1.0%). Isolated VEs were rare (<1.0%), VE Couplets were rare (<1.0%), and no VE Triplets were present.  Risk Assessment/Calculations:   {Does this patient have ATRIAL FIBRILLATION?:[phone]}  No BP recorded.  {Refresh Note OR Click here to enter BP  :1}***         Physical Exam:    VS:  There were no vitals taken for this visit.    Wt Readings from Last 3 Encounters:  01/15/24 147 lb (66.7 kg)  11/30/23 150 lb (68 kg)  08/01/23 151 lb 6.4 oz  (68.7 kg)     GEN: *** Well nourished, well developed in no acute distress HEENT: Normal NECK: No JVD; No carotid bruits LYMPHATICS: No lymphadenopathy CARDIAC: ***RRR, no murmurs, rubs, gallops RESPIRATORY:  Clear to auscultation without rales, wheezing or rhonchi  ABDOMEN: Soft, non-tender, non-distended MUSCULOSKELETAL:  No edema; No deformity  SKIN: Warm and dry NEUROLOGIC:  Alert and oriented x 3 PSYCHIATRIC:  Normal affect   ASSESSMENT:    No diagnosis found. PLAN:    In order of problems listed above:  ***      {Are you ordering a CV Procedure (e.g. stress test, cath, DCCV, TEE, etc)?   Press F2        :[id number]}    Medication Adjustments/Labs and Tests Ordered: Current medicines are reviewed at length with the patient today.  Concerns regarding medicines are outlined above.  No orders of the defined types were placed in this encounter.  No orders of the defined types were placed in this encounter.   There are no Patient Instructions on file for this visit.   Signed, Miriam FORBES Shams, NP  02/11/2024 1:49 PM    [facility]

## 2024-02-12 ENCOUNTER — Ambulatory Visit: Admitting: Nurse Practitioner

## 2024-02-13 DIAGNOSIS — Z961 Presence of intraocular lens: Secondary | ICD-10-CM | POA: Diagnosis not present

## 2024-02-13 DIAGNOSIS — H04123 Dry eye syndrome of bilateral lacrimal glands: Secondary | ICD-10-CM | POA: Diagnosis not present

## 2024-02-19 ENCOUNTER — Ambulatory Visit: Payer: Self-pay | Admitting: Cardiology

## 2024-02-20 ENCOUNTER — Ambulatory Visit: Attending: Cardiovascular Disease | Admitting: Pharmacist Clinician (PhC)/ Clinical Pharmacy Specialist

## 2024-02-20 ENCOUNTER — Encounter: Payer: Self-pay | Admitting: Pharmacist Clinician (PhC)/ Clinical Pharmacy Specialist

## 2024-02-20 DIAGNOSIS — E7800 Pure hypercholesterolemia, unspecified: Secondary | ICD-10-CM

## 2024-02-20 MED ORDER — LOVASTATIN 40 MG PO TABS: 40.0000 mg | ORAL_TABLET | Freq: Every day | ORAL | 0 refills | Status: DC

## 2024-02-20 NOTE — Patient Instructions (Addendum)
 Your Results:             Your most recent labs Goal  Total Cholesterol 197 < 200  Triglycerides 102 < 150  HDL (happy/good cholesterol) 55 > 40  LDL (lousy/bad cholesterol 124 < 55   Medication changes: Start back taking Ezetimibe  10 mg daily and lovastatin  40 mg daily at bedtime Call the office at [phone] if you have any questions/concerns  Lab orders: We want to repeat labs after 3 months.   It is also recommended that patients with high cholesterol adhere to a heart healthy diet, get regular exercise, avoid use of tobacco products, and maintain a healthy weight. Steps that you can take to help in these areas:  Limit consumption of trans fats, saturated fats, and cholesterol in your diet  Increase intake of lean meats such as chicken, [place], and fish  Increase intake of foods rich in fiber such as fresh fruits, vegetables, beans and oatmeal Exercise as you are able; even 30 minutes of walking daily can aid in increasing heart health

## 2024-02-20 NOTE — Progress Notes (Signed)
 Patient ID: LAVIDA PATCH                 DOB: Mar 22, 1946                    MRN: [id number]      HPI: Angela Bond is a 78 y.o. female patient referred to lipid clinic by Angela Rummer, NP. PMH is significant for CAD, hx of STEMI (2017), HTN, and HLD.   Patient presents in good spirits today and feeling much better than she has been a previous appointments. She states that she was recently prescribed  isosorbide  mononitrate and has been feeling a lot better since she's been taking it. She reports that she is taking a OTC med, pancreatin since creon is cost prohibitive and she's been much better than before and not experiencing any diarrhea.   Patient states that she stopped taking ezetimibe  over a year ago and lovastatin  ~ 1.5 years ago. She was told to hold until her pancreatitis is better controlled. She reports that she tolerated lovastatin  and ezetimibe  well without any side effects prior to the hold due to pancreatitis.  Reviewed options for lowering LDL cholesterol, including PCSK-9 inhibitors, bempedoic acid and inclisiran.  Discussed mechanisms of action, dosing, side effects and potential decreases in LDL cholesterol.  Also reviewed cost information and potential options for patient assistance.   Patient expresses strong preference to avoid injectable therapies at this time. Her sister had a bad reaction to Leqvio and experienced a stroke shortly after and does not want to try any injections at this time.  She has tried other statins in the past and does not wish to take any due debilitating muscle pains. Patient reports that she will see PCP for annual wellness visit and will obtain lipid panel on Monday.   Current Medications: none Intolerances: simvastatin (rash), pravastatin  (GI upset) Risk Factors: CAD, hx of STEMI, HTN, family hx of heart disease LDL goal: <55 Lipid panel (08/2023): Chol 197, Trig 102, HDL 55, LDL 124  Diet:  Avoids bacon, sausage and  hamburgers Vegetables: consumes, green leafy vegetables Snacks: blueberries, cucumbers, grapes and strawberries Beverages: sweet tea (1-2 glasses daily), water  - 32 oz/day Fast food: occasionally eats [employer]  Exercise:  - Stretches daily 10 -15 minutes per day - Walks to the mailbox and back; she is considering joining the gym     Family History:  Relation Problem Comments  Mother (Deceased) Heart disease 75s, CHF    Father (Deceased) Cancer skin  Diabetes   Heart disease d/o MI at 36    Sister (Deceased) Cancer (Age: 109) breast  Diabetes     Sister (Alive) Heart disease CABG in mid-60s    Maternal Grandmother (Deceased)   Maternal Grandfather (Deceased)   Paternal Grandmother (Deceased)   Paternal Grandfather (Deceased)     Social History:  Alcohol : never Smoking: never   Labs:  Lipid Panel     Component Value Date/Time   CHOL 122 03/26/2019 1106   TRIG 66 03/26/2019 1106   HDL 66 03/26/2019 1106   CHOLHDL 1.8 03/26/2019 1106   CHOLHDL 2.6 08/01/2017 0355   VLDL 13 08/01/2017 0355   LDLCALC 42 03/26/2019 1106   LABVLDL 14 03/26/2019 1106    Past Medical History:  Diagnosis Date   Anemia    hx   Anxiety and depression    Aortic atherosclerosis 07/31/2017   Arthritis    ~ all over my body; in the joints  Breast cancer, right (HCC) 3/16   Coronary artery disease    a.  s/p NSTEMI in June 2017 with DES to mid LAD.   Depression    Gallstones    GERD (gastroesophageal reflux disease)    H/O hiatal hernia    Headache    Heart murmur    History of blood transfusion    when taking chemo & w/one of my knee replacements (11/05/2015)   Hypercholesteremia    Hypertension    Iron  deficiency anemia 02/16/2017   Multiple bruises    Myocardial infarction (HCC)    Orthostatic hypotension    Personal history of chemotherapy    Personal history of radiation therapy    Pneumonia X 1   Shingles 5/15   hx   Sliding hiatal hernia s/p lap PEH repair 07/31/2012    Unstable angina (HCC) 01/23/2012   Unsteady gait     I fall easily   Wears dentures    partial   Wears glasses     Current Outpatient Medications on File Prior to Visit  Medication Sig Dispense Refill   PANCREATIN PO Take 1 tablet by mouth 3 (three) times daily.     ALPRAZolam  (XANAX ) 0.25 MG tablet Take 0.25 mg by mouth 2 (two) times daily as needed.     aspirin  EC 81 MG EC tablet Take 1 tablet (81 mg total) by mouth daily.     calcium  carbonate (OSCAL) 1500 (600 Ca) MG TABS tablet Take 1 tablet by mouth daily.     Cholecalciferol  (VITAMIN D ) 2000 UNITS tablet Take 2,000 Units by mouth daily.     CREON 36000-114000 units CPEP capsule 2 capsules with each meal and 1 capsule with each snack Orally with food for 90 days (Patient not taking: Reported on 02/20/2024)     escitalopram  (LEXAPRO ) 20 MG tablet Take 1 tablet (20 mg total) by mouth daily. 90 tablet 3   famotidine  (PEPCID ) 20 MG tablet Take 20 mg by mouth at bedtime.      fexofenadine (ALLEGRA) 180 MG tablet Take 180 mg by mouth daily as needed for allergies.      fluticasone  (FLONASE ) 50 MCG/ACT nasal spray Place 2 sprays into both nostrils daily. 9.9 mL 2   isosorbide  mononitrate (IMDUR ) 30 MG 24 hr tablet Take 0.5 tablets (15 mg total) by mouth daily. 45 tablet 0   Magnesium  500 MG TABS Take 500 mg by mouth at bedtime.     nitroGLYCERIN  (NITROSTAT ) 0.4 MG SL tablet Place 1 tablet (0.4 mg total) under the tongue every 5 (five) minutes as needed for chest pain (CP or SOB). 25 tablet 6   pantoprazole  (PROTONIX ) 40 MG tablet 1 tablet daily.     Polyvinyl Alcohol -Povidone PF (REFRESH) 1.4-0.6 % SOLN Place 2 drops into both eyes 3 (three) times daily as needed (for dry eyes).     tiZANidine  (ZANAFLEX ) 4 MG tablet Take 1 tablet (4 mg total) by mouth every 8 (eight) hours as needed for muscle spasms. 30 tablet 0   traMADol  (ULTRAM ) 50 MG tablet Take 1 tablet (50 mg total) by mouth every 6 (six) hours as needed. 15 tablet 0   vitamin C   (ASCORBIC ACID ) 500 MG tablet Take 500 mg by mouth daily.     No current facility-administered medications on file prior to visit.    Allergies  Allergen Reactions   Other Anaphylaxis, Nausea And Vomiting and Other (See Comments)    THROAT CLOSE, CAN'T BREATH ANAPHYLAXIS TO OYSTERS  Oyster Shell Anaphylaxis   Shellfish Allergy Anaphylaxis and Other (See Comments)    ANAPHYLAXIS TO OYSTERS   Aspirin  Other (See Comments)    Burns stomach   Penicillins Nausea And Vomiting and Other (See Comments)    PATIENT HAS HAD A PCN REACTION WITH IMMEDIATE RASH, FACIAL/TONGUE/THROAT SWELLING, SOB, OR LIGHTHEADEDNESS WITH HYPOTENSION:  #  #  YES  #  #  Has patient had a PCN reaction causing severe rash involving mucus membranes or skin necrosis: No Has patient had a PCN reaction that required hospitalization: No Has patient had a PCN reaction occurring within the last 10 years: No If all of the above answers are NO, then may proceed with Cephalosporin use.    Fosamax  [Alendronate ] Other (See Comments)   Brilinta  [Ticagrelor ] Other (See Comments)    Indigestion   Ciprofloxacin Nausea And Vomiting and Other (See Comments)   Colesevelam Other (See Comments)    Stomach cramps   Doxycycline Nausea And Vomiting   Evista [Raloxifene] Other (See Comments)    LEG ACHES   Fosamax  [Alendronate  Sodium] Other (See Comments)    Leg aches   Lactose Intolerance (Gi) Nausea And Vomiting   Latex Rash   Naproxen Nausea And Vomiting and Other (See Comments)   Naproxen Sodium Nausea And Vomiting   Nitrofurantoin  Rash and Other (See Comments)    I broke out down there   Pravachol  [Pravastatin ] Other (See Comments)    GI upset    Sulfa Antibiotics Nausea And Vomiting   Tape Rash   Welchol [Colesevelam Hcl] Other (See Comments)    Stomach cramps   Zocor [Simvastatin] Rash    Assessment/Plan:  1. Hyperlipidemia -  Problem  Hypercholesteremia   Hypercholesteremia Assessment:  LDL goal: < 55  mg/dl; last LDLc 875  mg/dl (09/7972) Pt held HLD medications while pancreatitis was uncontrolled Pt expresses that she is feeling much better, not experiencing any diarrhea as before Lipase, AST/ALT wnl Tolerated prior regimen well without any side effects Discussed next potential options (PCSK-9 inhibitors, bempedoic acid, inclisiran, statins); cost, dosing efficacy, side effects  Pt uninterested in trying any injectable medications at this time  Plan: Resume ezetimibe  10 mg daily and lovastatin  40 mg daily at bedtime Lipid lab and liver function tests due in 2-3 months after restarting cholesterol regimen;  Pt advised to call if she experiences any worsening GI symptoms after resuming HLD medications   Thank you,  Jemaine Prokop E. Avyay Coger, Pharm.D [facility]. [facility] [address], [place], [place] [address] Phone: [phone]; Fax: [phone]

## 2024-02-20 NOTE — Assessment & Plan Note (Addendum)
 Assessment:  LDL goal: < 55 mg/dl; last LDLc 875  mg/dl (09/7972) Pt held HLD medications while pancreatitis was uncontrolled Pt expresses that she is feeling much better, not experiencing any diarrhea as before Lipase, AST/ALT wnl Tolerated prior regimen well without any side effects Discussed next potential options (PCSK-9 inhibitors, bempedoic acid, inclisiran, statins); cost, dosing efficacy, side effects  Pt uninterested in trying any injectable medications at this time  Plan: Resume ezetimibe  10 mg daily and lovastatin  40 mg daily at bedtime Lipid lab due in 2-3 months after restarting cholesterol regimen Pt advised to call if she experiences any worsening GI symptoms after resuming HLD medications

## 2024-02-21 MED ORDER — METOPROLOL SUCCINATE ER 25 MG PO TB24: 25.0000 mg | ORAL_TABLET | Freq: Every day | ORAL | 3 refills | Status: AC

## 2024-02-26 ENCOUNTER — Encounter (HOSPITAL_COMMUNITY): Payer: Self-pay

## 2024-02-26 ENCOUNTER — Telehealth (HOSPITAL_COMMUNITY): Payer: Self-pay | Admitting: Emergency Medicine

## 2024-02-26 DIAGNOSIS — K219 Gastro-esophageal reflux disease without esophagitis: Secondary | ICD-10-CM | POA: Diagnosis not present

## 2024-02-26 DIAGNOSIS — I8390 Asymptomatic varicose veins of unspecified lower extremity: Secondary | ICD-10-CM | POA: Diagnosis not present

## 2024-02-26 DIAGNOSIS — I10 Essential (primary) hypertension: Secondary | ICD-10-CM | POA: Diagnosis not present

## 2024-02-26 DIAGNOSIS — Z23 Encounter for immunization: Secondary | ICD-10-CM | POA: Diagnosis not present

## 2024-02-26 DIAGNOSIS — J309 Allergic rhinitis, unspecified: Secondary | ICD-10-CM | POA: Diagnosis not present

## 2024-02-26 DIAGNOSIS — E782 Mixed hyperlipidemia: Secondary | ICD-10-CM | POA: Diagnosis not present

## 2024-02-26 DIAGNOSIS — D509 Iron deficiency anemia, unspecified: Secondary | ICD-10-CM | POA: Diagnosis not present

## 2024-02-26 DIAGNOSIS — K8689 Other specified diseases of pancreas: Secondary | ICD-10-CM | POA: Diagnosis not present

## 2024-02-26 DIAGNOSIS — I25119 Atherosclerotic heart disease of native coronary artery with unspecified angina pectoris: Secondary | ICD-10-CM | POA: Diagnosis not present

## 2024-02-26 DIAGNOSIS — Z0000 Encounter for general adult medical examination without abnormal findings: Secondary | ICD-10-CM | POA: Diagnosis not present

## 2024-02-26 NOTE — Telephone Encounter (Signed)
 Reaching out to patient to offer assistance regarding upcoming cardiac imaging study; pt verbalizes understanding of appt date/time, parking situation and where to check in, pre-test NPO status and medications ordered, and verified current allergies; name and call back number provided for further questions should they arise Rockwell Alexandria RN Navigator Cardiac Imaging [facility] [phone] office [phone] cell

## 2024-02-27 ENCOUNTER — Encounter (HOSPITAL_COMMUNITY)
Admission: RE | Admit: 2024-02-27 | Discharge: 2024-02-27 | Disposition: A | Discharge status: Home / Self Care | Source: Ambulatory Visit | Attending: Cardiology | Admitting: Cardiology

## 2024-02-27 DIAGNOSIS — I200 Unstable angina: Secondary | ICD-10-CM | POA: Diagnosis not present

## 2024-02-27 LAB — NM PET CT CARDIAC PERFUSION MULTI W/ABSOLUTE BLOODFLOW
LV dias vol: 74 mL (ref 46–106)
MBFR: 2.54
Nuc Rest EF: 55 %
Nuc Stress EF: 69 %
Peak HR: 99 {beats}/min
Rest HR: 58 {beats}/min
Rest MBF: 0.94 ml/g/min
Rest Nuclear Isotope Dose: 18.1 mCi
ST Depression (mm): 0 mm
Stress MBF: 2.39 ml/g/min
Stress Nuclear Isotope Dose: 18 mCi
TID: 0.98

## 2024-02-27 MED ORDER — CAFFEINE CITRATE BASE COMPONENT 10 MG/ML IV SOLN
INTRAVENOUS | Status: AC
Start: 2024-02-27 — End: 2024-02-27
  Filled 2024-02-27: qty 3

## 2024-02-27 MED ORDER — RUBIDIUM RB82 GENERATOR (RUBYFILL)
18.1000 | PACK | Freq: Once | INTRAVENOUS | Status: AC
Start: 2024-02-27 — End: 2024-02-27
  Administered 2024-02-27: 18.1 via INTRAVENOUS

## 2024-02-27 MED ORDER — RUBIDIUM RB82 GENERATOR (RUBYFILL)
18.0400 | PACK | Freq: Once | INTRAVENOUS | Status: AC
  Administered 2024-02-27: 18.04 via INTRAVENOUS

## 2024-02-27 MED ORDER — REGADENOSON 0.4 MG/5ML IV SOLN
0.4000 mg | Freq: Once | INTRAVENOUS | Status: AC
  Administered 2024-02-27: 0.4 mg via INTRAVENOUS

## 2024-02-27 MED ORDER — REGADENOSON 0.4 MG/5ML IV SOLN
INTRAVENOUS | Status: AC
Start: 2024-02-27 — End: 2024-02-27
  Filled 2024-02-27: qty 5

## 2024-02-27 NOTE — Progress Notes (Signed)
 Pt. Tolerated lexi scan well.

## 2024-03-12 ENCOUNTER — Ambulatory Visit: Admitting: Nurse Practitioner

## 2024-03-12 NOTE — Progress Notes (Unsigned)
 Cardiology Office Note:    Date:  03/13/2024   ID:  Angela Bond, DOB 1946/04/11, MRN [id number]  PCP:  Auston Opal, DO   [facility] Providers Cardiologist:  Oneil Parchment, MD     Referring MD: Auston Opal, DO   Chief Complaint  Patient presents with   Follow-up    DOE    History of Present Illness:    Angela Bond is a 78 y.o. female with a hx of CAD, NSTEMI 2017, hypertension, hyperlipidemia, breast cancer.  LHC 2017 showed 30% proximal to mid RCA stenosis, 99% mid LAD stenosis, 35% D2 stenosis.  LAD stenosis successfully treated with DES 3.0 x 16 mm.  The diagonal branch was jailed.  She underwent repeat LHC in 2019 which showed patent LAD stent but ostial D1 lesion with a 75% stenosis, jailed by prior stent.   She was hospitalized 01/15/2024 with palpitations and chest pressure.  She ruled out with negative cardiac enzymes.  She was started on 15 mg Imdur  and outpatient workup was pursued.  Heart monitor 01/2024 showed occasional atrial tachycardia, beta-blocker was recommended.  She also reported chest tightness and underwent PET stress test 02/27/2024 that showed a small mild reversible defect in the mid to apical segment consistent with ischemia.  Normal MBF are in this region was reassuring, no evidence of infarction.  This was read as a low risk study.  No further plans for angiography at this time.  She presents for routine cardiology follow-up. She is very concerned about an episode of DOE yesterday precipitated EMS dispatch, improved with rest, did not go to the ER. Her sister died last Apr 12, 2024. She also reports chest tightness since her sister has passed. With further discussion, she reports chest tightness prior to her sister's death. She also has allergies.  Has not had an echo since 2019. She is a never smoker.   Past Medical History:  Diagnosis Date   Anemia    hx   Anxiety and depression    Aortic atherosclerosis 07/31/2017   Arthritis    ~ all  over my body; in the joints   Breast cancer, right (HCC) 3/16   Coronary artery disease    a.  s/p NSTEMI in June 2017 with DES to mid LAD.   Depression    Gallstones    GERD (gastroesophageal reflux disease)    H/O hiatal hernia    Headache    Heart murmur    History of blood transfusion    when taking chemo & w/one of my knee replacements (11/05/2015)   Hypercholesteremia    Hypertension    Iron  deficiency anemia 02/16/2017   Multiple bruises    Myocardial infarction (HCC)    Orthostatic hypotension    Personal history of chemotherapy    Personal history of radiation therapy    Pneumonia X 1   Shingles 5/15   hx   Sliding hiatal hernia s/p lap PEH repair 07/31/2012   Unstable angina (HCC) 01/23/2012   Unsteady gait     I fall easily   Wears dentures    partial   Wears glasses     Past Surgical History:  Procedure Laterality Date   BREAST BIOPSY Right    BREAST LUMPECTOMY Right 08/2014   BREAST LUMPECTOMY WITH SENTINEL LYMPH NODE BIOPSY Right 09/22/2014   Procedure: RIGHT BREAST LUMPECTOMY WITH NEEDLE LOCALIZATION AND RIGHT AXILLARY SENTINEL LYMPH NODE BIOPSY;  Surgeon: Deward Null III, MD;  Location: [facility] OR;  Service: General;  Laterality: Right;   CARDIAC CATHETERIZATION     CARDIAC CATHETERIZATION N/A 11/05/2015   Procedure: Left Heart Cath and Coronary Angiography;  Surgeon: Victory LELON Sharps, MD;  Location: [facility] INVASIVE CV LAB;  Service: Cardiovascular;  Laterality: N/A;   CARDIAC CATHETERIZATION N/A 11/05/2015   Procedure: Coronary Stent Intervention;  Surgeon: Victory LELON Sharps, MD;  Location: [facility] INVASIVE CV LAB;  Service: Cardiovascular;  Laterality: N/A;   CARPAL TUNNEL RELEASE Right 09/2015   CHOLECYSTECTOMY N/A 03/28/2018   Procedure: LAPAROSCOPIC CHOLECYSTECTOMY WITH INTRAOPERATIVE CHOLANGIOGRAM ERAS PATHWAY;  Surgeon: Curvin Deward MOULD, MD;  Location: [facility] OR;  Service: General;  Laterality: N/A;   CORONARY ANGIOPLASTY WITH STENT PLACEMENT  11/05/2015   1 stent   ESOPHAGEAL  MANOMETRY N/A 07/02/2012   Procedure: ESOPHAGEAL MANOMETRY (EM);  Surgeon: Jerrell KYM Sol, MD;  Location: [facility] ENDOSCOPY;  Service: Endoscopy;  Laterality: N/A;  schooler to read   EYE SURGERY Left 2013   tube placed tear duct   HIATAL HERNIA REPAIR N/A 08/10/2012   Procedure: LAPAROSCOPIC REPAIR OF HIATAL HERNIA WITH MESH AND EGD WITH PEG TUBE PLACEMENT;  Surgeon: Lynda Leos, MD;  Location: [facility] ORS;  Service: General;  Laterality: N/A;   LEFT HEART CATH AND CORONARY ANGIOGRAPHY N/A 08/01/2017   Procedure: LEFT HEART CATH AND CORONARY ANGIOGRAPHY;  Surgeon: Dann Candyce RAMAN, MD;  Location: [facility] INVASIVE CV LAB;  Service: Cardiovascular;  Laterality: N/A;   ORIF ELBOW FRACTURE Right 12/25/2018   ORIF HUMERUS FRACTURE Right 12/25/2018   Procedure: OPEN REDUCTION INTERNAL FIXATION (ORIF) DISTAL HUMERUS FRACTURE;  Surgeon: Celena Sharper, MD;  Location: [facility] OR;  Service: Orthopedics;  Laterality: Right;   PORT-A-CATH REMOVAL  06/2015   PORTACATH PLACEMENT Left 09/22/2014   Procedure: INSERTION PORT-A-CATH;  Surgeon: Deward Curvin III, MD;  Location: [facility] OR;  Service: General;  Laterality: Left;   POSTERIOR LUMBAR FUSION  2016   had 2 vertebrae fused   TONSILLECTOMY  as child   TOTAL KNEE ARTHROPLASTY Bilateral 2011,2012    Current Medications: Current Meds  Medication Sig   ALPRAZolam  (XANAX ) 0.25 MG tablet Take 0.25 mg by mouth 2 (two) times daily as needed.   aspirin  EC 81 MG EC tablet Take 1 tablet (81 mg total) by mouth daily.   calcium  carbonate (OSCAL) 1500 (600 Ca) MG TABS tablet Take 1 tablet by mouth daily.   Cholecalciferol  (VITAMIN D ) 2000 UNITS tablet Take 2,000 Units by mouth daily.   escitalopram  (LEXAPRO ) 20 MG tablet Take 1 tablet (20 mg total) by mouth daily.   famotidine  (PEPCID ) 20 MG tablet Take 20 mg by mouth at bedtime.    fexofenadine (ALLEGRA) 180 MG tablet Take 180 mg by mouth daily as needed for allergies.    fluticasone  (FLONASE ) 50 MCG/ACT nasal spray Place 2  sprays into both nostrils daily.   isosorbide  mononitrate (IMDUR ) 30 MG 24 hr tablet Take 0.5 tablets (15 mg total) by mouth daily.   Magnesium  500 MG TABS Take 500 mg by mouth at bedtime.   metoprolol  succinate (TOPROL  XL) 25 MG 24 hr tablet Take 1 tablet (25 mg total) by mouth daily.   nitroGLYCERIN  (NITROSTAT ) 0.4 MG SL tablet Place 1 tablet (0.4 mg total) under the tongue every 5 (five) minutes as needed for chest pain (CP or SOB).   PANCREATIN PO Take 1 tablet by mouth 3 (three) times daily.   pantoprazole  (PROTONIX ) 40 MG tablet 1 tablet daily.   Polyvinyl Alcohol -Povidone PF (REFRESH) 1.4-0.6 % SOLN Place 2 drops into both  eyes 3 (three) times daily as needed (for dry eyes).   tiZANidine  (ZANAFLEX ) 4 MG tablet Take 1 tablet (4 mg total) by mouth every 8 (eight) hours as needed for muscle spasms.   traMADol  (ULTRAM ) 50 MG tablet Take 1 tablet (50 mg total) by mouth every 6 (six) hours as needed.   vitamin C  (ASCORBIC ACID ) 500 MG tablet Take 500 mg by mouth daily.   [DISCONTINUED] lovastatin  (MEVACOR ) 40 MG tablet Take 1 tablet (40 mg total) by mouth at bedtime.     Allergies:   Other, Oyster shell, Shellfish allergy, Aspirin , Penicillins, Fosamax  [alendronate ], Iodine, Brilinta  [ticagrelor ], Ciprofloxacin, Colesevelam, Doxycycline, Evista [raloxifene], Fosamax  [alendronate  sodium], Lactose intolerance (gi), Latex, Naproxen, Naproxen sodium, Nitrofurantoin , Pravachol  [pravastatin ], Sulfa antibiotics, Tape, Welchol [colesevelam hcl], and Zocor [simvastatin]   Social History   Socioeconomic History   Marital status: Married    Spouse name: Not on file   Number of children: 1   Years of education: Not on file   Highest education level: Not on file  Occupational History   Not on file  Tobacco Use   Smoking status: Never   Smokeless tobacco: Never  Vaping Use   Vaping status: Never Used  Substance and Sexual Activity   Alcohol  use: No   Drug use: No   Sexual activity: Not Currently     Comment: 1st intercourse 3 yo-2 partners  Other Topics Concern   Not on file  Social History Narrative   Lives with husband.  Does not use assist device.     Social Drivers of Health   Financial Resource Strain: Medium Risk (12/15/2021)   Overall Financial Resource Strain (CARDIA)    Difficulty of Paying Living Expenses: Somewhat hard  Food Insecurity: Food Insecurity Present (12/30/2021)   Hunger Vital Sign    Worried About Running Out of Food in the Last Year: Sometimes true    Ran Out of Food in the Last Year: Sometimes true  Transportation Needs: No Transportation Needs (12/15/2021)   PRAPARE - Administrator, Civil Service (Medical): No    Lack of Transportation (Non-Medical): No  Physical Activity: Not on file  Stress: Stress Concern Present (12/15/2021)   [employer] of Occupational Health - Occupational Stress Questionnaire    Feeling of Stress : To some extent  Social Connections: Moderately Integrated (12/15/2021)   Social Connection and Isolation Panel    Frequency of Communication with Friends and Family: More than three times a week    Frequency of Social Gatherings with Friends and Family: More than three times a week    Attends Religious Services: More than 4 times per year    Active Member of [employer] or Organizations: Yes    Attends Banker Meetings: More than 4 times per year    Marital Status: Widowed     Family History: The patient's family history includes Cancer in her father; Cancer (age of onset: 6) in her sister; Diabetes in her father and sister; Heart disease in her father, mother, and sister.  ROS:   Please see the history of present illness.     All other systems reviewed and are negative.  EKGs/Labs/Other Studies Reviewed:    The following studies were reviewed today:       Recent Labs: 01/15/2024: ALT 13; BUN 7; Creatinine, Ser 0.80; Hemoglobin 13.4; Magnesium  1.8; Platelets 220; Potassium 3.5; Sodium 138  Recent  Lipid Panel    Component Value Date/Time   CHOL 122 03/26/2019 1106  TRIG 66 03/26/2019 1106   HDL 66 03/26/2019 1106   CHOLHDL 1.8 03/26/2019 1106   CHOLHDL 2.6 08/01/2017 0355   VLDL 13 08/01/2017 0355   LDLCALC 42 03/26/2019 1106     Risk Assessment/Calculations:          Physical Exam:    VS:  BP (!) 140/84 (BP Location: Left Arm, Patient Position: Sitting, Cuff Size: Large)   Pulse 68   Ht 4' 8 (1.422 m)   Wt 151 lb (68.5 kg)   SpO2 96%   BMI 33.85 kg/m     Wt Readings from Last 3 Encounters:  03/13/24 151 lb (68.5 kg)  01/15/24 147 lb (66.7 kg)  11/30/23 150 lb (68 kg)     GEN:  Well nourished, well developed in no acute distress HEENT: Normal NECK: No JVD; No carotid bruits LYMPHATICS: No lymphadenopathy CARDIAC: RRR, soft systolic murmur at RSB RESPIRATORY:  Clear to auscultation without rales, wheezing or rhonchi  ABDOMEN: Soft, non-tender, non-distended MUSCULOSKELETAL:  B LE edema, R > L  SKIN: Warm and dry NEUROLOGIC:  Alert and oriented x 3 PSYCHIATRIC:  Normal affect   ASSESSMENT:    1. CAD S/P percutaneous coronary angioplasty   2. Hypercholesteremia   3. DOE (dyspnea on exertion)   4. Paroxysmal atrial tachycardia   5. Palpitations   6. Swelling of lower extremity   7. Unstable angina pectoris (HCC)   8. SOB (shortness of breath)    PLAN:    In order of problems listed above:  DOE - she appears euvolemic - mild cardiac murmur on exam - will obtain stat echo given recent stressor to rule out stress CM - if unremarkable, will refer to pulmonology, but suspect a component of deconditioning   CAD NSTEMI with DES-LAD, jailed diagonal in 2017 -Last heart catheterization in 2019 reassuring - PET stress test considered low risk -Continue aspirin , lovastatin , beta-blocker, Imdur    Palpitations Paroxysmal atrial tachycardia Started on Toprol  25 mg at night - this has improved her palpitations   Hyperlipidemia with LDL less than  70 - Maintained on lovastatin  40 mg   Unilateral LE swelling - recent CTA negative for PE - significant swelling on R LE - will rule out DVT   Follow up in 1 month with Dr. Jeffrie.   Her sister followed with Dr. Ladona.      Medication Adjustments/Labs and Tests Ordered: Current medicines are reviewed at length with the patient today.  Concerns regarding medicines are outlined above.  Orders Placed This Encounter  Procedures   ECHOCARDIOGRAM COMPLETE   VAS US  LOWER EXTREMITY VENOUS (DVT)   Meds ordered this encounter  Medications   lovastatin  (MEVACOR ) 40 MG tablet    Sig: Take 1 tablet (40 mg total) by mouth at bedtime.    Dispense:  90 tablet    Refill:  3    Patient Instructions  Medication Instructions:  Your physician recommends that you continue on your current medications as directed. Please refer to the Current Medication list given to you today.  *If you need a refill on your cardiac medications before your next appointment, please call your pharmacy*  Lab Work: NONE If you have labs (blood work) drawn today and your tests are completely normal, you will receive your results only by: MyChart Message (if you have MyChart) OR A paper copy in the mail If you have any lab test that is abnormal or we need to change your treatment, we will call you to review  the results.  Testing/Procedures: Your physician has requested that you have an echocardiogram. Echocardiography is a painless test that uses sound waves to create images of your heart. It provides your doctor with information about the size and shape of your heart and how well your heart's chambers and valves are working. This procedure takes approximately one hour. There are no restrictions for this procedure. Please do NOT wear cologne, perfume, aftershave, or lotions (deodorant is allowed). Please arrive 15 minutes prior to your appointment time.  Please note: We ask at that you not bring children with you  during ultrasound (echo/ vascular) testing. Due to room size and safety concerns, children are not allowed in the ultrasound rooms during exams. Our front office staff cannot provide observation of children in our lobby area while testing is being conducted. An adult accompanying a patient to their appointment will only be allowed in the ultrasound room at the discretion of the ultrasound technician under special circumstances. We apologize for any inconvenience.  Your physician has requested that you have a lower or upper extremity venous duplex. This test is an ultrasound of the veins in the legs or arms. It looks at venous blood flow that carries blood from the heart to the legs or arms. Allow one hour for a Lower Venous exam. Allow thirty minutes for an Upper Venous exam. There are no restrictions or special instructions.  Please note: We ask at that you not bring children with you during ultrasound (echo/ vascular) testing. Due to room size and safety concerns, children are not allowed in the ultrasound rooms during exams. Our front office staff cannot provide observation of children in our lobby area while testing is being conducted. An adult accompanying a patient to their appointment will only be allowed in the ultrasound room at the discretion of the ultrasound technician under special circumstances. We apologize for any inconvenience.   Follow-Up: At [facility], you and your health needs are our priority.  As part of our continuing mission to provide you with exceptional heart care, our providers are all part of one team.  This team includes your primary Cardiologist (physician) and Advanced Practice Providers or APPs (Physician Assistants and Nurse Practitioners) who all work together to provide you with the care you need, when you need it.  Your next appointment:   1 month(s)  Provider:   Oneil Parchment, MD   We recommend signing up for the patient portal called MyChart.  Sign up  information is provided on this After Visit Summary.  MyChart is used to connect with patients for Virtual Visits (Telemedicine).  Patients are able to view lab/test results, encounter notes, upcoming appointments, etc.  Non-urgent messages can be sent to your provider as well.   To learn more about what you can do with MyChart, go to [email or web address].            Signed, Jon Nat Hails, [place]  03/13/2024 3:57 PM    [facility]

## 2024-03-13 ENCOUNTER — Ambulatory Visit: Attending: Physician Assistant | Admitting: Physician Assistant

## 2024-03-13 ENCOUNTER — Encounter: Payer: Self-pay | Admitting: Physician Assistant

## 2024-03-13 VITALS — BP 140/84 | HR 68 | Ht <= 58 in | Wt 151.0 lb

## 2024-03-13 DIAGNOSIS — R0609 Other forms of dyspnea: Secondary | ICD-10-CM | POA: Diagnosis not present

## 2024-03-13 DIAGNOSIS — Z9861 Coronary angioplasty status: Secondary | ICD-10-CM

## 2024-03-13 DIAGNOSIS — R0602 Shortness of breath: Secondary | ICD-10-CM | POA: Diagnosis not present

## 2024-03-13 DIAGNOSIS — I4719 Other supraventricular tachycardia: Secondary | ICD-10-CM

## 2024-03-13 DIAGNOSIS — I2510 Atherosclerotic heart disease of native coronary artery without angina pectoris: Secondary | ICD-10-CM | POA: Diagnosis not present

## 2024-03-13 DIAGNOSIS — E7800 Pure hypercholesterolemia, unspecified: Secondary | ICD-10-CM

## 2024-03-13 DIAGNOSIS — R002 Palpitations: Secondary | ICD-10-CM | POA: Diagnosis not present

## 2024-03-13 DIAGNOSIS — M7989 Other specified soft tissue disorders: Secondary | ICD-10-CM | POA: Diagnosis not present

## 2024-03-13 DIAGNOSIS — I200 Unstable angina: Secondary | ICD-10-CM

## 2024-03-13 MED ORDER — LOVASTATIN 40 MG PO TABS: 40.0000 mg | ORAL_TABLET | Freq: Every day | ORAL | 3 refills | Status: AC

## 2024-03-13 NOTE — Patient Instructions (Signed)
 Medication Instructions:  Your physician recommends that you continue on your current medications as directed. Please refer to the Current Medication list given to you today.  *If you need a refill on your cardiac medications before your next appointment, please call your pharmacy*  Lab Work: NONE If you have labs (blood work) drawn today and your tests are completely normal, you will receive your results only by: MyChart Message (if you have MyChart) OR A paper copy in the mail If you have any lab test that is abnormal or we need to change your treatment, we will call you to review the results.  Testing/Procedures: Your physician has requested that you have an echocardiogram. Echocardiography is a painless test that uses sound waves to create images of your heart. It provides your doctor with information about the size and shape of your heart and how well your heart's chambers and valves are working. This procedure takes approximately one hour. There are no restrictions for this procedure. Please do NOT wear cologne, perfume, aftershave, or lotions (deodorant is allowed). Please arrive 15 minutes prior to your appointment time.  Please note: We ask at that you not bring children with you during ultrasound (echo/ vascular) testing. Due to room size and safety concerns, children are not allowed in the ultrasound rooms during exams. Our front office staff cannot provide observation of children in our lobby area while testing is being conducted. An adult accompanying a patient to their appointment will only be allowed in the ultrasound room at the discretion of the ultrasound technician under special circumstances. We apologize for any inconvenience.  Your physician has requested that you have a lower or upper extremity venous duplex. This test is an ultrasound of the veins in the legs or arms. It looks at venous blood flow that carries blood from the heart to the legs or arms. Allow one hour for a  Lower Venous exam. Allow thirty minutes for an Upper Venous exam. There are no restrictions or special instructions.  Please note: We ask at that you not bring children with you during ultrasound (echo/ vascular) testing. Due to room size and safety concerns, children are not allowed in the ultrasound rooms during exams. Our front office staff cannot provide observation of children in our lobby area while testing is being conducted. An adult accompanying a patient to their appointment will only be allowed in the ultrasound room at the discretion of the ultrasound technician under special circumstances. We apologize for any inconvenience.   Follow-Up: At [facility], you and your health needs are our priority.  As part of our continuing mission to provide you with exceptional heart care, our providers are all part of one team.  This team includes your primary Cardiologist (physician) and Advanced Practice Providers or APPs (Physician Assistants and Nurse Practitioners) who all work together to provide you with the care you need, when you need it.  Your next appointment:   1 month(s)  Provider:   Oneil Parchment, MD   We recommend signing up for the patient portal called MyChart.  Sign up information is provided on this After Visit Summary.  MyChart is used to connect with patients for Virtual Visits (Telemedicine).  Patients are able to view lab/test results, encounter notes, upcoming appointments, etc.  Non-urgent messages can be sent to your provider as well.   To learn more about what you can do with MyChart, go to [email or web address].

## 2024-03-14 ENCOUNTER — Ambulatory Visit (HOSPITAL_COMMUNITY)
Admission: RE | Admit: 2024-03-14 | Discharge: 2024-03-14 | Disposition: A | Discharge status: Home / Self Care | Source: Ambulatory Visit | Attending: Physician Assistant | Admitting: Physician Assistant

## 2024-03-14 DIAGNOSIS — R0609 Other forms of dyspnea: Secondary | ICD-10-CM | POA: Diagnosis not present

## 2024-03-14 DIAGNOSIS — I200 Unstable angina: Secondary | ICD-10-CM | POA: Diagnosis not present

## 2024-03-14 DIAGNOSIS — R011 Cardiac murmur, unspecified: Secondary | ICD-10-CM | POA: Insufficient documentation

## 2024-03-14 DIAGNOSIS — I080 Rheumatic disorders of both mitral and aortic valves: Secondary | ICD-10-CM | POA: Insufficient documentation

## 2024-03-14 DIAGNOSIS — R079 Chest pain, unspecified: Secondary | ICD-10-CM | POA: Insufficient documentation

## 2024-03-14 DIAGNOSIS — R0602 Shortness of breath: Secondary | ICD-10-CM | POA: Insufficient documentation

## 2024-03-14 DIAGNOSIS — I252 Old myocardial infarction: Secondary | ICD-10-CM | POA: Diagnosis not present

## 2024-03-14 DIAGNOSIS — I2510 Atherosclerotic heart disease of native coronary artery without angina pectoris: Secondary | ICD-10-CM | POA: Diagnosis not present

## 2024-03-14 LAB — ECHOCARDIOGRAM COMPLETE
Area-P 1/2: 4.49 cm2
MV M vel: 5.21 m/s
MV Peak grad: 108.6 mmHg
S' Lateral: 2.4 cm

## 2024-03-14 NOTE — Progress Notes (Signed)
  Echocardiogram 2D Echocardiogram has been performed.  Koleen KANDICE Popper, RDCS 03/14/2024, 8:36 AM

## 2024-03-18 ENCOUNTER — Ambulatory Visit (HOSPITAL_COMMUNITY)
Admission: RE | Admit: 2024-03-18 | Discharge: 2024-03-18 | Disposition: A | Discharge status: Home / Self Care | Source: Ambulatory Visit | Attending: Physician Assistant | Admitting: Physician Assistant

## 2024-03-18 DIAGNOSIS — R0602 Shortness of breath: Secondary | ICD-10-CM | POA: Insufficient documentation

## 2024-03-18 DIAGNOSIS — M7989 Other specified soft tissue disorders: Secondary | ICD-10-CM | POA: Diagnosis not present

## 2024-03-22 ENCOUNTER — Ambulatory Visit: Payer: Self-pay | Admitting: Physician Assistant

## 2024-04-04 ENCOUNTER — Other Ambulatory Visit: Payer: Self-pay | Admitting: Cardiology

## 2024-04-10 ENCOUNTER — Telehealth: Payer: Self-pay | Admitting: Pharmacist Clinician (PhC)/ Clinical Pharmacy Specialist

## 2024-04-10 NOTE — Telephone Encounter (Signed)
 Labs ordered.

## 2024-05-22 ENCOUNTER — Other Ambulatory Visit: Payer: Self-pay | Admitting: Family Medicine

## 2024-05-22 DIAGNOSIS — Z1231 Encounter for screening mammogram for malignant neoplasm of breast: Secondary | ICD-10-CM

## 2024-05-23 LAB — LIPID PANEL
Chol/HDL Ratio: 2.1 ratio (ref 0.0–4.4)
Cholesterol, Total: 135 mg/dL (ref 100–199)
HDL: 63 mg/dL
LDL Chol Calc (NIH): 55 mg/dL (ref 0–99)
Triglycerides: 87 mg/dL (ref 0–149)
VLDL Cholesterol Cal: 17 mg/dL (ref 5–40)

## 2024-05-23 LAB — HEPATIC FUNCTION PANEL
ALT: 10 IU/L (ref 0–32)
AST: 16 IU/L (ref 0–40)
Albumin: 4 g/dL (ref 3.8–4.8)
Alkaline Phosphatase: 71 IU/L (ref 49–135)
Bilirubin Total: 0.5 mg/dL (ref 0.0–1.2)
Bilirubin, Direct: 0.17 mg/dL (ref 0.00–0.40)
Total Protein: 7.1 g/dL (ref 6.0–8.5)

## 2024-05-24 ENCOUNTER — Ambulatory Visit: Payer: Self-pay | Admitting: Pharmacist Clinician (PhC)/ Clinical Pharmacy Specialist

## 2024-05-28 ENCOUNTER — Ambulatory Visit
Admission: RE | Admit: 2024-05-28 | Discharge: 2024-05-28 | Disposition: A | Source: Ambulatory Visit | Attending: Family Medicine | Admitting: Family Medicine

## 2024-05-28 DIAGNOSIS — Z1231 Encounter for screening mammogram for malignant neoplasm of breast: Secondary | ICD-10-CM

## 2024-06-07 ENCOUNTER — Ambulatory Visit: Attending: Cardiology | Admitting: Cardiology

## 2024-06-07 VITALS — BP 108/58 | HR 60 | Ht <= 58 in | Wt 156.0 lb

## 2024-06-07 DIAGNOSIS — I2510 Atherosclerotic heart disease of native coronary artery without angina pectoris: Secondary | ICD-10-CM

## 2024-06-07 DIAGNOSIS — M7989 Other specified soft tissue disorders: Secondary | ICD-10-CM

## 2024-06-07 NOTE — Patient Instructions (Signed)

## 2024-06-07 NOTE — Progress Notes (Signed)
 " Cardiology Office Note:  .   Date:  06/07/2024  ID:  Angela Bond, DOB [birth year], MRN [id number] PCP: Angela Opal, DO  [facility] Providers Cardiologist:  Oneil Parchment, MD     History of Present Illness: .   Angela Bond is a 79 y.o. female Discussed the use of AI scribe software for clinical note transcription with the patient, who gave verbal consent to proceed.  History of Present Illness Angela Bond is a 79 year old female with coronary artery disease who presents for follow-up of her cardiovascular health.  Dizziness and hypotension - Experiences dizziness, attributed to low blood pressure readings. - Recent blood pressure measured at 108/58 mmHg, decreased from usual 140/80 mmHg. - Currently taking two antihypertensive medications.  Coronary artery disease and ischemia - History of non-ST elevation myocardial infarction in 2017. - Stents placed in left anterior descending artery and diagonal artery. - October PET stress test indicated low-risk study with a small area of ischemia in the midapical segments.  Peripheral edema - Unilateral leg swelling managed with Lasix  and potassium as needed. - Venous ultrasound in November 2025 showed no evidence of deep vein thrombosis.  Valvular heart disease - Echocardiogram demonstrated mild mitral valve regurgitation.  Lipid management and renal function - Diet rich in vegetables and low in salt. - LDL cholesterol level is 55 mg/dL. - Creatinine level is 0.8 mg/dL. - Hemoglobin is 13.4 g/dL.  Glycemic status - Family history of diabetes in both sisters, father, and grandmother. - A1c is 5.6%, indicating absence of diabetes.  Pancreatic insufficiency and diarrhea - History of pancreatitis. - Experiences frequent diarrhea. - Takes Creon enzyme supplement, but finds it expensive and is seeking financial assistance.  Visual function - Recent cataract surgery has improved vision.  Physical activity and  social support - Lives alone with a supportive community, including a helpful neighbor. - Engages in regular physical activity, including home exercises with three-pound weights and walking. - Has access to [facility] for additional exercise options. - Participates in creative activities such as the [employer], which she finds fulfilling.      Studies Reviewed: .        Results Labs Liver function panel: Within normal limits LDL: 55 Creatinine: 0.8 Hemoglobin: 13.4; no anemia Hemoglobin A1c: 5.6; not diabetic  Diagnostic Lower extremity venous duplex ultrasound (03/2024): No deep vein thrombosis Echocardiogram: Normal left ventricular systolic function, mild diastolic dysfunction, mild mitral regurgitation Cardiac PET stress (02/2024): Low risk; small area of ischemia in midapical segments Risk Assessment/Calculations:            Physical Exam:   VS:  BP (!) 108/58 (BP Location: Left Arm, Patient Position: Sitting, Cuff Size: Large)   Pulse 60   Ht 4' 8 (1.422 m)   Wt 156 lb (70.8 kg)   SpO2 97%   BMI 34.97 kg/m    Wt Readings from Last 3 Encounters:  06/07/24 156 lb (70.8 kg)  03/13/24 151 lb (68.5 kg)  01/15/24 147 lb (66.7 kg)    GEN: Well nourished, well developed in no acute distress NECK: No JVD; No carotid bruits CARDIAC: RRR, no murmurs, no rubs, no gallops RESPIRATORY:  Clear to auscultation without rales, wheezing or rhonchi  ABDOMEN: Soft, non-tender, non-distended EXTREMITIES:  No edema; No deformity   ASSESSMENT AND PLAN: .    Assessment and Plan Assessment & Plan Coronary artery disease Non-STEMI in 2017. Recent PET stress test in October showed low risk with a  small area of ischemia in the midapical segments. No immediate intervention required. - Continue current management and monitoring.  Hypertension Blood pressure today was 108/58, lower than previous readings. Reports dizziness, which may be related to low blood pressure. Currently on  two antihypertensive medications. - Monitor blood pressure and symptoms of dizziness.  Hyperlipidemia LDL cholesterol is well-controlled at 55 mg/dL. - Continue current lipid-lowering therapy.  Chronic pancreatitis Experiencing diarrhea and using Creon, which is expensive. Considering financial assistance options. - Continue Creon as prescribed. - Explore financial assistance options for Creon.  Mitral valve regurgitation, mild Mild mitral valve regurgitation noted on echocardiogram. No significant issues anticipated. - Continue monitoring.         Dispo: 1 yr  Signed, Oneil Parchment, MD  "
